# Patient Record
Sex: Male | Born: 1955 | Race: Black or African American | Hispanic: No | Marital: Married | Smoking: Never smoker
Health system: Southern US, Community
[De-identification: ages and names within clinical notes are randomized; demographics above are authoritative.]

## PROBLEM LIST (undated history)

## (undated) DIAGNOSIS — I739 Peripheral vascular disease, unspecified: Secondary | ICD-10-CM

## (undated) DIAGNOSIS — Z9119 Patient's noncompliance with other medical treatment and regimen: Secondary | ICD-10-CM

## (undated) DIAGNOSIS — J189 Pneumonia, unspecified organism: Secondary | ICD-10-CM

## (undated) DIAGNOSIS — M1990 Unspecified osteoarthritis, unspecified site: Secondary | ICD-10-CM

## (undated) DIAGNOSIS — N189 Chronic kidney disease, unspecified: Secondary | ICD-10-CM

## (undated) DIAGNOSIS — E119 Type 2 diabetes mellitus without complications: Secondary | ICD-10-CM

## (undated) DIAGNOSIS — G510 Bell's palsy: Secondary | ICD-10-CM

## (undated) DIAGNOSIS — D649 Anemia, unspecified: Secondary | ICD-10-CM

## (undated) DIAGNOSIS — M069 Rheumatoid arthritis, unspecified: Secondary | ICD-10-CM

## (undated) DIAGNOSIS — Z8673 Personal history of transient ischemic attack (TIA), and cerebral infarction without residual deficits: Secondary | ICD-10-CM

## (undated) DIAGNOSIS — H7202 Central perforation of tympanic membrane, left ear: Secondary | ICD-10-CM

## (undated) DIAGNOSIS — I5032 Chronic diastolic (congestive) heart failure: Secondary | ICD-10-CM

## (undated) DIAGNOSIS — I2510 Atherosclerotic heart disease of native coronary artery without angina pectoris: Secondary | ICD-10-CM

## (undated) DIAGNOSIS — N183 Chronic kidney disease, stage 3 unspecified: Secondary | ICD-10-CM

## (undated) DIAGNOSIS — I10 Essential (primary) hypertension: Secondary | ICD-10-CM

## (undated) DIAGNOSIS — Z8619 Personal history of other infectious and parasitic diseases: Secondary | ICD-10-CM

## (undated) DIAGNOSIS — C801 Malignant (primary) neoplasm, unspecified: Secondary | ICD-10-CM

## (undated) DIAGNOSIS — I420 Dilated cardiomyopathy: Secondary | ICD-10-CM

## (undated) DIAGNOSIS — Z91199 Patient's noncompliance with other medical treatment and regimen due to unspecified reason: Secondary | ICD-10-CM

## (undated) DIAGNOSIS — N401 Enlarged prostate with lower urinary tract symptoms: Secondary | ICD-10-CM

## (undated) DIAGNOSIS — E785 Hyperlipidemia, unspecified: Secondary | ICD-10-CM

## (undated) DIAGNOSIS — Z973 Presence of spectacles and contact lenses: Secondary | ICD-10-CM

## (undated) DIAGNOSIS — I509 Heart failure, unspecified: Secondary | ICD-10-CM

## (undated) DIAGNOSIS — Z9989 Dependence on other enabling machines and devices: Secondary | ICD-10-CM

## (undated) DIAGNOSIS — I259 Chronic ischemic heart disease, unspecified: Secondary | ICD-10-CM

## (undated) DIAGNOSIS — N184 Chronic kidney disease, stage 4 (severe): Secondary | ICD-10-CM

## (undated) DIAGNOSIS — Z8616 Personal history of COVID-19: Secondary | ICD-10-CM

## (undated) HISTORY — DX: Type 2 diabetes mellitus without complications: E11.9

## (undated) HISTORY — DX: Chronic kidney disease, unspecified: N18.9

## (undated) HISTORY — DX: Peripheral vascular disease, unspecified: I73.9

## (undated) HISTORY — DX: Essential (primary) hypertension: I10

## (undated) HISTORY — PX: CARDIAC CATHETERIZATION: SHX172

## (undated) HISTORY — DX: Patient's noncompliance with other medical treatment and regimen: Z91.19

## (undated) HISTORY — DX: Atherosclerotic heart disease of native coronary artery without angina pectoris: I25.10

## (undated) HISTORY — DX: Hyperlipidemia, unspecified: E78.5

## (undated) HISTORY — DX: Patient's noncompliance with other medical treatment and regimen due to unspecified reason: Z91.199

## (undated) HISTORY — DX: Dilated cardiomyopathy: I42.0

---

## 2000-05-30 ENCOUNTER — Emergency Department (HOSPITAL_COMMUNITY): Admission: EM | Admit: 2000-05-30 | Discharge: 2000-05-30 | Payer: Self-pay | Admitting: Emergency Medicine

## 2000-06-07 ENCOUNTER — Encounter: Payer: Self-pay | Admitting: Emergency Medicine

## 2000-06-07 ENCOUNTER — Emergency Department (HOSPITAL_COMMUNITY): Admission: EM | Admit: 2000-06-07 | Discharge: 2000-06-07 | Payer: Self-pay | Admitting: Emergency Medicine

## 2000-06-14 ENCOUNTER — Encounter: Payer: Self-pay | Admitting: Orthopedic Surgery

## 2000-06-14 ENCOUNTER — Ambulatory Visit (HOSPITAL_COMMUNITY): Admission: RE | Admit: 2000-06-14 | Discharge: 2000-06-14 | Payer: Self-pay | Admitting: Orthopedic Surgery

## 2000-06-22 ENCOUNTER — Encounter: Payer: Self-pay | Admitting: Orthopedic Surgery

## 2000-06-22 ENCOUNTER — Ambulatory Visit (HOSPITAL_COMMUNITY): Admission: RE | Admit: 2000-06-22 | Discharge: 2000-06-22 | Payer: Self-pay | Admitting: Orthopedic Surgery

## 2000-07-09 ENCOUNTER — Ambulatory Visit (HOSPITAL_COMMUNITY): Admission: RE | Admit: 2000-07-09 | Discharge: 2000-07-09 | Payer: Self-pay | Admitting: Internal Medicine

## 2000-07-13 HISTORY — PX: CORONARY ARTERY BYPASS GRAFT: SHX141

## 2000-11-27 ENCOUNTER — Inpatient Hospital Stay (HOSPITAL_COMMUNITY): Admission: RE | Admit: 2000-11-27 | Discharge: 2000-12-05 | Payer: Self-pay | Admitting: Cardiology

## 2000-11-27 HISTORY — PX: CARDIAC CATHETERIZATION: SHX172

## 2000-11-29 ENCOUNTER — Encounter: Payer: Self-pay | Admitting: Cardiothoracic Surgery

## 2000-11-30 ENCOUNTER — Encounter: Payer: Self-pay | Admitting: Cardiothoracic Surgery

## 2000-11-30 HISTORY — PX: CORONARY ARTERY BYPASS GRAFT: SHX141

## 2000-12-01 ENCOUNTER — Encounter: Payer: Self-pay | Admitting: Cardiothoracic Surgery

## 2000-12-02 ENCOUNTER — Encounter: Payer: Self-pay | Admitting: Cardiothoracic Surgery

## 2000-12-03 ENCOUNTER — Encounter: Payer: Self-pay | Admitting: Cardiothoracic Surgery

## 2000-12-28 ENCOUNTER — Encounter (HOSPITAL_COMMUNITY): Admission: RE | Admit: 2000-12-28 | Discharge: 2001-03-28 | Payer: Self-pay | Admitting: Cardiology

## 2002-05-21 LAB — TESTOSTERONE: Testosterone: -10

## 2004-11-13 ENCOUNTER — Inpatient Hospital Stay (HOSPITAL_COMMUNITY): Admission: EM | Admit: 2004-11-13 | Discharge: 2004-11-18 | Payer: Self-pay | Admitting: Emergency Medicine

## 2004-11-14 ENCOUNTER — Encounter: Payer: Self-pay | Admitting: Cardiology

## 2004-11-21 ENCOUNTER — Encounter: Admission: RE | Admit: 2004-11-21 | Discharge: 2004-11-21 | Payer: Self-pay | Admitting: Cardiology

## 2004-11-24 ENCOUNTER — Encounter: Admission: RE | Admit: 2004-11-24 | Discharge: 2004-11-24 | Payer: Self-pay | Admitting: Cardiology

## 2005-03-19 ENCOUNTER — Ambulatory Visit (HOSPITAL_COMMUNITY): Admission: RE | Admit: 2005-03-19 | Discharge: 2005-03-19 | Payer: Self-pay | Admitting: Cardiology

## 2005-04-21 ENCOUNTER — Ambulatory Visit (HOSPITAL_COMMUNITY): Admission: RE | Admit: 2005-04-21 | Discharge: 2005-04-21 | Payer: Self-pay | Admitting: Cardiology

## 2005-04-21 HISTORY — PX: CARDIAC CATHETERIZATION: SHX172

## 2010-04-09 ENCOUNTER — Ambulatory Visit: Payer: Self-pay | Admitting: Cardiology

## 2010-09-24 ENCOUNTER — Encounter: Payer: Self-pay | Admitting: Cardiology

## 2010-09-24 DIAGNOSIS — N189 Chronic kidney disease, unspecified: Secondary | ICD-10-CM | POA: Insufficient documentation

## 2010-09-24 DIAGNOSIS — I10 Essential (primary) hypertension: Secondary | ICD-10-CM | POA: Insufficient documentation

## 2010-09-24 DIAGNOSIS — I739 Peripheral vascular disease, unspecified: Secondary | ICD-10-CM | POA: Insufficient documentation

## 2010-09-24 DIAGNOSIS — I2510 Atherosclerotic heart disease of native coronary artery without angina pectoris: Secondary | ICD-10-CM | POA: Insufficient documentation

## 2010-09-24 DIAGNOSIS — I420 Dilated cardiomyopathy: Secondary | ICD-10-CM | POA: Insufficient documentation

## 2010-10-14 ENCOUNTER — Ambulatory Visit: Payer: Self-pay | Admitting: Cardiology

## 2010-10-31 ENCOUNTER — Encounter: Payer: Self-pay | Admitting: Cardiology

## 2010-11-05 ENCOUNTER — Ambulatory Visit: Payer: Self-pay | Admitting: Cardiology

## 2010-11-28 NOTE — H&P (Signed)
[place]. [facility]  Patient:    [name], [name]                      MRN: [id number] Adm. Date:  11/26/00 Attending:  Peter M. Martinique, M.D. CC:         Linton Ham. Laney Pastor, M.D.                         History and Physical  DATE OF BIRTH:  1956-05-02  HISTORY OF PRESENT ILLNESS:  Mr. Popescu is a 55 year old black male with a history of severe hypertension. He has recently experienced symptoms of shortness of breath if he goes up a flight of stairs, but denies any orthopnea or edema. He had been started on Zestril and later Toprol XL was added. He has no history of diabetes, no prior history of tobacco use. He does eat a high salt diet. He underwent evaluation with an echocardiogram which demonstrated mild left ventricular dysfunction with ejection fraction approximately 45 to 50%. There was hypokinesia in the mid to distal anterior wall and basal to mid anterior wall, as well as the anterior septum. Subsequently, the patient underwent evaluation with a stress Cardiolite study. He was able to walk 9 minutes on the Bruce protocol. He had 1 to 2 mm of ST depression in leads V4 through V6 consistent with inferolateral ischemia. Subsequent SPECT images demonstrated a moderately large defect involving the anterolateral wall in the apex consistent with ischemia. Ejection fraction was calculated at 45%. Because of these findings cardiac catheterization has been recommended and he is now admitted for that procedure.  PAST MEDICAL HISTORY: 1. Hypertension. 2. Status post ruptured disk in his lumbar spine.  PAST SURGICAL HISTORY:  None.  ALLERGIES:  No known allergies.  CURRENT MEDICATIONS: 1. Hydrocodone p.r.n. 2. Toprol XL 50 mg per day. 3. Zestoretic 20/12.5 mg daily.  SOCIAL HISTORY:  The patient works as a Cabin crew. He is married and has one child. He denies tobacco or alcohol use.  FAMILY HISTORY:  Father is age 13 and has  hypertension. Mother died at age 26 of diabetes. He has two sisters in good health.  REVIEW OF SYSTEMS:  Otherwise, unremarkable.  PHYSICAL EXAMINATION:  GENERAL:  The patient is an overweight black male in no apparent distress.  VITAL SIGNS:  Blood pressure 170/100, pulse 104 and regular.  HEENT:  Pupils equal, round and reactive to light and accommodation. Extraocular movements clear. Funduscopic exam reveals sharp disks with mild arteriolar nicking. Oropharynx is clear.  NECK:  Without JVD, adenopathy, thyromegaly or bruits.  LUNGS:  Clear.  CARDIAC:  Regular rate and rhythm without S3. Positive S4. There is also an early systolic ejection murmur at the left sternal border.  ABDOMEN:  Obese, soft, nontender. No masses or bruits.  EXTREMITIES:  Without edema. Pulses are 2+ and symmetric.  NEUROLOGIC:  Nonfocal.  ECG at rest shows normal sinus rhythm with nonspecific T wave abnormality.  IMPRESSION: 1. Abnormal stress Cardiolite study demonstrating moderately large defect in    the anterolateral wall distribution. This coincides with resting wall    motion abnormality noted on echocardiogram. This is consistent with    significant ischemic heart disease. 2. Mild left ventricular dysfunction. 3. Severe hypertension.  PLAN:  The patient will be admitted for cardiac catheterization with further therapy pending these results. DD:  11/25/00 TD:  11/26/00 Job: [id number]

## 2010-11-28 NOTE — Discharge Summary (Signed)
[place]  Patient:    [name], [name]                    MRN: [id number] Adm. Date:  [id number] Disc. Date: [id number] Attending:  Len Childs Dictator:   Lestine Box, R.N.F.A. CC:         Robert P. Laney Pastor, M.D.  Peter M. Martinique, M.D.   Discharge Summary  DATE OF BIRTH:  June 29, 1956  DATE OF SURGERY:  Nov 30, 2000.  ADMISSION DIAGNOSIS:  Abnormal Cardiolyte stress test.  PAST MEDICAL HISTORY: 1. Hypertension. 2. Obesity. 3. Herniated lumbar disk in November 2001, treated medically. 4. Peripheral vascular disease, bilateral femoral artery occlusion.  ALLERGIES:  No known drug allergies.  DISCHARGE DIAGNOSES: 1. Severe three-vessel coronary artery disease, class 3 progressive angina,    status post coronary artery bypass grafting. 2. Postoperative pericarditis, resolved.  HOSPITAL COURSE/PROCEDURES:  [name] is a 55 year old black man who on Nov 26, 2000, was admitted to [facility] and Dr. Peter M. Martinique, after an elective cardiac catheterization.  [name] was initially evaluated for a history of severe hypertension with shortness of breath, and had an echocardiogram which showed some abnormal left ventricular wall motion. He subsequently underwent a stress Cardiolyte scan which showed ischemia of the anteroapical region.  This was performed on April 26th.  He was scheduled and admitted to the hospital on Nov 26, 2000, for his cardiac catheterization. The catheterization revealed severe three-vessel coronary artery disease, including a 99% ostial LAD lesion, with an ejection fraction of approximately 50%, with anteroapical hypokinesia.  A cardiac surgery consultation was obtained with Dr. Tharon Aquas Trigt III, who recommended a coronary artery bypass grafting for the treatment choice in this gentleman.  On Nov 29, 2000, Doppler studies were performed which revealed no significant  carotid artery disease, and he was noted to have normal ABIs.  On Nov 30, 2000, [name] underwent an uncomplicated coronary artery bypass grafting x 4 with the right radial artery harvest.  The grafts placed at the time of the procedure were right radial artery grafted to the first obtuse marginal, saphenous vein was grafted to the right coronary artery, saphenous vein was grafted to the fourth obtuse marginal, and a left internal mammary artery was grafted to the diagonal artery.  At the conclusion of the procedure he was transferred in stable condition to the SICU.  His postoperative course has been notable for some pericarditis diagnosed by a friction rub noted on physical examination.  This was improved by postoperative day two.  He was also noted to have two short runs of nonsustained ventricular tachycardia, one 10 beats, and one 7 beats on the afternoon on Dec 04, 2000.  His blood pressure remained stable and he was asymptomatic.  The potassium and magnesium levels were checked.  He had no further arrhythmias overnight.  His potassium level was slightly decreased at 3.6, and he was given potassium supplements today on May 26th.  DISPOSITION:  [name] has remained stable and has made very good progress since his surgery, and he was discharged today on Dec 05, 2000.  CONDITION ON DISCHARGE:  Improved.  DISCHARGE INSTRUCTIONS:  Include medications, diet, activity, wound care, and follow-up appointments.  Please see the discharge instruction sheet for details.  DISCHARGE MEDICATIONS: 1. Tylox one to two p.o. q.4-6h. p.r.n. 2. Enteric-coated aspirin 325 mg p.o. q.d. 3. Imdur 30 mg p.o. q.d. 4. Potassium chloride  20 mEq one today. 5. He has been instructed to resume his home medications including    Toprol XL 50 mg p.o. p.o. q.d. 6. Zestril 10 mg p.o. q.d.  FOLLOWUP:  He has been instructed to call Dr. Morrison Old office and arrange for an appointment in approximately two  weeks, and to have a chest x-ray taken at that time.  He will also have an appointment at Dr. Corliss Parish office in approximately three weeks.  The office will call with a date and time for that appointment. DD:  12/05/00 TD:  12/06/00 Job: [id number]

## 2010-11-28 NOTE — Cardiovascular Report (Signed)
NAMELAVERNE, SERIGHT              ACCOUNT NO.:  [id number]   MEDICAL RECORD NO.:  [id number]          PATIENT TYPE:  OIB   LOCATION:  2899                         FACILITY:  [facility]   PHYSICIAN:  Peter M. Martinique, M.D.  DATE OF BIRTH:  12/14/1955   DATE OF PROCEDURE:  04/21/2005  DATE OF DISCHARGE:  04/21/2005                              CARDIAC CATHETERIZATION   PROCEDURES:  Left heart catheterization, coronary and left ventricular  angiography, saphenous vein graft angiography x3, and LIMA graft  angiography.   INDICATION FOR PROCEDURE:  Mr. Stetzer is a 55 year old black male who is  status post coronary bypass surgery in 2002. He subsequently presented four  years later with acute congestive heart failure and severe left ventricular  dysfunction. He had a cardiac CT angiography to evaluate his graft status.  This indicated occlusion of the radial graft to the first marginal branch  and occlusion of the LIMA graft to the LAD. There also appeared to be severe  diffuse disease in the vein graft to the PDA. Cardiac catheterization was  recommended to evaluate his potential for revascularization.   Access was via the left brachial approach. The patient has known bilateral  iliac occlusions. There were no complications. All catheter exchanges were  done over an exchange wire.   CONTRAST:  210 cc of Visipaque.   MEDICATIONS:  Versed 2 milligrams IV and heparin 3000 units via the left  brachial sheath.   HEMODYNAMIC DATA:  Aortic pressure was 131/72 with a mean of 97 mmHg. Left  ventricular pressure 124 with EDP of 8 mmHg.   CATHETERS USED:  A 6-French brachial sheath was used. The left coronary was  engaged using a Castelli catheter. The right coronary was engaged using a JR-  4 catheter. The vein grafts were all engaged with a left Amplatz one  catheter and the LIMA graft was visualized using LIMA catheter. We also used  a pigtail catheter for ventriculography.   ANGIOGRAPHIC  DATA:  The left coronary arises normally. The left main  coronary is without significant disease. The left anterior descending artery  is occluded after the takeoff of the first diagonal branch. The ostial LAD  has a 90-95% stenosis. The first diagonal branch is moderate in size and  without significant disease.   The left circumflex coronary supplies the lateral wall with four marginal  vessels small to moderate size. There is diffuse disease in the mid  circumflex between the second and third marginal vessels up to 70%. There is  no significant disease proximally with excellent filling of the first and  second marginal vessels. There is competitive filling of the third marginal  vessel from the vein graft. The fourth marginal vessel fills completely via  the vein graft.   The right coronary arises and distributes normally. There is no significant  obstructive disease. The PDA is seen to fill competitively by vein graft.   The saphenous vein graft to the PDA is patent. This graft is very small in  caliber throughout its course. There appears to be some plaque in the  proximal graft. Compared to  the remainder of the graft, this is only 30-40%  narrowing but again the entire graft was significantly small in caliber.  There is excellent runoff into the PDA.   The saphenous vein graft to the fourth marginal vessel was widely patent  without significant disease and has excellent runoff including supplying the  third obtuse marginal vessel.   The radial artery graft to the first marginal vessel was occluded  proximally. However, the first marginal vessel does fill well by the native  vessel.   The LIMA graft is not directly engaged; however, excellent flush shots were  obtained. There is no significant subclavian stenosis. The LIMA graft is  very small in caliber and atretic essentially with no flow past the mid  graft.   The left ventricular angiography was performed in the RAO view.  This  demonstrates upper normal left ventricular size. There is global hypokinesia  with more moderate to severe hypokinesia of the anterior wall. Ejection  fraction is estimated at 30-35%. There is no significant mitral  insufficiency.   FINAL INTERPRETATION:  1.  Severe three-vessel obstructive atherosclerotic coronary disease.  2.  Patent saphenous vein graft to the first large obtuse marginal vessel.  3.  Patent saphenous vein graft to the posterior descending artery with very      small caliber graft throughout.  4.  Occluded radial graft to the first marginal vessel.  5.  Atretic left internal mammary artery graft to the diagonal.  6.  Moderate to severe left ventricular dysfunction.   PLAN:  The PDA of the right coronary has dual blood supply both from the  small caliber vein graft and the native vessel. This area does not appear to  be in jeopardy. The proximal and mid circumflex distribution are well  supplied by the native vessel and the distal circumflex including the fourth  and third marginal vessels are supplied by the functioning vein graft. The  LAD is chronic and totally occluded after the diagonal. Therefore the only  territory at risk at this point is the diagonal. There is a high-grade  ostial LAD stenosis obtunding this vessel. This lesion, I think, high risk  for catheter-based intervention given its ostial location and the potential  for compromise of the circumflex. I would recommend continued aggressive  medical therapy at this point, especially since the patient does not have  any active angina.           ______________________________  Peter M. Martinique, M.D.     PMJ/MEDQ  D:  04/21/2005  T:  04/21/2005  Job:  [id number]

## 2010-11-28 NOTE — Cardiovascular Report (Signed)
[place]. [facility]  Patient:    [name], [name]                    MRN: [id number] Proc. Date: 11/27/00 Adm. Date:  [id number] Attending:  Martinique, Peter Manning CC:         Len Childs, M.D.  Robert P. Laney Pastor, M.D.   Cardiac Catheterization  INDICATIONS FOR PROCEDURE:  The patient is a 55 year old, black male, who presents with severe hypertension.  He has a markedly abnormal stress Cardiolite study demonstrating an anterolateral and apical ischemia.  EQUIPMENT USED:  A 6 French brachial artery sheath, 6 [place] JR4 catheter, 6 [place] Castillo II catheter, 6 French pigtail catheter.  MEDICATIONS:  Local anesthesia with 1% Xylocaine, Versed a total of 3 mg IV.  DESCRIPTION OF PROCEDURE:  Access was initially attempted via the right groin. However, after experiencing difficulty advancing the wire we switched to the left groin for access.  We did obtain arterial access but had difficulty advancing the J wire.  Using a Glidewire we were able to access up into the thoracic aorta.  However, we were unable to advance the catheter over the wire.  Angiography at the site of arterial puncture demonstrated occlusion of the femoral artery with collateral flow.  We then initially attempted the right groin again but experienced a similar problem there.  Again, local angiography in the right groin demonstrated occlusion of the right femoral artery with collaterals.  We then changed to a brachial approach using the right brachial artery.  Access was obtained without difficulty.  It was difficult because the right subclavian and innominate artery inserted into the aorta in the left side of the arch.  It was difficult to obtain imaging of the coronary arteries.  We were able to access the left coronary artery using [facility] II catheter.  The left main coronary artery was short without significant  The left anterior descending artery had a 99% ostial  stenosis.  The LAD appeared to be a dual system.  One branch gave rise to a diagonal and septal perforator branch.  This vessel was of moderate size and appeared graftable. The second branch of the LAD was occluded proximally.  There was only very faint collaterals to the septal perforators from the right coronary artery. It is unclear whether this vessel is large enough to graft.  Left circumflex coronary artery was a large vessel.  It gave rise to multiple marginal vessels.  The first marginal vessel had an 80% stenosis proximally. The distal circumflex had 30% narrowing.  The right coronary artery arises and distributes normally.  It is a codominant vessel with a 60-70% stenosis in the proximal vessel.  LEFT VENTRICULAR ANGIOGRAPHY:  Left ventricular angiography demonstrates normal left ventricular size with mild anterior hypokinesia.  Ejection fraction is calculated at 53%.  There is no significant mitral insufficiency.  ABDOMINAL AORTOGRAPHY:  Abdominal aortography demonstrates normal aortic size. There is mild calcification.  Arterial flow to the mesenteric vessels and renal artery appears normal.  The iliac vessels appear normal.  Faint late filling of the femoral arteries is visible.  FINAL INTERPRETATION: 1. Severe three-vessel obstructive atherosclerotic coronary artery disease. 2. Overall good left ventricular function with anterolateral hypokinesia. 3. Peripheral vascular disease with bilateral femoral artery occlusions.  PLAN:  Would recommend coronary artery bypass surgery. DD:  11/27/00 TD:  11/27/00 Job: [id number]

## 2010-11-28 NOTE — Consult Note (Signed)
[place]. [facility]  Patient:    [name], [name]                    MRN: [id number] Proc. Date: 11/26/00 Adm. Date:  [id number] Attending:  Martinique, Peter Manning CC:         [facility]; Attention:  Peter M. Martinique, M.D.   Consultation Report  REASON FOR CONSULTATION:  Severe three-vessel coronary disease, abnormal Cardiolite scan.  PHYSICIAN REQUESTING CONSULTATION:  Peter M. Martinique, M.D.  PRIMARY CARE PHYSICIAN:  Robert P. Laney Pastor, M.D.  CHIEF COMPLAINT:  Shortness of breath.  HISTORY OF PRESENT ILLNESS:  I was asked to see this 55 year old black male in consultation by Dr. Peter Martinique for evaluation of severe three-vessel coronary disease.  Patient was initially evaluated for history of severe hypertension with shortness of breath and had an echo which showed some abnormal left ventricular wall motion.  He subsequently underwent a stress Cardiolite scan which showed ischemia of the anteroapical region; this was performed on April 26th.  Patient was scheduled for outpatient cardiac catheterization by Dr. Peter Martinique, which was performed yesterday.  This showed a 99% ostial LAD stenosis with a tight high-grade lesion of the diagonal as well and 80% proximal stenosis of a large obtuse marginal and 80% stenosis of the proximal right coronary artery.  Overall ejection fraction was 50% with some anteroapical hypokinesia.  Left ventricular end diastolic pressure was 13 mmHg.  The catheterization was performed via the right brachial artery due to bilateral thermal artery occlusion.  Due to the patients severe three-vessel coronary artery disease and ostial LAD disease, he was referred for surgical coronary revascularization.  The patient is currently stable in the hospital.  PAST MEDICAL HISTORY: 1. Hypertension. 2. Obesity. 3. Herniated lumbar disk, treated conservatively, November 2001. 4. Peripheral vascular disease with bilateral  femoral artery occlusion.  CURRENT MEDICATIONS: 1. Toprol-XL 50 mg p.o. q.d. 2. Zestril 20 mg p.o. q.d.  ALLERGIES:  None.  SURGICAL HISTORY:  None.  SOCIAL HISTORY:  The patient is married and lives with his wife and has a 62 year old daughter.  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  He denies smoking or alcohol use.  FAMILY HISTORY:  The patients aunt died of a myocardial infarction.  His mother had diabetes and both parents had hypertension.  There is no history of premature coronary artery disease in the family.  Patient does not know of any cholesterol or lipid abnormalities in his family.  REVIEW OF SYSTEMS:  The patients weight has been stable.  He denies any upper respiratory infections or symptoms in the past several weeks.  He denies any significant trauma or fractures.  He is not a free bleeder or does he have problems with easy bruisability.  He has had no symptoms of TIA, CVA, DVT or true claudication.  He had left thigh pain related to the herniated disk in the lumbar region which was treated with an apparent steroid injection after an MRI.  He denies any blood per rectum, hemoptysis, asthma or pneumonia.  He denies any skin lesions or chronic skin conditions.  Review of systems is otherwise negative.  PHYSICAL EXAMINATION:  VITAL SIGNS:  He is 5 feet 9 inches and weighs 210 pounds.  Blood pressure is 140/70, pulse 80 and regular, respirations 18 and unlabored.  GENERAL:  General appearance is that of a well-developed, obese black male in his hospital room in no  distress.  HEENT:  Normocephalic.  Full EOMs.  Dentition under good repair.  Pharynx clear.  NECK:  Supple without JVD, thyromegaly, carotid bruit or mass.  LYMPHATICS:  Lymph nodes reveal no palpable adenopathy of the cervical, supraclavicular or axillary regions.  CHEST:  Thoracic exam reveals clear breath sounds bilaterally without  thoracic deformity.  CARDIAC:  Regular rate and rhythm without S3, gallop or murmur.  ABDOMEN:  Soft, obese, nontender, without bruit.  EXTREMITIES:  Bilateral groin puncture sites and right brachial artery puncture site and there is no evidence of hematoma at any of these three access sites for the catheterization.  He has no evidence of chronic venous insufficiency or edema or tenderness of his extremities.  He has some clubbing of the distal phalanges of his thumbs bilaterally.  VASCULAR:  Exam reveals 2+ pulses in each radial and pedal regions.  I do not feel femoral pulses.  NEUROLOGIC:  He is alert and oriented x 3 with full motor function.  He is a left-hand-dominant individual.  SKIN:  Clear, warm and dry without rash or lesion.  RECTAL:  Exam is deferred.  LABORATORY DATA:  Vascular lab studies are pending.  His creatinine following catheterization is 1.0.  His pre-catheterization hematocrit was 38%.  A chest x-ray is pending.  IMPRESSION AND PLAN:  [age]-year-old male with severe coronary disease and an ischemic response to a stress Cardiolite.  He would benefit from coronary revascularization with respect to preservation of ventricular function and improved survival.  Surgery will be scheduled for Tuesday, May 21st, at which time mammary artery grafting will be planned to the left anterior descending, radial artery grafting to the large obtuse marginal #1 and vein grafts to the diagonal and right coronary.  I discussed the indications and benefits of the operation with the patient and his wife.  I discussed the major aspects of the operation including the choice of conduit, the location of the surgical incisions, use of general anesthesia and cardiopulmonary bypass and the expected recovery period.  I reviewed the risks of myocardial infarction, cerebrovascular accident, bleeding, infection and death of the operation with the patient.  I discussed the  alternatives to surgical therapy for his coronary disease.  He agreed to proceed with the  operation as planned under informed consent.  Thank you very much for this consultation. DD:  11/27/00 TD:  11/27/00 Job: [id number]

## 2010-11-28 NOTE — H&P (Signed)
[name], [name] NO.:  [id number]   MEDICAL RECORD NO.:  [id number]          PATIENT TYPE:  OIB   LOCATION:  NA                           FACILITY:  [facility]   PHYSICIAN:  Peter M. Martinique, M.D.  DATE OF BIRTH:  [birth year]   DATE OF ADMISSION:  DATE OF DISCHARGE:                                HISTORY & PHYSICAL   DATE OF ADMISSION:  April 21, 2005   HISTORY OF PRESENT ILLNESS:  [name] is a 55 year old black male who is  admitted for cardiac catheterization. He has a known history of coronary  artery disease and underwent coronary artery bypass surgery x4 in 2002 by  Dr. Tharon Aquas Trigt. He had subsequently been lost to follow-up. He  presented in May 2006 with acute congestive heart failure and hypertensive  emergency. At that time he was found to have severe left ventricular  dysfunction with ejection fraction estimated at 15-25%. He was treated with  aggressive blood pressure control and medical therapy. His congestive heart  failure resolved. His clinical symptoms improved. His blood pressure control  improved. The question remains whether or not his dilated cardiomyopathy was  ischemic versus nonischemic, given his history of coronary disease. He  subsequently underwent cardiac CT angiography on March 19, 2005. This  demonstrated severe graft disease. The left IMA graft was occluded. The  radial graft to the obtuse marginal vessel also appeared to be occluded.  There was a vein graft to what appeared to be a fourth marginal branch that  was patent that did have some modest plaque in the mid vein graft. The vein  graft to the distal right coronary artery was patent but appeared to be a  very small caliber graft that was diffusely diseased throughout. Given these  findings, we recommended he undergo cardiac catheterization to see if he is  a candidate for any further revascularization. It was noted during his  initial operative report that his  targets were extremely poor. There is also  intramyocardial course of some of the vessels and it was noted in his  operative note that he would be a poor candidate for redo bypass surgery. It  also was noted on his cardiac CTA that there appeared to be critical  stenosis of the ostium of the LAD and this is a potential target for  revascularization. The patient is now admitted for cardiac catheterization.  This evaluation was further complicated by the fact that the patient has  bilateral external iliac occlusions. His distal vessels are reconstituted by  excellent collaterals from the hypogastric vessels. However, because he has  a LIMA graft and occlusion of his iliacs, he will need to be catheterized  via a left brachial approach.   PAST MEDICAL HISTORY:  1.  ASCAD status post CABG x4 in 2002. This included an LIMA graft to the      diagonal branch of the LAD, a radial artery graft to the first obtuse      marginal vessel, a saphenous vein graft to the fourth obtuse marginal,      and a saphenous vein  graft to the right coronary artery.  2.  Dilated cardiomyopathy. Initial EF on diagnosis was 15-25%. By more      recent CTA findings, his ejection fraction had improved to 50% with      hypokinesia of the anterior wall.  3.  Severe hypertension.  4.  Hyperlipidemia.   He has no known allergies.   CURRENT MEDICATIONS:  1.  Coreg 25 mg b.i.d.  2.  Furosemide 40 mg per day.  3.  Zocor 20 mg per day.  4.  Aspirin daily.  5.  Altace 10 mg per day.  6.  Norvasc 10 mg per day.  7.  Spironolactone 25 mg per day.  8.  Potassium 20 mEq b.i.d.   SOCIAL HISTORY:  The patient is married and lives in [place]. He is a  Furniture conservator/restorer. He denies tobacco or alcohol use. He has no drug use. He has one  child.   FAMILY HISTORY:  Father is age 66 and has hypertension. Mother died of a  diabetic coma at age 84. Two sisters are alive and well.   REVIEW OF SYSTEMS:  The patient currently denies any  dyspnea, chest pain,  orthopnea, PND, or increased edema. He has had no headache or dizziness.  Denies any history of TIA or stroke. He has had no claudication symptoms.  Appetite has been good. He has no bowel or bladder complaints. All other  review of systems are negative.   PHYSICAL EXAMINATION:  GENERAL:  The patient is a black male of short  stature.  VITAL SIGNS:  His weight is 200, blood pressure is 160/80, pulse 64 and  regular, respirations are normal.  HEENT:  His pupils are equal, round, and reactive to light and  accommodation. Extraocular movements are full. He does wear glasses. His  oropharynx is clear.  NECK:  Thick and short. He has no jugular venous distention or bruits, no  adenopathy or thyromegaly.  LUNGS:  Clear to auscultation and percussion.  CARDIAC:  Reveals regular rate and rhythm without gallop, murmur, rub, or  click.  ABDOMEN:  Soft, nontender, without masses or hepatosplenomegaly.  EXTREMITIES:  He has no significant lower extremity edema. Pulses are 1+ and  symmetric in the lower extremities, 2+ and symmetric in the upper  extremities.  NEUROLOGIC:  Nonfocal.   LABORATORY DATA:  Chest x-ray dated March 24, 2005, shows no acute  process. Prior ECG shows sinus tachycardia with PVCs and left axis  deviation, cannot rule out septal infarct age undetermined.   IMPRESSION:  1.  Atherosclerotic cardiovascular disease status post coronary artery      bypassing grafting in 2002. Recent cardiac CTA is consistent with      multiple graft failures.  2.  Dilated cardiomyopathy secondary to severe hypertension and ischemic      heart disease, improved.  3.  Hypertension.  4.  Hyperlipidemia.   PLAN:  Will proceed with cardiac catheterization via left brachial approach.  This will help determine whether the patient is a candidate for any  catheterization revascularization. It has already been determined that revascularization with another bypass operation  would be a poor choice.           ______________________________  Peter M. Martinique, M.D.     PMJ/MEDQ  D:  04/20/2005  T:  04/20/2005  Job:  [id number]

## 2010-11-28 NOTE — H&P (Signed)
NAMESEELEY, LETTER             ACCOUNT NO.:  [id number]   MEDICAL RECORD NO.:  [id number]          PATIENT TYPE:  INP   LOCATION:  1826                         FACILITY:  [facility]   PHYSICIAN:  Ron Parker, M.D.DATE OF BIRTH:  08-11-1955   DATE OF ADMISSION:  11/13/2004  DATE OF DISCHARGE:                                HISTORY & PHYSICAL   PRIMARY CARE PHYSICIAN:  Unassigned.   CARDIOLOGIST:  Dr. Peter Martinique (last seen over 3 years ago).   HISTORY OF PRESENT ILLNESS:  This is a 55 year old African-American male who  presents with a past medical history of coronary artery disease status post  CABG, and hyperlipidemia (noncompliant with medications and follow-up) who  presents with a 1- to 2-week history of increased peripheral edema and  worsening shortness of breath with exertion. The patient denies any chest  pain, nausea, or vomiting. No complaints of fevers, chills, headache, or  blurred vision, Wife states that he does have apneic spells at night with  difficulty breathing while he is on his back. The patient denies any  palpitations, cough, or congestion. Otherwise, he states that he has not  taken his medications in over 3 or 4 years because he has felt fine. He has  also not had continued follow-up because of the same reason.   PAST MEDICAL HISTORY:  1.  Hypertension.  2.  Coronary artery disease status post CABG in 2002 (cannot identify      cardiac surgeon).  3.  Hyperlipidemia.   MEDICATIONS:  Wife remembers he was on Lipitor and blood pressure  medications.   ALLERGIES:  None.   FAMILY HISTORY:  Positive for father who is living at age 80 with  hypertension. Mother passed away from a diabetic coma at the age of 50. He  has two sisters with no known medical issues. He has one daughter with no  known medical issues. She is 19.   SOCIAL HISTORY:  The patient is married, lives in [place]. Two sisters,  one child as above. He is a Furniture conservator/restorer. Denies  tobacco, alcohol, or drug use.   REVIEW OF SYSTEMS:  Increased weight change and abdominal girth. No increase  in his appetite or increased p.o. intake. No nausea or vomiting. No  constipation or diarrhea. No melanotic stools, no hematochezia, no problems  with urination. Not noticed any blood in his urine. No back pain, no  shortness of breath other than those in the HPI. Denies any headaches,  blurred vision. Has been taking ibuprofen and aspirin in the last 2 weeks  with worsening peripheral edema and some joint pain, mostly in both knees.  Otherwise, review of systems is negative.   PHYSICAL EXAMINATION:  VITAL SIGNS:  Temperature is 97.9. Blood pressure  initially 220/132 (placed on nitroglycerin drip and given IV Lasix), down to  176/105. Pulse of 98, respirations 24, 98% on 2 L O2.  GENERAL:  This is an obese African-American male sitting upright in his bed  in no acute distress.  HEENT:  Pupils equal, round, and react to light. Extraocular movements are  intact. Moist mucous membranes. Posterior pharynx  is clear.  NECK:  Thick, unable to appreciate JVP. No bruits, no masses, no  lymphadenopathy.  CARDIOVASCULAR:  Regular rate, no rhythm abnormalities. No murmurs, rubs, or  gallops. He has a well-healed midline scar.  CHEST:  Shows decreased breath sounds approximately one-third up both lung  fields with rales. No wheezing. No accessory muscle use. He also has  dullness to percussion approximately one-third up both lung fields as well.  No egophony.  ABDOMEN:  Appreciable fluid wave, bowel sounds present and nontender. No  hepatosplenomegaly, no HJR, no flank tenderness.  EXTREMITIES:  He has 2+ pitting edema approximately to his knees  bilaterally. Pulses are diminished in his lower extremities but symmetric.  Upper extremity pulses are 2+ bilaterally.  NEUROLOGIC:  Cranial nerve examination without any focal deficits. No motor  or sensation deficits. He is alert and oriented  x3.   LABORATORY DATA:  Chest x-ray shows evidence of CHF as read by the  radiologist. He has borderline cardiomegaly interpreted by myself. EKG  initially on admission shows sinus tachycardia with occasional PVCs, poor R  wave progression, possible septal infarct, normal axis, significant left  atrial enlargement, no ST-T changes consistent with acute ischemia or  infarct, intervals are normal. Of note, compared to EKG from 2002, no  significant change other than the sinus tachycardia.   Hemoglobin 13.9, white count of 7.7, platelets of 255.   Sodium 140, potassium 3.3, chloride of 108, bicarb 28, glucose of 155, BUN  16, creatinine 1.4. LFTs were essentially normal except for total bili  slightly elevated at 2.1. BNP is also elevated at 576. Point-of-care markers  x1 negative. UA negative.   ASSESSMENT AND PLAN:  1.  Hypertensive crisis.  2.  Congestive heart failure.  3.  Coronary artery disease status post coronary artery bypass grafting.  4.  Hyperlipidemia.  5.  Hypokalemia.  6.  Deep venous thrombosis/gastrointestinal prophylaxis.   Plan is to admit to telemetry bed, cycle two sets of enzymes. Will also  obtain a 2-D echocardiogram, fasting lipid profile, and a TSH level. Will  replete his potassium, continue his diuresis, and initiate p.o. blood  pressure control medications. Daily aspirin and titrate his O2 to keep his  saturations above 95%. Repeat a chest x-ray in the morning after diuresis  overnight and meanwhile place him on strict I's and O's and daily weights to  monitor his CHF status. Place him on a low sodium, low calorie/cholesterol  diet. Check an A1c for his elevated hyperglycemia and place him on sliding  scale insulin and determine further if he needs to be medicated for  diabetes. Currently he would not  have the diagnosis of diabetes since his random is less than 200. In the meantime will obtain records from Dr. Doug Sou office to compare with  results  and tests obtained during this hospitalization. Further plan to be  determined after exams and laboratory tests return.      JD/MEDQ  D:  11/13/2004  T:  11/13/2004  Job:  [id number]   cc:   Peter M. Martinique, M.D.  [address], [place], [place] [address]  Fax: [phone]

## 2010-11-28 NOTE — Consult Note (Signed)
[name], CLUSTER             ACCOUNT NO.:  [id number]   MEDICAL RECORD NO.:  [id number]          PATIENT TYPE:  INP   LOCATION:  2021                         FACILITY:  [facility]   PHYSICIAN:  [name], M.D. DATE OF BIRTH:  08/21/1955   DATE OF CONSULTATION:  11/16/2004  DATE OF DISCHARGE:                                   CONSULTATION   HISTORY OF PRESENT ILLNESS:  This is a 55 year old African-American married  gentleman who was admitted on Nov 13, 2004, in the [facility]  because of uncontrolled hypertension and severe fluid retention with  shortness of breath.  The patient does have a history of known coronary  artery disease.  He had coronary artery bypass graft surgery in 2002.  He  has had essentially no medical follow-up and has not been on any medication  for the past 2 to 3 years.  He stopped taking his medicine because he felt  well and did not think he needed any medicine.  His wife recalls that in the  past, he had been on Lipitor and a blood pressure pill.  The patient denies  any chest pain, but has had some increasing dyspnea as well as increasing  weight gain secondary to fluid retention.  He has noticed some increased  abdominal girth as well as significant pretibial edema.  He denies any  excessive dietary salt intake.  He and his wife estimate that he has  probably gained about 20 pounds in the past several months.   FAMILY HISTORY:  The patient's father had hypertension and is living.  The  patient's mother died of diabetes at age 13.  The patient has two siblings  in good health.  The patient is married.  He has one daughter, age 50.  The  patient works as a Furniture conservator/restorer.  It is not a physically stressful job.   The patient does not use alcohol, tobacco, or illicit drugs.   REVIEW OF SYSTEMS:  Reveals the fact that he has had some labored breathing  at night according to his wife.  He has had no chest pain.  He has not been  having  headaches or dizzy spells.  He denies any change in bowel habits,  hematochezia, or melena.  He has had some recent knee pain and had been  taking some aspirin and ibuprofen recently.  Remainder of the review of  systems is negative in detail.   PHYSICAL EXAMINATION:  VITAL SIGNS:  Blood pressure 152/100, but on  admission his pressure was initially 220/132, pulse 85 and regular,  respirations are normal.  NECK:  Jugular venous pressure is normal.  Carotids are normal.  The thyroid  is not enlarged or tender.  CHEST:  Bibasilar rales.  HEART:  S3 gallop.  ABDOMEN:  Soft and nontender.  There are no bruits.  Liver is not enlarged.  EXTREMITIES:  2 to 3+ pretibial edema.  NEUROLOGY:  Physiologic.   His electrocardiogram shows normal sinus rhythm, old anteroseptal myocardial  infarction with a QS pattern V1 to V2.  There are nonspecific T wave  abnormalities.  Chest  x-ray shows mild cardiomegaly with mild CHF.  His  initial cardiac enzymes are only borderline elevated consistent with  congestive heart failure with a CK-MB of 5.0 dropping to 4.3 and a troponin  at 0.05 and the second one the same at 0.05.  His lipids show a total  cholesterol of 148, HDL 36, LDL 90.  Renal function is normal with a BUN of  16, creatinine 1.4.  Potassium is low on admission at 3.3.   IMPRESSION:  1.  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  2.  Ischemic heart disease, status post coronary artery bypass graft surgery      in February of 2002 without clinical or laboratory evidence of recent      angina or infarction.  3.  Dilated cardiomyopathy with worsening global hypokinesis since prior      evaluation and his ejection fraction is now estimated at only 15 to 20%.  4.  Hypokalemia.   RECOMMENDATIONS:  Agree with present therapy of aspirin, ACE inhibitor, beta  blocker, and statin therapy.  I will also add Spironolactone 25 mg  daily.  I  will switch him from Lopressor to Coreg for his severe depression of LV  function.  He will certainly need close medical follow-up after discharge to  assure that he continues to take his medicine even when he is not having any  symptoms.  I will notify Dr. Martinique of the patient's admission in the  morning.  Will consider possible diagnostic catheterization at some point.      TB/MEDQ  D:  11/16/2004  T:  11/17/2004  Job:  [id number]   cc:   [name], M.D.   [name], M.D.  [address] [place], [place] [address]  Fax: [phone]

## 2010-11-28 NOTE — Discharge Summary (Signed)
NAMECONNY, BERI             ACCOUNT NO.:  [id number]   MEDICAL RECORD NO.:  [id number]          PATIENT TYPE:  INP   LOCATION:  2021                         FACILITY:  [facility]   PHYSICIAN:  Sherryl Manges, M.D.  DATE OF BIRTH:  [birth year]   DATE OF ADMISSION:  11/13/2004  DATE OF DISCHARGE:  11/18/2004                                 DISCHARGE SUMMARY   PRIMARY CARE PHYSICIAN:  Unassigned.   CARDIOLOGIST:  Dr. Peter Martinique.   DISCHARGE DIAGNOSES:  1.  Coronary artery disease status post coronary artery bypass graft in      2002.  2.  Dilated cardiomyopathy, congestive heart failure.  3.  Hypertension.  4.  Dyslipidemia.   DISCHARGE MEDICATIONS:  1.  Enteric coated aspirin 325 mg p.o. daily.  2.  Zocor 20 mg p.o. q.h.s.  3.  Lasix 40 mg p.o. b.i.d.  4.  Altace 10 mg p.o. daily.  5.  K-Dur 20 mEq p.o. b.i.d.  6.  Aldactone 25 mg p.o. daily.  7.  Coreg 6.25 mg p.o. b.i.d.  8.  Norvasc 10 mg p.o. daily.   PROCEDURE:  1.  Portable chest x-ray dated Nov 13, 2004.  This showed cardiomegaly,      moderate congestive heart failure, prior coronary artery bypass graft.  2.  Chest x-ray dated Nov 14, 2004.  This showed improving pulmonary edema      and bibasilar atelectasis with small bilateral pleural effusion.  3.  2-D echocardiogram dated Nov 14, 2004.  This showed mildly dilated left      ventricle, severely reduced left ventricular systolic function with LVEF      15-25%, diffuse left ventricular hypokinesis, akinesis of the mid distal      inferior wall, akinesis of the apical wall, mild mitral regurgitation,      mildly dilated left atrium, mild-to-moderately dilated right atrium,      right ventricular systolic function reduced, moderately increased      estimated peak right ventricular systolic pressure, mild-to-moderate      tricuspid regurgitation, moderately dilated right atrium, pulmonary      hypertension now present.   CONSULTATIONS:  1.  Dr. Mare Ferrari,  cardiologist.  2.  Dr. Peter Martinique, cardiologist.   ADMISSION HISTORY:  Essentially as in H&P of Nov 13, 2004.  However, in  brief, this is a 55 year old male with a known history of coronary artery  disease status post coronary artery bypass graft in 2002, dyslipidemia who  presents with a 1-2 week history of increasing shortness of breath with  exertion and peripheral edema not associated with chest pain.  The patient  has been noncompliant with his medication over the past 3-4 years, and is a  known hypertensive.  He was admitted for further evaluation, investigation,  and management.   CLINICAL COURSE:  1.  Congestive heart failure secondary to dilated cardiomyopathy/ischemic      cardiomyopathy.  At the time of presentation, physical evaluation      revealed the patient to be in congestive heart failure with elevated      JVP, pulmonary crackles, and bilateral lower extremity  edema.  Chest x-      ray confirmed cardiomegaly and pulmonary edema.  The patient was      therefore managed with diuretics, beta blockers, and ACE inhibitors with      satisfactory clinical response.  He underwent 2-D echocardiogram on Nov 15, 2003 which showed severely decreased left ventricular function with      ejection fraction of 15-25%.  Certainly, there was worsening of left      ventricular function compared to his 2-D echocardiogram of December      2001.  He had also developed pulmonary hypertension.  Given these      findings and the fact that there were areas of regional wall motion,      hypokinesis/akinesis, against background of global hypokinesis, it was      felt appropriate to request cardiology consultation. As the patient in      the past been under the care of Dr. Peter Martinique, he was called on      consultation.  Initial consultation was kindly provided by Dr.      Mare Ferrari, and then subsequently by Dr. Peter Martinique.  Conclusion is      that ideally patient would be a candidate  for cardiac catheterization.      However, he is an arteriopath, and vascular access is technically      difficult.  Recommendation was therefore to manage patient      conservatively and schedule possible outpatient CT coronary angiogram      which will be arranged by Dr. Peter Martinique at the time of outpatient      followup.   1.  Uncontrolled hypertension.  At the time of admission, the patient's      blood pressure was severely elevated at 220/132 mmHg as the patient was      that time in apparent congestive heart failure, this constituted a      hypertensive urgency.  The patient was therefore commenced on      intravenous nitroglycerin drip and intravenous Lasix in the emergency      department resulting in blood pressure of 176/105.  Thereafter, he was      taken off intravenous nitroglycerin and commenced on oral medication      including ACE inhibitors and beta blockers, and diuretics were      continued.  This resulted in gradual, albeit, significant improvement in      blood pressure control.  Subsequently, on the advice of cardiology beta      blocker was discontinued, the patient was switched to Coreg and calcium      channel blocker was added to antihypertensive regimen.   1.  Dyslipidemia.  The patient is known to have dyslipidemia and also has a      background history of coronary artery disease.  He was therefore placed      on statin treatment during the course of his hospital stay.   1.  History of coronary artery disease, status post coronary artery bypass      graft.  The patient had no complaint of chest pain even at the time of      presentation.  12-lead EKG showed no evidence of acute ischemic changes.      Cardiac enzymes remained unelevated.  He was placed on aspirin therapy.   DISPOSITION:  The patient was deemed satisfactory/stable for discharge on  Nov 18, 2004, and was therefore discharged accordingly.   PAIN  MANAGEMENT:  Not applicable.  ACTIVITY:  As  tolerated.   DIET:  Healthy heart diet.   WOUND CARE:  Not applicable.   SPECIAL INSTRUCTIONS:  Recommended off work per advice of cardiology until  December 15, 2004.   FOLLOW UP:  The patient is to follow up with Dr. Peter Martinique in cardiology.  Telephone number [phone].  The patient is instructed to call for an  appointment and has verbalized understanding.      CO/MEDQ  D:  11/21/2004  T:  11/22/2004  Job:  [id number]   cc:   Peter M. Martinique, M.D.  [address] [place], [place] [address]  Fax: [phone]   Darlin Coco, M.D.  [id number], [address] [place]  [place] [address]  Fax: [phone]

## 2010-11-28 NOTE — Op Note (Signed)
[place]. [facility]  Patient:    [name], [name]                    MRN: [id number] Proc. Date: 11/30/00 Adm. Date:  [id number] Attending:  Len Childs CC:         CVTS Office  Peter M. Martinique, M.D.   Operative Report  PREOPERATIVE DIAGNOSIS:  Class III progressive angina, severe three vessel coronary artery disease with a positive stress Cardiolite.  POSTOPERATIVE DIAGNOSIS:  Class III progressive angina, severe three vessel coronary artery disease with a positive stress Cardiolite.  OPERATION:  Coronary artery bypass grafting times four (left internal mammary artery to diagonal, right radial artery graft to obtuse marginal 1, saphenous vein graft to obtuse marginal 4, saphenous vein graft to right coronary artery).  SURGEON:  Len Childs, M.D.  ASSISTANT:  Revonda Standard. Roxan Hockey, M.D./Jody Marrianne Mood.  ANESTHESIA:  General  INDICATIONS:  The patient is a 55 year old black male with a history of hypertension and an abnormal echocardiogram and an abnormal stress Cardiolite. Cardiac catheterization by Dr. Peter Martinique demonstrated severe three vessel coronary artery disease including total occlusion of the LAD with high grade stenosis of the ostium of the LAD, 80% stenosis of the circumflex and 80% stenosis of the right coronary artery with mild anterior wall hypokinesia.  He was referred for surgical coronary revascularization.  Prior to the operation, I examined the patient in his hospital room and reviewed the results of the heart catheterization with the patient and his wife.  I discussed the indications and expected benefits of coronary bypass surgery.  I discussed the major aspects of the procedure including the choice of conduit, the use of radial artery from his right nondominant arm, the location of the surgical incisions, the use of general anesthesia and cardiopulmonary bypass, and the expected hospital recovery  following the operation.  I discussed the alternatives to surgical therapy for his coronary artery disease and their expected outcome.  I reviewed the major risks associated with this operation to the patient including the risks of MI, CVA, bleeding, infection and death. He understood the implications for surgery and agreed to proceed with the operation as planned under what I felt was an informed consent.  OPERATIVE FINDINGS:  The patients coronaries were poor targets.  The LAD was atretic and nongraftable and the mammary was placed to the diagonal or anterolateral which was the dominant vessel on the anterior wall.  The coronaries were sclerotic and calcified.  The radial artery and mammary artery were small but had adequate flow measuring approximately 1.5 mm.  The vein was average to above average in quality.  The heart was hypertrophied and the patients body habitus made exposure of the posterior aspects of the heart difficult.  The ascending aorta was short and the cannula was placed under the innominate vein at the innominate artery take off.  Redo procedure on this patient would be ill advised due to the body habitus, the poor nature of the coronary vessels and the short aorta.  DESCRIPTION OF PROCEDURE:  The patient was brought to the operating room and placed supine on the operating table where general anesthesia was induced under invasive hemodynamic monitoring.  First the right radial artery was harvested from the right forearm after the arm was prepped and draped.  Before removing the vessel a patent palmar arch from the ulnar artery was documented. The patient was given a small dose of heparin  and the radial artery was excised and flushed with a papaverine heparin solution.  The arm was irrigated and closed in two layers using running Vicryl.  The patient was then prepped and draped for the sternotomy with the right arm tucked.  A sternal incision was made as the saphenous  vein was harvested from the right leg.  The internal mammary artery was harvested as a pedicle graft from its origin at the subclavian vessels.  It was a small vessel approximately 1.5 mm in diameter but with adequate flow.  Heparin was administered and the ECT was documented as being therapeutic.  Two pursestrings were placed in the ascending aorta and right atrium.  The patient was cannulated and placed on bypass and cooled to 32 degrees.  The coronaries were identified and the mammary artery, radial artery, and vein grafts were prepared for the distal anastomoses.  The LAD was atretic and nongraftable.  The diagonal was a sclerotic calcified vessel but graftable.  The OM1 was intramyocardial and the OM4 was an artery on the posterior lateral aspect of the left ventricle which was diseased.  The right coronary was grafted just before its bifurcation into the posterior descending.  The cardioplegia cannula was placed.  The patient was cooled to 28 degrees.  As the aortic cross clamp was applied, 800 cc of cold blood cardioplegia was delivered to the aortic root with immediate cardioplegic arrest and septal temperature dropping less than 15 degrees.  Topical iced saline slush was used to augment myocardial preservation and a pericardial instillator pad was used to protect the left phrenic nerve.  The distal coronary anastomoses were then performed.  The first distal anastomosis was to the distal right.  This was a 1.8 mm vessel, proximal 80% stenosis and a reversed saphenous vein was sewn end to side with a running 7-0 Prolene with good flow through the graft.  The second distal anastomosis was the OM4.  This was in the posterior lateral aspect of the left ventricle.  It was a 1.5 mm vessel and a reversed saphenous vein was sewn end to side with a running 7-0 Prolene with good flow through the graft.  The third distal anastomosis was the OM1.  This was underneath the atrial appendage and  was a  1.5 mm vessel.  The radial artery was sewn in an end to side fashion using running 8-0 Prolene.  There was good flow through the graft.  Cardioplegia was redosed.  The fourth distal anastomosis was to the proximal diagonal or anterior lateral branch of the LAD.  This was 1.6 mm vessel with proximal 99% stenosis and the mammary pedicle was brought through an opening created in the left lateral pericardium.  It was brought down on to the coronary and sewn end to side with a running 8-0 Prolene.  There was good flow through the anastomosis after release of the pedicle clamp on the mammary artery.  The mammary pedicle was secured to the epicardium.  The aortic cross clamp was removed.  The heart was cardioverted back to a regular rhythm.  Three proximal anastomoses were placed on the ascending aorta with the saphenous vein to the distal circumflex superiorly, the radial artery graft to the first marginal in the middle and the vein graft to the right coronary artery inferiorly on the aorta.  The patient was rewarmed and reperfused and temporary pacing wires were applied.  When the patient reached 37 degrees, the lungs were re-expanded and the patient was weaned from  cardiopulmonary bypass without difficulty without inotropes.  He was given one unit of blood due to a hematocrit of 17% on bypass.  The patient remained hemodynamically stable and protamine was administered which he tolerated.  After protamine and reversal of the heparin, the cannulae were removed.  The mediastinum was irrigated with warm antibiotic irrigation and the leg incision was irrigated and closed in a standard fashion. The pericardial fat was closed superiorly over the aorta and vein grafts and two mediastinal and a left pleural chest tube were placed and brought out through separate incisions.  The sternum was reapproximated with interrupted steel wires.  The pectoralis fascia was closed with interrupted  #1 Vicryl.  The subcutaneous and subcuticular layers were closed with running Vicryl and sterile dressings were applied.  Total cardiopulmonary bypass time was 120 minutes with aortic cross clamp time of 51 minutes. DD:  11/30/00 TD:  12/01/00 Job: [id number]

## 2010-12-16 ENCOUNTER — Other Ambulatory Visit: Payer: Self-pay | Admitting: Cardiology

## 2010-12-16 NOTE — Telephone Encounter (Signed)
escribe medication per fax request  

## 2010-12-17 ENCOUNTER — Other Ambulatory Visit: Payer: Self-pay | Admitting: Cardiology

## 2010-12-17 MED ORDER — RAMIPRIL 10 MG PO CAPS: 10.0000 mg | ORAL_CAPSULE | Freq: Every day | ORAL | Status: DC

## 2010-12-17 NOTE — Telephone Encounter (Signed)
Called wanting you to call him back for his medication refills Ramipril, Carvedilol at [facility]. Please call back.

## 2010-12-17 NOTE — Telephone Encounter (Signed)
Requesting refill on Ramipril and Carvedilol. Had refilled Carvedilol. Will send Ramipril to [facility]. Advised of his app end of June.

## 2011-01-07 ENCOUNTER — Ambulatory Visit (INDEPENDENT_AMBULATORY_CARE_PROVIDER_SITE_OTHER): Payer: BC Managed Care – PPO | Admitting: Cardiology

## 2011-01-07 ENCOUNTER — Encounter: Payer: Self-pay | Admitting: Cardiology

## 2011-01-07 VITALS — BP 150/82 | HR 78 | Ht 69.5 in | Wt 201.4 lb

## 2011-01-07 DIAGNOSIS — I420 Dilated cardiomyopathy: Secondary | ICD-10-CM

## 2011-01-07 DIAGNOSIS — I2510 Atherosclerotic heart disease of native coronary artery without angina pectoris: Secondary | ICD-10-CM

## 2011-01-07 DIAGNOSIS — I10 Essential (primary) hypertension: Secondary | ICD-10-CM

## 2011-01-07 DIAGNOSIS — E785 Hyperlipidemia, unspecified: Secondary | ICD-10-CM

## 2011-01-07 DIAGNOSIS — E878 Other disorders of electrolyte and fluid balance, not elsewhere classified: Secondary | ICD-10-CM

## 2011-01-07 DIAGNOSIS — I428 Other cardiomyopathies: Secondary | ICD-10-CM

## 2011-01-07 NOTE — Progress Notes (Signed)
   [name] Date of Birth: 1955-09-04   History of Present Illness: [name] is seen today for followup. He has not been seen since a year ago. He reports compliance with his medications. He denies any symptoms of chest pain, shortness of breath, palpitations, or dizziness. He has had no edema or orthopnea. He has recently been reemployed but is having to work third shift.  Current Outpatient Prescriptions on File Prior to Visit  Medication Sig Dispense Refill  . amLODipine (NORVASC) 10 MG tablet Take 10 mg by mouth daily.        [name] aspirin 81 MG tablet Take 81 mg by mouth daily.        [name] atorvastatin (LIPITOR) 40 MG tablet Take 40 mg by mouth daily.        . carvedilol (COREG) 25 MG tablet TAKE 1 TABLET TWICE A DAY  180 tablet  3  . furosemide (LASIX) 40 MG tablet Take 40 mg by mouth daily.        . hydrALAZINE (APRESOLINE) 25 MG tablet Take 25 mg by mouth 3 (three) times daily.        . isosorbide dinitrate (ISORDIL) 20 MG tablet Take 20 mg by mouth 2 (two) times daily.        . potassium chloride SA (K-DUR,KLOR-CON) 20 MEQ tablet Take 20 mEq by mouth 2 (two) times daily.        . ramipril (ALTACE) 10 MG capsule Take 1 capsule (10 mg total) by mouth daily.  90 capsule  3  . spironolactone (ALDACTONE) 25 MG tablet Take 25 mg by mouth daily.          No Known Allergies  Past Medical History  Diagnosis Date  . CAD (coronary artery disease)     CABG 2002  . Dilated cardiomyopathy   . HTN (hypertension)   . Hyperlipemia   . PAD (peripheral artery disease)   . Chronic renal insufficiency     Past Surgical History  Procedure Date  . Coronary artery bypass graft 2002    x4 DR. VAN TRIGT  . Cardiac catheterization 2002, 2006    History  Smoking status  . Never Smoker   Smokeless tobacco  . Not on file    History  Alcohol Use     Family History  Problem Relation Age of Onset  . Hypertension Father   . Diabetes Mother     DIABETIC COMA    Review of Systems: As  per history of present illness.  All other systems were reviewed and are negative.  Physical Exam: BP 150/82  Pulse 78  Ht 5' 9.5" (1.765 m)  Wt 201 lb 6.4 oz (91.354 kg)  BMI 29.32 kg/m2 He is a pleasant black male in no acute distress. He is normocephalic, atraumatic. Pupils equal round and reactive. Sclera clear. Oropharynx is clear. Neck is supple without JVD, adenopathy, thyromegaly, or bruits. Lungs are clear. Cardiac exam reveals a regular rate and rhythm without gallop, murmur, or click. Abdomen is soft and nontender. He has no masses. Extremities are without edema. His pedal pulses are decreased. He is alert and oriented x3. Cranial nerves II through XII are intact. LABORATORY DATA: ECG demonstrates normal sinus rhythm with PVCs and PACs. 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  Assessment / Plan:

## 2011-01-07 NOTE — Assessment & Plan Note (Signed)
Ejection fraction of 30-35%. He is asymptomatic and appears to be euvolemic. He is on aggressive heart failure medications.

## 2011-01-07 NOTE — Assessment & Plan Note (Signed)
He is not a candidate for further revascularization. He is asymptomatic on aggressive medical therapy. I have stressed the importance of compliance with his medical treatment. We will followup fasting blood work today including chemistries and a lipid panel. I will followup again in 6 months.

## 2011-01-07 NOTE — Assessment & Plan Note (Signed)
Blood pressure is mildly elevated today. I have stressed compliance with his medications and sodium restriction in his diet. We will continue with his current therapy.

## 2011-01-07 NOTE — Patient Instructions (Signed)
We will check blood work on you today.  Stay on all your medications.  Keep an eye on your blood pressure.  Keep your weight down.

## 2011-01-08 ENCOUNTER — Encounter: Payer: Self-pay | Admitting: Cardiology

## 2011-01-08 LAB — HEPATIC FUNCTION PANEL
ALT: 27 U/L (ref 0–53)
AST: 30 U/L (ref 0–37)
Alkaline Phosphatase: 59 U/L (ref 39–117)
Bilirubin, Direct: 0.1 mg/dL (ref 0.0–0.3)
Total Bilirubin: 1 mg/dL (ref 0.3–1.2)

## 2011-01-08 LAB — BASIC METABOLIC PANEL
CO2: 26 mEq/L (ref 19–32)
Potassium: 4.2 mEq/L (ref 3.5–5.1)

## 2011-01-08 LAB — LIPID PANEL: HDL: 45.9 mg/dL (ref >39.00)

## 2011-01-09 LAB — LDL CHOLESTEROL, DIRECT: Direct LDL: 149.4 mg/dL

## 2011-01-13 ENCOUNTER — Telehealth: Payer: Self-pay | Admitting: *Deleted

## 2011-01-13 NOTE — Telephone Encounter (Signed)
Lm to call back w/ lab results

## 2011-01-13 NOTE — Telephone Encounter (Signed)
Message copied by Hetty Blend on Tue Jan 13, 2011 10:08 AM ------      Message from: Martinique, PETER M      Created: Fri Jan 09, 2011 12:35 PM       Glucose is mildly elevated. Lipids are not at goal. Other labs ok. Rec increasing lipitor to 80 mg daily. Avoid sweets and simple carbs.

## 2011-01-13 NOTE — Telephone Encounter (Signed)
Called back and gave lab results. He will increase Lipitor to 80 mg. Will call when needs new Rx.

## 2011-02-21 ENCOUNTER — Other Ambulatory Visit: Payer: Self-pay | Admitting: Cardiology

## 2011-02-23 NOTE — Telephone Encounter (Signed)
escribe medication per fax request  

## 2011-07-08 ENCOUNTER — Telehealth: Payer: Self-pay | Admitting: Cardiology

## 2011-07-08 MED ORDER — CARVEDILOL 25 MG PO TABS: 25.0000 mg | ORAL_TABLET | Freq: Two times a day (BID) | ORAL | Status: DC

## 2011-07-08 MED ORDER — RAMIPRIL 10 MG PO CAPS: 10.0000 mg | ORAL_CAPSULE | Freq: Every day | ORAL | Status: DC

## 2011-07-08 NOTE — Telephone Encounter (Signed)
New msg Pt wants to talk to you about his meds ramipril, carvedilol please call him back

## 2011-07-08 NOTE — Telephone Encounter (Signed)
Called stating he has run out of 2 of his medications. Has started new job and doesn't have drug coverage until January. Wants 10 day supply [facility] [address] Called Carvedilol 25 mg BID # 20 and Ramipril 10 mg # 10 into pharmacy. Pharmacy said would be cheaper to get 10 days than 30 days. Notified pt that they had been called in.

## 2011-07-09 ENCOUNTER — Telehealth: Payer: Self-pay | Admitting: Cardiology

## 2011-07-09 NOTE — Telephone Encounter (Signed)
Called stating when he went by pharmacy yesterday they didn't have his Rx's ready. [facility] and gave verbal order again for Carvedilol 25 mg BID # 20; and Ramipril 10 mg # 10 to Lattie Haw the Pharmacist. Notified pt.

## 2011-07-09 NOTE — Telephone Encounter (Signed)
Follow up from previous call  Returning [name] called back from yesterday

## 2011-07-15 ENCOUNTER — Telehealth: Payer: Self-pay

## 2011-07-15 NOTE — Telephone Encounter (Signed)
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

## 2011-08-04 ENCOUNTER — Telehealth: Payer: Self-pay | Admitting: Cardiology

## 2011-08-04 MED ORDER — RAMIPRIL 10 MG PO CAPS: 10.0000 mg | ORAL_CAPSULE | Freq: Every day | ORAL | Status: DC

## 2011-08-04 MED ORDER — CARVEDILOL 25 MG PO TABS: 25.0000 mg | ORAL_TABLET | Freq: Two times a day (BID) | ORAL | Status: DC

## 2011-08-04 NOTE — Telephone Encounter (Signed)
New Problem:     Patient called in needing a 10 day refill of his carvedilol (COREG) 25 MG tablet and ramipril (ALTACE) 10 MG capsule at the pharmacy he has on file.

## 2011-09-09 ENCOUNTER — Other Ambulatory Visit: Payer: Self-pay | Admitting: Cardiology

## 2011-09-09 NOTE — Telephone Encounter (Signed)
Pt needs refill sent in

## 2011-09-10 MED ORDER — CARVEDILOL 25 MG PO TABS: 25.0000 mg | ORAL_TABLET | Freq: Two times a day (BID) | ORAL | Status: DC

## 2011-09-10 MED ORDER — RAMIPRIL 10 MG PO CAPS: 10.0000 mg | ORAL_CAPSULE | Freq: Every day | ORAL | Status: DC

## 2011-09-10 MED ORDER — ISOSORBIDE DINITRATE 20 MG PO TABS: 20.0000 mg | ORAL_TABLET | Freq: Two times a day (BID) | ORAL | Status: DC

## 2011-09-10 NOTE — Telephone Encounter (Signed)
Refilled medications

## 2012-09-20 ENCOUNTER — Other Ambulatory Visit: Payer: Self-pay | Admitting: Cardiology

## 2012-09-23 ENCOUNTER — Other Ambulatory Visit: Payer: Self-pay

## 2012-09-23 MED ORDER — CARVEDILOL 25 MG PO TABS: 25.0000 mg | ORAL_TABLET | Freq: Two times a day (BID) | ORAL | Status: DC

## 2012-09-23 MED ORDER — RAMIPRIL 10 MG PO CAPS: 10.0000 mg | ORAL_CAPSULE | Freq: Every day | ORAL | Status: DC

## 2012-10-10 ENCOUNTER — Ambulatory Visit: Payer: BC Managed Care – PPO | Admitting: Internal Medicine

## 2012-12-01 ENCOUNTER — Ambulatory Visit: Payer: BC Managed Care – PPO | Admitting: Cardiology

## 2013-02-04 ENCOUNTER — Other Ambulatory Visit: Payer: Self-pay | Admitting: Cardiology

## 2013-02-06 NOTE — Telephone Encounter (Signed)
Patient called no answer.Left message on personal voice mail refill sent to pharmacy for carvedilol and altace.Keep appointment with Dr.Jordan 02/09/13.

## 2013-02-09 ENCOUNTER — Ambulatory Visit: Payer: BC Managed Care – PPO | Admitting: Cardiology

## 2013-02-10 ENCOUNTER — Ambulatory Visit: Payer: BC Managed Care – PPO | Admitting: Cardiology

## 2013-02-28 ENCOUNTER — Encounter: Payer: Self-pay | Admitting: Cardiology

## 2013-03-21 ENCOUNTER — Encounter: Payer: Self-pay | Admitting: Cardiology

## 2013-04-26 ENCOUNTER — Other Ambulatory Visit: Payer: Self-pay | Admitting: Cardiology

## 2013-04-27 NOTE — Telephone Encounter (Signed)
Follow up    Returned Cheryl's call

## 2013-04-27 NOTE — Telephone Encounter (Signed)
Follow up    Retuning call

## 2013-04-27 NOTE — Telephone Encounter (Signed)
Patient returning your call regarding his medication

## 2013-04-27 NOTE — Telephone Encounter (Signed)
Patient called no answer.Left message on personal voice mail need to call office to schedule appointment with Dr.Jordan.

## 2013-04-28 ENCOUNTER — Other Ambulatory Visit: Payer: Self-pay

## 2013-04-28 MED ORDER — ISOSORBIDE DINITRATE 20 MG PO TABS: 20.0000 mg | ORAL_TABLET | Freq: Two times a day (BID) | ORAL | Status: DC

## 2013-04-28 MED ORDER — RAMIPRIL 10 MG PO CAPS: ORAL_CAPSULE | ORAL | Status: DC

## 2013-04-28 MED ORDER — CARVEDILOL 25 MG PO TABS: ORAL_TABLET | ORAL | Status: DC

## 2013-04-28 NOTE — Telephone Encounter (Signed)
Spoke to patient advised he will need to make appointment with Dr.Jordan.Stated he already spoke to someone and he has appointment with Truitt Merle NP 05/10/13.Advised to keep that appointment and refill sent to pharmacy for medication.

## 2013-05-10 ENCOUNTER — Ambulatory Visit (INDEPENDENT_AMBULATORY_CARE_PROVIDER_SITE_OTHER): Payer: BC Managed Care – PPO | Admitting: Nurse Practitioner

## 2013-05-10 ENCOUNTER — Encounter: Payer: Self-pay | Admitting: Nurse Practitioner

## 2013-05-10 ENCOUNTER — Other Ambulatory Visit: Payer: Self-pay | Admitting: *Deleted

## 2013-05-10 VITALS — BP 200/100 | HR 80 | Ht 69.5 in | Wt 199.8 lb

## 2013-05-10 DIAGNOSIS — I259 Chronic ischemic heart disease, unspecified: Secondary | ICD-10-CM

## 2013-05-10 DIAGNOSIS — I10 Essential (primary) hypertension: Secondary | ICD-10-CM

## 2013-05-10 LAB — BASIC METABOLIC PANEL
BUN: 12 mg/dL (ref 6–23)
CO2: 24 mEq/L (ref 19–32)
Calcium: 9.2 mg/dL (ref 8.4–10.5)
Chloride: 105 mEq/L (ref 96–112)
Creatinine, Ser: 1.2 mg/dL (ref 0.4–1.5)
GFR: 82.38 mL/min (ref >60.00)
Glucose, Bld: 179 mg/dL — ABNORMAL HIGH (ref 70–99)
Potassium: 4.1 mEq/L (ref 3.5–5.1)
Sodium: 137 mEq/L (ref 135–145)

## 2013-05-10 LAB — HEPATIC FUNCTION PANEL
ALT: 16 U/L (ref 0–53)
AST: 21 U/L (ref 0–37)
Albumin: 3.9 g/dL (ref 3.5–5.2)
Alkaline Phosphatase: 88 U/L (ref 39–117)
Bilirubin, Direct: 0 mg/dL (ref 0.0–0.3)
Total Bilirubin: 0.7 mg/dL (ref 0.3–1.2)
Total Protein: 8.2 g/dL (ref 6.0–8.3)

## 2013-05-10 LAB — LIPID PANEL
Cholesterol: 191 mg/dL (ref 0–200)
HDL: 44.4 mg/dL (ref >39.00)
LDL Cholesterol: 122 mg/dL — ABNORMAL HIGH (ref 0–99)
Total CHOL/HDL Ratio: 4
Triglycerides: 121 mg/dL (ref 0.0–149.0)
VLDL: 24.2 mg/dL (ref 0.0–40.0)

## 2013-05-10 MED ORDER — FUROSEMIDE 40 MG PO TABS: 40.0000 mg | ORAL_TABLET | Freq: Every day | ORAL | Status: DC

## 2013-05-10 MED ORDER — ATORVASTATIN CALCIUM 40 MG PO TABS: 40.0000 mg | ORAL_TABLET | Freq: Every day | ORAL | Status: DC

## 2013-05-10 MED ORDER — POTASSIUM CHLORIDE CRYS ER 20 MEQ PO TBCR: 20.0000 meq | EXTENDED_RELEASE_TABLET | Freq: Two times a day (BID) | ORAL | Status: DC

## 2013-05-10 MED ORDER — CLONIDINE HCL 0.1 MG PO TABS
0.1000 mg | ORAL_TABLET | Freq: Once | ORAL | Status: AC
  Administered 2013-05-10: 0.1 mg via ORAL

## 2013-05-10 MED ORDER — CLONIDINE HCL 0.1 MG PO TABS: 0.1000 mg | ORAL_TABLET | Freq: Every day | ORAL | Status: DC

## 2013-05-10 NOTE — Progress Notes (Signed)
[name] Date of Birth: October 29, 1955 Medical Record [id number]  History of Present Illness: Mr. Grigas is seen back today for a follow up visit. Seen for Dr. Martinique. He has known CAD with remote CABG in 2002 per PVT, HTN, dilated CM, HLD. Has been noncompliant with follow up. Remote echo in 2009 showed dramatic improvement in LV function and was normal. Last cath in 2006 - as described below. He has been deemed to NOT be a candidate for further revascularization.   Not seen since June of 2012.   Comes back today. Here alone. Took his medicines maybe an hour before this visit. Says he has been taking his medicines but BP markedly high. No chest pain. Not short of breath. Not dizzy. No swelling. Tells me he does not need refills but according to our records looks like he has no refills. No labs done since his last visit here in 2012.   Current Outpatient Prescriptions  Medication Sig Dispense Refill  . amLODipine (NORVASC) 10 MG tablet Take 10 mg by mouth daily.        [name] aspirin 81 MG tablet Take 81 mg by mouth daily.        [name] atorvastatin (LIPITOR) 40 MG tablet Take 40 mg by mouth daily.        . carvedilol (COREG) 25 MG tablet TAKE 1 TABLET (25 MG TOTAL) BY MOUTH 2 (TWO) TIMES DAILY WITH A MEAL.  60 tablet  0  . furosemide (LASIX) 40 MG tablet TAKE 1 TABLET DAILY  90 tablet  3  . hydrALAZINE (APRESOLINE) 25 MG tablet Take 25 mg by mouth 3 (three) times daily.        . isosorbide dinitrate (ISORDIL) 20 MG tablet Take 1 tablet (20 mg total) by mouth 2 (two) times daily.  60 tablet  0  . potassium chloride SA (K-DUR,KLOR-CON) 20 MEQ tablet Take 20 mEq by mouth 2 (two) times daily.        . ramipril (ALTACE) 10 MG capsule TAKE 1 CAPSULE (10 MG TOTAL) BY MOUTH DAILY.  30 capsule  0  . spironolactone (ALDACTONE) 25 MG tablet Take 25 mg by mouth daily.         No current facility-administered medications for this visit.    No Known Allergies  Past Medical History  Diagnosis Date  .  CAD (coronary artery disease)     CABG 2002  . Dilated cardiomyopathy     echo in 2009 showed improvement with a normal EF  . HTN (hypertension)   . Hyperlipemia   . PAD (peripheral artery disease)   . Chronic renal insufficiency   . Noncompliance     Past Surgical History  Procedure Laterality Date  . Coronary artery bypass graft  2002    x4 DR. VAN TRIGT  . Cardiac catheterization  2002, 2006    History  Smoking status  . Never Smoker   Smokeless tobacco  . Not on file    History  Alcohol Use No    Family History  Problem Relation Age of Onset  . Hypertension Father   . Diabetes Mother     DIABETIC COMA    Review of Systems: The review of systems is per the HPI.  All other systems were reviewed and are negative.  Physical Exam: BP 200/100  Pulse 80  Ht 5' 9.5" (1.765 m)  Wt 199 lb 12.8 oz (90.629 kg)  BMI 29.09 kg/m2  SpO2 97% Patient is alert and in no  acute distress. BP by me is 220/120 in the left arm. Skin is warm and dry. Color is normal.  HEENT is unremarkable. Normocephalic/atraumatic. PERRL. Sclera are nonicteric. Neck is supple. No masses. No JVD. Lungs are clear. Cardiac exam shows a regular rate and rhythm. Abdomen is soft. Extremities are without edema. Gait and ROM are intact. No gross neurologic deficits noted.  LABORATORY DATA: EKG shows sinus rhythm with old septal infarct   Lab Results  Component Value Date   GLUCOSE 124* 01/07/2011   CHOL 207* 01/07/2011   TRIG 150.0* 01/07/2011   HDL 45.90 01/07/2011   LDLDIRECT 149.4 01/07/2011   ALT 27 01/07/2011   AST 30 01/07/2011   NA 137 01/07/2011   K 4.2 01/07/2011   CL 105 01/07/2011   CREATININE 1.2 01/07/2011   BUN 24* 01/07/2011   CO2 26 01/07/2011   CARDIAC CATH FINAL INTERPRETATION FROM 2006:  1. Severe three-vessel obstructive atherosclerotic coronary disease.  2. Patent saphenous vein graft to the first large obtuse marginal vessel.  3. Patent saphenous vein graft to the posterior descending  artery with very  small caliber graft throughout.  4. Occluded radial graft to the first marginal vessel.  5. Atretic left internal mammary artery graft to the diagonal.  6. Moderate to severe left ventricular dysfunction.  PLAN: The PDA of the right coronary has dual blood supply both from the  small caliber vein graft and the native vessel. This area does not appear to  be in jeopardy. The proximal and mid circumflex distribution are well  supplied by the native vessel and the distal circumflex including the fourth  and third marginal vessels are supplied by the functioning vein graft. The  LAD is chronic and totally occluded after the diagonal. Therefore the only  territory at risk at this point is the diagonal. There is a high-grade  ostial LAD stenosis obtunding this vessel. This lesion, I think, high risk  for catheter-based intervention given its ostial location and the potential  for compromise of the circumflex. I would recommend continued aggressive  medical therapy at this point, especially since the patient does not have  any active angina.  ______________________________  Peter M. Martinique, M.D.  PMJ/MEDQ D: 04/21/2005 T: 04/21/2005 Job: [id number]   Assessment / Plan: 1. HTN - markedly high - says he has had his medicines today - I am not convinced - has no refills on most of them - I have given him a dose of Clonidine 0.1 mg here in the office - he was monitored for almost an hour - BP down to 160/90. Will continue the clonidine 0.1 at night - see him back in a week.   2. CAD - remote CABG - no symptoms reported.   3. HLD - needs labs checked.   4. Noncompliance  Patient is agreeable to this plan and will call if any problems develop in the interim.   Burtis Junes, RN, [facility] [address] [place], [place]  [address]

## 2013-05-10 NOTE — Patient Instructions (Addendum)
Stay on your current medicines but I am adding Clonidine 0.1 mg to take at bedtime - this is at the drug store.  I have refilled your Lasix and your potassium today  We will check labs today  I need to see you in a week  Restrict your salt  Call the [place] office at [phone] if you have any questions, problems or concerns.

## 2013-05-24 ENCOUNTER — Ambulatory Visit (INDEPENDENT_AMBULATORY_CARE_PROVIDER_SITE_OTHER): Payer: Self-pay | Admitting: Nurse Practitioner

## 2013-05-24 ENCOUNTER — Encounter: Payer: Self-pay | Admitting: Nurse Practitioner

## 2013-05-24 VITALS — BP 152/100 | HR 80 | Ht 69.0 in | Wt 194.5 lb

## 2013-05-24 DIAGNOSIS — I10 Essential (primary) hypertension: Secondary | ICD-10-CM

## 2013-05-24 MED ORDER — HYDRALAZINE HCL 25 MG PO TABS: 25.0000 mg | ORAL_TABLET | Freq: Three times a day (TID) | ORAL | Status: DC

## 2013-05-24 NOTE — Progress Notes (Signed)
[name] Date of Birth: [birth year] Medical Record [id number]  History of Present Illness: [name] is seen back today for a 2 week check. Seen for [name]. He has known CAD with remote CABG in 2002 per PVT, HTN, dilated CM, HLD and noncompliant with follow up in the past. Echo from 2009 showed dramatic improvement in his LV function and was normal. Last cath in 2006 - not a candidate for further revascularization. Remote CTA of the abdomen showed 50% left renal artery stenosis.   Seen 2 weeks ago after over a 2 year absence. BP markedly high - said he was taking - not clear how he was getting refilled. Clonidine added.   Comes back today. Here alone. Feels good. Once again, difficult to gauge his taking of his medicines - not using hydralazine TID. No chest pain. Not short of breath. Feels ok. Some dry mouth but the Clonidine actually helps him sleep. Has lost 5 pounds.   Current Outpatient Prescriptions  Medication Sig Dispense Refill  . amLODipine (NORVASC) 10 MG tablet Take 10 mg by mouth daily.        [name] aspirin 81 MG tablet Take 81 mg by mouth daily.        [name] atorvastatin (LIPITOR) 40 MG tablet Take 1 tablet (40 mg total) by mouth daily.  30 tablet  6  . carvedilol (COREG) 25 MG tablet TAKE 1 TABLET (25 MG TOTAL) BY MOUTH 2 (TWO) TIMES DAILY WITH A MEAL.  60 tablet  0  . cloNIDine (CATAPRES) 0.1 MG tablet Take 1 tablet (0.1 mg total) by mouth at bedtime.  30 tablet  11  . furosemide (LASIX) 40 MG tablet Take 1 tablet (40 mg total) by mouth daily.  30 tablet  6  . hydrALAZINE (APRESOLINE) 25 MG tablet Take 25 mg by mouth 3 (three) times daily.        . isosorbide dinitrate (ISORDIL) 20 MG tablet Take 1 tablet (20 mg total) by mouth 2 (two) times daily.  60 tablet  0  . potassium chloride SA (K-DUR,KLOR-CON) 20 MEQ tablet Take 1 tablet (20 mEq total) by mouth 2 (two) times daily.  60 tablet  6  . ramipril (ALTACE) 10 MG capsule TAKE 1 CAPSULE (10 MG TOTAL) BY MOUTH DAILY.   30 capsule  0  . spironolactone (ALDACTONE) 25 MG tablet Take 25 mg by mouth daily.         No current facility-administered medications for this visit.    No Known Allergies  Past Medical History  Diagnosis Date  . CAD (coronary artery disease)     CABG 2002  . Dilated cardiomyopathy     echo in 2009 showed improvement with a normal EF  . HTN (hypertension)   . Hyperlipemia   . PAD (peripheral artery disease)   . Chronic renal insufficiency   . Noncompliance     Past Surgical History  Procedure Laterality Date  . Coronary artery bypass graft  2002    x4 DR. VAN TRIGT  . Cardiac catheterization  2002, 2006    History  Smoking status  . Never Smoker   Smokeless tobacco  . Not on file    History  Alcohol Use No    Family History  Problem Relation Age of Onset  . Hypertension Father   . Diabetes Mother     DIABETIC COMA    Review of Systems: The review of systems is per the HPI.  All other systems were reviewed  and are negative.  Physical Exam: BP 152/100  Pulse 80  Ht 5\' 9"  (1.753 m)  Wt 194 lb 8 oz (88.225 kg)  BMI 28.71 kg/m2  SpO2 97% Repeat BP by me is down to 130/90. Patient is very pleasant and in no acute distress. Skin is warm and dry. Color is normal.  HEENT is unremarkable. Normocephalic/atraumatic. PERRL. Sclera are nonicteric. Neck is supple. No masses. No JVD. Lungs are clear. Cardiac exam shows a regular rate and rhythm. Abdomen is soft. Extremities are without edema. Gait and ROM are intact. No gross neurologic deficits noted.  LABORATORY DATA:  Lab Results  Component Value Date   GLUCOSE 179* 05/10/2013   CHOL 191 05/10/2013   TRIG 121.0 05/10/2013   HDL 44.40 05/10/2013   LDLDIRECT 149.4 01/07/2011   LDLCALC 122* 05/10/2013   ALT 16 05/10/2013   AST 21 05/10/2013   NA 137 05/10/2013   K 4.1 05/10/2013   CL 105 05/10/2013   CREATININE 1.2 05/10/2013   BUN 12 05/10/2013   CO2 24 05/10/2013     CARDIAC CATH FINAL  INTERPRETATION FROM 2006:  1. Severe three-vessel obstructive atherosclerotic coronary disease.  2. Patent saphenous vein graft to the first large obtuse marginal vessel.  3. Patent saphenous vein graft to the posterior descending artery with very  small caliber graft throughout.  4. Occluded radial graft to the first marginal vessel.  5. Atretic left internal mammary artery graft to the diagonal.  6. Moderate to severe left ventricular dysfunction.  PLAN: The PDA of the right coronary has dual blood supply both from the  small caliber vein graft and the native vessel. This area does not appear to  be in jeopardy. The proximal and mid circumflex distribution are well  supplied by the native vessel and the distal circumflex including the fourth  and third marginal vessels are supplied by the functioning vein graft. The  LAD is chronic and totally occluded after the diagonal. Therefore the only  territory at risk at this point is the diagonal. There is a high-grade  ostial LAD stenosis obtunding this vessel. This lesion, I think, high risk  for catheter-based intervention given its ostial location and the potential  for compromise of the circumflex. I would recommend continued aggressive  medical therapy at this point, especially since the patient does not have  any active angina.  ______________________________  Peter M. Welch, M.D.  PMJ/MEDQ D: 04/21/2005 T: 04/21/2005 Job: [id number]    Assessment / Plan: 1. HTN - will have him take his Hydralazine TID. See him back in 3 months. Continue with his other medicines. Compliance strongly encouraged. Do not think he needs a duplex of his renal arteries at this time since BP is coming down nicely.  2. CAD - prior CABG - last cath in 2006 - managed medically - no symptoms.   Patient is agreeable to this plan and will call if any problems develop in the interim.   Burtis Junes, RN, [facility] [address] [place], [place]  [address]

## 2013-05-24 NOTE — Patient Instructions (Signed)
Stay on your current medicines - take the hydralazine 3 times a day  Try to check your blood pressure at the drug store - sit there for about 5 to 10 minutes, then take - write down the readings  I will see you in about 3 months  Call the [place] office at [phone] if you have any questions, problems or concerns.

## 2013-06-17 ENCOUNTER — Other Ambulatory Visit: Payer: Self-pay | Admitting: Cardiology

## 2013-06-20 ENCOUNTER — Other Ambulatory Visit: Payer: Self-pay | Admitting: Cardiology

## 2013-08-25 ENCOUNTER — Ambulatory Visit: Payer: Self-pay | Admitting: Nurse Practitioner

## 2013-09-04 ENCOUNTER — Ambulatory Visit: Payer: Self-pay | Admitting: Nurse Practitioner

## 2013-09-27 ENCOUNTER — Ambulatory Visit (INDEPENDENT_AMBULATORY_CARE_PROVIDER_SITE_OTHER): Payer: Self-pay | Admitting: Nurse Practitioner

## 2013-09-27 ENCOUNTER — Encounter: Payer: Self-pay | Admitting: Nurse Practitioner

## 2013-09-27 ENCOUNTER — Other Ambulatory Visit: Payer: Self-pay | Admitting: *Deleted

## 2013-09-27 VITALS — BP 200/100 | HR 76 | Ht 69.5 in | Wt 201.8 lb

## 2013-09-27 DIAGNOSIS — Z91199 Patient's noncompliance with other medical treatment and regimen due to unspecified reason: Secondary | ICD-10-CM

## 2013-09-27 DIAGNOSIS — Z9119 Patient's noncompliance with other medical treatment and regimen: Secondary | ICD-10-CM

## 2013-09-27 DIAGNOSIS — I10 Essential (primary) hypertension: Secondary | ICD-10-CM

## 2013-09-27 LAB — BASIC METABOLIC PANEL
BUN: 15 mg/dL (ref 6–23)
CO2: 24 mEq/L (ref 19–32)
Calcium: 9.1 mg/dL (ref 8.4–10.5)
Chloride: 105 mEq/L (ref 96–112)
Creatinine, Ser: 1.3 mg/dL (ref 0.4–1.5)
GFR: 75.53 mL/min (ref >60.00)
Glucose, Bld: 161 mg/dL — ABNORMAL HIGH (ref 70–99)
Potassium: 3.9 mEq/L (ref 3.5–5.1)
Sodium: 134 mEq/L — ABNORMAL LOW (ref 135–145)

## 2013-09-27 MED ORDER — HYDRALAZINE HCL 25 MG PO TABS: 25.0000 mg | ORAL_TABLET | Freq: Three times a day (TID) | ORAL | Status: DC

## 2013-09-27 MED ORDER — CLONIDINE HCL 0.1 MG PO TABS: 0.1000 mg | ORAL_TABLET | Freq: Every day | ORAL | Status: DC

## 2013-09-27 MED ORDER — CLONIDINE HCL 0.1 MG PO TABS
0.1000 mg | ORAL_TABLET | Freq: Once | ORAL | Status: AC
  Administered 2013-09-27: 0.1 mg via ORAL

## 2013-09-27 NOTE — Progress Notes (Signed)
[name] Date of Birth: 1955-10-21 Medical Record [id number]  History of Present Illness: Mr. Stengel is seen back today for a 3 month check. Seen for Dr. Martinique. He has known CAD with remote CABG in 2002 per [facility]T, HTN, dilated CM, HLD and noncompliant with follow up in the past. Echo from 2009 showed dramatic improvement in his LV function and was normal. Last cath in 2006 - not a candidate for further revascularization. Remote CTA of the abdomen showed 50% left renal artery stenosis.   Seen back in November of 2014 after over a 2 year absence. BP markedly high - said he was taking - not clear how he was getting medicines refilled. Clonidine added. Did keep a follow up visit and BP was down. He was not taking his medicines as we had asked.   Comes back today. Here alone. Says he feels ok. No chest pain. Not short of breath. NOT taking the hydralazine or the clonidine - says his wife threw the bottles away when they were empty. Has been out of for several weeks. Did not know that he could go to the drug store and ask for refills. BP quite high here today.   Current Outpatient Prescriptions  Medication Sig Dispense Refill  . amLODipine (NORVASC) 10 MG tablet Take 10 mg by mouth daily.        [name] aspirin 81 MG tablet Take 81 mg by mouth daily.        [name] atorvastatin (LIPITOR) 40 MG tablet Take 1 tablet (40 mg total) by mouth daily.  30 tablet  6  . carvedilol (COREG) 25 MG tablet TAKE 1 TABLET TWICE DAILY WITH A MEAL.  60 tablet  1  . furosemide (LASIX) 40 MG tablet Take 1 tablet (40 mg total) by mouth daily.  30 tablet  6  . isosorbide dinitrate (ISORDIL) 20 MG tablet TAKE 1 TABLET TWICE DAILY  60 tablet  1  . potassium chloride SA (K-DUR,KLOR-CON) 20 MEQ tablet Take 1 tablet (20 mEq total) by mouth 2 (two) times daily.  60 tablet  6  . ramipril (ALTACE) 10 MG capsule TAKE 1 CAPSULE (10 MG TOTAL) BY MOUTH DAILY.  30 capsule  1  . spironolactone (ALDACTONE) 25 MG tablet Take 25 mg by mouth  daily.        . cloNIDine (CATAPRES) 0.1 MG tablet Take 1 tablet (0.1 mg total) by mouth at bedtime.  30 tablet  11  . hydrALAZINE (APRESOLINE) 25 MG tablet Take 1 tablet (25 mg total) by mouth 3 (three) times daily.  90 tablet  6   No current facility-administered medications for this visit.    No Known Allergies  Past Medical History  Diagnosis Date  . CAD (coronary artery disease)     CABG 2002  . Dilated cardiomyopathy     echo in 2009 showed improvement with a normal EF  . HTN (hypertension)   . Hyperlipemia   . PAD (peripheral artery disease)   . Chronic renal insufficiency   . Noncompliance     Past Surgical History  Procedure Laterality Date  . Coronary artery bypass graft  2002    x4 DR. VAN TRIGT  . Cardiac catheterization  2002, 2006    History  Smoking status  . Never Smoker   Smokeless tobacco  . Not on file    History  Alcohol Use No    Family History  Problem Relation Age of Onset  . Hypertension Father   .  Diabetes Mother     DIABETIC COMA    Review of Systems: The review of systems is per the HPI.  All other systems were reviewed and are negative.  Physical Exam: BP 200/100  Pulse 76  Ht 5' 9.5" (1.765 m)  Wt 201 lb 12.8 oz (91.536 kg)  BMI 29.38 kg/m2 BP is 202/120 by me.  Patient is alert and in no acute distress. Skin is warm and dry. Color is normal.  HEENT is unremarkable. Normocephalic/atraumatic. PERRL. Sclera are nonicteric. Neck is supple. No masses. No JVD. Lungs are clear. Cardiac exam shows a regular rate and rhythm. Abdomen is soft. Extremities are without edema. Gait and ROM are intact. No gross neurologic deficits noted.  LABORATORY DATA: BMET is pending   Lab Results  Component Value Date   GLUCOSE 179* 05/10/2013   CHOL 191 05/10/2013   TRIG 121.0 05/10/2013   HDL 44.40 05/10/2013   LDLDIRECT 149.4 01/07/2011   LDLCALC 122* 05/10/2013   ALT 16 05/10/2013   AST 21 05/10/2013   NA 137 05/10/2013   K 4.1 05/10/2013     CL 105 05/10/2013   CREATININE 1.2 05/10/2013   BUN 12 05/10/2013   CO2 24 05/10/2013   CARDIAC CATH FINAL INTERPRETATION FROM 2006:  1. Severe three-vessel obstructive atherosclerotic coronary disease.  2. Patent saphenous vein graft to the first large obtuse marginal vessel.  3. Patent saphenous vein graft to the posterior descending artery with very  small caliber graft throughout.  4. Occluded radial graft to the first marginal vessel.  5. Atretic left internal mammary artery graft to the diagonal.  6. Moderate to severe left ventricular dysfunction.  PLAN: The PDA of the right coronary has dual blood supply both from the  small caliber vein graft and the native vessel. This area does not appear to  be in jeopardy. The proximal and mid circumflex distribution are well  supplied by the native vessel and the distal circumflex including the fourth  and third marginal vessels are supplied by the functioning vein graft. The  LAD is chronic and totally occluded after the diagonal. Therefore the only  territory at risk at this point is the diagonal. There is a high-grade  ostial LAD stenosis obtunding this vessel. This lesion, I think, high risk  for catheter-based intervention given its ostial location and the potential  for compromise of the circumflex. I would recommend continued aggressive  medical therapy at this point, especially since the patient does not have  any active angina.  ______________________________  Peter M. Martinique, M.D.  PMJ/MEDQ D: 04/21/2005 T: 04/21/2005 Job: [id number]    Assessment / Plan:  1. HTN - need to once again, get him back on his medicines. Will give 0.1 mg of Clonidine here. Restart the hydralazine and the clonidine. See him in a week. Check BMET today - hope to stop potassium. He was monitored here in the office for one hour - BP down to 160/90. Advised him to stop at the drug store after leaving here today.   2. CAD - prior CABG - last cath in 2006 -  managed medically - no symptoms.   3. Noncompliance - does not seem to understand the significance of not taking his medicines.   Patient is agreeable to this plan and will call if any problems develop in the interim.   Burtis Junes, RN, [facility] [address] [place], [place]  [address] [phone]

## 2013-09-27 NOTE — Patient Instructions (Signed)
I am sending refills in back to the pharmacy for the clonidine and the hydralazine - go pick these up today  Follow this list of medicines  BP check in a week  See me in a month  We will check lab today  Call the [place] office at [phone] if you have any questions, problems or concerns.

## 2013-10-02 ENCOUNTER — Telehealth: Payer: Self-pay | Admitting: *Deleted

## 2013-10-02 NOTE — Telephone Encounter (Signed)
lmtcb for lab results, number provided

## 2013-10-11 ENCOUNTER — Ambulatory Visit (INDEPENDENT_AMBULATORY_CARE_PROVIDER_SITE_OTHER): Payer: Self-pay | Admitting: *Deleted

## 2013-10-11 VITALS — BP 170/80 | HR 102 | Resp 20

## 2013-10-11 DIAGNOSIS — I10 Essential (primary) hypertension: Secondary | ICD-10-CM

## 2013-10-11 MED ORDER — HYDRALAZINE HCL 50 MG PO TABS: 50.0000 mg | ORAL_TABLET | Freq: Three times a day (TID) | ORAL | Status: DC

## 2013-10-11 NOTE — Progress Notes (Signed)
Pt here for bp check. meds confirmed. Discussed with lori gerhardt np, pt instructed to increase hydralazine to 50 mg tid Pt has a follow up appt 10-24-13

## 2013-10-24 ENCOUNTER — Encounter: Payer: Self-pay | Admitting: Nurse Practitioner

## 2013-10-24 ENCOUNTER — Ambulatory Visit (INDEPENDENT_AMBULATORY_CARE_PROVIDER_SITE_OTHER): Payer: Self-pay | Admitting: Nurse Practitioner

## 2013-10-24 VITALS — BP 180/86 | HR 80 | Ht 69.5 in | Wt 198.8 lb

## 2013-10-24 DIAGNOSIS — I10 Essential (primary) hypertension: Secondary | ICD-10-CM

## 2013-10-24 DIAGNOSIS — Z91199 Patient's noncompliance with other medical treatment and regimen due to unspecified reason: Secondary | ICD-10-CM

## 2013-10-24 DIAGNOSIS — I259 Chronic ischemic heart disease, unspecified: Secondary | ICD-10-CM

## 2013-10-24 DIAGNOSIS — Z9119 Patient's noncompliance with other medical treatment and regimen: Secondary | ICD-10-CM

## 2013-10-24 LAB — BASIC METABOLIC PANEL
BUN: 13 mg/dL (ref 6–23)
CO2: 25 mEq/L (ref 19–32)
Calcium: 8.9 mg/dL (ref 8.4–10.5)
Chloride: 107 mEq/L (ref 96–112)
Creatinine, Ser: 1.1 mg/dL (ref 0.4–1.5)
GFR: 86.5 mL/min (ref >60.00)
Glucose, Bld: 160 mg/dL — ABNORMAL HIGH (ref 70–99)
Potassium: 3.7 mEq/L (ref 3.5–5.1)
Sodium: 137 mEq/L (ref 135–145)

## 2013-10-24 MED ORDER — POTASSIUM CHLORIDE CRYS ER 20 MEQ PO TBCR: 20.0000 meq | EXTENDED_RELEASE_TABLET | Freq: Every day | ORAL | Status: DC

## 2013-10-24 MED ORDER — RAMIPRIL 10 MG PO CAPS: 10.0000 mg | ORAL_CAPSULE | Freq: Two times a day (BID) | ORAL | Status: DC

## 2013-10-24 NOTE — Patient Instructions (Addendum)
Stay on your current medicines but we are increasing the Ramipril to two times a day  Cut your potassium back to just one pill a day  Stay on all your other medicines as well  We are rechecking labs today  See me in 3 to 4 weeks  Call the [place] office at [phone] if you have any questions, problems or concerns.

## 2013-10-24 NOTE — Progress Notes (Signed)
[name] Date of Birth: [birth year] Medical Record [id number]  History of Present Illness: [name] is seen back today for a 1 month check. Seen for Dr. Martinique. He has known CAD with remote CABG in 2002 per PVT, HTN, dilated CM, HLD and noncompliant with follow up in the past. Echo from 2009 showed dramatic improvement in his LV function and was normal. Last cath in 2006 - not a candidate for further revascularization. Remote CTA of the abdomen showed 50% left renal artery stenosis.   Seen back in November of 2014 after over a 2 year absence. BP markedly high - said he was taking - not clear how he was getting medicines refilled. Clonidine added. Did keep a follow up visit and BP was down. He was not taking his medicines as we had asked.   Seen back for follow up last month - he was  NOT taking the hydralazine or the clonidine - said his wife threw the bottles away when they were empty. Had been out of for several weeks. We got his medicines restarted.  Comes back today. Here alone.   Current Outpatient Prescriptions  Medication Sig Dispense Refill  . amLODipine (NORVASC) 10 MG tablet Take 10 mg by mouth daily.        [name] aspirin 81 MG tablet Take 81 mg by mouth daily.        [name] atorvastatin (LIPITOR) 40 MG tablet Take 1 tablet (40 mg total) by mouth daily.  30 tablet  6  . carvedilol (COREG) 25 MG tablet TAKE 1 TABLET TWICE DAILY WITH A MEAL.  60 tablet  1  . cloNIDine (CATAPRES) 0.1 MG tablet Take 1 tablet (0.1 mg total) by mouth at bedtime.  30 tablet  11  . furosemide (LASIX) 40 MG tablet Take 1 tablet (40 mg total) by mouth daily.  30 tablet  6  . hydrALAZINE (APRESOLINE) 50 MG tablet Take 1 tablet (50 mg total) by mouth 3 (three) times daily.  90 tablet  6  . isosorbide dinitrate (ISORDIL) 20 MG tablet TAKE 1 TABLET TWICE DAILY  60 tablet  1  . potassium chloride SA (K-DUR,KLOR-CON) 20 MEQ tablet Take 1 tablet (20 mEq total) by mouth 2 (two) times daily.  60 tablet  6  . ramipril  (ALTACE) 10 MG capsule TAKE 1 CAPSULE (10 MG TOTAL) BY MOUTH DAILY.  30 capsule  1  . spironolactone (ALDACTONE) 25 MG tablet Take 25 mg by mouth daily.         No current facility-administered medications for this visit.    No Known Allergies  Past Medical History  Diagnosis Date  . CAD (coronary artery disease)     CABG 2002  . Dilated cardiomyopathy     echo in 2009 showed improvement with a normal EF  . HTN (hypertension)   . Hyperlipemia   . PAD (peripheral artery disease)   . Chronic renal insufficiency   . Noncompliance     Past Surgical History  Procedure Laterality Date  . Coronary artery bypass graft  2002    x4 DR. VAN TRIGT  . Cardiac catheterization  2002, 2006    History  Smoking status  . Never Smoker   Smokeless tobacco  . Not on file    History  Alcohol Use No    Family History  Problem Relation Age of Onset  . Hypertension Father   . Diabetes Mother     DIABETIC COMA    Review of Systems: The  review of systems is per the HPI.  All other systems were reviewed and are negative.  Physical Exam: BP 180/86  Pulse 80  Ht 5' 9.5" (1.765 m)  Wt 198 lb 12.8 oz (90.175 kg)  BMI 28.95 kg/m2  SpO2 96% BP down to 164/80 by me.  Patient is very pleasant and in no acute distress. Skin is warm and dry. Color is normal.  HEENT is unremarkable. Normocephalic/atraumatic. PERRL. Sclera are nonicteric. Neck is supple. No masses. No JVD. Lungs are clear. Cardiac exam shows a regular rate and rhythm. Split S2 noted. Abdomen is soft. Extremities are without edema. Gait and ROM are intact. No gross neurologic deficits noted.  LABORATORY DATA: BMET is pending  Lab Results  Component Value Date   GLUCOSE 161* 09/27/2013   CHOL 191 05/10/2013   TRIG 121.0 05/10/2013   HDL 44.40 05/10/2013   LDLDIRECT 149.4 01/07/2011   LDLCALC 122* 05/10/2013   ALT 16 05/10/2013   AST 21 05/10/2013   NA 134* 09/27/2013   K 3.9 09/27/2013   CL 105 09/27/2013   CREATININE 1.3  09/27/2013   BUN 15 09/27/2013   CO2 24 09/27/2013   CARDIAC CATH FINAL INTERPRETATION FROM 2006:  1. Severe three-vessel obstructive atherosclerotic coronary disease.  2. Patent saphenous vein graft to the first large obtuse marginal vessel.  3. Patent saphenous vein graft to the posterior descending artery with very  small caliber graft throughout.  4. Occluded radial graft to the first marginal vessel.  5. Atretic left internal mammary artery graft to the diagonal.  6. Moderate to severe left ventricular dysfunction.  PLAN: The PDA of the right coronary has dual blood supply both from the  small caliber vein graft and the native vessel. This area does not appear to  be in jeopardy. The proximal and mid circumflex distribution are well  supplied by the native vessel and the distal circumflex including the fourth  and third marginal vessels are supplied by the functioning vein graft. The  LAD is chronic and totally occluded after the diagonal. Therefore the only  territory at risk at this point is the diagonal. There is a high-grade  ostial LAD stenosis obtunding this vessel. This lesion, I think, high risk  for catheter-based intervention given its ostial location and the potential  for compromise of the circumflex. I would recommend continued aggressive  medical therapy at this point, especially since the patient does not have  any active angina.  ______________________________  Peter M. Martinique, M.D.  PMJ/MEDQ D: 04/21/2005 T: 04/21/2005 Job: [id number]    Assessment / Plan:  1. HTN -  Not controlled - increasing his Altace to 10 mg BID. Cut the potassium back to just one a day. See again in 3 to 4 week. Check BMET today and again on return.  2. CAD - prior CABG - last cath in 2006 - managed medically - no symptoms.   3. Noncompliance   Patient is agreeable to this plan and will call if any problems develop in the interim.   Burtis Junes, RN, [facility]  [address] [place]  [place], [place] [address]  [phone]

## 2013-10-28 ENCOUNTER — Other Ambulatory Visit: Payer: Self-pay | Admitting: Cardiology

## 2013-11-21 ENCOUNTER — Ambulatory Visit: Payer: Self-pay | Admitting: Nurse Practitioner

## 2013-12-11 ENCOUNTER — Other Ambulatory Visit: Payer: Self-pay | Admitting: Cardiology

## 2014-01-02 ENCOUNTER — Ambulatory Visit (INDEPENDENT_AMBULATORY_CARE_PROVIDER_SITE_OTHER): Payer: Self-pay | Admitting: Nurse Practitioner

## 2014-01-02 ENCOUNTER — Encounter: Payer: Self-pay | Admitting: Nurse Practitioner

## 2014-01-02 VITALS — BP 170/80 | HR 75 | Ht 69.5 in | Wt 194.6 lb

## 2014-01-02 DIAGNOSIS — Z91199 Patient's noncompliance with other medical treatment and regimen due to unspecified reason: Secondary | ICD-10-CM

## 2014-01-02 DIAGNOSIS — I10 Essential (primary) hypertension: Secondary | ICD-10-CM

## 2014-01-02 DIAGNOSIS — I259 Chronic ischemic heart disease, unspecified: Secondary | ICD-10-CM

## 2014-01-02 DIAGNOSIS — Z9119 Patient's noncompliance with other medical treatment and regimen: Secondary | ICD-10-CM

## 2014-01-02 LAB — BASIC METABOLIC PANEL
BUN: 16 mg/dL (ref 6–23)
CO2: 27 mEq/L (ref 19–32)
Calcium: 9.1 mg/dL (ref 8.4–10.5)
Chloride: 106 mEq/L (ref 96–112)
Creatinine, Ser: 1.2 mg/dL (ref 0.4–1.5)
GFR: 82.2 mL/min (ref >60.00)
Glucose, Bld: 191 mg/dL — ABNORMAL HIGH (ref 70–99)
Potassium: 3.9 mEq/L (ref 3.5–5.1)
Sodium: 138 mEq/L (ref 135–145)

## 2014-01-02 NOTE — Patient Instructions (Addendum)
We will recheck lab today  Stay on your current medicines  I will see you in 3 months with fasting labs  We will check lab today (BMET)  Here are my tips to lose weight:  1. Drink only water. You do not need milk, juice, tea, soda or diet soda.  2. Do not eat anything "white". This includes white bread, potatoes, rice or mayo  3. Stay away from fried foods and sweets  4. Your portion should be the size of the palm of your hand.  5. Know what your weaknesses are and avoid.  6. Find an exercise you like and do it every day for 45 to 60 minutes.        Call the [place] office at [phone] if you have any questions, problems or concerns.

## 2014-01-02 NOTE — Progress Notes (Signed)
[name] Date of Birth: 1956-07-09 Medical Record [id number]  History of Present Illness: [name] is seen back today for a 2 month check. Seen for Dr. Martinique. He has known CAD with remote CABG in 2002 per PVT, HTN, dilated CM, HLD and noncompliant with follow up in the past. Echo from 2009 showed dramatic improvement in his LV function and was normal. Last cath in 2006 - not a candidate for further revascularization. Remote CTA of the abdomen showed 50% left renal artery stenosis from 2006.   Seen back in November of 2014 after over a 2 year absence. BP markedly high - said he was taking - not clear how he was getting medicines refilled. Clonidine added. Did keep a follow up visit and BP was down. He was not taking his medicines as we had asked.   Seen back several times in 2015 - not taking his medicines - compliance big issue.  Comes back today. Here alone. Feels good. No chest pain. Not short of breath. Not dizzy. Losing weight. Trying to eat better. Says he is taking his medicines.    Current Outpatient Prescriptions  Medication Sig Dispense Refill  . amLODipine (NORVASC) 10 MG tablet Take 10 mg by mouth daily.        [name] aspirin 81 MG tablet Take 81 mg by mouth daily.        [name] atorvastatin (LIPITOR) 40 MG tablet Take 1 tablet (40 mg total) by mouth daily.  30 tablet  6  . carvedilol (COREG) 25 MG tablet TAKE 1 TABLET TWICE DAILY WITH A MEAL.  60 tablet  1  . cloNIDine (CATAPRES) 0.1 MG tablet Take 1 tablet (0.1 mg total) by mouth at bedtime.  30 tablet  11  . furosemide (LASIX) 40 MG tablet Take 1 tablet (40 mg total) by mouth daily.  30 tablet  6  . hydrALAZINE (APRESOLINE) 50 MG tablet Take 1 tablet (50 mg total) by mouth 3 (three) times daily.  90 tablet  6  . isosorbide dinitrate (ISORDIL) 20 MG tablet TAKE 1 TABLET TWICE DAILY  60 tablet  3  . potassium chloride SA (K-DUR,KLOR-CON) 20 MEQ tablet Take 1 tablet (20 mEq total) by mouth daily.  60 tablet  6  . ramipril (ALTACE)  10 MG capsule Take 1 capsule (10 mg total) by mouth 2 (two) times daily.  60 capsule  6  . spironolactone (ALDACTONE) 25 MG tablet Take 25 mg by mouth daily.         No current facility-administered medications for this visit.    No Known Allergies  Past Medical History  Diagnosis Date  . CAD (coronary artery disease)     CABG 2002  . Dilated cardiomyopathy     echo in 2009 showed improvement with a normal EF  . HTN (hypertension)   . Hyperlipemia   . PAD (peripheral artery disease)   . Chronic renal insufficiency   . Noncompliance     Past Surgical History  Procedure Laterality Date  . Coronary artery bypass graft  2002    x4 DR. VAN TRIGT  . Cardiac catheterization  2002, 2006    History  Smoking status  . Never Smoker   Smokeless tobacco  . Not on file    History  Alcohol Use No    Family History  Problem Relation Age of Onset  . Hypertension Father   . Diabetes Mother     DIABETIC COMA    Review of Systems: The review  of systems is per the HPI.  All other systems were reviewed and are negative.  Physical Exam: BP 170/80  Pulse 75  Ht 5' 9.5" (1.765 m)  Wt 194 lb 9.6 oz (88.27 kg)  BMI 28.34 kg/m2  SpO2 99% Repeat BP by me is 140/70.  Patient is very pleasant and in no acute distress. Weight down 7 pounds since March. Skin is warm and dry. Color is normal.  HEENT is unremarkable. Normocephalic/atraumatic. PERRL. Sclera are nonicteric. Neck is supple. No masses. No JVD. Lungs are clear. Cardiac exam shows a regular rate and rhythm. Abdomen is soft. Extremities are without edema. Gait and ROM are intact. No gross neurologic deficits noted.  Wt Readings from Last 3 Encounters:  01/02/14 194 lb 9.6 oz (88.27 kg)  10/24/13 198 lb 12.8 oz (90.175 kg)  09/27/13 201 lb 12.8 oz (91.536 kg)     LABORATORY DATA: BMET pending  Lab Results  Component Value Date   GLUCOSE 160* 10/24/2013   CHOL 191 05/10/2013   TRIG 121.0 05/10/2013   HDL 44.40 05/10/2013     LDLDIRECT 149.4 01/07/2011   LDLCALC 122* 05/10/2013   ALT 16 05/10/2013   AST 21 05/10/2013   NA 137 10/24/2013   K 3.7 10/24/2013   CL 107 10/24/2013   CREATININE 1.1 10/24/2013   BUN 13 10/24/2013   CO2 25 10/24/2013     BNP (last 3 results) No results found for this basename: PROBNP,  in the last 8760 hours  CARDIAC CATH FINAL INTERPRETATION FROM 2006:  1. Severe three-vessel obstructive atherosclerotic coronary disease.  2. Patent saphenous vein graft to the first large obtuse marginal vessel.  3. Patent saphenous vein graft to the posterior descending artery with very  small caliber graft throughout.  4. Occluded radial graft to the first marginal vessel.  5. Atretic left internal mammary artery graft to the diagonal.  6. Moderate to severe left ventricular dysfunction.  PLAN: The PDA of the right coronary has dual blood supply both from the  small caliber vein graft and the native vessel. This area does not appear to  be in jeopardy. The proximal and mid circumflex distribution are well  supplied by the native vessel and the distal circumflex including the fourth  and third marginal vessels are supplied by the functioning vein graft. The  LAD is chronic and totally occluded after the diagonal. Therefore the only  territory at risk at this point is the diagonal. There is a high-grade  ostial LAD stenosis obtunding this vessel. This lesion, I think, high risk  for catheter-based intervention given its ostial location and the potential  for compromise of the circumflex. I would recommend continued aggressive  medical therapy at this point, especially since the patient does not have  any active angina.  ______________________________  Peter M. Martinique, M.D.  PMJ/MEDQ D: 04/21/2005 T: 04/21/2005 Job: [id number]   CTA Abd/pelvis Impression from May 2006: 1. 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 Multiple collaterals from the hypogastric  circulation  reconstitute the common femoral arteries. 2. Bilateral diffuse SFA disease with enlarged profunda femoris  collaterals. 3. Good three-vessel runoff bilaterally. 4. 15 mm low attenuation lesion from the lower pole left kidney, which is  not clearly a simple cyst by Hounsfield measurements. Recommend ultrasound to  help exclude small neoplasm.  5. Bilateral nephrolithiasis. 6. Mild plaque in the proximal SMA and proximal left renal artery.    Assessment / Plan:  1. HTN -  recheck by me is good. I have left him on his current regimen. He seems to have a better understanding of what it will take to get his BP controlled.   2. CAD - prior CABG - last cath in 2006 - managed medically - no symptoms.   3. Noncompliance   4. PVD  Patient is agreeable to this plan and will call if any problems develop in the interim.   Burtis Junes, RN, [facility]  [address] [place], [place] [address]  [phone]

## 2014-01-24 ENCOUNTER — Other Ambulatory Visit: Payer: Self-pay | Admitting: Cardiology

## 2014-04-05 ENCOUNTER — Ambulatory Visit: Payer: Self-pay | Admitting: Nurse Practitioner

## 2014-04-06 ENCOUNTER — Ambulatory Visit: Payer: Self-pay | Admitting: Nurse Practitioner

## 2014-04-13 ENCOUNTER — Ambulatory Visit (INDEPENDENT_AMBULATORY_CARE_PROVIDER_SITE_OTHER): Payer: Self-pay | Admitting: Nurse Practitioner

## 2014-04-13 ENCOUNTER — Encounter: Payer: Self-pay | Admitting: Nurse Practitioner

## 2014-04-13 VITALS — BP 180/80 | HR 68 | Ht 69.5 in | Wt 190.8 lb

## 2014-04-13 DIAGNOSIS — I701 Atherosclerosis of renal artery: Secondary | ICD-10-CM

## 2014-04-13 DIAGNOSIS — Z9119 Patient's noncompliance with other medical treatment and regimen: Secondary | ICD-10-CM

## 2014-04-13 DIAGNOSIS — Z91199 Patient's noncompliance with other medical treatment and regimen due to unspecified reason: Secondary | ICD-10-CM

## 2014-04-13 DIAGNOSIS — I259 Chronic ischemic heart disease, unspecified: Secondary | ICD-10-CM

## 2014-04-13 DIAGNOSIS — I10 Essential (primary) hypertension: Secondary | ICD-10-CM

## 2014-04-13 LAB — BASIC METABOLIC PANEL
BUN: 21 mg/dL (ref 6–23)
CO2: 23 mEq/L (ref 19–32)
Calcium: 8.8 mg/dL (ref 8.4–10.5)
Chloride: 108 mEq/L (ref 96–112)
Creatinine, Ser: 1.2 mg/dL (ref 0.4–1.5)
GFR: 77.51 mL/min (ref >60.00)
Glucose, Bld: 149 mg/dL — ABNORMAL HIGH (ref 70–99)
Potassium: 3.8 mEq/L (ref 3.5–5.1)
Sodium: 138 mEq/L (ref 135–145)

## 2014-04-13 NOTE — Patient Instructions (Addendum)
We will be checking the following labs today BMET  Stay on your current medicines  I would like to get a renal duplex study  See me in 3 months  Call the [place] office at [phone] if you have any questions, problems or concerns.

## 2014-04-13 NOTE — Progress Notes (Signed)
[name] Date of Birth: 1955/09/24 Medical Record [id number]  History of Present Illness: [name] is seen back today for a 6month check. Seen for Dr. Martinique. He has known CAD with remote CABG in 2002 per PVT, HTN, dilated CM, HLD and noncompliant with follow up in the past. Echo from 2009 showed dramatic improvement in his LV function and was normal. Last cath in 2006 - not a candidate for further revascularization. Remote CTA of the abdomen showed 50% left renal artery stenosis from 2006.   Seen back in November of 2014 after over a 2 year absence. BP markedly high - said he was taking - not clear how he was getting medicines refilled. Clonidine added. Did keep a follow up visit and BP was down. He was not taking his medicines as we had asked.   Seen back several times in 2015 - not taking his medicines - compliance big issue. Last seen by me back in June.   Comes back today. Here alone. He says he is doing well. No chest pain. Not short of breath. Says he is taking his medicines. No recent BP checks as an outpatient. Not dizzy or lightheaded. Feels pretty good. Denies claudication. More limited by knee pain and is using NSAID occasionally.    Current Outpatient Prescriptions  Medication Sig Dispense Refill  . amLODipine (NORVASC) 10 MG tablet Take 10 mg by mouth daily.        [name] aspirin 81 MG tablet Take 81 mg by mouth daily.        [name] atorvastatin (LIPITOR) 40 MG tablet Take 1 tablet (40 mg total) by mouth daily.  30 tablet  6  . carvedilol (COREG) 25 MG tablet TAKE 1 TABLET TWICE DAILY WITH A MEAL.  60 tablet  1  . cloNIDine (CATAPRES) 0.1 MG tablet Take 1 tablet (0.1 mg total) by mouth at bedtime.  30 tablet  11  . furosemide (LASIX) 40 MG tablet Take 1 tablet (40 mg total) by mouth daily.  30 tablet  6  . hydrALAZINE (APRESOLINE) 50 MG tablet Take 1 tablet (50 mg total) by mouth 3 (three) times daily.  90 tablet  6  . isosorbide dinitrate (ISORDIL) 20 MG tablet TAKE 1 TABLET  TWICE DAILY  60 tablet  3  . potassium chloride SA (K-DUR,KLOR-CON) 20 MEQ tablet Take 1 tablet (20 mEq total) by mouth daily.  60 tablet  6  . ramipril (ALTACE) 10 MG capsule Take 1 capsule (10 mg total) by mouth 2 (two) times daily.  60 capsule  6  . spironolactone (ALDACTONE) 25 MG tablet Take 25 mg by mouth daily.         No current facility-administered medications for this visit.    No Known Allergies  Past Medical History  Diagnosis Date  . CAD (coronary artery disease)     CABG 2002  . Dilated cardiomyopathy     echo in 2009 showed improvement with a normal EF  . HTN (hypertension)   . Hyperlipemia   . PAD (peripheral artery disease)   . Chronic renal insufficiency   . Noncompliance     Past Surgical History  Procedure Laterality Date  . Coronary artery bypass graft  2002    x4 DR. VAN TRIGT  . Cardiac catheterization  2002, 2006    History  Smoking status  . Never Smoker   Smokeless tobacco  . Not on file    History  Alcohol Use No    Family History  Problem Relation Age of Onset  . Hypertension Father   . Diabetes Mother     DIABETIC COMA    Review of Systems: The review of systems is per the HPI.  All other systems were reviewed and are negative.  Physical Exam: BP 180/80  Pulse 68  Ht 5' 9.5" (1.765 m)  Wt 190 lb 12.8 oz (86.546 kg)  BMI 27.78 kg/m2  SpO2 98% Patient is very pleasant and in no acute distress. Skin is warm and dry. Color is normal.  HEENT is unremarkable. Normocephalic/atraumatic. PERRL. Sclera are nonicteric. Neck is supple. No masses. No JVD. Lungs are clear. Cardiac exam shows a regular rate and rhythm. Abdomen is soft. No abdominal bruits noted. Extremities are without edema. Gait and ROM are intact. No gross neurologic deficits noted.  Wt Readings from Last 3 Encounters:  04/13/14 190 lb 12.8 oz (86.546 kg)  01/02/14 194 lb 9.6 oz (88.27 kg)  10/24/13 198 lb 12.8 oz (90.175 kg)    LABORATORY DATA/PROCEDURES:  Lab  Results  Component Value Date   GLUCOSE 191* 01/02/2014   CHOL 191 05/10/2013   TRIG 121.0 05/10/2013   HDL 44.40 05/10/2013   LDLDIRECT 149.4 01/07/2011   LDLCALC 122* 05/10/2013   ALT 16 05/10/2013   AST 21 05/10/2013   NA 138 01/02/2014   K 3.9 01/02/2014   CL 106 01/02/2014   CREATININE 1.2 01/02/2014   BUN 16 01/02/2014   CO2 27 01/02/2014    BNP (last 3 results) No results found for this basename: PROBNP,  in the last 8760 hours  CARDIAC CATH FINAL INTERPRETATION FROM 2006:  1. Severe three-vessel obstructive atherosclerotic coronary disease.  2. Patent saphenous vein graft to the first large obtuse marginal vessel.  3. Patent saphenous vein graft to the posterior descending artery with very  small caliber graft throughout.  4. Occluded radial graft to the first marginal vessel.  5. Atretic left internal mammary artery graft to the diagonal.  6. Moderate to severe left ventricular dysfunction.  PLAN: The PDA of the right coronary has dual blood supply both from the  small caliber vein graft and the native vessel. This area does not appear to  be in jeopardy. The proximal and mid circumflex distribution are well  supplied by the native vessel and the distal circumflex including the fourth  and third marginal vessels are supplied by the functioning vein graft. The  LAD is chronic and totally occluded after the diagonal. Therefore the only  territory at risk at this point is the diagonal. There is a high-grade  ostial LAD stenosis obtunding this vessel. This lesion, I think, high risk  for catheter-based intervention given its ostial location and the potential  for compromise of the circumflex. I would recommend continued aggressive  medical therapy at this point, especially since the patient does not have  any active angina.  ______________________________  Peter M. Martinique, M.D.  PMJ/MEDQ D: 04/21/2005 T: 04/21/2005 Job: [id number]   CTA Abd/pelvis Impression from May 2006: 1.  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 Multiple collaterals from the hypogastric circulation  reconstitute the common femoral arteries. 2. Bilateral diffuse SFA disease with enlarged profunda femoris  collaterals. 3. Good three-vessel runoff bilaterally. 4. 15 mm low attenuation lesion from the lower pole left kidney, which is  not clearly a simple cyst by Hounsfield measurements. Recommend ultrasound to  help exclude small neoplasm.  5. Bilateral nephrolithiasis. 6. Mild plaque in the proximal SMA and  proximal left renal artery.   Assessment / Plan:  1. HTN - recheck by me is 170/90. He says he is taking his medicines - would like to get a renal duplex on him. He is on a significant amount of antihypertensive medicines. See back in 3 months.    2. CAD - prior CABG - last cath in 2006 - managed medically - no symptoms.   3. Noncompliance   4. PVD   Patient is agreeable to this plan and will call if any problems develop in the interim.   Burtis Junes, RN, [facility] [address] [place], [place]  [address] [phone]

## 2014-05-11 ENCOUNTER — Encounter (HOSPITAL_COMMUNITY): Payer: Self-pay

## 2014-05-31 ENCOUNTER — Other Ambulatory Visit: Payer: Self-pay | Admitting: Nurse Practitioner

## 2014-06-13 ENCOUNTER — Other Ambulatory Visit: Payer: Self-pay

## 2014-06-13 MED ORDER — CARVEDILOL 25 MG PO TABS: ORAL_TABLET | ORAL | Status: DC

## 2014-06-13 MED ORDER — ISOSORBIDE DINITRATE 20 MG PO TABS: 20.0000 mg | ORAL_TABLET | Freq: Two times a day (BID) | ORAL | Status: DC

## 2014-06-30 ENCOUNTER — Other Ambulatory Visit: Payer: Self-pay | Admitting: Nurse Practitioner

## 2014-07-23 ENCOUNTER — Ambulatory Visit: Payer: Self-pay | Admitting: Nurse Practitioner

## 2014-07-31 ENCOUNTER — Ambulatory Visit: Payer: Self-pay | Admitting: Nurse Practitioner

## 2014-08-24 ENCOUNTER — Ambulatory Visit (INDEPENDENT_AMBULATORY_CARE_PROVIDER_SITE_OTHER): Payer: PRIVATE HEALTH INSURANCE | Admitting: Nurse Practitioner

## 2014-08-24 ENCOUNTER — Encounter: Payer: Self-pay | Admitting: Nurse Practitioner

## 2014-08-24 VITALS — BP 152/82 | HR 64 | Ht 69.5 in | Wt 186.2 lb

## 2014-08-24 DIAGNOSIS — I259 Chronic ischemic heart disease, unspecified: Secondary | ICD-10-CM

## 2014-08-24 DIAGNOSIS — I10 Essential (primary) hypertension: Secondary | ICD-10-CM

## 2014-08-24 LAB — BASIC METABOLIC PANEL
BUN: 21 mg/dL (ref 6–23)
CO2: 30 mEq/L (ref 19–32)
Calcium: 9.2 mg/dL (ref 8.4–10.5)
Chloride: 107 mEq/L (ref 96–112)
Creatinine, Ser: 1.18 mg/dL (ref 0.40–1.50)
GFR: 81.21 mL/min (ref >60.00)
Glucose, Bld: 115 mg/dL — ABNORMAL HIGH (ref 70–99)
Potassium: 4 mEq/L (ref 3.5–5.1)
Sodium: 138 mEq/L (ref 135–145)

## 2014-08-24 LAB — HEPATIC FUNCTION PANEL
ALT: 18 U/L (ref 0–53)
AST: 19 U/L (ref 0–37)
Albumin: 3.8 g/dL (ref 3.5–5.2)
Alkaline Phosphatase: 77 U/L (ref 39–117)
Bilirubin, Direct: 0.1 mg/dL (ref 0.0–0.3)
Total Bilirubin: 0.6 mg/dL (ref 0.2–1.2)
Total Protein: 8 g/dL (ref 6.0–8.3)

## 2014-08-24 LAB — LIPID PANEL
Cholesterol: 136 mg/dL (ref 0–200)
HDL: 36.8 mg/dL — ABNORMAL LOW (ref >39.00)
LDL Cholesterol: 80 mg/dL (ref 0–99)
NonHDL: 99.2
Total CHOL/HDL Ratio: 4
Triglycerides: 95 mg/dL (ref 0.0–149.0)
VLDL: 19 mg/dL (ref 0.0–40.0)

## 2014-08-24 NOTE — Progress Notes (Signed)
CARDIOLOGY OFFICE NOTE  Date:  08/24/2014    [name] Date of Birth: 1956-04-22 Medical Record [id number]  PCP:  No primary care provider on file.  Cardiologist:  Martinique    Chief Complaint  Patient presents with  . Hypertension    3 month check - seen for Dr. Martinique.     History of Present Illness: [name] is a 59 y.o. male who presents today for a 3 month check. Seen for Dr. Martinique. He has known CAD with remote CABG in 2002 per PVT, HTN, dilated CM, HLD and noncompliant with follow up in the past. Echo from 2009 showed dramatic improvement in his LV function and was normal. Last cath in 2006 - not a candidate for further revascularization.  Remote CTA of the abdomen showed 50% left renal artery stenosis from 2006.   Seen back in November of 2014 after over a 2 year absence. BP markedly high - said he was taking - not clear how he was getting medicines refilled. Clonidine added. Did keep a follow up visit and BP was down. He was not taking his medicines as we had asked.   Seen back several times in 2015 - not taking his medicines - compliance big issue. Last seen by me back in October. BP was still high. I ordered a renal duplex given his history of known RAS - he cancelled the study.  Comes back today. Here alone. He is doing pretty well. Says he is taking his medicines. BP in the 140 to 150's at home. He feels ok. No chest pain. Not short of breath. Losing weight actively by cutting back on soda and portions.   Past Medical History  Diagnosis Date  . CAD (coronary artery disease)     CABG 2002  . Dilated cardiomyopathy     echo in 2009 showed improvement with a normal EF  . HTN (hypertension)   . Hyperlipemia   . PAD (peripheral artery disease)   . Chronic renal insufficiency   . Noncompliance     Past Surgical History  Procedure Laterality Date  . Coronary artery bypass graft  2002    x4 DR. VAN TRIGT  . Cardiac catheterization  2002, 2006      Medications: Current Outpatient Prescriptions  Medication Sig Dispense Refill  . amLODipine (NORVASC) 10 MG tablet Take 10 mg by mouth daily.      [name] aspirin 81 MG tablet Take 81 mg by mouth daily.      [name] atorvastatin (LIPITOR) 40 MG tablet Take 1 tablet (40 mg total) by mouth daily. 30 tablet 6  . carvedilol (COREG) 25 MG tablet TAKE 1 TABLET TWICE DAILY WITH A MEAL. 60 tablet 3  . cloNIDine (CATAPRES) 0.1 MG tablet Take 1 tablet (0.1 mg total) by mouth at bedtime. 30 tablet 11  . furosemide (LASIX) 40 MG tablet Take 1 tablet (40 mg total) by mouth daily. 30 tablet 6  . hydrALAZINE (APRESOLINE) 50 MG tablet Take 1 tablet (50 mg total) by mouth 3 (three) times daily. 90 tablet 6  . isosorbide dinitrate (ISORDIL) 20 MG tablet Take 1 tablet (20 mg total) by mouth 2 (two) times daily. 60 tablet 3  . potassium chloride SA (K-DUR,KLOR-CON) 20 MEQ tablet Take 1 tablet (20 mEq total) by mouth daily. 60 tablet 6  . ramipril (ALTACE) 10 MG capsule TAKE 1 CAPSULE (10 MG TOTAL) BY MOUTH 2 (TWO) TIMES DAILY. 60 capsule 6  . spironolactone (ALDACTONE) 25 MG  tablet Take 25 mg by mouth daily.       No current facility-administered medications for this visit.    Allergies: No Known Allergies  Social History: The patient  reports that he has never smoked. He does not have any smokeless tobacco history on file. He reports that he does not drink alcohol or use illicit drugs.   Family History: The patient's family history includes Diabetes in his mother; Hypertension in his father.   Review of Systems: Please see the history of present illness.  He is actively losing weight.  All other systems are reviewed and negative.   Physical Exam: VS:  BP 152/82 mmHg  Pulse 64  Ht 5' 9.5" (1.765 m)  Wt 186 lb 4 oz (84.482 kg)  BMI 27.12 kg/m2 .  BMI Body mass index is 27.12 kg/(m^2).  Recheck BP by me is down to 140/90.   Wt Readings from Last 3 Encounters:  08/24/14 186 lb 4 oz (84.482 kg)  04/13/14  190 lb 12.8 oz (86.546 kg)  01/02/14 194 lb 9.6 oz (88.27 kg)    General: Pleasant. Well developed, well nourished and in no acute distress.  HEENT: Normal. Neck: Supple, no JVD, carotid bruits, or masses noted.  Cardiac: Regular rate and rhythm. No murmurs, rubs, or gallops. No edema.  Respiratory:  Lungs are clear to auscultation bilaterally with normal work of breathing.  GI: Soft and nontender.  MS: No deformity or atrophy. Gait and ROM intact. Skin: Warm and dry. Color is normal.  Neuro:  Strength and sensation are intact and no gross focal deficits noted.  Psych: Alert, appropriate and with normal affect.   LABORATORY DATA:  EKG:  EKG is ordered today. This demonstrates NSR - prior septal infarct, lateral T wave changes.  Lab Results  Component Value Date   GLUCOSE 149* 04/13/2014   CHOL 191 05/10/2013   TRIG 121.0 05/10/2013   HDL 44.40 05/10/2013   LDLDIRECT 149.4 01/07/2011   LDLCALC 122* 05/10/2013   ALT 16 05/10/2013   AST 21 05/10/2013   NA 138 04/13/2014   K 3.8 04/13/2014   CL 108 04/13/2014   CREATININE 1.2 04/13/2014   BUN 21 04/13/2014   CO2 23 04/13/2014    BNP (last 3 results) No results for input(s): BNP in the last 8760 hours.  ProBNP (last 3 results) No results for input(s): PROBNP in the last 8760 hours.   Other Studies Reviewed Today:  CARDIAC CATH FINAL INTERPRETATION FROM 2006:  1. Severe three-vessel obstructive atherosclerotic coronary disease.  2. Patent saphenous vein graft to the first large obtuse marginal vessel.  3. Patent saphenous vein graft to the posterior descending artery with very  small caliber graft throughout.  4. Occluded radial graft to the first marginal vessel.  5. Atretic left internal mammary artery graft to the diagonal.  6. Moderate to severe left ventricular dysfunction.  PLAN: The PDA of the right coronary has dual blood supply both from the  small caliber vein graft and the native vessel. This area  does not appear to  be in jeopardy. The proximal and mid circumflex distribution are well  supplied by the native vessel and the distal circumflex including the fourth  and third marginal vessels are supplied by the functioning vein graft. The  LAD is chronic and totally occluded after the diagonal. Therefore the only  territory at risk at this point is the diagonal. There is a high-grade  ostial LAD stenosis obtunding this vessel. This lesion, I  think, high risk  for catheter-based intervention given its ostial location and the potential  for compromise of the circumflex. I would recommend continued aggressive  medical therapy at this point, especially since the patient does not have  any active angina.  ______________________________  Peter M. Martinique, M.D.  PMJ/MEDQ D: 04/21/2005 T: 04/21/2005 Job: [id number]   CTA Abd/pelvis Impression from May 2006: 1. 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 Multiple collaterals from the hypogastric circulation  reconstitute the common femoral arteries. 2. Bilateral diffuse SFA disease with enlarged profunda femoris  collaterals. 3. Good three-vessel runoff bilaterally. 4. 15 mm low attenuation lesion from the lower pole left kidney, which is  not clearly a simple cyst by Hounsfield measurements. Recommend ultrasound to  help exclude small neoplasm.  5. Bilateral nephrolithiasis. 6. Mild plaque in the proximal SMA and proximal left renal artery.   Assessment / Plan:  1. HTN - he is doing well on his current regimen and BP has improved. Better assessment when he comes in the afternoon. I will see back in 6 months. Will hold on rescheduling renal duplex for now.    2. CAD - prior CABG - last cath in 2006 - managed medically - no symptoms.   3. Noncompliance   4. PVD   5. HLD - recheck labs today - on statin therapy.   Current medicines are reviewed with the patient today.  The patient does  not have concerns regarding medicines other than what has been noted above.  The following changes have been made:  See above.  Labs/ tests ordered today include:    Orders Placed This Encounter  Procedures  . Basic metabolic panel  . Hepatic function panel  . Lipid panel     Disposition:   FU with me in 6 months. Fasting labs today.   Patient is agreeable to this plan and will call if any problems develop in the interim.   Signed: Burtis Junes, RN, ANP-C 08/24/2014 3:28 PM  [facility] [address] [place], [place]  [address] Phone: [phone] Fax: [phone]

## 2014-08-24 NOTE — Patient Instructions (Signed)
We will be checking the following labs today BMET, HPF, Lipids  See me in 6 months  Stay on your medicines  Keep up the good work with losing weight  Call the [place] office at [phone] if you have any questions, problems or concerns.

## 2014-08-27 ENCOUNTER — Telehealth: Payer: Self-pay | Admitting: Nurse Practitioner

## 2014-08-27 NOTE — Telephone Encounter (Signed)
Notified of lab results. 

## 2014-08-27 NOTE — Telephone Encounter (Signed)
New problem    Pt returning call concerning lab results. Please call pt.

## 2014-09-14 ENCOUNTER — Encounter: Payer: Self-pay | Admitting: *Deleted

## 2014-09-17 ENCOUNTER — Other Ambulatory Visit: Payer: Self-pay | Admitting: Nurse Practitioner

## 2014-09-21 ENCOUNTER — Other Ambulatory Visit: Payer: Self-pay

## 2014-09-21 ENCOUNTER — Other Ambulatory Visit: Payer: Self-pay | Admitting: *Deleted

## 2014-09-21 MED ORDER — ISOSORBIDE DINITRATE 20 MG PO TABS: 20.0000 mg | ORAL_TABLET | Freq: Two times a day (BID) | ORAL | Status: DC

## 2014-09-21 MED ORDER — RAMIPRIL 10 MG PO CAPS: ORAL_CAPSULE | ORAL | Status: DC

## 2014-09-21 MED ORDER — CARVEDILOL 25 MG PO TABS: ORAL_TABLET | ORAL | Status: DC

## 2014-09-25 ENCOUNTER — Other Ambulatory Visit: Payer: Self-pay | Admitting: Nurse Practitioner

## 2014-11-15 ENCOUNTER — Other Ambulatory Visit: Payer: Self-pay | Admitting: *Deleted

## 2014-11-15 MED ORDER — HYDRALAZINE HCL 25 MG PO TABS: ORAL_TABLET | ORAL | Status: DC

## 2014-11-15 NOTE — Telephone Encounter (Signed)
Rx(s) sent to pharmacy electronically.  

## 2015-05-06 ENCOUNTER — Other Ambulatory Visit: Payer: Self-pay

## 2015-05-06 MED ORDER — CARVEDILOL 25 MG PO TABS: ORAL_TABLET | ORAL | Status: DC

## 2015-05-16 ENCOUNTER — Other Ambulatory Visit: Payer: Self-pay | Admitting: Cardiology

## 2015-05-16 MED ORDER — HYDRALAZINE HCL 25 MG PO TABS: ORAL_TABLET | ORAL | Status: DC

## 2015-07-01 ENCOUNTER — Other Ambulatory Visit: Payer: Self-pay

## 2015-07-01 MED ORDER — RAMIPRIL 10 MG PO CAPS: ORAL_CAPSULE | ORAL | Status: DC

## 2015-07-02 ENCOUNTER — Other Ambulatory Visit: Payer: Self-pay

## 2015-07-02 MED ORDER — RAMIPRIL 10 MG PO CAPS: 10.0000 mg | ORAL_CAPSULE | Freq: Two times a day (BID) | ORAL | Status: DC

## 2015-07-27 ENCOUNTER — Other Ambulatory Visit: Payer: Self-pay | Admitting: Nurse Practitioner

## 2015-08-24 ENCOUNTER — Other Ambulatory Visit: Payer: Self-pay | Admitting: Nurse Practitioner

## 2015-10-12 ENCOUNTER — Other Ambulatory Visit: Payer: Self-pay | Admitting: Nurse Practitioner

## 2015-11-18 ENCOUNTER — Other Ambulatory Visit: Payer: Self-pay

## 2015-11-18 MED ORDER — HYDRALAZINE HCL 25 MG PO TABS: ORAL_TABLET | ORAL | Status: DC

## 2015-11-18 NOTE — Telephone Encounter (Signed)
Rx Refill

## 2015-12-13 ENCOUNTER — Other Ambulatory Visit: Payer: Self-pay | Admitting: Nurse Practitioner

## 2015-12-18 ENCOUNTER — Other Ambulatory Visit: Payer: Self-pay | Admitting: Nurse Practitioner

## 2015-12-18 ENCOUNTER — Other Ambulatory Visit: Payer: Self-pay

## 2015-12-18 MED ORDER — ISOSORBIDE DINITRATE 20 MG PO TABS: 20.0000 mg | ORAL_TABLET | Freq: Two times a day (BID) | ORAL | Status: DC

## 2015-12-18 NOTE — Telephone Encounter (Signed)
Routed to [facility].

## 2015-12-25 ENCOUNTER — Other Ambulatory Visit: Payer: Self-pay | Admitting: *Deleted

## 2015-12-25 MED ORDER — HYDRALAZINE HCL 25 MG PO TABS: ORAL_TABLET | ORAL | Status: DC

## 2016-01-03 ENCOUNTER — Ambulatory Visit: Payer: PRIVATE HEALTH INSURANCE | Admitting: Nurse Practitioner

## 2016-01-07 ENCOUNTER — Other Ambulatory Visit: Payer: Self-pay | Admitting: Nurse Practitioner

## 2016-01-07 NOTE — Telephone Encounter (Signed)
This patient is past due for f/u with Cecille Rubin. Is this ok to refill?

## 2016-01-21 ENCOUNTER — Other Ambulatory Visit: Payer: Self-pay | Admitting: Cardiology

## 2016-02-08 ENCOUNTER — Other Ambulatory Visit: Payer: Self-pay | Admitting: Cardiology

## 2016-02-10 NOTE — Telephone Encounter (Signed)
REFILL 

## 2016-02-11 ENCOUNTER — Ambulatory Visit (INDEPENDENT_AMBULATORY_CARE_PROVIDER_SITE_OTHER): Payer: PRIVATE HEALTH INSURANCE | Admitting: Nurse Practitioner

## 2016-02-11 ENCOUNTER — Encounter: Payer: Self-pay | Admitting: Nurse Practitioner

## 2016-02-11 ENCOUNTER — Other Ambulatory Visit: Payer: Self-pay | Admitting: Nurse Practitioner

## 2016-02-11 ENCOUNTER — Ambulatory Visit (HOSPITAL_COMMUNITY)
Admission: RE | Admit: 2016-02-11 | Discharge: 2016-02-11 | Disposition: A | Discharge status: Home / Self Care | Payer: No Typology Code available for payment source | Source: Ambulatory Visit | Attending: Cardiology | Admitting: Cardiology

## 2016-02-11 VITALS — BP 200/120 | HR 80 | Ht 69.5 in | Wt 206.8 lb

## 2016-02-11 DIAGNOSIS — I259 Chronic ischemic heart disease, unspecified: Secondary | ICD-10-CM

## 2016-02-11 DIAGNOSIS — I10 Essential (primary) hypertension: Secondary | ICD-10-CM

## 2016-02-11 DIAGNOSIS — I119 Hypertensive heart disease without heart failure: Secondary | ICD-10-CM | POA: Insufficient documentation

## 2016-02-11 DIAGNOSIS — I420 Dilated cardiomyopathy: Secondary | ICD-10-CM | POA: Insufficient documentation

## 2016-02-11 DIAGNOSIS — I2510 Atherosclerotic heart disease of native coronary artery without angina pectoris: Secondary | ICD-10-CM | POA: Diagnosis not present

## 2016-02-11 DIAGNOSIS — E785 Hyperlipidemia, unspecified: Secondary | ICD-10-CM | POA: Insufficient documentation

## 2016-02-11 LAB — BASIC METABOLIC PANEL
BUN: 13 mg/dL (ref 7–25)
CO2: 22 mmol/L (ref 20–31)
Calcium: 8.9 mg/dL (ref 8.6–10.3)
Chloride: 102 mmol/L (ref 98–110)
Creat: 1.31 mg/dL — ABNORMAL HIGH (ref 0.70–1.25)
Glucose, Bld: 202 mg/dL — ABNORMAL HIGH (ref 65–99)
Potassium: 4 mmol/L (ref 3.5–5.3)
Sodium: 133 mmol/L — ABNORMAL LOW (ref 135–146)

## 2016-02-11 LAB — HEPATIC FUNCTION PANEL
ALT: 9 U/L (ref 9–46)
AST: 13 U/L (ref 10–35)
Albumin: 3.6 g/dL (ref 3.6–5.1)
Alkaline Phosphatase: 88 U/L (ref 40–115)
Bilirubin, Direct: 0.1 mg/dL (ref <=0.2)
Indirect Bilirubin: 0.6 mg/dL (ref 0.2–1.2)
Total Bilirubin: 0.7 mg/dL (ref 0.2–1.2)
Total Protein: 7.9 g/dL (ref 6.1–8.1)

## 2016-02-11 LAB — LIPID PANEL
Cholesterol: 160 mg/dL (ref 125–200)
HDL: 38 mg/dL — ABNORMAL LOW (ref >=40)
LDL Cholesterol: 98 mg/dL (ref <130)
Total CHOL/HDL Ratio: 4.2 Ratio (ref <=5.0)
Triglycerides: 118 mg/dL (ref <150)
VLDL: 24 mg/dL (ref <30)

## 2016-02-11 LAB — TSH: TSH: 0.55 mIU/L (ref 0.40–4.50)

## 2016-02-11 LAB — CBC
HCT: 35.1 % — ABNORMAL LOW (ref 38.5–50.0)
Hemoglobin: 11.7 g/dL — ABNORMAL LOW (ref 13.2–17.1)
MCH: 26.7 pg — ABNORMAL LOW (ref 27.0–33.0)
MCHC: 33.3 g/dL (ref 32.0–36.0)
MCV: 80 fL (ref 80.0–100.0)
MPV: 9.9 fL (ref 7.5–12.5)
Platelets: 246 10*3/uL (ref 140–400)
RBC: 4.39 MIL/uL (ref 4.20–5.80)
RDW: 15.6 % — ABNORMAL HIGH (ref 11.0–15.0)
WBC: 7.5 10*3/uL (ref 3.8–10.8)

## 2016-02-11 MED ORDER — CLONIDINE HCL 0.1 MG PO TABS
0.1000 mg | ORAL_TABLET | Freq: Once | ORAL | Status: AC
  Administered 2016-02-11: 0.1 mg via ORAL

## 2016-02-11 MED ORDER — CLONIDINE HCL 0.1 MG PO TABS: 0.1000 mg | ORAL_TABLET | Freq: Two times a day (BID) | ORAL | 11 refills | Status: DC

## 2016-02-11 NOTE — Patient Instructions (Addendum)
We will be checking the following labs today - BMET, CBC, HPF, Lipids and TSH   Medication Instructions:    Continue with your current medicines. BUT  I am increasing the Clonidine to twice a day - morning and night - this has been sent to your drug store.     Testing/Procedures To Be Arranged:  Renal duplex - try to do ASAP - this is for malignant HTN  Follow-Up:   See me in one week.     Other Special Instructions:   N/A    If you need a refill on your cardiac medications before your next appointment, please call your pharmacy.   Call the [place] office at [phone] if you have any questions, problems or concerns.

## 2016-02-11 NOTE — Progress Notes (Addendum)
CARDIOLOGY OFFICE NOTE  Date:  02/11/2016    [name] Date of Birth: Aug 22, 1955 Medical Record [id number]  PCP:  No primary care provider on file.  Cardiologist:  Latonia Conrow & [place]    Chief Complaint  Patient presents with  . Coronary Artery Disease  . Hypertension  . Hyperlipidemia    Follow up visit - seen for Dr. Martinique    History of Present Illness: [name] is a 60 y.o. male who presents today for a follow up visit. Seen for Dr. Martinique.   He has known CAD with remote CABG in 2002 per PVT, HTN, dilated CM, HLD and noncompliance with his follow up in the past. Echo from 2009 showed dramatic improvement in his LV function and was normal. Last cath in 2006 - not a candidate for further revascularization.  Remote CTA of the abdomen showed 50% left renal artery stenosis from 2006.   Seen back in November of 2014 after over a 2 year absence. BP markedly high - said he was taking - not clear how he was getting medicines refilled. Clonidine added. Did keep a follow up visit and BP was down - better when he is given an afternoon visit. He was not taking his medicines as we had asked.   Seen back several times in 2015 - not taking his medicines - compliance big issue. Last seen by me back in October. BP was still high. I ordered a renal duplex given his history of known RAS - he cancelled the study. Last seen by me back in February of 2016 - was doing ok. Actively losing weight.   Comes in today. Here alone. Tells me he is taking his medicines. BP sky high again. Says he just forgot to make an appointment. He says he took his medicines this morning - about an hour prior to the visit. Feels ok. No chest pain. Not short of breath. Walking "a little bit".     Past Medical History:  Diagnosis Date  . CAD (coronary artery disease)    CABG 2002  . Chronic renal insufficiency   . Dilated cardiomyopathy ([place])    echo in 2009 showed improvement with a normal EF  . HTN  (hypertension)   . Hyperlipemia   . Noncompliance   . PAD (peripheral artery disease) ([place])     Past Surgical History:  Procedure Laterality Date  . CARDIAC CATHETERIZATION  2002, 2006  . CORONARY ARTERY BYPASS GRAFT  2002   x4 DR. VAN TRIGT     Medications: Current Outpatient Prescriptions  Medication Sig Dispense Refill  . amLODipine (NORVASC) 10 MG tablet Take 10 mg by mouth daily.      [name] aspirin 81 MG tablet Take 81 mg by mouth daily.      [name] atorvastatin (LIPITOR) 40 MG tablet Take 1 tablet (40 mg total) by mouth daily. 30 tablet 6  . carvedilol (COREG) 25 MG tablet TAKE 1 TABLET (25 MG TOTAL) BY MOUTH 2 (TWO) TIMES DAILY WITH A MEAL. 30 tablet 1  . cloNIDine (CATAPRES) 0.1 MG tablet Take 1 tablet (0.1 mg total) by mouth at bedtime. 30 tablet 11  . furosemide (LASIX) 40 MG tablet Take 1 tablet (40 mg total) by mouth daily. 30 tablet 6  . hydrALAZINE (APRESOLINE) 25 MG tablet TAKE 1 TABLET (25 MG TOTAL) BY MOUTH 3 (THREE) TIMES DAILY. 90 tablet 0  . isosorbide dinitrate (ISORDIL) 20 MG tablet Take 1 tablet (20 mg total) by mouth 2 (  two) times daily. 60 tablet 6  . potassium chloride SA (K-DUR,KLOR-CON) 20 MEQ tablet Take 1 tablet (20 mEq total) by mouth daily. 60 tablet 6  . ramipril (ALTACE) 10 MG capsule TAKE 1 CAPSULE (10 MG TOTAL) BY MOUTH 2 (TWO) TIMES DAILY. 60 capsule 0  . spironolactone (ALDACTONE) 25 MG tablet Take 25 mg by mouth daily.       No current facility-administered medications for this visit.     Allergies: No Known Allergies  Social History: The patient  reports that he has never smoked. He does not have any smokeless tobacco history on file. He reports that he does not drink alcohol or use drugs.   Family History: The patient's family history includes Diabetes in his mother; Hypertension in his father.   Review of Systems: Please see the history of present illness.   Otherwise, the review of systems is positive for none.   All other systems are  reviewed and negative.   Physical Exam: VS:  BP (!) 200/120   Pulse 80   Ht 5' 9.5" (1.765 m)   Wt 206 lb 12.8 oz (93.8 kg)   BMI 30.10 kg/m  .  BMI Body mass index is 30.1 kg/m.  Wt Readings from Last 3 Encounters:  02/11/16 206 lb 12.8 oz (93.8 kg)  08/24/14 186 lb 4 oz (84.5 kg)  04/13/14 190 lb 12.8 oz (86.5 kg)    BP is 220/120 by me.   General: Pleasant. Well developed, well nourished and in no acute distress. He has gained weight - up 20 pounds.    HEENT: Normal.  Neck: Supple, no JVD, carotid bruits, or masses noted.  Cardiac: Regular rate and rhythm. No murmurs, rubs, or gallops. No edema.  Respiratory:  Lungs are clear to auscultation bilaterally with normal work of breathing.  GI: Soft and nontender.  MS: No deformity or atrophy. Gait and ROM intact.  Skin: Warm and dry. Color is normal.  Neuro:  Strength and sensation are intact and no gross focal deficits noted.  Psych: Alert, appropriate and with normal affect.   LABORATORY DATA:  EKG:  EKG is ordered today. This demonstrates NSR with septal Q's, lateral T wave changes - unchanged.  Lab Results  Component Value Date   GLUCOSE 115 (H) 08/24/2014   CHOL 136 08/24/2014   TRIG 95.0 08/24/2014   HDL 36.80 (L) 08/24/2014   LDLDIRECT 149.4 01/07/2011   LDLCALC 80 08/24/2014   ALT 18 08/24/2014   AST 19 08/24/2014   NA 138 08/24/2014   K 4.0 08/24/2014   CL 107 08/24/2014   CREATININE 1.18 08/24/2014   BUN 21 08/24/2014   CO2 30 08/24/2014    BNP (last 3 results) No results for input(s): BNP in the last 8760 hours.  ProBNP (last 3 results) No results for input(s): PROBNP in the last 8760 hours.   Other Studies Reviewed Today:  CARDIAC CATH FINAL INTERPRETATION FROM 2006:  1. Severe three-vessel obstructive atherosclerotic coronary disease.  2. Patent saphenous vein graft to the first large obtuse marginal vessel.  3. Patent saphenous vein graft to the posterior descending artery with very   small caliber graft throughout.  4. Occluded radial graft to the first marginal vessel.  5. Atretic left internal mammary artery graft to the diagonal.  6. Moderate to severe left ventricular dysfunction.  PLAN: The PDA of the right coronary has dual blood supply both from the  small caliber vein graft and the native vessel. This area does  not appear to  be in jeopardy. The proximal and mid circumflex distribution are well  supplied by the native vessel and the distal circumflex including the fourth  and third marginal vessels are supplied by the functioning vein graft. The  LAD is chronic and totally occluded after the diagonal. Therefore the only  territory at risk at this point is the diagonal. There is a high-grade  ostial LAD stenosis obtunding this vessel. This lesion, I think, high risk  for catheter-based intervention given its ostial location and the potential  for compromise of the circumflex. I would recommend continued aggressive  medical therapy at this point, especially since the patient does not have  any active angina.  ______________________________  Peter M. Martinique, M.D.  PMJ/MEDQ D: 04/21/2005 T: 04/21/2005 Job: [id number]   CTA Abd/pelvis Impression from May 2006: 1. 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 Multiple collaterals from the hypogastric circulation  reconstitute the common femoral arteries. 2. Bilateral diffuse SFA disease with enlarged profunda femoris  collaterals. 3. Good three-vessel runoff bilaterally. 4. 15 mm low attenuation lesion from the lower pole left kidney, which is  not clearly a simple cyst by Hounsfield measurements. Recommend ultrasound to  help exclude small neoplasm.  5. Bilateral nephrolithiasis. 6. Mild plaque in the proximal SMA and proximal left renal artery.   Assessment / Plan:  1. HTN - uncontrolled once again - he was given a dose of Clonidine 0.1 mg here in the  office given po. Will monitor here. Needs renal duplex - try to get ASAP. His noncompliance makes it very difficult to take care of him.   2. CAD - prior CABG - last cath in 2006 - managed medically - no symptoms.   3. Noncompliance - long standing problem  4. PVD with known RAS -  Needs renal duplex ordered.   5. HLD - recheck labs today - on statin therapy.   Current medicines are reviewed with the patient today.  The patient does not have concerns regarding medicines other than what has been noted above.  The following changes have been made:  See above.  Labs/ tests ordered today include:    Orders Placed This Encounter  Procedures  . Basic metabolic panel  . CBC  . Hepatic function panel  . Lipid panel  . TSH  . EKG 12-Lead     Disposition:   FU with me in one week.   Patient is agreeable to this plan and will call if any problems develop in the interim.   Signed: Burtis Junes, RN, ANP-C 02/11/2016 9:18 AM  [facility] [address] [place], [place]  [address] Phone: [phone] Fax: [phone]      Addendum: 45 minutes post clonidine - BP down to 160/90.  Will increase his Clonidine to BID. See back in one week. Have gotten renal doppler for later today.   Burtis Junes, RN, [facility] [address] [place],   [address] [phone].

## 2016-02-11 NOTE — Addendum Note (Signed)
Addended by: Harland German A on: 02/11/2016 09:28 AM   Modules accepted: Orders

## 2016-02-11 NOTE — Addendum Note (Signed)
Addended by: Burtis Junes on: 02/11/2016 09:56 AM   Modules accepted: Orders

## 2016-02-16 ENCOUNTER — Other Ambulatory Visit: Payer: Self-pay | Admitting: Cardiology

## 2016-02-17 ENCOUNTER — Encounter: Payer: Self-pay | Admitting: Nurse Practitioner

## 2016-02-17 ENCOUNTER — Ambulatory Visit (INDEPENDENT_AMBULATORY_CARE_PROVIDER_SITE_OTHER): Payer: PRIVATE HEALTH INSURANCE | Admitting: Nurse Practitioner

## 2016-02-17 VITALS — BP 168/88 | HR 70 | Ht 69.5 in | Wt 203.8 lb

## 2016-02-17 DIAGNOSIS — I119 Hypertensive heart disease without heart failure: Secondary | ICD-10-CM

## 2016-02-17 DIAGNOSIS — I10 Essential (primary) hypertension: Secondary | ICD-10-CM | POA: Diagnosis not present

## 2016-02-17 DIAGNOSIS — I259 Chronic ischemic heart disease, unspecified: Secondary | ICD-10-CM

## 2016-02-17 DIAGNOSIS — Z9119 Patient's noncompliance with other medical treatment and regimen: Secondary | ICD-10-CM

## 2016-02-17 DIAGNOSIS — Z91199 Patient's noncompliance with other medical treatment and regimen due to unspecified reason: Secondary | ICD-10-CM

## 2016-02-17 NOTE — Telephone Encounter (Signed)
Rx(s) sent to pharmacy electronically.  

## 2016-02-17 NOTE — Progress Notes (Signed)
CARDIOLOGY OFFICE NOTE  Date:  02/17/2016    [name] Date of Birth: 22-Nov-1955 Medical Record [id number]  PCP:  No PCP Per Patient  Cardiologist:  Servando Snare & [place]    Chief Complaint  Patient presents with  . Hypertension    One week check - seen for Dr. Martinique    History of Present Illness: [name] is a 60 y.o. male who presents today for a follow up visit. Seen for Dr. Martinique.   He has known CAD with remote CABG in 2002 per PVT, HTN, dilated CM, HLD and noncompliance with his follow up in the past. Echo from 2009 showed dramatic improvement in his LV function and was normal. Last cath in 2006 - not a candidate for further revascularization. Remote CTA of the abdomen showed 50% left renal artery stenosis from 2006.   Seen back in November of 2014 after over a 2 year absence. BP markedly high - said he was taking - not clear how he was getting medicines refilled. Clonidine added. Did keep a follow up visit and BP was down - better when he is given an afternoon visit. He was not taking his medicines as we had asked.   Seen back several times in 2015 - not taking his medicines - compliance big issue. Last seen by me back in October. BP was still high. I ordered a renal duplex given his history of known RAS - he cancelled that study.   I saw him last week - BP sky high again, had gained weight. Increased his Clonidine to BID and sent him for renal duplex.   Comes back today. Here alone. He has actually lost 3 pounds since last week. He is trying to change his diet. No problems with his medicines. He has taken them today. Feels fine. No chest pain. Not short of breath. Very mildly anemic on his labs - this was reported to him today. He does not have a PCP.  Past Medical History:  Diagnosis Date  . CAD (coronary artery disease)    CABG 2002  . Chronic renal insufficiency   . Dilated cardiomyopathy ([place])    echo in 2009 showed improvement with a normal EF    . HTN (hypertension)   . Hyperlipemia   . Noncompliance   . PAD (peripheral artery disease) ([place])     Past Surgical History:  Procedure Laterality Date  . CARDIAC CATHETERIZATION  2002, 2006  . CORONARY ARTERY BYPASS GRAFT  2002   x4 DR. VAN TRIGT     Medications: Current Outpatient Prescriptions  Medication Sig Dispense Refill  . amLODipine (NORVASC) 10 MG tablet Take 10 mg by mouth daily.      [name] aspirin 81 MG tablet Take 81 mg by mouth daily.      [name] atorvastatin (LIPITOR) 40 MG tablet Take 1 tablet (40 mg total) by mouth daily. 30 tablet 6  . carvedilol (COREG) 25 MG tablet TAKE 1 TABLET (25 MG TOTAL) BY MOUTH 2 (TWO) TIMES DAILY WITH A MEAL. 30 tablet 3  . cloNIDine (CATAPRES) 0.1 MG tablet Take 1 tablet (0.1 mg total) by mouth 2 (two) times daily. 60 tablet 11  . furosemide (LASIX) 40 MG tablet Take 1 tablet (40 mg total) by mouth daily. 30 tablet 6  . hydrALAZINE (APRESOLINE) 25 MG tablet TAKE 1 TABLET (25 MG TOTAL) BY MOUTH 3 (THREE) TIMES DAILY. 90 tablet 0  . isosorbide dinitrate (ISORDIL) 20 MG tablet Take 1 tablet (20  mg total) by mouth 2 (two) times daily. 60 tablet 6  . potassium chloride SA (K-DUR,KLOR-CON) 20 MEQ tablet Take 1 tablet (20 mEq total) by mouth daily. 60 tablet 6  . ramipril (ALTACE) 10 MG capsule TAKE 1 CAPSULE (10 MG TOTAL) BY MOUTH 2 (TWO) TIMES DAILY. 60 capsule 0  . spironolactone (ALDACTONE) 25 MG tablet Take 25 mg by mouth daily.       No current facility-administered medications for this visit.     Allergies: No Known Allergies  Social History: The patient  reports that he has never smoked. He does not have any smokeless tobacco history on file. He reports that he does not drink alcohol or use drugs.   Family History: The patient's family history includes Diabetes in his mother; Hypertension in his father.   Review of Systems: Please see the history of present illness.   Otherwise, the review of systems is positive for none.   All other  systems are reviewed and negative.   Physical Exam: VS:  BP (!) 168/88   Pulse 70   Ht 5' 9.5" (1.765 m)   Wt 203 lb 12.8 oz (92.4 kg)   SpO2 98% Comment: at rest  BMI 29.66 kg/m  .  BMI Body mass index is 29.66 kg/m.  Wt Readings from Last 3 Encounters:  02/17/16 203 lb 12.8 oz (92.4 kg)  02/11/16 206 lb 12.8 oz (93.8 kg)  08/24/14 186 lb 4 oz (84.5 kg)    His BP is down to 124/84 by me in the right arm   General: Pleasant. Well developed, well nourished and in no acute distress.   HEENT: Normal.  Neck: Supple, no JVD, carotid bruits, or masses noted.  Cardiac: Regular rate and rhythm. No murmurs, rubs, or gallops. No edema.  Respiratory:  Lungs are clear to auscultation bilaterally with normal work of breathing.  GI: Soft and nontender.  MS: No deformity or atrophy. Gait and ROM intact.  Skin: Warm and dry. Color is normal.  Neuro:  Strength and sensation are intact and no gross focal deficits noted.  Psych: Alert, appropriate and with normal affect.   LABORATORY DATA:  EKG:  EKG is not ordered today.  Lab Results  Component Value Date   WBC 7.5 02/11/2016   HGB 11.7 (L) 02/11/2016   HCT 35.1 (L) 02/11/2016   PLT 246 02/11/2016   GLUCOSE 202 (H) 02/11/2016   CHOL 160 02/11/2016   TRIG 118 02/11/2016   HDL 38 (L) 02/11/2016   LDLDIRECT 149.4 01/07/2011   LDLCALC 98 02/11/2016   ALT 9 02/11/2016   AST 13 02/11/2016   NA 133 (L) 02/11/2016   K 4.0 02/11/2016   CL 102 02/11/2016   CREATININE 1.31 (H) 02/11/2016   BUN 13 02/11/2016   CO2 22 02/11/2016   TSH 0.55 02/11/2016    BNP (last 3 results) No results for input(s): BNP in the last 8760 hours.  ProBNP (last 3 results) No results for input(s): PROBNP in the last 8760 hours.   Other Studies Reviewed Today:  CARDIAC CATH FINAL INTERPRETATION FROM 2006:  1. Severe three-vessel obstructive atherosclerotic coronary disease.  2. Patent saphenous vein graft to the first large obtuse marginal vessel.   3. Patent saphenous vein graft to the posterior descending artery with very  small caliber graft throughout.  4. Occluded radial graft to the first marginal vessel.  5. Atretic left internal mammary artery graft to the diagonal.  6. Moderate to severe left ventricular dysfunction.  PLAN: The PDA of the right coronary has dual blood supply both from the  small caliber vein graft and the native vessel. This area does not appear to  be in jeopardy. The proximal and mid circumflex distribution are well  supplied by the native vessel and the distal circumflex including the fourth  and third marginal vessels are supplied by the functioning vein graft. The  LAD is chronic and totally occluded after the diagonal. Therefore the only  territory at risk at this point is the diagonal. There is a high-grade  ostial LAD stenosis obtunding this vessel. This lesion, I think, high risk  for catheter-based intervention given its ostial location and the potential  for compromise of the circumflex.I would recommend continued aggressive  medical therapy at this point, especially since the patient does not have  any active angina.  ______________________________  Peter M. Martinique, M.D.  PMJ/MEDQ D: 04/21/2005 T: 04/21/2005 Job: [id number]   CTA Abd/pelvis Impression from May 2006: 1. 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 Multiple collaterals from the hypogastric circulation  reconstitute the common femoral arteries. 2. Bilateral diffuse SFA disease with enlarged profunda femoris  collaterals. 3. Good three-vessel runoff bilaterally. 4. 15 mm low attenuation lesion from the lower pole left kidney, which is  not clearly a simple cyst by Hounsfield measurements. Recommend ultrasound to  help exclude small neoplasm.  5. Bilateral nephrolithiasis. 6. Mild plaque in the proximal SMA and proximal left renal artery.  Assessment / Plan: 1. HTN -   Clonidine increased at his visit here last week - renal duplex was normal - BP has responded nicely with his current therapy.   2. CAD - prior CABG - last cath in 2006 - managed medically - no symptoms.   3. Noncompliance - long standing problem  4. PVD with known RAS -  negative renal duplex  5. HLD - remains on statin therapy.   6. Mildly anemic - he does not have PCP - will recheck on return visit.   Current medicines are reviewed with the patient today.  The patient does not have concerns regarding medicines other than what has been noted above.  The following changes have been made:  See above.  Labs/ tests ordered today include:   No orders of the defined types were placed in this encounter.    Disposition:   FU with me in 3 months.   Patient is agreeable to this plan and will call if any problems develop in the interim.   Signed: Burtis Junes, RN, ANP-C 02/17/2016 10:27 AM  [facility] [address] [place], [place]  [address] Phone: [phone] Fax: [phone]

## 2016-02-17 NOTE — Patient Instructions (Addendum)
We will be checking the following labs today - NONE   Medication Instructions:    Continue with your current medicines.     Testing/Procedures To Be Arranged:  N/A  Follow-Up:   See me in 3 months with labs    Other Special Instructions:   STAY ON TRACK!!    If you need a refill on your cardiac medications before your next appointment, please call your pharmacy.   Call the [place] office at [phone] if you have any questions, problems or concerns.

## 2016-03-27 ENCOUNTER — Other Ambulatory Visit: Payer: Self-pay | Admitting: Cardiology

## 2016-03-30 NOTE — Telephone Encounter (Signed)
Rx(s) sent to pharmacy electronically.  

## 2016-04-07 ENCOUNTER — Encounter: Payer: Self-pay | Admitting: *Deleted

## 2016-05-25 ENCOUNTER — Ambulatory Visit: Payer: PRIVATE HEALTH INSURANCE | Admitting: Nurse Practitioner

## 2016-05-29 ENCOUNTER — Ambulatory Visit: Payer: PRIVATE HEALTH INSURANCE | Admitting: Nurse Practitioner

## 2016-06-02 ENCOUNTER — Ambulatory Visit: Payer: PRIVATE HEALTH INSURANCE | Admitting: Nurse Practitioner

## 2016-06-02 ENCOUNTER — Other Ambulatory Visit: Payer: Self-pay | Admitting: Cardiology

## 2016-06-10 ENCOUNTER — Ambulatory Visit (INDEPENDENT_AMBULATORY_CARE_PROVIDER_SITE_OTHER): Payer: PRIVATE HEALTH INSURANCE | Admitting: Nurse Practitioner

## 2016-06-10 ENCOUNTER — Encounter (INDEPENDENT_AMBULATORY_CARE_PROVIDER_SITE_OTHER): Payer: Self-pay

## 2016-06-10 ENCOUNTER — Encounter: Payer: Self-pay | Admitting: Nurse Practitioner

## 2016-06-10 VITALS — BP 182/108 | HR 68 | Ht 69.5 in | Wt 199.0 lb

## 2016-06-10 DIAGNOSIS — Z9119 Patient's noncompliance with other medical treatment and regimen: Secondary | ICD-10-CM | POA: Diagnosis not present

## 2016-06-10 DIAGNOSIS — I119 Hypertensive heart disease without heart failure: Secondary | ICD-10-CM | POA: Diagnosis not present

## 2016-06-10 DIAGNOSIS — I10 Essential (primary) hypertension: Secondary | ICD-10-CM | POA: Diagnosis not present

## 2016-06-10 DIAGNOSIS — I259 Chronic ischemic heart disease, unspecified: Secondary | ICD-10-CM | POA: Diagnosis not present

## 2016-06-10 DIAGNOSIS — Z91199 Patient's noncompliance with other medical treatment and regimen due to unspecified reason: Secondary | ICD-10-CM

## 2016-06-10 LAB — CBC
HCT: 38 % — ABNORMAL LOW (ref 38.5–50.0)
Hemoglobin: 12.1 g/dL — ABNORMAL LOW (ref 13.2–17.1)
MCH: 26.5 pg — ABNORMAL LOW (ref 27.0–33.0)
MCHC: 31.8 g/dL — ABNORMAL LOW (ref 32.0–36.0)
MCV: 83.3 fL (ref 80.0–100.0)
MPV: 9.8 fL (ref 7.5–12.5)
Platelets: 218 10*3/uL (ref 140–400)
RBC: 4.56 MIL/uL (ref 4.20–5.80)
RDW: 16.7 % — ABNORMAL HIGH (ref 11.0–15.0)
WBC: 6.2 10*3/uL (ref 3.8–10.8)

## 2016-06-10 LAB — BASIC METABOLIC PANEL
BUN: 14 mg/dL (ref 7–25)
CO2: 24 mmol/L (ref 20–31)
Calcium: 9.3 mg/dL (ref 8.6–10.3)
Chloride: 105 mmol/L (ref 98–110)
Creat: 1.27 mg/dL — ABNORMAL HIGH (ref 0.70–1.25)
Glucose, Bld: 217 mg/dL — ABNORMAL HIGH (ref 65–99)
Potassium: 4.1 mmol/L (ref 3.5–5.3)
Sodium: 136 mmol/L (ref 135–146)

## 2016-06-10 NOTE — Progress Notes (Signed)
CARDIOLOGY OFFICE NOTE  Date:  06/10/2016    [name] Date of Birth: [birth year] Medical Record #[id number]  PCP:  No PCP Per Patient  Cardiologist:  [name] & [name]    Chief Complaint  Patient presents with  . Hypertension    3 month check - seen for Dr. Martinique    History of Present Illness: [name] is a 60 y.o. male who presents today for a 4 month check. Seen for Dr. Martinique.   He has known CAD with remote CABG in 2002 per PVT, HTN, dilated CM, HLD and noncompliancewith his follow up in the past. Echo from 2009 showed dramatic improvement in his LV function and was normal. Last cath in 2006 - not a candidate for further revascularization. Remote CTA of the abdomen showed 50% left renal artery stenosis from 2006.   Seen back in November of 2014 after over a 2 year absence. BP markedly high - said he was taking - not clear how he was getting medicines refilled. Clonidine added. Did keep a follow up visit and BP was down - better when he is given an afternoon visit. He was not taking his medicines as we had asked.   Seen back several times in 2015 - not taking his medicines - compliance big issue. Last seen by me back in October. BP was still high. I ordered a renal duplex given his history of known RAS - he cancelled that study.   I saw him twice back in August - BP sky high again, had gained weight. Increased his Clonidine to BID and sent him for renal duplex. This turned out to be ok. At last visit he was doing ok. BP had improved. No PCP.   Comes back today. Here alone. Only took his medicines about an hour before coming in for this visit. Has lost some weight - down 7 pounds over the past 3 months. Not checking his BP at home. Staying busy. Works at [employer]. No chest pain. Not short of breath. Says he feels good. Tells me that he did run out of his Coreg last week - just got back on yesterday. He says he is taking everything else. He is happy  with how he is doing.   Past Medical History:  Diagnosis Date  . CAD (coronary artery disease)    CABG 2002  . Chronic renal insufficiency   . Dilated cardiomyopathy ([place])    echo in 2009 showed improvement with a normal EF  . HTN (hypertension)   . Hyperlipemia   . Noncompliance   . PAD (peripheral artery disease) ([place])     Past Surgical History:  Procedure Laterality Date  . CARDIAC CATHETERIZATION  2002, 2006  . CORONARY ARTERY BYPASS GRAFT  2002   x4 [name]     Medications: Current Outpatient Prescriptions  Medication Sig Dispense Refill  . amLODipine (NORVASC) 10 MG tablet Take 10 mg by mouth daily.      [name] aspirin 81 MG tablet Take 81 mg by mouth daily.      [name] atorvastatin (LIPITOR) 40 MG tablet Take 1 tablet (40 mg total) by mouth daily. 30 tablet 6  . carvedilol (COREG) 25 MG tablet TAKE 1 TABLE BY MOUTH 2 TIMES DAILY WITH A MEAL. 30 tablet 3  . cloNIDine (CATAPRES) 0.1 MG tablet Take 1 tablet (0.1 mg total) by mouth 2 (two) times daily. 60 tablet 11  . furosemide (LASIX) 40 MG tablet Take 1  tablet (40 mg total) by mouth daily. 30 tablet 6  . hydrALAZINE (APRESOLINE) 25 MG tablet TAKE 1 TABLET (25 MG TOTAL) BY MOUTH 3 (THREE) TIMES DAILY. 90 tablet 6  . isosorbide dinitrate (ISORDIL) 20 MG tablet Take 1 tablet (20 mg total) by mouth 2 (two) times daily. 60 tablet 6  . potassium chloride SA (K-DUR,KLOR-CON) 20 MEQ tablet Take 1 tablet (20 mEq total) by mouth daily. 60 tablet 6  . ramipril (ALTACE) 10 MG capsule TAKE 1 CAPSULE TWICE A DAY 60 capsule 10  . spironolactone (ALDACTONE) 25 MG tablet Take 25 mg by mouth daily.       No current facility-administered medications for this visit.     Allergies: No Known Allergies  Social History: The patient  reports that he has never smoked. He does not have any smokeless tobacco history on file. He reports that he does not drink alcohol or use drugs.   Family History: The patient's family history includes Diabetes  in his mother; Hypertension in his father.   Review of Systems: Please see the history of present illness.   Otherwise, the review of systems is positive for none.   All other systems are reviewed and negative.   Physical Exam: VS:  BP (!) 182/108   Pulse 68   Ht 5' 9.5" (1.765 m)   Wt 199 lb (90.3 kg)   BMI 28.97 kg/m  .  BMI Body mass index is 28.97 kg/m.  Wt Readings from Last 3 Encounters:  06/10/16 199 lb (90.3 kg)  02/17/16 203 lb 12.8 oz (92.4 kg)  02/11/16 206 lb 12.8 oz (93.8 kg)    General: Pleasant. Well developed, well nourished and in no acute distress.   HEENT: Normal.  Neck: Supple, no JVD, carotid bruits, or masses noted.  Cardiac: Regular rate and rhythm. No murmurs, rubs, or gallops. No edema.  Respiratory:  Lungs are clear to auscultation bilaterally with normal work of breathing.  GI: Soft and nontender.  MS: No deformity or atrophy. Gait and ROM intact.  Skin: Warm and dry. Color is normal.  Neuro:  Strength and sensation are intact and no gross focal deficits noted.  Psych: Alert, appropriate and with normal affect.   LABORATORY DATA:  EKG:  EKG is not ordered today.  Lab Results  Component Value Date   WBC 7.5 02/11/2016   HGB 11.7 (L) 02/11/2016   HCT 35.1 (L) 02/11/2016   PLT 246 02/11/2016   GLUCOSE 202 (H) 02/11/2016   CHOL 160 02/11/2016   TRIG 118 02/11/2016   HDL 38 (L) 02/11/2016   LDLDIRECT 149.4 01/07/2011   LDLCALC 98 02/11/2016   ALT 9 02/11/2016   AST 13 02/11/2016   NA 133 (L) 02/11/2016   K 4.0 02/11/2016   CL 102 02/11/2016   CREATININE 1.31 (H) 02/11/2016   BUN 13 02/11/2016   CO2 22 02/11/2016   TSH 0.55 02/11/2016    BNP (last 3 results) No results for input(s): BNP in the last 8760 hours.  ProBNP (last 3 results) No results for input(s): PROBNP in the last 8760 hours.   Other Studies Reviewed Today:  CARDIAC CATH FINAL INTERPRETATION FROM 2006:  1. Severe three-vessel obstructive atherosclerotic coronary  disease.  2. Patent saphenous vein graft to the first large obtuse marginal vessel.  3. Patent saphenous vein graft to the posterior descending artery with very  small caliber graft throughout.  4. Occluded radial graft to the first marginal vessel.  5. Atretic left internal  mammary artery graft to the diagonal.  6. Moderate to severe left ventricular dysfunction.  PLAN: The PDA of the right coronary has dual blood supply both from the  small caliber vein graft and the native vessel. This area does not appear to  be in jeopardy. The proximal and mid circumflex distribution are well  supplied by the native vessel and the distal circumflex including the fourth  and third marginal vessels are supplied by the functioning vein graft. The  LAD is chronic and totally occluded after the diagonal. Therefore the only  territory at risk at this point is the diagonal. There is a high-grade  ostial LAD stenosis obtunding this vessel. This lesion, I think, high risk  for catheter-based intervention given its ostial location and the potential  for compromise of the circumflex.I would recommend continued aggressive  medical therapy at this point, especially since the patient does not have  any active angina.  ______________________________  Peter M. [name], M.D.  PMJ/MEDQ D: 04/21/2005 T: 04/21/2005 Job: [id number]   CTA Abd/pelvis Impression from May 2006: 1. 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 Multiple collaterals from the hypogastric circulation  reconstitute the common femoral arteries. 2. Bilateral diffuse SFA disease with enlarged profunda femoris  collaterals. 3. Good three-vessel runoff bilaterally. 4. 15 mm low attenuation lesion from the lower pole left kidney, which is  not clearly a simple cyst by Hounsfield measurements. Recommend ultrasound to  help exclude small neoplasm.  5. Bilateral nephrolithiasis. 6. Mild plaque in  the proximal SMA and proximal left renal artery.  Assessment / Plan: 1. HTN -   renal duplex was normal earlier this year - repeat BP by me is down to 160/94  2. CAD - prior CABG - last cath in 2006 - managed medically - no symptoms.   3. Noncompliance - long standing problem. He really needs to come later in the day for his appointments.   4. PVD with known RAS - negative renal duplex  5. HLD - remains on statin therapy. Labs from August noted.   6. Mildly anemic - he does not have PCP - will recheck today  7. Obesity - actively losing weight  Current medicines are reviewed with the patient today.  The patient does not have concerns regarding medicines other than what has been noted above.  The following changes have been made:  See above.  Labs/ tests ordered today include:    Orders Placed This Encounter  Procedures  . Basic metabolic panel  . CBC     Disposition:   FU with me in 3 months.   Patient is agreeable to this plan and will call if any problems develop in the interim.   Signed: Burtis Junes, RN, ANP-C 06/10/2016 8:19 AM  [facility] [address] [place], [place]  [address] Phone: [phone] Fax: [phone]

## 2016-06-10 NOTE — Patient Instructions (Addendum)
We will be checking the following labs today - BMET and CBC   Medication Instructions:    Continue with your current medicines.     Testing/Procedures To Be Arranged:  N/A  Follow-Up:   See me in 3 months - let's do a later appointment in the day.     Other Special Instructions:   Try to see the company nurse to check some BP's    If you need a refill on your cardiac medications before your next appointment, please call your pharmacy.   Call the [place] office at [phone] if you have any questions, problems or concerns.

## 2016-08-31 ENCOUNTER — Other Ambulatory Visit: Payer: Self-pay | Admitting: Cardiology

## 2016-09-01 ENCOUNTER — Ambulatory Visit: Payer: PRIVATE HEALTH INSURANCE | Admitting: Nurse Practitioner

## 2016-10-05 ENCOUNTER — Ambulatory Visit: Payer: PRIVATE HEALTH INSURANCE | Admitting: Nurse Practitioner

## 2016-10-07 ENCOUNTER — Other Ambulatory Visit: Payer: Self-pay | Admitting: Cardiology

## 2016-10-07 NOTE — Telephone Encounter (Signed)
Rx(s) sent to pharmacy electronically.  

## 2016-10-19 ENCOUNTER — Encounter: Payer: Self-pay | Admitting: Nurse Practitioner

## 2016-10-19 ENCOUNTER — Ambulatory Visit (INDEPENDENT_AMBULATORY_CARE_PROVIDER_SITE_OTHER): Payer: PRIVATE HEALTH INSURANCE | Admitting: Nurse Practitioner

## 2016-10-19 ENCOUNTER — Encounter (INDEPENDENT_AMBULATORY_CARE_PROVIDER_SITE_OTHER): Payer: Self-pay

## 2016-10-19 VITALS — BP 180/100 | HR 74 | Ht 69.5 in | Wt 199.0 lb

## 2016-10-19 DIAGNOSIS — Z91199 Patient's noncompliance with other medical treatment and regimen due to unspecified reason: Secondary | ICD-10-CM

## 2016-10-19 DIAGNOSIS — I10 Essential (primary) hypertension: Secondary | ICD-10-CM | POA: Diagnosis not present

## 2016-10-19 DIAGNOSIS — E7800 Pure hypercholesterolemia, unspecified: Secondary | ICD-10-CM | POA: Diagnosis not present

## 2016-10-19 DIAGNOSIS — I259 Chronic ischemic heart disease, unspecified: Secondary | ICD-10-CM

## 2016-10-19 DIAGNOSIS — I119 Hypertensive heart disease without heart failure: Secondary | ICD-10-CM | POA: Diagnosis not present

## 2016-10-19 DIAGNOSIS — Z9119 Patient's noncompliance with other medical treatment and regimen: Secondary | ICD-10-CM

## 2016-10-19 MED ORDER — HYDRALAZINE HCL 50 MG PO TABS: 50.0000 mg | ORAL_TABLET | Freq: Three times a day (TID) | ORAL | 3 refills | Status: DC

## 2016-10-19 NOTE — Progress Notes (Signed)
CARDIOLOGY OFFICE NOTE  Date:  10/19/2016    [name] Date of Birth: July 11, 1956 Medical Record #[id number]  PCP:  No PCP Per Patient  Cardiologist:  Servando Snare & Martinique    Chief Complaint  Patient presents with  . Coronary Artery Disease  . Hypertension    Follow up visit - seen for Dr. Martinique    History of Present Illness: [name] is a 61 y.o. male who presents today for a follow up visit. Seen for Dr. Martinique.   He has known CAD with remote CABG in 2002 per PVT, HTN, dilated CM, HLD and noncompliancewith his follow up in the past. Echo from 2009 showed dramatic improvement in his LV function and was normal. Last cath in 2006 - not a candidate for further revascularization. Remote CTA of the abdomen showed 50% left renal artery stenosis from 2006.   Seen back several times over the past few years - compliance with his medicines has always been an issue. Finally got him to go for renal duplex. This turned out to be ok. It has been challenging to get him to take his medicines consistently - he does better with afternoon appointments.   Comes back today. Here alone. Says he feels "great". Has his first grandson. He is pretty excited about that. No chest pain. Not short of breath. Says he is taking his medicines. Has not ran out of his medicines. Not checking his BP at home. 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 BP is pretty high here today. He does eat out some - went to [employer] this past weekend.   Past Medical History:  Diagnosis Date  . CAD (coronary artery disease)    CABG 2002  . Chronic renal insufficiency   . Dilated cardiomyopathy ([place])    echo in 2009 showed improvement with a normal EF  . HTN (hypertension)   . Hyperlipemia   . Noncompliance   . PAD (peripheral artery disease) ([place])     Past Surgical History:  Procedure Laterality Date  . CARDIAC CATHETERIZATION  2002, 2006  . CORONARY ARTERY BYPASS GRAFT   2002   x4 DR. VAN TRIGT     Medications: Current Outpatient Prescriptions  Medication Sig Dispense Refill  . amLODipine (NORVASC) 10 MG tablet Take 10 mg by mouth daily.      [name] aspirin 81 MG tablet Take 81 mg by mouth daily.      [name] atorvastatin (LIPITOR) 40 MG tablet Take 1 tablet (40 mg total) by mouth daily. 30 tablet 6  . carvedilol (COREG) 25 MG tablet TAKE 1 TABLE BY MOUTH 2 TIMES DAILY WITH A MEAL. 30 tablet 3  . cloNIDine (CATAPRES) 0.1 MG tablet Take 1 tablet (0.1 mg total) by mouth 2 (two) times daily. 60 tablet 11  . furosemide (LASIX) 40 MG tablet Take 1 tablet (40 mg total) by mouth daily. 30 tablet 6  . hydrALAZINE (APRESOLINE) 25 MG tablet TAKE 1 TABLET (25 MG TOTAL) BY MOUTH 3 (THREE) TIMES DAILY. 90 tablet 6  . isosorbide dinitrate (ISORDIL) 20 MG tablet TAKE 1 TABLET (20 MG TOTAL) BY MOUTH 2 (TWO) TIMES DAILY. 60 tablet 5  . potassium chloride SA (K-DUR,KLOR-CON) 20 MEQ tablet Take 1 tablet (20 mEq total) by mouth daily. 60 tablet 6  . ramipril (ALTACE) 10 MG capsule TAKE 1 CAPSULE TWICE A DAY 60 capsule 10  . spironolactone (ALDACTONE) 25 MG tablet Take 25 mg  by mouth daily.       No current facility-administered medications for this visit.     Allergies: No Known Allergies  Social History: The patient  reports that he has never smoked. He has never used smokeless tobacco. He reports that he does not drink alcohol or use drugs.   Family History: The patient's family history includes Diabetes in his mother; Hypertension in his father.   Review of Systems: Please see the history of present illness.   Otherwise, the review of systems is positive for none.   All other systems are reviewed and negative.   Physical Exam: VS:  BP (!) 180/100   Pulse 74   Ht 5' 9.5" (1.765 m)   Wt 199 lb (90.3 kg)   SpO2 98% Comment: at rest  BMI 28.97 kg/m  .  BMI Body mass index is 28.97 kg/m.  Wt Readings from Last 3 Encounters:  10/19/16 199 lb (90.3 kg)  06/10/16 199 lb  (90.3 kg)  02/17/16 203 lb 12.8 oz (92.4 kg)   BP is 170/110 by me.   General: Pleasant. Well developed, well nourished and in no acute distress.   HEENT: Normal.  Neck: Supple, no JVD, carotid bruits, or masses noted.  Cardiac: Regular rate and rhythm. No murmurs, rubs, or gallops. No edema.  Respiratory:  Lungs are clear to auscultation bilaterally with normal work of breathing.  GI: Soft and nontender.  MS: No deformity or atrophy. Gait and ROM intact.  Skin: Warm and dry. Color is normal.  Neuro:  Strength and sensation are intact and no gross focal deficits noted.  Psych: Alert, appropriate and with normal affect.   LABORATORY DATA:  EKG:  EKG is not ordered today.  Lab Results  Component Value Date   WBC 6.2 06/10/2016   HGB 12.1 (L) 06/10/2016   HCT 38.0 (L) 06/10/2016   PLT 218 06/10/2016   GLUCOSE 217 (H) 06/10/2016   CHOL 160 02/11/2016   TRIG 118 02/11/2016   HDL 38 (L) 02/11/2016   LDLDIRECT 149.4 01/07/2011   LDLCALC 98 02/11/2016   ALT 9 02/11/2016   AST 13 02/11/2016   NA 136 06/10/2016   K 4.1 06/10/2016   CL 105 06/10/2016   CREATININE 1.27 (H) 06/10/2016   BUN 14 06/10/2016   CO2 24 06/10/2016   TSH 0.55 02/11/2016    BNP (last 3 results) No results for input(s): BNP in the last 8760 hours.  ProBNP (last 3 results) No results for input(s): PROBNP in the last 8760 hours.   Other Studies Reviewed Today:  CARDIAC CATH FINAL INTERPRETATION FROM 2006:  1. Severe three-vessel obstructive atherosclerotic coronary disease.  2. Patent saphenous vein graft to the first large obtuse marginal vessel.  3. Patent saphenous vein graft to the posterior descending artery with very  small caliber graft throughout.  4. Occluded radial graft to the first marginal vessel.  5. Atretic left internal mammary artery graft to the diagonal.  6. Moderate to severe left ventricular dysfunction.  PLAN: The PDA of the right coronary has dual blood supply both  from the  small caliber vein graft and the native vessel. This area does not appear to  be in jeopardy. The proximal and mid circumflex distribution are well  supplied by the native vessel and the distal circumflex including the fourth  and third marginal vessels are supplied by the functioning vein graft. The  LAD is chronic and totally occluded after the diagonal. Therefore the only  territory  at risk at this point is the diagonal. There is a high-grade  ostial LAD stenosis obtunding this vessel. This lesion, I think, high risk  for catheter-based intervention given its ostial location and the potential  for compromise of the circumflex.I would recommend continued aggressive  medical therapy at this point, especially since the patient does not have  any active angina.  ______________________________  Peter M. Martinique, M.D.  PMJ/MEDQ D: 04/21/2005 T: 04/21/2005 Job: [id number]   CTA Abd/pelvis Impression from May 2006: 1. 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 Multiple collaterals from the hypogastric circulation  reconstitute the common femoral arteries. 2. Bilateral diffuse SFA disease with enlarged profunda femoris  collaterals. 3. Good three-vessel runoff bilaterally. 4. 15 mm low attenuation lesion from the lower pole left kidney, which is  not clearly a simple cyst by Hounsfield measurements. Recommend ultrasound to  help exclude small neoplasm.  5. Bilateral nephrolithiasis. 6. Mild plaque in the proximal SMA and proximal left renal artery.  Assessment / Plan: 1. HTN -  prior renal duplex was normal - BP is not at goal - he is pretty adamant that he is taking his medicines as recommended. Hard to gauge how much salt he is getting. Increasing the Hydralazine to 50 mg TID. Will get him to the HTN clinic for further adjustment. Lab today.   2. CAD - prior CABG - last cath in 2006 - managed medically - no symptoms.   3.  Noncompliance - long standing problem.   4. PVD with known RAS - negative renal duplex  5. HLD - remainson statin therapy. Lab today.  6. Mildly anemic   7. Obesity   Current medicines are reviewed with the patient today.  The patient does not have concerns regarding medicines other than what has been noted above.  The following changes have been made:  See above.  Labs/ tests ordered today include:   No orders of the defined types were placed in this encounter.    Disposition:   FU with me in 6 months.   Patient is agreeable to this plan and will call if any problems develop in the interim.   SignedTruitt Merle, NP  10/19/2016 2:57 PM  [facility] [address] [place], [place]  [address] Phone: [phone] Fax: [phone]

## 2016-10-19 NOTE — Patient Instructions (Addendum)
We will be checking the following labs today - BMET, lipids, HPF   Medication Instructions:    Continue with your current medicines. BUT  I am increasing the Hydralazine today to 50 mg to take three times a day - you can take 2 of your 25 mg tablets three times a day and use those up - I have sent the RX for the 50 mg to your drug store.     Testing/Procedures To Be Arranged:  N/A  Follow-Up:   See me in 3 months  HTN clinic visit next week please    Other Special Instructions:   N/A    If you need a refill on your cardiac medications before your next appointment, please call your pharmacy.   Call the [place] office at [phone] if you have any questions, problems or concerns.

## 2016-10-20 ENCOUNTER — Other Ambulatory Visit: Payer: Self-pay | Admitting: *Deleted

## 2016-10-20 DIAGNOSIS — R7309 Other abnormal glucose: Secondary | ICD-10-CM

## 2016-10-20 LAB — BASIC METABOLIC PANEL
BUN/Creatinine Ratio: 11 (ref 10–24)
BUN: 14 mg/dL (ref 8–27)
CO2: 22 mmol/L (ref 18–29)
Calcium: 9 mg/dL (ref 8.6–10.2)
Chloride: 103 mmol/L (ref 96–106)
Creatinine, Ser: 1.23 mg/dL (ref 0.76–1.27)
GFR calc Af Amer: 73 mL/min/{1.73_m2} (ref >59)
GFR calc non Af Amer: 63 mL/min/{1.73_m2} (ref >59)
Glucose: 234 mg/dL — ABNORMAL HIGH (ref 65–99)
Potassium: 3.6 mmol/L (ref 3.5–5.2)
Sodium: 138 mmol/L (ref 134–144)

## 2016-10-20 LAB — HEPATIC FUNCTION PANEL
ALT: 11 IU/L (ref 0–44)
AST: 12 IU/L (ref 0–40)
Albumin: 3.6 g/dL (ref 3.6–4.8)
Alkaline Phosphatase: 98 IU/L (ref 39–117)
Bilirubin Total: 0.8 mg/dL (ref 0.0–1.2)
Bilirubin, Direct: 0.2 mg/dL (ref 0.00–0.40)
Total Protein: 8.3 g/dL (ref 6.0–8.5)

## 2016-10-20 LAB — LIPID PANEL
Chol/HDL Ratio: 4.6 ratio (ref 0.0–5.0)
Cholesterol, Total: 169 mg/dL (ref 100–199)
HDL: 37 mg/dL — ABNORMAL LOW (ref >39)
LDL Calculated: 108 mg/dL — ABNORMAL HIGH (ref 0–99)
Triglycerides: 119 mg/dL (ref 0–149)
VLDL Cholesterol Cal: 24 mg/dL (ref 5–40)

## 2016-10-29 ENCOUNTER — Ambulatory Visit: Payer: PRIVATE HEALTH INSURANCE

## 2016-10-29 ENCOUNTER — Other Ambulatory Visit: Payer: PRIVATE HEALTH INSURANCE

## 2016-11-09 ENCOUNTER — Other Ambulatory Visit (INDEPENDENT_AMBULATORY_CARE_PROVIDER_SITE_OTHER): Payer: PRIVATE HEALTH INSURANCE

## 2016-11-09 ENCOUNTER — Ambulatory Visit (INDEPENDENT_AMBULATORY_CARE_PROVIDER_SITE_OTHER): Payer: PRIVATE HEALTH INSURANCE | Admitting: Pharmacist

## 2016-11-09 VITALS — BP 162/88 | HR 75

## 2016-11-09 DIAGNOSIS — R7309 Other abnormal glucose: Secondary | ICD-10-CM | POA: Diagnosis not present

## 2016-11-09 DIAGNOSIS — I10 Essential (primary) hypertension: Secondary | ICD-10-CM

## 2016-11-09 DIAGNOSIS — E782 Mixed hyperlipidemia: Secondary | ICD-10-CM | POA: Diagnosis not present

## 2016-11-09 MED ORDER — ATORVASTATIN CALCIUM 80 MG PO TABS: 80.0000 mg | ORAL_TABLET | Freq: Every day | ORAL | 3 refills | Status: DC

## 2016-11-09 MED ORDER — HYDRALAZINE HCL 100 MG PO TABS: 100.0000 mg | ORAL_TABLET | Freq: Three times a day (TID) | ORAL | 3 refills | Status: DC

## 2016-11-09 MED ORDER — LISINOPRIL 40 MG PO TABS: 40.0000 mg | ORAL_TABLET | Freq: Every day | ORAL | 3 refills | Status: DC

## 2016-11-09 NOTE — Progress Notes (Signed)
Patient ID: [name]                 DOB: December 19, 1955                      MRN: [id number]     HPI: [name] is a 61 y.o. male patient of Dr Martinique referred by Truitt Merle, NP to HTN clinic. PMH is significant for HTN, CAD s/p CABG in 2002, PAD, dilated CM, HLD, and medication noncompliance. Remote CTA of the abdomen showed 50% left renal artery stenosis from 2006. At last visit, BP was elevated to 180/100 which is normal for pt and hydralazine was increased to 50mg  TID. Pt presents today for follow up.  Pt denies dizziness, blurred vision, headaches, or falls. He does not check his blood pressure at home. He does report compliance with his medications. He has tried to cut back on caffeinated soda and tries to shop for low sodium foods. He walks frequently. He states that his ramipril is expensive and is $143 for a 3 month supply.  Current HTN meds: amlodipine 10mg  daily, carvedilol 25mg  BID, ramipril 10mg  BID, clonidine 0.1mg  BID, furosemide 40mg  daily, hydralazine 50mg  TID, isosorbide 20mg  BID, spironolactone 25mg  daily BP goal: <130/2mmHg  Family History: The patient's family history includes Diabetes in his mother; Hypertension in his father.  Social History: The patient  reports that he has never smoked. He has never used smokeless tobacco. He reports that he does not drink alcohol or use drugs.  Diet: Looks for low sodium hamburger. Likes fish. Cut back on caffeinated soda.  Exercise: Walks every day and stays on his feet all day.  Home BP readings: Does not check at home.  Wt Readings from Last 3 Encounters:  10/19/16 199 lb (90.3 kg)  06/10/16 199 lb (90.3 kg)  02/17/16 203 lb 12.8 oz (92.4 kg)   BP Readings from Last 3 Encounters:  10/19/16 (!) 180/100  06/10/16 (!) 182/108  02/17/16 (!) 168/88   Pulse Readings from Last 3 Encounters:  10/19/16 74  06/10/16 68  02/17/16 70    Renal function: CrCl cannot be calculated (Unknown ideal weight.).  Past  Medical History:  Diagnosis Date  . CAD (coronary artery disease)    CABG 2002  . Chronic renal insufficiency   . Dilated cardiomyopathy ([place])    echo in 2009 showed improvement with a normal EF  . HTN (hypertension)   . Hyperlipemia   . Noncompliance   . PAD (peripheral artery disease) ([place])     Current Outpatient Prescriptions on File Prior to Visit  Medication Sig Dispense Refill  . amLODipine (NORVASC) 10 MG tablet Take 10 mg by mouth daily.      [name] aspirin 81 MG tablet Take 81 mg by mouth daily.      [name] atorvastatin (LIPITOR) 40 MG tablet Take 1 tablet (40 mg total) by mouth daily. 30 tablet 6  . carvedilol (COREG) 25 MG tablet TAKE 1 TABLE BY MOUTH 2 TIMES DAILY WITH A MEAL. 30 tablet 3  . cloNIDine (CATAPRES) 0.1 MG tablet Take 1 tablet (0.1 mg total) by mouth 2 (two) times daily. 60 tablet 11  . furosemide (LASIX) 40 MG tablet Take 1 tablet (40 mg total) by mouth daily. 30 tablet 6  . hydrALAZINE (APRESOLINE) 50 MG tablet Take 1 tablet (50 mg total) by mouth 3 (three) times daily. 270 tablet 3  . isosorbide dinitrate (ISORDIL) 20 MG tablet TAKE 1 TABLET (20 MG  TOTAL) BY MOUTH 2 (TWO) TIMES DAILY. 60 tablet 5  . potassium chloride SA (K-DUR,KLOR-CON) 20 MEQ tablet Take 1 tablet (20 mEq total) by mouth daily. 60 tablet 6  . ramipril (ALTACE) 10 MG capsule TAKE 1 CAPSULE TWICE A DAY 60 capsule 10  . spironolactone (ALDACTONE) 25 MG tablet Take 25 mg by mouth daily.       No current facility-administered medications on file prior to visit.     No Known Allergies   Assessment/Plan:  1. Resistant hypertension - BP improved to 162/88 with recent hydralazine dose increase but still above goal < 130/78mmHg. Will increase hydralazine to 100mg  TID. Ramipril is cost prohibitive for pt at $143 for a 3 month supply. Will stop ramipril and start lisinopril 40mg  daily. Pt to continue on amlodipine 10mg  daily, carvedilol 25mg  BID, clonidine 0.1mg  BID, furosemide 40mg  daily, isosorbide 20mg   BID, and spironolactone 25mg  daily. He does report compliance with medications. Follow up BP check in 1 month, will plan to increase spironolactone if needed.  2. Hyperlipidemia - LDL 108mg /dL on Lipitor 40mg  daily above goal < 70mg /dL given hx of CAD s/p CABG and PAD. Will increase Lipitor to 80mg  daily. Pt has f/u visit with Truitt Merle in 2-3 months, will plan to check lipids at that time.  F/u in 4 weeks for BP check.   Quiara Killian E. Halvor Behrend, PharmD, CPP, [facility] [address], [place], [place] [address] Phone: [phone]; Fax: [phone] 11/09/2016 3:30 PM

## 2016-11-09 NOTE — Patient Instructions (Addendum)
Increase your hydralazine to 100mg  3 times a day. You can take 2 of your 50mg  tablets 3 times a day until you run out, then pick up the new prescription for 100mg  tablets and take 1 tablet 3 times a day.  Stop taking ramipril. Start taking lisinopril 40mg  once a day - this will be cheaper for you.  Increase your Lipitor (atorvastatin) to 80mg  daily to help lower your cholesterol. You can take 2 of your 40mg  tablets daily until you run out.  Continue all your other medicines - goal blood pressure is less than 130/80  Recheck blood pressure in 4 weeks.

## 2016-11-09 NOTE — Progress Notes (Signed)
Thanks for seeing him! 

## 2016-11-10 ENCOUNTER — Other Ambulatory Visit: Payer: Self-pay | Admitting: *Deleted

## 2016-11-10 DIAGNOSIS — R7309 Other abnormal glucose: Secondary | ICD-10-CM

## 2016-11-10 LAB — HEMOGLOBIN A1C
Est. average glucose Bld gHb Est-mCnc: 206 mg/dL
Hgb A1c MFr Bld: 8.8 % — ABNORMAL HIGH (ref 4.8–5.6)

## 2016-11-25 ENCOUNTER — Encounter: Payer: Self-pay | Admitting: Endocrinology

## 2016-12-08 ENCOUNTER — Ambulatory Visit: Payer: PRIVATE HEALTH INSURANCE

## 2016-12-15 ENCOUNTER — Other Ambulatory Visit: Payer: Self-pay | Admitting: Cardiology

## 2017-01-01 ENCOUNTER — Encounter: Payer: Self-pay | Admitting: Nurse Practitioner

## 2017-01-05 ENCOUNTER — Ambulatory Visit (INDEPENDENT_AMBULATORY_CARE_PROVIDER_SITE_OTHER): Payer: PRIVATE HEALTH INSURANCE | Admitting: Pharmacist

## 2017-01-05 ENCOUNTER — Encounter: Payer: Self-pay | Admitting: Pharmacist

## 2017-01-05 VITALS — BP 180/98 | HR 67

## 2017-01-05 DIAGNOSIS — I10 Essential (primary) hypertension: Secondary | ICD-10-CM

## 2017-01-05 MED ORDER — SPIRONOLACTONE 50 MG PO TABS: 50.0000 mg | ORAL_TABLET | Freq: Every day | ORAL | 1 refills | Status: DC

## 2017-01-05 NOTE — Patient Instructions (Signed)
Return for a  follow up appointment in 1 week for blood work and 2 weeks with Truitt Merle, NP   Check your blood pressure at home daily (if able) and keep record of the readings.  Take your BP meds as follows: INCREASE spironolactone to 50mg  daily (you may take 2 tablets of your current supply then pick up higher strength from pharmacy and take 1 tablet daily )  Bring all of your meds, your BP cuff and your record of home blood pressures to your next appointment.  Exercise as you're able, try to walk approximately 30 minutes per day.  Keep salt intake to a minimum, especially watch canned and prepared boxed foods.  Eat more fresh fruits and vegetables and fewer canned items.  Avoid eating in fast food restaurants.    HOW TO TAKE YOUR BLOOD PRESSURE: . Rest 5 minutes before taking your blood pressure. .  Don't smoke or drink caffeinated beverages for at least 30 minutes before. . Take your blood pressure before (not after) you eat. . Sit comfortably with your back supported and both feet on the floor (don't cross your legs). . Elevate your arm to heart level on a table or a desk. . Use the proper sized cuff. It should fit smoothly and snugly around your bare upper arm. There should be enough room to slip a fingertip under the cuff. The bottom edge of the cuff should be 1 inch above the crease of the elbow. . Ideally, take 3 measurements at one sitting and record the average.

## 2017-01-05 NOTE — Progress Notes (Signed)
Patient ID: [name]                 DOB: 06-Sep-1955                      MRN: [id number]     HPI: [name] is a 61 y.o. male patient of Dr. Martinique who presents today for hypertension follow up.  PMH is significant for HTN, CAD s/p CABG in 2002, PAD, dilated CM, HLD, and medication noncompliance. Remote CTA of the abdomen showed 50% left renal artery stenosis from 2006. At his last visit with HTN clinic he was started on lisinopril 40mg  daily due to cost of ramipril and his hydralazine was increased to 100mg  TID.   He presents today for follow up. Pt denies SOB, dizziness, chest pain, blurred vision. He reports that he has taken all his medications today and has tolerated the increased/changed medications well.   He states he rarely misses his 3rd dose of hydralazine, but denies missed doses of any of his other medications.   Current HTN meds:  amlodipine 10mg  daily in the morning carvedilol 25mg  BID in the morning and at dinner lisinopril 40mg  daily in the morning  clonidine 0.1mg  BID in the morning and at dinner furosemide 40mg  daily in the morning hydralazine 100mg  TID in the morning, at dinner and at bedtime isosorbide 20mg  BID in the morning and at dinner spironolactone 25mg  daily in the morning  BP goal: <130/54mmHg  Family History: The patient's family history includes Diabetes in his mother; Hypertension in his father.  Social History: The patient reports that he has never smoked. He has never used smokeless tobacco. He reports that he does not drink alcohol or use drugs.  Diet: Looks for low sodium hamburger. Likes fish. Cut back on caffeinated soda.  Exercise: Walks every day and stays on his feet all day.  Home BP readings: Does not check at home.  Wt Readings from Last 3 Encounters:  10/19/16 199 lb (90.3 kg)  06/10/16 199 lb (90.3 kg)  02/17/16 203 lb 12.8 oz (92.4 kg)   BP Readings from Last 3 Encounters:  01/05/17 (!) 180/98  11/09/16 (!)  162/88  10/19/16 (!) 180/100   Pulse Readings from Last 3 Encounters:  01/05/17 67  11/09/16 75  10/19/16 74    Renal function: CrCl cannot be calculated (Patient's most recent lab result is older than the maximum 21 days allowed.).  Past Medical History:  Diagnosis Date  . CAD (coronary artery disease)    CABG 2002  . Chronic renal insufficiency   . Dilated cardiomyopathy ([place])    echo in 2009 showed improvement with a normal EF  . HTN (hypertension)   . Hyperlipemia   . Noncompliance   . PAD (peripheral artery disease) ([place])     Current Outpatient Prescriptions on File Prior to Visit  Medication Sig Dispense Refill  . amLODipine (NORVASC) 10 MG tablet Take 10 mg by mouth daily.      [name] aspirin 81 MG tablet Take 81 mg by mouth daily.      [name] atorvastatin (LIPITOR) 80 MG tablet Take 1 tablet (80 mg total) by mouth daily. 90 tablet 3  . carvedilol (COREG) 25 MG tablet TAKE 1 TABLE BY MOUTH 2 TIMES DAILY WITH A MEAL. 30 tablet 5  . cloNIDine (CATAPRES) 0.1 MG tablet Take 1 tablet (0.1 mg total) by mouth 2 (two) times daily. 60 tablet 11  . furosemide (LASIX) 40 MG tablet  Take 1 tablet (40 mg total) by mouth daily. 30 tablet 6  . hydrALAZINE (APRESOLINE) 100 MG tablet Take 1 tablet (100 mg total) by mouth 3 (three) times daily. 270 tablet 3  . isosorbide dinitrate (ISORDIL) 20 MG tablet TAKE 1 TABLET (20 MG TOTAL) BY MOUTH 2 (TWO) TIMES DAILY. 60 tablet 5  . lisinopril (PRINIVIL,ZESTRIL) 40 MG tablet Take 1 tablet (40 mg total) by mouth daily. 90 tablet 3  . potassium chloride SA (K-DUR,KLOR-CON) 20 MEQ tablet Take 1 tablet (20 mEq total) by mouth daily. 60 tablet 6   No current facility-administered medications on file prior to visit.     No Known Allergies  Blood pressure (!) 180/98, pulse 67, SpO2 99 %.   Assessment/Plan: Hypertension: BP is elevated and actually increased from previous visit. Pt believes this is secondary to rushing around at work (but this is usual for  him). He denies missed doses in the last few days. Will increase spironolactone to 50mg  daily and repeat BMET in 1 week. He will follow up with Truitt Merle, NP in 2 weeks for additional medication changes if needed. He can follow up in HTN clinic after that if needed.    Thank you, Lelan Pons. Patterson Hammersmith, [facility]  01/05/2017 4:44 PM

## 2017-01-14 ENCOUNTER — Other Ambulatory Visit: Payer: PRIVATE HEALTH INSURANCE

## 2017-01-14 DIAGNOSIS — I10 Essential (primary) hypertension: Secondary | ICD-10-CM

## 2017-01-15 LAB — BASIC METABOLIC PANEL
BUN / CREAT RATIO: 13 (ref 10–24)
BUN: 19 mg/dL (ref 8–27)
CO2: 22 mmol/L (ref 20–29)
Calcium: 9.3 mg/dL (ref 8.6–10.2)
Chloride: 105 mmol/L (ref 96–106)
Creatinine, Ser: 1.43 mg/dL — ABNORMAL HIGH (ref 0.76–1.27)
GFR, EST AFRICAN AMERICAN: 61 mL/min/{1.73_m2} (ref >59)
GFR, EST NON AFRICAN AMERICAN: 52 mL/min/{1.73_m2} — AB (ref >59)
Glucose: 134 mg/dL — ABNORMAL HIGH (ref 65–99)
POTASSIUM: 4.3 mmol/L (ref 3.5–5.2)
SODIUM: 139 mmol/L (ref 134–144)

## 2017-01-19 ENCOUNTER — Ambulatory Visit: Payer: PRIVATE HEALTH INSURANCE | Admitting: Nurse Practitioner

## 2017-01-25 ENCOUNTER — Other Ambulatory Visit: Payer: Self-pay | Admitting: *Deleted

## 2017-01-26 MED ORDER — ISOSORBIDE DINITRATE 20 MG PO TABS: 20.0000 mg | ORAL_TABLET | Freq: Two times a day (BID) | ORAL | 2 refills | Status: DC

## 2017-02-03 ENCOUNTER — Other Ambulatory Visit: Payer: Self-pay

## 2017-02-03 DIAGNOSIS — I10 Essential (primary) hypertension: Secondary | ICD-10-CM

## 2017-02-05 ENCOUNTER — Other Ambulatory Visit: Payer: PRIVATE HEALTH INSURANCE

## 2017-02-15 ENCOUNTER — Ambulatory Visit: Payer: PRIVATE HEALTH INSURANCE | Admitting: Nurse Practitioner

## 2017-02-15 NOTE — Progress Notes (Deleted)
CARDIOLOGY OFFICE NOTE  Date:  02/15/2017    [name] Date of Birth: 1955/12/15 Medical Record #[id number]  PCP:  Patient, No Pcp Per  Cardiologist:  Servando Snare & Martinique    No chief complaint on file.   History of Present Illness: [name] is a 61 y.o. male who presents today for a follow up visit. Seen for Dr. Martinique.   He has known CAD with remote CABG in 2002 per [facility], HTN, dilated CM, HLD and noncompliancewith his follow up in the past. Echo from 2009 showed dramatic improvement in his LV function and was normal. Last cath in 2006 - not a candidate for further revascularization. Remote CTA of the abdomen showed 50% left renal artery stenosis from 2006.   Seen back several times over the past few years - compliance with his medicines has always been an issue. Finally got him to go for renal duplex. This turned out to be ok. It has been challenging to get him to take his medicines consistently - he does better with afternoon appointments.   Last seen by me back in April - Bp very high - he said he was taking his medicines - ended up sending him to HTN clinic here - seen twice.   Comes back today. Here alone.   Past Medical History:  Diagnosis Date  . CAD (coronary artery disease)    CABG 2002  . Chronic renal insufficiency   . Dilated cardiomyopathy ([place])    echo in 2009 showed improvement with a normal EF  . HTN (hypertension)   . Hyperlipemia   . Noncompliance   . PAD (peripheral artery disease) ([place])     Past Surgical History:  Procedure Laterality Date  . CARDIAC CATHETERIZATION  2002, 2006  . CORONARY ARTERY BYPASS GRAFT  2002   x4 DR. VAN TRIGT     Medications: No outpatient prescriptions have been marked as taking for the 02/15/17 encounter (Appointment) with Burtis Junes, NP.     Allergies: No Known Allergies  Social History: The patient  reports that he has never smoked. He has never used smokeless tobacco. He reports that he  does not drink alcohol or use drugs.   Family History: The patient's family history includes Diabetes in his mother; Hypertension in his father.   Review of Systems: Please see the history of present illness.   Otherwise, the review of systems is positive for none.   All other systems are reviewed and negative.   Physical Exam: VS:  There were no vitals taken for this visit. [name]  BMI There is no height or weight on file to calculate BMI.  Wt Readings from Last 3 Encounters:  10/19/16 199 lb (90.3 kg)  06/10/16 199 lb (90.3 kg)  02/17/16 203 lb 12.8 oz (92.4 kg)    General: Pleasant. Well developed, well nourished and in no acute distress.   HEENT: Normal.  Neck: Supple, no JVD, carotid bruits, or masses noted.  Cardiac: Regular rate and rhythm. No murmurs, rubs, or gallops. No edema.  Respiratory:  Lungs are clear to auscultation bilaterally with normal work of breathing.  GI: Soft and nontender.  MS: No deformity or atrophy. Gait and ROM intact.  Skin: Warm and dry. Color is normal.  Neuro:  Strength and sensation are intact and no gross focal deficits noted.  Psych: Alert, appropriate and with normal affect.   LABORATORY DATA:  EKG:  EKG is not ordered today.  Lab Results  Component  Value Date   WBC 6.2 06/10/2016   HGB 12.1 (L) 06/10/2016   HCT 38.0 (L) 06/10/2016   PLT 218 06/10/2016   GLUCOSE 134 (H) 01/14/2017   CHOL 169 10/19/2016   TRIG 119 10/19/2016   HDL 37 (L) 10/19/2016   LDLDIRECT 149.4 01/07/2011   LDLCALC 108 (H) 10/19/2016   ALT 11 10/19/2016   AST 12 10/19/2016   NA 139 01/14/2017   K 4.3 01/14/2017   CL 105 01/14/2017   CREATININE 1.43 (H) 01/14/2017   BUN 19 01/14/2017   CO2 22 01/14/2017   TSH 0.55 02/11/2016   HGBA1C 8.8 (H) 11/09/2016     BNP (last 3 results) No results for input(s): BNP in the last 8760 hours.  ProBNP (last 3 results) No results for input(s): PROBNP in the last 8760 hours.   Other Studies Reviewed  Today:  CARDIAC CATH FINAL INTERPRETATION FROM 2006:  1. Severe three-vessel obstructive atherosclerotic coronary disease.  2. Patent saphenous vein graft to the first large obtuse marginal vessel.  3. Patent saphenous vein graft to the posterior descending artery with very  small caliber graft throughout.  4. Occluded radial graft to the first marginal vessel.  5. Atretic left internal mammary artery graft to the diagonal.  6. Moderate to severe left ventricular dysfunction.  PLAN: The PDA of the right coronary has dual blood supply both from the  small caliber vein graft and the native vessel. This area does not appear to  be in jeopardy. The proximal and mid circumflex distribution are well  supplied by the native vessel and the distal circumflex including the fourth  and third marginal vessels are supplied by the functioning vein graft. The  LAD is chronic and totally occluded after the diagonal. Therefore the only  territory at risk at this point is the diagonal. There is a high-grade  ostial LAD stenosis obtunding this vessel. This lesion, I think, high risk  for catheter-based intervention given its ostial location and the potential  for compromise of the circumflex.I would recommend continued aggressive  medical therapy at this point, especially since the patient does not have  any active angina.  ______________________________  Peter M. Martinique, M.D.  PMJ/MEDQ D: 04/21/2005 T: 04/21/2005 Job: [id number]   CTA Abd/pelvis Impression from May 2006: 1. 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 Multiple collaterals from the hypogastric circulation  reconstitute the common femoral arteries. 2. Bilateral diffuse SFA disease with enlarged profunda femoris  collaterals. 3. Good three-vessel runoff bilaterally. 4. 15 mm low attenuation lesion from the lower pole left kidney, which is  not clearly a simple cyst by Hounsfield  measurements. Recommend ultrasound to  help exclude small neoplasm.  5. Bilateral nephrolithiasis. 6. Mild plaque in the proximal SMA and proximal left renal artery.  Assessment / Plan: 1. HTN - prior renal duplex was normal - BP   2. CAD - prior CABG - last cath in 2006 - managed medically - no symptoms.   3. Noncompliance - long standing problem.   4. PVD with known RAS - negative renal duplex  5. HLD - remainson statin therapy. Lab today.  6. Mildly anemic   7. Obesity   Current medicines are reviewed with the patient today.  The patient does not have concerns regarding medicines other than what has been noted above.  The following changes have been made:  See above.  Labs/ tests ordered today include:   No orders of the defined types were  placed in this encounter.    Disposition:   FU with *** in {gen number [id number]} {Days to years:10300}.   Patient is agreeable to this plan and will call if any problems develop in the interim.   SignedTruitt Merle, NP  02/15/2017 7:16 AM  [facility] [address] [place], [place]  [address] Phone: [phone] Fax: [phone]

## 2017-02-17 ENCOUNTER — Encounter: Payer: Self-pay | Admitting: Nurse Practitioner

## 2017-03-30 ENCOUNTER — Other Ambulatory Visit: Payer: Self-pay | Admitting: Nurse Practitioner

## 2017-04-20 ENCOUNTER — Other Ambulatory Visit: Payer: Self-pay | Admitting: Cardiology

## 2017-07-26 ENCOUNTER — Other Ambulatory Visit: Payer: Self-pay | Admitting: Nurse Practitioner

## 2017-08-30 ENCOUNTER — Other Ambulatory Visit: Payer: Self-pay | Admitting: Nurse Practitioner

## 2017-11-24 ENCOUNTER — Inpatient Hospital Stay (HOSPITAL_COMMUNITY)
Admission: AD | Admit: 2017-11-24 | Discharge: 2017-11-27 | DRG: 291 | Disposition: A | Discharge status: Home / Self Care | Payer: PRIVATE HEALTH INSURANCE | Source: Ambulatory Visit | Attending: Cardiology | Admitting: Cardiology

## 2017-11-24 ENCOUNTER — Ambulatory Visit (INDEPENDENT_AMBULATORY_CARE_PROVIDER_SITE_OTHER): Payer: PRIVATE HEALTH INSURANCE | Admitting: Nurse Practitioner

## 2017-11-24 ENCOUNTER — Other Ambulatory Visit: Payer: Self-pay | Admitting: Nurse Practitioner

## 2017-11-24 ENCOUNTER — Encounter: Payer: Self-pay | Admitting: Nurse Practitioner

## 2017-11-24 ENCOUNTER — Encounter (HOSPITAL_COMMUNITY): Payer: Self-pay | Admitting: General Practice

## 2017-11-24 ENCOUNTER — Other Ambulatory Visit: Payer: Self-pay

## 2017-11-24 ENCOUNTER — Telehealth: Payer: Self-pay | Admitting: Nurse Practitioner

## 2017-11-24 VITALS — BP 220/120 | HR 83 | Ht 69.5 in | Wt 212.4 lb

## 2017-11-24 DIAGNOSIS — Z79899 Other long term (current) drug therapy: Secondary | ICD-10-CM

## 2017-11-24 DIAGNOSIS — I10 Essential (primary) hypertension: Secondary | ICD-10-CM | POA: Diagnosis not present

## 2017-11-24 DIAGNOSIS — Z951 Presence of aortocoronary bypass graft: Secondary | ICD-10-CM

## 2017-11-24 DIAGNOSIS — Z9119 Patient's noncompliance with other medical treatment and regimen: Secondary | ICD-10-CM

## 2017-11-24 DIAGNOSIS — I5023 Acute on chronic systolic (congestive) heart failure: Secondary | ICD-10-CM | POA: Diagnosis not present

## 2017-11-24 DIAGNOSIS — I2729 Other secondary pulmonary hypertension: Secondary | ICD-10-CM | POA: Diagnosis present

## 2017-11-24 DIAGNOSIS — I701 Atherosclerosis of renal artery: Secondary | ICD-10-CM | POA: Diagnosis present

## 2017-11-24 DIAGNOSIS — E785 Hyperlipidemia, unspecified: Secondary | ICD-10-CM | POA: Diagnosis present

## 2017-11-24 DIAGNOSIS — Z7982 Long term (current) use of aspirin: Secondary | ICD-10-CM

## 2017-11-24 DIAGNOSIS — N183 Chronic kidney disease, stage 3 (moderate): Secondary | ICD-10-CM | POA: Diagnosis present

## 2017-11-24 DIAGNOSIS — I130 Hypertensive heart and chronic kidney disease with heart failure and stage 1 through stage 4 chronic kidney disease, or unspecified chronic kidney disease: Principal | ICD-10-CM | POA: Diagnosis present

## 2017-11-24 DIAGNOSIS — I5021 Acute systolic (congestive) heart failure: Secondary | ICD-10-CM

## 2017-11-24 DIAGNOSIS — Z8249 Family history of ischemic heart disease and other diseases of the circulatory system: Secondary | ICD-10-CM

## 2017-11-24 DIAGNOSIS — I472 Ventricular tachycardia: Secondary | ICD-10-CM | POA: Diagnosis present

## 2017-11-24 DIAGNOSIS — Z91199 Patient's noncompliance with other medical treatment and regimen due to unspecified reason: Secondary | ICD-10-CM

## 2017-11-24 DIAGNOSIS — I2510 Atherosclerotic heart disease of native coronary artery without angina pectoris: Secondary | ICD-10-CM | POA: Diagnosis present

## 2017-11-24 DIAGNOSIS — I259 Chronic ischemic heart disease, unspecified: Secondary | ICD-10-CM | POA: Diagnosis not present

## 2017-11-24 DIAGNOSIS — Z833 Family history of diabetes mellitus: Secondary | ICD-10-CM

## 2017-11-24 DIAGNOSIS — Z9114 Patient's other noncompliance with medication regimen: Secondary | ICD-10-CM | POA: Diagnosis not present

## 2017-11-24 DIAGNOSIS — I420 Dilated cardiomyopathy: Secondary | ICD-10-CM | POA: Diagnosis present

## 2017-11-24 DIAGNOSIS — I161 Hypertensive emergency: Secondary | ICD-10-CM | POA: Diagnosis present

## 2017-11-24 DIAGNOSIS — I739 Peripheral vascular disease, unspecified: Secondary | ICD-10-CM | POA: Diagnosis present

## 2017-11-24 DIAGNOSIS — I5043 Acute on chronic combined systolic (congestive) and diastolic (congestive) heart failure: Secondary | ICD-10-CM | POA: Diagnosis present

## 2017-11-24 HISTORY — DX: Unspecified osteoarthritis, unspecified site: M19.90

## 2017-11-24 HISTORY — DX: Heart failure, unspecified: I50.9

## 2017-11-24 LAB — COMPREHENSIVE METABOLIC PANEL
ALT: 13 U/L — ABNORMAL LOW (ref 17–63)
AST: 20 U/L (ref 15–41)
Albumin: 3.1 g/dL — ABNORMAL LOW (ref 3.5–5.0)
Alkaline Phosphatase: 140 U/L — ABNORMAL HIGH (ref 38–126)
Anion gap: 10 (ref 5–15)
BUN: 18 mg/dL (ref 6–20)
CO2: 21 mmol/L — ABNORMAL LOW (ref 22–32)
Calcium: 9.1 mg/dL (ref 8.9–10.3)
Chloride: 109 mmol/L (ref 101–111)
Creatinine, Ser: 1.59 mg/dL — ABNORMAL HIGH (ref 0.61–1.24)
GFR calc Af Amer: 52 mL/min — ABNORMAL LOW (ref >60)
GFR calc non Af Amer: 45 mL/min — ABNORMAL LOW (ref >60)
Glucose, Bld: 96 mg/dL (ref 65–99)
Potassium: 3.5 mmol/L (ref 3.5–5.1)
Sodium: 140 mmol/L (ref 135–145)
Total Bilirubin: 3.4 mg/dL — ABNORMAL HIGH (ref 0.3–1.2)
Total Protein: 9.6 g/dL — ABNORMAL HIGH (ref 6.5–8.1)

## 2017-11-24 LAB — TROPONIN I: Troponin I: <0.03 ng/mL (ref <0.03)

## 2017-11-24 LAB — CBC WITH DIFFERENTIAL/PLATELET
Abs Immature Granulocytes: 0 10*3/uL (ref 0.0–0.1)
Basophils Absolute: 0 10*3/uL (ref 0.0–0.1)
Basophils Relative: 1 %
Eosinophils Absolute: 0.2 10*3/uL (ref 0.0–0.7)
Eosinophils Relative: 3 %
HCT: 39.4 % (ref 39.0–52.0)
Hemoglobin: 12.9 g/dL — ABNORMAL LOW (ref 13.0–17.0)
Immature Granulocytes: 0 %
Lymphocytes Relative: 12 %
Lymphs Abs: 0.7 10*3/uL (ref 0.7–4.0)
MCH: 27 pg (ref 26.0–34.0)
MCHC: 32.7 g/dL (ref 30.0–36.0)
MCV: 82.6 fL (ref 78.0–100.0)
Monocytes Absolute: 0.7 10*3/uL (ref 0.1–1.0)
Monocytes Relative: 12 %
Neutro Abs: 4.1 10*3/uL (ref 1.7–7.7)
Neutrophils Relative %: 72 %
Platelets: 168 10*3/uL (ref 150–400)
RBC: 4.77 MIL/uL (ref 4.22–5.81)
RDW: 20.4 % — ABNORMAL HIGH (ref 11.5–15.5)
WBC: 5.6 10*3/uL (ref 4.0–10.5)

## 2017-11-24 LAB — PROTIME-INR
INR: 1.37
Prothrombin Time: 16.7 seconds — ABNORMAL HIGH (ref 11.4–15.2)

## 2017-11-24 LAB — TSH: TSH: 0.51 u[IU]/mL (ref 0.350–4.500)

## 2017-11-24 LAB — MAGNESIUM: Magnesium: 1.4 mg/dL — ABNORMAL LOW (ref 1.7–2.4)

## 2017-11-24 LAB — APTT: aPTT: 38 seconds — ABNORMAL HIGH (ref 24–36)

## 2017-11-24 LAB — BRAIN NATRIURETIC PEPTIDE: B Natriuretic Peptide: 914.8 pg/mL — ABNORMAL HIGH (ref 0.0–100.0)

## 2017-11-24 MED ORDER — ISOSORBIDE MONONITRATE ER 30 MG PO TB24
30.0000 mg | ORAL_TABLET | Freq: Every day | ORAL | Status: DC
  Administered 2017-11-24 – 2017-11-27 (×4): 30 mg via ORAL
  Filled 2017-11-24 (×5): qty 1

## 2017-11-24 MED ORDER — HEPARIN SODIUM (PORCINE) 5000 UNIT/ML IJ SOLN
5000.0000 [IU] | Freq: Three times a day (TID) | INTRAMUSCULAR | Status: DC
  Administered 2017-11-24 – 2017-11-27 (×8): 5000 [IU] via SUBCUTANEOUS
  Filled 2017-11-24 (×8): qty 1

## 2017-11-24 MED ORDER — FUROSEMIDE 10 MG/ML IJ SOLN
80.0000 mg | Freq: Two times a day (BID) | INTRAMUSCULAR | Status: DC
  Administered 2017-11-24 – 2017-11-27 (×6): 80 mg via INTRAVENOUS
  Filled 2017-11-24 (×6): qty 8

## 2017-11-24 MED ORDER — CLONIDINE HCL 0.1 MG PO TABS: 0.1000 mg | ORAL_TABLET | Freq: Once | ORAL | Status: DC

## 2017-11-24 MED ORDER — CARVEDILOL 3.125 MG PO TABS
3.1250 mg | ORAL_TABLET | Freq: Two times a day (BID) | ORAL | Status: DC
  Administered 2017-11-25: 3.125 mg via ORAL
  Filled 2017-11-24 (×2): qty 1

## 2017-11-24 MED ORDER — POTASSIUM CHLORIDE CRYS ER 20 MEQ PO TBCR
20.0000 meq | EXTENDED_RELEASE_TABLET | Freq: Two times a day (BID) | ORAL | Status: DC
  Administered 2017-11-24 – 2017-11-27 (×6): 20 meq via ORAL
  Filled 2017-11-24 (×6): qty 1

## 2017-11-24 MED ORDER — ASPIRIN EC 81 MG PO TBEC
81.0000 mg | DELAYED_RELEASE_TABLET | Freq: Every day | ORAL | Status: DC
  Administered 2017-11-24 – 2017-11-27 (×4): 81 mg via ORAL
  Filled 2017-11-24 (×4): qty 1

## 2017-11-24 MED ORDER — ONDANSETRON HCL 4 MG/2ML IJ SOLN: 4.0000 mg | Freq: Four times a day (QID) | INTRAMUSCULAR | Status: DC | PRN

## 2017-11-24 MED ORDER — ACETAMINOPHEN 325 MG PO TABS: 650.0000 mg | ORAL_TABLET | ORAL | Status: DC | PRN

## 2017-11-24 MED ORDER — HYDRALAZINE HCL 25 MG PO TABS
25.0000 mg | ORAL_TABLET | Freq: Three times a day (TID) | ORAL | Status: DC
  Administered 2017-11-24 – 2017-11-27 (×8): 25 mg via ORAL
  Filled 2017-11-24 (×8): qty 1

## 2017-11-24 MED ORDER — HYDRALAZINE HCL 20 MG/ML IJ SOLN
20.0000 mg | Freq: Once | INTRAMUSCULAR | Status: AC
  Administered 2017-11-24: 20 mg via INTRAVENOUS
  Filled 2017-11-24: qty 1

## 2017-11-24 NOTE — H&P (Addendum)
The patient has been seen in conjunction with Truitt Merle, [facility]. All aspects of care have been considered and discussed. The patient has been personally interviewed, examined, and all clinical data has been reviewed.   Severe blood pressure elevation, volume overload, shortness of breath, in association with medication discontinuation.  Progressive symptoms of swelling and dyspnea over the past 2 weeks.  Plan admission to the hospital for reinstitution of therapy and relatively expedient blood pressure control along with diuresis to relieve heart failure symptoms.  Long discussion with patient and family about medication compliance and the need for chronic therapy.  Concerned that there is redevelopment of systolic dysfunction..  Echocardiogram will also evaluate right heart as echo seems to suggest more right than left heart failure.  If significant pulmonary hypertension, consideration of PE would be a consideration.  Serial cardiac markers, anticoagulation, IV diuresis, hydralazine, and long-acting nitrates.  Would not reinstitute beta-blocker therapy until volume status improves.  Office Visit   11/24/2017 [facility]    Harmony, Marlane Hatcher, NP    Cardiology   Ischemic heart disease +2 more    Dx   Edema      Reason for Visit     Additional Documentation   Vitals:   BP 220/120 (BP Location: Left Arm, Patient Position: Sitting, Cuff Size: Normal)     Pulse 83    Ht 5' 9.5" (1.765 m)    Wt 212 lb 6.4 oz (96.3 kg)    BMI 30.92 kg/m    BSA 2.17 m        More Vitals    Flowsheets:   Anthropometrics,    MEWS Score      Encounter Info:   Billing Info,    History,    Allergies,    Detailed Report       All Notes    Progress Notes by Burtis Junes, NP at 11/24/2017 3:00 PM   Author: Burtis Junes, NP Author Type: Nurse Practitioner Filed: 11/24/2017 4:38 PM  Note Status: Signed Cosign: Cosign Not Required Encounter Date:  11/24/2017  Editor: Burtis Junes, NP (Nurse Practitioner)  Expand All Collapse All       CARDIOLOGY OFFICE NOTE  Date:  11/24/2017    [name] Date of Birth: [birth year] Medical Record #[id number] PCP:  Patient, No Pcp Per      Cardiologist:  Gerhardt & Martinique      Chief Complaint  Patient presents with  . Edema    Work in visit - seen for Dr. Martinique    History of Present Illness: [name] is a 62 y.o. male who presents today for a work in visit. Seen for Dr. Martinique. He has primarily seen me over the past several years.   He has known CAD with remote CABG in 2002 per [facility], HTN, dilated CM, HLD and noncompliancewith his follow up in the past. His last Echo was from 2009 showed dramatic improvement in his LV function and was normal. Last cath in 2006 - not a candidate for further revascularization. Remote CTA of the abdomen showed 50% left renal artery stenosis from 2006.   Seen backseveral times over the past few years - compliance with his medicines has always been an issue. Finally got him to gofor renal duplex. This turned out to be ok. It has been challenging to get him to take his medicines consistently - he does better with afternoon appointments.  I last saw him back in April of  2018 - BP pretty high. Michela Pitcher he was taking his medicines. Referred to the HTN clinic. He has not been back here since.    Phone call earlier today - "Called pt's wife back stated went out of town on Friday to see daughter to graduate came back Monday, pt has had SOB, nausea, not eating or drinking, throwing up, last time was Saturday, no fever, has chills, stated pt's body is swollen everywhere. Pt has not weighed. Pt has a sore on leg with black dots around sore, pt stated has been bite by something. Pt's wife stated pt is in denial and went to work. Pt's wife wanted appt next week, Cecille Rubin is booked. Offered appt today at 3, pt's wife will call back after talking  with pt".   Comes in today. Here alone. He has cancelled multiple appointments - has not been seen here in over a year. He says he is fine. Says he feels fine. Says he is not short of breath. He has no chest pain. BP is again sky high. Still with no PCP. He says he is here because of his left knee pain that has gotten worse over the past weeks. Weight is up 13 pounds. He says he is taking his medicines - but cannot tell me what he is taking.   His wife came to the office - I have spoken to her separately - she is quite worried that he is in heart failure. She notes significant decline in his overall health over the past 2 weeks. They traveled to a graduation over the weekend - he could not make it due to being short of breath. Had to be put in a wheelchair to get thru the airport.  He has had more bloating and swelling over the past 2 weeks. He has been nauseated - can't eat but gaining weight. Tells her that he is only taking 4 medicines because "his other medicines were stopped" - which is not true. She did not realize that he had not been here in over a year.  She says he has been short of breath with basically any activity. They actually had to go and buy new shoes because his would not fit due to the swelling. She is not aware of what medicines he is taking - did not know that he has missed appointments, etc.        Past Medical History:  Diagnosis Date  . CAD (coronary artery disease)    CABG 2002  . Chronic renal insufficiency   . Dilated cardiomyopathy ([place])    echo in 2009 showed improvement with a normal EF  . HTN (hypertension)   . Hyperlipemia   . Noncompliance   . PAD (peripheral artery disease) ([place])          Past Surgical History:  Procedure Laterality Date  . CARDIAC CATHETERIZATION  2002, 2006  . CORONARY ARTERY BYPASS GRAFT  2002   x4 DR. VAN TRIGT     Medications: ActiveMedications      Current Meds  Medication Sig  . amLODipine (NORVASC) 10  MG tablet Take 10 mg by mouth daily.    [name] aspirin 81 MG tablet Take 81 mg by mouth daily.    [name] atorvastatin (LIPITOR) 80 MG tablet Take 1 tablet (80 mg total) by mouth daily.  . carvedilol (COREG) 25 MG tablet TAKE 1 TABLE BY MOUTH 2 TIMES DAILY WITH A MEAL.  . cloNIDine (CATAPRES) 0.1 MG tablet TAKE 1 TABLET BY MOUTH TWICE  A DAY  . furosemide (LASIX) 40 MG tablet Take 1 tablet (40 mg total) by mouth daily.  . isosorbide dinitrate (ISORDIL) 20 MG tablet Take 1 tablet (20 mg total) by mouth 2 (two) times daily.  . potassium chloride SA (K-DUR,KLOR-CON) 20 MEQ tablet Take 1 tablet (20 mEq total) by mouth daily.  [name] spironolactone (ALDACTONE) 50 MG tablet Take 1 tablet (50 mg total) by mouth daily.       Allergies: No Known Allergies  Social History: The patient  reports that he has never smoked. He has never used smokeless tobacco. He reports that he does not drink alcohol or use drugs.   Family History: The patient's family history includes Diabetes in his mother; Hypertension in his father.   Review of Systems: Please see the history of present illness.   Otherwise, the review of systems is positive for none.   All other systems are reviewed and negative.   Physical Exam: VS:  BP (!) 220/120 (BP Location: Left Arm, Patient Position: Sitting, Cuff Size: Normal)   Pulse 83   Ht 5' 9.5" (1.765 m)   Wt 212 lb 6.4 oz (96.3 kg)   BMI 30.92 kg/m  .  BMI Body mass index is 30.92 kg/m.     Wt Readings from Last 3 Encounters:  11/24/17 212 lb 6.4 oz (96.3 kg)  10/19/16 199 lb (90.3 kg)  06/10/16 199 lb (90.3 kg)    General: Alert. Weight is up 13 pounds since last seen here.  HEENT: Normal.  Neck: Supple, + JVD noted Cardiac: Regular rate and rhythm. ?S4.  Over 2+ edema - up to the knees.  Respiratory:  Lungs are clear to auscultation bilaterally with normal work of breathing.  GI: Soft and nontender.  MS: No deformity or atrophy. Gait and ROM intact.  Skin: Warm and  dry. Color is normal.  Neuro:  Strength and sensation are intact and no gross focal deficits noted.  Psych: Alert, appropriate and with normal affect.   LABORATORY DATA:  EKG:  EKG is ordered today. This shows NSR with PVC - nonspecific ST and T wave changes - reviewed with Dr. Tamala Julian.   RecentLabs       Lab Results  Component Value Date   WBC 6.2 06/10/2016   HGB 12.1 (L) 06/10/2016   HCT 38.0 (L) 06/10/2016   PLT 218 06/10/2016   GLUCOSE 134 (H) 01/14/2017   CHOL 169 10/19/2016   TRIG 119 10/19/2016   HDL 37 (L) 10/19/2016   LDLDIRECT 149.4 01/07/2011   LDLCALC 108 (H) 10/19/2016   ALT 11 10/19/2016   AST 12 10/19/2016   NA 139 01/14/2017   K 4.3 01/14/2017   CL 105 01/14/2017   CREATININE 1.43 (H) 01/14/2017   BUN 19 01/14/2017   CO2 22 01/14/2017   TSH 0.55 02/11/2016   HGBA1C 8.8 (H) 11/09/2016       BNP (last 3 results) RecentLabs(withinlast365days)  No results for input(s): BNP in the last 8760 hours.    ProBNP (last 3 results) RecentLabs(withinlast365days)  No results for input(s): PROBNP in the last 8760 hours.     Other Studies Reviewed Today:  CARDIAC CATH FINAL INTERPRETATION FROM 2006:  1. Severe three-vessel obstructive atherosclerotic coronary disease.  2. Patent saphenous vein graft to the first large obtuse marginal vessel.  3. Patent saphenous vein graft to the posterior descending artery with very  small caliber graft throughout.  4. Occluded radial graft to the first marginal vessel.  5. Atretic  left internal mammary artery graft to the diagonal.  6. Moderate to severe left ventricular dysfunction.  PLAN: The PDA of the right coronary has dual blood supply both from the  small caliber vein graft and the native vessel. This area does not appear to  be in jeopardy. The proximal and mid circumflex distribution are well  supplied by the native vessel and the distal circumflex including the  fourth  and third marginal vessels are supplied by the functioning vein graft. The  LAD is chronic and totally occluded after the diagonal. Therefore the only  territory at risk at this point is the diagonal. There is a high-grade  ostial LAD stenosis obtunding this vessel. This lesion, I think, high risk  for catheter-based intervention given its ostial location and the potential  for compromise of the circumflex.I would recommend continued aggressive  medical therapy at this point, especially since the patient does not have  any active angina.  ______________________________  Peter M. Martinique, M.D.  PMJ/MEDQ D: 04/21/2005 T: 04/21/2005 Job: [id number]   CTA Abd/pelvis Impression from May 2006: 1. 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 Multiple collaterals from the hypogastric circulation  reconstitute the common femoral arteries. 2. Bilateral diffuse SFA disease with enlarged profunda femoris  collaterals. 3. Good three-vessel runoff bilaterally. 4. 15 mm low attenuation lesion from the lower pole left kidney, which is  not clearly a simple cyst by Hounsfield measurements. Recommend ultrasound to  help exclude small neoplasm.  5. Bilateral nephrolithiasis. 6. Mild plaque in the proximal SMA and proximal left renal artery.  Assessment / Plan:  1. HTN - priorrenal duplex was normal- his BP is dangerously high here today - he was given 0.1 mg of Clonidine here in the office - BP has not improved. We are admitting for further management. See #2. BP down to 190/90 at end of visit.   2. Probable recurrent acute systolic HF - weight is up - while he does not endorse any symptoms - his wife tells a totally different story. He has evidence of volume overload on exam with edema and +JVD. Will plan to admit. Diurese. BP control. Needs echo. Needs medicines most likely restarted. We do not have any idea of what he is actually taking -  basically I think we need to start over. Wife will bring his bottles tomorrow for hospital staff to review. He has been seen along with Dr. Tamala Julian this afternoon in the office.   3. CAD - prior CABG - last cath in 2006 - he may need repeat cath. His report of symptoms is not reliable.   4. Noncompliance - long standing problem. He has real poor insight into his issues - he does not even provide an accurate history - I am not sure why this is - not sure what I can do to change this. His wife seems very willing to help out as needed if Reymundo will let her.   5. PVD with known RAS - negative renal duplex  Current medicines are reviewed with the patient today.  The patient does not have concerns regarding medicines other than what has been noted above.  The following changes have been made:  See above.  Labs/ tests ordered today include:       Orders Placed This Encounter  Procedures  . EKG 12-Lead     Disposition:   Admitting to [facility] for further evaluation.    Patient is agreeable to this plan and will  call if any problems develop in the interim.   SignedTruitt Merle, NP  11/24/2017 3:21 PM  [facility] [address] [place], [place]  [address] Phone: [phone] Fax: [phone]              Patient Instructions by Burtis Junes, NP at 11/24/2017 3:00 PM   Author: Burtis Junes, NP Author Type: Nurse Practitioner Filed: 11/24/2017 4:36 PM  Note Status: Addendum Cosign: Cosign Not Required Encounter Date: 11/24/2017  Editor: Burtis Junes, NP (Nurse Practitioner)  Prior Versions: 1. Burtis Junes, NP (Nurse Practitioner) at 11/24/2017 3:11 PM - Signed    We are admitting you to the hospital today   Call the [place] office at [phone] if you have any questions, problems or concerns.            Instructions   We are admitting you to the hospital  today   Call the [place] office at [phone] if you have any questions, problems or concerns.           Communications      Chart Routed to Belva Crome, MD and Martinique, Peter M, MD     Media   Electronic signature on 11/24/2017 3:04 PM - Signed    Communication Routing History   None    Orders Placed    EKG 12-Lead    Medication Changes      cloNIDine HCl         0.1 MG Tabs, TAKE 1 TABLET BY MOUTH TWICE A DAY      0.1 mg Oral Once       Medication List    Visit Diagnoses      Ischemic heart disease      Essential hypertension      Noncompliance     Problem List    Level of Service   Level of Service  PR OFFICE OUTPATIENT VISIT [place] [[id number]]  LOS History   All Charges for This Encounter   Code  24401  Description: PR OFFICE OUTPATIENT VISIT 40 MINUTES  Service Date: 11/24/2017  Service Provider: Burtis Junes, NP  Qty: 1

## 2017-11-24 NOTE — Progress Notes (Addendum)
CARDIOLOGY OFFICE NOTE  Date:  11/24/2017    [name] Date of Birth: 08-Dec-1955 Medical Record #[id number]  PCP:  Patient, No Pcp Per  Cardiologist:  Servando Snare & Martinique  Chief Complaint  Patient presents with  . Edema    Work in visit - seen for Dr. Martinique    History of Present Illness: [name] is a 62 y.o. male who presents today for a work in visit. Seen for Dr. Martinique. He has primarily seen me over the past several years.   He has known CAD with remote CABG in 2002 per [facility], HTN, dilated CM, HLD and noncompliancewith his follow up in the past. His last Echo was from 2009 showed dramatic improvement in his LV function and was normal. Last cath in 2006 - not a candidate for further revascularization. Remote CTA of the abdomen showed 50% left renal artery stenosis from 2006.   Seen back several times over the past few years - compliance with his medicines has always been an issue. Finally got him to go for renal duplex. This turned out to be ok. It has been challenging to get him to take his medicines consistently - he does better with afternoon appointments.   I last saw him back in April of 2018 - BP pretty high. Michela Pitcher he was taking his medicines. Referred to the HTN clinic. He has not been back here since.    Phone call earlier today - "Called pt's wife back stated went out of town on Friday to see daughter to graduate came back Monday, pt has had SOB, nausea, not eating or drinking, throwing up, last time was Saturday, no fever, has chills, stated pt's body is swollen everywhere. Pt has not weighed.  Pt has a sore on leg with black dots around sore, pt stated has been bite by something.  Pt's wife stated pt is in denial and went to work. Pt's wife wanted appt next week, Cecille Rubin is booked.  Offered appt today at 3, pt's wife will call back after talking with pt".    Comes in today. Here alone. He has cancelled multiple appointments - has not been seen here in over  a year. He says he is fine. Says he feels fine. Says he is not short of breath. He has no chest pain. BP is again sky high. Still with no PCP. He says he is here because of his left knee pain that has gotten worse over the past weeks. Weight is up 13 pounds. He says he is taking his medicines - but cannot tell me what he is taking.   His wife came to the office - I have spoken to her separately - she is quite worried that he is in heart failure. She notes significant decline in his overall health over the past 2 weeks. They traveled to a graduation to [place] over the weekend - he could not make it due to being short of breath. Had to be put in a wheelchair to get thru the airport.  He has had more bloating and swelling over the past 2 weeks. He has been nauseated - can't eat but gaining weight. Tells her that he is only taking 4 medicines because "his other medicines were stopped" - which is not true. She did not realize that he had not been here in over a year.  She says he has been short of breath with basically any activity. They actually had to go and buy new  shoes because his would not fit due to the swelling. She is not aware of what medicines he is taking - did not know that he has missed appointments, etc.    Past Medical History:  Diagnosis Date  . CAD (coronary artery disease)    CABG 2002  . Chronic renal insufficiency   . Dilated cardiomyopathy ([place])    echo in 2009 showed improvement with a normal EF  . HTN (hypertension)   . Hyperlipemia   . Noncompliance   . PAD (peripheral artery disease) ([place])     Past Surgical History:  Procedure Laterality Date  . CARDIAC CATHETERIZATION  2002, 2006  . CORONARY ARTERY BYPASS GRAFT  2002   x4 DR. VAN TRIGT     Medications: Current Meds  Medication Sig  . amLODipine (NORVASC) 10 MG tablet Take 10 mg by mouth daily.    [name] aspirin 81 MG tablet Take 81 mg by mouth daily.    [name] atorvastatin (LIPITOR) 80 MG tablet Take 1 tablet (80 mg total)  by mouth daily.  . carvedilol (COREG) 25 MG tablet TAKE 1 TABLE BY MOUTH 2 TIMES DAILY WITH A MEAL.  . cloNIDine (CATAPRES) 0.1 MG tablet TAKE 1 TABLET BY MOUTH TWICE A DAY  . furosemide (LASIX) 40 MG tablet Take 1 tablet (40 mg total) by mouth daily.  . isosorbide dinitrate (ISORDIL) 20 MG tablet Take 1 tablet (20 mg total) by mouth 2 (two) times daily.  . potassium chloride SA (K-DUR,KLOR-CON) 20 MEQ tablet Take 1 tablet (20 mEq total) by mouth daily.  [name] spironolactone (ALDACTONE) 50 MG tablet Take 1 tablet (50 mg total) by mouth daily.     Allergies: No Known Allergies  Social History: The patient  reports that he has never smoked. He has never used smokeless tobacco. He reports that he does not drink alcohol or use drugs.   Family History: The patient's family history includes Diabetes in his mother; Hypertension in his father.   Review of Systems: Please see the history of present illness.   Otherwise, the review of systems is positive for none.   All other systems are reviewed and negative.   Physical Exam: VS:  BP (!) 220/120 (BP Location: Left Arm, Patient Position: Sitting, Cuff Size: Normal)   Pulse 83   Ht 5' 9.5" (1.765 m)   Wt 212 lb 6.4 oz (96.3 kg)   BMI 30.92 kg/m  .  BMI Body mass index is 30.92 kg/m.  Wt Readings from Last 3 Encounters:  11/24/17 212 lb 6.4 oz (96.3 kg)  10/19/16 199 lb (90.3 kg)  06/10/16 199 lb (90.3 kg)    General: Alert. Weight is up 13 pounds since last seen here.  HEENT: Normal.  Neck: Supple, + JVD noted Cardiac: Regular rate and rhythm. ?S4.  Over 2+ edema - up to the knees.  Respiratory:  Lungs are clear to auscultation bilaterally with normal work of breathing.  GI: Soft and nontender.  MS: No deformity or atrophy. Gait and ROM intact.  Skin: Warm and dry. Color is normal.  Neuro:  Strength and sensation are intact and no gross focal deficits noted.  Psych: Alert, appropriate and with normal affect.   LABORATORY  DATA:  EKG:  EKG is ordered today. This shows NSR with PVC - nonspecific ST and T wave changes - reviewed with Dr. Tamala Julian.   Lab Results  Component Value Date   WBC 6.2 06/10/2016   HGB 12.1 (L) 06/10/2016   HCT 38.0 (  L) 06/10/2016   PLT 218 06/10/2016   GLUCOSE 134 (H) 01/14/2017   CHOL 169 10/19/2016   TRIG 119 10/19/2016   HDL 37 (L) 10/19/2016   LDLDIRECT 149.4 01/07/2011   LDLCALC 108 (H) 10/19/2016   ALT 11 10/19/2016   AST 12 10/19/2016   NA 139 01/14/2017   K 4.3 01/14/2017   CL 105 01/14/2017   CREATININE 1.43 (H) 01/14/2017   BUN 19 01/14/2017   CO2 22 01/14/2017   TSH 0.55 02/11/2016   HGBA1C 8.8 (H) 11/09/2016     BNP (last 3 results) No results for input(s): BNP in the last 8760 hours.  ProBNP (last 3 results) No results for input(s): PROBNP in the last 8760 hours.   Other Studies Reviewed Today:  CARDIAC CATH FINAL INTERPRETATION FROM 2006:  1. Severe three-vessel obstructive atherosclerotic coronary disease.  2. Patent saphenous vein graft to the first large obtuse marginal vessel.  3. Patent saphenous vein graft to the posterior descending artery with very  small caliber graft throughout.  4. Occluded radial graft to the first marginal vessel.  5. Atretic left internal mammary artery graft to the diagonal.  6. Moderate to severe left ventricular dysfunction.  PLAN: The PDA of the right coronary has dual blood supply both from the  small caliber vein graft and the native vessel. This area does not appear to  be in jeopardy. The proximal and mid circumflex distribution are well  supplied by the native vessel and the distal circumflex including the fourth  and third marginal vessels are supplied by the functioning vein graft. The  LAD is chronic and totally occluded after the diagonal. Therefore the only  territory at risk at this point is the diagonal. There is a high-grade  ostial LAD stenosis obtunding this vessel. This lesion, I  think, high risk  for catheter-based intervention given its ostial location and the potential  for compromise of the circumflex.I would recommend continued aggressive  medical therapy at this point, especially since the patient does not have  any active angina.  ______________________________  Peter M. Martinique, M.D.  PMJ/MEDQ D: 04/21/2005 T: 04/21/2005 Job: [id number]   CTA Abd/pelvis Impression from May 2006: 1. 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 Multiple collaterals from the hypogastric circulation  reconstitute the common femoral arteries. 2. Bilateral diffuse SFA disease with enlarged profunda femoris  collaterals. 3. Good three-vessel runoff bilaterally. 4. 15 mm low attenuation lesion from the lower pole left kidney, which is  not clearly a simple cyst by Hounsfield measurements. Recommend ultrasound to  help exclude small neoplasm.  5. Bilateral nephrolithiasis. 6. Mild plaque in the proximal SMA and proximal left renal artery.  Assessment / Plan:  1. HTN - prior renal duplex was normal - his BP is dangerously high here today - he was given 0.1 mg of Clonidine here in the office - BP has not improved. We are admitting for further management. See #2.   We will plan to use IV lasix, hydralazine 25 mg TID and Imdur 30 mg. Start Coreg back at 3.125 mg BID starting tomorrow after IV diuresis has been initiated. We will hold all of his other medicines that are on his list (Norvasc 10 mg, Prinivil 40 mg and Aldactone 50 mg until we see what his labs show.   2. Probable recurrent acute systolic HF - weight is up - while he does not endorse any symptoms - his wife tells a totally different story. He  has evidence of volume overload on exam with edema and +JVD. Will plan to admit. Diurese. BP control. Needs echo. Needs medicines most likely restarted. We do not have any idea of what he is actually taking - basically I think we need to  start over. Wife will bring his bottles tomorrow for hospital staff to review. He has been seen along with Dr. Tamala Julian this afternoon in the office.   3. CAD - prior CABG - last cath in 2006 - he may need repeat cath. His report of symptoms is not reliable.   4. Noncompliance - long standing problem. He has real poor insight into his issues - he does not even provide an accurate history - I am not sure why this is - not sure what I can do to change this. His wife seems very willing to help out as needed if [name] will let her.   5. PVD with known RAS - negative renal duplex  Current medicines are reviewed with the patient today.  The patient does not have concerns regarding medicines other than what has been noted above.  The following changes have been made:  See above.  Labs/ tests ordered today include:    Orders Placed This Encounter  Procedures  . EKG 12-Lead     Disposition:   Admitting to [facility] for further evaluation. BP 190/90. He was [facility] by his wife in private vehicle.    Patient is agreeable to this plan and will call if any problems develop in the interim.   SignedTruitt Merle, NP  11/24/2017 3:21 PM  [place] [address] [place], [place]  [address] Phone: [phone] Fax: [phone]

## 2017-11-24 NOTE — Telephone Encounter (Signed)
Called pt's wife back stated went out of town on Friday to see daughter to graduate came back Monday, pt has had SOB, nausea, not eating or drinking, throwing up, last time was Saturday, no fever, has chills, stated pt's body is swollen everywhere. Pt has not weighed.  Pt has a sore on leg with black dots around sore, pt stated has been bite by something.  Pt's wife stated pt is in denial and went to work. Pt's wife wanted appt next week, Cecille Rubin is booked.  Offered appt today at 3, pt's wife will call back after talking with pt.

## 2017-11-24 NOTE — Telephone Encounter (Signed)
New Message:      Pt c/o swelling: STAT is pt has developed SOB within 24 hours  1) How much weight have you gained and in what time span? Not sure  2) If swelling, where is the swelling located? Legs and arms  3) Are you currently taking a fluid pill? no  4) Are you currently SOB?  eyes  5) Do you have a log of your daily weights (if so, list)? no  6) Have you gained 3 pounds in a day or 5 pounds in a week? Not sure  7) Have you traveled recently? Yes  Pt's wife feels like pt is gone back into congestive heart failure. She states he is swollen all over. Pt has not weighed to see if he has gained any weight.

## 2017-11-24 NOTE — Patient Instructions (Addendum)
We are admitting you to the hospital today.   Call the [facility] office at [phone] if you have any questions, problems or concerns.      

## 2017-11-24 NOTE — Telephone Encounter (Signed)
S/w pt's wife pt will come in today at 3:00 pm.

## 2017-11-25 ENCOUNTER — Observation Stay (HOSPITAL_BASED_OUTPATIENT_CLINIC_OR_DEPARTMENT_OTHER): Payer: PRIVATE HEALTH INSURANCE

## 2017-11-25 ENCOUNTER — Observation Stay (HOSPITAL_COMMUNITY): Payer: PRIVATE HEALTH INSURANCE

## 2017-11-25 DIAGNOSIS — I5031 Acute diastolic (congestive) heart failure: Secondary | ICD-10-CM | POA: Diagnosis not present

## 2017-11-25 DIAGNOSIS — I5021 Acute systolic (congestive) heart failure: Secondary | ICD-10-CM | POA: Diagnosis not present

## 2017-11-25 DIAGNOSIS — I739 Peripheral vascular disease, unspecified: Secondary | ICD-10-CM

## 2017-11-25 DIAGNOSIS — I160 Hypertensive urgency: Secondary | ICD-10-CM

## 2017-11-25 DIAGNOSIS — I2510 Atherosclerotic heart disease of native coronary artery without angina pectoris: Secondary | ICD-10-CM

## 2017-11-25 DIAGNOSIS — I420 Dilated cardiomyopathy: Secondary | ICD-10-CM | POA: Diagnosis not present

## 2017-11-25 LAB — BASIC METABOLIC PANEL
Anion gap: 12 (ref 5–15)
BUN: 18 mg/dL (ref 6–20)
CO2: 23 mmol/L (ref 22–32)
Calcium: 9.2 mg/dL (ref 8.9–10.3)
Chloride: 102 mmol/L (ref 101–111)
Creatinine, Ser: 1.67 mg/dL — ABNORMAL HIGH (ref 0.61–1.24)
GFR calc Af Amer: 49 mL/min — ABNORMAL LOW (ref >60)
GFR calc non Af Amer: 42 mL/min — ABNORMAL LOW (ref >60)
Glucose, Bld: 105 mg/dL — ABNORMAL HIGH (ref 65–99)
Potassium: 3.6 mmol/L (ref 3.5–5.1)
Sodium: 137 mmol/L (ref 135–145)

## 2017-11-25 LAB — TROPONIN I
Troponin I: <0.03 ng/mL (ref <0.03)
Troponin I: <0.03 ng/mL (ref <0.03)

## 2017-11-25 LAB — ECHOCARDIOGRAM COMPLETE
Height: 69.5 in
Weight: 3160 oz

## 2017-11-25 MED ORDER — CARVEDILOL 12.5 MG PO TABS
12.5000 mg | ORAL_TABLET | Freq: Once | ORAL | Status: AC
  Administered 2017-11-25: 12.5 mg via ORAL
  Filled 2017-11-25: qty 1

## 2017-11-25 MED ORDER — SACUBITRIL-VALSARTAN 24-26 MG PO TABS
1.0000 | ORAL_TABLET | Freq: Two times a day (BID) | ORAL | Status: DC
  Administered 2017-11-25 – 2017-11-27 (×5): 1 via ORAL
  Filled 2017-11-25 (×7): qty 1

## 2017-11-25 MED ORDER — CARVEDILOL 25 MG PO TABS
25.0000 mg | ORAL_TABLET | Freq: Two times a day (BID) | ORAL | Status: DC
  Administered 2017-11-25 – 2017-11-27 (×4): 25 mg via ORAL
  Filled 2017-11-25 (×2): qty 1
  Filled 2017-11-25: qty 2
  Filled 2017-11-25: qty 1

## 2017-11-25 MED ORDER — CARVEDILOL 12.5 MG PO TABS: 12.5000 mg | ORAL_TABLET | Freq: Two times a day (BID) | ORAL | Status: DC

## 2017-11-25 MED ORDER — ATORVASTATIN CALCIUM 80 MG PO TABS
80.0000 mg | ORAL_TABLET | Freq: Every day | ORAL | Status: DC
  Administered 2017-11-25 – 2017-11-26 (×2): 80 mg via ORAL
  Filled 2017-11-25 (×2): qty 1

## 2017-11-25 NOTE — Progress Notes (Addendum)
Benefit check in progress for Shawn Stall [facility] [phone]  RE: Benefit check  Received: Today  Message Contents  Memory Argue CMA        # 3. Jamelle Haring [facility] @ [facility] # [phone]    ENTRESTO 24-26 MG BID  COVER- YES  CO-PAY- ZERO DOLLARS  PRIOR APPROVAL- NO   PREFERRED PHARMACY : YES  [facility]

## 2017-11-25 NOTE — Progress Notes (Signed)
Patient is alert and oriented with no complaints. CCMD called to report V-tach how ever patient denies heart flutters or dizziness. Just completed bath and gown change. Will notify MD about low mag and high blood pressures

## 2017-11-25 NOTE — Plan of Care (Signed)
Nutrition Education Note  RD consulted for nutrition education regarding CHF.  Spoke with pt at bedside, who reports good appetite. He consumed all of his meal of a grilled chicken sandwich, fruit cup, and baked chips. He reports he usually consumes 3 meals per day (Breakfast: chicken biscuit from [employer], Lunch: leftovers from dinner, Dinner: spaghetti or grilled chicken with collard greens). Pt reports he will often eat out on the weekends at places such as chili's and [employer]. Pt reports wife does most of his cooking at home and does not add salt to cooking or at the table. Discussed ways to choose healthier items when eating out.   RD provided "Low Sodium Nutrition Therapy" handout from the Academy of Nutrition and Dietetics. Reviewed patient's dietary recall. Provided examples on ways to decrease sodium intake in diet. Discouraged intake of processed foods and use of salt shaker. Encouraged fresh fruits and vegetables as well as whole grain sources of carbohydrates to maximize fiber intake.   RD discussed why it is important for patient to adhere to diet recommendations, and emphasized the role of fluids, foods to avoid, and importance of weighing self daily. Teach back method used.  Expect fair compliance.  Body mass index is 28.75 kg/m. Pt meets criteria for overweight based on current BMI.  Current diet order is 2 grams sodium, patient is consuming approximately 100% of meals at this time. Labs and medications reviewed. No further nutrition interventions warranted at this time. RD contact information provided. If additional nutrition issues arise, please re-consult RD.   Ethelean Colla A. Jimmye Norman, RD, LDN, CDE Pager: [phone] After hours Pager: [phone]

## 2017-11-25 NOTE — Progress Notes (Signed)
  Echocardiogram 2D Echocardiogram has been performed.  Merrie Roof F 11/25/2017, 3:29 PM

## 2017-11-25 NOTE — Progress Notes (Signed)
Patient blood pressure remain elevated. MD ordered an extra dose of coreg and to recheck in one hour post administration

## 2017-11-25 NOTE — Progress Notes (Signed)
Pt's magnesium is 1.4, Dr. Radford Pax notified, awaiting call back.

## 2017-11-25 NOTE — Progress Notes (Signed)
Patient bathing with artifact

## 2017-11-25 NOTE — Progress Notes (Signed)
Pt educated about safety and importance of bed alarm during the night however pt refuses to be on bed alarm. Will continue to round on patient.   Labrina Lines, RN    

## 2017-11-25 NOTE — Progress Notes (Signed)
Progress Note  Patient Name: [name] Date of Encounter: 11/25/2017  Primary Cardiologist: Peter Martinique, MD   Subjective   Laying in bed, fairly comfortable, feels better.  Still mild shortness of breath, no chest pain.  Inpatient Medications    Scheduled Meds: . aspirin EC  81 mg Oral Daily  . atorvastatin  80 mg Oral q1800  . carvedilol  12.5 mg Oral BID WC  . furosemide  80 mg Intravenous BID  . heparin  5,000 Units Subcutaneous Q8H  . hydrALAZINE  25 mg Oral Q8H  . isosorbide mononitrate  30 mg Oral Daily  . potassium chloride  20 mEq Oral BID  . sacubitril-valsartan  1 tablet Oral BID   Continuous Infusions:  PRN Meds: acetaminophen, ondansetron (ZOFRAN) IV   Vital Signs    Vitals:   11/24/17 1956 11/24/17 2018 11/25/17 0021 11/25/17 0658  BP: (!) 216/103 (!) 182/89 (!) 188/87 (!) 185/92  Pulse: 79 79 72 72  Resp:  18 18 17   Temp:  98.7 F (37.1 C) 98.5 F (36.9 C) 98.4 F (36.9 C)  TempSrc:  Oral Oral Oral  SpO2:  98% 96% 95%  Weight:    197 lb 8 oz (89.6 kg)  Height:        Intake/Output Summary (Last 24 hours) at 11/25/2017 0928 Last data filed at 11/25/2017 0539 Gross per 24 hour  Intake 720 ml  Output 2150 ml  Net -1430 ml   Filed Weights   11/24/17 1736 11/25/17 0658  Weight: 207 lb 1.6 oz (93.9 kg) 197 lb 8 oz (89.6 kg)    Telemetry    No adverse arrhythmias- Personally Reviewed  ECG    Sinus rhythm- Personally Reviewed  Physical Exam   GEN: No acute distress.  Neck: No JVD Cardiac:  Regular rate and rhythm, positive S4, no murmurs, rubs, or gallops.  Respiratory: Clear to auscultation bilaterally. GI: Soft, nontender, non-distended  MS:  2+ lower extremity edema; No deformity. Neuro:  Nonfocal  Psych: Normal affect   Labs    Chemistry Recent Labs  Lab 11/24/17 1900 11/25/17 0633  NA 140 137  K 3.5 3.6  CL 109 102  CO2 21* 23  GLUCOSE 96 105*  BUN 18 18  CREATININE 1.59* 1.67*  CALCIUM 9.1 9.2  PROT 9.6*   --   ALBUMIN 3.1*  --   AST 20  --   ALT 13*  --   ALKPHOS 140*  --   BILITOT 3.4*  --   GFRNONAA 45* 42*  GFRAA 52* 49*  ANIONGAP 10 12     Hematology Recent Labs  Lab 11/24/17 1900  WBC 5.6  RBC 4.77  HGB 12.9*  HCT 39.4  MCV 82.6  MCH 27.0  MCHC 32.7  RDW 20.4*  PLT 168    Cardiac Enzymes Recent Labs  Lab 11/24/17 1900 11/25/17 0030 11/25/17 0633  TROPONINI <0.03 <0.03 <0.03   No results for input(s): TROPIPOC in the last 168 hours.   BNP Recent Labs  Lab 11/24/17 1900  BNP 914.8*     DDimer No results for input(s): DDIMER in the last 168 hours.   Radiology    No results found.  Cardiac Studies   Cardiac catheterization 2006-medical therapy.  SVG to obtuse marginal and SVG to PDA patent.  Occluded radial graft to first marginal, atretic LIMA to diagonal, moderate to severe LV dysfunction  In 2006, 50% left renal artery stenosis, however renal duplex was normal  Patient Profile  62 y.o. male coronary artery disease CABG 2002 noncompliance LV dysfunction with severe hypertension, hypertensive urgency, acute on chronic systolic heart failure  Assessment & Plan    Acute on chronic systolic heart failure in the setting of hypertension noncompliance - IV diuresis, Lasix 80 mg twice a day.  Continue.  Echocardiogram, restarting medications - -1.4 L out.  Creatinine is slightly increased.  We will start Entresto low-dose.  Carefully monitor creatinine.  Expect subtle bump.  If able to tolerate, may be able to discontinue hydralazine.  Case manager for cost of medications.  I think with his markedly elevated blood pressure, he will be able to tolerate an increased dose of beta-blocker at this point.  CAD post CABG - No evidence of ischemia, troponin negative continue with aggressive medical therapy, aspirin.  I will start atorvastatin 80 mg.  Peripheral vascular disease with 50% renal artery stenosis -Negative renal duplex in the past.  Continue with  aggressive medical management for hypertension.  Hypertensive urgency -Likely stemming from noncompliance.  Still highly elevated.  I will increase his carvedilol to 12.5 mg twice a day.  Starting [facility] as well.  Chronic kidney disease stage III -Creatinine 1.59-1.67  For questions or updates, please contact [place] Please consult [email or web address] for contact info under Cardiology/STEMI.      Signed, Candee Furbish, MD  11/25/2017, 9:28 AM

## 2017-11-26 DIAGNOSIS — I5031 Acute diastolic (congestive) heart failure: Secondary | ICD-10-CM

## 2017-11-26 DIAGNOSIS — R0602 Shortness of breath: Secondary | ICD-10-CM | POA: Diagnosis present

## 2017-11-26 DIAGNOSIS — I2510 Atherosclerotic heart disease of native coronary artery without angina pectoris: Secondary | ICD-10-CM | POA: Diagnosis not present

## 2017-11-26 DIAGNOSIS — I2729 Other secondary pulmonary hypertension: Secondary | ICD-10-CM | POA: Diagnosis present

## 2017-11-26 DIAGNOSIS — I739 Peripheral vascular disease, unspecified: Secondary | ICD-10-CM | POA: Diagnosis present

## 2017-11-26 DIAGNOSIS — I161 Hypertensive emergency: Secondary | ICD-10-CM | POA: Diagnosis present

## 2017-11-26 DIAGNOSIS — I472 Ventricular tachycardia: Secondary | ICD-10-CM | POA: Diagnosis present

## 2017-11-26 DIAGNOSIS — Z8249 Family history of ischemic heart disease and other diseases of the circulatory system: Secondary | ICD-10-CM | POA: Diagnosis not present

## 2017-11-26 DIAGNOSIS — I160 Hypertensive urgency: Secondary | ICD-10-CM | POA: Diagnosis not present

## 2017-11-26 DIAGNOSIS — I5021 Acute systolic (congestive) heart failure: Secondary | ICD-10-CM | POA: Diagnosis not present

## 2017-11-26 DIAGNOSIS — I420 Dilated cardiomyopathy: Secondary | ICD-10-CM | POA: Diagnosis present

## 2017-11-26 DIAGNOSIS — E785 Hyperlipidemia, unspecified: Secondary | ICD-10-CM | POA: Diagnosis present

## 2017-11-26 DIAGNOSIS — I5043 Acute on chronic combined systolic (congestive) and diastolic (congestive) heart failure: Secondary | ICD-10-CM | POA: Diagnosis present

## 2017-11-26 DIAGNOSIS — I130 Hypertensive heart and chronic kidney disease with heart failure and stage 1 through stage 4 chronic kidney disease, or unspecified chronic kidney disease: Secondary | ICD-10-CM | POA: Diagnosis present

## 2017-11-26 DIAGNOSIS — Z833 Family history of diabetes mellitus: Secondary | ICD-10-CM | POA: Diagnosis not present

## 2017-11-26 DIAGNOSIS — Z9119 Patient's noncompliance with other medical treatment and regimen: Secondary | ICD-10-CM | POA: Diagnosis not present

## 2017-11-26 DIAGNOSIS — Z951 Presence of aortocoronary bypass graft: Secondary | ICD-10-CM | POA: Diagnosis not present

## 2017-11-26 DIAGNOSIS — I701 Atherosclerosis of renal artery: Secondary | ICD-10-CM | POA: Diagnosis present

## 2017-11-26 DIAGNOSIS — Z79899 Other long term (current) drug therapy: Secondary | ICD-10-CM | POA: Diagnosis not present

## 2017-11-26 DIAGNOSIS — N183 Chronic kidney disease, stage 3 (moderate): Secondary | ICD-10-CM | POA: Diagnosis present

## 2017-11-26 DIAGNOSIS — Z7982 Long term (current) use of aspirin: Secondary | ICD-10-CM | POA: Diagnosis not present

## 2017-11-26 LAB — BASIC METABOLIC PANEL
Anion gap: 11 (ref 5–15)
BUN: 23 mg/dL — ABNORMAL HIGH (ref 6–20)
CO2: 27 mmol/L (ref 22–32)
Calcium: 9.4 mg/dL (ref 8.9–10.3)
Chloride: 99 mmol/L — ABNORMAL LOW (ref 101–111)
Creatinine, Ser: 1.77 mg/dL — ABNORMAL HIGH (ref 0.61–1.24)
GFR calc Af Amer: 46 mL/min — ABNORMAL LOW (ref >60)
GFR calc non Af Amer: 39 mL/min — ABNORMAL LOW (ref >60)
Glucose, Bld: 147 mg/dL — ABNORMAL HIGH (ref 65–99)
Potassium: 3.6 mmol/L (ref 3.5–5.1)
Sodium: 137 mmol/L (ref 135–145)

## 2017-11-26 LAB — MAGNESIUM: Magnesium: 1.4 mg/dL — ABNORMAL LOW (ref 1.7–2.4)

## 2017-11-26 MED ORDER — POTASSIUM CHLORIDE CRYS ER 20 MEQ PO TBCR
20.0000 meq | EXTENDED_RELEASE_TABLET | Freq: Once | ORAL | Status: AC
  Administered 2017-11-26: 20 meq via ORAL
  Filled 2017-11-26: qty 1

## 2017-11-26 MED ORDER — MAGNESIUM SULFATE 4 GM/100ML IV SOLN
4.0000 g | Freq: Once | INTRAVENOUS | Status: AC
  Administered 2017-11-26: 4 g via INTRAVENOUS
  Filled 2017-11-26: qty 100

## 2017-11-26 NOTE — Progress Notes (Signed)
Per CCMD patient had 9 beats of wide QRS. Patient asymptomatic, asleep.  Will continue to monitor.  Deyonna Fitzsimmons, RN

## 2017-11-26 NOTE — Progress Notes (Addendum)
Progress Note  Patient Name: [name] Date of Encounter: 11/26/2017  Primary Cardiologist: Peter Martinique, MD   Subjective   No new complaints this morning. He notes improvement in his LE swelling. Denies chest pain, palpitations, or SOB.  Inpatient Medications    Scheduled Meds: . aspirin EC  81 mg Oral Daily  . atorvastatin  80 mg Oral q1800  . carvedilol  25 mg Oral BID WC  . furosemide  80 mg Intravenous BID  . heparin  5,000 Units Subcutaneous Q8H  . hydrALAZINE  25 mg Oral Q8H  . isosorbide mononitrate  30 mg Oral Daily  . potassium chloride  20 mEq Oral BID  . sacubitril-valsartan  1 tablet Oral BID   Continuous Infusions:  PRN Meds: acetaminophen, ondansetron (ZOFRAN) IV   Vital Signs    Vitals:   11/25/17 2156 11/25/17 2340 11/26/17 0601 11/26/17 0836  BP: (!) 185/88 (!) 176/95 (!) 170/96 (!) 165/98  Pulse:  81 75 78  Resp:  18 18 18   Temp:  98.2 F (36.8 C) 98.7 F (37.1 C) 98.4 F (36.9 C)  TempSrc:  Oral Oral Oral  SpO2:  96% 96% 98%  Weight:   187 lb 8 oz (85 kg)   Height:        Intake/Output Summary (Last 24 hours) at 11/26/2017 0919 Last data filed at 11/26/2017 0837 Gross per 24 hour  Intake 960 ml  Output 1900 ml  Net -940 ml   Filed Weights   11/24/17 1736 11/25/17 0658 11/26/17 0601  Weight: 207 lb 1.6 oz (93.9 kg) 197 lb 8 oz (89.6 kg) 187 lb 8 oz (85 kg)    Telemetry    ~20 beat run of NSVT yesterday around 12:00pm, otherwise NSR with occasional PVCs - Personally Reviewed  Physical Exam   GEN: Laying in bed in no acute distress.   Neck: No JVD, no carotid bruits Cardiac: RRR, no murmurs, rubs, or gallops.  Respiratory: Clear to auscultation bilaterally, no wheezes/ rales/ rhonchi GI: NABS, Soft, nontender, non-distended  MS: 2+ LE edema; No deformity. Neuro:  Nonfocal, moving all extremities spontaneously Psych: Normal affect   Labs    Chemistry Recent Labs  Lab 11/24/17 1900 11/25/17 0633 11/26/17 0623  NA  140 137 137  K 3.5 3.6 3.6  CL 109 102 99*  CO2 21* 23 27  GLUCOSE 96 105* 147*  BUN 18 18 23*  CREATININE 1.59* 1.67* 1.77*  CALCIUM 9.1 9.2 9.4  PROT 9.6*  --   --   ALBUMIN 3.1*  --   --   AST 20  --   --   ALT 13*  --   --   ALKPHOS 140*  --   --   BILITOT 3.4*  --   --   GFRNONAA 45* 42* 39*  GFRAA 52* 49* 46*  ANIONGAP 10 12 11      Hematology Recent Labs  Lab 11/24/17 1900  WBC 5.6  RBC 4.77  HGB 12.9*  HCT 39.4  MCV 82.6  MCH 27.0  MCHC 32.7  RDW 20.4*  PLT 168    Cardiac Enzymes Recent Labs  Lab 11/24/17 1900 11/25/17 0030 11/25/17 0633  TROPONINI <0.03 <0.03 <0.03   No results for input(s): TROPIPOC in the last 168 hours.   BNP Recent Labs  Lab 11/24/17 1900  BNP 914.8*     DDimer No results for input(s): DDIMER in the last 168 hours.   Radiology    Dg Chest [facility]  1 View  Result Date: 11/25/2017 CLINICAL DATA:  Shortness of breath. EXAM: PORTABLE CHEST 1 VIEW COMPARISON:  Radiographs Nov 14, 2004. FINDINGS: Stable cardiomegaly. Status post coronary bypass graft. No pneumothorax or pleural effusion is noted. Both lungs are clear. The visualized skeletal structures are unremarkable. IMPRESSION: No acute cardiopulmonary abnormality seen. Electronically Signed   By: Marijo Conception, M.D.   On: 11/25/2017 10:07    Cardiac Studies   Echocardiogram 11/25/17: Study Conclusions  - Left ventricle: The cavity size was normal. There was moderate   concentric hypertrophy. Systolic function was normal. The   estimated ejection fraction was in the range of 50% to 55%. Wall   motion was normal; there were no regional wall motion   abnormalities. Doppler parameters are consistent with abnormal   left ventricular relaxation (grade 1 diastolic dysfunction).   Doppler parameters are consistent with high ventricular filling   pressure. - Aortic valve: Transvalvular velocity was within the normal range.   There was no stenosis. There was no regurgitation. -  Mitral valve: Transvalvular velocity was within the normal range.   There was no evidence for stenosis. There was no regurgitation. - Left atrium: The atrium was moderately dilated. - Right ventricle: The cavity size was normal. Wall thickness was   normal. Systolic function was normal. - Right atrium: The atrium was severely dilated. - Atrial septum: No defect or patent foramen ovale was identified   by color flow Doppler. - Tricuspid valve: There was trivial regurgitation. - Pulmonary arteries: Systolic pressure was within the normal   range. PA peak pressure: 36 mm Hg (S).   Patient Profile     62 y.o. male with PMH of CAD s/p CABG 2002, severe HTN, chronic systolic CHF (EF normalized on echo's since 2009), HLD, and medication non-compliance, who presented outpatient 11/24/17 and was found to have hypertensive urgency. Cardiology admitted for further management of hypertensive urgency and suspected acute CHF.   Assessment & Plan    1. Acute on chronic combined CHF: noted to have weight gain, LE edema, and +JVD. BNP 914. Suspected acute on chronic systolic CHF in the setting of medication non-compliance and hypertensive urgency, however echo 11/25/17 with EF 50-55% and G1DD. He was started on IV lasix with UOP net -2.1L this admission and -961mL in the last 24 hours. Weight significantly decreased from admission: 207>197>187lbs. Cr up to 1.77 today (baseline 1.5).  - Continue IV lasix 80mg  BID - Continue entresto and coreg - Continue to monitor strict I&Os and daily weights. - Will order compression stockings  2. Hypertensive urgency: BP overall improved this morning, although still hypertensive at 165/98. He was started on coreg which has been uptitrated, entresto, and hydralazine  - Continue current regimen today  3. CAD s/p CABG: no anginal complaints. Trop negative x3 this admission.  - Continue ASA, statin, imdur, and coreg  4. CKD stage III: Cr up to 1.77 today (baseline 1.5) -  Continue to monitor closely with diuresis  5. NSVT: ~20 beat run of NSVT yesterday around noon. K 3.6 today with plans to replete 40 mEq today.  - Will check magnesium  - Will replete as needed to maintain K>4, Mg>2    For questions or updates, please contact [place] Please consult [email or web address] for contact info under Cardiology/STEMI.      Signed, Abigail Butts, PA-C  11/26/2017, 9:19 AM   [phone]  Personally seen and examined. Agree with above.  Overall no significant shortness of breath or  chest pain his swelling is improving.  Creatinine is slightly up again today at 1.7, weight is down again significantly.  Excellent.  GEN: Well nourished, well developed, in no acute distress  HEENT: normal  Neck: no JVD, carotid bruits, or masses Cardiac: RRR; no murmurs, rubs, or gallops, 2+ bilateral lower extremity edema  Respiratory:  clear to auscultation bilaterally, normal work of breathing GI: soft, nontender, nondistended, + BS MS: no deformity or atrophy  Skin: warm and dry, no rash Neuro:  Alert and Oriented x 3, Strength and sensation are intact Psych: euthymic mood, full affect  Labs personally reviewed  Acute on chronic diastolic heart failure in the setting of hypertensive emergency  -Improved blood pressure with increasing dosage of medication.  Continue with current plan.  No changes today medications.  Tomorrow if creatinine continues to rise, we will go ahead and switch him over to p.o. Lasix.  Once again I am willing to tolerate some increase in creatinine given his diuresis as well as start of Entresto.  Candee Furbish, MD

## 2017-11-26 NOTE — Evaluation (Signed)
Physical Therapy Evaluation and Discharge Patient Details Name: [name] MRN: [id number] DOB: 1955/09/30 Today's Date: 11/26/2017   History of Present Illness  62 y.o. male admitted with worsening CHF symptoms over last 2 weeks, including blood pressure elevation, volume overload and SOB. PMH includes: CAD, CABG 2006.      Clinical Impression  Patient evaluated by Physical Therapy with no further acute PT needs identified. All education has been completed and the patient has no further questions. Patient lives with wife in 2 level home, independent and working. Upon eval pt mobilizing at baseline, walking unit and stairs with supevision. SpO2 98% on RA with acitivty. BP 186/93 after activity. Lengthy discussion with family over importance of compliance with medications, exercise, and symptom monitoring. Pt and family verbalize understanding. No concerns for safety from mobility standpoint.  See below for any follow-up Physical Therapy or equipment needs. PT is signing off. Thank you for this referral.     Follow Up Recommendations No PT follow up    Equipment Recommendations  None recommended by PT    Recommendations for Other Services       Precautions / Restrictions Precautions Precautions: None      Mobility  Bed Mobility Overal bed mobility: Modified Independent                Transfers Overall transfer level: Modified independent                  Ambulation/Gait Ambulation/Gait assistance: Modified independent (Device/Increase time) Ambulation Distance (Feet): 250 Feet Assistive device: None Gait Pattern/deviations: WFL(Within Functional Limits)        Stairs Stairs: Yes Stairs assistance: Modified independent (Device/Increase time) Stair Management: One rail Right;Alternating pattern Number of Stairs: 12    Wheelchair Mobility    Modified Rankin (Stroke Patients Only)       Balance Overall balance assessment: No apparent balance  deficits (not formally assessed)                                           Pertinent Vitals/Pain Pain Assessment: No/denies pain    Home Living Family/patient expects to be discharged to:: Private residence Living Arrangements: Spouse/significant other Available Help at Discharge: Family;Available 24 hours/day Type of Home: House Home Access: Stairs to enter Entrance Stairs-Rails: Psychiatric nurse of Steps: 6 Home Layout: Two level Home Equipment: None      Prior Function Level of Independence: Independent         Comments: independent, working     Journalist, newspaper        Extremity/Trunk Assessment   Upper Extremity Assessment Upper Extremity Assessment: Overall WFL for tasks assessed    Lower Extremity Assessment Lower Extremity Assessment: Overall WFL for tasks assessed       Communication   Communication: No difficulties  Cognition Arousal/Alertness: Awake/alert Behavior During Therapy: WFL for tasks assessed/performed Overall Cognitive Status: Within Functional Limits for tasks assessed                                        General Comments General comments (skin integrity, edema, etc.): Extensive discussion with pt and family over importance of compliance with medications and exercise.     Exercises     Assessment/Plan    PT Assessment Patient needs continued  PT services  PT Problem List Cardiopulmonary status limiting activity       PT Treatment Interventions      PT Goals (Current goals can be found in the Care Plan section)  Acute Rehab PT Goals Patient Stated Goal: go home PT Goal Formulation: With patient Potential to Achieve Goals: Good    Frequency     Barriers to discharge        Co-evaluation               AM-PAC PT "6 Clicks" Daily Activity  Outcome Measure Difficulty turning over in bed (including adjusting bedclothes, sheets and blankets)?: None Difficulty moving  from lying on back to sitting on the side of the bed? : None Difficulty sitting down on and standing up from a chair with arms (e.g., wheelchair, bedside commode, etc,.)?: None Help needed moving to and from a bed to chair (including a wheelchair)?: None Help needed walking in hospital room?: None Help needed climbing 3-5 steps with a railing? : None 6 Click Score: 24    End of Session Equipment Utilized During Treatment: Gait belt Activity Tolerance: Patient tolerated treatment well Patient left: in bed;with call bell/phone within reach;with family/visitor present Nurse Communication: Mobility status;Other (comment)(Vitals)      Time: 9753-0051 PT Time Calculation (min) (ACUTE ONLY): 28 min   Charges:   PT Evaluation $PT Eval Low Complexity: 1 Low PT Treatments $Gait Training: 8-22 mins   PT G Codes:        Reinaldo Berber, PT, DPT Acute Rehab Services Pager: [phone]    Reinaldo Berber 11/26/2017, 6:57 PM

## 2017-11-27 DIAGNOSIS — I5021 Acute systolic (congestive) heart failure: Secondary | ICD-10-CM

## 2017-11-27 LAB — BASIC METABOLIC PANEL
Anion gap: 12 (ref 5–15)
BUN: 22 mg/dL — ABNORMAL HIGH (ref 6–20)
CO2: 29 mmol/L (ref 22–32)
Calcium: 9.3 mg/dL (ref 8.9–10.3)
Chloride: 98 mmol/L — ABNORMAL LOW (ref 101–111)
Creatinine, Ser: 1.73 mg/dL — ABNORMAL HIGH (ref 0.61–1.24)
GFR calc Af Amer: 47 mL/min — ABNORMAL LOW (ref >60)
GFR calc non Af Amer: 41 mL/min — ABNORMAL LOW (ref >60)
Glucose, Bld: 132 mg/dL — ABNORMAL HIGH (ref 65–99)
Potassium: 3.5 mmol/L (ref 3.5–5.1)
Sodium: 139 mmol/L (ref 135–145)

## 2017-11-27 MED ORDER — HYDRALAZINE HCL 25 MG PO TABS: 25.0000 mg | ORAL_TABLET | Freq: Three times a day (TID) | ORAL | 6 refills | Status: DC

## 2017-11-27 MED ORDER — ISOSORBIDE MONONITRATE ER 30 MG PO TB24: 30.0000 mg | ORAL_TABLET | Freq: Every day | ORAL | 3 refills | Status: DC

## 2017-11-27 MED ORDER — SACUBITRIL-VALSARTAN 24-26 MG PO TABS: 1.0000 | ORAL_TABLET | Freq: Two times a day (BID) | ORAL | 6 refills | Status: DC

## 2017-11-27 MED ORDER — POTASSIUM CHLORIDE CRYS ER 20 MEQ PO TBCR: 20.0000 meq | EXTENDED_RELEASE_TABLET | Freq: Every day | ORAL | Status: DC

## 2017-11-27 MED ORDER — FUROSEMIDE 40 MG PO TABS: 40.0000 mg | ORAL_TABLET | Freq: Every day | ORAL | Status: DC

## 2017-11-27 NOTE — Discharge Summary (Addendum)
Discharge Summary    Patient ID: [name],  MRN: [id number], DOB/AGE: Dec 03, 1955 62 y.o.  Admit date: 11/24/2017 Discharge date: 11/27/2017  Primary Care Provider: Patient, No Pcp Per Primary Cardiologist: Joseph Martinique, MD  Discharge Diagnoses    Principal Problem:   Acute on chronic combined systolic and diastolic CHF (congestive heart failure) ([place]) Active Problems:   CAD (coronary artery disease)   Dilated cardiomyopathy ([place])   HTN (hypertension)   PAD (peripheral artery disease) ([place])   Hyperlipemia   Allergies No Known Allergies  Diagnostic Studies/Procedures    Echo 11/25/17:  Study Conclusions - Left ventricle: The cavity size was normal. There was moderate   concentric hypertrophy. Systolic function was normal. The   estimated ejection fraction was in the range of 50% to 55%. Wall   motion was normal; there were no regional wall motion   abnormalities. Doppler parameters are consistent with abnormal   left ventricular relaxation (grade 1 diastolic dysfunction).   Doppler parameters are consistent with high ventricular filling   pressure. - Aortic valve: Transvalvular velocity was within the normal range.   There was no stenosis. There was no regurgitation. - Mitral valve: Transvalvular velocity was within the normal range.   There was no evidence for stenosis. There was no regurgitation. - Left atrium: The atrium was moderately dilated. - Right ventricle: The cavity size was normal. Wall thickness was   normal. Systolic function was normal. - Right atrium: The atrium was severely dilated. - Atrial septum: No defect or patent foramen ovale was identified   by color flow Doppler. - Tricuspid valve: There was trivial regurgitation. - Pulmonary arteries: Systolic pressure was within the normal   range. PA peak pressure: 36 mm Hg (S).   History of Present Illness     [name] was seen in outpatient clinic on 11/24/17 with significant volume  overload after missing previous follow up appt and apparently stopping some of his medications.  His wife reported an overall decline in his health over the preceding 2 weeks with increasing shortness of breath and swelling requiring them to buy new shoes. His weight was up 13 lbs. At that visit, it was unclear what medications he has been taking. In consultation with Dr. Tamala Welch (DOD), the decision was made to admit him to the hospital for volume management.   Hospital Course     Consultants: none  Acute on chronic systolic and diastolic heart failure He was admitted to [facility] and underwent aggressive IV diuresis. Echo this admission with improved LVEF to 50-55%, from 20%. His weight today is 182 lbs, down from 207 lbs on admission. He is overall net negative 4.9 L with 3L urine output yesterday on 80 mg IV lasix BID.  Entresto started this admission, plan to up-titrate outpatient. He was discharged on 40 mg PO lasix daily with Kdur 20 mEq daily. He will call with an increase in weight of 2 lbs in one day or 5 lbs in one week. K at discharge was 3.5.  Discharge medications: Coreg 25 mg BID Hydralazine 25 mg TID imdur 30 mg daily entresto 24-26 BID Lasix 40 mg daily Kdur 20 mEq daily   HTN Pressures remain elevated. Plan to keep a pressure log at home. Titrate medications at outpatient visit if still elevated. Consider addition of spironolactone.    CAD s/p CABG, last cath in 2006 He denies chest pain. No further workup at this time, but may need a repeat evaluation if symptomatic.  CKD stage III sCr 1.73, making good urine. Baseline creatinine 1.5. I suspect his creatinine will improve with continued diuresis at home and with transition to PO dosing. Collect BMP at outpatient follow up.   _____________  Discharge Vitals Blood pressure (!) 165/89, pulse 79, temperature 98.1 F (36.7 C), temperature source Oral, resp. rate 18, height 5' 9.5" (1.765 m), weight 182 lb 1.6 oz (82.6 kg),  SpO2 98 %.  Filed Weights   11/25/17 0658 11/26/17 0601 11/27/17 0525  Weight: 197 lb 8 oz (89.6 kg) 187 lb 8 oz (85 kg) 182 lb 1.6 oz (82.6 kg)    Labs & Radiologic Studies    CBC Recent Labs    11/24/17 1900  WBC 5.6  NEUTROABS 4.1  HGB 12.9*  HCT 39.4  MCV 82.6  PLT 829   Basic Metabolic Panel Recent Labs    11/24/17 1900  11/26/17 0623 11/27/17 0548  NA 140   < > 137 139  K 3.5   < > 3.6 3.5  CL 109   < > 99* 98*  CO2 21*   < > 27 29  GLUCOSE 96   < > 147* 132*  BUN 18   < > 23* 22*  CREATININE 1.59*   < > 1.77* 1.73*  CALCIUM 9.1   < > 9.4 9.3  MG 1.4*  --  1.4*  --    < > = values in this interval not displayed.   Liver Function Tests Recent Labs    11/24/17 1900  AST 20  ALT 13*  ALKPHOS 140*  BILITOT 3.4*  PROT 9.6*  ALBUMIN 3.1*   No results for input(s): LIPASE, AMYLASE in the last 72 hours. Cardiac Enzymes Recent Labs    11/24/17 1900 11/25/17 0030 11/25/17 0633  TROPONINI <0.03 <0.03 <0.03   BNP Invalid input(s): POCBNP D-Dimer No results for input(s): DDIMER in the last 72 hours. Hemoglobin A1C No results for input(s): HGBA1C in the last 72 hours. Fasting Lipid Panel No results for input(s): CHOL, HDL, LDLCALC, TRIG, CHOLHDL, LDLDIRECT in the last 72 hours. Thyroid Function Tests Recent Labs    11/24/17 1900  TSH 0.510   _____________  Dg Chest Port 1 View  Result Date: 11/25/2017 CLINICAL DATA:  Shortness of breath. EXAM: PORTABLE CHEST 1 VIEW COMPARISON:  Radiographs Nov 14, 2004. FINDINGS: Stable cardiomegaly. Status post coronary bypass graft. No pneumothorax or pleural effusion is noted. Both lungs are clear. The visualized skeletal structures are unremarkable. IMPRESSION: No acute cardiopulmonary abnormality seen. Electronically Signed   By: Marijo Conception, M.D.   On: 11/25/2017 10:07   Disposition   Pt is being discharged home today in good condition.  Follow-up Plans & Appointments   Office will call with appt.    Follow-up Information    Welch, Joseph M, MD Follow up in 1 week(s).   Specialty:  Cardiology Contact information: [address] [place] [address] [phone]          Discharge Instructions    Diet - low sodium heart healthy   Complete by:  As directed    Increase activity slowly   Complete by:  As directed       Discharge Medications   Allergies as of 11/27/2017   No Known Allergies     Medication List    STOP taking these medications   cloNIDine 0.1 MG tablet Commonly known as:  CATAPRES   isosorbide dinitrate 20 MG tablet Commonly known as:  ISORDIL   lisinopril 40 MG tablet Commonly known as:  PRINIVIL,ZESTRIL   spironolactone 50 MG tablet Commonly known as:  ALDACTONE     TAKE these medications   aspirin 325 MG EC tablet Take 81 mg by mouth daily.   atorvastatin 80 MG tablet Commonly known as:  LIPITOR Take 1 tablet (80 mg total) by mouth daily.   carvedilol 25 MG tablet Commonly known as:  COREG TAKE 1 TABLE BY MOUTH 2 TIMES DAILY WITH A MEAL.   furosemide 40 MG tablet Commonly known as:  LASIX Take 1 tablet (40 mg total) by mouth daily.   hydrALAZINE 25 MG tablet Commonly known as:  APRESOLINE Take 1 tablet (25 mg total) by mouth every 8 (eight) hours. What changed:    medication strength  how much to take  when to take this   ibuprofen 200 MG tablet Commonly known as:  ADVIL,MOTRIN Take 200 mg by mouth every 6 (six) hours as needed for moderate pain (for knee).   isosorbide mononitrate 30 MG 24 hr tablet Commonly known as:  IMDUR Take 1 tablet (30 mg total) by mouth daily. Start taking on:  11/28/2017   potassium chloride SA 20 MEQ tablet Commonly known as:  K-DUR,KLOR-CON Take 1 tablet (20 mEq total) by mouth daily.   sacubitril-valsartan 24-26 MG Commonly known as:  ENTRESTO Take 1 tablet by mouth 2 (two) times daily.         Outstanding Labs/Studies   BMP and pressure  Office will call with  follow up appt  Duration of Discharge Encounter   Greater than 30 minutes including physician time.  Signed, [place], [place] 11/27/2017, 10:24 AM  Personally seen and examined. Agree with above.   Primary Cardiologist: Joseph Martinique, MD   Subjective   Overall feeling much better.  No shortness of breath.  No chest pain.  Leg edema has reduced.  Wife in room.  He works for [employer], walks around quite a bit at [employer].  Inpatient Medications    Scheduled Meds: . aspirin EC  81 mg Oral Daily  . atorvastatin  80 mg Oral q1800  . carvedilol  25 mg Oral BID WC  . furosemide  80 mg Intravenous BID  . heparin  5,000 Units Subcutaneous Q8H  . hydrALAZINE  25 mg Oral Q8H  . isosorbide mononitrate  30 mg Oral Daily  . potassium chloride  20 mEq Oral BID  . sacubitril-valsartan  1 tablet Oral BID   Continuous Infusions: PRN Meds: acetaminophen, ondansetron (ZOFRAN) IV   Vital Signs          Vitals:   11/26/17 2022 11/26/17 2143 11/27/17 0525 11/27/17 0800  BP: 140/79 (!) 153/65 (!) 168/90 (!) 165/89  Pulse: 77 77 72 79  Resp: 20  16 18   Temp: 98.2 F (36.8 C)  98.2 F (36.8 C) 98.1 F (36.7 C)  TempSrc: Oral  Oral Oral  SpO2: 95%  92% 98%  Weight:   182 lb 1.6 oz (82.6 kg)   Height:        Intake/Output Summary (Last 24 hours) at 11/27/2017 0926 Last data filed at 11/27/2017 2993    Gross per 24 hour  Intake 480 ml  Output 3050 ml  Net -2570 ml        Filed Weights   11/25/17 0658 11/26/17 0601 11/27/17 0525  Weight: 197 lb 8 oz (89.6 kg) 187 lb 8 oz (85 kg) 182 lb 1.6  oz (82.6 kg)    Telemetry    Sinus rhythm, no adverse arrhythmias- Personally Reviewed  ECG    No new EKG- Personally Reviewed  Physical Exam   GEN:No acute distress.   Neck:No JVD Cardiac:RRR, no murmurs, rubs, or gallops.  Respiratory:Clear to auscultation bilaterally. FI:EPPI, nontender, non-distended  MS:  Minimal lower extremity edema, left greater than right; No deformity. Neuro:Nonfocal  Psych: Normal affect   Labs    Chemistry LastLabs        Recent Labs  Lab 11/24/17 1900 11/25/17 0633 11/26/17 0623 11/27/17 0548  NA 140 137 137 139  K 3.5 3.6 3.6 3.5  CL 109 102 99* 98*  CO2 21* 23 27 29   GLUCOSE 96 105* 147* 132*  BUN 18 18 23* 22*  CREATININE 1.59* 1.67* 1.77* 1.73*  CALCIUM 9.1 9.2 9.4 9.3  PROT 9.6*  --   --   --   ALBUMIN 3.1*  --   --   --   AST 20  --   --   --   ALT 13*  --   --   --   ALKPHOS 140*  --   --   --   BILITOT 3.4*  --   --   --   GFRNONAA 45* 42* 39* 41*  GFRAA 52* 49* 46* 47*  ANIONGAP 10 12 11 12        Hematology LastLabs     Recent Labs  Lab 11/24/17 1900  WBC 5.6  RBC 4.77  HGB 12.9*  HCT 39.4  MCV 82.6  MCH 27.0  MCHC 32.7  RDW 20.4*  PLT 168      Cardiac Enzymes LastLabs       Recent Labs  Lab 11/24/17 1900 11/25/17 0030 11/25/17 0633  TROPONINI <0.03 <0.03 <0.03      LastLabs  No results for input(s): TROPIPOC in the last 168 hours.     BNP LastLabs     Recent Labs  Lab 11/24/17 1900  BNP 914.8*       DDimer  LastLabs  No results for input(s): DDIMER in the last 168 hours.     Radiology    ImagingResults(Last48hours)  No results found.    Cardiac Studies   Prior EF 15 to 20% in 2006 EF 50 to 55%, pulmonary artery pressure 36 mmHg  Patient Profile     62 y.o. male here with hypertensive urgency, acute diastolic heart failure, chronic kidney disease.  Assessment & Plan    Acute systolic and diastolic heart failure -EF on this admission 50 to 55%, has improved.  Previously quite weak at 20%.  Good overall diuresis.  Watching creatinine, baseline 1.5.  With initiation of Entresto, expect increase.  Also expect mild increase with aggressive diuresis.  It is slightly decreased today down to 1.73.  Thankfully Delene Loll will not cost him anything according to his  insurance. - Weight today 182 pounds.  Down from 207 pounds. - Upon clinic follow-up, I would increase his Entresto dose especially if his creatinine remains fairly stable.  -We will discharge him on Lasix 40 mg once a day with potassium 20 mEq once a day.  I discussed with him that if his weight increases 2 to 3 pounds in 1 day, he may take an extra Lasix.  Daily weights have been stressed.  Kidney disease stage III -Baseline creatinine 1.5  CAD status post CABG -No angina, troponin negative continue with aggressive secondary prevention with aspirin statin.  NSVT -20  beat run 2 days ago.  Potassium repleted.  He is on beta-blocker.  No further issues on telemetry.  Hypertensive urgency -Improved blood pressures after administration of medications as listed above.  If BP remains high after increasing Entresto, consider addition of spironolactone.  Secondary pulmonary hypertension -Very mild to 36 mmHg.  He would like his medication sent to [facility] [place], [place] [address].  Telephone number is [phone]  Please have follow-up with Truitt Merle in 1 to 2 weeks, TOC.  Check basic metabolic profile at that time.  For questions or updates, please contact [place] Please consult [email or web address] for contact info under Cardiology/STEMI.      Signed, Candee Furbish, MD

## 2017-11-27 NOTE — Progress Notes (Signed)
Entresto coupon card given to patient with explanation of usage; Aneta Mins [phone]

## 2017-11-27 NOTE — Progress Notes (Addendum)
Progress Note  Patient Name: [name] Date of Encounter: 11/27/2017  Primary Cardiologist: Peter Martinique, MD   Subjective   Overall feeling much better.  No shortness of breath.  No chest pain.  Leg edema has reduced.  Wife in room.  He works for [employer], walks around quite a bit at [employer].  Inpatient Medications    Scheduled Meds: . aspirin EC  81 mg Oral Daily  . atorvastatin  80 mg Oral q1800  . carvedilol  25 mg Oral BID WC  . furosemide  80 mg Intravenous BID  . heparin  5,000 Units Subcutaneous Q8H  . hydrALAZINE  25 mg Oral Q8H  . isosorbide mononitrate  30 mg Oral Daily  . potassium chloride  20 mEq Oral BID  . sacubitril-valsartan  1 tablet Oral BID   Continuous Infusions:  PRN Meds: acetaminophen, ondansetron (ZOFRAN) IV   Vital Signs    Vitals:   11/26/17 2022 11/26/17 2143 11/27/17 0525 11/27/17 0800  BP: 140/79 (!) 153/65 (!) 168/90 (!) 165/89  Pulse: 77 77 72 79  Resp: 20  16 18   Temp: 98.2 F (36.8 C)  98.2 F (36.8 C) 98.1 F (36.7 C)  TempSrc: Oral  Oral Oral  SpO2: 95%  92% 98%  Weight:   182 lb 1.6 oz (82.6 kg)   Height:        Intake/Output Summary (Last 24 hours) at 11/27/2017 0926 Last data filed at 11/27/2017 7048 Gross per 24 hour  Intake 480 ml  Output 3050 ml  Net -2570 ml   Filed Weights   11/25/17 0658 11/26/17 0601 11/27/17 0525  Weight: 197 lb 8 oz (89.6 kg) 187 lb 8 oz (85 kg) 182 lb 1.6 oz (82.6 kg)    Telemetry    Sinus rhythm, no adverse arrhythmias- Personally Reviewed  ECG    No new EKG- Personally Reviewed  Physical Exam   GEN: No acute distress.   Neck: No JVD Cardiac: RRR, no murmurs, rubs, or gallops.  Respiratory: Clear to auscultation bilaterally. GI: Soft, nontender, non-distended  MS:  Minimal lower extremity edema, left greater than right; No deformity. Neuro:  Nonfocal  Psych: Normal affect   Labs    Chemistry Recent Labs  Lab 11/24/17 1900 11/25/17 0633  11/26/17 0623 11/27/17 0548  NA 140 137 137 139  K 3.5 3.6 3.6 3.5  CL 109 102 99* 98*  CO2 21* 23 27 29   GLUCOSE 96 105* 147* 132*  BUN 18 18 23* 22*  CREATININE 1.59* 1.67* 1.77* 1.73*  CALCIUM 9.1 9.2 9.4 9.3  PROT 9.6*  --   --   --   ALBUMIN 3.1*  --   --   --   AST 20  --   --   --   ALT 13*  --   --   --   ALKPHOS 140*  --   --   --   BILITOT 3.4*  --   --   --   GFRNONAA 45* 42* 39* 41*  GFRAA 52* 49* 46* 47*  ANIONGAP 10 12 11 12      Hematology Recent Labs  Lab 11/24/17 1900  WBC 5.6  RBC 4.77  HGB 12.9*  HCT 39.4  MCV 82.6  MCH 27.0  MCHC 32.7  RDW 20.4*  PLT 168    Cardiac Enzymes Recent Labs  Lab 11/24/17 1900 11/25/17 0030 11/25/17 0633  TROPONINI <0.03 <0.03 <0.03   No results for input(s): TROPIPOC  in the last 168 hours.   BNP Recent Labs  Lab 11/24/17 1900  BNP 914.8*     DDimer No results for input(s): DDIMER in the last 168 hours.   Radiology    No results found.  Cardiac Studies   Prior EF 15 to 20% in 2006 EF 50 to 55%, pulmonary artery pressure 36 mmHg  Patient Profile     62 y.o. male here with hypertensive urgency, acute diastolic heart failure, chronic kidney disease.  Assessment & Plan    Acute systolic and diastolic heart failure -EF on this admission 50 to 55%, has improved.  Previously quite weak at 20%.  Good overall diuresis.  Watching creatinine, baseline 1.5.  With initiation of Entresto, expect increase.  Also expect mild increase with aggressive diuresis.  It is slightly decreased today down to 1.73.  Thankfully Delene Loll will not cost him anything according to his insurance. - Weight today 182 pounds.  Down from 207 pounds. - Upon clinic follow-up, I would increase his Entresto dose especially if his creatinine remains fairly stable.  -We will discharge him on Lasix 40 mg once a day with potassium 20 mEq once a day.  I discussed with him that if his weight increases 2 to 3 pounds in 1 day, he may take an extra  Lasix.  Daily weights have been stressed.  Kidney disease stage III -Baseline creatinine 1.5  CAD status post CABG -No angina, troponin negative continue with aggressive secondary prevention with aspirin statin.  NSVT -20 beat run 2 days ago.  Potassium repleted.  He is on beta-blocker.  No further issues on telemetry.  Hypertensive urgency -Improved blood pressures after administration of medications as listed above.  If BP remains high after increasing Entresto, consider addition of spironolactone.  Secondary pulmonary hypertension -Very mild to 36 mmHg.  He would like his medication sent to [facility] [place], [place] [address].  Telephone number is [phone]  Please have follow-up with Truitt Merle in 1 to 2 weeks, TOC.  Check basic metabolic profile at that time.  For questions or updates, please contact [place] Please consult [email or web address] for contact info under Cardiology/STEMI.      Signed, Candee Furbish, MD  11/27/2017, 9:26 AM

## 2017-11-27 NOTE — Progress Notes (Signed)
Pt discharged home, IV and tele removed. Discharge instructions given, Questions answered.

## 2017-11-28 ENCOUNTER — Telehealth: Payer: Self-pay | Admitting: Physician Assistant

## 2017-11-28 MED ORDER — POTASSIUM CHLORIDE CRYS ER 20 MEQ PO TBCR: 20.0000 meq | EXTENDED_RELEASE_TABLET | Freq: Every day | ORAL | 6 refills | Status: DC

## 2017-11-28 MED ORDER — FUROSEMIDE 40 MG PO TABS: 40.0000 mg | ORAL_TABLET | Freq: Every day | ORAL | 6 refills | Status: DC

## 2017-11-28 MED ORDER — ATORVASTATIN CALCIUM 80 MG PO TABS: 80.0000 mg | ORAL_TABLET | Freq: Every day | ORAL | 3 refills | Status: DC

## 2017-11-28 NOTE — Telephone Encounter (Signed)
Paged called for Rx. Send lasix, Kdur and lipitor to local pharmacy.

## 2017-12-02 ENCOUNTER — Other Ambulatory Visit: Payer: Self-pay | Admitting: Nurse Practitioner

## 2017-12-03 ENCOUNTER — Other Ambulatory Visit: Payer: Self-pay | Admitting: Nurse Practitioner

## 2017-12-03 MED ORDER — HYDRALAZINE HCL 25 MG PO TABS: 25.0000 mg | ORAL_TABLET | Freq: Three times a day (TID) | ORAL | 3 refills | Status: DC

## 2017-12-07 ENCOUNTER — Ambulatory Visit: Payer: PRIVATE HEALTH INSURANCE | Admitting: Adult Health

## 2017-12-14 ENCOUNTER — Encounter: Payer: Self-pay | Admitting: Physician Assistant

## 2017-12-14 ENCOUNTER — Ambulatory Visit (INDEPENDENT_AMBULATORY_CARE_PROVIDER_SITE_OTHER): Payer: PRIVATE HEALTH INSURANCE | Admitting: Physician Assistant

## 2017-12-14 VITALS — BP 140/72 | HR 83 | Ht 69.5 in | Wt 180.0 lb

## 2017-12-14 DIAGNOSIS — I10 Essential (primary) hypertension: Secondary | ICD-10-CM

## 2017-12-14 DIAGNOSIS — I5032 Chronic diastolic (congestive) heart failure: Secondary | ICD-10-CM | POA: Diagnosis not present

## 2017-12-14 DIAGNOSIS — E785 Hyperlipidemia, unspecified: Secondary | ICD-10-CM | POA: Diagnosis not present

## 2017-12-14 DIAGNOSIS — I25810 Atherosclerosis of coronary artery bypass graft(s) without angina pectoris: Secondary | ICD-10-CM | POA: Diagnosis not present

## 2017-12-14 MED ORDER — POTASSIUM CHLORIDE CRYS ER 20 MEQ PO TBCR: 20.0000 meq | EXTENDED_RELEASE_TABLET | Freq: Every day | ORAL | 6 refills | Status: DC

## 2017-12-14 MED ORDER — SACUBITRIL-VALSARTAN 49-51 MG PO TABS: 1.0000 | ORAL_TABLET | Freq: Two times a day (BID) | ORAL | 6 refills | Status: DC

## 2017-12-14 NOTE — Patient Instructions (Addendum)
Medication Instructions:  INCREASE Entresto to 49/51mg  take 1 tablet bid   Labwork: Your physician recommends that you return for lab work in: TODAY-BMET   Testing/Procedures: None   Follow-Up: Your physician recommends that you schedule a follow-up appointment in: 2 months with [name] ONLY  Any Other Special Instructions Will Be Listed Below (If Applicable).  If you need a refill on your cardiac medications before your next appointment, please call your pharmacy.

## 2017-12-14 NOTE — Progress Notes (Signed)
Cardiology Office Note    Date:  12/14/2017   ID:  Shannan Garfinkel, DOB 1955/11/15, MRN [id number]  PCP:  Martinique, Peter M, MD  Cardiologist:  Truitt Merle, Dr. Martinique   Chief Complaint  Patient presents with  . Hospitalization Follow-up    denies chest pains, SOB, denies swelling in hands/feet    History of Present Illness:  [name] is a 62 y.o. male with PMH of CAD s/p CABG 2002, HTN, dilated cardiomyopathy, hyperlipidemia and history of noncompliance with follow-up.  Echocardiogram in 2009 showed significant improvement in his LV function and EF was normal.  Cardiac catheterization in 2006 demonstrated disease that is not a candidate for further revascularization.  Remote CTA abdomen showed 50% left renal artery stenosis in 2006 as well.  Patient was recently seen by Truitt Merle, NP on 11/24/2017 for worsening edema.  His blood pressure was very high as well.  During the hospitalization, he was started on Entresto, his carvedilol was increased to 12.5 mg twice daily.  He was aggressively diuresed with 80 mg twice daily of Lasix.  Echocardiogram obtained on 11/25/2017 showed EF 50 to 55%, grade 1 DD, moderate LVH, moderately dilated left atrium, severely dilated right atrium, peak PA pressure 36 mmHg.  His discharge weight was 182 pounds which was down from 207 pounds.  He was eventually discharged on 40 mg daily of Lasix with 20 mEq of potassium daily.  He was instructed to take additional dose of Lasix on a as needed basis if weight increase by more than 2 to 3 pounds per day.   Patient presents today for cardiology office visit.  His blood pressure remain borderline elevated, his breathing has normalized.  He does not have any lower extremity edema, orthopnea or PND.  He says he has been compliant with his medications since discharge.  He is weight today is slightly lower than the discharge weight.  I will obtain a basic metabolic panel.  He says upon discharge, he never received a  prescription for his potassium, therefore has not been taking it.  I will increase his Entresto to 49-51 mg twice daily.  Otherwise he denies any recent chest discomfort.  He can follow-up with Truitt Merle in a month.   Past Medical History:  Diagnosis Date  . Arthritis    "left knee" (11/24/2017)  . CAD (coronary artery disease)    CABG 2002  . CHF (congestive heart failure) ([place])   . Chronic renal insufficiency   . Dilated cardiomyopathy ([place])    echo in 2009 showed improvement with a normal EF  . HTN (hypertension)   . Hyperlipemia   . Noncompliance   . PAD (peripheral artery disease) ([place])     Past Surgical History:  Procedure Laterality Date  . CARDIAC CATHETERIZATION  2002, 2006  . CORONARY ARTERY BYPASS GRAFT  2002   x4 DR. VAN TRIGT    Current Medications: Outpatient Medications Prior to Visit  Medication Sig Dispense Refill  . aspirin 325 MG EC tablet Take 81 mg by mouth daily.     [name] atorvastatin (LIPITOR) 80 MG tablet Take 1 tablet (80 mg total) by mouth daily. 90 tablet 3  . carvedilol (COREG) 25 MG tablet TAKE 1 TABLE BY MOUTH 2 TIMES DAILY WITH A MEAL. 60 tablet 9  . furosemide (LASIX) 40 MG tablet Take 1 tablet (40 mg total) by mouth daily. 30 tablet 6  . hydrALAZINE (APRESOLINE) 25 MG tablet Take 1 tablet (25 mg total) by mouth  every 8 (eight) hours. 270 tablet 3  . ibuprofen (ADVIL,MOTRIN) 200 MG tablet Take 200 mg by mouth every 6 (six) hours as needed for moderate pain (for knee).    . isosorbide mononitrate (IMDUR) 30 MG 24 hr tablet Take 1 tablet (30 mg total) by mouth daily. 90 tablet 3  . sacubitril-valsartan (ENTRESTO) 24-26 MG Take 1 tablet by mouth 2 (two) times daily. 60 tablet 6  . potassium chloride SA (K-DUR,KLOR-CON) 20 MEQ tablet Take 1 tablet (20 mEq total) by mouth daily. (Patient not taking: Reported on 12/14/2017) 60 tablet 6   No facility-administered medications prior to visit.      Allergies:   Patient has no known allergies.   Social  History   Socioeconomic History  . Marital status: Married    Spouse name: Not on file  . Number of children: 1  . Years of education: Not on file  . Highest education level: Not on file  Occupational History  . Occupation: Furniture conservator/restorer  Social Needs  . Financial resource strain: Not on file  . Food insecurity:    Worry: Not on file    Inability: Not on file  . Transportation needs:    Medical: Not on file    Non-medical: Not on file  Tobacco Use  . Smoking status: Never Smoker  . Smokeless tobacco: Never Used  Substance and Sexual Activity  . Alcohol use: No  . Drug use: No  . Sexual activity: Yes  Lifestyle  . Physical activity:    Days per week: Not on file    Minutes per session: Not on file  . Stress: Not on file  Relationships  . Social connections:    Talks on phone: Not on file    Gets together: Not on file    Attends religious service: Not on file    Active member of club or organization: Not on file    Attends meetings of clubs or organizations: Not on file    Relationship status: Not on file  Other Topics Concern  . Not on file  Social History Narrative  . Not on file     Family History:  The patient's family history includes Diabetes in his mother; Hypertension in his father.   ROS:   Please see the history of present illness.    ROS All other systems reviewed and are negative.   PHYSICAL EXAM:   VS:  BP 140/72 (BP Location: Left Arm)   Pulse 83   Ht 5' 9.5" (1.765 m)   Wt 180 lb (81.6 kg)   BMI 26.20 kg/m    GEN: Well nourished, well developed, in no acute distress  HEENT: normal  Neck: no JVD, carotid bruits, or masses Cardiac: RRR; no murmurs, rubs, or gallops,no edema  Respiratory:  clear to auscultation bilaterally, normal work of breathing GI: soft, nontender, nondistended, + BS MS: no deformity or atrophy  Skin: warm and dry, no rash Neuro:  Alert and Oriented x 3, Strength and sensation are intact Psych: euthymic mood, full  affect  Wt Readings from Last 3 Encounters:  12/14/17 180 lb (81.6 kg)  11/27/17 182 lb 1.6 oz (82.6 kg)  11/24/17 212 lb 6.4 oz (96.3 kg)      Studies/Labs Reviewed:   EKG:  EKG is not ordered today.   Recent Labs: 11/24/2017: ALT 13; B Natriuretic Peptide 914.8; Hemoglobin 12.9; Platelets 168; TSH 0.510 11/26/2017: Magnesium 1.4 11/27/2017: BUN 22; Creatinine, Ser 1.73; Potassium 3.5; Sodium 139  Lipid Panel    Component Value Date/Time   CHOL 169 10/19/2016 1527   TRIG 119 10/19/2016 1527   HDL 37 (L) 10/19/2016 1527   CHOLHDL 4.6 10/19/2016 1527   CHOLHDL 4.2 02/11/2016 0934   VLDL 24 02/11/2016 0934   LDLCALC 108 (H) 10/19/2016 1527   LDLDIRECT 149.4 01/07/2011 1616    Additional studies/ records that were reviewed today include:   Echo 11/25/2017 LV EF: 50% -   55% Study Conclusions  - Left ventricle: The cavity size was normal. There was moderate   concentric hypertrophy. Systolic function was normal. The   estimated ejection fraction was in the range of 50% to 55%. Wall   motion was normal; there were no regional wall motion   abnormalities. Doppler parameters are consistent with abnormal   left ventricular relaxation (grade 1 diastolic dysfunction).   Doppler parameters are consistent with high ventricular filling   pressure. - Aortic valve: Transvalvular velocity was within the normal range.   There was no stenosis. There was no regurgitation. - Mitral valve: Transvalvular velocity was within the normal range.   There was no evidence for stenosis. There was no regurgitation. - Left atrium: The atrium was moderately dilated. - Right ventricle: The cavity size was normal. Wall thickness was   normal. Systolic function was normal. - Right atrium: The atrium was severely dilated. - Atrial septum: No defect or patent foramen ovale was identified   by color flow Doppler. - Tricuspid valve: There was trivial regurgitation. - Pulmonary arteries: Systolic  pressure was within the normal   range. PA peak pressure: 36 mm Hg (S).    ASSESSMENT:    1. Chronic diastolic heart failure ([place])   2. Coronary artery disease involving coronary bypass graft of native heart without angina pectoris   3. Essential hypertension   4. Hyperlipidemia, unspecified hyperlipidemia type      PLAN:  In order of problems listed above:  1. Chronic diastolic heart failure: Appears to be be euvolemic on 40 mg daily of Lasix, however he says he never got the prescription for the potassium supplement.  I have refilled the potassium supplement.  He will need a basic metabolic panel today.  If potassium level is low, will need repeat blood work in 1 to 2 weeks.  2. CAD s/p CABG: Denies any recent anginal symptom.  EF 50 to 55% on recent echocardiogram  3. Hypertension: Blood pressure borderline elevated, will increase Entresto to 49-51 mg twice daily  4. Hyperlipidemia: On Lipitor 80 mg daily.  Last lipid panel is in April 2019, due to have repeat lipid panel this year.    Medication Adjustments/Labs and Tests Ordered: Current medicines are reviewed at length with the patient today.  Concerns regarding medicines are outlined above.  Medication changes, Labs and Tests ordered today are listed in the Patient Instructions below. Patient Instructions  Medication Instructions:  INCREASE Entresto to 49/51mg  take 1 tablet bid   Labwork: Your physician recommends that you return for lab work in: TODAY-BMET   Testing/Procedures: None   Follow-Up: Your physician recommends that you schedule a follow-up appointment in: 2 months with Truitt Merle ONLY  Any Other Special Instructions Will Be Listed Below (If Applicable).  If you need a refill on your cardiac medications before your next appointment, please call your pharmacy.     Hilbert Corrigan, [place]  12/14/2017 5:04 PM    [facility] [place], [place], [place]  [address] Phone: [phone]  [phone]; Fax: [phone]

## 2018-01-11 ENCOUNTER — Encounter: Payer: Self-pay | Admitting: Nurse Practitioner

## 2018-01-11 ENCOUNTER — Ambulatory Visit (INDEPENDENT_AMBULATORY_CARE_PROVIDER_SITE_OTHER): Payer: PRIVATE HEALTH INSURANCE | Admitting: Nurse Practitioner

## 2018-01-11 VITALS — BP 158/80 | HR 72 | Ht 69.5 in | Wt 179.8 lb

## 2018-01-11 DIAGNOSIS — I5032 Chronic diastolic (congestive) heart failure: Secondary | ICD-10-CM | POA: Diagnosis not present

## 2018-01-11 LAB — BASIC METABOLIC PANEL
BUN/Creatinine Ratio: 22 (ref 10–24)
BUN: 33 mg/dL — ABNORMAL HIGH (ref 8–27)
CO2: 21 mmol/L (ref 20–29)
Calcium: 9.6 mg/dL (ref 8.6–10.2)
Chloride: 100 mmol/L (ref 96–106)
Creatinine, Ser: 1.5 mg/dL — ABNORMAL HIGH (ref 0.76–1.27)
GFR calc Af Amer: 57 mL/min/{1.73_m2} — ABNORMAL LOW (ref >59)
GFR calc non Af Amer: 49 mL/min/{1.73_m2} — ABNORMAL LOW (ref >59)
Glucose: 225 mg/dL — ABNORMAL HIGH (ref 65–99)
Potassium: 4.1 mmol/L (ref 3.5–5.2)
Sodium: 135 mmol/L (ref 134–144)

## 2018-01-11 LAB — PRO B NATRIURETIC PEPTIDE: NT-Pro BNP: 147 pg/mL (ref 0–210)

## 2018-01-11 MED ORDER — HYDRALAZINE HCL 50 MG PO TABS: 50.0000 mg | ORAL_TABLET | Freq: Three times a day (TID) | ORAL | 3 refills | Status: DC

## 2018-01-11 NOTE — Progress Notes (Signed)
CARDIOLOGY OFFICE NOTE  Date:  01/11/2018    [name] Date of Birth: [birth year] Medical Record #[id number]  PCP:  Patient, No Pcp Per  Cardiologist:  [name] & [name]   Chief Complaint  Patient presents with  . Hypertension  . Congestive Heart Failure    1 month check - seen for Dr. Martinique    History of Present Illness: [name] is a 62 y.o. male who presents today for a one month check. Seen for Dr. Martinique. He typically follows with me.   He has a history of CAD s/p CABG 2002, HTN, dilated cardiomyopathy, hyperlipidemia and history of noncompliance with follow-up.  Echocardiogram in 2009 showed significant improvement in his LV function and EF was normal.  Cardiac catheterization in 2006 demonstrated disease but he was not a candidate for further revascularization. Remote CTA abdomen showed 50% left renal artery stenosis in 2006 as well.  Follow up renal duplex was ok.   I last saw him about 6 weeks ago - BP sky high. He was in heart failure. He was not taking his medicines correctly - I met his wife who was very much unaware of what [name] was doing - he was telling her a totally different story that was not correct.   He was admitted at that time. He was started on Entresto, his carvedilol was increased to 12.5 mg twice daily.  He was aggressively diuresed with 80 mg twice daily of Lasix.  Echocardiogram obtained on 11/25/2017 showed EF 50 to 55%, grade 1 DD, moderate LVH, moderately dilated left atrium, severely dilated right atrium, peak PA pressure 36 mmHg.  His discharge weight was 182 pounds which was down from 207 pounds.  He was eventually discharged on 40 mg daily of Lasix with 20 mEq of potassium daily.  He was instructed to take additional dose of Lasix on a as needed basis if weight increase by more than 2 to 3 pounds per day.   Was seen by [name] a month ago - BP borderline. Had not been given his potassium. Entresto was increased.   Comes in today. Here  alone. He says he is doing ok. He tells me he went to the pharmacy to get his potassium - unfortunately, this RX was "No print" and was not actually given to him or e-prescribed - thus still NOT taking. He is doing well. Weight is down a little more. He "feels great". Not short of breath. No chest pain. No swelling. He is quite happy that he is feeling good. He typically takes his Lasix at 4pm - too much urinary frequency and interferes with his work. BP in the 150 to 160's at home.   Past Medical History:  Diagnosis Date  . Arthritis    "left knee" (11/24/2017)  . CAD (coronary artery disease)    CABG 2002  . CHF (congestive heart failure) ([place])   . Chronic renal insufficiency   . Dilated cardiomyopathy ([place])    echo in 2009 showed improvement with a normal EF  . HTN (hypertension)   . Hyperlipemia   . Noncompliance   . PAD (peripheral artery disease) ([place])     Past Surgical History:  Procedure Laterality Date  . CARDIAC CATHETERIZATION  2002, 2006  . CORONARY ARTERY BYPASS GRAFT  2002   x4 DR. VAN TRIGT     Medications: Current Meds  Medication Sig  . aspirin 325 MG EC tablet Take 81 mg by mouth daily.   [name] atorvastatin (  LIPITOR) 80 MG tablet Take 1 tablet (80 mg total) by mouth daily.  . carvedilol (COREG) 25 MG tablet TAKE 1 TABLE BY MOUTH 2 TIMES DAILY WITH A MEAL.  . furosemide (LASIX) 40 MG tablet Take 1 tablet (40 mg total) by mouth daily.  [name] ibuprofen (ADVIL,MOTRIN) 200 MG tablet Take 200 mg by mouth every 6 (six) hours as needed for moderate pain (for knee).  . isosorbide mononitrate (IMDUR) 30 MG 24 hr tablet Take 1 tablet (30 mg total) by mouth daily.  . sacubitril-valsartan (ENTRESTO) 49-51 MG Take 1 tablet by mouth 2 (two) times daily.  . [DISCONTINUED] hydrALAZINE (APRESOLINE) 25 MG tablet Take 1 tablet (25 mg total) by mouth every 8 (eight) hours.  . [DISCONTINUED] potassium chloride SA (K-DUR,KLOR-CON) 20 MEQ tablet Take 1 tablet (20 mEq total) by mouth daily.      Allergies: No Known Allergies  Social History: The patient  reports that he has never smoked. He has never used smokeless tobacco. He reports that he does not drink alcohol or use drugs.   Family History: The patient's family history includes Diabetes in his mother; Hypertension in his father.   Review of Systems: Please see the history of present illness.   Otherwise, the review of systems is positive for none.   All other systems are reviewed and negative.   Physical Exam: VS:  BP (!) 158/80 (BP Location: Left Arm, Patient Position: Sitting, Cuff Size: Normal)   Pulse 72   Ht 5' 9.5" (1.765 m)   Wt 179 lb 12.8 oz (81.6 kg)   SpO2 99% Comment: at rest  BMI 26.17 kg/m  .  BMI Body mass index is 26.17 kg/m.  Wt Readings from Last 3 Encounters:  01/11/18 179 lb 12.8 oz (81.6 kg)  12/14/17 180 lb (81.6 kg)  11/27/17 182 lb 1.6 oz (82.6 kg)    General: Pleasant. Alert and in no acute distress.   HEENT: Normal.  Neck: Supple, no JVD, carotid bruits, or masses noted.  Cardiac: Regular rate and rhythm. No murmurs, rubs, or gallops. No edema.  Respiratory:  Lungs are clear to auscultation bilaterally with normal work of breathing.  GI: Soft and nontender.  MS: No deformity or atrophy. Gait and ROM intact.  Skin: Warm and dry. Color is normal.  Neuro:  Strength and sensation are intact and no gross focal deficits noted.  Psych: Alert, appropriate and with normal affect.   LABORATORY DATA:  EKG:  EKG is not ordered today.   Lab Results  Component Value Date   WBC 5.6 11/24/2017   HGB 12.9 (L) 11/24/2017   HCT 39.4 11/24/2017   PLT 168 11/24/2017   GLUCOSE 132 (H) 11/27/2017   CHOL 169 10/19/2016   TRIG 119 10/19/2016   HDL 37 (L) 10/19/2016   LDLDIRECT 149.4 01/07/2011   LDLCALC 108 (H) 10/19/2016   ALT 13 (L) 11/24/2017   AST 20 11/24/2017   NA 139 11/27/2017   K 3.5 11/27/2017   CL 98 (L) 11/27/2017   CREATININE 1.73 (H) 11/27/2017   BUN 22 (H) 11/27/2017    CO2 29 11/27/2017   TSH 0.510 11/24/2017   INR 1.37 11/24/2017   HGBA1C 8.8 (H) 11/09/2016       BNP (last 3 results) Recent Labs    11/24/17 1900  BNP 914.8*    ProBNP (last 3 results) No results for input(s): PROBNP in the last 8760 hours.   Other Studies Reviewed Today:  Echo 11/25/2017 LV EF: 50% -  55% Study Conclusions  - Left ventricle: The cavity size was normal. There was moderate concentric hypertrophy. Systolic function was normal. The estimated ejection fraction was in the range of 50% to 55%. Wall motion was normal; there were no regional wall motion abnormalities. Doppler parameters are consistent with abnormal left ventricular relaxation (grade 1 diastolic dysfunction). Doppler parameters are consistent with high ventricular filling pressure. - Aortic valve: Transvalvular velocity was within the normal range. There was no stenosis. There was no regurgitation. - Mitral valve: Transvalvular velocity was within the normal range. There was no evidence for stenosis. There was no regurgitation. - Left atrium: The atrium was moderately dilated. - Right ventricle: The cavity size was normal. Wall thickness was normal. Systolic function was normal. - Right atrium: The atrium was severely dilated. - Atrial septum: No defect or patent foramen ovale was identified by color flow Doppler. - Tricuspid valve: There was trivial regurgitation. - Pulmonary arteries: Systolic pressure was within the normal range. PA peak pressure: 36 mm Hg (S).   Assessment & Plan:  1. Chronic diastolic HF - he looks good. NYHA I. Needs labs today. Has not had any potassium since discharge back in May - recheck lab today - will then decide if he needs supplementation. He looks incredibly better. EF 50 to 55% on recent echocardiogram  2. CAD - no active chest pain - would favor continued medical management  3. HTN - BP improved - will increase the  Hydralazine to 50 mg TID.   4. Non compliance - seems to be on track - hopefully he will be able to sustain.   5. HLD - on statin therapy.    Current medicines are reviewed with the patient today.  The patient does not have concerns regarding medicines other than what has been noted above.  The following changes have been made:  See above.  Labs/ tests ordered today include:    Orders Placed This Encounter  Procedures  . Basic metabolic panel  . Pro b natriuretic peptide (BNP)     Disposition:   FU with me in 2 months.   Patient is agreeable to this plan and will call if any problems develop in the interim.   SignedTruitt Merle, NP  01/11/2018 8:58 AM  [facility] [address] [place], [place]  [address] Phone: [phone] Fax: [phone]

## 2018-01-11 NOTE — Patient Instructions (Addendum)
We will be checking the following labs today - BMET and BNP   Medication Instructions:    Continue with your current medicines. BUT  We will check your labs and see if you actually need to be on the potassium  Increase the Hydralazine to 50 mg three times a day - put the 25 mg bottle away - the RX for the 50 mg is at the drug store.     Testing/Procedures To Be Arranged:  N/A  Follow-Up:   See me in 2 months    Other Special Instructions:   N/A    If you need a refill on your cardiac medications before your next appointment, please call your pharmacy.   Call the [place] office at [phone] if you have any questions, problems or concerns.

## 2018-03-23 ENCOUNTER — Ambulatory Visit: Payer: PRIVATE HEALTH INSURANCE | Admitting: Nurse Practitioner

## 2018-04-12 ENCOUNTER — Other Ambulatory Visit: Payer: Self-pay | Admitting: *Deleted

## 2018-04-12 ENCOUNTER — Ambulatory Visit (INDEPENDENT_AMBULATORY_CARE_PROVIDER_SITE_OTHER): Payer: PRIVATE HEALTH INSURANCE | Admitting: Nurse Practitioner

## 2018-04-12 ENCOUNTER — Encounter: Payer: Self-pay | Admitting: Nurse Practitioner

## 2018-04-12 VITALS — BP 140/70 | HR 75 | Ht 69.5 in | Wt 178.8 lb

## 2018-04-12 DIAGNOSIS — Z9119 Patient's noncompliance with other medical treatment and regimen: Secondary | ICD-10-CM

## 2018-04-12 DIAGNOSIS — I25810 Atherosclerosis of coronary artery bypass graft(s) without angina pectoris: Secondary | ICD-10-CM | POA: Diagnosis not present

## 2018-04-12 DIAGNOSIS — I5032 Chronic diastolic (congestive) heart failure: Secondary | ICD-10-CM

## 2018-04-12 DIAGNOSIS — R7309 Other abnormal glucose: Secondary | ICD-10-CM

## 2018-04-12 DIAGNOSIS — Z91199 Patient's noncompliance with other medical treatment and regimen due to unspecified reason: Secondary | ICD-10-CM

## 2018-04-12 DIAGNOSIS — I10 Essential (primary) hypertension: Secondary | ICD-10-CM | POA: Diagnosis not present

## 2018-04-12 NOTE — Patient Instructions (Addendum)
We will be checking the following labs today - BMET   Medication Instructions:    Continue with your current medicines.     Testing/Procedures To Be Arranged:  N/A  Follow-Up:   See me in 4 months  I have placed a referral to see a doctor about your diabetes - Dr. [name]   Please find out who your wife sees for a primary care doctor and see if you can go there    Other Special Instructions:   N/A    If you need a refill on your cardiac medications before your next appointment, please call your pharmacy.   Call the [place] office at [phone] if you have any questions, problems or concerns.

## 2018-04-12 NOTE — Progress Notes (Signed)
CARDIOLOGY OFFICE NOTE  Date:  04/12/2018    [name] Date of Birth: [birth year] Medical Record #[id number]  PCP:  Patient, No Pcp Per  Cardiologist:  Servando Snare & [place]    Chief Complaint  Patient presents with  . Hypertension    Follow up visit - seen for Dr. [place]    History of Present Illness: [name] is a 62 y.o. male who presents today for a follow up visit. Seen for Dr. Martin[place]. He typically follows with me.   He has a history of CAD s/p CABG 2002, HTN,dilated cardiomyopathy, hyperlipidemia and history of noncompliance with follow-up. Echocardiogram in 2009 showed significant improvement in his LV function and EF was normal. Cardiac catheterization in 2006 demonstrated disease but he was not a candidate for further revascularization. Remote CTA abdomen showed 50% left renal artery stenosis in 2006 as well. Follow up renal duplex was ok.   I saw him back in May - after a prolonged absence - BP sky high. He was in heart failure. He was not taking his medicines correctly - I met his wife who was very much unaware of what Oluwasemilore was doing - he was telling her a totally different story that was not correct. He ended up being admitted. He was started on Entresto, his carvedilol was increased to 12.5 mg twice daily. He was aggressively diuresed with 80 mg twice daily of Lasix. Echocardiogram obtained on 11/25/2017 showed EF 50 to 55%, grade 1 DD, moderate LVH, moderately dilated left atrium, severely dilated right atrium, peak PA pressure 36 mmHg. His discharge weight was 182 pounds which was down from 207 pounds. He was eventually discharged on 40 mg daily of Lasix with 20 mEq of potassium daily. He was instructed to take additional dose of Lasix on a as needed basis if weight increase by more than 2 to 3 pounds per day.   He saw [name] in June - BP borderline. Had not been given his potassium. Entresto was increased. I then saw him in July - was still off his  potassium He was feeling good. His lab show profound elevation in his glucose - most likely diabetic. He has not returned phone calls to discuss/refer.   Comes in today. Here alone. He says he feels well. No chest pain. Breathing is good. He says he is taking his medicines. He really can't give me an answer as to why he has not returned our calls to discuss his labs. He is clearly diabetic. He does not have a PCP - has not had for many years.   Past Medical History:  Diagnosis Date  . Arthritis    "left knee" (11/24/2017)  . CAD (coronary artery disease)    CABG 2002  . CHF (congestive heart failure) ([place])   . Chronic renal insufficiency   . Dilated cardiomyopathy ([place])    echo in 2009 showed improvement with a normal EF  . HTN (hypertension)   . Hyperlipemia   . Noncompliance   . PAD (peripheral artery disease) ([place])     Past Surgical History:  Procedure Laterality Date  . CARDIAC CATHETERIZATION  2002, 2006  . CORONARY ARTERY BYPASS GRAFT  2002   x4 DR. VAN TRIGT     Medications: Current Meds  Medication Sig  . aspirin 325 MG EC tablet Take 81 mg by mouth daily.   [name] atorvastatin (LIPITOR) 80 MG tablet Take 1 tablet (80 mg total) by mouth daily.  . carvedilol (COREG) 25 MG  tablet TAKE 1 TABLE BY MOUTH 2 TIMES DAILY WITH A MEAL.  . furosemide (LASIX) 40 MG tablet Take 1 tablet (40 mg total) by mouth daily.  . hydrALAZINE (APRESOLINE) 50 MG tablet Take 1 tablet (50 mg total) by mouth 3 (three) times daily.  [name] ibuprofen (ADVIL,MOTRIN) 200 MG tablet Take 200 mg by mouth every 6 (six) hours as needed for moderate pain (for knee).  . isosorbide mononitrate (IMDUR) 30 MG 24 hr tablet Take 1 tablet (30 mg total) by mouth daily.  . sacubitril-valsartan (ENTRESTO) 49-51 MG Take 1 tablet by mouth 2 (two) times daily.     Allergies: No Known Allergies  Social History: The patient  reports that he has never smoked. He has never used smokeless tobacco. He reports that he does not  drink alcohol or use drugs.   Family History: The patient's family history includes Diabetes in his mother; Hypertension in his father.   Review of Systems: Please see the history of present illness.   Otherwise, the review of systems is positive for none.   All other systems are reviewed and negative.   Physical Exam: VS:  BP 140/70 (BP Location: Left Arm, Patient Position: Sitting, Cuff Size: Normal)   Pulse 75   Ht 5' 9.5" (1.765 m)   Wt 178 lb 12.8 oz (81.1 kg)   SpO2 99% Comment: at rest  BMI 26.03 kg/m  .  BMI Body mass index is 26.03 kg/m.  Wt Readings from Last 3 Encounters:  04/12/18 178 lb 12.8 oz (81.1 kg)  01/11/18 179 lb 12.8 oz (81.6 kg)  12/14/17 180 lb (81.6 kg)   BP by me is 140/70 as well.   General: Pleasant. Well developed, well nourished and in no acute distress.   HEENT: Normal.  Neck: Supple, no JVD, carotid bruits, or masses noted.  Cardiac: Regular rate and rhythm. No murmurs, rubs, or gallops. No edema.  Respiratory:  Lungs are clear to auscultation bilaterally with normal work of breathing.  GI: Soft and nontender.  MS: No deformity or atrophy. Gait and ROM intact.  Skin: Warm and dry. Color is normal.  Neuro:  Strength and sensation are intact and no gross focal deficits noted.  Psych: Alert, appropriate and with normal affect.   LABORATORY DATA:  EKG:  EKG is not ordered today.  Lab Results  Component Value Date   WBC 5.6 11/24/2017   HGB 12.9 (L) 11/24/2017   HCT 39.4 11/24/2017   PLT 168 11/24/2017   GLUCOSE 225 (H) 01/11/2018   CHOL 169 10/19/2016   TRIG 119 10/19/2016   HDL 37 (L) 10/19/2016   LDLDIRECT 149.4 01/07/2011   LDLCALC 108 (H) 10/19/2016   ALT 13 (L) 11/24/2017   AST 20 11/24/2017   NA 135 01/11/2018   K 4.1 01/11/2018   CL 100 01/11/2018   CREATININE 1.50 (H) 01/11/2018   BUN 33 (H) 01/11/2018   CO2 21 01/11/2018   TSH 0.510 11/24/2017   INR 1.37 11/24/2017   HGBA1C 8.8 (H) 11/09/2016     BNP (last 3  results) Recent Labs    11/24/17 1900  BNP 914.8*    ProBNP (last 3 results) Recent Labs    01/11/18 0856  PROBNP 147     Other Studies Reviewed Today:  Echo 11/25/2017 LV EF: 50% - 55% Study Conclusions  - Left ventricle: The cavity size was normal. There was moderate concentric hypertrophy. Systolic function was normal. The estimated ejection fraction was in the range of  50% to 55%. Wall motion was normal; there were no regional wall motion abnormalities. Doppler parameters are consistent with abnormal left ventricular relaxation (grade 1 diastolic dysfunction). Doppler parameters are consistent with high ventricular filling pressure. - Aortic valve: Transvalvular velocity was within the normal range. There was no stenosis. There was no regurgitation. - Mitral valve: Transvalvular velocity was within the normal range. There was no evidence for stenosis. There was no regurgitation. - Left atrium: The atrium was moderately dilated. - Right ventricle: The cavity size was normal. Wall thickness was normal. Systolic function was normal. - Right atrium: The atrium was severely dilated. - Atrial septum: No defect or patent foramen ovale was identified by color flow Doppler. - Tricuspid valve: There was trivial regurgitation. - Pulmonary arteries: Systolic pressure was within the normal range. PA peak pressure: 36 mm Hg (S).   Assessment & Plan:  1. HTN - this is the best reading he has had in quite some time. Afternoon appointments do work to evaluate status. No changes made today.   2. Chronic diastolic HF - he is doing well. Weight is staying down.   3. Diabetes - referring to Endocrine - he is going to see if his wife's PCP will see him as well. I have reiterated numerous times during the visit to keep this appointment so that treatment can be started.   4. CAD - no active chest pain.   5. Long standing non compliance - seems to be  staying on track - hopefully this will continue.   6. HLD - on statin therapy.    Current medicines are reviewed with the patient today.  The patient does not have concerns regarding medicines other than what has been noted above.  The following changes have been made:  See above.  Labs/ tests ordered today include:    Orders Placed This Encounter  Procedures  . Ambulatory referral to Endocrinology     Disposition:   FU with me in 3 to 4 months. Referring to Endocrine.    Patient is agreeable to this plan and will call if any problems develop in the interim.   SignedTruitt Merle, NP  04/12/2018 4:17 PM  [facility] [address] [place], [place]  [address] Phone: [phone] Fax: [phone]

## 2018-04-13 LAB — BASIC METABOLIC PANEL
BUN/Creatinine Ratio: 20 (ref 10–24)
BUN: 26 mg/dL (ref 8–27)
CO2: 23 mmol/L (ref 20–29)
Calcium: 9.2 mg/dL (ref 8.6–10.2)
Chloride: 102 mmol/L (ref 96–106)
Creatinine, Ser: 1.32 mg/dL — ABNORMAL HIGH (ref 0.76–1.27)
GFR calc Af Amer: 66 mL/min/{1.73_m2} (ref >59)
GFR calc non Af Amer: 57 mL/min/{1.73_m2} — ABNORMAL LOW (ref >59)
Glucose: 108 mg/dL — ABNORMAL HIGH (ref 65–99)
Potassium: 4.1 mmol/L (ref 3.5–5.2)
Sodium: 139 mmol/L (ref 134–144)

## 2018-05-04 ENCOUNTER — Encounter: Payer: Self-pay | Admitting: Endocrinology

## 2018-06-20 ENCOUNTER — Telehealth: Payer: Self-pay | Admitting: Nurse Practitioner

## 2018-06-20 NOTE — Telephone Encounter (Signed)
New Message   Pt c/o medication issue:  1. Name of Medication: Enteresto  2. How are you currently taking this medication (dosage and times per day)?   3. Are you having a reaction (difficulty breathing--STAT)?  4. What is your medication issue? Patient is calling because the cost of the Delene Loll is to expensive. He would like to try something else. Please call to discuss.

## 2018-06-20 NOTE — Telephone Encounter (Signed)
Lm on pt's voice mail Will forward message to Truitt Merle NP for review and recommendations on differ med Entresto to expensive .cy

## 2018-06-21 MED ORDER — LOSARTAN POTASSIUM 100 MG PO TABS: 100.0000 mg | ORAL_TABLET | Freq: Every day | ORAL | 3 refills | Status: DC

## 2018-06-21 NOTE — Telephone Encounter (Signed)
Spoke with the patient, advised him to stop taking Entresto and start taking Losartan 100 mg daily. He expressed understanding and confirmed his follow up appointment in January.

## 2018-06-21 NOTE — Telephone Encounter (Signed)
Please call - Andee Poles is out of the office today.   Stopping Entresto due to cost.   Start Losartan 100 mg a day.  See me as planned in January.  Burtis Junes, RN, [facility] [address] [place], [place]  [address] [phone]

## 2018-07-14 ENCOUNTER — Ambulatory Visit: Payer: PRIVATE HEALTH INSURANCE | Admitting: Endocrinology

## 2018-07-20 ENCOUNTER — Other Ambulatory Visit: Payer: Self-pay | Admitting: Nurse Practitioner

## 2018-07-20 ENCOUNTER — Other Ambulatory Visit: Payer: Self-pay | Admitting: Physician Assistant

## 2018-07-20 NOTE — Progress Notes (Signed)
Patient ID: [name], male   DOB: Mar 30, 1956, 64 y.o.   MRN: [id number]           Reason for Appointment: Consultation for Type 2 Diabetes  Referring PCP: Truitt Merle   History of Present Illness:          Date of diagnosis of type 2 diabetes mellitus:   2018      Background history:  He has never been told to have diabetes but review of his previous labs indicate that he had high blood sugar 179 in 2014 His highest sugar has been 234 in 10/2016 when his A1c was 8.8, not clear if he was symptomatic at that time He was recommended referral for diabetes at that time but he did not make an appointment  Recent history:   Most recent A1c is done on   07/21/2018 is 7.8  Non-insulin hypoglycemic drugs the patient is taking are: None  Current management, blood sugar patterns and problems identified:  The patient is being referred for evaluation by his cardiology nurse practitioner  He has currently no knowledge of his having diabetes and has no information on diabetes  He has not followed any specific diet   Although his A1c is high his blood sugars recently either in the office or the lab have been 108, 167        Typical meal intake: Breakfast is egg or cereal                Exercise: none, he says that he is on his feet most of the time during the workday    Glucose monitoring:  None  Dietician visit, most recent: Never  Weight history:  Wt Readings from Last 3 Encounters:  07/21/18 179 lb 12.8 oz (81.6 kg)  04/12/18 178 lb 12.8 oz (81.1 kg)  01/11/18 179 lb 12.8 oz (81.6 kg)    Glycemic control:   Lab Results  Component Value Date   HGBA1C 7.8 (A) 07/21/2018   HGBA1C 8.8 (H) 11/09/2016   Lab Results  Component Value Date   LDLCALC 108 (H) 10/19/2016   CREATININE 1.32 (H) 04/12/2018   No results found for: MICRALBCREAT  No results found for: FRUCTOSAMINE  Office Visit on 07/21/2018  Component Date Value Ref Range Status  . Hemoglobin A1C  07/21/2018 7.8* 4.0 - 5.6 % Final  . POC Glucose 07/21/2018 167* 70 - 99 mg/dl Final    Allergies as of 07/21/2018   No Known Allergies     Medication List       Accurate as of July 21, 2018  3:02 PM. Always use your most recent med list.        aspirin 325 MG EC tablet Take 81 mg by mouth daily.   atorvastatin 80 MG tablet Commonly known as:  LIPITOR Take 1 tablet (80 mg total) by mouth daily.   carvedilol 25 MG tablet Commonly known as:  COREG TAKE 1 TABLET BY MOUTH TWICE DAILY WITH MEALS   furosemide 40 MG tablet Commonly known as:  LASIX TAKE 1 TABLET BY MOUTH ONCE DAILY   hydrALAZINE 50 MG tablet Commonly known as:  APRESOLINE Take 1 tablet (50 mg total) by mouth 3 (three) times daily.   ibuprofen 200 MG tablet Commonly known as:  ADVIL,MOTRIN Take 200 mg by mouth every 6 (six) hours as needed for moderate pain (for knee).   isosorbide mononitrate 30 MG 24 hr tablet Commonly known as:  IMDUR Take 1 tablet (30 mg  total) by mouth daily.   losartan 100 MG tablet Commonly known as:  COZAAR Take 1 tablet (100 mg total) by mouth daily.       Allergies: No Known Allergies  Past Medical History:  Diagnosis Date  . Arthritis    "left knee" (11/24/2017)  . CAD (coronary artery disease)    CABG 2002  . CHF (congestive heart failure) ([place])   . Chronic renal insufficiency   . Dilated cardiomyopathy ([place])    echo in 2009 showed improvement with a normal EF  . HTN (hypertension)   . Hyperlipemia   . Noncompliance   . PAD (peripheral artery disease) ([place])     Past Surgical History:  Procedure Laterality Date  . CARDIAC CATHETERIZATION  2002, 2006  . CORONARY ARTERY BYPASS GRAFT  2002   x4 DR. VAN TRIGT    Family History  Problem Relation Age of Onset  . Hypertension Father   . Diabetes Mother        DIABETIC COMA    Social History:  reports that he has never smoked. He has never used smokeless tobacco. He reports that he does not drink alcohol or  use drugs.   Review of Systems  Constitutional: Negative for reduced appetite.  HENT: Negative for trouble swallowing.   Eyes: Negative for blurred vision.  Cardiovascular: Negative for chest pain.  Gastrointestinal: Negative for constipation and diarrhea.  Endocrine: Positive for erectile dysfunction. Negative for fatigue and decreased libido.  Genitourinary: Positive for nocturia.       Has nocturia about twice  Musculoskeletal: Positive for joint pain.       Has mild to moderate pain in his knees.  Has only mild pain in 1 or 2 of his fingers  Skin: Negative for itching.  Neurological: Negative for weakness, numbness, tingling and balance difficulty.     Lipid history: On atorvastatin since diagnosis of CAD, no recent follow-up available    Lab Results  Component Value Date   CHOL 169 10/19/2016   HDL 37 (L) 10/19/2016   LDLCALC 108 (H) 10/19/2016   LDLDIRECT 149.4 01/07/2011   TRIG 119 10/19/2016   CHOLHDL 4.6 10/19/2016           Hypertension: Has been present  BP Readings from Last 3 Encounters:  07/21/18 (!) 150/92  04/12/18 140/70  01/11/18 (!) 158/80    Most recent eye exam was in 2018  Most recent foot exam:  Currently known complications of diabetes:none  LABS:  Office Visit on 07/21/2018  Component Date Value Ref Range Status  . Hemoglobin A1C 07/21/2018 7.8* 4.0 - 5.6 % Final  . POC Glucose 07/21/2018 167* 70 - 99 mg/dl Final    Physical Examination:  BP (!) 150/92 (BP Location: Left Arm, Patient Position: Sitting, Cuff Size: Normal)   Pulse 66   Wt 179 lb 12.8 oz (81.6 kg)   SpO2 99%   BMI 26.17 kg/m   GENERAL:         Patient has   HEENT:         Eye exam shows normal external appearance.  Fundus exam shows no retinopathy but not clearly seen.  Right sided cataract present preventing exam of fundus Oral exam shows normal mucosa .   NECK:   There is no lymphadenopathy  Thyroid is not enlarged and no nodules felt.   Carotids are  normal to palpation and no bruit heard  LUNGS:         Chest is symmetrical. Lungs  are clear to auscultation.[name]   HEART:         Heart sounds:  S1 and S2 are prominent. No murmur or click heard., no S3 or S4.   ABDOMEN:   There is no distention present. Liver and spleen are not palpable.  No other mass or tenderness present.    NEUROLOGICAL:   Ankle jerks are absent bilaterally.   Biceps reflexes are normal to brisk  Diabetic Foot Exam - Simple   Simple Foot Form Diabetic Foot exam was performed with the following findings:  Yes   Visual Inspection No deformities, no ulcerations, no other skin breakdown bilaterally:  Yes See comments:  Yes Sensation Testing Intact to touch and monofilament testing bilaterally:  Yes Pulse Check Posterior Tibialis and Dorsalis pulse intact bilaterally:  Yes See comments:  Yes Comments Distal feet are cold but does especially left Dorsalis pedis pulses: 2/4 on the left and 3/4 on the right Posterior tibialis not felt              MUSCULOSKELETAL:  He has mild to moderate enlargement of most of his finger joints and wrist without tenderness or warmth.  Also has swelling of the posterior part of the elbow joints bilaterally There is no subcutaneous nodule felt on his forearms    EXTREMITIES:     There is no ankle edema.  SKIN:       No rash or lesions of concern.        ASSESSMENT:  Diabetes type 2  See history of present illness for detailed discussion of current diabetes management, blood sugar patterns and problems identified  A1c history indicates he has had diabetes since 2018 but has not been evaluated or managed previously He does not have a PCP Current A1c is 7.8 Discussed with the patient that he clearly has diabetes with his A1c being high and blood sugars in the past occasionally being over 200 He is not symptomatic and does not have significant hyperglycemia more recently Currently has no knowledge about diabetes and not  monitoring blood sugar   Since his blood sugars do not appear to be significantly high recently may do well with metformin alone  Complications of diabetes: Probable erectile dysfunction, unknown status of retinopathy  Atherosclerotic cardiovascular disease and hypertension as well as hyperlipidemia managed by cardiology practice  PLAN:    1. Glucose monitoring: . Patient advised to be starting home glucose monitoring and will need to be instructed by the nurse educator . He will check check readings either fasting or 2 hours after meals at least every other day  2.  Diabetes education: . Patient will need consultation with nutritionist for meal planning and advised him to bring his wife for the session also  3.  Lifestyle changes: . Dietary changes: Have a higher protein breakfast such as eggs and toast instead of cereal . Exercise regimen: Encouraged him to start walking 20 to 30 minutes at least on his days off and overall 3-4 times a week  4.  Medication needed: . This will be determined based on his creatinine level and most likely will give him metformin.  Discussed how metformin works, possible side effects and given him handout on this.  If blood sugars are not adequately controlled or if he has any difficulty tolerating this may consider Invokana or Jardiance depending on his renal function  5.  Preventive care needed:  . Influenza vaccine recommended but patient refuses  6.  Follow-up: About 2 months  There are no Patient Instructions on file for this visit.   Consultation note has been sent to the referring physician  Elayne Snare 07/21/2018, 3:02 PM   Note: This office note was prepared with Dragon voice recognition system technology. Any transcriptional errors that result from this process are unintentional.  ADDENDUM: His creatinine clearance is over 60 and he will start metformin ER 500 mg daily and titrate progressively up to 1500 mg daily Prescription  sent  Elayne Snare

## 2018-07-21 ENCOUNTER — Encounter: Payer: Self-pay | Admitting: Endocrinology

## 2018-07-21 ENCOUNTER — Ambulatory Visit (INDEPENDENT_AMBULATORY_CARE_PROVIDER_SITE_OTHER): Payer: PRIVATE HEALTH INSURANCE | Admitting: Endocrinology

## 2018-07-21 VITALS — BP 150/92 | HR 66 | Wt 179.8 lb

## 2018-07-21 DIAGNOSIS — Z794 Long term (current) use of insulin: Secondary | ICD-10-CM

## 2018-07-21 DIAGNOSIS — E1165 Type 2 diabetes mellitus with hyperglycemia: Secondary | ICD-10-CM

## 2018-07-21 DIAGNOSIS — E7800 Pure hypercholesterolemia, unspecified: Secondary | ICD-10-CM | POA: Diagnosis not present

## 2018-07-21 LAB — COMPREHENSIVE METABOLIC PANEL
ALT: 15 U/L (ref 0–53)
AST: 18 U/L (ref 0–37)
Albumin: 3.8 g/dL (ref 3.5–5.2)
Alkaline Phosphatase: 140 U/L — ABNORMAL HIGH (ref 39–117)
BILIRUBIN TOTAL: 0.6 mg/dL (ref 0.2–1.2)
BUN: 31 mg/dL — ABNORMAL HIGH (ref 6–23)
CALCIUM: 9.7 mg/dL (ref 8.4–10.5)
CO2: 29 meq/L (ref 19–32)
Chloride: 104 mEq/L (ref 96–112)
Creatinine, Ser: 1.45 mg/dL (ref 0.40–1.50)
GFR: 63.2 mL/min (ref >60.00)
Glucose, Bld: 165 mg/dL — ABNORMAL HIGH (ref 70–99)
Potassium: 3.6 mEq/L (ref 3.5–5.1)
Sodium: 137 mEq/L (ref 135–145)
Total Protein: 9.4 g/dL — ABNORMAL HIGH (ref 6.0–8.3)

## 2018-07-21 LAB — MICROALBUMIN / CREATININE URINE RATIO
CREATININE, U: 126.3 mg/dL
MICROALB UR: 12.6 mg/dL — AB (ref 0.0–1.9)
Microalb Creat Ratio: 10 mg/g (ref 0.0–30.0)

## 2018-07-21 LAB — LIPID PANEL
CHOL/HDL RATIO: 2
Cholesterol: 91 mg/dL (ref 0–200)
HDL: 36.8 mg/dL — ABNORMAL LOW (ref >39.00)
LDL CALC: 41 mg/dL (ref 0–99)
NonHDL: 54.35
Triglycerides: 65 mg/dL (ref 0.0–149.0)
VLDL: 13 mg/dL (ref 0.0–40.0)

## 2018-07-21 LAB — POCT GLYCOSYLATED HEMOGLOBIN (HGB A1C): HEMOGLOBIN A1C: 7.8 % — AB (ref 4.0–5.6)

## 2018-07-21 LAB — GLUCOSE, POCT (MANUAL RESULT ENTRY): POC Glucose: 167 mg/dl — AB (ref 70–99)

## 2018-07-21 MED ORDER — METFORMIN HCL ER 500 MG PO TB24: ORAL_TABLET | ORAL | 1 refills | Status: DC

## 2018-07-21 NOTE — Progress Notes (Signed)
Kidney test okay, to start metformin as prescribed, prescription has been sent to [facility].  Also needs to be scheduled for 6 weeks follow-up with labs

## 2018-08-02 ENCOUNTER — Encounter: Payer: Self-pay | Admitting: Nurse Practitioner

## 2018-08-02 ENCOUNTER — Ambulatory Visit (INDEPENDENT_AMBULATORY_CARE_PROVIDER_SITE_OTHER): Payer: PRIVATE HEALTH INSURANCE | Admitting: Nurse Practitioner

## 2018-08-02 VITALS — BP 190/80 | HR 70 | Ht 69.5 in | Wt 182.1 lb

## 2018-08-02 DIAGNOSIS — I259 Chronic ischemic heart disease, unspecified: Secondary | ICD-10-CM

## 2018-08-02 DIAGNOSIS — I10 Essential (primary) hypertension: Secondary | ICD-10-CM | POA: Diagnosis not present

## 2018-08-02 DIAGNOSIS — I5032 Chronic diastolic (congestive) heart failure: Secondary | ICD-10-CM | POA: Diagnosis not present

## 2018-08-02 MED ORDER — HYDRALAZINE HCL 50 MG PO TABS: 75.0000 mg | ORAL_TABLET | Freq: Three times a day (TID) | ORAL | 3 refills | Status: DC

## 2018-08-02 NOTE — Progress Notes (Signed)
CARDIOLOGY OFFICE NOTE  Date:  08/02/2018    [name] Date of Birth: 01-20-1956 Medical Record #[id number]  PCP:  Patient, No Pcp Per  Cardiologist:  Servando Snare & [place]    Chief Complaint  Patient presents with  . Hypertension  . Coronary Artery Disease    Seen for Dr. Martinique    History of Present Illness: [name] is a 63 y.o. male who presents today for a 3 month check. Seen for Dr. Martinique. He typically follows with me.   He has a history ofCAD s/p CABG 2002, HTN,dilated cardiomyopathy, hyperlipidemia and history of noncompliance with follow-up. Echocardiogram in 2009 showed significant improvement in his LV function and EF was normal. Cardiac catheterization in 2006 demonstrated diseasebut he wasnot a candidate for further revascularization. Remote CTA abdomen showed 50% left renal artery stenosis in 2006 as well. Follow up renal duplex was ok.   I saw him back in May - after a prolonged absence - BP sky high. He was in heart failure. He was not taking his medicines correctly - I met his wife who was very much unaware of what [name] was doing - he was telling her a totally different story that was not correct. He ended up being admitted. He was started on Entresto, his carvedilol was increased to 12.5 mg twice daily. He was aggressively diuresed with 80 mg twice daily of Lasix. Echocardiogram obtained on 11/25/2017 showed EF 50 to 55%, grade 1 DD, moderate LVH, moderately dilated left atrium, severely dilated right atrium, peak PA pressure 36 mmHg. His discharge weight was 182 pounds which was down from 207 pounds. He was eventually discharged on 40 mg daily of Lasix with 20 mEq of potassium daily. He was instructed to take additional dose of Lasix on a as needed basis if weight increase by more than 2 to 3 pounds per day.  He saw [name] in June - BP borderline. Had not been given his potassium. Entresto was increased.I then saw him in July - was still off  his potassium.  He was feeling good. His lab show profound elevation in his glucose - most likely diabetic. He would not return our phone calls to discuss/refer. I last saw him in October - he was doing well from our standpoint - we talked about his labs and I referred to Endocrine and he finally went.   Comes in today. Herealone. He says he feels good. BP is high again at his visit. Does not sound like he is checking his sugars. He says he is taking his medicines. But then he is asking about his Hydralazine - says he is taking - told [name] that he had ran out of.  Says his BP is running 140 - 150's at home. He is thinking about taking his wife to his nutritionist appointment - I encouraged him too.   Past Medical History:  Diagnosis Date  . Arthritis    "left knee" (11/24/2017)  . CAD (coronary artery disease)    CABG 2002  . CHF (congestive heart failure) ([place])   . Chronic renal insufficiency   . Dilated cardiomyopathy ([place])    echo in 2009 showed improvement with a normal EF  . HTN (hypertension)   . Hyperlipemia   . Noncompliance   . PAD (peripheral artery disease) ([place])     Past Surgical History:  Procedure Laterality Date  . CARDIAC CATHETERIZATION  2002, 2006  . CORONARY ARTERY BYPASS GRAFT  2002   x4 DR.  VAN TRIGT     Medications: Current Meds  Medication Sig  . aspirin 325 MG EC tablet Take 81 mg by mouth daily.   [name] atorvastatin (LIPITOR) 80 MG tablet Take 1 tablet (80 mg total) by mouth daily.  . carvedilol (COREG) 25 MG tablet TAKE 1 TABLET BY MOUTH TWICE DAILY WITH MEALS  . furosemide (LASIX) 40 MG tablet TAKE 1 TABLET BY MOUTH ONCE DAILY  . hydrALAZINE (APRESOLINE) 50 MG tablet Take 1.5 tablets (75 mg total) by mouth 3 (three) times daily.  [name] ibuprofen (ADVIL,MOTRIN) 200 MG tablet Take 200 mg by mouth every 6 (six) hours as needed for moderate pain (for knee).  . isosorbide mononitrate (IMDUR) 30 MG 24 hr tablet Take 1 tablet (30 mg total) by mouth daily.  [name]  losartan (COZAAR) 100 MG tablet Take 1 tablet (100 mg total) by mouth daily.  . metFORMIN (GLUCOPHAGE-XR) 500 MG 24 hr tablet 1 tablet with supper daily for 7 days, then 2 tablets daily for 7 days and then 3 tablets daily  . [DISCONTINUED] hydrALAZINE (APRESOLINE) 50 MG tablet Take 1 tablet (50 mg total) by mouth 3 (three) times daily.     Allergies: No Known Allergies  Social History: The patient  reports that he has never smoked. He has never used smokeless tobacco. He reports that he does not drink alcohol or use drugs.   Family History: The patient's family history includes Diabetes in his mother; Hypertension in his father.   Review of Systems: Please see the history of present illness.   Otherwise, the review of systems is positive for none.   All other systems are reviewed and negative.   Physical Exam: VS:  BP (!) 190/80 (BP Location: Left Arm, Patient Position: Sitting, Cuff Size: Normal)   Pulse 70   Ht 5' 9.5" (1.765 m)   Wt 182 lb 1.9 oz (82.6 kg)   SpO2 99% Comment: at rest  BMI 26.51 kg/m  .  BMI Body mass index is 26.51 kg/m.  Wt Readings from Last 3 Encounters:  08/02/18 182 lb 1.9 oz (82.6 kg)  07/21/18 179 lb 12.8 oz (81.6 kg)  04/12/18 178 lb 12.8 oz (81.1 kg)   BP is 154/90 by me.   General: Pleasant. Well developed, well nourished and in no acute distress.   HEENT: Normal.  Neck: Supple, no JVD, carotid bruits, or masses noted.  Cardiac: Regular rate and rhythm. No murmurs, rubs, or gallops. No edema.  Respiratory:  Lungs are clear to auscultation bilaterally with normal work of breathing.  GI: Soft and nontender.  MS: No deformity or atrophy. Gait and ROM intact.  Skin: Warm and dry. Color is normal.  Neuro:  Strength and sensation are intact and no gross focal deficits noted.  Psych: Alert, appropriate and with normal affect.   LABORATORY DATA:  EKG:  EKG is not ordered today.  Lab Results  Component Value Date   WBC 5.6 11/24/2017   HGB  12.9 (L) 11/24/2017   HCT 39.4 11/24/2017   PLT 168 11/24/2017   GLUCOSE 165 (H) 07/21/2018   CHOL 91 07/21/2018   TRIG 65.0 07/21/2018   HDL 36.80 (L) 07/21/2018   LDLDIRECT 149.4 01/07/2011   LDLCALC 41 07/21/2018   ALT 15 07/21/2018   AST 18 07/21/2018   NA 137 07/21/2018   K 3.6 07/21/2018   CL 104 07/21/2018   CREATININE 1.45 07/21/2018   BUN 31 (H) 07/21/2018   CO2 29 07/21/2018   TSH 0.510  11/24/2017   INR 1.37 11/24/2017   HGBA1C 7.8 (A) 07/21/2018   MICROALBUR 12.6 (H) 07/21/2018     BNP (last 3 results) Recent Labs    11/24/17 1900  BNP 914.8*    ProBNP (last 3 results) Recent Labs    01/11/18 0856  PROBNP 147     Other Studies Reviewed Today:  Echo 11/25/2017 LV EF: 50% - 55% Study Conclusions  - Left ventricle: The cavity size was normal. There was moderate concentric hypertrophy. Systolic function was normal. The estimated ejection fraction was in the range of 50% to 55%. Wall motion was normal; there were no regional wall motion abnormalities. Doppler parameters are consistent with abnormal left ventricular relaxation (grade 1 diastolic dysfunction). Doppler parameters are consistent with high ventricular filling pressure. - Aortic valve: Transvalvular velocity was within the normal range. There was no stenosis. There was no regurgitation. - Mitral valve: Transvalvular velocity was within the normal range. There was no evidence for stenosis. There was no regurgitation. - Left atrium: The atrium was moderately dilated. - Right ventricle: The cavity size was normal. Wall thickness was normal. Systolic function was normal. - Right atrium: The atrium was severely dilated. - Atrial septum: No defect or patent foramen ovale was identified by color flow Doppler. - Tricuspid valve: There was trivial regurgitation. - Pulmonary arteries: Systolic pressure was within the normal range. PA peak pressure: 36 mm Hg  (S).   Assessment & Plan:  1. HTN - while better - still not at goal - will increase his Hydralazine to 75 mg TID. Could consider Aldactone but with recent addition of Metformin I am hesitant to do this. Afternoon visits seem to work best.   2. Chronic diastolic HF - he is doing well. Weight is staying down.   3. Diabetes - he has seen Endocrine. He is going to see the nutritionist. Now on Metformin.   4. CAD - no active chest pain. CV risk factor modification encouraged.   5. Long standing non compliance - seems to now be staying on track but iffy at best - I still worry - hopefully his compliance will continue.   6. HLD - on statin therapy. Recent labs noted.   Current medicines are reviewed with the patient today.  The patient does not have concerns regarding medicines other than what has been noted above.  The following changes have been made:  See above.  Labs/ tests ordered today include:   No orders of the defined types were placed in this encounter.    Disposition:   FU with me in early May.    Patient is agreeable to this plan and will call if any problems develop in the interim.   SignedTruitt Merle, NP  08/02/2018 4:20 PM  [facility] [address] [place], [place]  [address] Phone: [phone] Fax: [phone]

## 2018-08-02 NOTE — Patient Instructions (Addendum)
We will be checking the following labs today - NONE   Medication Instructions:    Continue with your current medicines. BUT  I am increasing the Hydralazine to 75 mg to take 3 times a day - this will be a pill and a half three times a day - I am sending in a new RX for you.    If you need a refill on your cardiac medications before your next appointment, please call your pharmacy.     Testing/Procedures To Be Arranged:  N/A  Follow-Up:   See me in early May    At [facility], you and your health needs are our priority.  As part of our continuing mission to provide you with exceptional heart care, we have created designated Provider Care Teams.  These Care Teams include your primary Cardiologist (physician) and Advanced Practice Providers (APPs -  Physician Assistants and Nurse Practitioners) who all work together to provide you with the care you need, when you need it.  Special Instructions:  . None  Call the [place] office at [phone] if you have any questions, problems or concerns.

## 2018-08-02 NOTE — Progress Notes (Deleted)
CARDIOLOGY OFFICE NOTE  Date:  08/02/2018    [name] Date of Birth: 01-06-1956 Medical Record #[id number]  PCP:  Patient, No Pcp Per  Cardiologist:  Servando Snare & ***    No chief complaint on file.   History of Present Illness: [name] is a 63 y.o. male who presents today for a follow up visit. Seen for Dr. Martin[place]. He typically follows with me.   He has a history ofCAD s/p CABG 2002, HTN,dilated cardiomyopathy, hyperlipidemia and history of noncompliance with follow-up. Echocardiogram in 2009 showed significant improvement in his LV function and EF was normal. Cardiac catheterization in 2006 demonstrated diseasebut he wasnot a candidate for further revascularization. Remote CTA abdomen showed 50% left renal artery stenosis in 2006 as well. Follow up renal duplex was ok.   I saw him back in May - after a prolonged absence - BP sky high. He was in heart failure. He was not taking his medicines correctly - I met his wife who was very much unaware of what [name] was doing - he was telling her a totally different story that was not correct. He ended up being admitted. He was started on Entresto, his carvedilol was increased to 12.5 mg twice daily. He was aggressively diuresed with 80 mg twice daily of Lasix. Echocardiogram obtained on 11/25/2017 showed EF 50 to 55%, grade 1 DD, moderate LVH, moderately dilated left atrium, severely dilated right atrium, peak PA pressure 36 mmHg. His discharge weight was 182 pounds which was down from 207 pounds. He was eventually discharged on 40 mg daily of Lasix with 20 mEq of potassium daily. He was instructed to take additional dose of Lasix on a as needed basis if weight increase by more than 2 to 3 pounds per day.  He saw [name] in June - BP borderline. Had not been given his potassium. Entresto was increased.I then saw him in July - was still off his potassium He was feeling good. His lab show profound elevation in his glucose  - most likely diabetic. He did not return our phone calls to discuss/refer. I last saw him in October - he was doing well. Clearly diabetic - referred to Dr. [name].   Comes in today. Herealone.   Past Medical History:  Diagnosis Date  . Arthritis    "left knee" (11/24/2017)  . CAD (coronary artery disease)    CABG 2002  . CHF (congestive heart failure) ([place])   . Chronic renal insufficiency   . Dilated cardiomyopathy ([place])    echo in 2009 showed improvement with a normal EF  . HTN (hypertension)   . Hyperlipemia   . Noncompliance   . PAD (peripheral artery disease) ([place])     Past Surgical History:  Procedure Laterality Date  . CARDIAC CATHETERIZATION  2002, 2006  . CORONARY ARTERY BYPASS GRAFT  2002   x4 DR. VAN TRIGT     Medications: No outpatient medications have been marked as taking for the 08/02/18 encounter (Appointment) with Burtis Junes, NP.     Allergies: No Known Allergies  Social History: The patient  reports that he has never smoked. He has never used smokeless tobacco. He reports that he does not drink alcohol or use drugs.   Family History: The patient's family history includes Diabetes in his mother; Hypertension in his father.   Review of Systems: Please see the history of present illness.   Otherwise, the review of systems is positive for none.   All other  systems are reviewed and negative.   Physical Exam: VS:  There were no vitals taken for this visit. [name]  BMI There is no height or weight on file to calculate BMI.  Wt Readings from Last 3 Encounters:  07/21/18 179 lb 12.8 oz (81.6 kg)  04/12/18 178 lb 12.8 oz (81.1 kg)  01/11/18 179 lb 12.8 oz (81.6 kg)    General: Pleasant. Well developed, well nourished and in no acute distress.   HEENT: Normal.  Neck: Supple, no JVD, carotid bruits, or masses noted.  Cardiac: Regular rate and rhythm. No murmurs, rubs, or gallops. No edema.  Respiratory:  Lungs are clear to auscultation bilaterally with  normal work of breathing.  GI: Soft and nontender.  MS: No deformity or atrophy. Gait and ROM intact.  Skin: Warm and dry. Color is normal.  Neuro:  Strength and sensation are intact and no gross focal deficits noted.  Psych: Alert, appropriate and with normal affect.   LABORATORY DATA:  EKG:  EKG is not ordered today.  Lab Results  Component Value Date   WBC 5.6 11/24/2017   HGB 12.9 (L) 11/24/2017   HCT 39.4 11/24/2017   PLT 168 11/24/2017   GLUCOSE 165 (H) 07/21/2018   CHOL 91 07/21/2018   TRIG 65.0 07/21/2018   HDL 36.80 (L) 07/21/2018   LDLDIRECT 149.4 01/07/2011   LDLCALC 41 07/21/2018   ALT 15 07/21/2018   AST 18 07/21/2018   NA 137 07/21/2018   K 3.6 07/21/2018   CL 104 07/21/2018   CREATININE 1.45 07/21/2018   BUN 31 (H) 07/21/2018   CO2 29 07/21/2018   TSH 0.510 11/24/2017   INR 1.37 11/24/2017   HGBA1C 7.8 (A) 07/21/2018   MICROALBUR 12.6 (H) 07/21/2018     BNP (last 3 results) Recent Labs    11/24/17 1900  BNP 914.8*    ProBNP (last 3 results) Recent Labs    01/11/18 0856  PROBNP 147     Other Studies Reviewed Today:   Assessment/Plan:  Echo 11/25/2017 LV EF: 50% - 55% Study Conclusions  - Left ventricle: The cavity size was normal. There was moderate concentric hypertrophy. Systolic function was normal. The estimated ejection fraction was in the range of 50% to 55%. Wall motion was normal; there were no regional wall motion abnormalities. Doppler parameters are consistent with abnormal left ventricular relaxation (grade 1 diastolic dysfunction). Doppler parameters are consistent with high ventricular filling pressure. - Aortic valve: Transvalvular velocity was within the normal range. There was no stenosis. There was no regurgitation. - Mitral valve: Transvalvular velocity was within the normal range. There was no evidence for stenosis. There was no regurgitation. - Left atrium: The atrium was moderately  dilated. - Right ventricle: The cavity size was normal. Wall thickness was normal. Systolic function was normal. - Right atrium: The atrium was severely dilated. - Atrial septum: No defect or patent foramen ovale was identified by color flow Doppler. - Tricuspid valve: There was trivial regurgitation. - Pulmonary arteries: Systolic pressure was within the normal range. PA peak pressure: 36 mm Hg (S).   Assessment & Plan:  1. HTN - this is the best reading he has had in quite some time. Afternoon appointments do work to evaluate status. No changes made today.   2. Chronic diastolic HF - he is doing well. Weight is staying down.   3. Diabetes - referring to Endocrine - he is going to see if his wife's PCP will see him as well. I  have reiterated numerous times during the visit to keep this appointment so that treatment can be started.   4. CAD - no active chest pain.   5. Long standing non compliance - seems to be staying on track - hopefully this will continue.   6. HLD - on statin therapy. Current medicines are reviewed with the patient today.  The patient does not have concerns regarding medicines other than what has been noted above.  The following changes have been made:  See above.  Labs/ tests ordered today include:   No orders of the defined types were placed in this encounter.    Disposition:   FU with *** in {gen number [id number]} {Days to years:10300}.   Patient is agreeable to this plan and will call if any problems develop in the interim.   SignedTruitt Merle, NP  08/02/2018 2:38 PM  [facility] [address] [place], [place]  [address] Phone: [phone] Fax: [phone]           CARDIOLOGY OFFICE NOTE  Date:  08/02/2018    [name] Date of Birth: Nov 24, 1955 Medical Record #[id number]  PCP:  Patient, No Pcp Per  Cardiologist:  Servando Snare & ***    No chief complaint on  file.   History of Present Illness: Charistopher Rumble is a 63 y.o. male who presents today for a ***   Comes in today. Here with   Past Medical History:  Diagnosis Date  . Arthritis    "left knee" (11/24/2017)  . CAD (coronary artery disease)    CABG 2002  . CHF (congestive heart failure) ([place])   . Chronic renal insufficiency   . Dilated cardiomyopathy ([place])    echo in 2009 showed improvement with a normal EF  . HTN (hypertension)   . Hyperlipemia   . Noncompliance   . PAD (peripheral artery disease) ([place])     Past Surgical History:  Procedure Laterality Date  . CARDIAC CATHETERIZATION  2002, 2006  . CORONARY ARTERY BYPASS GRAFT  2002   x4 DR. VAN TRIGT     Medications: No outpatient medications have been marked as taking for the 08/02/18 encounter (Appointment) with Burtis Junes, NP.     Allergies: No Known Allergies  Social History: The patient  reports that he has never smoked. He has never used smokeless tobacco. He reports that he does not drink alcohol or use drugs.   Family History: The patient's ***family history includes Diabetes in his mother; Hypertension in his father.   Review of Systems: Please see the history of present illness.   Otherwise, the review of systems is positive for {NONE DEFAULTED:18576::"none"}.   All other systems are reviewed and negative.   Physical Exam: VS:  There were no vitals taken for this visit. [name]  BMI There is no height or weight on file to calculate BMI.  Wt Readings from Last 3 Encounters:  07/21/18 179 lb 12.8 oz (81.6 kg)  04/12/18 178 lb 12.8 oz (81.1 kg)  01/11/18 179 lb 12.8 oz (81.6 kg)    General: Pleasant. Well developed, well nourished and in no acute distress.   HEENT: Normal.  Neck: Supple, no JVD, carotid bruits, or masses noted.  Cardiac: ***Regular rate and rhythm. No murmurs, rubs, or gallops. No edema.  Respiratory:  Lungs are clear to auscultation bilaterally with normal work of breathing.  GI:  Soft and nontender.  MS: No deformity or atrophy. Gait and ROM intact.  Skin: Warm and dry. Color is normal.  Neuro:  Strength and sensation are intact and no gross focal deficits noted.  Psych: Alert, appropriate and with normal affect.   LABORATORY DATA:  EKG:  EKG {ACTION; IS/IS GMW:[id number]} ordered today. This demonstrates ***.  Lab Results  Component Value Date   WBC 5.6 11/24/2017   HGB 12.9 (L) 11/24/2017   HCT 39.4 11/24/2017   PLT 168 11/24/2017   GLUCOSE 165 (H) 07/21/2018   CHOL 91 07/21/2018   TRIG 65.0 07/21/2018   HDL 36.80 (L) 07/21/2018   LDLDIRECT 149.4 01/07/2011   LDLCALC 41 07/21/2018   ALT 15 07/21/2018   AST 18 07/21/2018   NA 137 07/21/2018   K 3.6 07/21/2018   CL 104 07/21/2018   CREATININE 1.45 07/21/2018   BUN 31 (H) 07/21/2018   CO2 29 07/21/2018   TSH 0.510 11/24/2017   INR 1.37 11/24/2017   HGBA1C 7.8 (A) 07/21/2018   MICROALBUR 12.6 (H) 07/21/2018     BNP (last 3 results) Recent Labs    11/24/17 1900  BNP 914.8*    ProBNP (last 3 results) Recent Labs    01/11/18 0856  PROBNP 147     Other Studies Reviewed Today:   Assessment/Plan:   Current medicines are reviewed with the patient today.  The patient does not have concerns regarding medicines other than what has been noted above.  The following changes have been made:  See above.  Labs/ tests ordered today include:   No orders of the defined types were placed in this encounter.    Disposition:   FU with *** in {gen number [id number]} {Days to years:10300}.   Patient is agreeable to this plan and will call if any problems develop in the interim.   SignedTruitt Merle, NP  08/02/2018 2:38 PM  [facility] [address] [place], [place]  [address] Phone: [phone] Fax: [phone]

## 2018-08-23 ENCOUNTER — Ambulatory Visit: Payer: PRIVATE HEALTH INSURANCE | Admitting: Dietician

## 2018-09-13 ENCOUNTER — Encounter: Payer: No Typology Code available for payment source | Attending: Endocrinology | Admitting: Registered"

## 2018-09-13 ENCOUNTER — Encounter: Payer: Self-pay | Admitting: Registered"

## 2018-09-13 DIAGNOSIS — E1165 Type 2 diabetes mellitus with hyperglycemia: Secondary | ICD-10-CM

## 2018-09-13 NOTE — Progress Notes (Signed)
Diabetes Self-Management Education  Visit Type: First/Initial  Appt. Start Time: 1600 Appt. End Time: 9628  09/19/2018  [name], identified by name and date of birth, is a 63 y.o. male with a diagnosis of Diabetes: Type 2.   ASSESSMENT  There were no vitals taken for this visit. There is no height or weight on file to calculate BMI.   Pt states he would like to get his blood pressure and blood sugar down. Pt states he has been eating more apples, grapes, orange really enjoys oranges, and limits sodium.   Pt states he is almost out of metformin. RD reminded patient importance of continuing to take medication.  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 Weekend activities are more sedentary plays drums at church.  Sleep: 11-5; stays up for the 10 pm news to get weather; may stay up to watch sports -[employer] basketball fan; [employer].  Pt takes time to ponder questions before answering.  Diabetes Self-Management Education - 09/13/18 1618      Visit Information   Visit Type  First/Initial      Initial Visit   Diabetes Type  Type 2    Are you currently following a meal plan?  No    Are you taking your medications as prescribed?  Yes   metformin   Date Diagnosed  unclear      Psychosocial Assessment   Patient Belief/Attitude about Diabetes  Other (comment)   concerned   How often do you need to have someone help you when you read instructions, pamphlets, or other written materials from your doctor or pharmacy?  1 - Never    What is the last grade level you completed in school?  12      Complications   Last HgB A1C per patient/outside source  7.8 %    How often do you check your blood sugar?  0 times/day (not testing)    Have you had a dilated eye exam in the past 12 months?  Yes    Have you had a dental exam in the past 12 months?  No    Are you checking your feet?  No      Dietary  Intake   Breakfast  sugar pops OR sandwich    Snack (morning)  grapes OR graham crackers    Lunch  leftovers, had canned soda yesterday but usually has water with lunch    Dinner  air fryer chicken or pork chops, greens, yams sometimes potato salad, sometimes cookies    Snack (evening)  none    Beverage(s)  not much water, organge soda with dinner occassionally      Exercise   Exercise Type  ADL's   on feet all day at work   How many days per week to you exercise?  0    How many minutes per day do you exercise?  0    Total minutes per week of exercise  0      Patient Education   Previous Diabetes Education  No    Disease state   Definition of diabetes, type 1 and 2, and the diagnosis of diabetes    Nutrition management   Role of diet in the treatment of diabetes and the relationship between the three main macronutrients and blood glucose level    Physical activity and exercise   Role of exercise on diabetes management, blood pressure  control and cardiac health.    Medications  Reviewed patients medication for diabetes, action, purpose, timing of dose and side effects.      Individualized Goals (developed by patient)   Nutrition  General guidelines for healthy choices and portions discussed    Medications  take my medication as prescribed    Reducing Risk  Other (comment)   get more sleep     Outcomes   Expected Outcomes  Demonstrated interest in learning. Expect positive outcomes    Future DMSE  4-6 wks    Program Status  Not Completed       Individualized Plan for Diabetes Self-Management Training:   Learning Objective:  Patient will have a greater understanding of diabetes self-management. Patient education plan is to attend individual and/or group sessions per assessed needs and concerns.   Patient Instructions  Remember to refill your metformin  Breakfast ideas: egg on toast, occasional slice of bacon should be okay. If you want to continue having cereal sometime, try  another type with less carbs such as multigrain cheerios and be sure to still think about a protein with it. Peanut butter comes in single serving containers you can take to work so you can have protein with your break time snack. Nuts are also a convenient way to get more protein with your carbs  When having chinese be careful not to have too much rice. And For sodium consideration not much soy sauce and you can ask for lower sodium soy sauce.  To help with blood pressure: Consider having 1/2 your plate filled with non-starchy vegetables Reducing sodium by eating out only on occasion, if you eat more cheese to balance carbs (fruit), have your sandwiches without cheese.  Getting in some more physical activity on your days off. Getting another hour or two of sleep may also help.   Expected Outcomes:  Demonstrated interest in learning. Expect positive outcomes  Education material provided: My Plate and Snack sheet  If problems or questions, patient to contact team via:  Phone  Future DSME appointment: 4-6 wks

## 2018-09-13 NOTE — Patient Instructions (Addendum)
Remember to refill your metformin  Breakfast ideas: egg on toast, occasional slice of bacon should be okay. If you want to continue having cereal sometime, try another type with less carbs such as multigrain cheerios and be sure to still think about a protein with it. Peanut butter comes in single serving containers you can take to work so you can have protein with your break time snack. Nuts are also a convenient way to get more protein with your carbs  When having chinese be careful not to have too much rice. And For sodium consideration not much soy sauce and you can ask for lower sodium soy sauce.  To help with blood pressure: Consider having 1/2 your plate filled with non-starchy vegetables Reducing sodium by eating out only on occasion, if you eat more cheese to balance carbs (fruit), have your sandwiches without cheese.  Getting in some more physical activity on your days off. Getting another hour or two of sleep may also help.

## 2018-09-19 DIAGNOSIS — E1165 Type 2 diabetes mellitus with hyperglycemia: Secondary | ICD-10-CM | POA: Insufficient documentation

## 2018-11-07 ENCOUNTER — Other Ambulatory Visit: Payer: Self-pay | Admitting: Endocrinology

## 2018-11-14 ENCOUNTER — Ambulatory Visit: Payer: PRIVATE HEALTH INSURANCE | Admitting: Nurse Practitioner

## 2018-11-16 ENCOUNTER — Telehealth: Payer: Self-pay | Admitting: *Deleted

## 2018-11-16 NOTE — Telephone Encounter (Signed)
lvm with instructions for a telehealth visit on Monday, May 11 @ 2:00 with Truitt Merle, NP. Pt was advised if cannot keep appt to call office to R/S.

## 2018-11-20 ENCOUNTER — Other Ambulatory Visit: Payer: Self-pay | Admitting: Physician Assistant

## 2018-11-20 NOTE — Progress Notes (Signed)
Telehealth Visit Evaluation Performed:  Follow-up visit  This visit type was conducted due to national recommendations for restrictions regarding the COVID-19 Pandemic (e.g. social distancing).  This format is felt to be most appropriate for this patient at this time.  All issues noted in this document were discussed and addressed.  No physical exam was performed (except for noted visual exam findings with Video Visits).  Please refer to the patient's chart (MyChart message for video visits and phone note for telephone visits) for the patient's consent to telehealth for [facility].  Date:  11/21/2018   ID:  [name], DOB [birth year], MRN [id number]  Patient Location:    Provider location:     PCP:  Patient, No Pcp Per  Cardiologist:  Servando Snare & Peter Martinique, MD  Electrophysiologist:  None   Chief Complaint:    History of Present Illness:    [name] is a 63 y.o. male who presents via audio/video conferencing for a telehealth visit today.  Seen for Dr. Martinique. He typically follows with me.   He has a history ofCAD s/p CABG 2002, HTN,dilated cardiomyopathy, hyperlipidemia and history of noncompliance with follow-up. Echocardiogram in 2009 showed significant improvement in his LV function and EF was normal. Cardiac catheterization in 2006 demonstrated diseasebut he wasnot a candidate for further revascularization. Remote CTA abdomen showed 50% left renal artery stenosis in 2006 as well. Follow up renal duplex was ok.   I saw him back in May of 2019 - after a prolonged absence - BPsky high. He was in heart failure. He was not taking his medicines correctly - I met his wife who was very much unaware of what [name] was doing - he was telling her a totally different story that was not correct.He ended up being admitted.He was started on Entresto, his carvedilol was increased to 12.5 mg twice daily. He was aggressively diuresed with 80 mg twice daily of Lasix.  Echocardiogram obtained on 11/25/2017 showed EF 50 to 55%, grade 1 DD, moderate LVH, moderately dilated left atrium, severely dilated right atrium, peak PA pressure 36 mmHg. His discharge weight was 182 pounds which was down from 207 pounds. He was eventually discharged on 40 mg daily of Lasix with 20 mEq of potassium daily. He was instructed to take additional dose of Lasix on a as needed basis if weight increase by more than 2 to 3 pounds per day.  He saw [name] in June of 2019 -BP borderline. Had not been given his potassium. Entresto was increased.I then saw him in July - was still off his potassium.  He was feeling good. His lab show profound elevation in his glucose - most likely diabetic. He would not return our phone calls to discuss/refer but finally got him to Endocrine. Last check with me was back in January - BP was up - his medicines were questionable again. I increased his Hydralazine.   T  Seen today via has consented for this visit.    Past Medical History:  Diagnosis Date  . Arthritis    "left knee" (11/24/2017)  . CAD (coronary artery disease)    CABG 2002  . CHF (congestive heart failure) ([place])   . Chronic renal insufficiency   . Dilated cardiomyopathy ([place])    echo in 2009 showed improvement with a normal EF  . HTN (hypertension)   . Hyperlipemia   . Noncompliance   . PAD (peripheral artery disease) ([place])    Past Surgical History:  Procedure Laterality Date  .  CARDIAC CATHETERIZATION  2002, 2006  . CORONARY ARTERY BYPASS GRAFT  2002   x4 DR. VAN TRIGT     No outpatient medications have been marked as taking for the 11/21/18 encounter (Telemedicine) with Burtis Junes, NP.     Allergies:   Patient has no known allergies.   Social History   Tobacco Use  . Smoking status: Never Smoker  . Smokeless tobacco: Never Used  Substance Use Topics  . Alcohol use: No  . Drug use: No     Family Hx: The patient's family history includes Diabetes in his  mother; Hypertension in his father.  ROS:   Please see the history of present illness.   All other systems reviewed are negative except for .    Objective:    Vital Signs:  There were no vitals taken for this visit.   Wt Readings from Last 3 Encounters:  08/02/18 182 lb 1.9 oz (82.6 kg)  07/21/18 179 lb 12.8 oz (81.6 kg)  04/12/18 178 lb 12.8 oz (81.1 kg)    Alert male in no acute distress.   Labs/Other Tests and Data Reviewed:    Lab Results  Component Value Date   WBC 5.6 11/24/2017   HGB 12.9 (L) 11/24/2017   HCT 39.4 11/24/2017   PLT 168 11/24/2017   GLUCOSE 165 (H) 07/21/2018   CHOL 91 07/21/2018   TRIG 65.0 07/21/2018   HDL 36.80 (L) 07/21/2018   LDLDIRECT 149.4 01/07/2011   LDLCALC 41 07/21/2018   ALT 15 07/21/2018   AST 18 07/21/2018   NA 137 07/21/2018   K 3.6 07/21/2018   CL 104 07/21/2018   CREATININE 1.45 07/21/2018   BUN 31 (H) 07/21/2018   CO2 29 07/21/2018   TSH 0.510 11/24/2017   INR 1.37 11/24/2017   HGBA1C 7.8 (A) 07/21/2018   MICROALBUR 12.6 (H) 07/21/2018     BNP (last 3 results) Recent Labs    11/24/17 1900  BNP 914.8*    ProBNP (last 3 results) Recent Labs    01/11/18 0856  PROBNP 147      Prior CV studies:    The following studies were reviewed today:  Echo 11/25/2017 LV EF: 50% - 55% Study Conclusions  - Left ventricle: The cavity size was normal. There was moderate concentric hypertrophy. Systolic function was normal. The estimated ejection fraction was in the range of 50% to 55%. Wall motion was normal; there were no regional wall motion abnormalities. Doppler parameters are consistent with abnormal left ventricular relaxation (grade 1 diastolic dysfunction). Doppler parameters are consistent with high ventricular filling pressure. - Aortic valve: Transvalvular velocity was within the normal range. There was no stenosis. There was no regurgitation. - Mitral valve: Transvalvular velocity was  within the normal range. There was no evidence for stenosis. There was no regurgitation. - Left atrium: The atrium was moderately dilated. - Right ventricle: The cavity size was normal. Wall thickness was normal. Systolic function was normal. - Right atrium: The atrium was severely dilated. - Atrial septum: No defect or patent foramen ovale was identified by color flow Doppler. - Tricuspid valve: There was trivial regurgitation. - Pulmonary arteries: Systolic pressure was within the normal range. PA peak pressure: 36 mm Hg (S).     ASSESSMENT & PLAN:     1.HTN - while better - still not at goal - will increase his Hydralazine to 75 mg TID. Could consider Aldactone but with recent addition of Metformin I am hesitant to do this. Afternoon  visits seem to work best.   2.Chronic diastolic HF -he is doing well. Weight is staying down.   3. Diabetes - he has seen Endocrine. He is going to see the nutritionist. Now on Metformin.   4. CAD - no active chest pain. CV risk factor modification encouraged.   5.Long standing non compliance - seems to now bestaying on track but iffy at best - I still worry - hopefully his compliance will continue.  6. HLD - on statin therapy. Recent labs noted.   7. COVID-19 Education: The signs and symptoms of COVID-19 were discussed with the patient and how to seek care for testing (follow up with PCP or arrange E-visit).  The importance of social distancing, staying at home, hand hygiene and wearing a mask when out in public were discussed today.  Patient Risk:   After full review of this patient's clinical status, I feel that they are at least moderate risk at this time.  Time:   Today, I have spent  minutes with the patient with telehealth technology discussing the above issues.     Medication Adjustments/Labs and Tests Ordered: Current medicines are reviewed at length with the patient today.  Concerns regarding medicines are  outlined above.   Tests Ordered: No orders of the defined types were placed in this encounter.   Medication Changes: No orders of the defined types were placed in this encounter.   Disposition:  FU with   Patient is agreeable to this plan and will call if any problems develop in the interim.   Amie Critchley, NP  11/21/2018 1:54 PM    [facility]

## 2018-11-21 ENCOUNTER — Telehealth (INDEPENDENT_AMBULATORY_CARE_PROVIDER_SITE_OTHER): Payer: PRIVATE HEALTH INSURANCE | Admitting: Nurse Practitioner

## 2018-11-21 ENCOUNTER — Other Ambulatory Visit: Payer: Self-pay

## 2018-11-21 ENCOUNTER — Encounter: Payer: Self-pay | Admitting: Nurse Practitioner

## 2018-11-21 DIAGNOSIS — I259 Chronic ischemic heart disease, unspecified: Secondary | ICD-10-CM | POA: Diagnosis not present

## 2018-11-21 DIAGNOSIS — Z7189 Other specified counseling: Secondary | ICD-10-CM

## 2018-11-21 DIAGNOSIS — I10 Essential (primary) hypertension: Secondary | ICD-10-CM | POA: Diagnosis not present

## 2018-11-21 DIAGNOSIS — I5032 Chronic diastolic (congestive) heart failure: Secondary | ICD-10-CM

## 2018-11-21 NOTE — Patient Instructions (Signed)
After Visit Summary:  We will be checking the following labs today -   If you have labs (blood work) drawn today and your tests are completely normal, you will receive your results only by: [name] MyChart Message (if you have MyChart) OR . A paper copy in the mail If you have any lab test that is abnormal or we need to change your treatment, we will call you to review the results.   Medication Instructions:    Continue with your current medicines.    If you need a refill on your cardiac medications before your next appointment, please call your pharmacy.     Testing/Procedures To Be Arranged:  N/A  Follow-Up:   See     At [facility], you and your health needs are our priority.  As part of our continuing mission to provide you with exceptional heart care, we have created designated Provider Care Teams.  These Care Teams include your primary Cardiologist (physician) and Advanced Practice Providers (APPs -  Physician Assistants and Nurse Practitioners) who all work together to provide you with the care you need, when you need it.  Special Instructions:  . Stay safe, stay home, wash your hands for at least 20 seconds and wear a mask when out in public.  . It was good to talk with you today.    Call the [place] office at [phone] if you have any questions, problems or concerns.

## 2018-11-21 NOTE — Telephone Encounter (Signed)
lvm that pt had a telehealth visit today with Truitt Merle, NP.  Please call office to R/S.

## 2018-11-21 NOTE — Telephone Encounter (Signed)
lvm pt had a telehealth visit today with Truitt Merle, NP.  Pt is advised to call office too R/S.

## 2018-12-15 ENCOUNTER — Telehealth: Payer: Self-pay | Admitting: *Deleted

## 2018-12-15 NOTE — Telephone Encounter (Signed)
lvm to go over instructions for upcoming telehealth visit on June 9 @ 11:00.  Pt has R/S this visit.  Pt is advised to call office to R/S if pt cannot keep appt.  Consent will be given the day of telehealth visit.

## 2018-12-20 ENCOUNTER — Telehealth: Payer: PRIVATE HEALTH INSURANCE | Admitting: Nurse Practitioner

## 2018-12-29 ENCOUNTER — Other Ambulatory Visit: Payer: Self-pay | Admitting: Endocrinology

## 2019-01-23 ENCOUNTER — Telehealth: Payer: Self-pay | Admitting: Nurse Practitioner

## 2019-01-23 NOTE — Telephone Encounter (Signed)
New Message ° ° ° °Left message to confirm appt and answer COVID questions  °

## 2019-01-23 NOTE — Progress Notes (Signed)
CARDIOLOGY OFFICE NOTE  Date:  01/24/2019    [name] Date of Birth: July 01, 1956 Medical Record #[id number]  PCP:  Patient, No Pcp Per  Cardiologist:  Servando Snare & [place]    Chief Complaint  Patient presents with   Follow-up    Seen for Dr. Martin[place]    History of Present Illness: [name] is a 63 y.o. male who presents today for a follow up visit. Seen for Dr. [place].  He typically follows with me.   He has a history ofCAD s/p CABG 2002, HTN,dilated cardiomyopathy, hyperlipidemia and history of noncompliance with follow-up. Echocardiogram in 2009 showed significant improvement in his LV function and EF was normal. Cardiac catheterization in 2006 demonstrated diseasebut he wasnot a candidate for further revascularization. Remote CTA abdomen showed 50% left renal artery stenosis in 2006 as well. Follow up renal duplex was ok.   I saw him back in May of 2019 - after a prolonged absence - BPsky high. He was in heart failure. He was not taking his medicines correctly - I met his wife who was very much unaware of what [place] was doing - he was telling her a totally different story that was not correct.He ended up being admitted.He was started on Entresto, his carvedilol was increased to 12.5 mg twice daily. He was aggressively diuresed with 80 mg twice daily of Lasix. Echocardiogram obtained on 11/25/2017 showed EF 50 to 55%, grade 1 DD, moderate LVH, moderately dilated left atrium, severely dilated right atrium, peak PA pressure 36 mmHg. His discharge weight was 182 pounds which was down from 207 pounds. He was eventually discharged on 40 mg daily of Lasix with 20 mEq of potassium daily. He was instructed to take additional dose of Lasix on a as needed basis if weight increase by more than 2 to 3 pounds per day.  He saw [name] in June -BP borderline. Had not been given his potassium. Entresto was increased.I then saw him in July - was still off his potassium.  He  was feeling good. His lab show profound elevation in his glucose - most likely diabetic. He would not return our phone calls to discuss/refer.We were finally able to convince him to see Endocrine for his diabetes. Last visit with me was in January - BP up some - medicines again a little unclear. Hydralazine was increased. Hesitant to use Aldactone given that he had been placed on Metformin - I was worried about possible kidney impairment and that he would be lax in getting follow up lab.   The patient does not have symptoms concerning for COVID-19 infection (fever, chills, cough, or new shortness of breath).   Comes in today. Here alone. Doing pretty good. He is out of some medicines. Out of Imdur. Out of Metformin. Needs to make an appointment with Dr. [name]. He is feeling good. His work has not been interrupted with the COVID crisis. No chest pain. Breathing is good.   Past Medical History:  Diagnosis Date   Arthritis    "left knee" (11/24/2017)   CAD (coronary artery disease)    CABG 2002   CHF (congestive heart failure) (HCC)    Chronic renal insufficiency    Dilated cardiomyopathy ([place])    echo in 2009 showed improvement with a normal EF   HTN (hypertension)    Hyperlipemia    Noncompliance    PAD (peripheral artery disease) ([place])     Past Surgical History:  Procedure Laterality Date   CARDIAC CATHETERIZATION  2002, 2006   CORONARY ARTERY BYPASS GRAFT  2002   x4 DR. VAN TRIGT     Medications: Current Meds  Medication Sig   aspirin 325 MG EC tablet Take 325 mg by mouth daily.    atorvastatin (LIPITOR) 80 MG tablet Take 1 tablet (80 mg total) by mouth daily.   carvedilol (COREG) 25 MG tablet Take 1 tablet (25 mg total) by mouth 2 (two) times daily with a meal.   furosemide (LASIX) 40 MG tablet TAKE 1 TABLET BY MOUTH ONCE DAILY   hydrALAZINE (APRESOLINE) 50 MG tablet Take 1.5 tablets (75 mg total) by mouth 3 (three) times daily.   ibuprofen (ADVIL,MOTRIN) 200  MG tablet Take 200 mg by mouth every 6 (six) hours as needed for moderate pain (for knee).   isosorbide mononitrate (IMDUR) 30 MG 24 hr tablet Take 1 tablet (30 mg total) by mouth daily.   losartan (COZAAR) 100 MG tablet Take 1 tablet (100 mg total) by mouth daily.   [DISCONTINUED] atorvastatin (LIPITOR) 80 MG tablet Take 1 tablet (80 mg total) by mouth daily.   [DISCONTINUED] carvedilol (COREG) 25 MG tablet TAKE 1 TABLET BY MOUTH TWICE DAILY WITH MEALS   [DISCONTINUED] isosorbide mononitrate (IMDUR) 30 MG 24 hr tablet Take 1 tablet (30 mg total) by mouth daily.     Allergies: No Known Allergies  Social History: The patient  reports that he has never smoked. He has never used smokeless tobacco. He reports that he does not drink alcohol or use drugs.   Family History: The patient's family history includes Diabetes in his mother; Hypertension in his father.   Review of Systems: Please see the history of present illness.   All other systems are reviewed and negative.   Physical Exam: VS:  BP (!) 150/92    Pulse 61    Ht 5' 9.5" (1.765 m)    Wt 180 lb (81.6 kg)    BMI 26.20 kg/m  .  BMI Body mass index is 26.2 kg/m.  Wt Readings from Last 3 Encounters:  01/24/19 180 lb (81.6 kg)  08/02/18 182 lb 1.9 oz (82.6 kg)  07/21/18 179 lb 12.8 oz (81.6 kg)   BP is 145/80 by me.   General: Pleasant. Alert and in no acute distress.   HEENT: Normal.  Neck: Supple, no JVD, carotid bruits, or masses noted.  Cardiac: Regular rate and rhythm. No murmurs, rubs, or gallops. No edema.  Respiratory:  Lungs are clear to auscultation bilaterally with normal work of breathing.  GI: Soft and nontender.  MS: No deformity or atrophy. Gait and ROM intact.  Skin: Warm and dry. Color is normal.  Neuro:  Strength and sensation are intact and no gross focal deficits noted.  Psych: Alert, appropriate and with normal affect.   LABORATORY DATA:  EKG:  EKG is ordered today. This demonstrates NSR with  lateral T wave changes - he has had these changes on prior EKGs.  Lab Results  Component Value Date   WBC 5.6 11/24/2017   HGB 12.9 (L) 11/24/2017   HCT 39.4 11/24/2017   PLT 168 11/24/2017   GLUCOSE 165 (H) 07/21/2018   CHOL 91 07/21/2018   TRIG 65.0 07/21/2018   HDL 36.80 (L) 07/21/2018   LDLDIRECT 149.4 01/07/2011   LDLCALC 41 07/21/2018   ALT 15 07/21/2018   AST 18 07/21/2018   NA 137 07/21/2018   K 3.6 07/21/2018   CL 104 07/21/2018   CREATININE 1.45 07/21/2018   BUN 31 (  H) 07/21/2018   CO2 29 07/21/2018   TSH 0.510 11/24/2017   INR 1.37 11/24/2017   HGBA1C 7.8 (A) 07/21/2018   MICROALBUR 12.6 (H) 07/21/2018     BNP (last 3 results) No results for input(s): BNP in the last 8760 hours.  ProBNP (last 3 results) No results for input(s): PROBNP in the last 8760 hours.   Other Studies Reviewed Today:  Echo 11/25/2017 LV EF: 50% - 55% Study Conclusions  - Left ventricle: The cavity size was normal. There was moderate concentric hypertrophy. Systolic function was normal. The estimated ejection fraction was in the range of 50% to 55%. Wall motion was normal; there were no regional wall motion abnormalities. Doppler parameters are consistent with abnormal left ventricular relaxation (grade 1 diastolic dysfunction). Doppler parameters are consistent with high ventricular filling pressure. - Aortic valve: Transvalvular velocity was within the normal range. There was no stenosis. There was no regurgitation. - Mitral valve: Transvalvular velocity was within the normal range. There was no evidence for stenosis. There was no regurgitation. - Left atrium: The atrium was moderately dilated. - Right ventricle: The cavity size was normal. Wall thickness was normal. Systolic function was normal. - Right atrium: The atrium was severely dilated. - Atrial septum: No defect or patent foramen ovale was identified by color flow Doppler. - Tricuspid  valve: There was trivial regurgitation. - Pulmonary arteries: Systolic pressure was within the normal range. PA peak pressure: 36 mm Hg (S).   Assessment & Plan:  1.HTN - etter by me today. I have left him on his current regimen for now.   2.Chronic diastolic HF -has better BP control. Reminded about salt restriction.   3. Diabetes - he has seen Endocrine but has not had his follow up. I have sent in his refill for his Metformin - he understands that he needs to see Dr. [name] going forward for further refills.  4. CAD - no active chest pain. No changes made today. Refilled his Imdur - he has been out of this.   5.Long standing non compliance - still somewhat iffy but better than he has been in the past.   6. HLD - on statin - lab today.   7. COVID-19 Education: The signs and symptoms of COVID-19 were discussed with the patient and how to seek care for testing (follow up with PCP or arrange E-visit).  The importance of social distancing, staying at home, hand hygiene and wearing a mask when out in public were discussed today.  Current medicines are reviewed with the patient today.  The patient does not have concerns regarding medicines other than what has been noted above.  The following changes have been made:  See above.  Labs/ tests ordered today include:    Orders Placed This Encounter  Procedures   Basic metabolic panel   CBC   Hepatic function panel   Lipid panel   Hemoglobin A1c   EKG 12-Lead     Disposition:   FU with me in 6 months.   Patient is agreeable to this plan and will call if any problems develop in the interim.   SignedTruitt Merle, NP  01/24/2019 11:35 AM  [facility] [address] [place], [place]  [address] Phone: [phone] Fax: [phone]

## 2019-01-24 ENCOUNTER — Other Ambulatory Visit: Payer: Self-pay

## 2019-01-24 ENCOUNTER — Encounter: Payer: Self-pay | Admitting: Nurse Practitioner

## 2019-01-24 ENCOUNTER — Ambulatory Visit (INDEPENDENT_AMBULATORY_CARE_PROVIDER_SITE_OTHER): Payer: PRIVATE HEALTH INSURANCE | Admitting: Nurse Practitioner

## 2019-01-24 VITALS — BP 150/92 | HR 61 | Ht 69.5 in | Wt 180.0 lb

## 2019-01-24 DIAGNOSIS — I5032 Chronic diastolic (congestive) heart failure: Secondary | ICD-10-CM

## 2019-01-24 DIAGNOSIS — Z9119 Patient's noncompliance with other medical treatment and regimen: Secondary | ICD-10-CM

## 2019-01-24 DIAGNOSIS — E785 Hyperlipidemia, unspecified: Secondary | ICD-10-CM

## 2019-01-24 DIAGNOSIS — I10 Essential (primary) hypertension: Secondary | ICD-10-CM

## 2019-01-24 DIAGNOSIS — I25810 Atherosclerosis of coronary artery bypass graft(s) without angina pectoris: Secondary | ICD-10-CM | POA: Diagnosis not present

## 2019-01-24 DIAGNOSIS — I259 Chronic ischemic heart disease, unspecified: Secondary | ICD-10-CM

## 2019-01-24 DIAGNOSIS — Z7189 Other specified counseling: Secondary | ICD-10-CM

## 2019-01-24 DIAGNOSIS — E1169 Type 2 diabetes mellitus with other specified complication: Secondary | ICD-10-CM

## 2019-01-24 DIAGNOSIS — Z91199 Patient's noncompliance with other medical treatment and regimen due to unspecified reason: Secondary | ICD-10-CM

## 2019-01-24 MED ORDER — METFORMIN HCL ER 500 MG PO TB24: ORAL_TABLET | ORAL | 1 refills | Status: DC

## 2019-01-24 MED ORDER — CARVEDILOL 25 MG PO TABS: 25.0000 mg | ORAL_TABLET | Freq: Two times a day (BID) | ORAL | 3 refills | Status: DC

## 2019-01-24 MED ORDER — ATORVASTATIN CALCIUM 80 MG PO TABS: 80.0000 mg | ORAL_TABLET | Freq: Every day | ORAL | 3 refills | Status: DC

## 2019-01-24 MED ORDER — ISOSORBIDE MONONITRATE ER 30 MG PO TB24: 30.0000 mg | ORAL_TABLET | Freq: Every day | ORAL | 3 refills | Status: DC

## 2019-01-24 NOTE — Patient Instructions (Addendum)
After Visit Summary:  We will be checking the following labs today - BMET, CBC, HPF, Lipids and A1C   Medication Instructions:    Continue with your current medicines.   I sent in your refills today  You need to see Dr. [name] to get the Metformin refilled - I sent this in today for you. His number is [phone]   If you need a refill on your cardiac medications before your next appointment, please call your pharmacy.     Testing/Procedures To Be Arranged:  N/A  Follow-Up:   See me in 6 months.      At [facility], you and your health needs are our priority.  As part of our continuing mission to provide you with exceptional heart care, we have created designated Provider Care Teams.  These Care Teams include your primary Cardiologist (physician) and Advanced Practice Providers (APPs -  Physician Assistants and Nurse Practitioners) who all work together to provide you with the care you need, when you need it.  Special Instructions:  . Stay safe, stay home, wash your hands for at least 20 seconds and wear a mask when out in public.  . It was good to talk with you today.    Call the [place] office at [phone] if you have any questions, problems or concerns.

## 2019-01-25 LAB — BASIC METABOLIC PANEL
BUN/Creatinine Ratio: 16 (ref 10–24)
BUN: 27 mg/dL (ref 8–27)
CO2: 23 mmol/L (ref 20–29)
Calcium: 9.5 mg/dL (ref 8.6–10.2)
Chloride: 103 mmol/L (ref 96–106)
Creatinine, Ser: 1.72 mg/dL — ABNORMAL HIGH (ref 0.76–1.27)
GFR calc Af Amer: 48 mL/min/{1.73_m2} — ABNORMAL LOW (ref >59)
GFR calc non Af Amer: 41 mL/min/{1.73_m2} — ABNORMAL LOW (ref >59)
Glucose: 118 mg/dL — ABNORMAL HIGH (ref 65–99)
Potassium: 3.9 mmol/L (ref 3.5–5.2)
Sodium: 140 mmol/L (ref 134–144)

## 2019-01-25 LAB — LIPID PANEL
Chol/HDL Ratio: 2.3 ratio (ref 0.0–5.0)
Cholesterol, Total: 93 mg/dL — ABNORMAL LOW (ref 100–199)
HDL: 41 mg/dL (ref >39)
LDL Calculated: 39 mg/dL (ref 0–99)
Triglycerides: 67 mg/dL (ref 0–149)
VLDL Cholesterol Cal: 13 mg/dL (ref 5–40)

## 2019-01-25 LAB — HEMOGLOBIN A1C
Est. average glucose Bld gHb Est-mCnc: 157 mg/dL
Hgb A1c MFr Bld: 7.1 % — ABNORMAL HIGH (ref 4.8–5.6)

## 2019-01-25 LAB — CBC
Hematocrit: 30.4 % — ABNORMAL LOW (ref 37.5–51.0)
Hemoglobin: 10.2 g/dL — ABNORMAL LOW (ref 13.0–17.7)
MCH: 28.8 pg (ref 26.6–33.0)
MCHC: 33.6 g/dL (ref 31.5–35.7)
MCV: 86 fL (ref 79–97)
Platelets: 208 10*3/uL (ref 150–450)
RBC: 3.54 x10E6/uL — ABNORMAL LOW (ref 4.14–5.80)
RDW: 13.3 % (ref 11.6–15.4)
WBC: 7 10*3/uL (ref 3.4–10.8)

## 2019-01-25 LAB — HEPATIC FUNCTION PANEL
ALT: 15 IU/L (ref 0–44)
AST: 18 IU/L (ref 0–40)
Albumin: 4.1 g/dL (ref 3.8–4.8)
Alkaline Phosphatase: 135 IU/L — ABNORMAL HIGH (ref 39–117)
Bilirubin Total: 0.7 mg/dL (ref 0.0–1.2)
Bilirubin, Direct: 0.19 mg/dL (ref 0.00–0.40)
Total Protein: 9.1 g/dL — ABNORMAL HIGH (ref 6.0–8.5)

## 2019-02-13 ENCOUNTER — Encounter: Payer: Self-pay | Admitting: *Deleted

## 2019-02-15 ENCOUNTER — Other Ambulatory Visit: Payer: Self-pay | Admitting: Nurse Practitioner

## 2019-02-22 ENCOUNTER — Other Ambulatory Visit: Payer: PRIVATE HEALTH INSURANCE

## 2019-02-24 ENCOUNTER — Ambulatory Visit: Payer: PRIVATE HEALTH INSURANCE | Admitting: Endocrinology

## 2019-03-10 ENCOUNTER — Other Ambulatory Visit: Payer: Self-pay

## 2019-03-10 ENCOUNTER — Other Ambulatory Visit (INDEPENDENT_AMBULATORY_CARE_PROVIDER_SITE_OTHER): Payer: PRIVATE HEALTH INSURANCE

## 2019-03-10 DIAGNOSIS — Z794 Long term (current) use of insulin: Secondary | ICD-10-CM

## 2019-03-10 DIAGNOSIS — E1165 Type 2 diabetes mellitus with hyperglycemia: Secondary | ICD-10-CM

## 2019-03-10 LAB — BASIC METABOLIC PANEL
BUN: 36 mg/dL — ABNORMAL HIGH (ref 6–23)
CO2: 27 mEq/L (ref 19–32)
Calcium: 9.4 mg/dL (ref 8.4–10.5)
Chloride: 107 mEq/L (ref 96–112)
Creatinine, Ser: 1.85 mg/dL — ABNORMAL HIGH (ref 0.40–1.50)
GFR: 44.8 mL/min — ABNORMAL LOW (ref >60.00)
Glucose, Bld: 107 mg/dL — ABNORMAL HIGH (ref 70–99)
Potassium: 3.8 mEq/L (ref 3.5–5.1)
Sodium: 139 mEq/L (ref 135–145)

## 2019-03-11 LAB — FRUCTOSAMINE: Fructosamine: 322 umol/L — ABNORMAL HIGH (ref 0–285)

## 2019-03-13 NOTE — Progress Notes (Signed)
Patient ID: [name], male   DOB: January 28, 1956, 63 y.o.   MRN: [id number]           Reason for Appointment: Follow-up for Type 2 Diabetes  Referring PCP: Truitt Merle  Today's office visit was provided via telemedicine using video technique The patient was explained the limitations of evaluation and management by telemedicine and the availability of in person appointments.  The patient understood the limitations and agreed to proceed. Patient also understood that the telehealth visit is billable. . Location of the patient: Patient's home . Location of the provider: Physician office Only the patient and myself were participating in the encounter     History of Present Illness:          Date of diagnosis of type 2 diabetes mellitus:   2018      Background history:  He has never been told to have diabetes but review of his previous labs indicate that he had high blood sugar 179 in 2014 His highest sugar has been 234 in 10/2016 when his A1c was 8.8, not clear if he was symptomatic at that time He was recommended referral for diabetes at that time but he did not make an appointment  Recent history:   Most recent A1c is 7.1 done on  01/24/2019  Non-insulin hypoglycemic drugs the patient is taking are: None  Current management, blood sugar patterns and problems identified:  The patient was referred for evaluation by his cardiology nurse practitioner since he had not been treated for diabetes previously  Also he has not been checking his blood sugars nor had any meal plan for diabetes  Since his renal function was normal and his blood sugars are modestly high he was started on metformin ER which he started taking 1500 mg a day without side effect  However he did not follow-up as scheduled  Also did not respond to calls for scheduling consultation with dietitian  He was given a prescription for metformin in July by cardiologist but he did not have a refill and is not taking  any medication for at least a month  Lab glucose recently is 107 and fructosamine 322  However his creatinine is 1.85        Typical meal intake: Breakfast is egg or cereal                Exercise: No formal exercise  Glucose monitoring:  None  Dietician visit, most recent: Never  Weight history:  Wt Readings from Last 3 Encounters:  01/24/19 180 lb (81.6 kg)  08/02/18 182 lb 1.9 oz (82.6 kg)  07/21/18 179 lb 12.8 oz (81.6 kg)    Glycemic control:   Lab Results  Component Value Date   HGBA1C 7.1 (H) 01/24/2019   HGBA1C 7.8 (A) 07/21/2018   HGBA1C 8.8 (H) 11/09/2016   Lab Results  Component Value Date   MICROALBUR 12.6 (H) 07/21/2018   LDLCALC 39 01/24/2019   CREATININE 1.85 (H) 03/10/2019   Lab Results  Component Value Date   MICRALBCREAT 10.0 07/21/2018    Lab Results  Component Value Date   FRUCTOSAMINE 322 (H) 03/10/2019    Lab on 03/10/2019  Component Date Value Ref Range Status  . Sodium 03/10/2019 139  135 - 145 mEq/L Final  . Potassium 03/10/2019 3.8  3.5 - 5.1 mEq/L Final  . Chloride 03/10/2019 107  96 - 112 mEq/L Final  . CO2 03/10/2019 27  19 - 32 mEq/L Final  . Glucose, Bld 03/10/2019  107* 70 - 99 mg/dL Final  . BUN 03/10/2019 36* 6 - 23 mg/dL Final  . Creatinine, Ser 03/10/2019 1.85* 0.40 - 1.50 mg/dL Final  . Calcium 03/10/2019 9.4  8.4 - 10.5 mg/dL Final  . GFR 03/10/2019 44.80* >60.00 mL/min Final  . Fructosamine 03/10/2019 322* 0 - 285 umol/L Final   Comment: Published reference interval for apparently healthy subjects between age 75 and 9 is 23 - 285 umol/L and in a poorly controlled diabetic population is 228 - 563 umol/L with a mean of 396 umol/L.     Allergies as of 03/14/2019   No Known Allergies     Medication List       Accurate as of March 13, 2019  9:08 PM. If you have any questions, ask your nurse or doctor.        aspirin 325 MG EC tablet Take 325 mg by mouth daily.   atorvastatin 80 MG tablet Commonly known  as: LIPITOR Take 1 tablet (80 mg total) by mouth daily.   carvedilol 25 MG tablet Commonly known as: COREG Take 1 tablet (25 mg total) by mouth 2 (two) times daily with a meal.   furosemide 40 MG tablet Commonly known as: LASIX TAKE 1 TABLET BY MOUTH ONCE DAILY   hydrALAZINE 50 MG tablet Commonly known as: APRESOLINE TAKE 1 TABLET BY MOUTH THREE TIMES DAILY   ibuprofen 200 MG tablet Commonly known as: ADVIL Take 200 mg by mouth every 6 (six) hours as needed for moderate pain (for knee).   isosorbide mononitrate 30 MG 24 hr tablet Commonly known as: IMDUR Take 1 tablet (30 mg total) by mouth daily.   losartan 100 MG tablet Commonly known as: COZAAR Take 1 tablet (100 mg total) by mouth daily.   metFORMIN 500 MG 24 hr tablet Commonly known as: GLUCOPHAGE-XR 3 TABS DAILY       Allergies: No Known Allergies  Past Medical History:  Diagnosis Date  . Arthritis    "left knee" (11/24/2017)  . CAD (coronary artery disease)    CABG 2002  . CHF (congestive heart failure) ([place])   . Chronic renal insufficiency   . Dilated cardiomyopathy ([place])    echo in 2009 showed improvement with a normal EF  . HTN (hypertension)   . Hyperlipemia   . Noncompliance   . PAD (peripheral artery disease) ([place])     Past Surgical History:  Procedure Laterality Date  . CARDIAC CATHETERIZATION  2002, 2006  . CORONARY ARTERY BYPASS GRAFT  2002   x4 DR. VAN TRIGT    Family History  Problem Relation Age of Onset  . Hypertension Father   . Diabetes Mother        DIABETIC COMA    Social History:  reports that he has never smoked. He has never used smokeless tobacco. He reports that he does not drink alcohol or use drugs.   Review of Systems  Endocrine: Positive for erectile dysfunction.     Lipid history: On atorvastatin since diagnosis of CAD, no recent follow-up available    Lab Results  Component Value Date   CHOL 93 (L) 01/24/2019   HDL 41 01/24/2019   LDLCALC 39 01/24/2019    LDLDIRECT 149.4 01/07/2011   TRIG 67 01/24/2019   CHOLHDL 2.3 01/24/2019           Hypertension: Has been present  BP Readings from Last 3 Encounters:  01/24/19 (!) 150/92  08/02/18 (!) 190/80  07/21/18 (!) 150/92  Most recent eye exam was in 2018  Most recent foot exam:  Currently known complications of diabetes:none  LABS:  Lab on 03/10/2019  Component Date Value Ref Range Status  . Sodium 03/10/2019 139  135 - 145 mEq/L Final  . Potassium 03/10/2019 3.8  3.5 - 5.1 mEq/L Final  . Chloride 03/10/2019 107  96 - 112 mEq/L Final  . CO2 03/10/2019 27  19 - 32 mEq/L Final  . Glucose, Bld 03/10/2019 107* 70 - 99 mg/dL Final  . BUN 03/10/2019 36* 6 - 23 mg/dL Final  . Creatinine, Ser 03/10/2019 1.85* 0.40 - 1.50 mg/dL Final  . Calcium 03/10/2019 9.4  8.4 - 10.5 mg/dL Final  . GFR 03/10/2019 44.80* >60.00 mL/min Final  . Fructosamine 03/10/2019 322* 0 - 285 umol/L Final   Comment: Published reference interval for apparently healthy subjects between age 40 and 32 is 25 - 285 umol/L and in a poorly controlled diabetic population is 228 - 563 umol/L with a mean of 396 umol/L.     Physical Examination:  There were no vitals taken for this visit.         ASSESSMENT:  Diabetes type 2, mild  See history of present illness for detailed discussion of current diabetes management, blood sugar patterns and problems identified  He has had diabetes for at least 2 years Although his A1c is better with metformin treatment he has not been taking any treatment for the last month as he ran out Surprisingly his nonfasting glucose in the lab was only 107  On the last A1c was 7.1 his fructosamine is above 300 and likely does not have consistent control Does need to start glucose monitoring which he has not Also has not had any diabetes education as recommended Also irregular with his follow-up  Since his creatinine is 1.85 would not be able to continue metformin May be a good  candidate for an oral GLP-1 drug if needed  PLAN:    1. Glucose monitoring: . Patient advised to be starting home glucose monitoring  He can come in to get a sample meter and will need to be instructed by the nurse educator . He will check check readings either fasting or 2 hours after meals at least every other day  2.  Diabetes education: Patient will need meal planning with nutritionist or diabetes educator  3.  Lifestyle changes: . He needs to start doing formal walking for exercise at least on his days off  4.  Medication changes: . He will need to stop metformin because of his significantly high creatinine . Need for alternative treatment will be determined based on his blood sugar levels at home and may have postprandial hyperglycemia which can be improved with consistent diet also  5.  Preventive care needed:  . Influenza vaccine should be done  6.  Follow-up: 2 months    There are no Patient Instructions on file for this visit.     Elayne Snare 03/13/2019, 9:08 PM   Note: This office note was prepared with Dragon voice recognition system technology. Any transcriptional errors that result from this process are unintentional.   Elayne Snare

## 2019-03-14 ENCOUNTER — Ambulatory Visit (INDEPENDENT_AMBULATORY_CARE_PROVIDER_SITE_OTHER): Payer: PRIVATE HEALTH INSURANCE | Admitting: Endocrinology

## 2019-03-14 ENCOUNTER — Other Ambulatory Visit: Payer: Self-pay

## 2019-03-14 ENCOUNTER — Encounter: Payer: Self-pay | Admitting: Endocrinology

## 2019-03-14 DIAGNOSIS — E1165 Type 2 diabetes mellitus with hyperglycemia: Secondary | ICD-10-CM

## 2019-03-14 DIAGNOSIS — Z794 Long term (current) use of insulin: Secondary | ICD-10-CM

## 2019-05-05 ENCOUNTER — Other Ambulatory Visit: Payer: Self-pay | Admitting: Nurse Practitioner

## 2019-05-10 ENCOUNTER — Other Ambulatory Visit: Payer: Self-pay | Admitting: Nurse Practitioner

## 2019-05-10 MED ORDER — FUROSEMIDE 40 MG PO TABS: 40.0000 mg | ORAL_TABLET | Freq: Every day | ORAL | 8 refills | Status: DC

## 2019-05-23 ENCOUNTER — Other Ambulatory Visit (INDEPENDENT_AMBULATORY_CARE_PROVIDER_SITE_OTHER): Payer: PRIVATE HEALTH INSURANCE

## 2019-05-23 ENCOUNTER — Other Ambulatory Visit: Payer: Self-pay

## 2019-05-23 DIAGNOSIS — E1165 Type 2 diabetes mellitus with hyperglycemia: Secondary | ICD-10-CM

## 2019-05-23 DIAGNOSIS — Z794 Long term (current) use of insulin: Secondary | ICD-10-CM | POA: Diagnosis not present

## 2019-05-23 LAB — COMPREHENSIVE METABOLIC PANEL
ALT: 13 U/L (ref 0–53)
AST: 17 U/L (ref 0–37)
Albumin: 3.9 g/dL (ref 3.5–5.2)
Alkaline Phosphatase: 147 U/L — ABNORMAL HIGH (ref 39–117)
BUN: 30 mg/dL — ABNORMAL HIGH (ref 6–23)
CO2: 26 mEq/L (ref 19–32)
Calcium: 9.4 mg/dL (ref 8.4–10.5)
Chloride: 107 mEq/L (ref 96–112)
Creatinine, Ser: 1.72 mg/dL — ABNORMAL HIGH (ref 0.40–1.50)
GFR: 48.7 mL/min — ABNORMAL LOW (ref >60.00)
Glucose, Bld: 149 mg/dL — ABNORMAL HIGH (ref 70–99)
Potassium: 3.5 mEq/L (ref 3.5–5.1)
Sodium: 138 mEq/L (ref 135–145)
Total Bilirubin: 0.7 mg/dL (ref 0.2–1.2)
Total Protein: 9.4 g/dL — ABNORMAL HIGH (ref 6.0–8.3)

## 2019-05-23 LAB — HEMOGLOBIN A1C: Hgb A1c MFr Bld: 7.9 % — ABNORMAL HIGH (ref 4.6–6.5)

## 2019-05-25 ENCOUNTER — Ambulatory Visit: Payer: PRIVATE HEALTH INSURANCE | Admitting: Endocrinology

## 2019-07-06 ENCOUNTER — Other Ambulatory Visit: Payer: Self-pay | Admitting: Nurse Practitioner

## 2019-07-10 ENCOUNTER — Telehealth: Payer: Self-pay

## 2019-07-10 NOTE — Telephone Encounter (Signed)
LMTCB to schedule.

## 2019-07-10 NOTE — Telephone Encounter (Signed)
-----   Message from Elayne Snare, MD sent at 07/09/2019  8:31 PM EST ----- Regarding: Needs follow-up Please schedule follow-up visit with labs for diabetes

## 2019-07-11 ENCOUNTER — Ambulatory Visit: Payer: PRIVATE HEALTH INSURANCE | Admitting: Dietician

## 2019-07-14 DIAGNOSIS — C9001 Multiple myeloma in remission: Secondary | ICD-10-CM

## 2019-07-14 DIAGNOSIS — I509 Heart failure, unspecified: Secondary | ICD-10-CM

## 2019-07-14 HISTORY — DX: Heart failure, unspecified: I50.9

## 2019-07-14 HISTORY — DX: Multiple myeloma in remission: C90.01

## 2019-07-21 NOTE — Progress Notes (Deleted)
CARDIOLOGY OFFICE NOTE  Date:  07/21/2019    [name] Date of Birth: [birth year] Medical Record #[id number]  PCP:  Patient, No Pcp Per  Cardiologist:  Servando Snare & [place]  No chief complaint on file.   History of Present Illness: [name] is a 64 y.o. male who presents today for a 6 month check.  Seen for Dr. [place]. He typically follows with me.   He has a history ofCAD s/p CABG 2002, HTN,dilated cardiomyopathy, hyperlipidemia and history of noncompliance with follow-up.  Echocardiogram in 2009 showed significant improvement in his LV function and EF was normal. Cardiac catheterization in 2006 demonstrated diseasebut he wasnot a candidate for further revascularization. Remote CTA abdomen showed 50% left renal artery stenosis in 2006 as well. Follow up renal duplex was ok.   I see him periodically. He tends to not keep his follow up. His medicines are usually not clear. He was admitted in 2019 - BP sky high and he was in heart failure - I got to meet his wife who had no idea of his non compliance. He has been found to be diabetic - took many visits to get him to see someone about this. BP has been up and down. Medicines still unclear. Last seen by me back in July - he was out of some of his medicines again. No cardiac complaints.    The patient {does/does not:200015} have symptoms concerning for COVID-19 infection (fever, chills, cough, or new shortness of breath).   Comes in today. Here with   Past Medical History:  Diagnosis Date  . Arthritis    "left knee" (11/24/2017)  . CAD (coronary artery disease)    CABG 2002  . CHF (congestive heart failure) ([place])   . Chronic renal insufficiency   . Dilated cardiomyopathy ([place])    echo in 2009 showed improvement with a normal EF  . HTN (hypertension)   . Hyperlipemia   . Noncompliance   . PAD (peripheral artery disease) ([place])     Past Surgical History:  Procedure Laterality Date  . CARDIAC CATHETERIZATION   2002, 2006  . CORONARY ARTERY BYPASS GRAFT  2002   x4 DR. VAN TRIGT     Medications: No outpatient medications have been marked as taking for the 07/26/19 encounter (Appointment) with Burtis Junes, NP.     Allergies: No Known Allergies  Social History: The patient  reports that he has never smoked. He has never used smokeless tobacco. He reports that he does not drink alcohol or use drugs.   Family History: The patient's ***family history includes Diabetes in his mother; Hypertension in his father.   Review of Systems: Please see the history of present illness.   All other systems are reviewed and negative.   Physical Exam: VS:  There were no vitals taken for this visit. [name]  BMI There is no height or weight on file to calculate BMI.  Wt Readings from Last 3 Encounters:  01/24/19 180 lb (81.6 kg)  08/02/18 182 lb 1.9 oz (82.6 kg)  07/21/18 179 lb 12.8 oz (81.6 kg)    General: Pleasant. Well developed, well nourished and in no acute distress.   HEENT: Normal.  Neck: Supple, no JVD, carotid bruits, or masses noted.  Cardiac: ***Regular rate and rhythm. No murmurs, rubs, or gallops. No edema.  Respiratory:  Lungs are clear to auscultation bilaterally with normal work of breathing.  GI: Soft and nontender.  MS: No deformity or atrophy. Gait and ROM  intact.  Skin: Warm and dry. Color is normal.  Neuro:  Strength and sensation are intact and no gross focal deficits noted.  Psych: Alert, appropriate and with normal affect.   LABORATORY DATA:  EKG:  EKG {ACTION; IS/IS [id number]:[id number]} ordered today. This demonstrates ***.  Lab Results  Component Value Date   WBC 7.0 01/24/2019   HGB 10.2 (L) 01/24/2019   HCT 30.4 (L) 01/24/2019   PLT 208 01/24/2019   GLUCOSE 149 (H) 05/23/2019   CHOL 93 (L) 01/24/2019   TRIG 67 01/24/2019   HDL 41 01/24/2019   LDLDIRECT 149.4 01/07/2011   LDLCALC 39 01/24/2019   ALT 13 05/23/2019   AST 17 05/23/2019   NA 138 05/23/2019   K 3.5  05/23/2019   CL 107 05/23/2019   CREATININE 1.72 (H) 05/23/2019   BUN 30 (H) 05/23/2019   CO2 26 05/23/2019   TSH 0.510 11/24/2017   INR 1.37 11/24/2017   HGBA1C 7.9 (H) 05/23/2019   MICROALBUR 12.6 (H) 07/21/2018     BNP (last 3 results) No results for input(s): BNP in the last 8760 hours.  ProBNP (last 3 results) No results for input(s): PROBNP in the last 8760 hours.   Other Studies Reviewed Today:  Echo 11/25/2017 LV EF: 50% - 55% Study Conclusions  - Left ventricle: The cavity size was normal. There was moderate concentric hypertrophy. Systolic function was normal. The estimated ejection fraction was in the range of 50% to 55%. Wall motion was normal; there were no regional wall motion abnormalities. Doppler parameters are consistent with abnormal left ventricular relaxation (grade 1 diastolic dysfunction). Doppler parameters are consistent with high ventricular filling pressure. - Aortic valve: Transvalvular velocity was within the normal range. There was no stenosis. There was no regurgitation. - Mitral valve: Transvalvular velocity was within the normal range. There was no evidence for stenosis. There was no regurgitation. - Left atrium: The atrium was moderately dilated. - Right ventricle: The cavity size was normal. Wall thickness was normal. Systolic function was normal. - Right atrium: The atrium was severely dilated. - Atrial septum: No defect or patent foramen ovale was identified by color flow Doppler. - Tricuspid valve: There was trivial regurgitation. - Pulmonary arteries: Systolic pressure was within the normal range. PA peak pressure: 36 mm Hg (S).   Assessment & Plan:  1. HTN  2. Chronic diastolic HF  3. Prior CM with improvement in EF  4. DM -   5. CAD  6. Long standing non compliance  7. HLD  8. COVID-19 Education: The signs and symptoms of COVID-19 were discussed with the patient and how to seek care  for testing (follow up with PCP or arrange E-visit).  The importance of social distancing, staying at home, hand hygiene and wearing a mask when out in public were discussed today.  Current medicines are reviewed with the patient today.  The patient does not have concerns regarding medicines other than what has been noted above.  The following changes have been made:  See above.  Labs/ tests ordered today include:   No orders of the defined types were placed in this encounter.    Disposition:   FU with *** in {gen number [id number]} {Days to years:10300}.   Patient is agreeable to this plan and will call if any problems develop in the interim.   SignedTruitt Merle, NP  07/21/2019 8:12 AM  [facility] [address] [place], [place]  [address] Phone: [phone])  [phone] Fax: [phone]

## 2019-07-26 ENCOUNTER — Ambulatory Visit: Payer: PRIVATE HEALTH INSURANCE | Admitting: Nurse Practitioner

## 2019-07-27 NOTE — Telephone Encounter (Signed)
LMTCB to schedule.

## 2019-07-28 NOTE — Progress Notes (Deleted)
CARDIOLOGY OFFICE NOTE  Date:  07/31/2019    [name] Date of Birth: 03/08/1956 Medical Record #[id number]  PCP:  Patient, No Pcp Per  Cardiologist:  Servando Snare & [place]   No chief complaint on file.   History of Present Illness: [name] is a 64 y.o. male who presents today for a follow up visit. Seen for Dr. [place]. He typically follows with me.   He has a history ofCAD s/p CABG 2002, HTN,dilated cardiomyopathy, hyperlipidemia and history of noncompliance with follow-up. Echocardiogram in 2009 showed significant improvement in his LV function and EF was normal. Cardiac catheterization in 2006 demonstrated diseasebut he wasnot a candidate for further revascularization. Remote CTA abdomen showed 50% left renal artery stenosis in 2006 as well. Follow up renal duplex was ok.   I saw him back in May of 2019 - after a prolonged absence - BPsky high. He was in heart failure. He was not taking his medicines correctly - I met his wife who was very much unaware of what Jamile was doing - he was telling her a totally different story that was not correct.He ended up being admitted.He was started on Entresto, his carvedilol was increased to 12.5 mg twice daily. He was aggressively diuresed with 80 mg twice daily of Lasix. Echocardiogram obtained on 11/25/2017 showed EF 50 to 55%, grade 1 DD, moderate LVH, moderately dilated left atrium, severely dilated right atrium, peak PA pressure 36 mmHg. His discharge weight was 182 pounds which was down from 207 pounds. He was eventually discharged on 40 mg daily of Lasix with 20 mEq of potassium daily. He was instructed to take additional dose of Lasix on a as needed basis if weight increase by more than 2 to 3 pounds per day.  He saw [name] in June -BP borderline. Had not been given his potassium. Entresto was increased.I then saw him in July - was still off his potassium.He was feeling good. His lab show profound elevation in  his glucose - most likely diabetic. He wouldnot returnourphone calls to discuss/refer.We were finally able to convince him to see Endocrine for his diabetes. Since, we have had to increase the Hydralazine. Last seen in July - again, medicines unclear and he was again out of several.   The patient {does/does not:200015} have symptoms concerning for COVID-19 infection (fever, chills, cough, or new shortness of breath).   Comes in today. Here with   Past Medical History:  Diagnosis Date  . Arthritis    "left knee" (11/24/2017)  . CAD (coronary artery disease)    CABG 2002  . CHF (congestive heart failure) ([place])   . Chronic renal insufficiency   . Dilated cardiomyopathy ([place])    echo in 2009 showed improvement with a normal EF  . HTN (hypertension)   . Hyperlipemia   . Noncompliance   . PAD (peripheral artery disease) ([place])     Past Surgical History:  Procedure Laterality Date  . CARDIAC CATHETERIZATION  2002, 2006  . CORONARY ARTERY BYPASS GRAFT  2002   x4 DR. VAN TRIGT     Medications: No outpatient medications have been marked as taking for the 08/01/19 encounter (Appointment) with Burtis Junes, NP.     Allergies: No Known Allergies  Social History: The patient  reports that he has never smoked. He has never used smokeless tobacco. He reports that he does not drink alcohol or use drugs.   Family History: The patient's ***family history includes Diabetes in his mother;  Hypertension in his father.   Review of Systems: Please see the history of present illness.   All other systems are reviewed and negative.   Physical Exam: VS:  There were no vitals taken for this visit. [name]  BMI There is no height or weight on file to calculate BMI.  Wt Readings from Last 3 Encounters:  01/24/19 180 lb (81.6 kg)  08/02/18 182 lb 1.9 oz (82.6 kg)  07/21/18 179 lb 12.8 oz (81.6 kg)    General: Pleasant. Well developed, well nourished and in no acute distress.   HEENT: Normal.    Neck: Supple, no JVD, carotid bruits, or masses noted.  Cardiac: ***Regular rate and rhythm. No murmurs, rubs, or gallops. No edema.  Respiratory:  Lungs are clear to auscultation bilaterally with normal work of breathing.  GI: Soft and nontender.  MS: No deformity or atrophy. Gait and ROM intact.  Skin: Warm and dry. Color is normal.  Neuro:  Strength and sensation are intact and no gross focal deficits noted.  Psych: Alert, appropriate and with normal affect.   LABORATORY DATA:  EKG:  EKG {ACTION; IS/IS TWS:[id number]} ordered today. This demonstrates ***.  Lab Results  Component Value Date   WBC 7.0 01/24/2019   HGB 10.2 (L) 01/24/2019   HCT 30.4 (L) 01/24/2019   PLT 208 01/24/2019   GLUCOSE 149 (H) 05/23/2019   CHOL 93 (L) 01/24/2019   TRIG 67 01/24/2019   HDL 41 01/24/2019   LDLDIRECT 149.4 01/07/2011   LDLCALC 39 01/24/2019   ALT 13 05/23/2019   AST 17 05/23/2019   NA 138 05/23/2019   K 3.5 05/23/2019   CL 107 05/23/2019   CREATININE 1.72 (H) 05/23/2019   BUN 30 (H) 05/23/2019   CO2 26 05/23/2019   TSH 0.510 11/24/2017   INR 1.37 11/24/2017   HGBA1C 7.9 (H) 05/23/2019   MICROALBUR 12.6 (H) 07/21/2018     BNP (last 3 results) No results for input(s): BNP in the last 8760 hours.  ProBNP (last 3 results) No results for input(s): PROBNP in the last 8760 hours.   Other Studies Reviewed Today:  Echo 11/25/2017 LV EF: 50% - 55% Study Conclusions  - Left ventricle: The cavity size was normal. There was moderate concentric hypertrophy. Systolic function was normal. The estimated ejection fraction was in the range of 50% to 55%. Wall motion was normal; there were no regional wall motion abnormalities. Doppler parameters are consistent with abnormal left ventricular relaxation (grade 1 diastolic dysfunction). Doppler parameters are consistent with high ventricular filling pressure. - Aortic valve: Transvalvular velocity was within the normal  range. There was no stenosis. There was no regurgitation. - Mitral valve: Transvalvular velocity was within the normal range. There was no evidence for stenosis. There was no regurgitation. - Left atrium: The atrium was moderately dilated. - Right ventricle: The cavity size was normal. Wall thickness was normal. Systolic function was normal. - Right atrium: The atrium was severely dilated. - Atrial septum: No defect or patent foramen ovale was identified by color flow Doppler. - Tricuspid valve: There was trivial regurgitation. - Pulmonary arteries: Systolic pressure was within the normal range. PA peak pressure: 36 mm Hg (S).   Assessment & Plan:  1.HTN -etter by me today. I have left him on his current regimen for now.   2.Chronic diastolic HF -has better BP control. Reminded about salt restriction.   3. Diabetes -he has seen Endocrine but has not had his follow up. I have sent in  his refill for his Metformin - he understands that he needs to see Dr. [name] going forward for further refills.  4. CAD - no active chest pain. No changes made today. Refilled his Imdur - he has been out of this.   5.Long standing non compliance - still somewhat iffy but better than he has been in the past.   6. HLD - on statin - lab today.   [name] COVID-19 Education: The signs and symptoms of COVID-19 were discussed with the patient and how to seek care for testing (follow up with PCP or arrange E-visit).  The importance of social distancing, staying at home, hand hygiene and wearing a mask when out in public were discussed today.  Current medicines are reviewed with the patient today.  The patient does not have concerns regarding medicines other than what has been noted above.  The following changes have been made:  See above.  Labs/ tests ordered today include:   No orders of the defined types were placed in this encounter.    Disposition:   FU with *** in {gen number  [id number]:[id number]} {Days to years:10300}.   Patient is agreeable to this plan and will call if any problems develop in the interim.   SignedTruitt Merle, NP  07/31/2019 12:43 PM  [facility] [address] [place], [place]  [address] Phone: [phone] Fax: [phone]

## 2019-08-01 ENCOUNTER — Ambulatory Visit: Payer: PRIVATE HEALTH INSURANCE | Admitting: Nurse Practitioner

## 2019-08-02 NOTE — Telephone Encounter (Signed)
3rd attempt to contact patient-sending letter out

## 2019-08-17 ENCOUNTER — Encounter: Payer: Self-pay | Admitting: Dietician

## 2019-08-17 ENCOUNTER — Encounter: Payer: PRIVATE HEALTH INSURANCE | Attending: Endocrinology | Admitting: Dietician

## 2019-08-17 ENCOUNTER — Other Ambulatory Visit: Payer: Self-pay

## 2019-08-17 DIAGNOSIS — E1165 Type 2 diabetes mellitus with hyperglycemia: Secondary | ICD-10-CM | POA: Diagnosis present

## 2019-08-17 NOTE — Progress Notes (Signed)
Diabetes Self-Management Education  Visit Type: First/Initial  Appt. Start Time: 1030 Appt. End Time: 1200  08/18/2019  [name], identified by name and date of birth, is a 64 y.o. male with a diagnosis of Diabetes: Type 2.   ASSESSMENT History includes Type 2 Diabetes dx in 2018, CAD, CABG 2002, CHF, HTN, HLD, PAD, GFR 48 05/2019 A1C 7.9% 05/2019 increased from 7.1% 01/2019  He is not checking his BG. Provided patient with an Accu-Chek Guide Me BG meter, Exp 09/13/2020, Lot [id number].  He was instructed and could demonstrate it's use.  BG 168 4-5 hours after breakfast. His hands are thick and has increased arthritis.  Dexterity concerns.  He states that his wife can help.  Will evaluate at next visit how he is doing with this and need for an alternative BG meter.  Patient lives with his wife and she does the shopping and cooking.   He works first shift at [employer].  He states that he walks a lot at work.  He does not get any other exercise.   Height 5' 9.5" (1.765 m), weight 176 lb (79.8 kg). Body mass index is 25.62 kg/m.  Diabetes Self-Management Education - 08/17/19 1055      Visit Information   Visit Type  First/Initial      Initial Visit   Diabetes Type  Type 2    Are you currently following a meal plan?  No    Are you taking your medications as prescribed?  Not on Medications    Date Diagnosed  2018      Health Coping   How would you rate your overall health?  Fair      Psychosocial Assessment   Patient Belief/Attitude about Diabetes  Motivated to manage diabetes    Self-care barriers  None    Self-management support  Doctor's office;Family    Other persons present  Patient    Patient Concerns  Nutrition/Meal planning    Special Needs  None    Preferred Learning Style  No preference indicated    Learning Readiness  Ready    How often do you need to have someone help you when you read instructions, pamphlets, or other written materials from your  doctor or pharmacy?  1 - Never    What is the last grade level you completed in school?  12th grade      Pre-Education Assessment   Patient understands the diabetes disease and treatment process.  Needs Instruction    Patient understands incorporating nutritional management into lifestyle.  Needs Instruction    Patient undertands incorporating physical activity into lifestyle.  Needs Instruction    Patient understands using medications safely.  Needs Instruction    Patient understands monitoring blood glucose, interpreting and using results  Needs Instruction    Patient understands prevention, detection, and treatment of acute complications.  Needs Instruction    Patient understands prevention, detection, and treatment of chronic complications.  Needs Instruction    Patient understands how to develop strategies to address psychosocial issues.  Needs Instruction    Patient understands how to develop strategies to promote health/change behavior.  Needs Instruction      Complications   Last HgB A1C per patient/outside source  7.9 %   05/23/2019 increased from 7.1% 01/2019   How often do you check your blood sugar?  0 times/day (not testing)    Have you had a dilated eye exam in the past 12 months?  No  Have you had a dental exam in the past 12 months?  No    Are you checking your feet?  Yes    How many days per week are you checking your feet?  7      Dietary Intake   Breakfast  toast with lite margarine, 8 oz OJ   "not much of a breakfast eater"   Snack (morning)  cookies    Lunch  leftovers    Dinner  hamburger steak with gravy, mashed potatoes, gatorade    Snack (evening)  --   "working to decrease this"   Beverage(s)  water, OJ, G2 gatorade, regular hot chocolate      Exercise   Exercise Type  Light (walking / raking leaves)   at work     Patient Education   Previous Diabetes Education  No    Nutrition management   Role of diet in the treatment of diabetes and the  relationship between the three main macronutrients and blood glucose level;Food label reading, portion sizes and measuring food.;Meal options for control of blood glucose level and chronic complications.    Physical activity and exercise   Role of exercise on diabetes management, blood pressure control and cardiac health.    Monitoring  Taught/evaluated SMBG meter.;Purpose and frequency of SMBG.;Identified appropriate SMBG and/or A1C goals.;Daily foot exams;Yearly dilated eye exam      Individualized Goals (developed by patient)   Nutrition  General guidelines for healthy choices and portions discussed    Physical Activity  Exercise 5-7 days per week;30 minutes per day    Medications  Not Applicable    Monitoring   test my blood glucose as discussed    Reducing Risk  increase portions of healthy fats;do foot checks daily    Health Coping  discuss diabetes with (comment)   MD, RD, CDE     Post-Education Assessment   Patient understands the diabetes disease and treatment process.  Demonstrates understanding / competency    Patient understands incorporating nutritional management into lifestyle.  Needs Review    Patient undertands incorporating physical activity into lifestyle.  Needs Review    Patient understands using medications safely.  Needs Review    Patient understands monitoring blood glucose, interpreting and using results  Needs Review    Patient understands prevention, detection, and treatment of acute complications.  Needs Review    Patient understands prevention, detection, and treatment of chronic complications.  Needs Review    Patient understands how to develop strategies to address psychosocial issues.  Demonstrates understanding / competency    Patient understands how to develop strategies to promote health/change behavior.  Needs Review      Outcomes   Expected Outcomes  Other (comment)   demonstrated interest in learning, barriers and question changes   Future DMSE  2  months    Program Status  Not Completed       Individualized Plan for Diabetes Self-Management Training:   Learning Objective:  Patient will have a greater understanding of diabetes self-management. Patient education plan is to attend individual and/or group sessions per assessed needs and concerns.   Plan:   Patient Instructions  Make an eye appointment. Stay hydrated.  Be sure to drink enough water throughout the day. Choose beverages that do not have carbohydrate. Consider eating a fresh fruit rather than drinking juice. Consider walking or other exercise on your days off.   Add non starchy vegetables to lunch and dinner.  Aim for 3-4 Carb Choices per  meal (45-60 grams) +/- 1 either way  Aim for 0-1 Carbs per snack if hungry  Include protein in moderation with your meals and snacks Consider reading food labels for Total Carbohydrate of foods Consider  increasing your activity level by walking for 30 minutes daily as tolerated Consider checking BG at alternate times per day       Expected Outcomes:  Other (comment)(demonstrated interest in learning, barriers and question changes)  Education material provided: ADA - How to Thrive: A Guide for Your Journey with Diabetes, Meal plan card and Snack sheet  If problems or questions, patient to contact team via:  Phone and Email  Future DSME appointment: 2 months

## 2019-08-17 NOTE — Patient Instructions (Addendum)
Make an eye appointment. Stay hydrated.  Be sure to drink enough water throughout the day. Choose beverages that do not have carbohydrate. Consider eating a fresh fruit rather than drinking juice. Consider walking or other exercise on your days off.   Add non starchy vegetables to lunch and dinner.  Aim for 3-4 Carb Choices per meal (45-60 grams) +/- 1 either way  Aim for 0-1 Carbs per snack if hungry  Include protein in moderation with your meals and snacks Consider reading food labels for Total Carbohydrate of foods Consider  increasing your activity level by walking for 30 minutes daily as tolerated Consider checking BG at alternate times per day

## 2019-08-18 ENCOUNTER — Other Ambulatory Visit: Payer: Self-pay

## 2019-08-18 MED ORDER — ACCU-CHEK GUIDE VI STRP: ORAL_STRIP | 2 refills | Status: DC

## 2019-08-18 MED ORDER — ACCU-CHEK FASTCLIX LANCETS MISC: 2 refills | Status: DC

## 2019-08-31 NOTE — Progress Notes (Signed)
CARDIOLOGY OFFICE NOTE  Date:  09/04/2019    [name] Date of Birth: 1955-08-18 Medical Record #[id number]  PCP:  Patient, No Pcp Per  Cardiologist:  Servando Snare & [place]  Chief Complaint  Patient presents with  . Follow-up    Seen for Dr. [place]    History of Present Illness: [name] is a 64 y.o. male who presents today for a follow up vsiit. Seen for Dr. [place]. He typically follows with me.   He has a history ofCAD s/p CABG 2002, HTN,dilated cardiomyopathy, hyperlipidemia and long standing noncompliance with follow-up/meds. Echocardiogram in 2009 showed significant improvement in his LV function and EF was normal. Cardiac catheterization in 2006 demonstrated diseasebut he wasnot a candidate for further revascularization. Remote CTA abdomen showed 50% left renal artery stenosis in 2006 as well. Follow up renal duplex was ok.   I have followed him for many years - he tends to go long periods between visits despite our recommendations. He ended up being admitted in May of 2019 with marked HTN and CHF requiring aggressive diuresis. Echo showed EF of 50 to 55% with DD. He has been found to have diabetes - took several visits to convince him to see Endocrine. His medicines always tend to be unclear as to what he is exactly taking. Last seen in July - was again, out of some medicines, had missed follow up with Dr. [name].   The patient does not have symptoms concerning for COVID-19 infection (fever, chills, cough, or new shortness of breath).   Comes in today. Here alone. He says he is doing ok. Has not kept his diabetic follow up - no longer on Metformin due to his kidney's - has not been back since September for further management - has a visit next month. Has not been sick. No chest pain. Breathing is ok. He has lost weight - he is not sure how or why. He says he has seen the nutritionist - he thinks he may be eating better. He tells me he is taking his medicines  and has not ran out of anything.   Past Medical History:  Diagnosis Date  . Arthritis    "left knee" (11/24/2017)  . CAD (coronary artery disease)    CABG 2002  . CHF (congestive heart failure) ([place])   . Chronic renal insufficiency   . Diabetes mellitus without complication ([place])   . Dilated cardiomyopathy ([place])    echo in 2009 showed improvement with a normal EF  . HTN (hypertension)   . Hyperlipemia   . Noncompliance   . PAD (peripheral artery disease) ([place])     Past Surgical History:  Procedure Laterality Date  . CARDIAC CATHETERIZATION  2002, 2006  . CORONARY ARTERY BYPASS GRAFT  2002   x4 DR. VAN TRIGT     Medications: Current Meds  Medication Sig  . Accu-Chek FastClix Lancets MISC Use Accu Chek fastclix lancets to check blood sugar fasting or 2 hours after a meal every other day.  [name] aspirin 325 MG EC tablet Take 325 mg by mouth daily.   [name] atorvastatin (LIPITOR) 80 MG tablet Take 1 tablet (80 mg total) by mouth daily.  . Blood Glucose Monitoring Suppl (ACCU-CHEK GUIDE ME) w/Device KIT 1 each by Does not apply route.  . carvedilol (COREG) 25 MG tablet Take 1 tablet (25 mg total) by mouth 2 (two) times daily with a meal.  . furosemide (LASIX) 40 MG tablet Take 1 tablet (40 mg total) by  mouth daily.  [name] glucose blood (ACCU-CHEK GUIDE) test strip Use Accu Chek test strips as instructed to check blood sugar fasting or two hours after meals, every other day.  . hydrALAZINE (APRESOLINE) 50 MG tablet TAKE 1 TABLET BY MOUTH THREE TIMES DAILY  . ibuprofen (ADVIL,MOTRIN) 200 MG tablet Take 200 mg by mouth every 6 (six) hours as needed for moderate pain (for knee).  . isosorbide mononitrate (IMDUR) 30 MG 24 hr tablet Take 1 tablet (30 mg total) by mouth daily.  [name] losartan (COZAAR) 100 MG tablet Take 1 tablet by mouth once daily     Allergies: No Known Allergies  Social History: The patient  reports that he has never smoked. He has never used smokeless tobacco. He reports that  he does not drink alcohol or use drugs.   Family History: The patient's family history includes Diabetes in his mother; Hypertension in his father.   Review of Systems: Please see the history of present illness.   All other systems are reviewed and negative.   Physical Exam: VS:  BP (!) 158/80   Pulse 71   Ht 5' 9.5" (1.765 m)   Wt 167 lb (75.8 kg)   SpO2 98%   BMI 24.31 kg/m  .  BMI Body mass index is 24.31 kg/m.  Wt Readings from Last 3 Encounters:  09/04/19 167 lb (75.8 kg)  08/17/19 176 lb (79.8 kg)  01/24/19 180 lb (81.6 kg)    General: Alert. He is in no acute distress.  He looks much older to me today. Weight is down significantly - down 13 pounds since July.  HEENT: Normal.  Neck: Supple, no JVD, carotid bruits, or masses noted.  Cardiac: Regular rate and rhythm with rare ectopic. No edema.  Respiratory:  Lungs are clear to auscultation bilaterally with normal work of breathing.  GI: Soft and nontender.  MS: No deformity or atrophy. Gait and ROM intact.  Skin: Warm and dry. Color is normal.  Neuro:  Strength and sensation are intact and no gross focal deficits noted.  Psych: Alert, appropriate and with normal affect.   LABORATORY DATA:  EKG:  EKG is not ordered today.   Lab Results  Component Value Date   WBC 7.0 01/24/2019   HGB 10.2 (L) 01/24/2019   HCT 30.4 (L) 01/24/2019   PLT 208 01/24/2019   GLUCOSE 149 (H) 05/23/2019   CHOL 93 (L) 01/24/2019   TRIG 67 01/24/2019   HDL 41 01/24/2019   LDLDIRECT 149.4 01/07/2011   LDLCALC 39 01/24/2019   ALT 13 05/23/2019   AST 17 05/23/2019   NA 138 05/23/2019   K 3.5 05/23/2019   CL 107 05/23/2019   CREATININE 1.72 (H) 05/23/2019   BUN 30 (H) 05/23/2019   CO2 26 05/23/2019   TSH 0.510 11/24/2017   INR 1.37 11/24/2017   HGBA1C 7.9 (H) 05/23/2019   MICROALBUR 12.6 (H) 07/21/2018     BNP (last 3 results) No results for input(s): BNP in the last 8760 hours.  ProBNP (last 3 results) No results for  input(s): PROBNP in the last 8760 hours.   Other Studies Reviewed Today:  Echo 11/25/2017 LV EF: 50% - 55% Study Conclusions  - Left ventricle: The cavity size was normal. There was moderate concentric hypertrophy. Systolic function was normal. The estimated ejection fraction was in the range of 50% to 55%. Wall motion was normal; there were no regional wall motion abnormalities. Doppler parameters are consistent with abnormal left ventricular relaxation (grade 1  diastolic dysfunction). Doppler parameters are consistent with high ventricular filling pressure. - Aortic valve: Transvalvular velocity was within the normal range. There was no stenosis. There was no regurgitation. - Mitral valve: Transvalvular velocity was within the normal range. There was no evidence for stenosis. There was no regurgitation. - Left atrium: The atrium was moderately dilated. - Right ventricle: The cavity size was normal. Wall thickness was normal. Systolic function was normal. - Right atrium: The atrium was severely dilated. - Atrial septum: No defect or patent foramen ovale was identified by color flow Doppler. - Tricuspid valve: There was trivial regurgitation. - Pulmonary arteries: Systolic pressure was within the normal range. PA peak pressure: 36 mm Hg (S).   Assessment & Plan:  1. HTN - repeat by me is 160/80 - adding Norvasc 5 mg to his regimen.   2. Chronic diastolic HF - needs good BP control - he has lost weight.   3. Diabetes - I suspect this is uncontrolled - seeing Endocrine next month.   4. CAD - he has no active symptoms - needs aggressive risk factor modification.   5. HLD - on statin - lab today.   6. Long standing medication non compliance - I am still not convinced he is taking everything - trying to add Norvasc today.   7. Weight loss - unclear etiology. Checking lab today.   8. COVID-19 Education: The signs and symptoms of COVID-19 were  discussed with the patient and how to seek care for testing (follow up with PCP or arrange E-visit).  The importance of social distancing, staying at home, hand hygiene and wearing a mask when out in public were discussed today.  Current medicines are reviewed with the patient today.  The patient does not have concerns regarding medicines other than what has been noted above.  The following changes have been made:  See above.  Labs/ tests ordered today include:    Orders Placed This Encounter  Procedures  . Basic metabolic panel  . CBC  . Hepatic function panel  . Hemoglobin A1c  . Lipid panel  . TSH     Disposition:   FU with me in 6 weeks.   Patient is agreeable to this plan and will call if any problems develop in the interim.   SignedTruitt Merle, NP  09/04/2019 4:31 PM  [facility] [address] [place], [place]  [address] Phone: [phone] Fax: [phone]

## 2019-09-04 ENCOUNTER — Ambulatory Visit (INDEPENDENT_AMBULATORY_CARE_PROVIDER_SITE_OTHER): Payer: PRIVATE HEALTH INSURANCE | Admitting: Nurse Practitioner

## 2019-09-04 ENCOUNTER — Other Ambulatory Visit: Payer: Self-pay

## 2019-09-04 ENCOUNTER — Encounter: Payer: Self-pay | Admitting: Nurse Practitioner

## 2019-09-04 VITALS — BP 158/80 | HR 71 | Ht 69.5 in | Wt 167.0 lb

## 2019-09-04 DIAGNOSIS — I10 Essential (primary) hypertension: Secondary | ICD-10-CM

## 2019-09-04 DIAGNOSIS — I5032 Chronic diastolic (congestive) heart failure: Secondary | ICD-10-CM | POA: Diagnosis not present

## 2019-09-04 DIAGNOSIS — I259 Chronic ischemic heart disease, unspecified: Secondary | ICD-10-CM | POA: Diagnosis not present

## 2019-09-04 DIAGNOSIS — R634 Abnormal weight loss: Secondary | ICD-10-CM | POA: Diagnosis not present

## 2019-09-04 DIAGNOSIS — E1169 Type 2 diabetes mellitus with other specified complication: Secondary | ICD-10-CM

## 2019-09-04 NOTE — Patient Instructions (Addendum)
After Visit Summary:  We will be checking the following labs today - BMET, CBC, HPF, Lipids, TSH and A1C   Medication Instructions:    Continue with your current medicines. BUT  I am adding Norvasc 5 mg to take one a day - this is in addition to all your other blood pressure medicines.    If you need a refill on your cardiac medications before your next appointment, please call your pharmacy.     Testing/Procedures To Be Arranged:  N/A  Follow-Up:   See me in about 6 weeks.     At [facility], you and your health needs are our priority.  As part of our continuing mission to provide you with exceptional heart care, we have created designated Provider Care Teams.  These Care Teams include your primary Cardiologist (physician) and Advanced Practice Providers (APPs -  Physician Assistants and Nurse Practitioners) who all work together to provide you with the care you need, when you need it.  Special Instructions:  . Stay safe, stay home, wash your hands for at least 20 seconds and wear a mask when out in public.  . It was good to talk with you today.  [name] Keep your follow up next month with Dr. [name]   Call the [place] office at [phone] if you have any questions, problems or concerns.

## 2019-09-05 LAB — BASIC METABOLIC PANEL
BUN/Creatinine Ratio: 24 (ref 10–24)
BUN: 64 mg/dL — ABNORMAL HIGH (ref 8–27)
CO2: 15 mmol/L — ABNORMAL LOW (ref 20–29)
Calcium: 9.3 mg/dL (ref 8.6–10.2)
Chloride: 106 mmol/L (ref 96–106)
Creatinine, Ser: 2.62 mg/dL — ABNORMAL HIGH (ref 0.76–1.27)
GFR calc Af Amer: 29 mL/min/{1.73_m2} — ABNORMAL LOW (ref >59)
GFR calc non Af Amer: 25 mL/min/{1.73_m2} — ABNORMAL LOW (ref >59)
Glucose: 129 mg/dL — ABNORMAL HIGH (ref 65–99)
Potassium: 4.1 mmol/L (ref 3.5–5.2)
Sodium: 137 mmol/L (ref 134–144)

## 2019-09-05 LAB — CBC
Hematocrit: 29.8 % — ABNORMAL LOW (ref 37.5–51.0)
Hemoglobin: 9.8 g/dL — ABNORMAL LOW (ref 13.0–17.7)
MCH: 26.9 pg (ref 26.6–33.0)
MCHC: 32.9 g/dL (ref 31.5–35.7)
MCV: 82 fL (ref 79–97)
Platelets: 309 10*3/uL (ref 150–450)
RBC: 3.64 x10E6/uL — ABNORMAL LOW (ref 4.14–5.80)
RDW: 14.5 % (ref 11.6–15.4)
WBC: 7 10*3/uL (ref 3.4–10.8)

## 2019-09-05 LAB — HEPATIC FUNCTION PANEL
ALT: 45 IU/L — ABNORMAL HIGH (ref 0–44)
AST: 30 IU/L (ref 0–40)
Albumin: 3.6 g/dL — ABNORMAL LOW (ref 3.8–4.8)
Alkaline Phosphatase: 163 IU/L — ABNORMAL HIGH (ref 39–117)
Bilirubin Total: 0.4 mg/dL (ref 0.0–1.2)
Bilirubin, Direct: 0.11 mg/dL (ref 0.00–0.40)
Total Protein: 10 g/dL — ABNORMAL HIGH (ref 6.0–8.5)

## 2019-09-05 LAB — LIPID PANEL
Chol/HDL Ratio: 2.6 ratio (ref 0.0–5.0)
Cholesterol, Total: 92 mg/dL — ABNORMAL LOW (ref 100–199)
HDL: 35 mg/dL — ABNORMAL LOW (ref >39)
LDL Chol Calc (NIH): 39 mg/dL (ref 0–99)
Triglycerides: 89 mg/dL (ref 0–149)
VLDL Cholesterol Cal: 18 mg/dL (ref 5–40)

## 2019-09-05 LAB — TSH: TSH: 0.592 u[IU]/mL (ref 0.450–4.500)

## 2019-09-05 LAB — HEMOGLOBIN A1C
Est. average glucose Bld gHb Est-mCnc: 177 mg/dL
Hgb A1c MFr Bld: 7.8 % — ABNORMAL HIGH (ref 4.8–5.6)

## 2019-09-06 ENCOUNTER — Telehealth: Payer: Self-pay | Admitting: Hematology

## 2019-09-06 ENCOUNTER — Other Ambulatory Visit: Payer: Self-pay | Admitting: *Deleted

## 2019-09-06 DIAGNOSIS — N184 Chronic kidney disease, stage 4 (severe): Secondary | ICD-10-CM

## 2019-09-06 DIAGNOSIS — R778 Other specified abnormalities of plasma proteins: Secondary | ICD-10-CM

## 2019-09-06 NOTE — Telephone Encounter (Signed)
Received a new hem referral from Dr. Servando Snare for elevated protein. [name] has been cld and scheduled to see Dr. Irene Limbo on 3/17 at 10am. Pt has been made aware to arrive 15 minutes early.

## 2019-09-08 ENCOUNTER — Telehealth: Payer: Self-pay | Admitting: Endocrinology

## 2019-09-08 NOTE — Telephone Encounter (Signed)
-----   Message from Oval Linsey sent at 09/07/2019  2:27 PM EST ----- Regarding: FW: Labs appointment LMTCB to schedule labs for upcoming appt. ----- Message ----- From: Elayne Snare, MD Sent: 09/07/2019   1:00 PM EST To: [facility] Admin Pool Subject: Labs appointment                               He needs to be scheduled for nonfasting labs before his upcoming visit, please call

## 2019-09-14 ENCOUNTER — Ambulatory Visit: Payer: PRIVATE HEALTH INSURANCE | Admitting: Endocrinology

## 2019-09-19 ENCOUNTER — Other Ambulatory Visit: Payer: PRIVATE HEALTH INSURANCE

## 2019-09-19 ENCOUNTER — Other Ambulatory Visit: Payer: Self-pay | Admitting: Endocrinology

## 2019-09-19 ENCOUNTER — Other Ambulatory Visit: Payer: Self-pay

## 2019-09-19 DIAGNOSIS — Z794 Long term (current) use of insulin: Secondary | ICD-10-CM

## 2019-09-19 DIAGNOSIS — E1165 Type 2 diabetes mellitus with hyperglycemia: Secondary | ICD-10-CM

## 2019-09-21 ENCOUNTER — Other Ambulatory Visit: Payer: Self-pay

## 2019-09-21 ENCOUNTER — Ambulatory Visit: Payer: PRIVATE HEALTH INSURANCE | Admitting: Dietician

## 2019-09-21 ENCOUNTER — Encounter: Payer: Self-pay | Admitting: Endocrinology

## 2019-09-21 ENCOUNTER — Ambulatory Visit (INDEPENDENT_AMBULATORY_CARE_PROVIDER_SITE_OTHER): Payer: PRIVATE HEALTH INSURANCE | Admitting: Endocrinology

## 2019-09-21 VITALS — BP 134/70 | HR 81 | Ht 69.5 in | Wt 166.2 lb

## 2019-09-21 DIAGNOSIS — E1165 Type 2 diabetes mellitus with hyperglycemia: Secondary | ICD-10-CM

## 2019-09-21 DIAGNOSIS — Z794 Long term (current) use of insulin: Secondary | ICD-10-CM

## 2019-09-21 LAB — GLUCOSE, POCT (MANUAL RESULT ENTRY): POC Glucose: 147 mg/dl — AB (ref 70–99)

## 2019-09-21 MED ORDER — SITAGLIPTIN PHOSPHATE 25 MG PO TABS: 25.0000 mg | ORAL_TABLET | Freq: Every day | ORAL | 1 refills | Status: DC

## 2019-09-21 NOTE — Progress Notes (Signed)
Patient ID: [name], male   DOB: [birth year], 64 y.o.   MRN: [id number]           Reason for Appointment: Follow-up for Type 2 Diabetes  Referring : Truitt Merle   History of Present Illness:          Date of diagnosis of type 2 diabetes mellitus:   2018      Background history:  He has never been told to have diabetes but review of his previous labs indicate that he had high blood sugar 179 in 2014 His highest sugar has been 234 in 10/2016 when his A1c was 8.8, not clear if he was symptomatic at that time He was recommended referral for diabetes at that time but he did not make an appointment  Recent history:   A1c is 7.8 done in 2/20, lowest level 7.1, highest 8.8  Non-insulin hypoglycemic drugs the patient is taking are: None  Current management, blood sugar patterns and problems identified:  He had previously been prescribed Metformin but he did not take this as directed  Also since his blood sugars appear to be better on his last visit he was told to start glucose monitoring at home to establish his blood sugar patterns especially after meals  However despite instruction by the diabetes educator on how to use the meter he still has not used it and now says that he cannot make it turn on  Blood sugars in the lab are 147 and 129 recently  He appears to be losing weight and has been trying to cut back on portions as suggested by dietitian  He has not done any walking for exercise            Glucose monitoring:  None  Dietician visit, most recent: 2/21  Weight history:  Wt Readings from Last 3 Encounters:  09/21/19 166 lb 3.2 oz (75.4 kg)  09/04/19 167 lb (75.8 kg)  08/17/19 176 lb (79.8 kg)    Glycemic control:   Lab Results  Component Value Date   HGBA1C 7.8 (H) 09/04/2019   HGBA1C 7.9 (H) 05/23/2019   HGBA1C 7.1 (H) 01/24/2019   Lab Results  Component Value Date   MICROALBUR 12.6 (H) 07/21/2018   LDLCALC 39 09/04/2019   CREATININE 2.62 (H)  09/04/2019   Lab Results  Component Value Date   MICRALBCREAT 10.0 07/21/2018    Lab Results  Component Value Date   FRUCTOSAMINE 322 (H) 03/10/2019    Office Visit on 09/21/2019  Component Date Value Ref Range Status  . POC Glucose 09/21/2019 147* 70 - 99 mg/dl Final    Allergies as of 09/21/2019   No Known Allergies     Medication List       Accurate as of September 21, 2019  9:23 PM. If you have any questions, ask your nurse or doctor.        Accu-Chek FastClix Lancets Misc Use Accu Chek fastclix lancets to check blood sugar fasting or 2 hours after a meal every other day.   Accu-Chek Guide Me w/Device Kit 1 each by Does not apply route.   Accu-Chek Guide test strip Generic drug: glucose blood Use Accu Chek test strips as instructed to check blood sugar fasting or two hours after meals, every other day.   aspirin 325 MG EC tablet Take 325 mg by mouth daily.   atorvastatin 80 MG tablet Commonly known as: LIPITOR Take 1 tablet (80 mg total) by mouth daily.   carvedilol 25  MG tablet Commonly known as: COREG Take 1 tablet (25 mg total) by mouth 2 (two) times daily with a meal.   furosemide 40 MG tablet Commonly known as: LASIX Take 1 tablet (40 mg total) by mouth daily.   hydrALAZINE 50 MG tablet Commonly known as: APRESOLINE TAKE 1 TABLET BY MOUTH THREE TIMES DAILY   ibuprofen 200 MG tablet Commonly known as: ADVIL Take 200 mg by mouth every 6 (six) hours as needed for moderate pain (for knee).   isosorbide mononitrate 30 MG 24 hr tablet Commonly known as: IMDUR Take 1 tablet (30 mg total) by mouth daily.   losartan 100 MG tablet Commonly known as: COZAAR Take 1 tablet by mouth once daily   sitaGLIPtin 25 MG tablet Commonly known as: Januvia Take 1 tablet (25 mg total) by mouth daily. Started by: Elayne Snare, MD       Allergies: No Known Allergies  Past Medical History:  Diagnosis Date  . Arthritis    "left knee" (11/24/2017)  . CAD  (coronary artery disease)    CABG 2002  . CHF (congestive heart failure) ([place])   . Chronic renal insufficiency   . Diabetes mellitus without complication ([place])   . Dilated cardiomyopathy ([place])    echo in 2009 showed improvement with a normal EF  . HTN (hypertension)   . Hyperlipemia   . Noncompliance   . PAD (peripheral artery disease) ([place])     Past Surgical History:  Procedure Laterality Date  . CARDIAC CATHETERIZATION  2002, 2006  . CORONARY ARTERY BYPASS GRAFT  2002   x4 DR. VAN TRIGT    Family History  Problem Relation Age of Onset  . Hypertension Father   . Diabetes Mother        DIABETIC COMA    Social History:  reports that he has never smoked. He has never used smokeless tobacco. He reports that he does not drink alcohol or use drugs.   Review of Systems  Endocrine: Positive for erectile dysfunction.     Lipid history: On atorvastatin since diagnosis of CAD, labs as follows    Lab Results  Component Value Date   CHOL 92 (L) 09/04/2019   HDL 35 (L) 09/04/2019   LDLCALC 39 09/04/2019   LDLDIRECT 149.4 01/07/2011   TRIG 89 09/04/2019   CHOLHDL 2.6 09/04/2019           Hypertension: Has been followed by cardiologist for this  BP Readings from Last 3 Encounters:  09/21/19 134/70  09/04/19 (!) 158/80  01/24/19 (!) 150/92   RENAL dysfunction: His Lasix has been recently stopped Waiting for nephrology consultation ordered by cardiology NP  Lab Results  Component Value Date   CREATININE 2.62 (H) 09/04/2019   CREATININE 1.72 (H) 05/23/2019   CREATININE 1.85 (H) 03/10/2019    Most recent eye exam was in 2018  Most recent foot exam: 07/2018  Currently known complications of diabetes:none  LABS:  Office Visit on 09/21/2019  Component Date Value Ref Range Status  . POC Glucose 09/21/2019 147* 70 - 99 mg/dl Final    Physical Examination:  BP 134/70 (BP Location: Left Arm, Patient Position: Sitting, Cuff Size: Normal)   Pulse 81   Ht 5' 9.5"  (1.765 m)   Wt 166 lb 3.2 oz (75.4 kg)   SpO2 97%   BMI 24.19 kg/m          ASSESSMENT:  Diabetes type 2, mild  See history of present illness for detailed discussion of  current diabetes management, blood sugar patterns and problems identified  He has had some worsening of his diabetes control with A1c now 7.8 compared to 7.1 last year  This is despite improving his diet and losing weight He still not able to check his blood sugars at home Is unable to follow instructions for using his meter and needs repeat education  Since his fructosamine is also being high he likely has higher blood sugars at times, today blood sugar is 147  PLAN:    1. Glucose monitoring: He will need to review his monitoring technique with diabetes educator on his upcoming visit and reminded him to bring his meter for instructions . He will check check readings either fasting or 2 hours after meals at least every other day  2.    Medication recommendations He will start Januvia 25 mg daily  3.  He does need to establish a PCP for general care Currently waiting for hematology consultation for likely diagnosis of multiple myeloma  4.  Follow-up: 2 months    Patient Instructions  Check blood sugars on waking up 2-3 days a week  Also check blood sugars about 2 hours after meals and do this after different meals by rotation  Recommended blood sugar levels on waking up are 90-130 and about 2 hours after meal is 130-160  Please bring your blood sugar monitor to each visit, thank you        Elayne Snare 09/21/2019, 9:23 PM   Note: This office note was prepared with Dragon voice recognition system technology. Any transcriptional errors that result from this process are unintentional.   Elayne Snare

## 2019-09-21 NOTE — Patient Instructions (Signed)
Check blood sugars on waking up 2-3 days a week  Also check blood sugars about 2 hours after meals and do this after different meals by rotation  Recommended blood sugar levels on waking up are 90-130 and about 2 hours after meal is 130-160  Please bring your blood sugar monitor to each visit, thank you   

## 2019-09-26 ENCOUNTER — Telehealth: Payer: Self-pay | Admitting: Dietician

## 2019-09-26 ENCOUNTER — Other Ambulatory Visit: Payer: Self-pay

## 2019-09-26 ENCOUNTER — Encounter: Payer: PRIVATE HEALTH INSURANCE | Attending: Endocrinology | Admitting: Dietician

## 2019-09-26 ENCOUNTER — Encounter: Payer: Self-pay | Admitting: Dietician

## 2019-09-26 DIAGNOSIS — E1165 Type 2 diabetes mellitus with hyperglycemia: Secondary | ICD-10-CM | POA: Diagnosis not present

## 2019-09-26 DIAGNOSIS — N184 Chronic kidney disease, stage 4 (severe): Secondary | ICD-10-CM

## 2019-09-26 NOTE — Patient Instructions (Signed)
Please call me with any questions related to your diet or nutrition.  Choose breakfast, lunch, and dinner daily. Avoid skipping meals as you need the nutrition. Have a small snack if you are hungry. Continue to follow a low sodium diet. Avoid seasonings with Potassium Chloride in the ingredient list. Avoid foods with phos... in the ingredient list. Limit beverages with sugar as these can make your blood sugar hard to control.

## 2019-09-26 NOTE — Telephone Encounter (Signed)
Brief Nutrition Note: Called patient to remind him of his nutrition appointment today at 4:00 as well as location and asked that he bring his blood glucose meter.  Antonieta Iba, RD, LDN, CDE

## 2019-09-26 NOTE — Progress Notes (Signed)
Medical Nutrition Therapy:  Appt start time: 1400 end time:  1440.   Assessment:  Primary concerns today: Patient is here today with his wife.  This is important as patient did not remember things from last visit and his wife does the cooking.  Noted oncology concerns. He has been losing weight to 167 lbs today which is decreased from 176 lbs 08/17/2019.   His appetite has been poor. He did not start on Januvia as this was too expensive. Labs noted 09/04/19:  A1C 7.8%, eGFR 29, BUN 64, Creatinine 2.62, Potassium 4.1 He did bring his blood glucose meter.  He states that he could not get it to work.  He was not pushing the strip in far enough and may not have been putting this in correctly. He was able to properly use the meter during this visit with blood glucose of 108 today 2 1/2 hours after lunch.  History includes:  Type 2 Diabetes dx in 2018, CAD, CABG 2002, CHF, HTN, HLD, PAD, GFR 48 05/2019  Patien tlives with his wife and she does the shopping and cooking.  He retired from [employer] 08/17/19.    Preferred Learning Style:   No preference indicated   Learning Readiness:   Ready  Change in progress   MEDICATIONS: see list   DIETARY INTAKE:  24-hr recall:  B ( AM): skips " not a breakfast person" or egg, toast bacon or sandwich  Snk ( AM): none  L ( PM): chicken and rice casserole and cabbage or other leftovers OR [employer] Chicken nuggets and BBQ sauce Snk ( PM): occasional ice cream sandwich D ( PM): hamburger steak, gravy, mashed potatoes OR chili beans, corn bread, salad OR chicken fried steak OR chicken, yams, collard greens Snk ( PM): occasional ice cream sandwich or cookies or PB & jelly sandwich on [employer] bread Beverages: water (3-4 large glasses),  regular Hi-C, OJ, occasional G2 gatorade  Usual physical activity: walked a lot at work, changed since retirement and current illness.   Progress Towards Goal(s):  In progress.   Nutritional Diagnosis:  NB-1.1  Food and nutrition-related knowledge deficit As related to blood glucose monitoring, balance of nutrition.  As evidenced by diet hx, patient report.    Intervention:  Nutrition education regarding blood sugar monitoring.  Patient was able to demonstrate and wife observed.  Discussed frequency and times of monitoring.  Discussed that he should bring his meter to all of his appointments with Dr. [name].   Discussed importance of adequate nutrition and recommended avoidance of skipping meals.  Discussed basic meal components.  Suggested lower sodium and his wife states that she cooks without salt.  Discussed nutrition related to his current kidney function and no need to restrict potassium unless labs indicate a need to do so.  Discussed basic label reading and to avoid salt substitutes and other products made with potassium chloride and phos... as an added ingredient.    Please call me with any questions related to your diet or nutrition.  Choose breakfast, lunch, and dinner daily. Avoid skipping meals as you need the nutrition. Have a small snack if you are hungry. Continue to follow a low sodium diet. Avoid seasonings with Potassium Chloride in the ingredient list. Avoid foods with phos... in the ingredient list. Limit beverages with sugar as these can make your blood sugar hard to control.     Teaching Method Utilized:  Auditory Visual  Handouts given during visit include:  NKD- National Kidney Diet  for patients not on dialysisd How to Thrive:  A Guide for Your Journey with Diabetes by the ADA  Barriers to learning/adherence to lifestyle change: health  Demonstrated degree of understanding via:  Teach Back   Monitoring/Evaluation:  Dietary intake, exercise, and body weight in 2 month(s).

## 2019-09-27 ENCOUNTER — Telehealth: Payer: Self-pay

## 2019-09-27 ENCOUNTER — Telehealth: Payer: Self-pay | Admitting: Hematology

## 2019-09-27 ENCOUNTER — Inpatient Hospital Stay: Payer: No Typology Code available for payment source | Attending: Hematology | Admitting: Hematology

## 2019-09-27 ENCOUNTER — Inpatient Hospital Stay: Payer: No Typology Code available for payment source

## 2019-09-27 ENCOUNTER — Other Ambulatory Visit: Payer: Self-pay

## 2019-09-27 VITALS — BP 155/83 | HR 85 | Temp 98.0°F | Resp 18 | Ht 69.5 in | Wt 167.2 lb

## 2019-09-27 DIAGNOSIS — D892 Hypergammaglobulinemia, unspecified: Secondary | ICD-10-CM

## 2019-09-27 DIAGNOSIS — D472 Monoclonal gammopathy: Secondary | ICD-10-CM | POA: Insufficient documentation

## 2019-09-27 DIAGNOSIS — Z79899 Other long term (current) drug therapy: Secondary | ICD-10-CM | POA: Insufficient documentation

## 2019-09-27 DIAGNOSIS — I509 Heart failure, unspecified: Secondary | ICD-10-CM | POA: Diagnosis not present

## 2019-09-27 DIAGNOSIS — E119 Type 2 diabetes mellitus without complications: Secondary | ICD-10-CM | POA: Diagnosis not present

## 2019-09-27 DIAGNOSIS — D649 Anemia, unspecified: Secondary | ICD-10-CM | POA: Diagnosis not present

## 2019-09-27 DIAGNOSIS — Z7982 Long term (current) use of aspirin: Secondary | ICD-10-CM | POA: Diagnosis not present

## 2019-09-27 DIAGNOSIS — Z7984 Long term (current) use of oral hypoglycemic drugs: Secondary | ICD-10-CM | POA: Insufficient documentation

## 2019-09-27 DIAGNOSIS — N189 Chronic kidney disease, unspecified: Secondary | ICD-10-CM | POA: Insufficient documentation

## 2019-09-27 DIAGNOSIS — I110 Hypertensive heart disease with heart failure: Secondary | ICD-10-CM | POA: Diagnosis not present

## 2019-09-27 DIAGNOSIS — Z791 Long term (current) use of non-steroidal anti-inflammatories (NSAID): Secondary | ICD-10-CM | POA: Insufficient documentation

## 2019-09-27 DIAGNOSIS — E785 Hyperlipidemia, unspecified: Secondary | ICD-10-CM | POA: Insufficient documentation

## 2019-09-27 LAB — CMP (CANCER CENTER ONLY)
ALT: 15 U/L (ref 0–44)
AST: 15 U/L (ref 15–41)
Albumin: 3.2 g/dL — ABNORMAL LOW (ref 3.5–5.0)
Alkaline Phosphatase: 132 U/L — ABNORMAL HIGH (ref 38–126)
Anion gap: 6 (ref 5–15)
BUN: 21 mg/dL (ref 8–23)
CO2: 25 mmol/L (ref 22–32)
Calcium: 9 mg/dL (ref 8.9–10.3)
Chloride: 109 mmol/L (ref 98–111)
Creatinine: 1.67 mg/dL — ABNORMAL HIGH (ref 0.61–1.24)
GFR, Est AFR Am: 49 mL/min — ABNORMAL LOW (ref >60)
GFR, Estimated: 43 mL/min — ABNORMAL LOW (ref >60)
Glucose, Bld: 170 mg/dL — ABNORMAL HIGH (ref 70–99)
Potassium: 4.4 mmol/L (ref 3.5–5.1)
Sodium: 140 mmol/L (ref 135–145)
Total Bilirubin: 0.5 mg/dL (ref 0.3–1.2)
Total Protein: 10 g/dL — ABNORMAL HIGH (ref 6.5–8.1)

## 2019-09-27 LAB — CBC WITH DIFFERENTIAL/PLATELET
Abs Immature Granulocytes: 0.02 10*3/uL (ref 0.00–0.07)
Basophils Absolute: 0 10*3/uL (ref 0.0–0.1)
Basophils Relative: 0 %
Eosinophils Absolute: 0.2 10*3/uL (ref 0.0–0.5)
Eosinophils Relative: 3 %
HCT: 29.4 % — ABNORMAL LOW (ref 39.0–52.0)
Hemoglobin: 9.1 g/dL — ABNORMAL LOW (ref 13.0–17.0)
Immature Granulocytes: 0 %
Lymphocytes Relative: 21 %
Lymphs Abs: 1.5 10*3/uL (ref 0.7–4.0)
MCH: 26.9 pg (ref 26.0–34.0)
MCHC: 31 g/dL (ref 30.0–36.0)
MCV: 87 fL (ref 80.0–100.0)
Monocytes Absolute: 0.7 10*3/uL (ref 0.1–1.0)
Monocytes Relative: 10 %
Neutro Abs: 4.7 10*3/uL (ref 1.7–7.7)
Neutrophils Relative %: 66 %
Platelets: 306 10*3/uL (ref 150–400)
RBC: 3.38 MIL/uL — ABNORMAL LOW (ref 4.22–5.81)
RDW: 15.7 % — ABNORMAL HIGH (ref 11.5–15.5)
WBC: 7.2 10*3/uL (ref 4.0–10.5)
nRBC: 0 % (ref 0.0–0.2)

## 2019-09-27 LAB — IRON AND TIBC
Iron: 43 ug/dL (ref 42–163)
Saturation Ratios: 18 % — ABNORMAL LOW (ref 20–55)
TIBC: 233 ug/dL (ref 202–409)
UIBC: 190 ug/dL (ref 117–376)

## 2019-09-27 LAB — FERRITIN: Ferritin: 442 ng/mL — ABNORMAL HIGH (ref 24–336)

## 2019-09-27 LAB — LACTATE DEHYDROGENASE: LDH: 151 U/L (ref 98–192)

## 2019-09-27 LAB — SEDIMENTATION RATE: Sed Rate: 102 mm/hr — ABNORMAL HIGH (ref 0–16)

## 2019-09-27 NOTE — Telephone Encounter (Signed)
lLMTCB to schedule 8 week follow up with Dr [name].  Patient did not check out at his last visit.

## 2019-09-27 NOTE — Telephone Encounter (Signed)
Appointments made: lab 11/20/19 and Dr [name] 11/23/2019

## 2019-09-27 NOTE — Progress Notes (Signed)
HEMATOLOGY/ONCOLOGY CONSULTATION NOTE  Date of Service: 09/27/2019  Patient Care Team: Patient, No Pcp Per as PCP - General (General Practice) Welch, Joseph M, MD as PCP - Cardiology (Cardiology)  CHIEF COMPLAINTS/PURPOSE OF CONSULTATION:  Elevated protein  HISTORY OF PRESENTING ILLNESS:   [name] is a wonderful 64 y.o. male who has been referred to [place] by Dr [name] for evaluation and management of elevated protein. Pt is accompanied today by his wife, [name]. The pt reports that he is doing well overall.   The pt reports he was first told that he had Diabetes two years ago. He has not previously needed to be on medications for his diabetes but has recently been started on Januvia by Dr. Elayne Welch, his Endocrinologist. His blood glucose was 149 this morning. He sees [name] - NP and Dr. Martinique for Cardiology. Pt has CAD and had to have open heart surgery 20 years ago. He also has HTN that he feels has been stable. His Cardiology team is currently managing his heart medications and recently told pt to discontinue taking Furosemide due kidney function on last labs. Dr. Martinique and [name] have been essentially functioning as his primary care, as he does not have a PCP at this time. He has an upcoming appointment with [name] in the beginning of May and Dr. Dwyane Welch next Tuesday. Pt is currently using Ibuprofen a couple of times per week for his knee pain.  Pt has felt the same in the last 3-6 months and denies any new bone pain of any kinds. He is eating, functioning, and moving about as normal. He has lost about 13 lbs over the last few months. Pt does not smoke and does not drink much alcohol.   Most recent lab results (09/04/2019) of CBC, BMP and Hepatic function panel is as follows: all values are WNL except for RBC at 3.64, Hgb at 9.8, HCT at 29.8, Glucose at 129, BUN at 64, Creatinine at 2.62, GFR Est Af Am at 29, CO2 at 15, Total Protein at 10.0, Albumin  at 3.6, ALP at 163, ALT at 45.   On review of systems, pt reports unexpected weight loss, left knee pain and denies new back pain, new shoulder pain, new hip pain, chest pain, SOB, leg swelling, testicular pain/swelling and any other symptoms.   On PMHx the pt reports Left Knee Arthritis, CAD, CHF, Type II Diabetes, HTN, HLD, Cardiac Catheterization, Coronary Artery Bypass Graft x4 (2002). On Social Hx the pt reports that he is a non-smoker and does not drink much alcohol.   MEDICAL HISTORY:  Past Medical History:  Diagnosis Date  . Arthritis    "left knee" (11/24/2017)  . CAD (coronary artery disease)    CABG 2002  . CHF (congestive heart failure) ([place])   . Chronic renal insufficiency   . Diabetes mellitus without complication ([place])   . Dilated cardiomyopathy ([place])    echo in 2009 showed improvement with a normal EF  . HTN (hypertension)   . Hyperlipemia   . Noncompliance   . PAD (peripheral artery disease) ([place])     SURGICAL HISTORY: Past Surgical History:  Procedure Laterality Date  . CARDIAC CATHETERIZATION  2002, 2006  . CORONARY ARTERY BYPASS GRAFT  2002   x4 DR. VAN TRIGT    SOCIAL HISTORY: Social History   Socioeconomic History  . Marital status: Married    Spouse name: Not on file  . Number of children: 1  .  Years of education: Not on file  . Highest education level: Not on file  Occupational History  . Occupation: Furniture conservator/restorer  Tobacco Use  . Smoking status: Never Smoker  . Smokeless tobacco: Never Used  Substance and Sexual Activity  . Alcohol use: No  . Drug use: No  . Sexual activity: Yes  Other Topics Concern  . Not on file  Social History Narrative  . Not on file   Social Determinants of Health   Financial Resource Strain:   . Difficulty of Paying Living Expenses:   Food Insecurity:   . Worried About Charity fundraiser in the Last Year:   . Arboriculturist in the Last Year:   Transportation Needs:   . Film/video editor (Medical):   [name]  Lack of Transportation (Non-Medical):   Physical Activity:   . Days of Exercise per Week:   . Minutes of Exercise per Session:   Stress:   . Feeling of Stress :   Social Connections:   . Frequency of Communication with Friends and Family:   . Frequency of Social Gatherings with Friends and Family:   . Attends Religious Services:   . Active Member of Clubs or Organizations:   . Attends Archivist Meetings:   [name] Marital Status:   Intimate Partner Violence:   . Fear of Current or Ex-Partner:   . Emotionally Abused:   [name] Physically Abused:   . Sexually Abused:     FAMILY HISTORY: Family History  Problem Relation Age of Onset  . Hypertension Father   . Diabetes Mother        DIABETIC COMA    ALLERGIES:  has No Known Allergies.  MEDICATIONS:  Current Outpatient Medications  Medication Sig Dispense Refill  . Accu-Chek FastClix Lancets MISC Use Accu Chek fastclix lancets to check blood sugar fasting or 2 hours after a meal every other day. 100 each 2  . aspirin 325 MG EC tablet Take 325 mg by mouth daily.     [name] atorvastatin (LIPITOR) 80 MG tablet Take 1 tablet (80 mg total) by mouth daily. 90 tablet 3  . Blood Glucose Monitoring Suppl (ACCU-CHEK GUIDE ME) w/Device KIT 1 each by Does not apply route.    . carvedilol (COREG) 25 MG tablet Take 1 tablet (25 mg total) by mouth 2 (two) times daily with a meal. 180 tablet 3  . glucose blood (ACCU-CHEK GUIDE) test strip Use Accu Chek test strips as instructed to check blood sugar fasting or two hours after meals, every other day. 100 each 2  . hydrALAZINE (APRESOLINE) 50 MG tablet TAKE 1 TABLET BY MOUTH THREE TIMES DAILY 270 tablet 2  . ibuprofen (ADVIL,MOTRIN) 200 MG tablet Take 200 mg by mouth every 6 (six) hours as needed for moderate pain (for knee).    . isosorbide mononitrate (IMDUR) 30 MG 24 hr tablet Take 1 tablet (30 mg total) by mouth daily. 90 tablet 3  . losartan (COZAAR) 100 MG tablet Take 1 tablet by mouth once daily  90 tablet 1  . sitaGLIPtin (JANUVIA) 25 MG tablet Take 1 tablet (25 mg total) by mouth daily. 30 tablet 1  . furosemide (LASIX) 40 MG tablet Take 1 tablet (40 mg total) by mouth daily. (Patient not taking: Reported on 09/26/2019) 30 tablet 8   No current facility-administered medications for this visit.    REVIEW OF SYSTEMS:    10 Point review of Systems was done is negative except as noted above.  PHYSICAL EXAMINATION: ECOG PERFORMANCE STATUS: 0 - Asymptomatic  . Vitals:   09/27/19 1028  BP: (!) 155/83  Pulse: 85  Resp: 18  Temp: 98 F (36.7 C)  SpO2: 100%   Filed Weights   09/27/19 1028  Weight: 167 lb 3.2 oz (75.8 kg)   .Body mass index is 24.34 kg/m.  GENERAL:alert, in no acute distress and comfortable SKIN: no acute rashes, no significant lesions EYES: conjunctiva are pink and non-injected, sclera anicteric OROPHARYNX: MMM, no exudates, no oropharyngeal erythema or ulceration NECK: supple, no JVD LYMPH:  no palpable lymphadenopathy in the cervical, axillary or inguinal regions LUNGS: clear to auscultation b/l with normal respiratory effort HEART: regular rate & rhythm ABDOMEN:  normoactive bowel sounds , non tender, not distended. Extremity: no pedal edema PSYCH: alert & oriented x 3 with fluent speech NEURO: no focal motor/sensory deficits  LABORATORY DATA:  I have reviewed the data as listed  . CBC Latest Ref Rng & Units 09/27/2019 09/04/2019 01/24/2019  WBC 4.0 - 10.5 K/uL 7.2 7.0 7.0  Hemoglobin 13.0 - 17.0 g/dL 9.1(L) 9.8(L) 10.2(L)  Hematocrit 39.0 - 52.0 % 29.4(L) 29.8(L) 30.4(L)  Platelets 150 - 400 K/uL 306 309 208    . CMP Latest Ref Rng & Units 09/27/2019 09/04/2019 05/23/2019  Glucose 70 - 99 mg/dL 170(H) 129(H) 149(H)  BUN 8 - 23 mg/dL 21 64(H) 30(H)  Creatinine 0.61 - 1.24 mg/dL 1.67(H) 2.62(H) 1.72(H)  Sodium 135 - 145 mmol/L 140 137 138  Potassium 3.5 - 5.1 mmol/L 4.4 4.1 3.5  Chloride 98 - 111 mmol/L 109 106 107  CO2 22 - 32 mmol/L 25  15(L) 26  Calcium 8.9 - 10.3 mg/dL 9.0 9.3 9.4  Total Protein 6.5 - 8.1 g/dL 10.0(H) 10.0(H) 9.4(H)  Total Bilirubin 0.3 - 1.2 mg/dL 0.5 0.4 0.7  Alkaline Phos 38 - 126 U/L 132(H) 163(H) 147(H)  AST 15 - 41 U/L 15 30 17  ALT 0 - 44 U/L 15 45(H) 13     RADIOGRAPHIC STUDIES: I have personally reviewed the radiological images as listed and agreed with the findings in the report. No results found.  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 Related to CKD vs ? Plasma cell dyscrasia 3) CKD ?etiology - HTN ? Cardiac issues ? Monoclonal paraproteinemia related. PLAN: -Discussed patient's most recent labs from 09/04/2019, all values are WNL except for RBC at 3.64, Hgb at 9.8, HCT at 29.8, Glucose at 129, BUN at 64, Creatinine at 2.62, GFR Est Af Am at 29, CO2 at 15, Total Protein at 10.0, Albumin at 3.6, ALP at 163, ALT at 45, -Advised pt that increasing Total Protein, worsening anemia, and worsening kidney function is concerning for Multiple Myeloma. -Diabetes not likely cause for worsening kidney function given shorter duration of diagnosis and per patient mild presentation. HTN and CAD may be contributing.  -Pt denies any new bone pain but has experienced unexpected weight loss recently -Would recommend a full work up to evaluate the lab findings - pt okay with this  -Recommend pt begin following with a primary care physician to help coordinate care  -Recommend pt f/u wit Lori C Gerhardt - NP as scheduled -Recommend pt f/u with Dr. Kumar as scheduled -Will get labs today, including urine sample -Will get whole body skeletal survey in 3-5 days  -Will see back in 2 weeks  . Orders Placed This Encounter  Procedures  . DG Bone   Survey Met    Standing Status:   Future    Number of Occurrences:   1    Standing Expiration Date:   11/26/2020    Order Specific Question:   Reason for Exam (SYMPTOM  OR DIAGNOSIS REQUIRED)    Answer:    staging myeloma    Order Specific Question:   Preferred imaging location?    Answer:   [facility]  . CBC with Differential/Platelet    Standing Status:   Future    Number of Occurrences:   1    Standing Expiration Date:   10/31/2020  . CMP ([place] only)    Standing Status:   Future    Number of Occurrences:   1    Standing Expiration Date:   09/26/2020  . Multiple Myeloma Panel (SPEP&IFE w/QIG)    Standing Status:   Future    Number of Occurrences:   1    Standing Expiration Date:   09/26/2020  . Kappa/lambda light chains    Standing Status:   Future    Number of Occurrences:   1    Standing Expiration Date:   10/31/2020  . Beta 2 microglobulin    Standing Status:   Future    Number of Occurrences:   1    Standing Expiration Date:   09/26/2020  . Lactate dehydrogenase    Standing Status:   Future    Number of Occurrences:   1    Standing Expiration Date:   09/26/2020  . Sedimentation rate    Standing Status:   Future    Number of Occurrences:   1    Standing Expiration Date:   09/26/2020  . 24-Hr Ur UPEP/UIFE/Light Chains/TP    Standing Status:   Future    Number of Occurrences:   1    Standing Expiration Date:   09/26/2020  . Ferritin    Standing Status:   Future    Number of Occurrences:   1    Standing Expiration Date:   09/26/2020  . Iron and TIBC    Standing Status:   Future    Number of Occurrences:   1    Standing Expiration Date:   09/26/2020  . Erythropoietin    Standing Status:   Future    Number of Occurrences:   1    Standing Expiration Date:   09/26/2020     FOLLOW UP: Labs today Whole body skeletal survey in 3-5 days RTC with Dr Irene Limbo in 2 weeks   All of the patients questions were answered with apparent satisfaction. The patient knows to call the clinic with any problems, questions or concerns.  I spent 40 mins counseling the patient face to face. The total time spent in the appointment was 60 mins and more than 50% was on counseling and  direct patient cares.    Sullivan Lone MD [place] AAHIVMS [facility] Hematology/Oncology Physician [facility]  (Office):       [phone] (Work cell):  [phone] (Fax):           [phone]  09/27/2019 2:23 PM  I, Yevette Edwards, am acting as a scribe for Dr. Sullivan Lone.   .I have reviewed the above documentation for accuracy and completeness, and I agree with the above. Brunetta Genera MD

## 2019-09-27 NOTE — Patient Instructions (Signed)
Thank you for choosing [facility] to provide your oncology and hematology care.   Should you have questions after your visit to the [facility] ([facility]), please contact this office at [phone] between 8:30 AM and 4:30 PM.  Voice mails left after 4:00 PM may not be returned until the following business day.  Calls received after 4:30 PM will be answered by an off-site Nurse Triage Line.    Prescription Refills:  Please have your pharmacy contact us directly for most prescription requests.  Contact the office directly for refills of narcotics (pain medications). Allow 48-72 hours for refills.  Appointments: Please contact the [facility] scheduling department [phone] for questions regarding [facility] appointment scheduling.  Contact the schedulers with any scheduling changes so that your appointment can be rescheduled in a timely manner.   Central Scheduling for [place] [phone] - Call to schedule procedures such as PET scans, CT scans, MRI, Ultrasound, etc.  To afford each patient quality time with our providers, please arrive 30 minutes before your scheduled appointment time.  If you arrive late for your appointment, you may be asked to reschedule.  We strive to give you quality time with our providers, and arriving late affects you and other patients whose appointments are after yours. If you are a no show for multiple scheduled visits, you may be dismissed from the clinic at the providers discretion.     Resources: [facility] Social Workers [phone] for additional information on assistance programs or assistance connecting with community support programs   [place] DSS  336-641-3447: Information regarding food stamps, Medicaid, and utility assistance SCAT 336-333-6589   [place] [employer]'s shared-ride transportation service for eligible riders who have a disability that prevents them from riding the fixed route bus.   Medicare Rights Center  800-333-4114 Helps people with Medicare understand their rights and benefits, navigate the Medicare system, and secure the quality healthcare they deserve [employer] 800-227-2345 Assists patients locate various types of support and financial assistance Cancer Care: 1-800-813-HOPE (4673) Provides financial assistance, online support groups, medication/co-pay assistance.   Transportation Assistance for appointments at [facility]: Transportation Coordinator [phone]  Again, thank you for choosing [facility] for your care.       

## 2019-09-27 NOTE — Telephone Encounter (Signed)
Scheduled per los. Gave avs and calendar  

## 2019-09-28 LAB — BETA 2 MICROGLOBULIN, SERUM: Beta-2 Microglobulin: 3.4 mg/L — ABNORMAL HIGH (ref 0.6–2.4)

## 2019-09-28 LAB — KAPPA/LAMBDA LIGHT CHAINS
Kappa free light chain: 24.6 mg/L — ABNORMAL HIGH (ref 3.3–19.4)
Kappa, lambda light chain ratio: 1.72 — ABNORMAL HIGH (ref 0.26–1.65)
Lambda free light chains: 14.3 mg/L (ref 5.7–26.3)

## 2019-09-28 LAB — ERYTHROPOIETIN: Erythropoietin: 9 m[IU]/mL (ref 2.6–18.5)

## 2019-09-29 ENCOUNTER — Ambulatory Visit (HOSPITAL_COMMUNITY)
Admission: RE | Admit: 2019-09-29 | Discharge: 2019-09-29 | Disposition: A | Discharge status: Home / Self Care | Payer: No Typology Code available for payment source | Source: Ambulatory Visit | Attending: Hematology | Admitting: Hematology

## 2019-09-29 ENCOUNTER — Other Ambulatory Visit: Payer: Self-pay

## 2019-09-29 DIAGNOSIS — D892 Hypergammaglobulinemia, unspecified: Secondary | ICD-10-CM | POA: Diagnosis present

## 2019-09-29 DIAGNOSIS — D472 Monoclonal gammopathy: Secondary | ICD-10-CM | POA: Diagnosis not present

## 2019-09-29 DIAGNOSIS — D649 Anemia, unspecified: Secondary | ICD-10-CM | POA: Diagnosis present

## 2019-10-02 LAB — MULTIPLE MYELOMA PANEL, SERUM
Albumin SerPl Elph-Mcnc: 3.5 g/dL (ref 2.9–4.4)
Albumin/Glob SerPl: 0.7 (ref 0.7–1.7)
Alpha 1: 0.2 g/dL (ref 0.0–0.4)
Alpha2 Glob SerPl Elph-Mcnc: 0.9 g/dL (ref 0.4–1.0)
B-Globulin SerPl Elph-Mcnc: 1 g/dL (ref 0.7–1.3)
Gamma Glob SerPl Elph-Mcnc: 3.7 g/dL — ABNORMAL HIGH (ref 0.4–1.8)
Globulin, Total: 5.8 g/dL — ABNORMAL HIGH (ref 2.2–3.9)
IgA: 61 mg/dL (ref 61–437)
IgG (Immunoglobin G), Serum: 4565 mg/dL — ABNORMAL HIGH (ref 603–1613)
IgM (Immunoglobulin M), Srm: 17 mg/dL — ABNORMAL LOW (ref 20–172)
M Protein SerPl Elph-Mcnc: 3.4 g/dL — ABNORMAL HIGH
Total Protein ELP: 9.3 g/dL — ABNORMAL HIGH (ref 6.0–8.5)

## 2019-10-03 LAB — UPEP/UIFE/LIGHT CHAINS/TP, 24-HR UR
% BETA, Urine: 21.7 %
ALPHA 1 URINE: 1.1 %
Albumin, U: 31.1 %
Alpha 2, Urine: 14 %
Free Kappa Lt Chains,Ur: 64.88 mg/L (ref 0.63–113.79)
Free Kappa/Lambda Ratio: 4.68 (ref 1.03–31.76)
Free Lambda Lt Chains,Ur: 13.85 mg/L — ABNORMAL HIGH (ref 0.47–11.77)
GAMMA GLOBULIN URINE: 32.1 %
M-SPIKE %, Urine: 17.1 % — ABNORMAL HIGH
M-Spike, Mg/24 Hr: 103 mg/24 hr — ABNORMAL HIGH
Total Protein, Urine-Ur/day: 602 mg/24 hr — ABNORMAL HIGH (ref 30–150)
Total Protein, Urine: 80.3 mg/dL
Total Volume: 750

## 2019-10-04 ENCOUNTER — Other Ambulatory Visit: Payer: Self-pay

## 2019-10-04 ENCOUNTER — Ambulatory Visit (INDEPENDENT_AMBULATORY_CARE_PROVIDER_SITE_OTHER): Payer: PRIVATE HEALTH INSURANCE | Admitting: Internal Medicine

## 2019-10-04 ENCOUNTER — Encounter: Payer: Self-pay | Admitting: Internal Medicine

## 2019-10-04 VITALS — BP 140/80 | HR 72 | Temp 97.7°F | Ht 69.5 in | Wt 171.3 lb

## 2019-10-04 DIAGNOSIS — I5032 Chronic diastolic (congestive) heart failure: Secondary | ICD-10-CM

## 2019-10-04 DIAGNOSIS — N1831 Chronic kidney disease, stage 3a: Secondary | ICD-10-CM | POA: Diagnosis not present

## 2019-10-04 DIAGNOSIS — Z23 Encounter for immunization: Secondary | ICD-10-CM | POA: Diagnosis not present

## 2019-10-04 DIAGNOSIS — E7800 Pure hypercholesterolemia, unspecified: Secondary | ICD-10-CM | POA: Diagnosis not present

## 2019-10-04 DIAGNOSIS — I10 Essential (primary) hypertension: Secondary | ICD-10-CM | POA: Diagnosis not present

## 2019-10-04 DIAGNOSIS — E1165 Type 2 diabetes mellitus with hyperglycemia: Secondary | ICD-10-CM

## 2019-10-04 DIAGNOSIS — I2510 Atherosclerotic heart disease of native coronary artery without angina pectoris: Secondary | ICD-10-CM

## 2019-10-04 DIAGNOSIS — Z1211 Encounter for screening for malignant neoplasm of colon: Secondary | ICD-10-CM

## 2019-10-04 NOTE — Patient Instructions (Signed)
-  Nice seeing you today!!  -Tetanus vaccine today.  -Referrals to kidney and eye doctor today.  -Schedule follow up in 3 months for your physical. Please come in fasting that day.

## 2019-10-04 NOTE — Progress Notes (Signed)
New Patient Office Visit     This visit occurred during the SARS-CoV-2 public health emergency.  Safety protocols were in place, including screening questions prior to the visit, additional usage of staff PPE, and extensive cleaning of exam room while observing appropriate contact time as indicated for disinfecting solutions.    CC/Reason for Visit: Establish care Previous PCP: None Last Visit: Unknown  HPI: [name] is a 64 y.o. male who is coming in today for the above mentioned reasons.  He has complex past medical history.  He has been mainly followed by his cardiologist, Dr. Carole Civil.  He has a history of coronary artery disease status post CABG in 2002 with dilated cardiomyopathy and longstanding noncompliance with follow-ups and medications.  Echocardiogram in 2009 showed improvement in his LV function.  Cardiac catheterization in 2006 showed disease but he was not a candidate for further revascularization.  He also has a history of hypertension, hyperlipidemia and diabetes.  There is a well-documented history of noncompliance.  He was recently diagnosed with diabetes and referred to endocrine, Dr. [name].  Earlier this month his A1c was 7.8.  He was started on Januvia 25 mg daily which he has not been taking due to cost issues.  He has not discussed this with Dr. [name] yet.  There is also concern that he might have multiple myeloma and is currently undergoing work-up with oncology, Dr. Irene Limbo.  He has stage III-IV chronic kidney disease.  Has never seen nephrology.  He has never had a screening colonoscopy, he has never been screened for prostate cancer.  He is due for Tdap which he agrees to receive today.  He himself has no complaints at today's visit.   Past Medical/Surgical History: Past Medical History:  Diagnosis Date  . Arthritis    "left knee" (11/24/2017)  . CAD (coronary artery disease)    CABG 2002  . CHF (congestive heart failure) ([place])   . Chronic  renal insufficiency   . Diabetes mellitus without complication ([place])   . Dilated cardiomyopathy ([place])    echo in 2009 showed improvement with a normal EF  . HTN (hypertension)   . Hyperlipemia   . Noncompliance   . PAD (peripheral artery disease) ([place])     Past Surgical History:  Procedure Laterality Date  . CARDIAC CATHETERIZATION  2002, 2006  . CORONARY ARTERY BYPASS GRAFT  2002   x4 DR. VAN TRIGT    Social History:  reports that he has never smoked. He has never used smokeless tobacco. He reports that he does not drink alcohol or use drugs.  Allergies: No Known Allergies  Family History:  Family History  Problem Relation Age of Onset  . Hypertension Father   . Diabetes Mother        DIABETIC COMA     Current Outpatient Medications:  .  Accu-Chek FastClix Lancets MISC, Use Accu Chek fastclix lancets to check blood sugar fasting or 2 hours after a meal every other day., Disp: 100 each, Rfl: 2 .  aspirin 325 MG EC tablet, Take 325 mg by mouth daily. , Disp: , Rfl:  .  atorvastatin (LIPITOR) 80 MG tablet, Take 1 tablet (80 mg total) by mouth daily., Disp: 90 tablet, Rfl: 3 .  Blood Glucose Monitoring Suppl (ACCU-CHEK GUIDE ME) w/Device KIT, 1 each by Does not apply route., Disp: , Rfl:  .  carvedilol (COREG) 25 MG tablet, Take 1 tablet (25 mg total) by mouth 2 (two) times daily with  a meal., Disp: 180 tablet, Rfl: 3 .  furosemide (LASIX) 40 MG tablet, Take 1 tablet (40 mg total) by mouth daily., Disp: 30 tablet, Rfl: 8 .  glucose blood (ACCU-CHEK GUIDE) test strip, Use Accu Chek test strips as instructed to check blood sugar fasting or two hours after meals, every other day., Disp: 100 each, Rfl: 2 .  hydrALAZINE (APRESOLINE) 50 MG tablet, TAKE 1 TABLET BY MOUTH THREE TIMES DAILY, Disp: 270 tablet, Rfl: 2 .  ibuprofen (ADVIL,MOTRIN) 200 MG tablet, Take 200 mg by mouth every 6 (six) hours as needed for moderate pain (for knee)., Disp: , Rfl:  .  isosorbide mononitrate (IMDUR)  30 MG 24 hr tablet, Take 1 tablet (30 mg total) by mouth daily., Disp: 90 tablet, Rfl: 3 .  losartan (COZAAR) 100 MG tablet, Take 1 tablet by mouth once daily, Disp: 90 tablet, Rfl: 1 .  sitaGLIPtin (JANUVIA) 25 MG tablet, Take 1 tablet (25 mg total) by mouth daily. (Patient not taking: Reported on 10/04/2019), Disp: 30 tablet, Rfl: 1  Review of Systems:  Constitutional: Denies fever, chills, diaphoresis, appetite change and fatigue.  HEENT: Denies photophobia, eye pain, redness, hearing loss, ear pain, congestion, sore throat, rhinorrhea, sneezing, mouth sores, trouble swallowing, neck pain, neck stiffness and tinnitus.   Respiratory: Denies SOB, DOE, cough, chest tightness,  and wheezing.   Cardiovascular: Denies chest pain, palpitations and leg swelling.  Gastrointestinal: Denies nausea, vomiting, abdominal pain, diarrhea, constipation, blood in stool and abdominal distention.  Genitourinary: Denies dysuria, urgency, frequency, hematuria, flank pain and difficulty urinating.  Endocrine: Denies: hot or cold intolerance, sweats, changes in hair or nails, polyuria, polydipsia. Musculoskeletal: Denies myalgias, back pain, joint swelling, arthralgias and gait problem.  Skin: Denies pallor, rash and wound.  Neurological: Denies dizziness, seizures, syncope, weakness, light-headedness, numbness and headaches.  Hematological: Denies adenopathy. Easy bruising, personal or family bleeding history  Psychiatric/Behavioral: Denies suicidal ideation, mood changes, confusion, nervousness, sleep disturbance and agitation    Physical Exam: Vitals:   10/04/19 1116  BP: 140/80  Pulse: 72  Temp: 97.7 F (36.5 C)  TempSrc: Temporal  SpO2: 98%  Weight: 171 lb 4.8 oz (77.7 kg)  Height: 5' 9.5" (1.765 m)   Body mass index is 24.93 kg/m.  Constitutional: NAD, calm, comfortable Eyes: PERRL, lids and conjunctivae normal ENMT: Mucous membranes are moist.  Respiratory: clear to auscultation bilaterally,  no wheezing, no crackles. Normal respiratory effort. No accessory muscle use.  Cardiovascular: Regular rate and rhythm, no murmurs / rubs / gallops. No extremity edema.  Neurologic: Nonfocal Psychiatric: t. Alert and oriented x 3. Normal mood.    Impression and Plan:  Uncontrolled type 2 diabetes mellitus with hyperglycemia ([place])   -He is already under the care of endocrinology.  He has not been taking any medications as Januvia was cost prohibitive for him, he states this was going to be upwards of $500 a month. -I have urged him to reach out to his endocrinologist to see what other options are available for him. -I do not believe he would be a candidate for Metformin given his advanced kidney disease. -His most recent A1c was 7.8 in February 2021. -Ophthalmology referral today for diabetic eye exam.  Essential hypertension -Blood pressure today is 140/80 which is improved from his last visit with cardiology. -Continue to monitor.  Pure hypercholesterolemia -Last LDL was 39 in July 2020, continue atorvastatin 80 mg.  Chronic renal impairment, stage 3a -His baseline creatinine appears to be around 1.6-2.3. -He has  not yet seen nephrology, believe it is time to initiate this referral.  Coronary artery disease involving native coronary artery of native heart without angina pectoris Chronic diastolic CHF (congestive heart failure) (HCC) -Stable, compensated. -On aspirin, statin, carvedilol, losartan, isosorbide mononitrate. -Follows with cardiology.  Possible multiple myeloma -Currently undergoing work-up by oncology.    Patient Instructions  -Nice seeing you today!!  -Tetanus vaccine today.  -Referrals to kidney and eye doctor today.  -Schedule follow up in 3 months for your physical. Please come in fasting that day.     Lelon Frohlich, MD [facility]

## 2019-10-10 NOTE — Progress Notes (Signed)
HEMATOLOGY/ONCOLOGY CONSULTATION NOTE  Date of Service: 10/10/2019  Patient Care Team: Isaac Bliss, Rayford Halsted, MD as PCP - General (Internal Medicine) Martinique, Peter M, MD as PCP - Cardiology (Cardiology)  CHIEF COMPLAINTS/PURPOSE OF CONSULTATION:  Elevated protein  HISTORY OF PRESENTING ILLNESS:   [name] is a wonderful 64 y.o. male who has been referred to [place] by Dr Servando Snare for evaluation and management of elevated protein. Pt is accompanied today by his wife, Mrs. Kellis.The patient's last visit with [place] was on 09/27/19. The pt reports that he is doing well overall.  The pt reports he is good. Pt has not gotten the COVID19 vaccine.   Of note since the patient's last visit, pt has had Bone Survey Met ([id number]) completed on 09/29/19 with results revealing No radiographic evidence of focal lesion in the axial or appendicular skeleton.  Lab results today (09/27/19) of CBC w/diff and CMP is as follows: all values are WNL except for RBC at 3.38, Hemoglobin at 9.1, HCT at 29.4, RDW at 15.7, Glucose at 170, Creatinine at 1.67, Total Protein at 10.0, Albumin at 3.2, Alkaline Phosphatase at 132, GFR, Est Non Af Am at 43, GFR, Est AFR Am at 49 09/27/19 of Erythropoietin at 9.0 09/29/19 of 24-Hr Ur UPEP/UIFE/Light Chains/TP: all values are WNL except for: Total Protein, Urine-UR/day at 602, Free Lambda Lt Chains, Ur at 13.85, M-Spike %, Urine at 17.1, M-Spike, Mg/24 Hr at 103 09/27/19 of Iron and TIBC: all values are WNL except for: Saturation Ratios at 18 09/27/19 of Ferritin at 442 09/27/19 of Sedimentation Rate at 102 09/27/19 of LDH at 151 09/27/19 Beta 2 Microglobulin at 3.4 09/27/19 of Kappa/Lambda light chains: all values are WNL except for: Kappa free light chain at 24.6, Kappa, Lamda Light Chain Ratio at 1.72 09/27/19 of MMP: all values are WNL except for: IgG at 4565, IgM at 17, Total Protein ELP at 9.3, Gamma Glob SerPl Elph-Mcnc at 3.7, M Protein SerPl Elph-Mcnc  at 3.4, Globulin,Total at 5.8  On review of systems, pt reports healthy appetite, and denies fatigue, neuropathy, blood clots, Myocardial infarction, abdominal pain, new bone pain, strokes and any other symptoms.     MEDICAL HISTORY:  Past Medical History:  Diagnosis Date  . Arthritis    "left knee" (11/24/2017)  . CAD (coronary artery disease)    CABG 2002  . CHF (congestive heart failure) ([place])   . Chronic renal insufficiency   . Diabetes mellitus without complication ([place])   . Dilated cardiomyopathy ([place])    echo in 2009 showed improvement with a normal EF  . HTN (hypertension)   . Hyperlipemia   . Noncompliance   . PAD (peripheral artery disease) ([place])     SURGICAL HISTORY: Past Surgical History:  Procedure Laterality Date  . CARDIAC CATHETERIZATION  2002, 2006  . CORONARY ARTERY BYPASS GRAFT  2002   x4 DR. VAN TRIGT    SOCIAL HISTORY: Social History   Socioeconomic History  . Marital status: Married    Spouse name: Not on file  . Number of children: 1  . Years of education: Not on file  . Highest education level: Not on file  Occupational History  . Occupation: Furniture conservator/restorer  Tobacco Use  . Smoking status: Never Smoker  . Smokeless tobacco: Never Used  Substance and Sexual Activity  . Alcohol use: No  . Drug use: No  . Sexual activity: Yes  Other Topics Concern  . Not on file  Social History Narrative  .  Not on file   Social Determinants of Health   Financial Resource Strain:   . Difficulty of Paying Living Expenses:   Food Insecurity:   . Worried About Charity fundraiser in the Last Year:   . Arboriculturist in the Last Year:   Transportation Needs:   . Film/video editor (Medical):   [name] Lack of Transportation (Non-Medical):   Physical Activity:   . Days of Exercise per Week:   . Minutes of Exercise per Session:   Stress:   . Feeling of Stress :   Social Connections:   . Frequency of Communication with Friends and Family:   . Frequency of  Social Gatherings with Friends and Family:   . Attends Religious Services:   . Active Member of Clubs or Organizations:   . Attends Archivist Meetings:   [name] Marital Status:   Intimate Partner Violence:   . Fear of Current or Ex-Partner:   . Emotionally Abused:   [name] Physically Abused:   . Sexually Abused:     FAMILY HISTORY: Family History  Problem Relation Age of Onset  . Hypertension Father   . Diabetes Mother        DIABETIC COMA    ALLERGIES:  has No Known Allergies.  MEDICATIONS:  Current Outpatient Medications  Medication Sig Dispense Refill  . Accu-Chek FastClix Lancets MISC Use Accu Chek fastclix lancets to check blood sugar fasting or 2 hours after a meal every other day. 100 each 2  . aspirin 325 MG EC tablet Take 325 mg by mouth daily.     [name] atorvastatin (LIPITOR) 80 MG tablet Take 1 tablet (80 mg total) by mouth daily. 90 tablet 3  . Blood Glucose Monitoring Suppl (ACCU-CHEK GUIDE ME) w/Device KIT 1 each by Does not apply route.    . carvedilol (COREG) 25 MG tablet Take 1 tablet (25 mg total) by mouth 2 (two) times daily with a meal. 180 tablet 3  . furosemide (LASIX) 40 MG tablet Take 1 tablet (40 mg total) by mouth daily. 30 tablet 8  . glucose blood (ACCU-CHEK GUIDE) test strip Use Accu Chek test strips as instructed to check blood sugar fasting or two hours after meals, every other day. 100 each 2  . hydrALAZINE (APRESOLINE) 50 MG tablet TAKE 1 TABLET BY MOUTH THREE TIMES DAILY 270 tablet 2  . ibuprofen (ADVIL,MOTRIN) 200 MG tablet Take 200 mg by mouth every 6 (six) hours as needed for moderate pain (for knee).    . isosorbide mononitrate (IMDUR) 30 MG 24 hr tablet Take 1 tablet (30 mg total) by mouth daily. 90 tablet 3  . losartan (COZAAR) 100 MG tablet Take 1 tablet by mouth once daily 90 tablet 1  . sitaGLIPtin (JANUVIA) 25 MG tablet Take 1 tablet (25 mg total) by mouth daily. (Patient not taking: Reported on 10/04/2019) 30 tablet 1   No current  facility-administered medications for this visit.    REVIEW OF SYSTEMS:    A 10+ POINT REVIEW OF SYSTEMS WAS OBTAINED including neurology, dermatology, psychiatry, cardiac, respiratory, lymph, extremities, GI, GU, Musculoskeletal, constitutional, breasts, reproductive, HEENT.  All pertinent positives are noted in the HPI.  All others are negative.   PHYSICAL EXAMINATION: ECOG PERFORMANCE STATUS: 0 - Asymptomatic  . Vitals:   10/11/19 0910  BP: (!) 150/74  Pulse: 80  Resp: 17  Temp: 98.3 F (36.8 C)  SpO2: 100%   Filed Weights   10/11/19 0910  Weight: 171  lb 8 oz (77.8 kg)   .Body mass index is 24.96 kg/m.  Exam given in chair   GENERAL:alert, in no acute distress and comfortable SKIN: no acute rashes, no significant lesions EYES: conjunctiva are pink and non-injected, sclera anicteric OROPHARYNX: MMM, no exudates, no oropharyngeal erythema or ulceration NECK: supple, no JVD LYMPH:  no palpable lymphadenopathy in the cervical, axillary or inguinal regions LUNGS: clear to auscultation b/l with normal respiratory effort HEART: regular rate & rhythm ABDOMEN:  normoactive bowel sounds , non tender, not distended. Extremity: no pedal edema PSYCH: alert & oriented x 3 with fluent speech NEURO: no focal motor/sensory deficits  LABORATORY DATA:  I have reviewed the data as listed  . CBC Latest Ref Rng & Units 09/27/2019 09/04/2019 01/24/2019  WBC 4.0 - 10.5 K/uL 7.2 7.0 7.0  Hemoglobin 13.0 - 17.0 g/dL 9.1(L) 9.8(L) 10.2(L)  Hematocrit 39.0 - 52.0 % 29.4(L) 29.8(L) 30.4(L)  Platelets 150 - 400 K/uL 306 309 208    . CMP Latest Ref Rng & Units 09/27/2019 09/04/2019 05/23/2019  Glucose 70 - 99 mg/dL 170(H) 129(H) 149(H)  BUN 8 - 23 mg/dL 21 64(H) 30(H)  Creatinine 0.61 - 1.24 mg/dL 1.67(H) 2.62(H) 1.72(H)  Sodium 135 - 145 mmol/L 140 137 138  Potassium 3.5 - 5.1 mmol/L 4.4 4.1 3.5  Chloride 98 - 111 mmol/L 109 106 107  CO2 22 - 32 mmol/L 25 15(L) 26  Calcium 8.9 - 10.3  mg/dL 9.0 9.3 9.4  Total Protein 6.5 - 8.1 g/dL 10.0(H) 10.0(H) 9.4(H)  Total Bilirubin 0.3 - 1.2 mg/dL 0.5 0.4 0.7  Alkaline Phos 38 - 126 U/L 132(H) 163(H) 147(H)  AST 15 - 41 U/L _0 ALT 0 - 44 U/L 15 45(H) 13     RADIOGRAPHIC STUDIES: I have personally reviewed the radiological images as listed and agreed with the findings in the report. DG Bone Survey Met  Result Date: 09/29/2019 CLINICAL DATA:  Myeloma staging. Anemia. EXAM: METASTATIC BONE SURVEY COMPARISON:  Chest radiograph 11/25/2017. FINDINGS: No discrete destructive lytic or focal blastic lesions in the axial or appendicular skeleton. Large flowing anterior osteophytes in the cervical and thoracic spine consistent with diffuse idiopathic skeletal hyperostosis. Large anterior spurs in the lumbar spine. Peripheral vascular calcifications with surgical clips in the right lower extremity and right forearm soft tissues. Multifocal osteoarthritis involving the joints of the upper and lower extremities. Patient is post median sternotomy and CABG. The heart is normal in size. 6 mm nodule in the left mid lung may represent calcified granuloma, and is unchanged from 2019 radiograph. IMPRESSION: No radiographic evidence of focal lesion in the axial or appendicular skeleton. Electronically Signed   By: Keith Rake M.D.   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 Related to CKD vs ? Plasma cell dyscrasia 3) CKD ?etiology - HTN ? Cardiac issues ? Monoclonal paraproteinemia related.  PLAN: -Discussed pt labwork today, 09/27/19; of CBC w/diff and CMP is as follows: all values are WNL except for RBC at 3.38, Hemoglobin at 9.1, HCT at 29.4, RDW at 15.7, Glucose at 170, Creatinine at 1.67, Total Protein at 10.0, Albumin at 3.2, Alkaline Phosphatase at 132, GFR, Est Non Af Am at 43, GFR, Est AFR Am at 49 -Discussed 09/27/19 of Erythropoietin at  9.0 -Discussed 09/29/19 of 24-Hr Ur UPEP/UIFE/Light Chains/TP: all values are WNL except for: Total Protein, Urine-UR/day  at 602, Free Lambda Lt Chains, Ur at 13.85, M-Spike %, Urine at 17.1, M-Spike, Mg/24 Hr at 103 -Discussed 09/27/19 of Iron and TIBC: all values are WNL except for: Saturation Ratios at 18 -Discussed 09/27/19 of Ferritin at 442 -Discussed 09/27/19 of Sedimentation Rate at 102 -Discussed 09/27/19 of LDH at 151 -Discussed 09/27/19 Beta 2 Microglobulin at 3.4 -Discussed 09/27/19 of Kappa/Lambda light chains: all values are WNL except for: Kappa free light chain at 24.6, Kappa, Lamda Light Chain Ratio at 1.72 -Discussed 09/27/19 of MMP: all values are WNL except for: IgG at 4565, IgM at 17, Total Protein ELP at 9.3, Gamma Glob SerPl Elph-Mcnc at 3.7, M Protein SerPl Elph-Mcnc at 3.4, Globulin,Total at 5.8 -Diabetes not likely cause for worsening kidney function given shorter duration of diagnosis and per patient mild presentation. HTN and CAD may be contributing -Advised on CRAB Criteria- how the patient fits into the criteria  -Educated on multiple myeloma - Educated myeloma is reated to control, treatment options  -Educated on Bone Marrow Biopsy process  -Advised pt that increasing M Protein, worsening anemia, and worsening kidney function is concerning for Multiple Myeloma. -Recommends staying hydrated  -Recommends getting COVID19 vaccine  -Will get PET CT Scan  -Will get Bone Marrow Biopsy   No orders of the defined types were placed in this encounter.    FOLLOW UP: CT bone marrow biopsy ASAP in 2-3 days PET/CT in 5 days RTC with Dr Irene Limbo in 10 days    The total time spent in the appt was 30 minutes and more than 50% was on counseling and direct patient cares.  All of the patient's questions were answered with apparent satisfaction. The patient knows to call the clinic with any problems, questions or concerns.     Sullivan Lone MD [place] AAHIVMS [facility]  [facility] Hematology/Oncology Physician [facility]  (Office):       [phone] (Work cell):  [phone] (Fax):           [phone]  10/10/2019 3:25 PM  I, Dawayne Cirri am acting as a Education administrator for Dr. Sullivan Lone.   .I have reviewed the above documentation for accuracy and completeness, and I agree with the above. Brunetta Genera MD

## 2019-10-11 ENCOUNTER — Inpatient Hospital Stay (HOSPITAL_BASED_OUTPATIENT_CLINIC_OR_DEPARTMENT_OTHER): Payer: No Typology Code available for payment source | Admitting: Hematology

## 2019-10-11 ENCOUNTER — Other Ambulatory Visit: Payer: Self-pay

## 2019-10-11 ENCOUNTER — Telehealth: Payer: Self-pay | Admitting: Hematology

## 2019-10-11 VITALS — BP 150/74 | HR 80 | Temp 98.3°F | Resp 17 | Ht 69.5 in | Wt 171.5 lb

## 2019-10-11 DIAGNOSIS — C9000 Multiple myeloma not having achieved remission: Secondary | ICD-10-CM | POA: Diagnosis not present

## 2019-10-11 DIAGNOSIS — D472 Monoclonal gammopathy: Secondary | ICD-10-CM | POA: Diagnosis not present

## 2019-10-11 DIAGNOSIS — D649 Anemia, unspecified: Secondary | ICD-10-CM | POA: Diagnosis not present

## 2019-10-11 NOTE — Progress Notes (Addendum)
CARDIOLOGY OFFICE NOTE  Date:  10/16/2019    [name] Date of Birth: [birth year] Medical Record #[id number]  PCP:  Isaac Bliss, Rayford Halsted, MD  Cardiologist:  Anthonia Monger & Martinique   Chief Complaint  Patient presents with  . Follow-up    Seen for Dr. Martinique    History of Present Illness: [name] is a 64 y.o. male who presents today for a follow up visit.  Seen for Dr. Martinique.He typically follows with me.   He has a history ofCAD s/p CABG 2002, HTN,dilated cardiomyopathy, hyperlipidemia and long standing noncompliance with follow-up/meds. Echocardiogram in 2009 showed significant improvement in his LV function and EF was normal. Cardiac catheterization in 2006 demonstrated diseasebut he wasnot a candidate for further revascularization. Remote CTA abdomen showed 50% left renal artery stenosis in 2006 as well. Follow up renal duplex was ok.   I have followed him for many years - he tends to go long periods between visits despite our recommendations. He ended up being admitted in May of 2019 with marked HTN and CHF requiring aggressive diuresis. Echo showed EF of 50 to 55% with DD. He has been found to have diabetes - took several visits to convince him to see Endocrine. His medicines always tend to be unclear as to what he is exactly taking. He has missed his follow up with Dr. [name] for his diabetes.   Last seen in February - total protein elevated - sent to hematology - concern for MM. Noted significant weight loss.   The patient does not have symptoms concerning for COVID-19 infection (fever, chills, cough, or new shortness of breath).   Comes in today. Here alone. He has gotten primary care - got a tetanus shot there. He has seen Dr. Irene Limbo for this concern for multiple myeloma. He is to have bone marrow biopsy - to be scheduled. Sounds like he saw Renal - do not have those records - tells me he was given Doxy for an abnormal urine. He has now retired  - says this will allow him to come to his appointments and take care of himself. He is getting his first COVID vaccine later this week.  Januvia is too cost prohibitive. He is not interested - nor sounds like he understands, how to talk with the insurance company about this.  I added Norvasc 5 mg at last visit - he never got a call from [facility] to let him know it was ready. Thus not taking. BP little better at the other office visits - not at goal but not as high as it is here.   Past Medical History:  Diagnosis Date  . Arthritis    "left knee" (11/24/2017)  . CAD (coronary artery disease)    CABG 2002  . CHF (congestive heart failure) ([place])   . Chronic renal insufficiency   . Diabetes mellitus without complication ([place])   . Dilated cardiomyopathy ([place])    echo in 2009 showed improvement with a normal EF  . HTN (hypertension)   . Hyperlipemia   . Noncompliance   . PAD (peripheral artery disease) ([place])     Past Surgical History:  Procedure Laterality Date  . CARDIAC CATHETERIZATION  2002, 2006  . CORONARY ARTERY BYPASS GRAFT  2002   x4 DR. VAN TRIGT     Medications: Current Meds  Medication Sig  . Accu-Chek FastClix Lancets MISC Use Accu Chek fastclix lancets to check blood sugar fasting or 2 hours after a meal  every other day.  [name] aspirin 325 MG EC tablet Take 325 mg by mouth daily.   [name] atorvastatin (LIPITOR) 80 MG tablet Take 1 tablet (80 mg total) by mouth daily.  . Blood Glucose Monitoring Suppl (ACCU-CHEK GUIDE ME) w/Device KIT 1 each by Does not apply route.  . carvedilol (COREG) 25 MG tablet Take 1 tablet (25 mg total) by mouth 2 (two) times daily with a meal.  . glucose blood (ACCU-CHEK GUIDE) test strip Use Accu Chek test strips as instructed to check blood sugar fasting or two hours after meals, every other day.  . hydrALAZINE (APRESOLINE) 50 MG tablet TAKE 1 TABLET BY MOUTH THREE TIMES DAILY  . ibuprofen (ADVIL,MOTRIN) 200 MG tablet Take 200 mg by mouth every 6 (six)  hours as needed for moderate pain (for knee).  . isosorbide mononitrate (IMDUR) 30 MG 24 hr tablet Take 1 tablet (30 mg total) by mouth daily.  [name] losartan (COZAAR) 100 MG tablet Take 1 tablet by mouth once daily  . [DISCONTINUED] furosemide (LASIX) 40 MG tablet Take 1 tablet (40 mg total) by mouth daily.     Allergies: No Known Allergies  Social History: The patient  reports that he has never smoked. He has never used smokeless tobacco. He reports that he does not drink alcohol or use drugs.   Family History: The patient's family history includes Diabetes in his mother; Hypertension in his father.   Review of Systems: Please see the history of present illness.   All other systems are reviewed and negative.   Physical Exam: VS:  BP (!) 170/100 Comment: right arm 150/98  Pulse 74   Ht 5' 9.5" (1.765 m)   Wt 175 lb 6.4 oz (79.6 kg)   SpO2 98%   BMI 25.53 kg/m  .  BMI Body mass index is 25.53 kg/m.  Wt Readings from Last 3 Encounters:  10/16/19 175 lb 6.4 oz (79.6 kg)  10/11/19 171 lb 8 oz (77.8 kg)  10/04/19 171 lb 4.8 oz (77.7 kg)    General: Pleasant. Alert and in no acute distress. Weight is stable.   HEENT: Normal.  Neck: Supple, no JVD, carotid bruits, or masses noted.  Cardiac: Regular rate and rhythm. No murmurs, rubs, or gallops. No edema.  Respiratory:  Lungs are clear to auscultation bilaterally with normal work of breathing.  GI: Soft and nontender.  MS: No deformity or atrophy. Gait and ROM intact.  Skin: Warm and dry. Color is normal.  Neuro:  Strength and sensation are intact and no gross focal deficits noted.  Psych: Alert, appropriate and with normal affect.   LABORATORY DATA:  EKG:  EKG is not ordered today.   Lab Results  Component Value Date   WBC 7.2 09/27/2019   HGB 9.1 (L) 09/27/2019   HCT 29.4 (L) 09/27/2019   PLT 306 09/27/2019   GLUCOSE 170 (H) 09/27/2019   CHOL 92 (L) 09/04/2019   TRIG 89 09/04/2019   HDL 35 (L) 09/04/2019    LDLDIRECT 149.4 01/07/2011   LDLCALC 39 09/04/2019   ALT 15 09/27/2019   AST 15 09/27/2019   NA 140 09/27/2019   K 4.4 09/27/2019   CL 109 09/27/2019   CREATININE 1.67 (H) 09/27/2019   BUN 21 09/27/2019   CO2 25 09/27/2019   TSH 0.592 09/04/2019   INR 1.37 11/24/2017   HGBA1C 7.8 (H) 09/04/2019   MICROALBUR 12.6 (H) 07/21/2018     BNP (last 3 results) No results for input(s): BNP in  the last 8760 hours.  ProBNP (last 3 results) No results for input(s): PROBNP in the last 8760 hours.   Other Studies Reviewed Today:  Echo 11/25/2017 LV EF: 50% - 55% Study Conclusions  - Left ventricle: The cavity size was normal. There was moderate concentric hypertrophy. Systolic function was normal. The estimated ejection fraction was in the range of 50% to 55%. Wall motion was normal; there were no regional wall motion abnormalities. Doppler parameters are consistent with abnormal left ventricular relaxation (grade 1 diastolic dysfunction). Doppler parameters are consistent with high ventricular filling pressure. - Aortic valve: Transvalvular velocity was within the normal range. There was no stenosis. There was no regurgitation. - Mitral valve: Transvalvular velocity was within the normal range. There was no evidence for stenosis. There was no regurgitation. - Left atrium: The atrium was moderately dilated. - Right ventricle: The cavity size was normal. Wall thickness was normal. Systolic function was normal. - Right atrium: The atrium was severely dilated. - Atrial septum: No defect or patent foramen ovale was identified by color flow Doppler. - Tricuspid valve: There was trivial regurgitation. - Pulmonary arteries: Systolic pressure was within the normal range. PA peak pressure: 36 mm Hg (S).   Assessment & Plan:  1. Possible multiple myeloma - he is in the process of being evaluated by Dr. Irene Limbo.   2. HTN - repeat today by me is 180/90 in both  arms - adding back the Norvasc 5 mg.   3. Chronic diastolic HF - needs good BP control - he has lost weight. Had worsening CKD - now off Lasix.    4. DM - per Dr. [name] - he is not able to afford the Januvia - I asked him to let Dr. [name] know.   5. CAD - he has no active symptoms - needs aggressive risk factor modification.   6. HLD - on statin - we did lab at last visit with me - would continue.   7. Long standing non compliance - hopefully this will improve now that he has stopped working, can come to his appointments, etc.   8. CKD - no longer on Lasix. Has seen Renal - trying to get records to review.   Addendum: - he has seen Dr. Pearson Grippe - office note received/reviewed.   9. COVID-19 Education: The signs and symptoms of COVID-19 were discussed with the patient and how to seek care for testing (follow up with PCP or arrange E-visit).  The importance of social distancing, staying at home, hand hygiene and wearing a mask when out in public were discussed today. Getting first COVID vaccine later this week.   Current medicines are reviewed with the patient today.  The patient does not have concerns regarding medicines other than what has been noted above.  The following changes have been made:  See above.  Labs/ tests ordered today include:   No orders of the defined types were placed in this encounter.    Disposition:   FU with me in 2 months.   Patient is agreeable to this plan and will call if any problems develop in the interim.   SignedTruitt Merle, NP  10/16/2019 4:36 PM  [facility] [address] [place], [place]  [address] Phone: [phone] Fax: [phone]

## 2019-10-11 NOTE — Telephone Encounter (Signed)
Scheduled per los. Gave avs and calendar  

## 2019-10-16 ENCOUNTER — Other Ambulatory Visit: Payer: Self-pay

## 2019-10-16 ENCOUNTER — Ambulatory Visit (INDEPENDENT_AMBULATORY_CARE_PROVIDER_SITE_OTHER): Payer: PRIVATE HEALTH INSURANCE | Admitting: Nurse Practitioner

## 2019-10-16 ENCOUNTER — Encounter: Payer: Self-pay | Admitting: Nurse Practitioner

## 2019-10-16 ENCOUNTER — Telehealth: Payer: Self-pay | Admitting: *Deleted

## 2019-10-16 VITALS — BP 170/100 | HR 74 | Ht 69.5 in | Wt 175.4 lb

## 2019-10-16 DIAGNOSIS — I259 Chronic ischemic heart disease, unspecified: Secondary | ICD-10-CM

## 2019-10-16 DIAGNOSIS — R778 Other specified abnormalities of plasma proteins: Secondary | ICD-10-CM

## 2019-10-16 DIAGNOSIS — E1169 Type 2 diabetes mellitus with other specified complication: Secondary | ICD-10-CM

## 2019-10-16 DIAGNOSIS — N184 Chronic kidney disease, stage 4 (severe): Secondary | ICD-10-CM | POA: Diagnosis not present

## 2019-10-16 DIAGNOSIS — Z7189 Other specified counseling: Secondary | ICD-10-CM

## 2019-10-16 DIAGNOSIS — R634 Abnormal weight loss: Secondary | ICD-10-CM | POA: Diagnosis not present

## 2019-10-16 DIAGNOSIS — I5032 Chronic diastolic (congestive) heart failure: Secondary | ICD-10-CM

## 2019-10-16 DIAGNOSIS — I10 Essential (primary) hypertension: Secondary | ICD-10-CM

## 2019-10-16 MED ORDER — AMLODIPINE BESYLATE 5 MG PO TABS: 5.0000 mg | ORAL_TABLET | Freq: Every day | ORAL | 3 refills | Status: DC

## 2019-10-16 NOTE — Telephone Encounter (Signed)
[name] at [facility] @ [phone] to receive fax from pt's last ov note.

## 2019-10-16 NOTE — Patient Instructions (Addendum)
After Visit Summary:  We will be checking the following labs today - NONE   Medication Instructions:    Continue with your current medicines. BUT  I am adding Norvasc 5 mg - take one a day - this is at the drug store  STAY OFF FUROSEMIDE    If you need a refill on your cardiac medications before your next appointment, please call your pharmacy.     Testing/Procedures To Be Arranged:  N/A  Follow-Up:   See me in about 2 months  Let Dr. [name] know that you cannot afford Januvia.     At [facility], you and your health needs are our priority.  As part of our continuing mission to provide you with exceptional heart care, we have created designated Provider Care Teams.  These Care Teams include your primary Cardiologist (physician) and Advanced Practice Providers (APPs -  Physician Assistants and Nurse Practitioners) who all work together to provide you with the care you need, when you need it.  Special Instructions:  . Stay safe, stay home, wash your hands for at least 20 seconds and wear a mask when out in public.  . It was good to talk with you today.    Call the [place] office at [phone] if you have any questions, problems or concerns.

## 2019-10-17 NOTE — Telephone Encounter (Signed)
Dayna faxed the ov note from [facility] to [place].

## 2019-10-19 ENCOUNTER — Ambulatory Visit: Payer: PRIVATE HEALTH INSURANCE | Attending: Internal Medicine

## 2019-10-19 DIAGNOSIS — Z23 Encounter for immunization: Secondary | ICD-10-CM

## 2019-10-19 NOTE — Progress Notes (Signed)
   Covid-19 Vaccination Clinic  Name:  [name]    MRN: [id number] DOB: Jan 20, 1956  10/19/2019  [name] was observed post Covid-19 immunization for 15 minutes without incident. He was provided with Vaccine Information Sheet and instruction to access the V-Safe system.   [name] was instructed to call 911 with any severe reactions post vaccine: [name] Difficulty breathing  . Swelling of face and throat  . A fast heartbeat  . A bad rash all over body  . Dizziness and weakness   Immunizations Administered    Name Date Dose VIS Date Route   Pfizer COVID-19 Vaccine 10/19/2019  1:10 PM 0.3 mL 06/23/2019 Intramuscular   Manufacturer: [place]   Lot: [id number]   [place]: [id number]

## 2019-10-22 ENCOUNTER — Other Ambulatory Visit: Payer: Self-pay | Admitting: Radiology

## 2019-10-23 ENCOUNTER — Telehealth: Payer: Self-pay | Admitting: *Deleted

## 2019-10-23 ENCOUNTER — Inpatient Hospital Stay: Payer: No Typology Code available for payment source | Admitting: Hematology

## 2019-10-23 NOTE — Telephone Encounter (Signed)
Per Dr.Kale - reschedule patient appt currently for 4/12 after patient has completed Bone marrow biopsy and PET scan as MD would like to have results prior to appt. Contacted patient with this information. He is in agreement. Appt for 4/12 cancelled. Schedule message sent

## 2019-10-24 ENCOUNTER — Other Ambulatory Visit: Payer: Self-pay

## 2019-10-24 ENCOUNTER — Other Ambulatory Visit: Payer: Self-pay | Admitting: Hematology

## 2019-10-24 ENCOUNTER — Ambulatory Visit (HOSPITAL_COMMUNITY)
Admission: RE | Admit: 2019-10-24 | Discharge: 2019-10-24 | Disposition: A | Discharge status: Home / Self Care | Payer: PRIVATE HEALTH INSURANCE | Source: Ambulatory Visit | Attending: Hematology | Admitting: Hematology

## 2019-10-24 DIAGNOSIS — N189 Chronic kidney disease, unspecified: Secondary | ICD-10-CM | POA: Insufficient documentation

## 2019-10-24 DIAGNOSIS — I420 Dilated cardiomyopathy: Secondary | ICD-10-CM | POA: Insufficient documentation

## 2019-10-24 DIAGNOSIS — I2510 Atherosclerotic heart disease of native coronary artery without angina pectoris: Secondary | ICD-10-CM | POA: Insufficient documentation

## 2019-10-24 DIAGNOSIS — D649 Anemia, unspecified: Secondary | ICD-10-CM | POA: Insufficient documentation

## 2019-10-24 DIAGNOSIS — E1122 Type 2 diabetes mellitus with diabetic chronic kidney disease: Secondary | ICD-10-CM | POA: Insufficient documentation

## 2019-10-24 DIAGNOSIS — E1151 Type 2 diabetes mellitus with diabetic peripheral angiopathy without gangrene: Secondary | ICD-10-CM | POA: Insufficient documentation

## 2019-10-24 DIAGNOSIS — C9000 Multiple myeloma not having achieved remission: Secondary | ICD-10-CM | POA: Insufficient documentation

## 2019-10-24 DIAGNOSIS — Z79899 Other long term (current) drug therapy: Secondary | ICD-10-CM | POA: Insufficient documentation

## 2019-10-24 DIAGNOSIS — Z951 Presence of aortocoronary bypass graft: Secondary | ICD-10-CM | POA: Insufficient documentation

## 2019-10-24 DIAGNOSIS — I130 Hypertensive heart and chronic kidney disease with heart failure and stage 1 through stage 4 chronic kidney disease, or unspecified chronic kidney disease: Secondary | ICD-10-CM | POA: Insufficient documentation

## 2019-10-24 DIAGNOSIS — E785 Hyperlipidemia, unspecified: Secondary | ICD-10-CM | POA: Insufficient documentation

## 2019-10-24 DIAGNOSIS — I509 Heart failure, unspecified: Secondary | ICD-10-CM | POA: Insufficient documentation

## 2019-10-24 DIAGNOSIS — Z7982 Long term (current) use of aspirin: Secondary | ICD-10-CM | POA: Insufficient documentation

## 2019-10-24 HISTORY — PX: IR FLUORO GUIDED NEEDLE PLC ASPIRATION/INJECTION LOC: IMG2395

## 2019-10-24 LAB — CBC WITH DIFFERENTIAL/PLATELET
Abs Immature Granulocytes: 0.01 10*3/uL (ref 0.00–0.07)
Basophils Absolute: 0.1 10*3/uL (ref 0.0–0.1)
Basophils Relative: 1 %
Eosinophils Absolute: 0.1 10*3/uL (ref 0.0–0.5)
Eosinophils Relative: 3 %
HCT: 30.7 % — ABNORMAL LOW (ref 39.0–52.0)
Hemoglobin: 9.3 g/dL — ABNORMAL LOW (ref 13.0–17.0)
Immature Granulocytes: 0 %
Lymphocytes Relative: 29 %
Lymphs Abs: 1.5 10*3/uL (ref 0.7–4.0)
MCH: 27.7 pg (ref 26.0–34.0)
MCHC: 30.3 g/dL (ref 30.0–36.0)
MCV: 91.4 fL (ref 80.0–100.0)
Monocytes Absolute: 0.6 10*3/uL (ref 0.1–1.0)
Monocytes Relative: 11 %
Neutro Abs: 2.9 10*3/uL (ref 1.7–7.7)
Neutrophils Relative %: 56 %
Platelets: 220 10*3/uL (ref 150–400)
RBC: 3.36 MIL/uL — ABNORMAL LOW (ref 4.22–5.81)
RDW: 18.6 % — ABNORMAL HIGH (ref 11.5–15.5)
WBC: 5.1 10*3/uL (ref 4.0–10.5)
nRBC: 0 % (ref 0.0–0.2)

## 2019-10-24 LAB — PROTIME-INR
INR: 1.2 (ref 0.8–1.2)
Prothrombin Time: 15.1 seconds (ref 11.4–15.2)

## 2019-10-24 LAB — BASIC METABOLIC PANEL
Anion gap: 8 (ref 5–15)
BUN: 18 mg/dL (ref 8–23)
CO2: 24 mmol/L (ref 22–32)
Calcium: 9.1 mg/dL (ref 8.9–10.3)
Chloride: 108 mmol/L (ref 98–111)
Creatinine, Ser: 1.39 mg/dL — ABNORMAL HIGH (ref 0.61–1.24)
GFR calc Af Amer: >60 mL/min (ref >60)
GFR calc non Af Amer: 53 mL/min — ABNORMAL LOW (ref >60)
Glucose, Bld: 129 mg/dL — ABNORMAL HIGH (ref 70–99)
Potassium: 3.6 mmol/L (ref 3.5–5.1)
Sodium: 140 mmol/L (ref 135–145)

## 2019-10-24 LAB — GLUCOSE, CAPILLARY: Glucose-Capillary: 114 mg/dL — ABNORMAL HIGH (ref 70–99)

## 2019-10-24 MED ORDER — FENTANYL CITRATE (PF) 100 MCG/2ML IJ SOLN
INTRAMUSCULAR | Status: AC
  Filled 2019-10-24: qty 2

## 2019-10-24 MED ORDER — LIDOCAINE HCL 1 % IJ SOLN
INTRAMUSCULAR | Status: AC
  Filled 2019-10-24: qty 20

## 2019-10-24 MED ORDER — FENTANYL CITRATE (PF) 100 MCG/2ML IJ SOLN
INTRAMUSCULAR | Status: AC | PRN
  Administered 2019-10-24 (×2): 50 ug via INTRAVENOUS

## 2019-10-24 MED ORDER — MIDAZOLAM HCL 2 MG/2ML IJ SOLN
INTRAMUSCULAR | Status: AC
  Filled 2019-10-24: qty 4

## 2019-10-24 MED ORDER — LIDOCAINE HCL (PF) 1 % IJ SOLN
INTRAMUSCULAR | Status: AC | PRN
  Administered 2019-10-24: 10 mL

## 2019-10-24 MED ORDER — MIDAZOLAM HCL 2 MG/2ML IJ SOLN
INTRAMUSCULAR | Status: AC | PRN
  Administered 2019-10-24 (×2): 1 mg via INTRAVENOUS

## 2019-10-24 MED ORDER — SODIUM CHLORIDE 0.9 % IV SOLN: INTRAVENOUS | Status: DC

## 2019-10-24 NOTE — H&P (Signed)
Chief Complaint: Patient was seen in consultation today for bone marrow biopsy  Referring Physician(s): Brunetta Genera  Supervising Physician: Corrie Mckusick  Patient Status: [facility] - Out-pt  History of Present Illness: [name] is a 64 y.o. male with a past medical history significant for arthritis, CAD s/p CABG, CHF, dilated cardiomyopathy, PAD, HTN, DM, CKD, anemia and monoclonal paraproteinemia followed by Dr. Irene Limbo who presents today for a bone marrow biopsy. [name] was first seen by Dr. Irene Limbo in March of this year after he was noted by is cardiologist to have increasing total protein, worsening anemia and worsening kidney function concerning for possible multiple myeloma. IR has been asked to perform a bone marrow biopsy for further evaluation.  [name] denies any complaints today except that he is a little cold here in the hospital. He states he was told about the bone marrow biopsy before he came and feels he has a good understanding of the procedure and is agreeable to proceed.  Past Medical History:  Diagnosis Date  . Arthritis    "left knee" (11/24/2017)  . CAD (coronary artery disease)    CABG 2002  . CHF (congestive heart failure) ([place])   . Chronic renal insufficiency   . Diabetes mellitus without complication ([place])   . Dilated cardiomyopathy ([place])    echo in 2009 showed improvement with a normal EF  . HTN (hypertension)   . Hyperlipemia   . Noncompliance   . PAD (peripheral artery disease) ([place])     Past Surgical History:  Procedure Laterality Date  . CARDIAC CATHETERIZATION  2002, 2006  . CORONARY ARTERY BYPASS GRAFT  2002   x4 DR. VAN TRIGT    Allergies: Patient has no known allergies.  Medications: Prior to Admission medications   Medication Sig Start Date End Date Taking? Authorizing Provider  amLODipine (NORVASC) 5 MG tablet Take 1 tablet (5 mg total) by mouth daily. 10/16/19 01/14/20 Yes Burtis Junes, NP  aspirin 325 MG EC  tablet Take 325 mg by mouth daily.    Yes [provider]  atorvastatin (LIPITOR) 80 MG tablet Take 1 tablet (80 mg total) by mouth daily. 01/24/19  Yes Burtis Junes, NP  carvedilol (COREG) 25 MG tablet Take 1 tablet (25 mg total) by mouth 2 (two) times daily with a meal. 01/24/19  Yes Burtis Junes, NP  hydrALAZINE (APRESOLINE) 50 MG tablet TAKE 1 TABLET BY MOUTH THREE TIMES DAILY 05/08/19  Yes Burtis Junes, NP  ibuprofen (ADVIL,MOTRIN) 200 MG tablet Take 200 mg by mouth every 6 (six) hours as needed for moderate pain (for knee).   Yes [provider]  isosorbide mononitrate (IMDUR) 30 MG 24 hr tablet Take 1 tablet (30 mg total) by mouth daily. 01/24/19  Yes Burtis Junes, NP  losartan (COZAAR) 100 MG tablet Take 1 tablet by mouth once daily 07/10/19  Yes Burtis Junes, NP  Accu-Chek FastClix Lancets MISC Use Accu Chek fastclix lancets to check blood sugar fasting or 2 hours after a meal every other day. 08/18/19   Elayne Snare, MD  Blood Glucose Monitoring Suppl (ACCU-CHEK GUIDE ME) w/Device KIT 1 each by Does not apply route.    [provider]  glucose blood (ACCU-CHEK GUIDE) test strip Use Accu Chek test strips as instructed to check blood sugar fasting or two hours after meals, every other day. 08/18/19   Elayne Snare, MD     Family History  Problem Relation Age of Onset  .  Hypertension Father   . Diabetes Mother        DIABETIC COMA    Social History   Socioeconomic History  . Marital status: Married    Spouse name: Not on file  . Number of children: 1  . Years of education: Not on file  . Highest education level: Not on file  Occupational History  . Occupation: Furniture conservator/restorer  Tobacco Use  . Smoking status: Never Smoker  . Smokeless tobacco: Never Used  Substance and Sexual Activity  . Alcohol use: No  . Drug use: No  . Sexual activity: Yes  Other Topics Concern  . Not on file  Social History Narrative  . Not on file   Social Determinants  of Health   Financial Resource Strain:   . Difficulty of Paying Living Expenses:   Food Insecurity:   . Worried About Charity fundraiser in the Last Year:   . Arboriculturist in the Last Year:   Transportation Needs:   . Film/video editor (Medical):   [name] Lack of Transportation (Non-Medical):   Physical Activity:   . Days of Exercise per Week:   . Minutes of Exercise per Session:   Stress:   . Feeling of Stress :   Social Connections:   . Frequency of Communication with Friends and Family:   . Frequency of Social Gatherings with Friends and Family:   . Attends Religious Services:   . Active Member of Clubs or Organizations:   . Attends Archivist Meetings:   [name] Marital Status:      Review of Systems: A 12 point ROS discussed and pertinent positives are indicated in the HPI above.  All other systems are negative.  Review of Systems  Constitutional: Negative for appetite change, chills and fever.  Respiratory: Negative for cough and shortness of breath.   Cardiovascular: Negative for chest pain.  Gastrointestinal: Negative for abdominal pain, diarrhea, nausea and vomiting.  Musculoskeletal: Positive for arthralgias. Negative for back pain.  Neurological: Negative for dizziness and headaches.    Vital Signs: There were no vitals taken for this visit.  Physical Exam Vitals reviewed.  Constitutional:      General: He is not in acute distress. HENT:     Head: Normocephalic.     Mouth/Throat:     Mouth: Mucous membranes are moist.     Pharynx: Oropharynx is clear. No oropharyngeal exudate or posterior oropharyngeal erythema.  Cardiovascular:     Rate and Rhythm: Normal rate and regular rhythm.  Pulmonary:     Effort: Pulmonary effort is normal.     Breath sounds: Normal breath sounds.  Abdominal:     General: There is no distension.     Palpations: Abdomen is soft.     Tenderness: There is no abdominal tenderness.  Skin:    General: Skin is warm and  dry.  Neurological:     Mental Status: He is alert and oriented to person, place, and time.  Psychiatric:        Mood and Affect: Mood normal.        Behavior: Behavior normal.        Thought Content: Thought content normal.        Judgment: Judgment normal.      MD Evaluation Airway: WNL Heart: WNL Abdomen: WNL Chest/ Lungs: WNL ASA  Classification: 2 Mallampati/Airway Score: Two   Imaging: DG Bone Survey Met  Result Date: 09/29/2019 CLINICAL DATA:  Myeloma staging. Anemia. EXAM:  METASTATIC BONE SURVEY COMPARISON:  Chest radiograph 11/25/2017. FINDINGS: No discrete destructive lytic or focal blastic lesions in the axial or appendicular skeleton. Large flowing anterior osteophytes in the cervical and thoracic spine consistent with diffuse idiopathic skeletal hyperostosis. Large anterior spurs in the lumbar spine. Peripheral vascular calcifications with surgical clips in the right lower extremity and right forearm soft tissues. Multifocal osteoarthritis involving the joints of the upper and lower extremities. Patient is post median sternotomy and CABG. The heart is normal in size. 6 mm nodule in the left mid lung may represent calcified granuloma, and is unchanged from 2019 radiograph. IMPRESSION: No radiographic evidence of focal lesion in the axial or appendicular skeleton. Electronically Signed   By: Keith Rake M.D.   On: 09/29/2019 14:11    Labs:  CBC: Recent Labs    01/24/19 1141 09/04/19 1631 09/27/19 1116  WBC 7.0 7.0 7.2  HGB 10.2* 9.8* 9.1*  HCT 30.4* 29.8* 29.4*  PLT 208 309 306    COAGS: No results for input(s): INR, APTT in the last 8760 hours.  BMP: Recent Labs    01/24/19 1141 01/24/19 1141 03/10/19 1505 05/23/19 1508 09/04/19 1631 09/27/19 1116  NA 140  --  139 138 137 140  K 3.9   < > 3.8 3.5 4.1 4.4  CL 103   < > 107 107 106 109  CO2 23   < > 27 26 15* 25  GLUCOSE 118*  --  107* 149* 129* 170*  BUN 27  --  36* 30* 64* 21  CALCIUM 9.5    < > 9.4 9.4 9.3 9.0  CREATININE 1.72*   < > 1.85* 1.72* 2.62* 1.67*  GFRNONAA 41*  --   --   --  25* 43*  GFRAA 48*  --   --   --  29* 49*   < > = values in this interval not displayed.    LIVER FUNCTION TESTS: Recent Labs    01/24/19 1141 05/23/19 1508 09/04/19 1631 09/27/19 1116  BILITOT 0.7 0.7 0.4 0.5  AST '18 17 30 15  '$ ALT 15 13 45* 15  ALKPHOS 135* 147* 163* 132*  PROT 9.1* 9.4* 10.0* 10.0*  ALBUMIN 4.1 3.9 3.6* 3.2*    TUMOR MARKERS: No results for input(s): AFPTM, CEA, CA199, CHROMGRNA in the last 8760 hours.  Assessment and Plan:  64 y/o M with monoclonal paraproteinemia concerning for multiple myeloma followed by Dr. Irene Limbo who presents today for a bone marrow biopsy.  Patient has been NPO since 8 pm last night, WBC 5.1, hgb 9.3, plt 220, INR 1.2.  Risks and benefits of bone marrow biopsy was discussed with the patient and/or patient's family including, but not limited to bleeding, infection, damage to adjacent structures or low yield requiring additional tests.  All of the questions were answered and there is agreement to proceed.  Consent signed and in chart.   Thank you for this interesting consult.  I greatly enjoyed meeting [name] and look forward to participating in their care.  A copy of this report was sent to the requesting provider on this date.  Electronically Signed: Joaquim Nam, PA-C 10/24/2019, 8:05 AM   I spent a total of30 Minutes in face to face in clinical consultation, greater than 50% of which was counseling/coordinating care for bone marrow biopsy.

## 2019-10-24 NOTE — Discharge Instructions (Signed)
For questions/ concerns of Biopsy site- can contact [facility] Interventional Radiology staff  [phone]  Bone Marrow Aspiration and Bone Marrow Biopsy, Adult, Care After This sheet gives you information about how to care for yourself after your procedure. Your health care provider may also give you more specific instructions. If you have problems or questions, contact your health care provider. What can I expect after the procedure? After the procedure, it is common to have:  Mild pain and tenderness.  Swelling.  Bruising. Follow these instructions at home: Puncture site care   Follow instructions from your health care provider about how to take care of the puncture site. Make sure you: ? Wash your hands with soap and water before and after you change your bandage (dressing). If soap and water are not available, use hand sanitizer. ? Change your dressing as told by your health care provider.  Check your puncture site every day for signs of infection. Check for: ? More redness, swelling, or pain. ? Fluid or blood. ? Warmth. ? Pus or a bad smell. Activity  Return to your normal activities as told by your health care provider. Ask your health care provider what activities are safe for you.  Do not lift anything that is heavier than 10 lb (4.5 kg), or the limit that you are told, until your health care provider says that it is safe.  Do not drive for 24 hours if you were given a sedative during your procedure. General instructions   Take over-the-counter and prescription medicines only as told by your health care provider.  Do not take baths, swim, or use a hot tub until your health care provider approves. Ask your health care provider if you may take showers. You may only be allowed to take sponge baths.  If directed, put ice on the affected area. To do this: ? Put ice in a plastic bag. ? Place a towel between your skin and the bag. ? Leave the ice on for 20 minutes, 2-3 times  a day.  Keep all follow-up visits as told by your health care provider. This is important. Contact a health care provider if:  Your pain is not controlled with medicine.  You have a fever.  You have more redness, swelling, or pain around the puncture site.  You have fluid or blood coming from the puncture site.  Your puncture site feels warm to the touch.  You have pus or a bad smell coming from the puncture site. Summary  After the procedure, it is common to have mild pain, tenderness, swelling, and bruising.  Follow instructions from your health care provider about how to take care of the puncture site and what activities are safe for you.  Take over-the-counter and prescription medicines only as told by your health care provider.  Contact a health care provider if you have any signs of infection, such as fluid or blood coming from the puncture site. This information is not intended to replace advice given to you by your health care provider. Make sure you discuss any questions you have with your health care provider. Document Revised: 11/15/2018 Document Reviewed: 11/15/2018 Elsevier Patient Education  [place]. Moderate Conscious Sedation, Adult, Care After These instructions provide you with information about caring for yourself after your procedure. Your health care provider may also give you more specific instructions. Your treatment has been planned according to current medical practices, but problems sometimes occur. Call your health care provider if you have any problems  or questions after your procedure. What can I expect after the procedure? After your procedure, it is common:  To feel sleepy for several hours.  To feel clumsy and have poor balance for several hours.  To have poor judgment for several hours.  To vomit if you eat too soon. Follow these instructions at home: For at least 24 hours after the procedure:   Do not: ? Participate in  activities where you could fall or become injured. ? Drive. ? Use heavy machinery. ? Drink alcohol. ? Take sleeping pills or medicines that cause drowsiness. ? Make important decisions or sign legal documents. ? Take care of children on your own.  Rest. Eating and drinking  Follow the diet recommended by your health care provider.  If you vomit: ? Drink water, juice, or soup when you can drink without vomiting. ? Make sure you have little or no nausea before eating solid foods. General instructions  Have a responsible adult stay with you until you are awake and alert.  Take over-the-counter and prescription medicines only as told by your health care provider.  If you smoke, do not smoke without supervision.  Keep all follow-up visits as told by your health care provider. This is important. Contact a health care provider if:  You keep feeling nauseous or you keep vomiting.  You feel light-headed.  You develop a rash.  You have a fever. Get help right away if:  You have trouble breathing. This information is not intended to replace advice given to you by your health care provider. Make sure you discuss any questions you have with your health care provider. Document Revised: 06/11/2017 Document Reviewed: 10/19/2015 Elsevier Patient Education  2020 [employer].

## 2019-10-24 NOTE — Procedures (Signed)
Interventional Radiology Procedure Note  Procedure:  Image guided aspirate and core biopsy of right posterior iliac bone Complications: None Recommendations: - Bedrest supine x 1 hrs - OTC's PRN  Pain - Follow biopsy results  Signed,  Uriel Dowding S. Sahith Nurse, DO   

## 2019-10-25 ENCOUNTER — Ambulatory Visit (HOSPITAL_COMMUNITY)
Admission: RE | Admit: 2019-10-25 | Discharge: 2019-10-25 | Disposition: A | Discharge status: Home / Self Care | Payer: PRIVATE HEALTH INSURANCE | Source: Ambulatory Visit | Attending: Hematology | Admitting: Hematology

## 2019-10-25 DIAGNOSIS — D649 Anemia, unspecified: Secondary | ICD-10-CM | POA: Insufficient documentation

## 2019-10-25 DIAGNOSIS — C9000 Multiple myeloma not having achieved remission: Secondary | ICD-10-CM | POA: Diagnosis present

## 2019-10-25 LAB — GLUCOSE, CAPILLARY: Glucose-Capillary: 91 mg/dL (ref 70–99)

## 2019-10-25 MED ORDER — FLUDEOXYGLUCOSE F - 18 (FDG) INJECTION
9.2200 | Freq: Once | INTRAVENOUS | Status: AC | PRN
  Administered 2019-10-25: 9.22 via INTRAVENOUS

## 2019-10-30 ENCOUNTER — Encounter (HOSPITAL_COMMUNITY): Payer: Self-pay | Admitting: Hematology

## 2019-10-31 ENCOUNTER — Other Ambulatory Visit: Payer: Self-pay

## 2019-10-31 ENCOUNTER — Inpatient Hospital Stay: Payer: No Typology Code available for payment source | Attending: Hematology | Admitting: Hematology

## 2019-10-31 VITALS — BP 155/90 | HR 73 | Temp 98.0°F | Resp 18 | Ht 69.5 in | Wt 177.1 lb

## 2019-10-31 DIAGNOSIS — I129 Hypertensive chronic kidney disease with stage 1 through stage 4 chronic kidney disease, or unspecified chronic kidney disease: Secondary | ICD-10-CM | POA: Diagnosis not present

## 2019-10-31 DIAGNOSIS — N189 Chronic kidney disease, unspecified: Secondary | ICD-10-CM | POA: Diagnosis not present

## 2019-10-31 DIAGNOSIS — Z79899 Other long term (current) drug therapy: Secondary | ICD-10-CM | POA: Insufficient documentation

## 2019-10-31 DIAGNOSIS — I2510 Atherosclerotic heart disease of native coronary artery without angina pectoris: Secondary | ICD-10-CM | POA: Insufficient documentation

## 2019-10-31 DIAGNOSIS — E1122 Type 2 diabetes mellitus with diabetic chronic kidney disease: Secondary | ICD-10-CM | POA: Diagnosis not present

## 2019-10-31 DIAGNOSIS — E785 Hyperlipidemia, unspecified: Secondary | ICD-10-CM | POA: Diagnosis not present

## 2019-10-31 DIAGNOSIS — Z7189 Other specified counseling: Secondary | ICD-10-CM | POA: Diagnosis not present

## 2019-10-31 DIAGNOSIS — D472 Monoclonal gammopathy: Secondary | ICD-10-CM | POA: Insufficient documentation

## 2019-10-31 DIAGNOSIS — D649 Anemia, unspecified: Secondary | ICD-10-CM | POA: Diagnosis not present

## 2019-10-31 DIAGNOSIS — C9000 Multiple myeloma not having achieved remission: Secondary | ICD-10-CM

## 2019-10-31 DIAGNOSIS — M1990 Unspecified osteoarthritis, unspecified site: Secondary | ICD-10-CM | POA: Insufficient documentation

## 2019-10-31 NOTE — Progress Notes (Signed)
HEMATOLOGY/ONCOLOGY CONSULTATION NOTE  Date of Service: 10/31/2019  Patient Care Team: Isaac Bliss, Rayford Halsted, MD as PCP - General (Internal Medicine) Martinique, Peter M, MD as PCP - Cardiology (Cardiology)  CHIEF COMPLAINTS/PURPOSE OF CONSULTATION:  Newly diagnosed myeloma  HISTORY OF PRESENTING ILLNESS:  [name] is a wonderful 64 y.o. male who has been referred to [place] by Dr Servando Snare for evaluation and management of elevated protein. Pt is accompanied today by his wife, Mrs. Holleran. The pt reports that he is doing well overall.   The pt reports he was first told that he had Diabetes two years ago. He has not previously needed to be on medications for his diabetes but has recently been started on Januvia by Dr. Elayne Snare, his Endocrinologist. His blood glucose was 149 this morning. He sees Burtis Junes - NP and Dr. Martinique for Cardiology. Pt has CAD and had to have open heart surgery 20 years ago. He also has HTN that he feels has been stable. His Cardiology team is currently managing his heart medications and recently told pt to discontinue taking Furosemide due kidney function on last labs. Dr. Martinique and Truitt Merle have been essentially functioning as his primary care, as he does not have a PCP at this time. He has an upcoming appointment with Truitt Merle in the beginning of May and Dr. [name] next Tuesday. Pt is currently using Ibuprofen a couple of times per week for his knee pain.  Pt has felt the same in the last 3-6 months and denies any new bone pain of any kinds. He is eating, functioning, and moving about as normal. He has lost about 13 lbs over the last few months. Pt does not smoke and does not drink much alcohol.   Most recent lab results (09/04/2019) of CBC, BMP and Hepatic function panel is as follows: all values are WNL except for RBC at 3.64, Hgb at 9.8, HCT at 29.8, Glucose at 129, BUN at 64, Creatinine at 2.62, GFR Est Af Am at 29, CO2 at 15, Total  Protein at 10.0, Albumin at 3.6, ALP at 163, ALT at 45.   On review of systems, pt reports unexpected weight loss, left knee pain and denies new back pain, new shoulder pain, new hip pain, chest pain, SOB, leg swelling, testicular pain/swelling and any other symptoms.   On PMHx the pt reports Left Knee Arthritis, CAD, CHF, Type II Diabetes, HTN, HLD, Cardiac Catheterization, Coronary Artery Bypass Graft x4 (2002). On Social Hx the pt reports that he is a non-smoker and does not drink much alcohol.  INTERVAL HISTORY:   [name] is a wonderful 64 y.o. male is here for evaluation and management of newly diagnosed myeloma. We are joined by pt's wife, Mrs. Vandyne, today. The patient's last visit with [place] was on 10/11/2019. The pt reports that he is doing well overall.  The pt reports that he is not up-to-date with his prostate cancer screenings with his PCP. Pt denies any history of neuropathy. He has had two hospital admissions for heart failure, the last was in 2019. Pt has had one dose of the COVID19 vaccine and has the second dose scheduled for the beginning of May.   Of note since the patient's last visit, pt has had PET/CT ([id number]) completed on 10/25/2019 with results revealing "1. No specific myelomatous lesions are identified. 2. Accentuated metabolic activity along capsular/synovial margins including the shoulders, elbows, wrists, knees, ankles, and portions of the hands  and feet compatible with arthropathy. There is some associated chondrocalcinosis and spurring. 3. Other imaging findings of potential clinical significance: Chronic right maxillary sinusitis. Aortic Atherosclerosis (ICD10-I70.0). Coronary atherosclerosis. Mild cardiomegaly. Prior CABG. Diffusely thick-walled urinary bladder, query cystitis. 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 The concurrent IGH probes confirmed the IGH gene rearrangement."   Lab results (10/24/19) of CBC w/diff and BMP is as follows: all values are WNL except for RBC at 3.36, Hgb at 9.3, HCT at 30.7, RDW at 18.6, Glucose at 129, Creatinine at 1.39.  On review of systems, pt denies abdominal pain, leg swelling, dysuria, hematuria any other symptoms.   MEDICAL HISTORY:  Past Medical History:  Diagnosis Date  . Arthritis    "left knee" (11/24/2017)  . CAD (coronary artery disease)    CABG 2002  . CHF (congestive heart failure) ([place])   . Chronic renal insufficiency   . Diabetes mellitus without complication ([place])   . Dilated cardiomyopathy ([place])    echo in 2009 showed improvement with a normal EF  . HTN (hypertension)   . Hyperlipemia   . Noncompliance   . PAD (peripheral artery disease) ([place])     SURGICAL HISTORY: Past Surgical History:  Procedure Laterality Date  . CARDIAC CATHETERIZATION  2002, 2006  . CORONARY ARTERY BYPASS GRAFT  2002   x4 DR. VAN TRIGT  . IR FLUORO GUIDED NEEDLE PLC ASPIRATION/INJECTION LOC  10/24/2019    SOCIAL HISTORY: Social History   Socioeconomic History  . Marital status: Married    Spouse name: Not on file  . Number of children: 1  . Years of education: Not on file  . Highest education level: Not on file  Occupational History  . Occupation: Furniture conservator/restorer  Tobacco Use  . Smoking status: Never Smoker  . Smokeless tobacco: Never Used  Substance and Sexual Activity  . Alcohol use: No  . Drug use: No  . Sexual activity: Yes  Other Topics Concern  . Not on file  Social History Narrative  . Not on file   Social  Determinants of Health   Financial Resource Strain:   . Difficulty of Paying Living Expenses:   Food Insecurity:   . Worried About Charity fundraiser in the Last Year:   . Arboriculturist in the Last Year:   Transportation Needs:   . Film/video editor (Medical):   [name] Lack of Transportation (Non-Medical):   Physical Activity:   . Days of Exercise per Week:   . Minutes of Exercise per Session:   Stress:   . Feeling of Stress :   Social Connections:   . Frequency of Communication with Friends and Family:   . Frequency of Social Gatherings with Friends and Family:   . Attends Religious Services:   . Active Member of Clubs or Organizations:   . Attends Archivist Meetings:   [name] Marital Status:   Intimate Partner Violence:   . Fear of Current or Ex-Partner:   . Emotionally Abused:   [name] Physically Abused:   . Sexually Abused:  FAMILY HISTORY: Family History  Problem Relation Age of Onset  . Hypertension Father   . Diabetes Mother        DIABETIC COMA    ALLERGIES:  has No Known Allergies.  MEDICATIONS:  Current Outpatient Medications  Medication Sig Dispense Refill  . Accu-Chek FastClix Lancets MISC Use Accu Chek fastclix lancets to check blood sugar fasting or 2 hours after a meal every other day. 100 each 2  . amLODipine (NORVASC) 5 MG tablet Take 1 tablet (5 mg total) by mouth daily. 90 tablet 3  . aspirin 325 MG EC tablet Take 325 mg by mouth daily.     [name] atorvastatin (LIPITOR) 80 MG tablet Take 1 tablet (80 mg total) by mouth daily. 90 tablet 3  . Blood Glucose Monitoring Suppl (ACCU-CHEK GUIDE ME) w/Device KIT 1 each by Does not apply route.    . carvedilol (COREG) 25 MG tablet Take 1 tablet (25 mg total) by mouth 2 (two) times daily with a meal. 180 tablet 3  . glucose blood (ACCU-CHEK GUIDE) test strip Use Accu Chek test strips as instructed to check blood sugar fasting or two hours after meals, every other day. 100 each 2  . hydrALAZINE (APRESOLINE)  50 MG tablet TAKE 1 TABLET BY MOUTH THREE TIMES DAILY 270 tablet 2  . ibuprofen (ADVIL,MOTRIN) 200 MG tablet Take 200 mg by mouth every 6 (six) hours as needed for moderate pain (for knee).    . isosorbide mononitrate (IMDUR) 30 MG 24 hr tablet Take 1 tablet (30 mg total) by mouth daily. 90 tablet 3  . losartan (COZAAR) 100 MG tablet Take 1 tablet by mouth once daily 90 tablet 1   No current facility-administered medications for this visit.    REVIEW OF SYSTEMS:   A 10+ POINT REVIEW OF SYSTEMS WAS OBTAINED including neurology, dermatology, psychiatry, cardiac, respiratory, lymph, extremities, GI, GU, Musculoskeletal, constitutional, breasts, reproductive, HEENT.  All pertinent positives are noted in the HPI.  All others are negative.   PHYSICAL EXAMINATION: ECOG PERFORMANCE STATUS: 0 - Asymptomatic  . Vitals:   10/31/19 1011  BP: (!) 155/90  Pulse: 73  Resp: 18  Temp: 98 F (36.7 C)  SpO2: 100%   Filed Weights   10/31/19 1011  Weight: 177 lb 1.6 oz (80.3 kg)   .Body mass index is 25.78 kg/m.  Exam was given in a chair   GENERAL:alert, in no acute distress and comfortable SKIN: no acute rashes, no significant lesions EYES: conjunctiva are pink and non-injected, sclera anicteric OROPHARYNX: MMM, no exudates, no oropharyngeal erythema or ulceration NECK: supple, no JVD LYMPH:  no palpable lymphadenopathy in the cervical, axillary or inguinal regions LUNGS: clear to auscultation b/l with normal respiratory effort HEART: regular rate & rhythm ABDOMEN:  normoactive bowel sounds , non tender, not distended. No palpable hepatosplenomegaly.  Extremity: no pedal edema PSYCH: alert & oriented x 3 with fluent speech NEURO: no focal motor/sensory deficits  LABORATORY DATA:  I have reviewed the data as listed  . CBC Latest Ref Rng & Units 10/24/2019 09/27/2019 09/04/2019  WBC 4.0 - 10.5 K/uL 5.1 7.2 7.0  Hemoglobin 13.0 - 17.0 g/dL 9.3(L) 9.1(L) 9.8(L)  Hematocrit 39.0 - 52.0 %  30.7(L) 29.4(L) 29.8(L)  Platelets 150 - 400 K/uL 220 306 309    . CMP Latest Ref Rng & Units 10/24/2019 09/27/2019 09/04/2019  Glucose 70 - 99 mg/dL 129(H) 170(H) 129(H)  BUN 8 - 23 mg/dL 18 21 64(H)  Creatinine 0.61 - 1.24  mg/dL 1.39(H) 1.67(H) 2.62(H)  Sodium 135 - 145 mmol/L 140 140 137  Potassium 3.5 - 5.1 mmol/L 3.6 4.4 4.1  Chloride 98 - 111 mmol/L 108 109 106  CO2 22 - 32 mmol/L 24 25 15(L)  Calcium 8.9 - 10.3 mg/dL 9.1 9.0 9.3  Total Protein 6.5 - 8.1 g/dL - 10.0(H) 10.0(H)  Total Bilirubin 0.3 - 1.2 mg/dL - 0.5 0.4  Alkaline Phos 38 - 126 U/L - 132(H) 163(H)  AST 15 - 41 U/L - 15 30  ALT 0 - 44 U/L - 15 45(H)   10/24/2019 FISH Panel:    10/24/2019 BM Bx Surgical Pathology:    RADIOGRAPHIC STUDIES: I have personally reviewed the radiological images as listed and agreed with the findings in the report. NM PET Image Initial (PI) Whole Body  Result Date: 10/26/2019 CLINICAL DATA:  Initial treatment strategy for multiple myeloma. EXAM: NUCLEAR MEDICINE PET WHOLE BODY TECHNIQUE: 0.6 mCi F-18 FDG was injected intravenously. Full-ring PET imaging was performed from the skull vertex through the feet after the radiotracer. CT data was obtained and used for attenuation correction and anatomic localization. Fasting blood glucose: 91 mg/dl COMPARISON:  Bone survey of 09/29/2019 FINDINGS: Mediastinal blood pool activity: SUV max 2.3 HEAD/NECK: INSERT skip region Incidental CT findings: Mild chronic right maxillary sinusitis. CHEST: No significant abnormal hypermetabolic activity in this region. Incidental CT findings: Mild cardiomegaly. Coronary, aortic arch, and branch vessel atherosclerotic vascular disease. Prior CABG. Dependent subsegmental atelectasis in the lower lobes. ABDOMEN/PELVIS: Hyperdense exophytic lesion from the left kidney lower pole measuring 1.8 cm transverse diameter is photopenic compatible with a complex cyst. Mildly heterogeneous prostate activity with maximum SUV of up  to 4.8 in the left mid gland anteriorly. Incidental CT findings: Aortoiliac atherosclerotic vascular disease. Mild bilateral hydroureter extending to the urinary bladder. Thick-walled urinary bladder circumferentially, query cystitis. SKELETON: Moderately hypermetabolic activity along the capsular/synovial margins in the shoulders, elbows, knees, ankles, and some portions of the hands and feet corresponding to the considerable arthropathy at these joints shown on conventional radiography separate from the observed arthropathy. Separate from this arthropathy, I do not observe any high or intermediate suspicion lesions for myeloma. Incidental CT findings: Chondrocalcinosis in the joints. Possible diffuse idiopathic skeletal hyperostosis along the spine. Bridging spurring of the sacroiliac joints. EXTREMITIES: No significant abnormal hypermetabolic activity in this region. Incidental CT findings: Bilateral knee joint effusions. IMPRESSION: 1. No specific myelomatous lesions are identified. 2. Accentuated metabolic activity along capsular/synovial margins including the shoulders, elbows, wrists, knees, ankles, and portions of the hands and feet compatible with arthropathy. There is some associated chondrocalcinosis and spurring. 3. Other imaging findings of potential clinical significance: Chronic right maxillary sinusitis. Aortic Atherosclerosis (ICD10-I70.0). Coronary atherosclerosis. Mild cardiomegaly. Prior CABG. Diffusely thick-walled urinary bladder, query cystitis. Mild bilateral hydroureter, cause uncertain. Electronically Signed   By: Van Clines M.D.   On: 10/26/2019 08:08   IR Fluoro Guide Ndl Plmt / BX  Result Date: 10/24/2019 INDICATION: 64 year old male with a history of possible multiple myeloma EXAM: IR FLUORO GUIDE NEEDLE PLACEMENT /BIOPSY MEDICATIONS: None. ANESTHESIA/SEDATION: Moderate (conscious) sedation was employed during this procedure. A total of Versed 2.0 mg and Fentanyl 100 mcg was  administered intravenously. Moderate Sedation Time: 16 minutes. The patient's level of consciousness and vital signs were monitored continuously by radiology nursing throughout the procedure under my direct supervision. FLUOROSCOPY TIME:  Fluoroscopy Time: 2 minutes 24 seconds (57 mGy). COMPLICATIONS: Min none PROCEDURE: Informed written consent was obtained from the patient after a  thorough discussion of the procedural risks, benefits and alternatives. All questions were addressed. Maximal Sterile Barrier Technique was utilized including caps, mask, sterile gowns, sterile gloves, sterile drape, hand hygiene and skin antiseptic. A timeout was performed prior to the initiation of the procedure. Scout x-ray of the pelvis was performed for surgical planning purposes. The right posterior pelvis was prepped with Chlorhexidine in a sterile fashion, and a sterile drape was applied covering the operative field. A sterile gown and sterile gloves were used for the procedure. Local anesthesia was provided with 1% Lidocaine. Right posterior iliac bone was targeted for biopsy. The skin and subcutaneous tissues were infiltrated with 1% lidocaine without epinephrine. A small stab incision was made with an 11 blade scalpel, and an 11 gauge Murphy needle was advanced with fluoro guidance to the posterior cortex. Manual forced was used to advance the needle through the posterior cortex and the stylet was removed. A bone marrow aspirate was retrieved and passed to a cytotechnologist in the room. The Murphy needle was then advanced without the stylet for a core biopsy. The core biopsy was retrieved and also passed to a cytotechnologist. Manual pressure was used for hemostasis and a sterile dressing was placed. No complications were encountered no significant blood loss was encountered. Patient tolerated the procedure well and remained hemodynamically stable throughout. IMPRESSION: Status post image guided bone marrow biopsy of the  right posterior iliac bone. Signed, Dulcy Fanny. Dellia Nims, RPVI [facility] Electronically Signed   By: Corrie Mckusick D.O.   On: 10/24/2019 09:34    ASSESSMENT & PLAN:    65 yo with   1) Monoclonal paraproteinemia due to Newly diagnosed Multiple myeloma 09/27/19 M Protein at 3.4 2) Anemia ? Related to CKD vs ? Plasma cell dyscrasia 3) CKD ?etiology - HTN ? Cardiac issues ? Myeloma related.  PLAN: -Discussed pt labwork, 10/24/19; worsening anemia, WBC and PLT are nml, blood counts are steady -Discussed 10/25/2019 PET/CT ([id number]) which revealed "1. No specific myelomatous lesions are identified. 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  -Would recommend pt get repeat abdominal scans to monitor kidney lesion.  -Discussed CRAB criteria: no hypercalcemia, some renal dysfunction, progressive anemia, no bone lesions identified  -Pt has Active Multiple Myeloma based on plasma cell population and symptomatic disease -Advised pt that if we do not begin treatment for his Multiple Myeloma he could become more anemic and his kidney function can worsen -Advised pt that treatment would not be curative and would typically be a targeted therapy -Advised pt that steroids carry the risk of increased anxiety, increased BP and increased fluid  retention  -Advised pt that Daratumumab carries the risk of an allergic reaction, increases the risk of infection, and worsening blood counts -Recommended that the pt continue to eat well, drink at least 48-64 oz of water each day, and walk 20-30 minutes each day.  -Will set up chemo counseling in 2-3 days   -Plan to begin  Daratumumab/Dexamethasone in 7-10 days  -Will see back with C2 of treatment   FOLLOW UP: Plz schedule to start Daratumumab/Dexamethasone (weekly) in 7-10 days with labs with each treatment. Plz schedule 4 weeks of treatment MD visit with 2nd weekly dose for toxicity check Chemo-counseling for Daratumumab/dexamethasone in 2-3 days    The total time spent in the appt was 40 minutes and more than 50% was on counseling and direct patient cares.  All of the patient's questions were answered with apparent satisfaction. The patient knows to call the clinic with any problems, questions or concerns.    Sullivan Lone MD [place] AAHIVMS [facility] Hematology/Oncology Physician [facility]  (Office):       [phone] (Work cell):  [phone] (Fax):           [phone]  10/31/2019 10:59 AM  I, Yevette Edwards, am acting as a scribe for Dr. Sullivan Lone.   .I have reviewed the above documentation for accuracy and completeness, and I agree with the above. Brunetta Genera MD

## 2019-11-02 ENCOUNTER — Encounter (HOSPITAL_COMMUNITY): Payer: Self-pay | Admitting: Hematology

## 2019-11-02 LAB — SURGICAL PATHOLOGY

## 2019-11-03 ENCOUNTER — Other Ambulatory Visit: Payer: Self-pay

## 2019-11-03 ENCOUNTER — Inpatient Hospital Stay: Payer: No Typology Code available for payment source

## 2019-11-06 DIAGNOSIS — C9000 Multiple myeloma not having achieved remission: Secondary | ICD-10-CM | POA: Insufficient documentation

## 2019-11-06 DIAGNOSIS — Z7189 Other specified counseling: Secondary | ICD-10-CM | POA: Insufficient documentation

## 2019-11-06 MED ORDER — ACYCLOVIR 400 MG PO TABS: 200.0000 mg | ORAL_TABLET | Freq: Two times a day (BID) | ORAL | 11 refills | Status: DC

## 2019-11-06 MED ORDER — PROCHLORPERAZINE MALEATE 10 MG PO TABS: 10.0000 mg | ORAL_TABLET | Freq: Four times a day (QID) | ORAL | 1 refills | Status: DC | PRN

## 2019-11-06 MED ORDER — ONDANSETRON HCL 8 MG PO TABS: 8.0000 mg | ORAL_TABLET | Freq: Two times a day (BID) | ORAL | 1 refills | Status: DC | PRN

## 2019-11-06 NOTE — Progress Notes (Signed)
START ON PATHWAY REGIMEN - Multiple Myeloma and Other Plasma Cell Dyscrasias   DaraVRd (Split-dose Daratumumab IV + Bortezomib SUBQ + Lenalidomide PO + Dexamethasone IV/PO) q21 Days (Induction Schema):   Cycle 1: A cycle is 21 days:     Lenalidomide      Dexamethasone      Bortezomib      Daratumumab      Daratumumab    Cycles 2 and beyond: A cycle is every 21 days:     Lenalidomide      Dexamethasone      Bortezomib      Daratumumab   **Always confirm dose/schedule in your pharmacy ordering system**  DaraVRd (Daratumumab IV + Bortezomib SUBQ + Lenalidomide PO + Dexamethasone IV/PO) q21 Days (Consolidation Schema):   A cycle is every 21 days:     Lenalidomide      Dexamethasone      Bortezomib      Daratumumab   **Always confirm dose/schedule in your pharmacy ordering system**  Patient Characteristics: Multiple Myeloma, Newly Diagnosed, Transplant Eligible, High Risk Disease Classification: Multiple Myeloma R-ISS Staging: III Therapeutic Status: Newly Diagnosed Is Patient Eligible for Transplant<= Transplant Eligible Risk Status: High Risk Intent of Therapy: Non-Curative / Palliative Intent, Discussed with Patient

## 2019-11-10 ENCOUNTER — Telehealth: Payer: Self-pay | Admitting: *Deleted

## 2019-11-10 NOTE — Telephone Encounter (Signed)
Received vm call from pt stating that he had a mole to pop on his back & bled last hs.  His wife applied antibacterial oint & bandaid & it has seemed to stop bleeding.  He wanted to know if he needed to do anything else.  Returned call & informed to watch for infection, redness, swelling, yellow/green drainage, fever & cont what he is doing.  He was also informed to tell infusion room RN when he is here next week or call his PCP if worse over w/e.  Message to Dr Launa Flight RN

## 2019-11-13 ENCOUNTER — Telehealth: Payer: Self-pay

## 2019-11-13 ENCOUNTER — Ambulatory Visit: Payer: PRIVATE HEALTH INSURANCE | Attending: Internal Medicine

## 2019-11-13 DIAGNOSIS — Z23 Encounter for immunization: Secondary | ICD-10-CM

## 2019-11-13 NOTE — Progress Notes (Signed)
Pharmacist Chemotherapy Monitoring - Initial Assessment    Anticipated start date: 11/14/2019   Regimen:  . Are orders appropriate based on the patient's diagnosis, regimen, and cycle? Yes . Does the plan date match the patient's scheduled date? Yes . Is the sequencing of drugs appropriate? Yes . Are the premedications appropriate for the patient's regimen? Yes - double checking dexamethasone vs methylprednisolone for steroid . Prior Authorization for treatment is: Pending o If applicable, is the correct biosimilar selected based on the patient's insurance? not applicable  Organ Function and Labs: [name] Are dose adjustments needed based on the patient's renal function, hepatic function, or hematologic function? No . Are appropriate labs ordered prior to the start of patient's treatment? Yes . Other organ system assessment, if indicated: N/A . The following baseline labs, if indicated, have been ordered: daratumumab: blood type and screen  Dose Assessment: . Are the drug doses appropriate? Yes . Are the following correct: o Drug concentrations Yes o IV fluid compatible with drug Yes o Administration routes Yes o Timing of therapy Yes . If applicable, does the patient have documented access for treatment and/or plans for port-a-cath placement? not applicable . If applicable, have lifetime cumulative doses been properly documented and assessed? not applicable Lifetime Dose Tracking  No doses have been documented on this patient for the following tracked chemicals: Doxorubicin, Epirubicin, Idarubicin, Daunorubicin, Mitoxantrone, Bleomycin, Oxaliplatin, Carboplatin, Liposomal Doxorubicin  o   Toxicity Monitoring/Prevention: . The patient has the following take home antiemetics prescribed: Ondansetron and Prochlorperazine . The patient has the following take home medications prescribed: VZV prophylaxis . Medication allergies and previous infusion related reactions, if applicable, have been  reviewed and addressed. Yes . The patient's current medication list has been assessed for drug-drug interactions with their chemotherapy regimen. no significant drug-drug interactions were identified on review.  Order Review: . Are the treatment plan orders signed? No -will send message . Is the patient scheduled to see a provider prior to their treatment? No  I verify that I have reviewed each item in the above checklist and answered each question accordingly.  Norwood Levo [facility] 11/13/2019 10:43 AM

## 2019-11-13 NOTE — Telephone Encounter (Signed)
Received VM from chemo education nurse regarding pt. and a popped mole on his back. TCT patient regarding this, he verbalized there was no bleeding and it was okay. Patient denied any concerns or complaints stating "it's all okay, thank you for calling". Encouraged patient to call office back if he has any questions or concerns and he verbalized understanding.

## 2019-11-13 NOTE — Progress Notes (Signed)
   Covid-19 Vaccination Clinic  Name:  [name]    MRN: [id number] DOB: [birth year]  11/13/2019  [name] was observed post Covid-19 immunization for 15 minutes without incident. He was provided with Vaccine Information Sheet and instruction to access the V-Safe system.   [name] was instructed to call 911 with any severe reactions post vaccine: [name] Difficulty breathing  . Swelling of face and throat  . A fast heartbeat  . A bad rash all over body  . Dizziness and weakness   Immunizations Administered    Name Date Dose VIS Date Route   Pfizer COVID-19 Vaccine 11/13/2019  9:02 AM 0.3 mL 09/06/2018 Intramuscular   Manufacturer: [place]   Lot: [id number]   [place]: [id number]

## 2019-11-14 ENCOUNTER — Other Ambulatory Visit: Payer: Self-pay | Admitting: Hematology

## 2019-11-14 ENCOUNTER — Other Ambulatory Visit: Payer: Self-pay

## 2019-11-14 ENCOUNTER — Inpatient Hospital Stay: Payer: PRIVATE HEALTH INSURANCE

## 2019-11-14 ENCOUNTER — Inpatient Hospital Stay (HOSPITAL_BASED_OUTPATIENT_CLINIC_OR_DEPARTMENT_OTHER): Payer: PRIVATE HEALTH INSURANCE | Admitting: Medical

## 2019-11-14 ENCOUNTER — Inpatient Hospital Stay: Payer: PRIVATE HEALTH INSURANCE | Attending: Hematology

## 2019-11-14 VITALS — BP 156/85 | HR 75 | Temp 98.0°F | Resp 12 | Wt 178.4 lb

## 2019-11-14 DIAGNOSIS — Z8249 Family history of ischemic heart disease and other diseases of the circulatory system: Secondary | ICD-10-CM | POA: Insufficient documentation

## 2019-11-14 DIAGNOSIS — Z7982 Long term (current) use of aspirin: Secondary | ICD-10-CM | POA: Diagnosis not present

## 2019-11-14 DIAGNOSIS — Z7189 Other specified counseling: Secondary | ICD-10-CM

## 2019-11-14 DIAGNOSIS — I509 Heart failure, unspecified: Secondary | ICD-10-CM | POA: Diagnosis not present

## 2019-11-14 DIAGNOSIS — T8090XA Unspecified complication following infusion and therapeutic injection, initial encounter: Secondary | ICD-10-CM | POA: Diagnosis not present

## 2019-11-14 DIAGNOSIS — D649 Anemia, unspecified: Secondary | ICD-10-CM | POA: Insufficient documentation

## 2019-11-14 DIAGNOSIS — E785 Hyperlipidemia, unspecified: Secondary | ICD-10-CM | POA: Diagnosis not present

## 2019-11-14 DIAGNOSIS — E119 Type 2 diabetes mellitus without complications: Secondary | ICD-10-CM | POA: Diagnosis not present

## 2019-11-14 DIAGNOSIS — I110 Hypertensive heart disease with heart failure: Secondary | ICD-10-CM | POA: Insufficient documentation

## 2019-11-14 DIAGNOSIS — Z951 Presence of aortocoronary bypass graft: Secondary | ICD-10-CM | POA: Diagnosis not present

## 2019-11-14 DIAGNOSIS — C9000 Multiple myeloma not having achieved remission: Secondary | ICD-10-CM

## 2019-11-14 DIAGNOSIS — Z79899 Other long term (current) drug therapy: Secondary | ICD-10-CM | POA: Insufficient documentation

## 2019-11-14 DIAGNOSIS — Z5112 Encounter for antineoplastic immunotherapy: Secondary | ICD-10-CM | POA: Insufficient documentation

## 2019-11-14 DIAGNOSIS — Z7952 Long term (current) use of systemic steroids: Secondary | ICD-10-CM | POA: Diagnosis not present

## 2019-11-14 DIAGNOSIS — N189 Chronic kidney disease, unspecified: Secondary | ICD-10-CM | POA: Diagnosis not present

## 2019-11-14 DIAGNOSIS — Z833 Family history of diabetes mellitus: Secondary | ICD-10-CM | POA: Diagnosis not present

## 2019-11-14 DIAGNOSIS — Z791 Long term (current) use of non-steroidal anti-inflammatories (NSAID): Secondary | ICD-10-CM | POA: Insufficient documentation

## 2019-11-14 LAB — CBC WITH DIFFERENTIAL/PLATELET
Abs Immature Granulocytes: 0.01 10*3/uL (ref 0.00–0.07)
Basophils Absolute: 0 10*3/uL (ref 0.0–0.1)
Basophils Relative: 1 %
Eosinophils Absolute: 0.1 10*3/uL (ref 0.0–0.5)
Eosinophils Relative: 3 %
HCT: 30.1 % — ABNORMAL LOW (ref 39.0–52.0)
Hemoglobin: 9.6 g/dL — ABNORMAL LOW (ref 13.0–17.0)
Immature Granulocytes: 0 %
Lymphocytes Relative: 21 %
Lymphs Abs: 1.1 10*3/uL (ref 0.7–4.0)
MCH: 28.5 pg (ref 26.0–34.0)
MCHC: 31.9 g/dL (ref 30.0–36.0)
MCV: 89.3 fL (ref 80.0–100.0)
Monocytes Absolute: 0.6 10*3/uL (ref 0.1–1.0)
Monocytes Relative: 12 %
Neutro Abs: 3.2 10*3/uL (ref 1.7–7.7)
Neutrophils Relative %: 63 %
Platelets: 219 10*3/uL (ref 150–400)
RBC: 3.37 MIL/uL — ABNORMAL LOW (ref 4.22–5.81)
RDW: 16.8 % — ABNORMAL HIGH (ref 11.5–15.5)
WBC: 5.1 10*3/uL (ref 4.0–10.5)
nRBC: 0 % (ref 0.0–0.2)

## 2019-11-14 LAB — COMPREHENSIVE METABOLIC PANEL
ALT: 13 U/L (ref 0–44)
AST: 12 U/L — ABNORMAL LOW (ref 15–41)
Albumin: 3.4 g/dL — ABNORMAL LOW (ref 3.5–5.0)
Alkaline Phosphatase: 152 U/L — ABNORMAL HIGH (ref 38–126)
Anion gap: 7 (ref 5–15)
BUN: 21 mg/dL (ref 8–23)
CO2: 20 mmol/L — ABNORMAL LOW (ref 22–32)
Calcium: 8.9 mg/dL (ref 8.9–10.3)
Chloride: 111 mmol/L (ref 98–111)
Creatinine, Ser: 1.54 mg/dL — ABNORMAL HIGH (ref 0.61–1.24)
GFR calc Af Amer: 54 mL/min — ABNORMAL LOW (ref >60)
GFR calc non Af Amer: 47 mL/min — ABNORMAL LOW (ref >60)
Glucose, Bld: 203 mg/dL — ABNORMAL HIGH (ref 70–99)
Potassium: 3.6 mmol/L (ref 3.5–5.1)
Sodium: 138 mmol/L (ref 135–145)
Total Bilirubin: 0.6 mg/dL (ref 0.3–1.2)
Total Protein: 9.6 g/dL — ABNORMAL HIGH (ref 6.5–8.1)

## 2019-11-14 LAB — TYPE AND SCREEN
ABO/RH(D): O POS
Antibody Screen: NEGATIVE

## 2019-11-14 LAB — ABO/RH: ABO/RH(D): O POS

## 2019-11-14 LAB — PRETREATMENT RBC PHENOTYPE

## 2019-11-14 LAB — GLUCOSE, CAPILLARY: Glucose-Capillary: 118 mg/dL — ABNORMAL HIGH (ref 70–99)

## 2019-11-14 MED ORDER — FAMOTIDINE IN NACL 20-0.9 MG/50ML-% IV SOLN
INTRAVENOUS | Status: AC
  Filled 2019-11-14: qty 50

## 2019-11-14 MED ORDER — MEPERIDINE HCL 25 MG/ML IJ SOLN
25.0000 mg | INTRAMUSCULAR | Status: AC
  Administered 2019-11-14: 25 mg via INTRAVENOUS

## 2019-11-14 MED ORDER — DEXAMETHASONE 4 MG PO TABS
ORAL_TABLET | ORAL | 2 refills | Status: DC
Start: 2019-11-14 — End: 2020-04-09

## 2019-11-14 MED ORDER — METHYLPREDNISOLONE SODIUM SUCC 125 MG IJ SOLR
100.0000 mg | Freq: Once | INTRAMUSCULAR | Status: AC
  Administered 2019-11-14: 100 mg via INTRAVENOUS

## 2019-11-14 MED ORDER — SODIUM CHLORIDE 0.9 % IV SOLN
Freq: Once | INTRAVENOUS | Status: AC
  Filled 2019-11-14: qty 250

## 2019-11-14 MED ORDER — ACETAMINOPHEN 325 MG PO TABS
ORAL_TABLET | ORAL | Status: AC
  Filled 2019-11-14: qty 2

## 2019-11-14 MED ORDER — MONTELUKAST SODIUM 10 MG PO TABS
10.0000 mg | ORAL_TABLET | Freq: Once | ORAL | Status: AC
  Administered 2019-11-14: 10 mg via ORAL

## 2019-11-14 MED ORDER — DIPHENHYDRAMINE HCL 25 MG PO CAPS
50.0000 mg | ORAL_CAPSULE | Freq: Once | ORAL | Status: AC
  Administered 2019-11-14: 50 mg via ORAL

## 2019-11-14 MED ORDER — MEPERIDINE HCL 25 MG/ML IJ SOLN
INTRAMUSCULAR | Status: AC
  Filled 2019-11-14: qty 1

## 2019-11-14 MED ORDER — FAMOTIDINE IN NACL 20-0.9 MG/50ML-% IV SOLN
20.0000 mg | Freq: Once | INTRAVENOUS | Status: AC | PRN
  Administered 2019-11-14: 20 mg via INTRAVENOUS

## 2019-11-14 MED ORDER — ACETAMINOPHEN 325 MG PO TABS
650.0000 mg | ORAL_TABLET | Freq: Once | ORAL | Status: AC
  Administered 2019-11-14: 650 mg via ORAL

## 2019-11-14 MED ORDER — DIPHENHYDRAMINE HCL 25 MG PO CAPS
ORAL_CAPSULE | ORAL | Status: AC
  Filled 2019-11-14: qty 2

## 2019-11-14 MED ORDER — FAMOTIDINE IN NACL 20-0.9 MG/50ML-% IV SOLN
20.0000 mg | Freq: Once | INTRAVENOUS | Status: AC
  Administered 2019-11-14: 20 mg via INTRAVENOUS

## 2019-11-14 MED ORDER — METHYLPREDNISOLONE SODIUM SUCC 125 MG IJ SOLR
INTRAMUSCULAR | Status: AC
  Filled 2019-11-14: qty 2

## 2019-11-14 MED ORDER — MONTELUKAST SODIUM 10 MG PO TABS
ORAL_TABLET | ORAL | Status: AC
  Filled 2019-11-14: qty 1

## 2019-11-14 MED ORDER — SODIUM CHLORIDE 0.9 % IV SOLN
16.0000 mg/kg | Freq: Once | INTRAVENOUS | Status: AC
  Administered 2019-11-14: 1300 mg via INTRAVENOUS
  Filled 2019-11-14: qty 60

## 2019-11-14 NOTE — Progress Notes (Signed)
Dr. Irene Limbo made aware patient's glucose from labs this morning was 203. Per Dr. Irene Limbo recheck CBG at 11 am. Per Dr. Irene Limbo ok to treat with BP 183/77

## 2019-11-14 NOTE — Progress Notes (Signed)
CBG check at 1105= 118 Dr. Irene Limbo made aware and no further instructions received

## 2019-11-14 NOTE — Patient Instructions (Addendum)
[facility] Discharge Instructions for Patients Receiving Chemotherapy  Today you received the following chemotherapy agents Daratumumab  To help prevent nausea and vomiting after your treatment, we encourage you to take your nausea medication as directed.  If you develop nausea and vomiting that is not controlled by your nausea medication, call the clinic.   BELOW ARE SYMPTOMS THAT SHOULD BE REPORTED IMMEDIATELY:  *FEVER GREATER THAN 100.5 F  *CHILLS WITH OR WITHOUT FEVER  NAUSEA AND VOMITING THAT IS NOT CONTROLLED WITH YOUR NAUSEA MEDICATION  *UNUSUAL SHORTNESS OF BREATH  *UNUSUAL BRUISING OR BLEEDING  TENDERNESS IN MOUTH AND THROAT WITH OR WITHOUT PRESENCE OF ULCERS  *URINARY PROBLEMS  *BOWEL PROBLEMS  UNUSUAL RASH Items with * indicate a potential emergency and should be followed up as soon as possible.  Feel free to call the clinic should you have any questions or concerns. The clinic phone number is [phone].  Please show the [place] at check-in to the Emergency Department and triage nurse.  Daratumumab injection What is this medicine? DARATUMUMAB (dar a toom ue mab) is a monoclonal antibody. It is used to treat multiple myeloma. This medicine may be used for other purposes; ask your health care provider or pharmacist if you have questions. COMMON BRAND NAME(S): DARZALEX What should I tell my health care provider before I take this medicine? They need to know if you have any of these conditions:  infection (especially a virus infection such as chickenpox, herpes, or hepatitis B virus)  lung or breathing disease  an unusual or allergic reaction to daratumumab, other medicines, foods, dyes, or preservatives  pregnant or trying to get pregnant  breast-feeding How should I use this medicine? This medicine is for infusion into a vein. It is given by a health care professional in a hospital or clinic setting. Talk to your pediatrician  regarding the use of this medicine in children. Special care may be needed. Overdosage: If you think you have taken too much of this medicine contact a poison control center or emergency room at once. NOTE: This medicine is only for you. Do not share this medicine with others. What if I miss a dose? Keep appointments for follow-up doses as directed. It is important not to miss your dose. Call your doctor or health care professional if you are unable to keep an appointment. What may interact with this medicine? Interactions have not been studied. This list may not describe all possible interactions. Give your health care provider a list of all the medicines, herbs, non-prescription drugs, or dietary supplements you use. Also tell them if you smoke, drink alcohol, or use illegal drugs. Some items may interact with your medicine. What should I watch for while using this medicine? This drug may make you feel generally unwell. Report any side effects. Continue your course of treatment even though you feel ill unless your doctor tells you to stop. This medicine can cause serious allergic reactions. To reduce your risk you may need to take medicine before treatment with this medicine. Take your medicine as directed. This medicine can affect the results of blood tests to match your blood type. These changes can last for up to 6 months after the final dose. Your healthcare provider will do blood tests to match your blood type before you start treatment. Tell all of your healthcare providers that you are being treated with this medicine before receiving a blood transfusion. This medicine can affect the results of some tests used  to determine treatment response; extra tests may be needed to evaluate response. Do not become pregnant while taking this medicine or for 3 months after stopping it. Women should inform their doctor if they wish to become pregnant or think they might be pregnant. There is a potential for  serious side effects to an unborn child. Talk to your health care professional or pharmacist for more information. What side effects may I notice from receiving this medicine? Side effects that you should report to your doctor or health care professional as soon as possible:  allergic reactions like skin rash, itching or hives, swelling of the face, lips, or tongue  breathing problems  chills  cough  dizziness  feeling faint or lightheaded  headache  low blood counts - this medicine may decrease the number of white blood cells, red blood cells and platelets. You may be at increased risk for infections and bleeding.  nausea, vomiting  shortness of breath  signs of decreased platelets or bleeding - bruising, pinpoint red spots on the skin, black, tarry stools, blood in the urine  signs of decreased red blood cells - unusually weak or tired, feeling faint or lightheaded, falls  signs of infection - fever or chills, cough, sore throat, pain or difficulty passing urine  signs and symptoms of liver injury like dark yellow or brown urine; general ill feeling or flu-like symptoms; light-colored stools; loss of appetite; right upper belly pain; unusually weak or tired; yellowing of the eyes or skin Side effects that usually do not require medical attention (report to your doctor or health care professional if they continue or are bothersome):  back pain  constipation  diarrhea  joint pain  muscle cramps  pain, tingling, numbness in the hands or feet  swelling of the ankles, feet, hands  tiredness  trouble sleeping This list may not describe all possible side effects. Call your doctor for medical advice about side effects. You may report side effects to FDA at 1-800-FDA-1088. Where should I keep my medicine? This drug is given in a hospital or clinic and will not be stored at home. NOTE: This sheet is a summary. It may not cover all possible information. If you have  questions about this medicine, talk to your doctor, pharmacist, or health care provider.  2020 Elsevier/Gold Standard (2019-03-07 18:10:54)   Per Dr. Grier Mitts nurse Carlyon Prows, Dr. Irene Limbo sent a new prescription today to [facility] on [address] for dexamethasone 4 mg. Take 3 tablets on the day after Daratumumab.

## 2019-11-14 NOTE — Progress Notes (Signed)
1226- Patient c/o being very cold and not feeling well. Patient experiencing rigors. Daratumumab paused and Sandi Mealy, PA over to infusion to assess patient. Normal saline started at 999 ml/hr and Pepcid given. Demerol 25 mg given at 1231. Vital signs taken (see flow sheet). Per Sandi Mealy, PA ok to restart Daratumumab at 1245 at prior rate of 50 ml/hr.

## 2019-11-14 NOTE — Progress Notes (Signed)
BP 183/77, Dr Irene Limbo ok to proceed with tx.  RN will recheck BP later.

## 2019-11-14 NOTE — Progress Notes (Signed)
Pt d/c home with last pt of the day with @ 200 ml left in bag of Daratumumab per OK from Dr Irene Limbo.

## 2019-11-15 NOTE — Progress Notes (Signed)
    DATE:  11/14/2019                                          X  CHEMO/IMMUNOTHERAPY REACTION            MD:  Dr. Sullivan Lone   AGENT/BLOOD [place]:              Daratumumab   AGENT/BLOOD PRODUCT RECEIVING IMMEDIATELY PRIOR TO REACTION:          Daratumumab   VS: BP:     152/80   P:       75       SPO2:       100% on room air T: 97.8                BP:     167/91   P:       76       SPO2:       100% on room air BP:     163/105   P:       76       SPO2:       100% on room air    REACTION(S):           rigors and elevated blood pressure   PREMEDS:      Tylenol, Benadryl, Pepcid, solumedrol, and Singulair   INTERVENTION: Daratumumab was paused and the patient was given Demerol 25 mg IV   Review of Systems  Review of Systems  Constitutional: Negative for chills, diaphoresis and fever.  HENT: Negative for trouble swallowing and voice change.   Respiratory: Negative for cough, chest tightness, shortness of breath and wheezing.   Cardiovascular: Negative for chest pain and palpitations.  Gastrointestinal: Negative for abdominal pain, constipation, diarrhea, nausea and vomiting.  Musculoskeletal: Negative for back pain and myalgias.  Neurological: Positive for tremors. Negative for dizziness, light-headedness and headaches.     Physical Exam  Physical Exam Constitutional:      General: He is not in acute distress.    Appearance: He is not diaphoretic.  HENT:     Head: Normocephalic and atraumatic.  Cardiovascular:     Rate and Rhythm: Normal rate and regular rhythm.     Heart sounds: Normal heart sounds. No murmur. No friction rub. No gallop.   Pulmonary:     Effort: Pulmonary effort is normal. No respiratory distress.     Breath sounds: Normal breath sounds. No wheezing or rales.  Musculoskeletal:     Comments: Rigors noted. Resolving after dosing with Demerol  Skin:    General: Skin is warm and dry.     Findings: No erythema or rash.  Neurological:   Mental Status: He is alert.     OUTCOME:                Rigors resolved after dosing withy Demerol. Daratumumab was restarted and completed. Dr. Irene Limbo plans to add Demerol 25 mg IV as a premed and plans to add oral steroids for several days prior to future infusions.  This case was discussed with Dr. Irene Limbo. He expresses agreement with my management of this patient.   Sandi Mealy, MHS, PA-C

## 2019-11-15 NOTE — Progress Notes (Signed)
Infusion related reaction with first daratumumab infusion. Per Dr. Irene Limbo, add meperidine 25mg  and increase famotidine dose to 40mg  for future premeds. C1D8 will also be run as a first time in 1L normal saline. Schedule updated to reflect this.   Demetrius Charity, PharmD, BCPS, [facility] Pharmacist Pharmacy Phone: [phone] 11/15/2019

## 2019-11-15 NOTE — Progress Notes (Signed)
Pharmacist Chemotherapy Monitoring - Follow Up Assessment    I verify that I have reviewed each item in the below checklist:  . Regimen for the patient is scheduled for the appropriate day and plan matches scheduled date. [name] Appropriate non-routine labs are ordered dependent on drug ordered. . If applicable, additional medications reviewed and ordered per protocol based on lifetime cumulative doses and/or treatment regimen.   Plan for follow-up and/or issues identified: Yes . I-vent associated with next due treatment: Yes . MD and/or nursing notified: No  Drew Herman K 11/15/2019 9:48 AM

## 2019-11-20 ENCOUNTER — Other Ambulatory Visit: Payer: Self-pay

## 2019-11-20 ENCOUNTER — Other Ambulatory Visit (INDEPENDENT_AMBULATORY_CARE_PROVIDER_SITE_OTHER): Payer: PRIVATE HEALTH INSURANCE

## 2019-11-20 ENCOUNTER — Other Ambulatory Visit: Payer: Self-pay | Admitting: *Deleted

## 2019-11-20 DIAGNOSIS — Z794 Long term (current) use of insulin: Secondary | ICD-10-CM

## 2019-11-20 DIAGNOSIS — E1165 Type 2 diabetes mellitus with hyperglycemia: Secondary | ICD-10-CM | POA: Diagnosis not present

## 2019-11-20 DIAGNOSIS — C9000 Multiple myeloma not having achieved remission: Secondary | ICD-10-CM

## 2019-11-20 LAB — BASIC METABOLIC PANEL
BUN: 41 mg/dL — ABNORMAL HIGH (ref 6–23)
CO2: 23 mEq/L (ref 19–32)
Calcium: 8.7 mg/dL (ref 8.4–10.5)
Chloride: 107 mEq/L (ref 96–112)
Creatinine, Ser: 1.4 mg/dL (ref 0.40–1.50)
GFR: 61.66 mL/min (ref >60.00)
Glucose, Bld: 182 mg/dL — ABNORMAL HIGH (ref 70–99)
Potassium: 3.8 mEq/L (ref 3.5–5.1)
Sodium: 135 mEq/L (ref 135–145)

## 2019-11-21 ENCOUNTER — Inpatient Hospital Stay: Payer: PRIVATE HEALTH INSURANCE

## 2019-11-21 ENCOUNTER — Inpatient Hospital Stay (HOSPITAL_BASED_OUTPATIENT_CLINIC_OR_DEPARTMENT_OTHER): Payer: PRIVATE HEALTH INSURANCE | Admitting: Hematology

## 2019-11-21 ENCOUNTER — Other Ambulatory Visit: Payer: Self-pay | Admitting: Hematology

## 2019-11-21 ENCOUNTER — Other Ambulatory Visit: Payer: Self-pay

## 2019-11-21 VITALS — BP 158/87 | HR 65 | Temp 98.2°F | Resp 18

## 2019-11-21 DIAGNOSIS — D649 Anemia, unspecified: Secondary | ICD-10-CM

## 2019-11-21 DIAGNOSIS — Z7189 Other specified counseling: Secondary | ICD-10-CM

## 2019-11-21 DIAGNOSIS — C9000 Multiple myeloma not having achieved remission: Secondary | ICD-10-CM

## 2019-11-21 DIAGNOSIS — Z5112 Encounter for antineoplastic immunotherapy: Secondary | ICD-10-CM | POA: Diagnosis not present

## 2019-11-21 LAB — CMP (CANCER CENTER ONLY)
ALT: 14 U/L (ref 0–44)
AST: 9 U/L — ABNORMAL LOW (ref 15–41)
Albumin: 2.9 g/dL — ABNORMAL LOW (ref 3.5–5.0)
Alkaline Phosphatase: 145 U/L — ABNORMAL HIGH (ref 38–126)
Anion gap: 8 (ref 5–15)
BUN: 35 mg/dL — ABNORMAL HIGH (ref 8–23)
CO2: 19 mmol/L — ABNORMAL LOW (ref 22–32)
Calcium: 8.1 mg/dL — ABNORMAL LOW (ref 8.9–10.3)
Chloride: 109 mmol/L (ref 98–111)
Creatinine: 1.52 mg/dL — ABNORMAL HIGH (ref 0.61–1.24)
GFR, Est AFR Am: 55 mL/min — ABNORMAL LOW (ref >60)
GFR, Estimated: 48 mL/min — ABNORMAL LOW (ref >60)
Glucose, Bld: 190 mg/dL — ABNORMAL HIGH (ref 70–99)
Potassium: 3.6 mmol/L (ref 3.5–5.1)
Sodium: 136 mmol/L (ref 135–145)
Total Bilirubin: 0.5 mg/dL (ref 0.3–1.2)
Total Protein: 8 g/dL (ref 6.5–8.1)

## 2019-11-21 LAB — CBC WITH DIFFERENTIAL (CANCER CENTER ONLY)
Abs Immature Granulocytes: 0.1 10*3/uL — ABNORMAL HIGH (ref 0.00–0.07)
Basophils Absolute: 0 10*3/uL (ref 0.0–0.1)
Basophils Relative: 0 %
Eosinophils Absolute: 0 10*3/uL (ref 0.0–0.5)
Eosinophils Relative: 0 %
HCT: 32.6 % — ABNORMAL LOW (ref 39.0–52.0)
Hemoglobin: 10.7 g/dL — ABNORMAL LOW (ref 13.0–17.0)
Immature Granulocytes: 1 %
Lymphocytes Relative: 4 %
Lymphs Abs: 0.6 10*3/uL — ABNORMAL LOW (ref 0.7–4.0)
MCH: 28.5 pg (ref 26.0–34.0)
MCHC: 32.8 g/dL (ref 30.0–36.0)
MCV: 86.7 fL (ref 80.0–100.0)
Monocytes Absolute: 1.4 10*3/uL — ABNORMAL HIGH (ref 0.1–1.0)
Monocytes Relative: 10 %
Neutro Abs: 12 10*3/uL — ABNORMAL HIGH (ref 1.7–7.7)
Neutrophils Relative %: 85 %
Platelet Count: 268 10*3/uL (ref 150–400)
RBC: 3.76 MIL/uL — ABNORMAL LOW (ref 4.22–5.81)
RDW: 16.4 % — ABNORMAL HIGH (ref 11.5–15.5)
WBC Count: 14 10*3/uL — ABNORMAL HIGH (ref 4.0–10.5)
nRBC: 0 % (ref 0.0–0.2)

## 2019-11-21 LAB — FRUCTOSAMINE: Fructosamine: 311 umol/L — ABNORMAL HIGH (ref 0–285)

## 2019-11-21 MED ORDER — MEPERIDINE HCL 25 MG/ML IJ SOLN
INTRAMUSCULAR | Status: AC
  Filled 2019-11-21: qty 1

## 2019-11-21 MED ORDER — MONTELUKAST SODIUM 10 MG PO TABS
ORAL_TABLET | ORAL | Status: AC
  Filled 2019-11-21: qty 1

## 2019-11-21 MED ORDER — METHYLPREDNISOLONE SODIUM SUCC 125 MG IJ SOLR
100.0000 mg | Freq: Once | INTRAMUSCULAR | Status: AC
  Administered 2019-11-21: 100 mg via INTRAVENOUS

## 2019-11-21 MED ORDER — MONTELUKAST SODIUM 10 MG PO TABS
10.0000 mg | ORAL_TABLET | Freq: Once | ORAL | Status: AC
  Administered 2019-11-21: 10 mg via ORAL

## 2019-11-21 MED ORDER — ACETAMINOPHEN 325 MG PO TABS
ORAL_TABLET | ORAL | Status: AC
  Filled 2019-11-21: qty 2

## 2019-11-21 MED ORDER — SODIUM CHLORIDE 0.9 % IV SOLN
Freq: Once | INTRAVENOUS | Status: AC
  Filled 2019-11-21: qty 250

## 2019-11-21 MED ORDER — MEPERIDINE HCL 25 MG/ML IJ SOLN
25.0000 mg | Freq: Once | INTRAMUSCULAR | Status: AC
  Administered 2019-11-21: 25 mg via INTRAVENOUS

## 2019-11-21 MED ORDER — DIPHENHYDRAMINE HCL 25 MG PO CAPS
ORAL_CAPSULE | ORAL | Status: AC
  Filled 2019-11-21: qty 2

## 2019-11-21 MED ORDER — SODIUM CHLORIDE 0.9 % IV SOLN
40.0000 mg | Freq: Once | INTRAVENOUS | Status: AC
  Administered 2019-11-21: 40 mg via INTRAVENOUS
  Filled 2019-11-21: qty 4

## 2019-11-21 MED ORDER — METHYLPREDNISOLONE SODIUM SUCC 125 MG IJ SOLR
INTRAMUSCULAR | Status: AC
  Filled 2019-11-21: qty 2

## 2019-11-21 MED ORDER — ACETAMINOPHEN 325 MG PO TABS
650.0000 mg | ORAL_TABLET | Freq: Once | ORAL | Status: AC
  Administered 2019-11-21: 650 mg via ORAL

## 2019-11-21 MED ORDER — DIPHENHYDRAMINE HCL 25 MG PO CAPS
50.0000 mg | ORAL_CAPSULE | Freq: Once | ORAL | Status: AC
  Administered 2019-11-21: 50 mg via ORAL

## 2019-11-21 MED ORDER — SODIUM CHLORIDE 0.9 % IV SOLN
16.0000 mg/kg | Freq: Once | INTRAVENOUS | Status: AC
  Administered 2019-11-21: 1300 mg via INTRAVENOUS
  Filled 2019-11-21: qty 60

## 2019-11-21 NOTE — Progress Notes (Signed)
HEMATOLOGY/ONCOLOGY CONSULTATION NOTE  Date of Service: 11/21/2019  Patient Care Team: [name], Joseph Halsted, MD as PCP - General (Internal Medicine) [name], Peter M, MD as PCP - Cardiology (Cardiology)  CHIEF COMPLAINTS/PURPOSE OF CONSULTATION:  Recently diagnosed myeloma  HISTORY OF PRESENTING ILLNESS:  [name] is a wonderful 64 y.o. male who has been referred to [place] by [name] for evaluation and management of elevated protein. Pt is accompanied today by his wife, [name]. The pt reports that he is doing well overall.   The pt reports he was first told that he had Diabetes two years ago. He has not previously needed to be on medications for his diabetes but has recently been started on Januvia by [name], his Endocrinologist. His blood glucose was 149 this morning. He sees [name] and Joseph. [place] for Cardiology. Pt has CAD and had to have open heart surgery 20 years ago. He also has HTN that he feels has been stable. His Cardiology team is currently managing his heart medications and recently told pt to discontinue taking Furosemide due kidney function on last labs. Joseph. [place] and [name] have been essentially functioning as his primary care, as he does not have a PCP at this time. He has an upcoming appointment with [name] in the beginning of May and [name] next Tuesday. Pt is currently using Ibuprofen a couple of times per week for his knee pain.  Pt has felt the same in the last 3-6 months and denies any new bone pain of any kinds. He is eating, functioning, and moving about as normal. He has lost about 13 lbs over the last few months. Pt does not smoke and does not drink much alcohol.   Most recent lab results (09/04/2019) of CBC, BMP and Hepatic function panel is as follows: all values are WNL except for RBC at 3.64, Hgb at 9.8, HCT at 29.8, Glucose at 129, BUN at 64, Creatinine at 2.62, GFR Est Af Am at 29, CO2 at 15,  Total Protein at 10.0, Albumin at 3.6, ALP at 163, ALT at 45.   On review of systems, pt reports unexpected weight loss, left knee pain and denies new back pain, new shoulder pain, new hip pain, chest pain, SOB, leg swelling, testicular pain/swelling and any other symptoms.   On PMHx the pt reports Left Knee Arthritis, CAD, CHF, Type II Diabetes, HTN, HLD, Cardiac Catheterization, Coronary Artery Bypass Graft x4 (2002). On Social Hx the pt reports that he is a non-smoker and does not drink much alcohol.  INTERVAL HISTORY:   [name] is a wonderful 64 y.o. male is here for evaluation and management of newly diagnosed myeloma. The patient's last visit with [place] was on 10/31/2019. The pt reports that he is doing well overall.  The pt reports that he has had no issues with treatment today. He has not had any dull pain in his legs since beginning treatment. This has improved his overall mobility. He denies any leg swelling, N/V/D, or SOB.   Lab results today (11/21/19) of CBC w/diff and CMP is as follows: all values are WNL except for WBC at 14.0K, RBC at 3.76, Hgb at 10.7, HCT at 32.6, RDW at 16.4, Neutro Abs at 12.0K, Lymphs Abs at 0.6K, Mono Abs at 1.4K, Abs Immature Granulocytes at 0.10K, CO2 at 19, Glucose at 190, BUN at 35, Creatinine at 1.52, Calcium at 8.1, Albumin at 2.9, AST at  9, ALP at 145, GFR Est Af Am at 55.  On review of systems, pt denies leg swelling, leg aches, N/V/D, SOB, headaches, abdominal pain and any other symptoms.   MEDICAL HISTORY:  Past Medical History:  Diagnosis Date  . Arthritis    "left knee" (11/24/2017)  . CAD (coronary artery disease)    CABG 2002  . CHF (congestive heart failure) ([place])   . Chronic renal insufficiency   . Diabetes mellitus without complication ([place])   . Dilated cardiomyopathy ([place])    echo in 2009 showed improvement with a normal EF  . HTN (hypertension)   . Hyperlipemia   . Noncompliance   . PAD (peripheral artery disease)  ([place])     SURGICAL HISTORY: Past Surgical History:  Procedure Laterality Date  . CARDIAC CATHETERIZATION  2002, 2006  . CORONARY ARTERY BYPASS GRAFT  2002   x4 Joseph. VAN TRIGT  . IR FLUORO GUIDED NEEDLE PLC ASPIRATION/INJECTION LOC  10/24/2019    SOCIAL HISTORY: Social History   Socioeconomic History  . Marital status: Married    Spouse name: Not on file  . Number of children: 1  . Years of education: Not on file  . Highest education level: Not on file  Occupational History  . Occupation: Furniture conservator/restorer  Tobacco Use  . Smoking status: Never Smoker  . Smokeless tobacco: Never Used  Substance and Sexual Activity  . Alcohol use: No  . Drug use: No  . Sexual activity: Yes  Other Topics Concern  . Not on file  Social History Narrative  . Not on file   Social Determinants of Health   Financial Resource Strain:   . Difficulty of Paying Living Expenses:   Food Insecurity:   . Worried About Charity fundraiser in the Last Year:   . Arboriculturist in the Last Year:   Transportation Needs:   . Film/video editor (Medical):   [name] Lack of Transportation (Non-Medical):   Physical Activity:   . Days of Exercise per Week:   . Minutes of Exercise per Session:   Stress:   . Feeling of Stress :   Social Connections:   . Frequency of Communication with Friends and Family:   . Frequency of Social Gatherings with Friends and Family:   . Attends Religious Services:   . Active Member of Clubs or Organizations:   . Attends Archivist Meetings:   [name] Marital Status:   Intimate Partner Violence:   . Fear of Current or Ex-Partner:   . Emotionally Abused:   [name] Physically Abused:   . Sexually Abused:     FAMILY HISTORY: Family History  Problem Relation Age of Onset  . Hypertension Father   . Diabetes Mother        DIABETIC COMA    ALLERGIES:  has No Known Allergies.  MEDICATIONS:  Current Outpatient Medications  Medication Sig Dispense Refill  . Accu-Chek FastClix  Lancets MISC Use Accu Chek fastclix lancets to check blood sugar fasting or 2 hours after a meal every other day. 100 each 2  . acyclovir (ZOVIRAX) 400 MG tablet Take 0.5 tablets (200 mg total) by mouth 2 (two) times daily. 60 tablet 11  . amLODipine (NORVASC) 5 MG tablet Take 1 tablet (5 mg total) by mouth daily. 90 tablet 3  . aspirin 325 MG EC tablet Take 325 mg by mouth daily.     [name] atorvastatin (LIPITOR) 80 MG tablet Take 1 tablet (80 mg total) by  mouth daily. 90 tablet 3  . Blood Glucose Monitoring Suppl (ACCU-CHEK GUIDE ME) w/Device KIT 1 each by Does not apply route.    . carvedilol (COREG) 25 MG tablet Take 1 tablet (25 mg total) by mouth 2 (two) times daily with a meal. 180 tablet 3  . dexamethasone (DECADRON) 4 MG tablet 3 tabs ('12mg'$ ) orally with breakfast the day after each Daratumumab treatment. 60 tablet 2  . glucose blood (ACCU-CHEK GUIDE) test strip Use Accu Chek test strips as instructed to check blood sugar fasting or two hours after meals, every other day. 100 each 2  . hydrALAZINE (APRESOLINE) 50 MG tablet TAKE 1 TABLET BY MOUTH THREE TIMES DAILY 270 tablet 2  . ibuprofen (ADVIL,MOTRIN) 200 MG tablet Take 200 mg by mouth every 6 (six) hours as needed for moderate pain (for knee).    . isosorbide mononitrate (IMDUR) 30 MG 24 hr tablet Take 1 tablet (30 mg total) by mouth daily. 90 tablet 3  . losartan (COZAAR) 100 MG tablet Take 1 tablet by mouth once daily 90 tablet 1  . ondansetron (ZOFRAN) 8 MG tablet Take 1 tablet (8 mg total) by mouth 2 (two) times daily as needed (Nausea or vomiting). 30 tablet 1  . prochlorperazine (COMPAZINE) 10 MG tablet Take 1 tablet (10 mg total) by mouth every 6 (six) hours as needed (Nausea or vomiting). 30 tablet 1   No current facility-administered medications for this visit.    REVIEW OF SYSTEMS:   A 10+ POINT REVIEW OF SYSTEMS WAS OBTAINED including neurology, dermatology, psychiatry, cardiac, respiratory, lymph, extremities, GI, GU,  Musculoskeletal, constitutional, breasts, reproductive, HEENT.  All pertinent positives are noted in the HPI.  All others are negative.   PHYSICAL EXAMINATION: ECOG PERFORMANCE STATUS: 0 - Asymptomatic  VS reviewed - stable  Exam was given in a chair   GENERAL:alert, in no acute distress and comfortable SKIN: no acute rashes, no significant lesions EYES: conjunctiva are pink and non-injected, sclera anicteric OROPHARYNX: MMM, no exudates, no oropharyngeal erythema or ulceration NECK: supple, no JVD LYMPH:  no palpable lymphadenopathy in the cervical, axillary or inguinal regions LUNGS: clear to auscultation b/l with normal respiratory effort HEART: regular rate & rhythm ABDOMEN:  normoactive bowel sounds , non tender, not distended. No palpable hepatosplenomegaly.  Extremity: no pedal edema PSYCH: alert & oriented x 3 with fluent speech NEURO: no focal motor/sensory deficits  LABORATORY DATA:  I have reviewed the data as listed  . CBC Latest Ref Rng & Units 11/21/2019 11/14/2019 10/24/2019  WBC 4.0 - 10.5 K/uL 14.0(H) 5.1 5.1  Hemoglobin 13.0 - 17.0 g/dL 10.7(L) 9.6(L) 9.3(L)  Hematocrit 39.0 - 52.0 % 32.6(L) 30.1(L) 30.7(L)  Platelets 150 - 400 K/uL 268 219 220    . CMP Latest Ref Rng & Units 11/21/2019 11/20/2019 11/14/2019  Glucose 70 - 99 mg/dL 190(H) 182(H) 203(H)  BUN 8 - 23 mg/dL 35(H) 41(H) 21  Creatinine 0.61 - 1.24 mg/dL 1.52(H) 1.40 1.54(H)  Sodium 135 - 145 mmol/L 136 135 138  Potassium 3.5 - 5.1 mmol/L 3.6 3.8 3.6  Chloride 98 - 111 mmol/L 109 107 111  CO2 22 - 32 mmol/L 19(L) 23 20(L)  Calcium 8.9 - 10.3 mg/dL 8.1(L) 8.7 8.9  Total Protein 6.5 - 8.1 g/dL 8.0 - 9.6(H)  Total Bilirubin 0.3 - 1.2 mg/dL 0.5 - 0.6  Alkaline Phos 38 - 126 U/L 145(H) - 152(H)  AST 15 - 41 U/L 9(L) - 12(L)  ALT 0 - 44 U/L 14 -  13   10/24/2019 FISH Panel:    10/24/2019 [facility] Surgical Pathology:    RADIOGRAPHIC STUDIES: I have personally reviewed the radiological images as listed  and agreed with the findings in the report. NM PET Image Initial (PI) Whole Body  Result Date: 10/26/2019 CLINICAL DATA:  Initial treatment strategy for multiple myeloma. EXAM: NUCLEAR MEDICINE PET WHOLE BODY TECHNIQUE: 0.6 mCi F-18 FDG was injected intravenously. Full-ring PET imaging was performed from the skull vertex through the feet after the radiotracer. CT data was obtained and used for attenuation correction and anatomic localization. Fasting blood glucose: 91 mg/dl COMPARISON:  Bone survey of 09/29/2019 FINDINGS: Mediastinal blood pool activity: SUV max 2.3 HEAD/NECK: INSERT skip region Incidental CT findings: Mild chronic right maxillary sinusitis. CHEST: No significant abnormal hypermetabolic activity in this region. Incidental CT findings: Mild cardiomegaly. Coronary, aortic arch, and branch vessel atherosclerotic vascular disease. Prior CABG. Dependent subsegmental atelectasis in the lower lobes. ABDOMEN/PELVIS: Hyperdense exophytic lesion from the left kidney lower pole measuring 1.8 cm transverse diameter is photopenic compatible with a complex cyst. Mildly heterogeneous prostate activity with maximum SUV of up to 4.8 in the left mid gland anteriorly. Incidental CT findings: Aortoiliac atherosclerotic vascular disease. Mild bilateral hydroureter extending to the urinary bladder. Thick-walled urinary bladder circumferentially, query cystitis. SKELETON: Moderately hypermetabolic activity along the capsular/synovial margins in the shoulders, elbows, knees, ankles, and some portions of the hands and feet corresponding to the considerable arthropathy at these joints shown on conventional radiography separate from the observed arthropathy. Separate from this arthropathy, I do not observe any high or intermediate suspicion lesions for myeloma. Incidental CT findings: Chondrocalcinosis in the joints. Possible diffuse idiopathic skeletal hyperostosis along the spine. Bridging spurring of the sacroiliac  joints. EXTREMITIES: No significant abnormal hypermetabolic activity in this region. Incidental CT findings: Bilateral knee joint effusions. IMPRESSION: 1. No specific myelomatous lesions are identified. 2. Accentuated metabolic activity along capsular/synovial margins including the shoulders, elbows, wrists, knees, ankles, and portions of the hands and feet compatible with arthropathy. There is some associated chondrocalcinosis and spurring. 3. Other imaging findings of potential clinical significance: Chronic right maxillary sinusitis. Aortic Atherosclerosis (ICD10-I70.0). Coronary atherosclerosis. Mild cardiomegaly. Prior CABG. Diffusely thick-walled urinary bladder, query cystitis. Mild bilateral hydroureter, cause uncertain. Electronically Signed   By: Van Clines Welch.D.   On: 10/26/2019 08:08   IR Fluoro Guide Ndl Plmt / BX  Result Date: 10/24/2019 INDICATION: 64 year old male with a history of possible multiple myeloma EXAM: IR FLUORO GUIDE NEEDLE PLACEMENT /BIOPSY MEDICATIONS: None. ANESTHESIA/SEDATION: Moderate (conscious) sedation was employed during this procedure. A total of Versed 2.0 mg and Fentanyl 100 mcg was administered intravenously. Moderate Sedation Time: 16 minutes. The patient's level of consciousness and vital signs were monitored continuously by radiology nursing throughout the procedure under my direct supervision. FLUOROSCOPY TIME:  Fluoroscopy Time: 2 minutes 24 seconds (57 mGy). COMPLICATIONS: Min none PROCEDURE: Informed written consent was obtained from the patient after a thorough discussion of the procedural risks, benefits and alternatives. All questions were addressed. Maximal Sterile Barrier Technique was utilized including caps, mask, sterile gowns, sterile gloves, sterile drape, hand hygiene and skin antiseptic. A timeout was performed prior to the initiation of the procedure. Scout x-ray of the pelvis was performed for surgical planning purposes. The right posterior  pelvis was prepped with Chlorhexidine in a sterile fashion, and a sterile drape was applied covering the operative field. A sterile gown and sterile gloves were used for the procedure. Local anesthesia was provided with 1% Lidocaine.  Right posterior iliac bone was targeted for biopsy. The skin and subcutaneous tissues were infiltrated with 1% lidocaine without epinephrine. A small stab incision was made with an 11 blade scalpel, and an 11 gauge Murphy needle was advanced with fluoro guidance to the posterior cortex. Manual forced was used to advance the needle through the posterior cortex and the stylet was removed. A bone marrow aspirate was retrieved and passed to a cytotechnologist in the room. The Murphy needle was then advanced without the stylet for a core biopsy. The core biopsy was retrieved and also passed to a cytotechnologist. Manual pressure was used for hemostasis and a sterile dressing was placed. No complications were encountered no significant blood loss was encountered. Patient tolerated the procedure well and remained hemodynamically stable throughout. IMPRESSION: Status post image guided bone marrow biopsy of the right posterior iliac bone. Signed, Dulcy Fanny. Dellia Nims, RPVI [facility] Radiology Electronically Signed   By: Corrie Mckusick D.O.   On: 10/24/2019 09:34    ASSESSMENT & PLAN:    64 yo with   1)  Newly diagnosed Multiple myeloma -09/27/19 Welch Protein at 3.4 -10/25/2019 PET/CT ([id number]) revealed "1. No specific myelomatous lesions are identified. 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  2) Anemia ? Related to CKD vs ? Plasma cell dyscrasia 3) CKD ?etiology - HTN ? Cardiac issues ? Myeloma related.  PLAN: -Discussed pt labwork today, 11/21/19; Hgb as improved, other blood counts are stable, blood chemistries are steady  -The pt has no prohibitive toxicities from continuing C1D8 Daratumumab/Dexamethasone at this time. -Advised pt to take Dexamethasone with breakfast the day after each treatment -Will see back in 3 weeks with labs   FOLLOW UP: F/u for next 2 weekly treatments with Daratumumab as scheduled.   The total time spent in the appt was 30 minutes and more than 50% was on counseling and direct patient cares, ordering and reviewing treatment plans and toxicity check  All of the patient's questions were answered with apparent satisfaction. The patient knows to call the clinic with any problems, questions or concerns.    Sullivan Lone MD [place] AAHIVMS [facility] Hematology/Oncology Physician [facility]  (Office):       [phone] (Work cell):  [phone] (Fax):           [phone]  11/21/2019 1:39 PM  I, Yevette Edwards, am acting as a scribe for Joseph. Sullivan Lone.   .I have reviewed the above documentation for accuracy and completeness, and I agree with the above. Brunetta Genera MD

## 2019-11-21 NOTE — Patient Instructions (Signed)
[facility] Discharge Instructions for Patients Receiving Chemotherapy  Today you received the following chemotherapy agents: Darzalex  To help prevent nausea and vomiting after your treatment, we encourage you to take your nausea medication as directed.    If you develop nausea and vomiting that is not controlled by your nausea medication, call the clinic.   BELOW ARE SYMPTOMS THAT SHOULD BE REPORTED IMMEDIATELY:  *FEVER GREATER THAN 100.5 F  *CHILLS WITH OR WITHOUT FEVER  NAUSEA AND VOMITING THAT IS NOT CONTROLLED WITH YOUR NAUSEA MEDICATION  *UNUSUAL SHORTNESS OF BREATH  *UNUSUAL BRUISING OR BLEEDING  TENDERNESS IN MOUTH AND THROAT WITH OR WITHOUT PRESENCE OF ULCERS  *URINARY PROBLEMS  *BOWEL PROBLEMS  UNUSUAL RASH Items with * indicate a potential emergency and should be followed up as soon as possible.  Feel free to call the clinic should you have any questions or concerns. The clinic phone number is [phone].  Please show the CHEMO ALERT CARD at check-in to the Emergency Department and triage nurse.   

## 2019-11-22 NOTE — Progress Notes (Signed)
Patient ID: [name], male   DOB: 03/27/1956, 64 y.o.   MRN: [id number]           Reason for Appointment: Follow-up for Type 2 Diabetes  Referring : Truitt Merle   History of Present Illness:          Date of diagnosis of type 2 diabetes mellitus:   2018      Background history:  He has never been told to have diabetes but review of his previous labs indicate that he had high blood sugar 179 in 2014 His highest sugar has been 234 in 10/2016 when his A1c was 8.8, not clear if he was symptomatic at that time He was recommended referral for diabetes at that time but he did not make an appointment  Recent history:   A1c is 7.8 done in 2/20, lowest level 7.1, highest 8.8 Fructosamine is 311  Non-insulin hypoglycemic drugs the patient is taking are: None  Current management, blood sugar patterns and problems identified:  He had on his last visit been prescribed Januvia but he did not take this as it was too expensive and not clear why  He did not call to let us know that he did not get it  As before does not check blood sugars at home  He is now on a chemotherapy regimen with weekly 12 mg Decadron and likely has higher sugars from this  Although he has seen the dietitian he still has relatively high fat intake with eating Salsberry steak and mashed potatoes at lunch today  Has gained back some weight and looks like  Recent lab blood sugars have been mostly high ranging between 182 and 264  He has not done any walking for exercise            Glucose monitoring:  None  Dietician visit, most recent: 2/21  Weight history:  Wt Readings from Last 3 Encounters:  11/23/19 187 lb (84.8 kg)  11/14/19 178 lb 6.4 oz (80.9 kg)  10/31/19 177 lb 1.6 oz (80.3 kg)    Glycemic control:   Lab Results  Component Value Date   HGBA1C 7.8 (H) 09/04/2019   HGBA1C 7.9 (H) 05/23/2019   HGBA1C 7.1 (H) 01/24/2019   Lab Results  Component Value Date   MICROALBUR 12.6 (H)  07/21/2018   LDLCALC 39 09/04/2019   CREATININE 1.52 (H) 11/21/2019   Lab Results  Component Value Date   MICRALBCREAT 10.0 07/21/2018    Lab Results  Component Value Date   FRUCTOSAMINE 311 (H) 11/20/2019   FRUCTOSAMINE 322 (H) 03/10/2019    Office Visit on 11/23/2019  Component Date Value Ref Range Status  . POC Glucose 11/23/2019 264* 70 - 99 mg/dl Final  Appointment on 11/21/2019  Component Date Value Ref Range Status  . Sodium 11/21/2019 136  135 - 145 mmol/L Final  . Potassium 11/21/2019 3.6  3.5 - 5.1 mmol/L Final  . Chloride 11/21/2019 109  98 - 111 mmol/L Final  . CO2 11/21/2019 19* 22 - 32 mmol/L Final  . Glucose, Bld 11/21/2019 190* 70 - 99 mg/dL Final   Glucose reference range applies only to samples taken after fasting for at least 8 hours.  . BUN 11/21/2019 35* 8 - 23 mg/dL Final  . Creatinine 11/21/2019 1.52* 0.61 - 1.24 mg/dL Final  . Calcium 11/21/2019 8.1* 8.9 - 10.3 mg/dL Final  . Total Protein 11/21/2019 8.0  6.5 - 8.1 g/dL Final  . Albumin 11/21/2019 2.9* 3.5 - 5.0  g/dL Final  . AST 11/21/2019 9* 15 - 41 U/L Final  . ALT 11/21/2019 14  0 - 44 U/L Final  . Alkaline Phosphatase 11/21/2019 145* 38 - 126 U/L Final  . Total Bilirubin 11/21/2019 0.5  0.3 - 1.2 mg/dL Final  . GFR, Est Non Af Am 11/21/2019 48* >60 mL/min Final  . GFR, Est AFR Am 11/21/2019 55* >60 mL/min Final  . Anion gap 11/21/2019 8  5 - 15 Final   Performed at [facility], [place] [address], [place], [place] [address]  . WBC Count 11/21/2019 14.0* 4.0 - 10.5 K/uL Final  . RBC 11/21/2019 3.76* 4.22 - 5.81 MIL/uL Final  . Hemoglobin 11/21/2019 10.7* 13.0 - 17.0 g/dL Final  . HCT 11/21/2019 32.6* 39.0 - 52.0 % Final  . MCV 11/21/2019 86.7  80.0 - 100.0 fL Final  . MCH 11/21/2019 28.5  26.0 - 34.0 pg Final  . MCHC 11/21/2019 32.8  30.0 - 36.0 g/dL Final  . RDW 11/21/2019 16.4* 11.5 - 15.5 % Final  . Platelet Count 11/21/2019 268  150 - 400 K/uL Final  . nRBC  11/21/2019 0.0  0.0 - 0.2 % Final  . Neutrophils Relative % 11/21/2019 85  % Final  . Neutro Abs 11/21/2019 12.0* 1.7 - 7.7 K/uL Final  . Lymphocytes Relative 11/21/2019 4  % Final  . Lymphs Abs 11/21/2019 0.6* 0.7 - 4.0 K/uL Final  . Monocytes Relative 11/21/2019 10  % Final  . Monocytes Absolute 11/21/2019 1.4* 0.1 - 1.0 K/uL Final  . Eosinophils Relative 11/21/2019 0  % Final  . Eosinophils Absolute 11/21/2019 0.0  0.0 - 0.5 K/uL Final  . Basophils Relative 11/21/2019 0  % Final  . Basophils Absolute 11/21/2019 0.0  0.0 - 0.1 K/uL Final  . Immature Granulocytes 11/21/2019 1  % Final  . Abs Immature Granulocytes 11/21/2019 0.10* 0.00 - 0.07 K/uL Final   Performed at [facility], [place] [address], [place], [place] [address]  Lab on 11/20/2019  Component Date Value Ref Range Status  . Fructosamine 11/20/2019 311* 0 - 285 umol/L Final   Comment: Published reference interval for apparently healthy subjects between age 44 and 91 is 75 - 285 umol/L and in a poorly controlled diabetic population is 228 - 563 umol/L with a mean of 396 umol/L.   [name] Sodium 11/20/2019 135  135 - 145 mEq/L Final  . Potassium 11/20/2019 3.8  3.5 - 5.1 mEq/L Final  . Chloride 11/20/2019 107  96 - 112 mEq/L Final  . CO2 11/20/2019 23  19 - 32 mEq/L Final  . Glucose, Bld 11/20/2019 182* 70 - 99 mg/dL Final  . BUN 11/20/2019 41* 6 - 23 mg/dL Final  . Creatinine, Ser 11/20/2019 1.40  0.40 - 1.50 mg/dL Final  . GFR 11/20/2019 61.66  >60.00 mL/min Final  . Calcium 11/20/2019 8.7  8.4 - 10.5 mg/dL Final    Allergies as of 11/23/2019   No Known Allergies     Medication List       Accurate as of Nov 23, 2019  3:44 PM. If you have any questions, ask your nurse or doctor.        Accu-Chek Guide Me w/Device Kit 1 each by Does not apply route.   Accu-Chek Guide test strip Generic drug: glucose blood Use Accu Chek test strips as instructed to check blood sugar fasting or two hours after  meals, every other day.   Accu-Chek Softclix Lancets lancets  Use Accu Chek softclix lancets to check blood sugar fasting or 2 hours after a meal every other day. What changed:   how much to take  how to take this  when to take this  Another medication with the same name was removed. Continue taking this medication, and follow the directions you see here. Changed by: Jayme Cloud, LPN   acyclovir 478 MG tablet Commonly known as: ZOVIRAX Take 0.5 tablets (200 mg total) by mouth 2 (two) times daily.   amLODipine 5 MG tablet Commonly known as: NORVASC Take 1 tablet (5 mg total) by mouth daily.   aspirin 325 MG EC tablet Take 325 mg by mouth daily.   atorvastatin 80 MG tablet Commonly known as: LIPITOR Take 1 tablet (80 mg total) by mouth daily.   carvedilol 25 MG tablet Commonly known as: COREG Take 1 tablet (25 mg total) by mouth 2 (two) times daily with a meal.   dexamethasone 4 MG tablet Commonly known as: DECADRON 3 tabs ('12mg'$ ) orally with breakfast the day after each Daratumumab treatment.   hydrALAZINE 50 MG tablet Commonly known as: APRESOLINE TAKE 1 TABLET BY MOUTH THREE TIMES DAILY   ibuprofen 200 MG tablet Commonly known as: ADVIL Take 200 mg by mouth every 6 (six) hours as needed for moderate pain (for knee).   isosorbide mononitrate 30 MG 24 hr tablet Commonly known as: IMDUR Take 1 tablet (30 mg total) by mouth daily.   losartan 100 MG tablet Commonly known as: COZAAR Take 1 tablet by mouth once daily   ondansetron 8 MG tablet Commonly known as: Zofran Take 1 tablet (8 mg total) by mouth 2 (two) times daily as needed (Nausea or vomiting).   prochlorperazine 10 MG tablet Commonly known as: COMPAZINE Take 1 tablet (10 mg total) by mouth every 6 (six) hours as needed (Nausea or vomiting).       Allergies: No Known Allergies  Past Medical History:  Diagnosis Date  . Arthritis    "left knee" (11/24/2017)  . CAD (coronary artery disease)     CABG 2002  . CHF (congestive heart failure) ([place])   . Chronic renal insufficiency   . Diabetes mellitus without complication ([place])   . Dilated cardiomyopathy ([place])    echo in 2009 showed improvement with a normal EF  . HTN (hypertension)   . Hyperlipemia   . Noncompliance   . PAD (peripheral artery disease) ([place])     Past Surgical History:  Procedure Laterality Date  . CARDIAC CATHETERIZATION  2002, 2006  . CORONARY ARTERY BYPASS GRAFT  2002   x4 DR. VAN TRIGT  . IR FLUORO GUIDED NEEDLE PLC ASPIRATION/INJECTION LOC  10/24/2019    Family History  Problem Relation Age of Onset  . Hypertension Father   . Diabetes Mother        DIABETIC COMA    Social History:  reports that he has never smoked. He has never used smokeless tobacco. He reports that he does not drink alcohol or use drugs.   Review of Systems     Lipid history: On atorvastatin 80 mg since diagnosis of CAD, labs as follows    Lab Results  Component Value Date   CHOL 92 (L) 09/04/2019   HDL 35 (L) 09/04/2019   LDLCALC 39 09/04/2019   LDLDIRECT 149.4 01/07/2011   TRIG 89 09/04/2019   CHOLHDL 2.6 09/04/2019           Hypertension: Has been followed by cardiologist for blood pressure  control and appears to have persistently high readings  BP Readings from Last 3 Encounters:  11/23/19 (!) 160/80  11/21/19 (!) 158/87  11/14/19 (!) 156/85   RENAL dysfunction:    Lab Results  Component Value Date   CREATININE 1.52 (H) 11/21/2019   CREATININE 1.40 11/20/2019   CREATININE 1.54 (H) 11/14/2019    Most recent eye exam was in 2018  Most recent foot exam: 07/2018  Currently known complications of diabetes:none  LABS:  Office Visit on 11/23/2019  Component Date Value Ref Range Status  . POC Glucose 11/23/2019 264* 70 - 99 mg/dl Final  Appointment on 11/21/2019  Component Date Value Ref Range Status  . Sodium 11/21/2019 136  135 - 145 mmol/L Final  . Potassium 11/21/2019 3.6  3.5 - 5.1 mmol/L Final    . Chloride 11/21/2019 109  98 - 111 mmol/L Final  . CO2 11/21/2019 19* 22 - 32 mmol/L Final  . Glucose, Bld 11/21/2019 190* 70 - 99 mg/dL Final   Glucose reference range applies only to samples taken after fasting for at least 8 hours.  . BUN 11/21/2019 35* 8 - 23 mg/dL Final  . Creatinine 11/21/2019 1.52* 0.61 - 1.24 mg/dL Final  . Calcium 11/21/2019 8.1* 8.9 - 10.3 mg/dL Final  . Total Protein 11/21/2019 8.0  6.5 - 8.1 g/dL Final  . Albumin 11/21/2019 2.9* 3.5 - 5.0 g/dL Final  . AST 11/21/2019 9* 15 - 41 U/L Final  . ALT 11/21/2019 14  0 - 44 U/L Final  . Alkaline Phosphatase 11/21/2019 145* 38 - 126 U/L Final  . Total Bilirubin 11/21/2019 0.5  0.3 - 1.2 mg/dL Final  . GFR, Est Non Af Am 11/21/2019 48* >60 mL/min Final  . GFR, Est AFR Am 11/21/2019 55* >60 mL/min Final  . Anion gap 11/21/2019 8  5 - 15 Final   Performed at [facility], [place] [address] [place], [place] [address]  . WBC Count 11/21/2019 14.0* 4.0 - 10.5 K/uL Final  . RBC 11/21/2019 3.76* 4.22 - 5.81 MIL/uL Final  . Hemoglobin 11/21/2019 10.7* 13.0 - 17.0 g/dL Final  . HCT 11/21/2019 32.6* 39.0 - 52.0 % Final  . MCV 11/21/2019 86.7  80.0 - 100.0 fL Final  . MCH 11/21/2019 28.5  26.0 - 34.0 pg Final  . MCHC 11/21/2019 32.8  30.0 - 36.0 g/dL Final  . RDW 11/21/2019 16.4* 11.5 - 15.5 % Final  . Platelet Count 11/21/2019 268  150 - 400 K/uL Final  . nRBC 11/21/2019 0.0  0.0 - 0.2 % Final  . Neutrophils Relative % 11/21/2019 85  % Final  . Neutro Abs 11/21/2019 12.0* 1.7 - 7.7 K/uL Final  . Lymphocytes Relative 11/21/2019 4  % Final  . Lymphs Abs 11/21/2019 0.6* 0.7 - 4.0 K/uL Final  . Monocytes Relative 11/21/2019 10  % Final  . Monocytes Absolute 11/21/2019 1.4* 0.1 - 1.0 K/uL Final  . Eosinophils Relative 11/21/2019 0  % Final  . Eosinophils Absolute 11/21/2019 0.0  0.0 - 0.5 K/uL Final  . Basophils Relative 11/21/2019 0  % Final  . Basophils Absolute 11/21/2019 0.0  0.0 - 0.1 K/uL Final   . Immature Granulocytes 11/21/2019 1  % Final  . Abs Immature Granulocytes 11/21/2019 0.10* 0.00 - 0.07 K/uL Final   Performed at [facility], [place] [address], [place], [place] [address]  Lab on 11/20/2019  Component Date Value Ref Range Status  . Fructosamine 11/20/2019 311* 0 -  285 umol/L Final   Comment: Published reference interval for apparently healthy subjects between age 16 and 10 is 48 - 285 umol/L and in a poorly controlled diabetic population is 228 - 563 umol/L with a mean of 396 umol/L.   [name] Sodium 11/20/2019 135  135 - 145 mEq/L Final  . Potassium 11/20/2019 3.8  3.5 - 5.1 mEq/L Final  . Chloride 11/20/2019 107  96 - 112 mEq/L Final  . CO2 11/20/2019 23  19 - 32 mEq/L Final  . Glucose, Bld 11/20/2019 182* 70 - 99 mg/dL Final  . BUN 11/20/2019 41* 6 - 23 mg/dL Final  . Creatinine, Ser 11/20/2019 1.40  0.40 - 1.50 mg/dL Final  . GFR 11/20/2019 61.66  >60.00 mL/min Final  . Calcium 11/20/2019 8.7  8.4 - 10.5 mg/dL Final    Physical Examination:  BP (!) 160/80 (BP Location: Left Arm, Patient Position: Sitting, Cuff Size: Normal)   Pulse 74   Ht 5' 9.5" (1.765 m)   Wt 187 lb (84.8 kg)   SpO2 98%   BMI 27.22 kg/m          ASSESSMENT:  Diabetes type 2, mild  See history of present illness for detailed discussion of current diabetes management, blood sugar patterns and problems identified  He has had some worsening of his diabetes control with recent blood sugars about 180-200+ including after meals A1c last 7.8 and fructosamine is 311  Currently still not on any treatment, he did not pick up the Januvia prescription because of cost At least short-term he will tend to have higher readings because of taking weekly high-dose Decadron for his chemotherapy  He has difficulty understanding the diet and sometimes still has high fat meals Recently appears to be gaining weight  PLAN:    1. Glucose monitoring:He does need to try and restart  home glucose monitoring as discussed.  2.    Medication recommendations He will start RYBELSUS 3 mg daily in the morning Discussed with the patient the nature of GLP-1 drugs, the actions on insulin secretion, slowing stomach emptying, reduction of appetite and reduced liver glucose production Explained that Rybelsus improves blood sugar control as well as produces weight loss and reduces cardiovascular events. Explained possible side effects especially nausea and vomiting that may occur in the first few days; usually side effects improve with time.   Patient to call if nausea or vomiting does not improve within 2 weeks Instructed to take the capsules on empty stomach 30 minutes before breakfast with 4 ounces of water daily.  Patient education material given  Also needs to follow his diet as discussed with dietitian previously  3.  .  Hopefully we can get his insurance coverage for this on the next prescription  4.  Follow-up: 18monthand will reevaluate at that time and consider 7 mg dose  Hypertension: He needs to follow-up with his cardiologist to reevaluate blood pressure medications   Patient Instructions  Rybelsus improves blood sugar control as well as produces weight loss    take the capsules on empty stomach 30 minutes before breakfast with 4 ounces of water daily.       AElayne Snare5/13/2021, 3:44 PM   Note: This office note was prepared with Dragon voice recognition system technology. Any transcriptional errors that result from this process are unintentional.   AElayne Snare

## 2019-11-22 NOTE — Progress Notes (Signed)
Pharmacist Chemotherapy Monitoring - Follow Up Assessment    I verify that I have reviewed each item in the below checklist:  . Regimen for the patient is scheduled for the appropriate day and plan matches scheduled date. [name] Appropriate non-routine labs are ordered dependent on drug ordered. . If applicable, additional medications reviewed and ordered per protocol based on lifetime cumulative doses and/or treatment regimen.   Plan for follow-up and/or issues identified: No . I-vent associated with next due treatment: No . MD and/or nursing notified: No  Sahory Nordling K 11/22/2019 9:36 AM

## 2019-11-23 ENCOUNTER — Encounter: Payer: Self-pay | Admitting: Endocrinology

## 2019-11-23 ENCOUNTER — Ambulatory Visit: Payer: PRIVATE HEALTH INSURANCE | Admitting: Endocrinology

## 2019-11-23 ENCOUNTER — Other Ambulatory Visit: Payer: Self-pay

## 2019-11-23 ENCOUNTER — Ambulatory Visit: Payer: PRIVATE HEALTH INSURANCE | Admitting: Dietician

## 2019-11-23 ENCOUNTER — Encounter: Payer: No Typology Code available for payment source | Attending: Endocrinology | Admitting: Dietician

## 2019-11-23 ENCOUNTER — Ambulatory Visit (INDEPENDENT_AMBULATORY_CARE_PROVIDER_SITE_OTHER): Payer: No Typology Code available for payment source | Admitting: Endocrinology

## 2019-11-23 VITALS — BP 160/80 | HR 74 | Ht 69.5 in | Wt 187.0 lb

## 2019-11-23 DIAGNOSIS — E1165 Type 2 diabetes mellitus with hyperglycemia: Secondary | ICD-10-CM | POA: Diagnosis present

## 2019-11-23 LAB — GLUCOSE, POCT (MANUAL RESULT ENTRY): POC Glucose: 264 mg/dl — AB (ref 70–99)

## 2019-11-23 MED ORDER — ACCU-CHEK SOFTCLIX LANCETS MISC: 2 refills | Status: DC

## 2019-11-23 NOTE — Patient Instructions (Signed)
Rybelsus improves blood sugar control as well as produces weight loss    take the capsules on empty stomach 30 minutes before breakfast with 4 ounces of water daily.

## 2019-11-23 NOTE — Patient Instructions (Signed)
Walk for 20-30 minutes daily or do yard work. Continue to drink water (48-64 oz daily). Be sure you are choosing vegetables at each meal Choose whole wheat bread or whole wheat english muffin rather than white most often. Continue to avoid salt. Choose fresh meat rather than processed.  Continue to eat breakfast, lunch, dinner daily.  Follow up with me when you see Dr. [name] next.

## 2019-11-23 NOTE — Progress Notes (Signed)
Per instructions of Dr. [name], pt was given sample of Rybelsus 3mg  tablets. Sample is one month's worth of medications.  LOT:[id number] EX:05/2020

## 2019-11-23 NOTE — Progress Notes (Signed)
Diabetes Self-Management Education  Visit Type: Follow-up  Appt. Start Time: 1450 Appt. End Time: 6301  12/02/2019  [name], identified by name and date of birth, is a 64 y.o. male with a diagnosis of Diabetes:  .   ASSESSMENT Patient is here today alone. He has been undergoing treatment for multiple myeloma (chemotherapy and Decadron) Weight 186 lbs which has increased from 167 lbs 09/26/2019 He has been drinking 48-64 oz water daily Walking 20-30 minutes daily Diet high in fat and lacks vegetables eGFR improved from last visit increased to 52 on 11/28/19 Fructosamine 311 on 11/20/19 A1C 7.8% 09/04/19 He has not been checking his blood sugar as he does not have the correct needles for his lancing devise.    History includes:  Type 2 Diabetes dx in 2018, CAD, CABG 2002, CHF, HTN, HLD, PAD, GFR 48 05/2019  Patien tlives with his wife and she does the shopping and cooking.  He retired from [employer] and [employer] 08/17/19 Weight 186 lb (84.4 kg). Body mass index is 27.07 kg/m.  Diabetes Self-Management Education - 12/02/19 2210      Visit Information   Visit Type  Follow-up      Initial Visit   Are you taking your medications as prescribed?  Not on Medications      Psychosocial Assessment   Patient Belief/Attitude about Diabetes  Motivated to manage diabetes    Self-care barriers  None   multiple myeloma   Self-management support  Doctor's office;Family    Other persons present  Patient    Patient Concerns  Nutrition/Meal planning    Special Needs  None    Preferred Learning Style  No preference indicated    Learning Readiness  Ready      Pre-Education Assessment   Patient understands the diabetes disease and treatment process.  Demonstrates understanding / competency    Patient understands incorporating nutritional management into lifestyle.  Needs Review    Patient undertands incorporating physical activity into lifestyle.  Needs Review    Patient understands  using medications safely.  Demonstrates understanding / competency    Patient understands monitoring blood glucose, interpreting and using results  Demonstrates understanding / competency    Patient understands prevention, detection, and treatment of acute complications.  Demonstrates understanding / competency    Patient understands prevention, detection, and treatment of chronic complications.  Demonstrates understanding / competency    Patient understands how to develop strategies to address psychosocial issues.  Demonstrates understanding / competency    Patient understands how to develop strategies to promote health/change behavior.  Needs Review      Complications   How often do you check your blood sugar?  0 times/day (not testing)    Number of hypoglycemic episodes per month  0    Number of hyperglycemic episodes per week  0      Dietary Intake   Breakfast  sausage biscuit or english muffin    Lunch  salsbury steak, mashed potatoes, cabbage or leftovers    Snack (evening)  occaional cookies    Beverage(s)  water, occasional juice      Exercise   Exercise Type  Light (walking / raking leaves)      Patient Education   Previous Diabetes Education  Yes (please comment)   09/2019   Nutrition management   Meal options for control of blood glucose level and chronic complications.    Physical activity and exercise   Role of exercise on diabetes management, blood pressure control  and cardiac health.;Helped patient identify appropriate exercises in relation to his/her diabetes, diabetes complications and other health issue.    Monitoring  Purpose and frequency of SMBG.;Identified appropriate SMBG and/or A1C goals.      Individualized Goals (developed by patient)   Nutrition  General guidelines for healthy choices and portions discussed    Physical Activity  Exercise 3-5 times per week;30 minutes per day    Medications  take my medication as prescribed    Monitoring   test my blood  glucose as discussed    Reducing Risk  increase portions of healthy fats      Patient Self-Evaluation of Goals - Patient rates self as meeting previously set goals (% of time)   Nutrition  50 - 75 %    Physical Activity  25 - 50%    Medications  Not Applicable    Monitoring  25 - 50%    Problem Solving  50 - 75 %    Reducing Risk  50 - 75 %    Health Coping  >75%      Post-Education Assessment   Patient understands the diabetes disease and treatment process.  Demonstrates understanding / competency    Patient understands incorporating nutritional management into lifestyle.  Needs Review    Patient undertands incorporating physical activity into lifestyle.  Demonstrates understanding / competency    Patient understands using medications safely.  Demonstrates understanding / competency    Patient understands monitoring blood glucose, interpreting and using results  Demonstrates understanding / competency    Patient understands prevention, detection, and treatment of acute complications.  Demonstrates understanding / competency    Patient understands prevention, detection, and treatment of chronic complications.  Demonstrates understanding / competency    Patient understands how to develop strategies to address psychosocial issues.  Demonstrates understanding / competency    Patient understands how to develop strategies to promote health/change behavior.  Needs Review      Outcomes   Expected Outcomes  Demonstrated interest in learning. Expect positive outcomes    Future DMSE  2 months    Program Status  Completed      Subsequent Visit   Since your last visit have you continued or begun to take your medications as prescribed?  Not on Medications    Since your last visit have you experienced any weight changes?  Gain    Weight Gain (lbs)  19    Since your last visit, are you checking your blood glucose at least once a day?  No       Individualized Plan for Diabetes Self-Management  Training:   Learning Objective:  Patient will have a greater understanding of diabetes self-management. Patient education plan is to attend individual and/or group sessions per assessed needs and concerns.   Plan:   Patient Instructions  Walk for 20-30 minutes daily or do yard work. Continue to drink water (48-64 oz daily). Be sure you are choosing vegetables at each meal Choose whole wheat bread or whole wheat english muffin rather than white most often. Continue to avoid salt. Choose fresh meat rather than processed.  Continue to eat breakfast, lunch, dinner daily.  Follow up with me when you see Dr. [name] next.   Expected Outcomes:  Demonstrated interest in learning. Expect positive outcomes  Education material provided:   If problems or questions, patient to contact team via:  Phone  Future DSME appointment: 2 months

## 2019-11-28 ENCOUNTER — Inpatient Hospital Stay: Payer: PRIVATE HEALTH INSURANCE

## 2019-11-28 ENCOUNTER — Other Ambulatory Visit: Payer: Self-pay

## 2019-11-28 ENCOUNTER — Other Ambulatory Visit: Payer: Self-pay | Admitting: *Deleted

## 2019-11-28 VITALS — BP 139/68 | HR 68 | Temp 98.0°F | Resp 18

## 2019-11-28 DIAGNOSIS — C9000 Multiple myeloma not having achieved remission: Secondary | ICD-10-CM

## 2019-11-28 DIAGNOSIS — Z5112 Encounter for antineoplastic immunotherapy: Secondary | ICD-10-CM | POA: Diagnosis not present

## 2019-11-28 DIAGNOSIS — Z7189 Other specified counseling: Secondary | ICD-10-CM

## 2019-11-28 LAB — CMP (CANCER CENTER ONLY)
ALT: 12 U/L (ref 0–44)
AST: 11 U/L — ABNORMAL LOW (ref 15–41)
Albumin: 2.5 g/dL — ABNORMAL LOW (ref 3.5–5.0)
Alkaline Phosphatase: 88 U/L (ref 38–126)
Anion gap: 9 (ref 5–15)
BUN: 22 mg/dL (ref 8–23)
CO2: 21 mmol/L — ABNORMAL LOW (ref 22–32)
Calcium: 8.1 mg/dL — ABNORMAL LOW (ref 8.9–10.3)
Chloride: 108 mmol/L (ref 98–111)
Creatinine: 1.61 mg/dL — ABNORMAL HIGH (ref 0.61–1.24)
GFR, Est AFR Am: 52 mL/min — ABNORMAL LOW (ref >60)
GFR, Estimated: 45 mL/min — ABNORMAL LOW (ref >60)
Glucose, Bld: 218 mg/dL — ABNORMAL HIGH (ref 70–99)
Potassium: 3.5 mmol/L (ref 3.5–5.1)
Sodium: 138 mmol/L (ref 135–145)
Total Bilirubin: 0.5 mg/dL (ref 0.3–1.2)
Total Protein: 7.3 g/dL (ref 6.5–8.1)

## 2019-11-28 LAB — CBC WITH DIFFERENTIAL (CANCER CENTER ONLY)
Abs Immature Granulocytes: 0.04 10*3/uL (ref 0.00–0.07)
Basophils Absolute: 0 10*3/uL (ref 0.0–0.1)
Basophils Relative: 0 %
Eosinophils Absolute: 0 10*3/uL (ref 0.0–0.5)
Eosinophils Relative: 0 %
HCT: 29.3 % — ABNORMAL LOW (ref 39.0–52.0)
Hemoglobin: 9.5 g/dL — ABNORMAL LOW (ref 13.0–17.0)
Immature Granulocytes: 1 %
Lymphocytes Relative: 9 %
Lymphs Abs: 0.6 10*3/uL — ABNORMAL LOW (ref 0.7–4.0)
MCH: 28.2 pg (ref 26.0–34.0)
MCHC: 32.4 g/dL (ref 30.0–36.0)
MCV: 86.9 fL (ref 80.0–100.0)
Monocytes Absolute: 0.8 10*3/uL (ref 0.1–1.0)
Monocytes Relative: 11 %
Neutro Abs: 5.4 10*3/uL (ref 1.7–7.7)
Neutrophils Relative %: 79 %
Platelet Count: 213 10*3/uL (ref 150–400)
RBC: 3.37 MIL/uL — ABNORMAL LOW (ref 4.22–5.81)
RDW: 16.2 % — ABNORMAL HIGH (ref 11.5–15.5)
WBC Count: 6.8 10*3/uL (ref 4.0–10.5)
nRBC: 0 % (ref 0.0–0.2)

## 2019-11-28 LAB — SAMPLE TO BLOOD BANK

## 2019-11-28 MED ORDER — SODIUM CHLORIDE 0.9 % IV SOLN
40.0000 mg | Freq: Once | INTRAVENOUS | Status: AC
  Administered 2019-11-28: 40 mg via INTRAVENOUS
  Filled 2019-11-28: qty 4

## 2019-11-28 MED ORDER — SODIUM CHLORIDE 0.9 % IV SOLN
16.0000 mg/kg | Freq: Once | INTRAVENOUS | Status: AC
  Administered 2019-11-28: 1300 mg via INTRAVENOUS
  Filled 2019-11-28: qty 60

## 2019-11-28 MED ORDER — METHYLPREDNISOLONE SODIUM SUCC 125 MG IJ SOLR
100.0000 mg | Freq: Once | INTRAMUSCULAR | Status: AC
  Administered 2019-11-28: 100 mg via INTRAVENOUS

## 2019-11-28 MED ORDER — MONTELUKAST SODIUM 10 MG PO TABS
ORAL_TABLET | ORAL | Status: AC
  Filled 2019-11-28: qty 1

## 2019-11-28 MED ORDER — DIPHENHYDRAMINE HCL 25 MG PO CAPS
ORAL_CAPSULE | ORAL | Status: AC
  Filled 2019-11-28: qty 2

## 2019-11-28 MED ORDER — MONTELUKAST SODIUM 10 MG PO TABS
10.0000 mg | ORAL_TABLET | Freq: Once | ORAL | Status: AC
  Administered 2019-11-28: 10 mg via ORAL

## 2019-11-28 MED ORDER — SODIUM CHLORIDE 0.9 % IV SOLN
40.0000 mg | Freq: Once | INTRAVENOUS | Status: DC
  Filled 2019-11-28: qty 4

## 2019-11-28 MED ORDER — ACETAMINOPHEN 325 MG PO TABS
ORAL_TABLET | ORAL | Status: AC
  Filled 2019-11-28: qty 2

## 2019-11-28 MED ORDER — SODIUM CHLORIDE 0.9 % IV SOLN
Freq: Once | INTRAVENOUS | Status: AC
  Filled 2019-11-28: qty 250

## 2019-11-28 MED ORDER — MEPERIDINE HCL 25 MG/ML IJ SOLN
25.0000 mg | Freq: Once | INTRAMUSCULAR | Status: AC
  Administered 2019-11-28: 25 mg via INTRAVENOUS

## 2019-11-28 MED ORDER — METHYLPREDNISOLONE SODIUM SUCC 125 MG IJ SOLR
INTRAMUSCULAR | Status: AC
  Filled 2019-11-28: qty 2

## 2019-11-28 MED ORDER — DIPHENHYDRAMINE HCL 25 MG PO CAPS
50.0000 mg | ORAL_CAPSULE | Freq: Once | ORAL | Status: AC
  Administered 2019-11-28: 50 mg via ORAL

## 2019-11-28 MED ORDER — MEPERIDINE HCL 25 MG/ML IJ SOLN
INTRAMUSCULAR | Status: AC
  Filled 2019-11-28: qty 1

## 2019-11-28 MED ORDER — ACETAMINOPHEN 325 MG PO TABS
650.0000 mg | ORAL_TABLET | Freq: Once | ORAL | Status: AC
  Administered 2019-11-28: 650 mg via ORAL

## 2019-11-28 NOTE — Patient Instructions (Signed)
[facility] Discharge Instructions for Patients Receiving Chemotherapy  Today you received the following chemotherapy agents: Darzalex  To help prevent nausea and vomiting after your treatment, we encourage you to take your nausea medication as directed.    If you develop nausea and vomiting that is not controlled by your nausea medication, call the clinic.   BELOW ARE SYMPTOMS THAT SHOULD BE REPORTED IMMEDIATELY:  *FEVER GREATER THAN 100.5 F  *CHILLS WITH OR WITHOUT FEVER  NAUSEA AND VOMITING THAT IS NOT CONTROLLED WITH YOUR NAUSEA MEDICATION  *UNUSUAL SHORTNESS OF BREATH  *UNUSUAL BRUISING OR BLEEDING  TENDERNESS IN MOUTH AND THROAT WITH OR WITHOUT PRESENCE OF ULCERS  *URINARY PROBLEMS  *BOWEL PROBLEMS  UNUSUAL RASH Items with * indicate a potential emergency and should be followed up as soon as possible.  Feel free to call the clinic should you have any questions or concerns. The clinic phone number is [phone].  Please show the CHEMO ALERT CARD at check-in to the Emergency Department and triage nurse.   

## 2019-12-01 ENCOUNTER — Other Ambulatory Visit: Payer: Self-pay

## 2019-12-01 DIAGNOSIS — C9000 Multiple myeloma not having achieved remission: Secondary | ICD-10-CM

## 2019-12-02 ENCOUNTER — Encounter: Payer: Self-pay | Admitting: Dietician

## 2019-12-04 ENCOUNTER — Other Ambulatory Visit: Payer: Self-pay

## 2019-12-04 ENCOUNTER — Inpatient Hospital Stay: Payer: PRIVATE HEALTH INSURANCE

## 2019-12-04 ENCOUNTER — Inpatient Hospital Stay (HOSPITAL_BASED_OUTPATIENT_CLINIC_OR_DEPARTMENT_OTHER): Payer: PRIVATE HEALTH INSURANCE | Admitting: Hematology

## 2019-12-04 VITALS — BP 151/71 | HR 78 | Temp 97.9°F | Resp 18 | Ht 69.5 in | Wt 178.6 lb

## 2019-12-04 VITALS — BP 155/77 | HR 61 | Temp 97.7°F | Resp 17

## 2019-12-04 DIAGNOSIS — C9000 Multiple myeloma not having achieved remission: Secondary | ICD-10-CM | POA: Diagnosis not present

## 2019-12-04 DIAGNOSIS — D649 Anemia, unspecified: Secondary | ICD-10-CM

## 2019-12-04 DIAGNOSIS — Z7189 Other specified counseling: Secondary | ICD-10-CM

## 2019-12-04 DIAGNOSIS — Z5112 Encounter for antineoplastic immunotherapy: Secondary | ICD-10-CM | POA: Diagnosis not present

## 2019-12-04 LAB — CMP (CANCER CENTER ONLY)
ALT: 12 U/L (ref 0–44)
AST: 8 U/L — ABNORMAL LOW (ref 15–41)
Albumin: 2.6 g/dL — ABNORMAL LOW (ref 3.5–5.0)
Alkaline Phosphatase: 80 U/L (ref 38–126)
Anion gap: 9 (ref 5–15)
BUN: 21 mg/dL (ref 8–23)
CO2: 25 mmol/L (ref 22–32)
Calcium: 8.4 mg/dL — ABNORMAL LOW (ref 8.9–10.3)
Chloride: 107 mmol/L (ref 98–111)
Creatinine: 1.7 mg/dL — ABNORMAL HIGH (ref 0.61–1.24)
GFR, Est AFR Am: 48 mL/min — ABNORMAL LOW (ref >60)
GFR, Estimated: 42 mL/min — ABNORMAL LOW (ref >60)
Glucose, Bld: 192 mg/dL — ABNORMAL HIGH (ref 70–99)
Potassium: 3.5 mmol/L (ref 3.5–5.1)
Sodium: 141 mmol/L (ref 135–145)
Total Bilirubin: 0.4 mg/dL (ref 0.3–1.2)
Total Protein: 7.4 g/dL (ref 6.5–8.1)

## 2019-12-04 LAB — CBC WITH DIFFERENTIAL (CANCER CENTER ONLY)
Abs Immature Granulocytes: 0.02 10*3/uL (ref 0.00–0.07)
Basophils Absolute: 0 10*3/uL (ref 0.0–0.1)
Basophils Relative: 0 %
Eosinophils Absolute: 0 10*3/uL (ref 0.0–0.5)
Eosinophils Relative: 0 %
HCT: 30.3 % — ABNORMAL LOW (ref 39.0–52.0)
Hemoglobin: 9.6 g/dL — ABNORMAL LOW (ref 13.0–17.0)
Immature Granulocytes: 0 %
Lymphocytes Relative: 12 %
Lymphs Abs: 0.5 10*3/uL — ABNORMAL LOW (ref 0.7–4.0)
MCH: 27.7 pg (ref 26.0–34.0)
MCHC: 31.7 g/dL (ref 30.0–36.0)
MCV: 87.6 fL (ref 80.0–100.0)
Monocytes Absolute: 0.3 10*3/uL (ref 0.1–1.0)
Monocytes Relative: 6 %
Neutro Abs: 3.7 10*3/uL (ref 1.7–7.7)
Neutrophils Relative %: 82 %
Platelet Count: 244 10*3/uL (ref 150–400)
RBC: 3.46 MIL/uL — ABNORMAL LOW (ref 4.22–5.81)
RDW: 15.1 % (ref 11.5–15.5)
WBC Count: 4.5 10*3/uL (ref 4.0–10.5)
nRBC: 0 % (ref 0.0–0.2)

## 2019-12-04 MED ORDER — METHYLPREDNISOLONE SODIUM SUCC 125 MG IJ SOLR
100.0000 mg | Freq: Once | INTRAMUSCULAR | Status: AC
  Administered 2019-12-04: 100 mg via INTRAVENOUS

## 2019-12-04 MED ORDER — MONTELUKAST SODIUM 10 MG PO TABS
ORAL_TABLET | ORAL | Status: AC
  Filled 2019-12-04: qty 1

## 2019-12-04 MED ORDER — ACETAMINOPHEN 325 MG PO TABS
650.0000 mg | ORAL_TABLET | Freq: Once | ORAL | Status: AC
  Administered 2019-12-04: 650 mg via ORAL

## 2019-12-04 MED ORDER — ACETAMINOPHEN 325 MG PO TABS
ORAL_TABLET | ORAL | Status: AC
  Filled 2019-12-04: qty 2

## 2019-12-04 MED ORDER — DIPHENHYDRAMINE HCL 50 MG/ML IJ SOLN
INTRAMUSCULAR | Status: AC
  Filled 2019-12-04: qty 1

## 2019-12-04 MED ORDER — DIPHENHYDRAMINE HCL 25 MG PO CAPS
50.0000 mg | ORAL_CAPSULE | Freq: Once | ORAL | Status: AC
  Administered 2019-12-04: 50 mg via ORAL

## 2019-12-04 MED ORDER — METHYLPREDNISOLONE SODIUM SUCC 125 MG IJ SOLR
INTRAMUSCULAR | Status: AC
  Filled 2019-12-04: qty 2

## 2019-12-04 MED ORDER — SODIUM CHLORIDE 0.9 % IV SOLN
Freq: Once | INTRAVENOUS | Status: AC
  Filled 2019-12-04: qty 250

## 2019-12-04 MED ORDER — SODIUM CHLORIDE 0.9 % IV SOLN
16.0000 mg/kg | Freq: Once | INTRAVENOUS | Status: AC
  Administered 2019-12-04: 1300 mg via INTRAVENOUS
  Filled 2019-12-04: qty 60

## 2019-12-04 MED ORDER — MONTELUKAST SODIUM 10 MG PO TABS
10.0000 mg | ORAL_TABLET | Freq: Once | ORAL | Status: AC
  Administered 2019-12-04: 10 mg via ORAL

## 2019-12-04 MED ORDER — MEPERIDINE HCL 25 MG/ML IJ SOLN
25.0000 mg | Freq: Once | INTRAMUSCULAR | Status: AC
  Administered 2019-12-04: 25 mg via INTRAVENOUS

## 2019-12-04 MED ORDER — MEPERIDINE HCL 25 MG/ML IJ SOLN
INTRAMUSCULAR | Status: AC
  Filled 2019-12-04: qty 1

## 2019-12-04 MED ORDER — DIPHENHYDRAMINE HCL 25 MG PO CAPS
ORAL_CAPSULE | ORAL | Status: AC
  Filled 2019-12-04: qty 2

## 2019-12-04 MED ORDER — SODIUM CHLORIDE 0.9 % IV SOLN
40.0000 mg | Freq: Once | INTRAVENOUS | Status: AC
  Administered 2019-12-04: 40 mg via INTRAVENOUS
  Filled 2019-12-04: qty 4

## 2019-12-04 NOTE — Progress Notes (Signed)
HEMATOLOGY/ONCOLOGY CONSULTATION NOTE  Date of Service: 12/04/2019  Patient Care Team: [name], Joseph Halsted, Welch as PCP - General (Internal Medicine) [name] as PCP - Cardiology (Cardiology)  CHIEF COMPLAINTS/PURPOSE OF CONSULTATION:  Recently diagnosed myeloma  HISTORY OF PRESENTING ILLNESS:  [name] is a wonderful 64 y.o. male who has been referred to [place] by [name] for evaluation and management of elevated protein. Pt is accompanied today by his wife, [name]. The pt reports that he is doing well overall.   The pt reports he was first told that he had Diabetes two years ago. He has not previously needed to be on medications for his diabetes but has recently been started on Januvia by [name], his Endocrinologist. His blood glucose was 149 this morning. He sees [name] and [name] for Cardiology. Pt has CAD and had to have open heart surgery 20 years ago. He also has HTN that he feels has been stable. His Cardiology team is currently managing his heart medications and recently told pt to discontinue taking Furosemide due kidney function on last labs. [name] and [name] have been essentially functioning as his primary care, as he does not have a PCP at this time. He has an upcoming appointment with [name] in the beginning of May and [name] next Tuesday. Pt is currently using Ibuprofen a couple of times per week for his knee pain.  Pt has felt the same in the last 3-6 months and denies any new bone pain of any kinds. He is eating, functioning, and moving about as normal. He has lost about 13 lbs over the last few months. Pt does not smoke and does not drink much alcohol.   Most recent lab results (09/04/2019) of CBC, BMP and Hepatic function panel is as follows: all values are WNL except for RBC at 3.64, Hgb at 9.8, HCT at 29.8, Glucose at 129, BUN at 64, Creatinine at 2.62, GFR Est Af Am at 29, CO2 at 15,  Total Protein at 10.0, Albumin at 3.6, ALP at 163, ALT at 45.   On review of systems, pt reports unexpected weight loss, left knee pain and denies new back pain, new shoulder pain, new hip pain, chest pain, SOB, leg swelling, testicular pain/swelling and any other symptoms.   On PMHx the pt reports Left Knee Arthritis, CAD, CHF, Type II Diabetes, HTN, HLD, Cardiac Catheterization, Coronary Artery Bypass Graft x4 (2002). On Social Hx the pt reports that he is a non-smoker and does not drink much alcohol.  INTERVAL HISTORY:   [name] is a wonderful 64 y.o. male is here for evaluation and management of newly diagnosed myeloma. The patient's last visit with [place] was on 11/21/2019. The pt reports that he is doing well overall.  The pt reports that he has been well and denies any new symptoms. Pt has not had to use any medications for nausea. His diabetes medications have been adjusted to control glucose levels since beginning steroids. Pt has continued walking regularly and is eating and sleeping well.   Lab results today (12/04/19) of CBC w/diff and CMP is as follows: all values are WNL except for RBC at 3.46, Hgb at 9.6, HCT at 30.3, Lymphs Abs at 0.5K, Glucose at 192, BUN at 1.70, Calcium at 8.4, Albumin at 2.6, AST at 8, GFR Est Afr Am at 48.  On review of systems, pt reports right knee pain  and denies nausea, vomiting, chills, fevers, leg swelling, abdominal pain, skin rashes, fatigue, back pain, hip pain, low appetite, sleeplessness and any other symptoms.   MEDICAL HISTORY:  Past Medical History:  Diagnosis Date  . Arthritis    "left knee" (11/24/2017)  . CAD (coronary artery disease)    CABG 2002  . CHF (congestive heart failure) ([place])   . Chronic renal insufficiency   . Diabetes mellitus without complication ([place])   . Dilated cardiomyopathy ([place])    echo in 2009 showed improvement with a normal EF  . HTN (hypertension)   . Hyperlipemia   . Noncompliance   . PAD  (peripheral artery disease) ([place])     SURGICAL HISTORY: Past Surgical History:  Procedure Laterality Date  . CARDIAC CATHETERIZATION  2002, 2006  . CORONARY ARTERY BYPASS GRAFT  2002   x4 [name]  . IR FLUORO GUIDED NEEDLE PLC ASPIRATION/INJECTION LOC  10/24/2019    SOCIAL HISTORY: Social History   Socioeconomic History  . Marital status: Married    Spouse name: Not on file  . Number of children: 1  . Years of education: Not on file  . Highest education level: Not on file  Occupational History  . Occupation: Furniture conservator/restorer  Tobacco Use  . Smoking status: Never Smoker  . Smokeless tobacco: Never Used  Substance and Sexual Activity  . Alcohol use: No  . Drug use: No  . Sexual activity: Yes  Other Topics Concern  . Not on file  Social History Narrative  . Not on file   Social Determinants of Health   Financial Resource Strain:   . Difficulty of Paying Living Expenses:   Food Insecurity:   . Worried About Charity fundraiser in the Last Year:   . Arboriculturist in the Last Year:   Transportation Needs:   . Film/video editor (Medical):   [name] Lack of Transportation (Non-Medical):   Physical Activity:   . Days of Exercise per Week:   . Minutes of Exercise per Session:   Stress:   . Feeling of Stress :   Social Connections:   . Frequency of Communication with Friends and Family:   . Frequency of Social Gatherings with Friends and Family:   . Attends Religious Services:   . Active Member of Clubs or Organizations:   . Attends Archivist Meetings:   [name] Marital Status:   Intimate Partner Violence:   . Fear of Current or Ex-Partner:   . Emotionally Abused:   [name] Physically Abused:   . Sexually Abused:     FAMILY HISTORY: Family History  Problem Relation Age of Onset  . Hypertension Father   . Diabetes Mother        DIABETIC COMA    ALLERGIES:  has No Known Allergies.  MEDICATIONS:  Current Outpatient Medications  Medication Sig Dispense  Refill  . Accu-Chek Softclix Lancets lancets Use Accu Chek softclix lancets to check blood sugar fasting or 2 hours after a meal every other day. 100 each 2  . acyclovir (ZOVIRAX) 400 MG tablet Take 0.5 tablets (200 mg total) by mouth 2 (two) times daily. 60 tablet 11  . amLODipine (NORVASC) 5 MG tablet Take 1 tablet (5 mg total) by mouth daily. 90 tablet 3  . aspirin 325 MG EC tablet Take 325 mg by mouth daily.     [name] atorvastatin (LIPITOR) 80 MG tablet Take 1 tablet (80 mg total) by mouth daily. 90 tablet 3  .  Blood Glucose Monitoring Suppl (ACCU-CHEK GUIDE ME) w/Device KIT 1 each by Does not apply route.    . carvedilol (COREG) 25 MG tablet Take 1 tablet (25 mg total) by mouth 2 (two) times daily with a meal. 180 tablet 3  . dexamethasone (DECADRON) 4 MG tablet 3 tabs (75m) orally with breakfast the day after each Daratumumab treatment. 60 tablet 2  . glucose blood (ACCU-CHEK GUIDE) test strip Use Accu Chek test strips as instructed to check blood sugar fasting or two hours after meals, every other day. 100 each 2  . hydrALAZINE (APRESOLINE) 50 MG tablet TAKE 1 TABLET BY MOUTH THREE TIMES DAILY 270 tablet 2  . ibuprofen (ADVIL,MOTRIN) 200 MG tablet Take 200 mg by mouth every 6 (six) hours as needed for moderate pain (for knee).    . isosorbide mononitrate (IMDUR) 30 MG 24 hr tablet Take 1 tablet (30 mg total) by mouth daily. 90 tablet 3  . losartan (COZAAR) 100 MG tablet Take 1 tablet by mouth once daily 90 tablet 1  . ondansetron (ZOFRAN) 8 MG tablet Take 1 tablet (8 mg total) by mouth 2 (two) times daily as needed (Nausea or vomiting). 30 tablet 1  . prochlorperazine (COMPAZINE) 10 MG tablet Take 1 tablet (10 mg total) by mouth every 6 (six) hours as needed (Nausea or vomiting). 30 tablet 1   No current facility-administered medications for this visit.    REVIEW OF SYSTEMS:  A 10+ POINT REVIEW OF SYSTEMS WAS OBTAINED including neurology, dermatology, psychiatry, cardiac, respiratory, lymph,  extremities, GI, GU, Musculoskeletal, constitutional, breasts, reproductive, HEENT.  All pertinent positives are noted in the HPI.  All others are negative.   PHYSICAL EXAMINATION: ECOG PERFORMANCE STATUS: 0 - Asymptomatic  VS reviewed - stable  Exam was given in a chair   GENERAL:alert, in no acute distress and comfortable SKIN: no acute rashes, no significant lesions EYES: conjunctiva are pink and non-injected, sclera anicteric OROPHARYNX: MMM, no exudates, no oropharyngeal erythema or ulceration NECK: supple, no JVD LYMPH:  no palpable lymphadenopathy in the cervical, axillary or inguinal regions LUNGS: clear to auscultation b/l with normal respiratory effort HEART: regular rate & rhythm ABDOMEN:  normoactive bowel sounds , non tender, not distended. No palpable hepatosplenomegaly.  Extremity: no pedal edema PSYCH: alert & oriented x 3 with fluent speech NEURO: no focal motor/sensory deficits  LABORATORY DATA:  I have reviewed the data as listed  . CBC Latest Ref Rng & Units 12/04/2019 11/28/2019 11/21/2019  WBC 4.0 - 10.5 K/uL 4.5 6.8 14.0(H)  Hemoglobin 13.0 - 17.0 g/dL 9.6(L) 9.5(L) 10.7(L)  Hematocrit 39.0 - 52.0 % 30.3(L) 29.3(L) 32.6(L)  Platelets 150 - 400 K/uL 244 213 268    . CMP Latest Ref Rng & Units 12/04/2019 11/28/2019 11/21/2019  Glucose 70 - 99 mg/dL 192(H) 218(H) 190(H)  BUN 8 - 23 mg/dL 21 22 35(H)  Creatinine 0.61 - 1.24 mg/dL 1.70(H) 1.61(H) 1.52(H)  Sodium 135 - 145 mmol/L 141 138 136  Potassium 3.5 - 5.1 mmol/L 3.5 3.5 3.6  Chloride 98 - 111 mmol/L 107 108 109  CO2 22 - 32 mmol/L 25 21(L) 19(L)  Calcium 8.9 - 10.3 mg/dL 8.4(L) 8.1(L) 8.1(L)  Total Protein 6.5 - 8.1 g/dL 7.4 7.3 8.0  Total Bilirubin 0.3 - 1.2 mg/dL 0.4 0.5 0.5  Alkaline Phos 38 - 126 U/L 80 88 145(H)  AST 15 - 41 U/L 8(L) 11(L) 9(L)  ALT 0 - 44 U/L _0 10/24/2019 FISH Panel:  10/24/2019 [facility] Surgical Pathology:    RADIOGRAPHIC STUDIES: I have personally reviewed the  radiological images as listed and agreed with the findings in the report. No results found.  ASSESSMENT & PLAN:    64 yo with   1)  Newly diagnosed Multiple myeloma -09/27/19 Welch Protein at 3.4 -10/25/2019 PET/CT ([id number]) revealed "1. No specific myelomatous lesions are identified. 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  2) Anemia ? Related to CKD vs ? Plasma cell dyscrasia 3) CKD ?etiology - HTN ? Cardiac issues ? Myeloma related.  PLAN: -Discussed pt labwork today, 12/04/19; blood counts are steady, blood counts are stable -The pt has no prohibitive toxicities from continuing C1D22 Daratumumab/Dexamethasone at this time. -Advised pt that we plan to begin biweekly treatments (every two weeks) with C3 -Continue taking Dexamethasone with breakfast the day after each treatment -Will see back in 4 weeks with labs   FOLLOW UP: PLz schedule C2 of Daratumumab treatment (weekly x 4 doses) Labs with each treatment Welch visit in 4 weeks   The total time spent in the appt was 15 minutes and more than 50% was on counseling and direct patient cares.  All of the patient's questions were answered with apparent satisfaction. The patient knows to call the clinic with any problems, questions or concerns.    Sullivan Lone Welch [place] AAHIVMS [facility] Hematology/Oncology Physician [facility]  (Office):        [phone] (Work cell):  [phone] (Fax):           [phone]  12/04/2019 11:08 AM  I, Yevette Edwards, am acting as a scribe for Joseph. Sullivan Lone.   ..gkcs .Brunetta Genera Welch

## 2019-12-04 NOTE — Patient Instructions (Signed)
[facility] Discharge Instructions for Patients Receiving Chemotherapy  Today you received the following chemotherapy agents: Darzalex  To help prevent nausea and vomiting after your treatment, we encourage you to take your nausea medication as directed.    If you develop nausea and vomiting that is not controlled by your nausea medication, call the clinic.   BELOW ARE SYMPTOMS THAT SHOULD BE REPORTED IMMEDIATELY:  *FEVER GREATER THAN 100.5 F  *CHILLS WITH OR WITHOUT FEVER  NAUSEA AND VOMITING THAT IS NOT CONTROLLED WITH YOUR NAUSEA MEDICATION  *UNUSUAL SHORTNESS OF BREATH  *UNUSUAL BRUISING OR BLEEDING  TENDERNESS IN MOUTH AND THROAT WITH OR WITHOUT PRESENCE OF ULCERS  *URINARY PROBLEMS  *BOWEL PROBLEMS  UNUSUAL RASH Items with * indicate a potential emergency and should be followed up as soon as possible.  Feel free to call the clinic should you have any questions or concerns. The clinic phone number is [phone].  Please show the CHEMO ALERT CARD at check-in to the Emergency Department and triage nurse.   

## 2019-12-05 NOTE — Progress Notes (Signed)
Pharmacist Chemotherapy Monitoring - Follow Up Assessment    I verify that I have reviewed each item in the below checklist:  . Regimen for the patient is scheduled for the appropriate day and plan matches scheduled date. [name] Appropriate non-routine labs are ordered dependent on drug ordered. . If applicable, additional medications reviewed and ordered per protocol based on lifetime cumulative doses and/or treatment regimen.   Plan for follow-up and/or issues identified: No . I-vent associated with next due treatment: Yes . MD and/or nursing notified: No  Kennith Center, Pharm.D., CPP 12/05/2019@11 :03 AM

## 2019-12-06 ENCOUNTER — Telehealth: Payer: Self-pay | Admitting: Hematology

## 2019-12-06 ENCOUNTER — Encounter: Payer: Self-pay | Admitting: Internal Medicine

## 2019-12-06 NOTE — Telephone Encounter (Signed)
Scheduled per los, patient has been called and voicemail was left. 

## 2019-12-07 ENCOUNTER — Encounter: Payer: Self-pay | Admitting: Internal Medicine

## 2019-12-07 ENCOUNTER — Encounter: Payer: Self-pay | Admitting: Hematology

## 2019-12-08 ENCOUNTER — Other Ambulatory Visit: Payer: Self-pay

## 2019-12-08 DIAGNOSIS — C9000 Multiple myeloma not having achieved remission: Secondary | ICD-10-CM

## 2019-12-12 ENCOUNTER — Inpatient Hospital Stay: Payer: No Typology Code available for payment source

## 2019-12-12 ENCOUNTER — Other Ambulatory Visit: Payer: Self-pay

## 2019-12-12 ENCOUNTER — Inpatient Hospital Stay: Payer: No Typology Code available for payment source | Attending: Hematology

## 2019-12-12 VITALS — BP 130/64 | HR 70 | Temp 97.5°F | Resp 18

## 2019-12-12 DIAGNOSIS — E1122 Type 2 diabetes mellitus with diabetic chronic kidney disease: Secondary | ICD-10-CM | POA: Insufficient documentation

## 2019-12-12 DIAGNOSIS — Z7982 Long term (current) use of aspirin: Secondary | ICD-10-CM | POA: Insufficient documentation

## 2019-12-12 DIAGNOSIS — Z5112 Encounter for antineoplastic immunotherapy: Secondary | ICD-10-CM | POA: Insufficient documentation

## 2019-12-12 DIAGNOSIS — Z951 Presence of aortocoronary bypass graft: Secondary | ICD-10-CM | POA: Insufficient documentation

## 2019-12-12 DIAGNOSIS — I130 Hypertensive heart and chronic kidney disease with heart failure and stage 1 through stage 4 chronic kidney disease, or unspecified chronic kidney disease: Secondary | ICD-10-CM | POA: Insufficient documentation

## 2019-12-12 DIAGNOSIS — I2510 Atherosclerotic heart disease of native coronary artery without angina pectoris: Secondary | ICD-10-CM | POA: Insufficient documentation

## 2019-12-12 DIAGNOSIS — Z79899 Other long term (current) drug therapy: Secondary | ICD-10-CM | POA: Insufficient documentation

## 2019-12-12 DIAGNOSIS — R634 Abnormal weight loss: Secondary | ICD-10-CM | POA: Insufficient documentation

## 2019-12-12 DIAGNOSIS — C9000 Multiple myeloma not having achieved remission: Secondary | ICD-10-CM

## 2019-12-12 DIAGNOSIS — E1151 Type 2 diabetes mellitus with diabetic peripheral angiopathy without gangrene: Secondary | ICD-10-CM | POA: Insufficient documentation

## 2019-12-12 DIAGNOSIS — M25562 Pain in left knee: Secondary | ICD-10-CM | POA: Insufficient documentation

## 2019-12-12 DIAGNOSIS — Z7189 Other specified counseling: Secondary | ICD-10-CM

## 2019-12-12 DIAGNOSIS — I509 Heart failure, unspecified: Secondary | ICD-10-CM | POA: Insufficient documentation

## 2019-12-12 DIAGNOSIS — E785 Hyperlipidemia, unspecified: Secondary | ICD-10-CM | POA: Insufficient documentation

## 2019-12-12 DIAGNOSIS — M1712 Unilateral primary osteoarthritis, left knee: Secondary | ICD-10-CM | POA: Insufficient documentation

## 2019-12-12 DIAGNOSIS — I739 Peripheral vascular disease, unspecified: Secondary | ICD-10-CM | POA: Insufficient documentation

## 2019-12-12 LAB — CBC WITH DIFFERENTIAL (CANCER CENTER ONLY)
Abs Immature Granulocytes: 0.02 10*3/uL (ref 0.00–0.07)
Basophils Absolute: 0 10*3/uL (ref 0.0–0.1)
Basophils Relative: 0 %
Eosinophils Absolute: 0 10*3/uL (ref 0.0–0.5)
Eosinophils Relative: 1 %
HCT: 30.5 % — ABNORMAL LOW (ref 39.0–52.0)
Hemoglobin: 9.8 g/dL — ABNORMAL LOW (ref 13.0–17.0)
Immature Granulocytes: 1 %
Lymphocytes Relative: 20 %
Lymphs Abs: 0.8 10*3/uL (ref 0.7–4.0)
MCH: 27.5 pg (ref 26.0–34.0)
MCHC: 32.1 g/dL (ref 30.0–36.0)
MCV: 85.4 fL (ref 80.0–100.0)
Monocytes Absolute: 0.5 10*3/uL (ref 0.1–1.0)
Monocytes Relative: 11 %
Neutro Abs: 2.9 10*3/uL (ref 1.7–7.7)
Neutrophils Relative %: 67 %
Platelet Count: 338 10*3/uL (ref 150–400)
RBC: 3.57 MIL/uL — ABNORMAL LOW (ref 4.22–5.81)
RDW: 14.4 % (ref 11.5–15.5)
WBC Count: 4.3 10*3/uL (ref 4.0–10.5)
nRBC: 0 % (ref 0.0–0.2)

## 2019-12-12 LAB — CMP (CANCER CENTER ONLY)
ALT: 13 U/L (ref 0–44)
AST: 9 U/L — ABNORMAL LOW (ref 15–41)
Albumin: 2.7 g/dL — ABNORMAL LOW (ref 3.5–5.0)
Alkaline Phosphatase: 91 U/L (ref 38–126)
Anion gap: 10 (ref 5–15)
BUN: 15 mg/dL (ref 8–23)
CO2: 22 mmol/L (ref 22–32)
Calcium: 8.8 mg/dL — ABNORMAL LOW (ref 8.9–10.3)
Chloride: 109 mmol/L (ref 98–111)
Creatinine: 1.48 mg/dL — ABNORMAL HIGH (ref 0.61–1.24)
GFR, Est AFR Am: 57 mL/min — ABNORMAL LOW (ref >60)
GFR, Estimated: 49 mL/min — ABNORMAL LOW (ref >60)
Glucose, Bld: 126 mg/dL — ABNORMAL HIGH (ref 70–99)
Potassium: 3.3 mmol/L — ABNORMAL LOW (ref 3.5–5.1)
Sodium: 141 mmol/L (ref 135–145)
Total Bilirubin: 0.3 mg/dL (ref 0.3–1.2)
Total Protein: 7.5 g/dL (ref 6.5–8.1)

## 2019-12-12 MED ORDER — MONTELUKAST SODIUM 10 MG PO TABS
10.0000 mg | ORAL_TABLET | Freq: Once | ORAL | Status: AC
  Administered 2019-12-12: 10 mg via ORAL

## 2019-12-12 MED ORDER — MEPERIDINE HCL 25 MG/ML IJ SOLN
25.0000 mg | Freq: Once | INTRAMUSCULAR | Status: AC
  Administered 2019-12-12: 25 mg via INTRAVENOUS

## 2019-12-12 MED ORDER — MEPERIDINE HCL 25 MG/ML IJ SOLN
INTRAMUSCULAR | Status: AC
  Filled 2019-12-12: qty 1

## 2019-12-12 MED ORDER — MONTELUKAST SODIUM 10 MG PO TABS
ORAL_TABLET | ORAL | Status: AC
  Filled 2019-12-12: qty 1

## 2019-12-12 MED ORDER — SODIUM CHLORIDE 0.9 % IV SOLN
16.0000 mg/kg | Freq: Once | INTRAVENOUS | Status: AC
  Administered 2019-12-12: 1300 mg via INTRAVENOUS
  Filled 2019-12-12: qty 60

## 2019-12-12 MED ORDER — ACETAMINOPHEN 325 MG PO TABS
650.0000 mg | ORAL_TABLET | Freq: Once | ORAL | Status: AC
  Administered 2019-12-12: 650 mg via ORAL

## 2019-12-12 MED ORDER — SODIUM CHLORIDE 0.9 % IV SOLN
40.0000 mg | Freq: Once | INTRAVENOUS | Status: AC
  Administered 2019-12-12: 40 mg via INTRAVENOUS
  Filled 2019-12-12: qty 4

## 2019-12-12 MED ORDER — DIPHENHYDRAMINE HCL 25 MG PO CAPS
ORAL_CAPSULE | ORAL | Status: AC
  Filled 2019-12-12: qty 2

## 2019-12-12 MED ORDER — SODIUM CHLORIDE 0.9 % IV SOLN
40.0000 mg | Freq: Once | INTRAVENOUS | Status: DC
  Filled 2019-12-12: qty 4

## 2019-12-12 MED ORDER — ACETAMINOPHEN 325 MG PO TABS
ORAL_TABLET | ORAL | Status: AC
  Filled 2019-12-12: qty 2

## 2019-12-12 MED ORDER — DIPHENHYDRAMINE HCL 25 MG PO CAPS
50.0000 mg | ORAL_CAPSULE | Freq: Once | ORAL | Status: AC
  Administered 2019-12-12: 50 mg via ORAL

## 2019-12-12 MED ORDER — METHYLPREDNISOLONE SODIUM SUCC 125 MG IJ SOLR
100.0000 mg | Freq: Once | INTRAMUSCULAR | Status: AC
  Administered 2019-12-12: 100 mg via INTRAVENOUS

## 2019-12-12 MED ORDER — SODIUM CHLORIDE 0.9 % IV SOLN
Freq: Once | INTRAVENOUS | Status: AC
  Filled 2019-12-12: qty 250

## 2019-12-12 MED ORDER — METHYLPREDNISOLONE SODIUM SUCC 125 MG IJ SOLR
INTRAMUSCULAR | Status: AC
  Filled 2019-12-12: qty 2

## 2019-12-12 NOTE — Patient Instructions (Signed)
[facility] Discharge Instructions for Patients Receiving Chemotherapy  Today you received the following chemotherapy agents: Darzalex  To help prevent nausea and vomiting after your treatment, we encourage you to take your nausea medication as directed.    If you develop nausea and vomiting that is not controlled by your nausea medication, call the clinic.   BELOW ARE SYMPTOMS THAT SHOULD BE REPORTED IMMEDIATELY:  *FEVER GREATER THAN 100.5 F  *CHILLS WITH OR WITHOUT FEVER  NAUSEA AND VOMITING THAT IS NOT CONTROLLED WITH YOUR NAUSEA MEDICATION  *UNUSUAL SHORTNESS OF BREATH  *UNUSUAL BRUISING OR BLEEDING  TENDERNESS IN MOUTH AND THROAT WITH OR WITHOUT PRESENCE OF ULCERS  *URINARY PROBLEMS  *BOWEL PROBLEMS  UNUSUAL RASH Items with * indicate a potential emergency and should be followed up as soon as possible.  Feel free to call the clinic should you have any questions or concerns. The clinic phone number is [phone].  Please show the CHEMO ALERT CARD at check-in to the Emergency Department and triage nurse.   

## 2019-12-13 NOTE — Progress Notes (Signed)
CARDIOLOGY OFFICE NOTE  Date:  12/25/2019    [name] Date of Birth: August 05, 1955 Medical Record #[id number]  PCP:  Isaac Bliss, Rayford Halsted, MD  Cardiologist:  Shakaria Raphael & [place]   Chief Complaint  Patient presents with  . Follow-up    History of Present Illness: [name] is a 64 y.o. male who presents today for a 2 month check. Seen for Dr. [place].He typically follows with me.   He has a history ofCAD s/p CABG 2002, HTN,dilated cardiomyopathy, hyperlipidemia andlong standingnoncompliance with follow-up/meds. Echocardiogram in 2009 showed significant improvement in his LV function and EF was normal. Cardiac catheterization in 2006 demonstrated diseasebut he wasnot a candidate for further revascularization. Remote CTA abdomen showed 50% left renal artery stenosis in 2006 as well. Follow up renal duplex was ok.  I have followed him for many years - he tends to go long periods between visits despite our recommendations. He ended up being admitted in May of 2019 with marked HTN and CHF requiring aggressive diuresis. Echo showed EF of 50 to 55% with DD. He has been found to have diabetes - took several visits to convince him to see Endocrine. His medicines always tend to be unclear as to what he is exactly taking. He has missed his follow up with Dr. [name] for his diabetes.   When seen in February - total protein elevated - sent to hematology - concern for MM. Noted significant weight loss. Last visit back in April - he had retired - he had followed thru with getting primary care, seeing hematology, Renal, etc. He noted he "had more time to go to the doctor since he had retired". He was to have bone marrow biopsy with Dr. Irene Limbo. He had not started his Norvasc that I had started at the visit prior - but otherwise, felt to be doing ok.   The patient does not have symptoms concerning for COVID-19 infection (fever, chills, cough, or new shortness of  breath).   Comes in today. Here alone. He is doing well. Enjoying retirement - replaced their kitchen floor. No chest pain. Breathing is good. He is getting infusions of Darzalex for his multiple myeloma - so far, tolerating ok. Labs from earlier this month noted. Potassium was down a bit. He is not short of breath. No swelling. He notes BP has improved with adding Norvasc.   Past Medical History:  Diagnosis Date  . Arthritis    "left knee" (11/24/2017)  . CAD (coronary artery disease)    CABG 2002  . CHF (congestive heart failure) ([place])   . Chronic renal insufficiency   . Diabetes mellitus without complication ([place])   . Dilated cardiomyopathy ([place])    echo in 2009 showed improvement with a normal EF  . HTN (hypertension)   . Hyperlipemia   . Noncompliance   . PAD (peripheral artery disease) ([place])     Past Surgical History:  Procedure Laterality Date  . CARDIAC CATHETERIZATION  2002, 2006  . CORONARY ARTERY BYPASS GRAFT  2002   x4 DR. VAN TRIGT  . IR FLUORO GUIDED NEEDLE PLC ASPIRATION/INJECTION LOC  10/24/2019     Medications: Current Meds  Medication Sig  . Accu-Chek Softclix Lancets lancets Use Accu Chek softclix lancets to check blood sugar fasting or 2 hours after a meal every other day.  [name] acyclovir (ZOVIRAX) 400 MG tablet Take 0.5 tablets (200 mg total) by mouth 2 (two) times daily.  [name] amLODipine (NORVASC) 5 MG tablet  Take 1 tablet (5 mg total) by mouth daily.  [name] aspirin 325 MG EC tablet Take 81 mg by mouth daily.    [name] atorvastatin (LIPITOR) 80 MG tablet Take 1 tablet (80 mg total) by mouth daily.  . Blood Glucose Monitoring Suppl (ACCU-CHEK GUIDE ME) w/Device KIT 1 each by Does not apply route.  . carvedilol (COREG) 25 MG tablet Take 1 tablet (25 mg total) by mouth 2 (two) times daily with a meal.  . dexamethasone (DECADRON) 4 MG tablet 3 tabs ('12mg'$ ) orally with breakfast the day after each Daratumumab treatment.  [name] glucose blood (ACCU-CHEK GUIDE) test strip Use Accu  Chek test strips as instructed to check blood sugar fasting or two hours after meals, every other day.  . hydrALAZINE (APRESOLINE) 50 MG tablet TAKE 1 TABLET BY MOUTH THREE TIMES DAILY  . ibuprofen (ADVIL,MOTRIN) 200 MG tablet Take 200 mg by mouth every 6 (six) hours as needed for moderate pain (for knee).  . isosorbide mononitrate (IMDUR) 30 MG 24 hr tablet Take 1 tablet (30 mg total) by mouth daily.  [name] losartan (COZAAR) 100 MG tablet Take 1 tablet by mouth once daily  . ondansetron (ZOFRAN) 8 MG tablet Take 1 tablet (8 mg total) by mouth 2 (two) times daily as needed (Nausea or vomiting).  . prochlorperazine (COMPAZINE) 10 MG tablet Take 1 tablet (10 mg total) by mouth every 6 (six) hours as needed (Nausea or vomiting).     Allergies: No Known Allergies  Social History: The patient  reports that he has never smoked. He has never used smokeless tobacco. He reports that he does not drink alcohol and does not use drugs.   Family History: The patient's family history includes Diabetes in his mother; Hypertension in his father.   Review of Systems: Please see the history of present illness.   All other systems are reviewed and negative.   Physical Exam: VS:  BP 130/64   Pulse 73   Ht 5' 9.5" (1.765 m)   Wt 172 lb 1.9 oz (78.1 kg)   SpO2 98%   BMI 25.05 kg/m  .  BMI Body mass index is 25.05 kg/m.  Wt Readings from Last 3 Encounters:  12/25/19 172 lb 1.9 oz (78.1 kg)  12/18/19 177 lb (80.3 kg)  12/04/19 178 lb 9.6 oz (81 kg)   BP by me is 140/70.   General: Alert and in no acute distress.   Cardiac: Regular rate and rhythm. No murmurs, rubs, or gallops. No edema.  Respiratory:  Lungs are clear to auscultation bilaterally with normal work of breathing.  GI: Soft and nontender.  MS: No deformity or atrophy. Gait and ROM intact.  Skin: Warm and dry. Color is normal.  Neuro:  Strength and sensation are intact and no gross focal deficits noted.  Psych: Alert, appropriate and with  normal affect.   LABORATORY DATA:  EKG:  EKG is not ordered today.    Lab Results  Component Value Date   WBC 6.2 12/18/2019   HGB 9.8 (L) 12/18/2019   HCT 30.7 (L) 12/18/2019   PLT 341 12/18/2019   GLUCOSE 202 (H) 12/20/2019   CHOL 92 (L) 09/04/2019   TRIG 89 09/04/2019   HDL 35 (L) 09/04/2019   LDLDIRECT 149.4 01/07/2011   LDLCALC 39 09/04/2019   ALT 11 12/18/2019   AST 6 (L) 12/18/2019   NA 140 12/18/2019   K 3.3 (L) 12/18/2019   CL 107 12/18/2019   CREATININE 1.61 (H) 12/18/2019  BUN 20 12/18/2019   CO2 22 12/18/2019   TSH 0.592 09/04/2019   INR 1.2 10/24/2019   HGBA1C 8.0 (H) 12/20/2019   MICROALBUR 12.6 (H) 07/21/2018     BNP (last 3 results) No results for input(s): BNP in the last 8760 hours.  ProBNP (last 3 results) No results for input(s): PROBNP in the last 8760 hours.   Other Studies Reviewed Today:  Echo 11/25/2017 LV EF: 50% - 55% Study Conclusions  - Left ventricle: The cavity size was normal. There was moderate concentric hypertrophy. Systolic function was normal. The estimated ejection fraction was in the range of 50% to 55%. Wall motion was normal; there were no regional wall motion abnormalities. Doppler parameters are consistent with abnormal left ventricular relaxation (grade 1 diastolic dysfunction). Doppler parameters are consistent with high ventricular filling pressure. - Aortic valve: Transvalvular velocity was within the normal range. There was no stenosis. There was no regurgitation. - Mitral valve: Transvalvular velocity was within the normal range. There was no evidence for stenosis. There was no regurgitation. - Left atrium: The atrium was moderately dilated. - Right ventricle: The cavity size was normal. Wall thickness was normal. Systolic function was normal. - Right atrium: The atrium was severely dilated. - Atrial septum: No defect or patent foramen ovale was identified by color flow  Doppler. - Tricuspid valve: There was trivial regurgitation. - Pulmonary arteries: Systolic pressure was within the normal range. PA peak pressure: 36 mm Hg (S).   Assessment & Plan:  1. Ischemic heart disease - managed with medical therapy and CV risk factor modification - no worrisome symptoms - ok to cut aspirin to 81 mg. Doing well from our standpoint.   2. Multiple Myeloma - now on treatment per Dr. Irene Limbo.   3. HTN - on multiple agents - have added back Norvasc with good results - BP has improved considerably.   4. Chronic diastolic HF - weight is down. Off Lasix.   5. CKD - followed by Nephrology.   6. DM - followed by Endocrine.   7. HLD - on statin therapy - would continue.   8. Long standing non compliance - seems that this issue has resolved now that he has resolved.   9. COVID-19 Education: The signs and symptoms of COVID-19 were discussed with the patient and how to seek care for testing (follow up with PCP or arrange E-visit).  The importance of social distancing, staying at home, hand hygiene and wearing a mask when out in public were discussed today.  Current medicines are reviewed with the patient today.  The patient does not have concerns regarding medicines other than what has been noted above.  The following changes have been made:  See above.  Labs/ tests ordered today include:    Orders Placed This Encounter  Procedures  . Basic metabolic panel     Disposition:   FU with me in 4 months.   Patient is agreeable to this plan and will call if any problems develop in the interim.   SignedTruitt Merle, NP  12/25/2019 12:18 PM  [facility] [address] [place], [place]  [address] Phone: [phone] Fax: [phone]

## 2019-12-18 ENCOUNTER — Inpatient Hospital Stay: Payer: No Typology Code available for payment source

## 2019-12-18 ENCOUNTER — Other Ambulatory Visit: Payer: Self-pay

## 2019-12-18 VITALS — BP 147/74 | HR 67 | Temp 98.6°F | Resp 18 | Wt 177.0 lb

## 2019-12-18 DIAGNOSIS — C9000 Multiple myeloma not having achieved remission: Secondary | ICD-10-CM

## 2019-12-18 DIAGNOSIS — D649 Anemia, unspecified: Secondary | ICD-10-CM

## 2019-12-18 DIAGNOSIS — Z7189 Other specified counseling: Secondary | ICD-10-CM

## 2019-12-18 LAB — CMP (CANCER CENTER ONLY)
ALT: 11 U/L (ref 0–44)
AST: 6 U/L — ABNORMAL LOW (ref 15–41)
Albumin: 2.7 g/dL — ABNORMAL LOW (ref 3.5–5.0)
Alkaline Phosphatase: 106 U/L (ref 38–126)
Anion gap: 11 (ref 5–15)
BUN: 20 mg/dL (ref 8–23)
CO2: 22 mmol/L (ref 22–32)
Calcium: 8.3 mg/dL — ABNORMAL LOW (ref 8.9–10.3)
Chloride: 107 mmol/L (ref 98–111)
Creatinine: 1.61 mg/dL — ABNORMAL HIGH (ref 0.61–1.24)
GFR, Est AFR Am: 52 mL/min — ABNORMAL LOW (ref >60)
GFR, Estimated: 45 mL/min — ABNORMAL LOW (ref >60)
Glucose, Bld: 241 mg/dL — ABNORMAL HIGH (ref 70–99)
Potassium: 3.3 mmol/L — ABNORMAL LOW (ref 3.5–5.1)
Sodium: 140 mmol/L (ref 135–145)
Total Bilirubin: 0.3 mg/dL (ref 0.3–1.2)
Total Protein: 6.8 g/dL (ref 6.5–8.1)

## 2019-12-18 LAB — CBC WITH DIFFERENTIAL/PLATELET
Abs Immature Granulocytes: 0.03 10*3/uL (ref 0.00–0.07)
Basophils Absolute: 0 10*3/uL (ref 0.0–0.1)
Basophils Relative: 0 %
Eosinophils Absolute: 0 10*3/uL (ref 0.0–0.5)
Eosinophils Relative: 0 %
HCT: 30.7 % — ABNORMAL LOW (ref 39.0–52.0)
Hemoglobin: 9.8 g/dL — ABNORMAL LOW (ref 13.0–17.0)
Immature Granulocytes: 1 %
Lymphocytes Relative: 16 %
Lymphs Abs: 1 10*3/uL (ref 0.7–4.0)
MCH: 27 pg (ref 26.0–34.0)
MCHC: 31.9 g/dL (ref 30.0–36.0)
MCV: 84.6 fL (ref 80.0–100.0)
Monocytes Absolute: 0.6 10*3/uL (ref 0.1–1.0)
Monocytes Relative: 10 %
Neutro Abs: 4.6 10*3/uL (ref 1.7–7.7)
Neutrophils Relative %: 73 %
Platelets: 341 10*3/uL (ref 150–400)
RBC: 3.63 MIL/uL — ABNORMAL LOW (ref 4.22–5.81)
RDW: 14.4 % (ref 11.5–15.5)
WBC: 6.2 10*3/uL (ref 4.0–10.5)
nRBC: 0 % (ref 0.0–0.2)

## 2019-12-18 MED ORDER — SODIUM CHLORIDE 0.9 % IV SOLN
16.0000 mg/kg | Freq: Once | INTRAVENOUS | Status: AC
  Administered 2019-12-18: 1300 mg via INTRAVENOUS
  Filled 2019-12-18: qty 5

## 2019-12-18 MED ORDER — SODIUM CHLORIDE 0.9 % IV SOLN
40.0000 mg | Freq: Once | INTRAVENOUS | Status: AC
  Administered 2019-12-18: 40 mg via INTRAVENOUS
  Filled 2019-12-18: qty 4

## 2019-12-18 MED ORDER — MEPERIDINE HCL 25 MG/ML IJ SOLN
INTRAMUSCULAR | Status: AC
  Filled 2019-12-18: qty 1

## 2019-12-18 MED ORDER — METHYLPREDNISOLONE SODIUM SUCC 125 MG IJ SOLR
INTRAMUSCULAR | Status: AC
  Filled 2019-12-18: qty 2

## 2019-12-18 MED ORDER — ACETAMINOPHEN 325 MG PO TABS
650.0000 mg | ORAL_TABLET | Freq: Once | ORAL | Status: AC
  Administered 2019-12-18: 650 mg via ORAL

## 2019-12-18 MED ORDER — ACETAMINOPHEN 325 MG PO TABS
ORAL_TABLET | ORAL | Status: AC
  Filled 2019-12-18: qty 2

## 2019-12-18 MED ORDER — MEPERIDINE HCL 25 MG/ML IJ SOLN
25.0000 mg | Freq: Once | INTRAMUSCULAR | Status: AC
  Administered 2019-12-18: 25 mg via INTRAVENOUS

## 2019-12-18 MED ORDER — MONTELUKAST SODIUM 10 MG PO TABS
10.0000 mg | ORAL_TABLET | Freq: Once | ORAL | Status: AC
  Administered 2019-12-18: 10 mg via ORAL

## 2019-12-18 MED ORDER — DIPHENHYDRAMINE HCL 25 MG PO CAPS
ORAL_CAPSULE | ORAL | Status: AC
  Filled 2019-12-18: qty 2

## 2019-12-18 MED ORDER — MONTELUKAST SODIUM 10 MG PO TABS
ORAL_TABLET | ORAL | Status: AC
  Filled 2019-12-18: qty 1

## 2019-12-18 MED ORDER — SODIUM CHLORIDE 0.9 % IV SOLN
Freq: Once | INTRAVENOUS | Status: AC
  Filled 2019-12-18: qty 250

## 2019-12-18 MED ORDER — METHYLPREDNISOLONE SODIUM SUCC 125 MG IJ SOLR
100.0000 mg | Freq: Once | INTRAMUSCULAR | Status: AC
  Administered 2019-12-18: 100 mg via INTRAVENOUS

## 2019-12-18 MED ORDER — DIPHENHYDRAMINE HCL 25 MG PO CAPS
50.0000 mg | ORAL_CAPSULE | Freq: Once | ORAL | Status: AC
  Administered 2019-12-18: 50 mg via ORAL

## 2019-12-18 MED ORDER — DIPHENHYDRAMINE HCL 25 MG PO CAPS
ORAL_CAPSULE | ORAL | Status: AC
  Filled 2019-12-18: qty 1

## 2019-12-18 NOTE — Patient Instructions (Signed)
[facility] Discharge Instructions for Patients Receiving Chemotherapy  Today you received the following chemotherapy agents: Darzalex  To help prevent nausea and vomiting after your treatment, we encourage you to take your nausea medication as directed.    If you develop nausea and vomiting that is not controlled by your nausea medication, call the clinic.   BELOW ARE SYMPTOMS THAT SHOULD BE REPORTED IMMEDIATELY:  *FEVER GREATER THAN 100.5 F  *CHILLS WITH OR WITHOUT FEVER  NAUSEA AND VOMITING THAT IS NOT CONTROLLED WITH YOUR NAUSEA MEDICATION  *UNUSUAL SHORTNESS OF BREATH  *UNUSUAL BRUISING OR BLEEDING  TENDERNESS IN MOUTH AND THROAT WITH OR WITHOUT PRESENCE OF ULCERS  *URINARY PROBLEMS  *BOWEL PROBLEMS  UNUSUAL RASH Items with * indicate a potential emergency and should be followed up as soon as possible.  Feel free to call the clinic should you have any questions or concerns. The clinic phone number is [phone].  Please show the CHEMO ALERT CARD at check-in to the Emergency Department and triage nurse.   

## 2019-12-20 ENCOUNTER — Other Ambulatory Visit: Payer: Self-pay

## 2019-12-20 ENCOUNTER — Other Ambulatory Visit (INDEPENDENT_AMBULATORY_CARE_PROVIDER_SITE_OTHER): Payer: No Typology Code available for payment source

## 2019-12-20 DIAGNOSIS — E1165 Type 2 diabetes mellitus with hyperglycemia: Secondary | ICD-10-CM

## 2019-12-20 LAB — HEMOGLOBIN A1C: Hgb A1c MFr Bld: 8 % — ABNORMAL HIGH (ref 4.6–6.5)

## 2019-12-20 LAB — GLUCOSE, RANDOM: Glucose, Bld: 202 mg/dL — ABNORMAL HIGH (ref 70–99)

## 2019-12-25 ENCOUNTER — Ambulatory Visit (INDEPENDENT_AMBULATORY_CARE_PROVIDER_SITE_OTHER): Payer: No Typology Code available for payment source | Admitting: Nurse Practitioner

## 2019-12-25 ENCOUNTER — Encounter: Payer: Self-pay | Admitting: Nurse Practitioner

## 2019-12-25 ENCOUNTER — Other Ambulatory Visit: Payer: Self-pay

## 2019-12-25 VITALS — BP 130/64 | HR 73 | Ht 69.5 in | Wt 172.1 lb

## 2019-12-25 DIAGNOSIS — I259 Chronic ischemic heart disease, unspecified: Secondary | ICD-10-CM

## 2019-12-25 DIAGNOSIS — E876 Hypokalemia: Secondary | ICD-10-CM

## 2019-12-25 DIAGNOSIS — N184 Chronic kidney disease, stage 4 (severe): Secondary | ICD-10-CM | POA: Diagnosis not present

## 2019-12-25 DIAGNOSIS — I5032 Chronic diastolic (congestive) heart failure: Secondary | ICD-10-CM

## 2019-12-25 DIAGNOSIS — I25810 Atherosclerosis of coronary artery bypass graft(s) without angina pectoris: Secondary | ICD-10-CM

## 2019-12-25 DIAGNOSIS — I10 Essential (primary) hypertension: Secondary | ICD-10-CM | POA: Diagnosis not present

## 2019-12-25 LAB — BASIC METABOLIC PANEL
BUN/Creatinine Ratio: 12 (ref 10–24)
BUN: 18 mg/dL (ref 8–27)
CO2: 20 mmol/L (ref 20–29)
Calcium: 8.5 mg/dL — ABNORMAL LOW (ref 8.6–10.2)
Chloride: 104 mmol/L (ref 96–106)
Creatinine, Ser: 1.55 mg/dL — ABNORMAL HIGH (ref 0.76–1.27)
GFR calc Af Amer: 54 mL/min/{1.73_m2} — ABNORMAL LOW (ref >59)
GFR calc non Af Amer: 47 mL/min/{1.73_m2} — ABNORMAL LOW (ref >59)
Glucose: 188 mg/dL — ABNORMAL HIGH (ref 65–99)
Potassium: 3.6 mmol/L (ref 3.5–5.2)
Sodium: 138 mmol/L (ref 134–144)

## 2019-12-25 NOTE — Patient Instructions (Addendum)
After Visit Summary:  We will be checking the following labs today - BMET   Medication Instructions:    Continue with your current medicines.  Ok to switch to just baby aspirin    If you need a refill on your cardiac medications before your next appointment, please call your pharmacy.     Testing/Procedures To Be Arranged:  N/A  Follow-Up:   See me in 4 months    At [facility], you and your health needs are our priority.  As part of our continuing mission to provide you with exceptional heart care, we have created designated Provider Care Teams.  These Care Teams include your primary Cardiologist (physician) and Advanced Practice Providers (APPs -  Physician Assistants and Nurse Practitioners) who all work together to provide you with the care you need, when you need it.  Special Instructions:  . Stay safe, stay home, wash your hands for at least 20 seconds and wear a mask when out in public.  . It was good to talk with you today.    Call the [place] office at [phone] if you have any questions, problems or concerns.

## 2019-12-26 ENCOUNTER — Inpatient Hospital Stay: Payer: No Typology Code available for payment source

## 2019-12-26 ENCOUNTER — Other Ambulatory Visit: Payer: Self-pay

## 2019-12-26 VITALS — BP 125/63 | HR 70 | Temp 98.2°F | Resp 18

## 2019-12-26 DIAGNOSIS — C9000 Multiple myeloma not having achieved remission: Secondary | ICD-10-CM

## 2019-12-26 DIAGNOSIS — Z7189 Other specified counseling: Secondary | ICD-10-CM

## 2019-12-26 DIAGNOSIS — D649 Anemia, unspecified: Secondary | ICD-10-CM

## 2019-12-26 LAB — CMP (CANCER CENTER ONLY)
ALT: 10 U/L (ref 0–44)
AST: 7 U/L — ABNORMAL LOW (ref 15–41)
Albumin: 2.8 g/dL — ABNORMAL LOW (ref 3.5–5.0)
Alkaline Phosphatase: 88 U/L (ref 38–126)
Anion gap: 11 (ref 5–15)
BUN: 18 mg/dL (ref 8–23)
CO2: 22 mmol/L (ref 22–32)
Calcium: 8.6 mg/dL — ABNORMAL LOW (ref 8.9–10.3)
Chloride: 107 mmol/L (ref 98–111)
Creatinine: 1.6 mg/dL — ABNORMAL HIGH (ref 0.61–1.24)
GFR, Est AFR Am: 52 mL/min — ABNORMAL LOW (ref >60)
GFR, Estimated: 45 mL/min — ABNORMAL LOW (ref >60)
Glucose, Bld: 216 mg/dL — ABNORMAL HIGH (ref 70–99)
Potassium: 3.6 mmol/L (ref 3.5–5.1)
Sodium: 140 mmol/L (ref 135–145)
Total Bilirubin: 0.5 mg/dL (ref 0.3–1.2)
Total Protein: 7.1 g/dL (ref 6.5–8.1)

## 2019-12-26 LAB — CBC WITH DIFFERENTIAL/PLATELET
Abs Immature Granulocytes: 0.02 10*3/uL (ref 0.00–0.07)
Basophils Absolute: 0 10*3/uL (ref 0.0–0.1)
Basophils Relative: 0 %
Eosinophils Absolute: 0.1 10*3/uL (ref 0.0–0.5)
Eosinophils Relative: 1 %
HCT: 32.4 % — ABNORMAL LOW (ref 39.0–52.0)
Hemoglobin: 10.4 g/dL — ABNORMAL LOW (ref 13.0–17.0)
Immature Granulocytes: 0 %
Lymphocytes Relative: 17 %
Lymphs Abs: 1.2 10*3/uL (ref 0.7–4.0)
MCH: 27.2 pg (ref 26.0–34.0)
MCHC: 32.1 g/dL (ref 30.0–36.0)
MCV: 84.8 fL (ref 80.0–100.0)
Monocytes Absolute: 0.6 10*3/uL (ref 0.1–1.0)
Monocytes Relative: 8 %
Neutro Abs: 5.3 10*3/uL (ref 1.7–7.7)
Neutrophils Relative %: 74 %
Platelets: 313 10*3/uL (ref 150–400)
RBC: 3.82 MIL/uL — ABNORMAL LOW (ref 4.22–5.81)
RDW: 14 % (ref 11.5–15.5)
WBC: 7.2 10*3/uL (ref 4.0–10.5)
nRBC: 0 % (ref 0.0–0.2)

## 2019-12-26 MED ORDER — SODIUM CHLORIDE 0.9 % IV SOLN
Freq: Once | INTRAVENOUS | Status: AC
  Filled 2019-12-26: qty 250

## 2019-12-26 MED ORDER — DIPHENHYDRAMINE HCL 25 MG PO CAPS
50.0000 mg | ORAL_CAPSULE | Freq: Once | ORAL | Status: AC
  Administered 2019-12-26: 50 mg via ORAL

## 2019-12-26 MED ORDER — METHYLPREDNISOLONE SODIUM SUCC 125 MG IJ SOLR
100.0000 mg | Freq: Once | INTRAMUSCULAR | Status: AC
  Administered 2019-12-26: 100 mg via INTRAVENOUS

## 2019-12-26 MED ORDER — SODIUM CHLORIDE 0.9 % IV SOLN
16.0000 mg/kg | Freq: Once | INTRAVENOUS | Status: AC
  Administered 2019-12-26: 1300 mg via INTRAVENOUS
  Filled 2019-12-26: qty 60

## 2019-12-26 MED ORDER — MEPERIDINE HCL 25 MG/ML IJ SOLN
INTRAMUSCULAR | Status: AC
  Filled 2019-12-26: qty 1

## 2019-12-26 MED ORDER — ACETAMINOPHEN 325 MG PO TABS
ORAL_TABLET | ORAL | Status: AC
  Filled 2019-12-26: qty 2

## 2019-12-26 MED ORDER — SODIUM CHLORIDE 0.9 % IV SOLN
40.0000 mg | Freq: Once | INTRAVENOUS | Status: AC
  Administered 2019-12-26: 40 mg via INTRAVENOUS
  Filled 2019-12-26: qty 4

## 2019-12-26 MED ORDER — MONTELUKAST SODIUM 10 MG PO TABS
ORAL_TABLET | ORAL | Status: AC
  Filled 2019-12-26: qty 1

## 2019-12-26 MED ORDER — MEPERIDINE HCL 25 MG/ML IJ SOLN
25.0000 mg | Freq: Once | INTRAMUSCULAR | Status: AC
  Administered 2019-12-26: 25 mg via INTRAVENOUS

## 2019-12-26 MED ORDER — DIPHENHYDRAMINE HCL 25 MG PO CAPS
ORAL_CAPSULE | ORAL | Status: AC
  Filled 2019-12-26: qty 2

## 2019-12-26 MED ORDER — FAMOTIDINE IN NACL 20-0.9 MG/50ML-% IV SOLN
INTRAVENOUS | Status: AC
  Filled 2019-12-26: qty 50

## 2019-12-26 MED ORDER — ACETAMINOPHEN 325 MG PO TABS
650.0000 mg | ORAL_TABLET | Freq: Once | ORAL | Status: AC
  Administered 2019-12-26: 650 mg via ORAL

## 2019-12-26 MED ORDER — METHYLPREDNISOLONE SODIUM SUCC 125 MG IJ SOLR
INTRAMUSCULAR | Status: AC
  Filled 2019-12-26: qty 2

## 2019-12-26 MED ORDER — MONTELUKAST SODIUM 10 MG PO TABS
10.0000 mg | ORAL_TABLET | Freq: Once | ORAL | Status: AC
  Administered 2019-12-26: 10 mg via ORAL

## 2019-12-26 NOTE — Progress Notes (Signed)
Verbal order - Per Dr. Irene Limbo, ok to receive dara today with Creatinine 1.6

## 2019-12-26 NOTE — Patient Instructions (Signed)
[facility] Discharge Instructions for Patients Receiving Chemotherapy  Today you received the following chemotherapy agents: Darzalex  To help prevent nausea and vomiting after your treatment, we encourage you to take your nausea medication as directed.    If you develop nausea and vomiting that is not controlled by your nausea medication, call the clinic.   BELOW ARE SYMPTOMS THAT SHOULD BE REPORTED IMMEDIATELY:  *FEVER GREATER THAN 100.5 F  *CHILLS WITH OR WITHOUT FEVER  NAUSEA AND VOMITING THAT IS NOT CONTROLLED WITH YOUR NAUSEA MEDICATION  *UNUSUAL SHORTNESS OF BREATH  *UNUSUAL BRUISING OR BLEEDING  TENDERNESS IN MOUTH AND THROAT WITH OR WITHOUT PRESENCE OF ULCERS  *URINARY PROBLEMS  *BOWEL PROBLEMS  UNUSUAL RASH Items with * indicate a potential emergency and should be followed up as soon as possible.  Feel free to call the clinic should you have any questions or concerns. The clinic phone number is [phone].  Please show the CHEMO ALERT CARD at check-in to the Emergency Department and triage nurse.   

## 2019-12-27 ENCOUNTER — Telehealth: Payer: Self-pay | Admitting: Nurse Practitioner

## 2019-12-27 NOTE — Telephone Encounter (Signed)
    Pt is returning call to get lab results 

## 2019-12-28 ENCOUNTER — Other Ambulatory Visit: Payer: Self-pay

## 2019-12-28 ENCOUNTER — Encounter: Payer: Self-pay | Admitting: Endocrinology

## 2019-12-28 ENCOUNTER — Ambulatory Visit (INDEPENDENT_AMBULATORY_CARE_PROVIDER_SITE_OTHER): Payer: No Typology Code available for payment source | Admitting: Endocrinology

## 2019-12-28 VITALS — BP 140/70 | HR 86 | Ht 70.0 in | Wt 175.8 lb

## 2019-12-28 DIAGNOSIS — E1165 Type 2 diabetes mellitus with hyperglycemia: Secondary | ICD-10-CM | POA: Diagnosis not present

## 2019-12-28 MED ORDER — GLIMEPIRIDE 1 MG PO TABS: 1.0000 mg | ORAL_TABLET | Freq: Every day | ORAL | 1 refills | Status: DC

## 2019-12-28 MED ORDER — RYBELSUS 7 MG PO TABS
1.0000 | ORAL_TABLET | Freq: Every day | ORAL | 3 refills | Status: DC
Start: 2019-12-28 — End: 2020-03-12

## 2019-12-28 NOTE — Progress Notes (Signed)
Patient ID: [name], male   DOB: [birth year], 64 y.o.   MRN: [id number]           Reason for Appointment: Follow-up for Type 2 Diabetes   History of Present Illness:          Date of diagnosis of type 2 diabetes mellitus:   2018      Background history:  He has never been told to have diabetes but review of his previous labs indicate that he had high blood sugar 179 in 2014 His highest sugar has been 234 in 10/2016 when his A1c was 8.8, not clear if he was symptomatic at that time He was recommended referral for diabetes at that time but he did not make an appointment  Recent history:   A1c is 8, highest 8.8  Non-insulin hypoglycemic drugs the patient is taking are: None  Current management, blood sugar patterns and problems identified:  He was given a sample of Rybelsus about a month ago and he thinks he is taking it regularly in the morning on empty stomach  However again he has not checked his blood sugars at home, stating that he has error messages on his meter  Recent lab blood sugars have been mostly high ranging between 188 and 216  Still getting steroids, now methylprednisolone 100 mg with his chemotherapy infusions every 2 weeks  He thinks he is avoiding a lot of high-fat foods  He has seen the dietitian previously  Recent weight appears to be stable  Not doing some walking for exercise            Glucose monitoring:  None, has Accu-Chek meter  Dietician visit, most recent: 2/21  Weight history:  Wt Readings from Last 3 Encounters:  12/28/19 175 lb 12.8 oz (79.7 kg)  12/25/19 172 lb 1.9 oz (78.1 kg)  12/18/19 177 lb (80.3 kg)    Glycemic control:   Lab Results  Component Value Date   HGBA1C 8.0 (H) 12/20/2019   HGBA1C 7.8 (H) 09/04/2019   HGBA1C 7.9 (H) 05/23/2019   Lab Results  Component Value Date   MICROALBUR 12.6 (H) 07/21/2018   LDLCALC 39 09/04/2019   CREATININE 1.60 (H) 12/26/2019   Lab Results  Component Value Date    MICRALBCREAT 10.0 07/21/2018    Lab Results  Component Value Date   FRUCTOSAMINE 311 (H) 11/20/2019   FRUCTOSAMINE 322 (H) 03/10/2019    Appointment on 12/26/2019  Component Date Value Ref Range Status   WBC 12/26/2019 7.2  4.0 - 10.5 K/uL Final   RBC 12/26/2019 3.82* 4.22 - 5.81 MIL/uL Final   Hemoglobin 12/26/2019 10.4* 13.0 - 17.0 g/dL Final   HCT 12/26/2019 32.4* 39 - 52 % Final   MCV 12/26/2019 84.8  80.0 - 100.0 fL Final   MCH 12/26/2019 27.2  26.0 - 34.0 pg Final   MCHC 12/26/2019 32.1  30.0 - 36.0 g/dL Final   RDW 12/26/2019 14.0  11.5 - 15.5 % Final   Platelets 12/26/2019 313  150 - 400 K/uL Final   nRBC 12/26/2019 0.0  0.0 - 0.2 % Final   Neutrophils Relative % 12/26/2019 74  % Final   Neutro Abs 12/26/2019 5.3  1.7 - 7.7 K/uL Final   Lymphocytes Relative 12/26/2019 17  % Final   Lymphs Abs 12/26/2019 1.2  0.7 - 4.0 K/uL Final   Monocytes Relative 12/26/2019 8  % Final   Monocytes Absolute 12/26/2019 0.6  0 - 1 K/uL Final  Eosinophils Relative 12/26/2019 1  % Final   Eosinophils Absolute 12/26/2019 0.1  0 - 0 K/uL Final   Basophils Relative 12/26/2019 0  % Final   Basophils Absolute 12/26/2019 0.0  0 - 0 K/uL Final   Immature Granulocytes 12/26/2019 0  % Final   Abs Immature Granulocytes 12/26/2019 0.02  0.00 - 0.07 K/uL Final   Performed at [facility], [place] [address], [place], [place] [address]   Sodium 12/26/2019 140  135 - 145 mmol/L Final   Potassium 12/26/2019 3.6  3.5 - 5.1 mmol/L Final   Chloride 12/26/2019 107  98 - 111 mmol/L Final   CO2 12/26/2019 22  22 - 32 mmol/L Final   Glucose, Bld 12/26/2019 216* 70 - 99 mg/dL Final   Glucose reference range applies only to samples taken after fasting for at least 8 hours.   BUN 12/26/2019 18  8 - 23 mg/dL Final   Creatinine 12/26/2019 1.60* 0.61 - 1.24 mg/dL Final   Calcium 12/26/2019 8.6* 8.9 - 10.3 mg/dL Final   Total Protein 12/26/2019 7.1  6.5 - 8.1  g/dL Final   Albumin 12/26/2019 2.8* 3.5 - 5.0 g/dL Final   AST 12/26/2019 7* 15 - 41 U/L Final   ALT 12/26/2019 10  0 - 44 U/L Final   Alkaline Phosphatase 12/26/2019 88  38 - 126 U/L Final   Total Bilirubin 12/26/2019 0.5  0.3 - 1.2 mg/dL Final   GFR, Est Non Af Am 12/26/2019 45* >60 mL/min Final   GFR, Est AFR Am 12/26/2019 52* >60 mL/min Final   Anion gap 12/26/2019 11  5 - 15 Final   Performed at [facility], [place] [address] [place], [place] [address]  Office Visit on 12/25/2019  Component Date Value Ref Range Status   Glucose 12/25/2019 188* 65 - 99 mg/dL Final   BUN 12/25/2019 18  8 - 27 mg/dL Final   Creatinine, Ser 12/25/2019 1.55* 0.76 - 1.27 mg/dL Final   GFR calc non Af Amer 12/25/2019 47* >59 mL/min/1.73 Final   GFR calc Af Amer 12/25/2019 54* >59 mL/min/1.73 Final   Comment: **[facility] currently reports eGFR in compliance with the current**   recommendations of the [employer]. [facility] will   update reporting as new guidelines are published from the NKF-ASN   Task force.    BUN/Creatinine Ratio 12/25/2019 12  10 - 24 Final   Sodium 12/25/2019 138  134 - 144 mmol/L Final   Potassium 12/25/2019 3.6  3.5 - 5.2 mmol/L Final   Chloride 12/25/2019 104  96 - 106 mmol/L Final   CO2 12/25/2019 20  20 - 29 mmol/L Final   Calcium 12/25/2019 8.5* 8.6 - 10.2 mg/dL Final    Allergies as of 12/28/2019   No Known Allergies     Medication List       Accurate as of December 28, 2019  3:56 PM. If you have any questions, ask your nurse or doctor.        Accu-Chek Guide Me w/Device Kit 1 each by Does not apply route.   Accu-Chek Guide test strip Generic drug: glucose blood Use Accu Chek test strips as instructed to check blood sugar fasting or two hours after meals, every other day.   Accu-Chek Softclix Lancets lancets Use Accu Chek softclix lancets to check blood sugar fasting or 2 hours after a meal every other day.     acyclovir 400 MG tablet Commonly known as: ZOVIRAX Take  0.5 tablets (200 mg total) by mouth 2 (two) times daily.   amLODipine 5 MG tablet Commonly known as: NORVASC Take 1 tablet (5 mg total) by mouth daily.   aspirin 325 MG EC tablet Take 81 mg by mouth daily.   atorvastatin 80 MG tablet Commonly known as: LIPITOR Take 1 tablet (80 mg total) by mouth daily.   carvedilol 25 MG tablet Commonly known as: COREG Take 1 tablet (25 mg total) by mouth 2 (two) times daily with a meal.   dexamethasone 4 MG tablet Commonly known as: DECADRON 3 tabs ('12mg'$ ) orally with breakfast the day after each Daratumumab treatment.   hydrALAZINE 50 MG tablet Commonly known as: APRESOLINE TAKE 1 TABLET BY MOUTH THREE TIMES DAILY   ibuprofen 200 MG tablet Commonly known as: ADVIL Take 200 mg by mouth every 6 (six) hours as needed for moderate pain (for knee).   isosorbide mononitrate 30 MG 24 hr tablet Commonly known as: IMDUR Take 1 tablet (30 mg total) by mouth daily.   losartan 100 MG tablet Commonly known as: COZAAR Take 1 tablet by mouth once daily   ondansetron 8 MG tablet Commonly known as: Zofran Take 1 tablet (8 mg total) by mouth 2 (two) times daily as needed (Nausea or vomiting).   prochlorperazine 10 MG tablet Commonly known as: COMPAZINE Take 1 tablet (10 mg total) by mouth every 6 (six) hours as needed (Nausea or vomiting).   Rybelsus 3 MG Tabs Generic drug: Semaglutide Take 3 mg by mouth daily.       Allergies: No Known Allergies  Past Medical History:  Diagnosis Date   Arthritis    "left knee" (11/24/2017)   CAD (coronary artery disease)    CABG 2002   CHF (congestive heart failure) (HCC)    Chronic renal insufficiency    Diabetes mellitus without complication ([facility])    Dilated cardiomyopathy ([facility])    echo in 2009 showed improvement with a normal EF   HTN (hypertension)    Hyperlipemia    Noncompliance    PAD (peripheral artery disease) ([place])      Past Surgical History:  Procedure Laterality Date   CARDIAC CATHETERIZATION  2002, 2006   CORONARY ARTERY BYPASS GRAFT  2002   x4 DR. VAN TRIGT   IR FLUORO GUIDED NEEDLE PLC ASPIRATION/INJECTION LOC  10/24/2019    Family History  Problem Relation Age of Onset   Hypertension Father    Diabetes Mother        DIABETIC COMA    Social History:  reports that he has never smoked. He has never used smokeless tobacco. He reports that he does not drink alcohol and does not use drugs.   Review of Systems   Lipid history: On atorvastatin 80 mg since diagnosis of CAD, labs as follows    Lab Results  Component Value Date   CHOL 92 (L) 09/04/2019   HDL 35 (L) 09/04/2019   LDLCALC 39 09/04/2019   LDLDIRECT 149.4 01/07/2011   TRIG 89 09/04/2019   CHOLHDL 2.6 09/04/2019           Hypertension: Has been followed by cardiologist for blood pressure control Recent readings  BP Readings from Last 3 Encounters:  12/28/19 140/70  12/26/19 125/63  12/25/19 130/64   RENAL dysfunction is stable Needs urine microalbumin checked  Lab Results  Component Value Date   CREATININE 1.60 (H) 12/26/2019   CREATININE 1.55 (H) 12/25/2019   CREATININE 1.61 (H) 12/18/2019    Most recent  eye exam was in 2018  Most recent foot exam: 07/2018  Currently known complications of diabetes:none  LABS:  Appointment on 12/26/2019  Component Date Value Ref Range Status   WBC 12/26/2019 7.2  4.0 - 10.5 K/uL Final   RBC 12/26/2019 3.82* 4.22 - 5.81 MIL/uL Final   Hemoglobin 12/26/2019 10.4* 13.0 - 17.0 g/dL Final   HCT 12/26/2019 32.4* 39 - 52 % Final   MCV 12/26/2019 84.8  80.0 - 100.0 fL Final   MCH 12/26/2019 27.2  26.0 - 34.0 pg Final   MCHC 12/26/2019 32.1  30.0 - 36.0 g/dL Final   RDW 12/26/2019 14.0  11.5 - 15.5 % Final   Platelets 12/26/2019 313  150 - 400 K/uL Final   nRBC 12/26/2019 0.0  0.0 - 0.2 % Final   Neutrophils Relative % 12/26/2019 74  % Final   Neutro Abs  12/26/2019 5.3  1.7 - 7.7 K/uL Final   Lymphocytes Relative 12/26/2019 17  % Final   Lymphs Abs 12/26/2019 1.2  0.7 - 4.0 K/uL Final   Monocytes Relative 12/26/2019 8  % Final   Monocytes Absolute 12/26/2019 0.6  0 - 1 K/uL Final   Eosinophils Relative 12/26/2019 1  % Final   Eosinophils Absolute 12/26/2019 0.1  0 - 0 K/uL Final   Basophils Relative 12/26/2019 0  % Final   Basophils Absolute 12/26/2019 0.0  0 - 0 K/uL Final   Immature Granulocytes 12/26/2019 0  % Final   Abs Immature Granulocytes 12/26/2019 0.02  0.00 - 0.07 K/uL Final   Performed at [facility], [place] [address] [place], [place] [address]   Sodium 12/26/2019 140  135 - 145 mmol/L Final   Potassium 12/26/2019 3.6  3.5 - 5.1 mmol/L Final   Chloride 12/26/2019 107  98 - 111 mmol/L Final   CO2 12/26/2019 22  22 - 32 mmol/L Final   Glucose, Bld 12/26/2019 216* 70 - 99 mg/dL Final   Glucose reference range applies only to samples taken after fasting for at least 8 hours.   BUN 12/26/2019 18  8 - 23 mg/dL Final   Creatinine 12/26/2019 1.60* 0.61 - 1.24 mg/dL Final   Calcium 12/26/2019 8.6* 8.9 - 10.3 mg/dL Final   Total Protein 12/26/2019 7.1  6.5 - 8.1 g/dL Final   Albumin 12/26/2019 2.8* 3.5 - 5.0 g/dL Final   AST 12/26/2019 7* 15 - 41 U/L Final   ALT 12/26/2019 10  0 - 44 U/L Final   Alkaline Phosphatase 12/26/2019 88  38 - 126 U/L Final   Total Bilirubin 12/26/2019 0.5  0.3 - 1.2 mg/dL Final   GFR, Est Non Af Am 12/26/2019 45* >60 mL/min Final   GFR, Est AFR Am 12/26/2019 52* >60 mL/min Final   Anion gap 12/26/2019 11  5 - 15 Final   Performed at [facility], [place] [address], [place], [place] [address]  Office Visit on 12/25/2019  Component Date Value Ref Range Status   Glucose 12/25/2019 188* 65 - 99 mg/dL Final   BUN 12/25/2019 18  8 - 27 mg/dL Final   Creatinine, Ser 12/25/2019 1.55* 0.76 - 1.27 mg/dL Final   GFR calc non Af Amer  12/25/2019 47* >59 mL/min/1.73 Final   GFR calc Af Amer 12/25/2019 54* >59 mL/min/1.73 Final   Comment: **[facility] currently reports eGFR in compliance with the current**   recommendations of the [employer]. [facility] will   update reporting as new guidelines are  published from the NKF-ASN   Task force.    BUN/Creatinine Ratio 12/25/2019 12  10 - 24 Final   Sodium 12/25/2019 138  134 - 144 mmol/L Final   Potassium 12/25/2019 3.6  3.5 - 5.2 mmol/L Final   Chloride 12/25/2019 104  96 - 106 mmol/L Final   CO2 12/25/2019 20  20 - 29 mmol/L Final   Calcium 12/25/2019 8.5* 8.6 - 10.2 mg/dL Final    Physical Examination:  BP 140/70 (BP Location: Left Arm, Patient Position: Sitting, Cuff Size: Normal)    Pulse 86    Ht '5\' 10"'$  (1.778 m)    Wt 175 lb 12.8 oz (79.7 kg)    SpO2 98%    BMI 25.22 kg/m          ASSESSMENT:  Diabetes type 2, mild  See history of present illness for detailed discussion of current diabetes management, blood sugar patterns and problems identified  He has had some worsening of his diabetes control with recent blood sugars about 180-200+ including after meals A1c last 7.8 and fructosamine is 311  Currently still not on any treatment, he did not pick up the Januvia prescription because of cost At least short-term he will tend to have higher readings because of taking weekly high-dose Decadron for his chemotherapy  He has difficulty understanding the diet and sometimes still has high fat meals Recently appears to be gaining weight  PLAN:    1. Glucose monitoring: He will call the Accu-Chek manufacturer to get help with his error messages on the meter 2. Again reminded him to check some readings fasting and some after meals  2.    Medication recommendations He will continue RYBELSUS and will be given a prescription for 7 mg daily He will let us know if he has any nausea with this Also he can let us know if he has difficulty affording this  and may use patient assistance program Also start Amaryl 1 mg daily and may need to increase this further   3.  Encouraged him to stay away from high fat and high carbohydrate meals Regular walking for exercise  Follow-up in 6 weeks  There are no Patient Instructions on file for this visit.     Elayne Snare 12/28/2019, 3:56 PM   Note: This office note was prepared with Dragon voice recognition system technology. Any transcriptional errors that result from this process are unintentional.   Elayne Snare

## 2019-12-28 NOTE — Telephone Encounter (Signed)
S/w pt is aware of lab results and that lab results were left on vm per (DPR).

## 2020-01-01 ENCOUNTER — Inpatient Hospital Stay: Payer: No Typology Code available for payment source

## 2020-01-01 ENCOUNTER — Inpatient Hospital Stay (HOSPITAL_BASED_OUTPATIENT_CLINIC_OR_DEPARTMENT_OTHER): Payer: No Typology Code available for payment source | Admitting: Hematology

## 2020-01-01 ENCOUNTER — Other Ambulatory Visit: Payer: Self-pay

## 2020-01-01 VITALS — BP 131/82 | HR 79 | Temp 97.7°F | Resp 18 | Ht 70.0 in | Wt 178.0 lb

## 2020-01-01 VITALS — BP 116/75 | HR 73 | Temp 98.1°F | Resp 20

## 2020-01-01 DIAGNOSIS — Z7189 Other specified counseling: Secondary | ICD-10-CM

## 2020-01-01 DIAGNOSIS — C9000 Multiple myeloma not having achieved remission: Secondary | ICD-10-CM | POA: Diagnosis not present

## 2020-01-01 DIAGNOSIS — D649 Anemia, unspecified: Secondary | ICD-10-CM

## 2020-01-01 LAB — CBC WITH DIFFERENTIAL/PLATELET
Abs Immature Granulocytes: 0.02 10*3/uL (ref 0.00–0.07)
Basophils Absolute: 0 10*3/uL (ref 0.0–0.1)
Basophils Relative: 0 %
Eosinophils Absolute: 0.1 10*3/uL (ref 0.0–0.5)
Eosinophils Relative: 2 %
HCT: 30.5 % — ABNORMAL LOW (ref 39.0–52.0)
Hemoglobin: 9.9 g/dL — ABNORMAL LOW (ref 13.0–17.0)
Immature Granulocytes: 0 %
Lymphocytes Relative: 13 %
Lymphs Abs: 1 10*3/uL (ref 0.7–4.0)
MCH: 27.3 pg (ref 26.0–34.0)
MCHC: 32.5 g/dL (ref 30.0–36.0)
MCV: 84 fL (ref 80.0–100.0)
Monocytes Absolute: 0.4 10*3/uL (ref 0.1–1.0)
Monocytes Relative: 6 %
Neutro Abs: 6.1 10*3/uL (ref 1.7–7.7)
Neutrophils Relative %: 79 %
Platelets: 286 10*3/uL (ref 150–400)
RBC: 3.63 MIL/uL — ABNORMAL LOW (ref 4.22–5.81)
RDW: 14.1 % (ref 11.5–15.5)
WBC: 7.7 10*3/uL (ref 4.0–10.5)
nRBC: 0 % (ref 0.0–0.2)

## 2020-01-01 LAB — CMP (CANCER CENTER ONLY)
ALT: 11 U/L (ref 0–44)
AST: 6 U/L — ABNORMAL LOW (ref 15–41)
Albumin: 2.7 g/dL — ABNORMAL LOW (ref 3.5–5.0)
Alkaline Phosphatase: 118 U/L (ref 38–126)
Anion gap: 10 (ref 5–15)
BUN: 26 mg/dL — ABNORMAL HIGH (ref 8–23)
CO2: 23 mmol/L (ref 22–32)
Calcium: 8.1 mg/dL — ABNORMAL LOW (ref 8.9–10.3)
Chloride: 109 mmol/L (ref 98–111)
Creatinine: 1.66 mg/dL — ABNORMAL HIGH (ref 0.61–1.24)
GFR, Est AFR Am: 50 mL/min — ABNORMAL LOW (ref >60)
GFR, Estimated: 43 mL/min — ABNORMAL LOW (ref >60)
Glucose, Bld: 202 mg/dL — ABNORMAL HIGH (ref 70–99)
Potassium: 3.3 mmol/L — ABNORMAL LOW (ref 3.5–5.1)
Sodium: 142 mmol/L (ref 135–145)
Total Bilirubin: 0.3 mg/dL (ref 0.3–1.2)
Total Protein: 6.5 g/dL (ref 6.5–8.1)

## 2020-01-01 MED ORDER — SODIUM CHLORIDE 0.9 % IV SOLN
40.0000 mg | Freq: Once | INTRAVENOUS | Status: DC
  Filled 2020-01-01: qty 4

## 2020-01-01 MED ORDER — MEPERIDINE HCL 25 MG/ML IJ SOLN
25.0000 mg | Freq: Once | INTRAMUSCULAR | Status: AC
  Administered 2020-01-01: 25 mg via INTRAVENOUS

## 2020-01-01 MED ORDER — MONTELUKAST SODIUM 10 MG PO TABS
10.0000 mg | ORAL_TABLET | Freq: Once | ORAL | Status: AC
  Administered 2020-01-01: 10 mg via ORAL

## 2020-01-01 MED ORDER — SODIUM CHLORIDE 0.9 % IV SOLN
40.0000 mg | Freq: Once | INTRAVENOUS | Status: AC
  Administered 2020-01-01: 40 mg via INTRAVENOUS
  Filled 2020-01-01: qty 4

## 2020-01-01 MED ORDER — DIPHENHYDRAMINE HCL 25 MG PO CAPS
50.0000 mg | ORAL_CAPSULE | Freq: Once | ORAL | Status: AC
  Administered 2020-01-01: 50 mg via ORAL

## 2020-01-01 MED ORDER — SODIUM CHLORIDE 0.9 % IV SOLN
Freq: Once | INTRAVENOUS | Status: AC
  Filled 2020-01-01: qty 250

## 2020-01-01 MED ORDER — METHYLPREDNISOLONE SODIUM SUCC 125 MG IJ SOLR
100.0000 mg | Freq: Once | INTRAMUSCULAR | Status: AC
  Administered 2020-01-01: 100 mg via INTRAVENOUS

## 2020-01-01 MED ORDER — SODIUM CHLORIDE 0.9 % IV SOLN
16.0000 mg/kg | Freq: Once | INTRAVENOUS | Status: AC
  Administered 2020-01-01: 1300 mg via INTRAVENOUS
  Filled 2020-01-01: qty 60

## 2020-01-01 MED ORDER — ACETAMINOPHEN 325 MG PO TABS
650.0000 mg | ORAL_TABLET | Freq: Once | ORAL | Status: AC
  Administered 2020-01-01: 650 mg via ORAL

## 2020-01-01 NOTE — Progress Notes (Signed)
Scr. 1.66 today. Okay to treat per Dr. Irene Limbo.

## 2020-01-01 NOTE — Patient Instructions (Signed)
[facility] Discharge Instructions for Patients Receiving Chemotherapy  Today you received the following chemotherapy agents: Darzalex  To help prevent nausea and vomiting after your treatment, we encourage you to take your nausea medication as directed.    If you develop nausea and vomiting that is not controlled by your nausea medication, call the clinic.   BELOW ARE SYMPTOMS THAT SHOULD BE REPORTED IMMEDIATELY:  *FEVER GREATER THAN 100.5 F  *CHILLS WITH OR WITHOUT FEVER  NAUSEA AND VOMITING THAT IS NOT CONTROLLED WITH YOUR NAUSEA MEDICATION  *UNUSUAL SHORTNESS OF BREATH  *UNUSUAL BRUISING OR BLEEDING  TENDERNESS IN MOUTH AND THROAT WITH OR WITHOUT PRESENCE OF ULCERS  *URINARY PROBLEMS  *BOWEL PROBLEMS  UNUSUAL RASH Items with * indicate a potential emergency and should be followed up as soon as possible.  Feel free to call the clinic should you have any questions or concerns. The clinic phone number is [phone].  Please show the CHEMO ALERT CARD at check-in to the Emergency Department and triage nurse.   

## 2020-01-01 NOTE — Progress Notes (Signed)
HEMATOLOGY/ONCOLOGY CONSULTATION NOTE  Date of Service: 01/01/2020  Patient Care Team: Isaac Bliss, Rayford Halsted, MD as PCP - General (Internal Medicine) Martinique, Peter M, MD as PCP - Cardiology (Cardiology)  CHIEF COMPLAINTS/PURPOSE OF CONSULTATION:  Recently diagnosed myeloma  HISTORY OF PRESENTING ILLNESS:  Bueford Arp is a wonderful 64 y.o. male who has been referred to [place] by Dr Servando Snare for evaluation and management of elevated protein. Pt is accompanied today by his wife, Mrs. Paradis. The pt reports that he is doing well overall.   The pt reports he was first told that he had Diabetes two years ago. He has not previously needed to be on medications for his diabetes but has recently been started on Januvia by Dr. Elayne Snare, his Endocrinologist. His blood glucose was 149 this morning. He sees Burtis Junes - NP and Dr. Martinique for Cardiology. Pt has CAD and had to have open heart surgery 20 years ago. He also has HTN that he feels has been stable. His Cardiology team is currently managing his heart medications and recently told pt to discontinue taking Furosemide due kidney function on last labs. Dr. Martinique and Truitt Merle have been essentially functioning as his primary care, as he does not have a PCP at this time. He has an upcoming appointment with Truitt Merle in the beginning of May and Dr. [name] next Tuesday. Pt is currently using Ibuprofen a couple of times per week for his knee pain.  Pt has felt the same in the last 3-6 months and denies any new bone pain of any kinds. He is eating, functioning, and moving about as normal. He has lost about 13 lbs over the last few months. Pt does not smoke and does not drink much alcohol.   Most recent lab results (09/04/2019) of CBC, BMP and Hepatic function panel is as follows: all values are WNL except for RBC at 3.64, Hgb at 9.8, HCT at 29.8, Glucose at 129, BUN at 64, Creatinine at 2.62, GFR Est Af Am at 29, CO2 at 15,  Total Protein at 10.0, Albumin at 3.6, ALP at 163, ALT at 45.   On review of systems, pt reports unexpected weight loss, left knee pain and denies new back pain, new shoulder pain, new hip pain, chest pain, SOB, leg swelling, testicular pain/swelling and any other symptoms.   On PMHx the pt reports Left Knee Arthritis, CAD, CHF, Type II Diabetes, HTN, HLD, Cardiac Catheterization, Coronary Artery Bypass Graft x4 (2002). On Social Hx the pt reports that he is a non-smoker and does not drink much alcohol.  INTERVAL HISTORY:   Benjamine Strout is a wonderful 64 y.o. male is here for evaluation and management of newly diagnosed myeloma. The patient's last visit with [place] was on 12/04/2019. The pt reports that he is doing well overall.  The pt reports that for the last few days his legs have felt heavy, but have not been swollen. He notes that his blood glucose levels have gone up since starting treatment. They are not regularly over 200-250 at home.   Lab results today (01/01/20) of CBC w/diff and CMP is as follows: all values are WNL except for RBC at 3.63, Hgb at 9.9, HCT at 30.5, Potassium at 3.3, Glucose at 202, BUN at 26, Creatinine at 1.66, Calcium at 8.1, Albumin at 2.7, AST at 6, GFR Est Afr Am at 50. 01/01/2020 MMP is in progress 01/01/2020 K/L light chains is in progress  On review  of systems, pt denies bone pain, leg swelling, back pain, abdominal pain and any other symptoms.   MEDICAL HISTORY:  Past Medical History:  Diagnosis Date  . Arthritis    "left knee" (11/24/2017)  . CAD (coronary artery disease)    CABG 2002  . CHF (congestive heart failure) ([place])   . Chronic renal insufficiency   . Diabetes mellitus without complication ([place])   . Dilated cardiomyopathy ([place])    echo in 2009 showed improvement with a normal EF  . HTN (hypertension)   . Hyperlipemia   . Noncompliance   . PAD (peripheral artery disease) ([place])     SURGICAL HISTORY: Past Surgical History:    Procedure Laterality Date  . CARDIAC CATHETERIZATION  2002, 2006  . CORONARY ARTERY BYPASS GRAFT  2002   x4 DR. VAN TRIGT  . IR FLUORO GUIDED NEEDLE PLC ASPIRATION/INJECTION LOC  10/24/2019    SOCIAL HISTORY: Social History   Socioeconomic History  . Marital status: Married    Spouse name: Not on file  . Number of children: 1  . Years of education: Not on file  . Highest education level: Not on file  Occupational History  . Occupation: Furniture conservator/restorer  Tobacco Use  . Smoking status: Never Smoker  . Smokeless tobacco: Never Used  Vaping Use  . Vaping Use: Never used  Substance and Sexual Activity  . Alcohol use: No  . Drug use: No  . Sexual activity: Yes  Other Topics Concern  . Not on file  Social History Narrative  . Not on file   Social Determinants of Health   Financial Resource Strain:   . Difficulty of Paying Living Expenses:   Food Insecurity:   . Worried About Charity fundraiser in the Last Year:   . Arboriculturist in the Last Year:   Transportation Needs:   . Film/video editor (Medical):   [name] Lack of Transportation (Non-Medical):   Physical Activity:   . Days of Exercise per Week:   . Minutes of Exercise per Session:   Stress:   . Feeling of Stress :   Social Connections:   . Frequency of Communication with Friends and Family:   . Frequency of Social Gatherings with Friends and Family:   . Attends Religious Services:   . Active Member of Clubs or Organizations:   . Attends Archivist Meetings:   [name] Marital Status:   Intimate Partner Violence:   . Fear of Current or Ex-Partner:   . Emotionally Abused:   [name] Physically Abused:   . Sexually Abused:     FAMILY HISTORY: Family History  Problem Relation Age of Onset  . Hypertension Father   . Diabetes Mother        DIABETIC COMA    ALLERGIES:  has No Known Allergies.  MEDICATIONS:  Current Outpatient Medications  Medication Sig Dispense Refill  . Accu-Chek Softclix Lancets lancets  Use Accu Chek softclix lancets to check blood sugar fasting or 2 hours after a meal every other day. 100 each 2  . acyclovir (ZOVIRAX) 400 MG tablet Take 0.5 tablets (200 mg total) by mouth 2 (two) times daily. 60 tablet 11  . amLODipine (NORVASC) 5 MG tablet Take 1 tablet (5 mg total) by mouth daily. 90 tablet 3  . aspirin 325 MG EC tablet Take 81 mg by mouth daily.      [name] atorvastatin (LIPITOR) 80 MG tablet Take 1 tablet (80 mg total) by mouth daily. [place]  tablet 3  . Blood Glucose Monitoring Suppl (ACCU-CHEK GUIDE ME) w/Device KIT 1 each by Does not apply route.    . carvedilol (COREG) 25 MG tablet Take 1 tablet (25 mg total) by mouth 2 (two) times daily with a meal. 180 tablet 3  . dexamethasone (DECADRON) 4 MG tablet 3 tabs ('12mg'$ ) orally with breakfast the day after each Daratumumab treatment. 60 tablet 2  . glimepiride (AMARYL) 1 MG tablet Take 1 tablet (1 mg total) by mouth daily with breakfast. 30 tablet 1  . glucose blood (ACCU-CHEK GUIDE) test strip Use Accu Chek test strips as instructed to check blood sugar fasting or two hours after meals, every other day. 100 each 2  . hydrALAZINE (APRESOLINE) 50 MG tablet TAKE 1 TABLET BY MOUTH THREE TIMES DAILY 270 tablet 2  . ibuprofen (ADVIL,MOTRIN) 200 MG tablet Take 200 mg by mouth every 6 (six) hours as needed for moderate pain (for knee).    . isosorbide mononitrate (IMDUR) 30 MG 24 hr tablet Take 1 tablet (30 mg total) by mouth daily. 90 tablet 3  . losartan (COZAAR) 100 MG tablet Take 1 tablet by mouth once daily 90 tablet 1  . ondansetron (ZOFRAN) 8 MG tablet Take 1 tablet (8 mg total) by mouth 2 (two) times daily as needed (Nausea or vomiting). 30 tablet 1  . prochlorperazine (COMPAZINE) 10 MG tablet Take 1 tablet (10 mg total) by mouth every 6 (six) hours as needed (Nausea or vomiting). 30 tablet 1  . Semaglutide (RYBELSUS) 7 MG TABS Take 1 tablet by mouth daily before breakfast. Take 30 minutes before breakfast with water 30 tablet 3   No  current facility-administered medications for this visit.    REVIEW OF SYSTEMS:  A 10+ POINT REVIEW OF SYSTEMS WAS OBTAINED including neurology, dermatology, psychiatry, cardiac, respiratory, lymph, extremities, GI, GU, Musculoskeletal, constitutional, breasts, reproductive, HEENT.  All pertinent positives are noted in the HPI.  All others are negative.   PHYSICAL EXAMINATION: ECOG PERFORMANCE STATUS: 0 - Asymptomatic  VS reviewed - stable  GENERAL:alert, in no acute distress and comfortable SKIN: no acute rashes, no significant lesions EYES: conjunctiva are pink and non-injected, sclera anicteric OROPHARYNX: MMM, no exudates, no oropharyngeal erythema or ulceration NECK: supple, no JVD LYMPH:  no palpable lymphadenopathy in the cervical, axillary or inguinal regions LUNGS: clear to auscultation b/l with normal respiratory effort HEART: regular rate & rhythm ABDOMEN:  normoactive bowel sounds , non tender, not distended. No palpable hepatosplenomegaly.  Extremity: no pedal edema PSYCH: alert & oriented x 3 with fluent speech NEURO: no focal motor/sensory deficits  LABORATORY DATA:  I have reviewed the data as listed  . CBC Latest Ref Rng & Units 01/01/2020 12/26/2019 12/18/2019  WBC 4.0 - 10.5 K/uL 7.7 7.2 6.2  Hemoglobin 13.0 - 17.0 g/dL 9.9(L) 10.4(L) 9.8(L)  Hematocrit 39 - 52 % 30.5(L) 32.4(L) 30.7(L)  Platelets 150 - 400 K/uL 286 313 341    . CMP Latest Ref Rng & Units 01/01/2020 12/26/2019 12/25/2019  Glucose 70 - 99 mg/dL 202(H) 216(H) 188(H)  BUN 8 - 23 mg/dL 26(H) 18 18  Creatinine 0.61 - 1.24 mg/dL 1.66(H) 1.60(H) 1.55(H)  Sodium 135 - 145 mmol/L 142 140 138  Potassium 3.5 - 5.1 mmol/L 3.3(L) 3.6 3.6  Chloride 98 - 111 mmol/L 109 107 104  CO2 22 - 32 mmol/L '23 22 20  '$ Calcium 8.9 - 10.3 mg/dL 8.1(L) 8.6(L) 8.5(L)  Total Protein 6.5 - 8.1 g/dL 6.5 7.1 -  Total  Bilirubin 0.3 - 1.2 mg/dL 0.3 0.5 -  Alkaline Phos 38 - 126 U/L 118 88 -  AST 15 - 41 U/L 6(L) 7(L) -  ALT 0  - 44 U/L 11 10 -   10/24/2019 FISH Panel:    10/24/2019 BM Bx Surgical Pathology:    RADIOGRAPHIC STUDIES: I have personally reviewed the radiological images as listed and agreed with the findings in the report. No results found.  ASSESSMENT & PLAN:    64 yo with   1)  Newly diagnosed Multiple myeloma -09/27/19 M Protein at 3.4 -10/25/2019 PET/CT ([id number]) revealed "1. No specific myelomatous lesions are identified. 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  2) Anemia ? Related to CKD vs ? Plasma cell dyscrasia 3) CKD ?etiology - HTN ? Cardiac issues ? Myeloma related.  PLAN: -Discussed pt labwork today, 01/01/20; blood counts are stable, mild anemia, kidney function is stable, Potassium & Calcium are low -Discussed 01/01/2020 MMP is in progress -Discussed 01/01/2020 K/L light chains is in progress -The pt has no prohibitive toxicities from continuing C2D22 Daratumumab/Dexamethasone at this time. -Recommend pt cut down Dexamethasone dosage from 12 mg to 8 mg the morning after treatment beginning tomorrow.  -Rx Ergocalciferol  -Will see back in 3 weeks with labs   FOLLOW UP: Please schedule C3 and C4 of Daratumumab as per orders Labs with each treatment MD visit with C3D15   The total time spent in the appt was 30 minutes and more than 50% was on counseling and direct patient cares, ordering and  mx of Daratumumab  All of the patient's questions were answered with apparent satisfaction. The patient knows to call the clinic with any problems, questions or concerns.    Sullivan Lone MD [place] AAHIVMS [facility] Hematology/Oncology [facility]  (Office):       [phone] (Work cell):  [phone] (Fax):           [phone]  01/01/2020 11:28 AM  I, Yevette Edwards, am acting as a scribe for Dr. Sullivan Lone.   .I have reviewed the above documentation for accuracy and completeness, and I agree with the above. Brunetta Genera MD

## 2020-01-02 LAB — KAPPA/LAMBDA LIGHT CHAINS
Kappa free light chain: 10.1 mg/L (ref 3.3–19.4)
Kappa, lambda light chain ratio: 1.23 (ref 0.26–1.65)
Lambda free light chains: 8.2 mg/L (ref 5.7–26.3)

## 2020-01-05 ENCOUNTER — Encounter: Payer: PRIVATE HEALTH INSURANCE | Admitting: Internal Medicine

## 2020-01-05 LAB — MULTIPLE MYELOMA PANEL, SERUM
Albumin SerPl Elph-Mcnc: 2.9 g/dL (ref 2.9–4.4)
Albumin/Glob SerPl: 1 (ref 0.7–1.7)
Alpha 1: 0.2 g/dL (ref 0.0–0.4)
Alpha2 Glob SerPl Elph-Mcnc: 0.9 g/dL (ref 0.4–1.0)
B-Globulin SerPl Elph-Mcnc: 0.8 g/dL (ref 0.7–1.3)
Gamma Glob SerPl Elph-Mcnc: 1.2 g/dL (ref 0.4–1.8)
Globulin, Total: 3 g/dL (ref 2.2–3.9)
IgA: 42 mg/dL — ABNORMAL LOW (ref 61–437)
IgG (Immunoglobin G), Serum: 1427 mg/dL (ref 603–1613)
IgM (Immunoglobulin M), Srm: 17 mg/dL — ABNORMAL LOW (ref 20–172)
M Protein SerPl Elph-Mcnc: 0.9 g/dL — ABNORMAL HIGH
Total Protein ELP: 5.9 g/dL — ABNORMAL LOW (ref 6.0–8.5)

## 2020-01-09 ENCOUNTER — Other Ambulatory Visit: Payer: Self-pay

## 2020-01-09 ENCOUNTER — Inpatient Hospital Stay: Payer: No Typology Code available for payment source

## 2020-01-09 ENCOUNTER — Other Ambulatory Visit: Payer: Self-pay | Admitting: Hematology

## 2020-01-09 ENCOUNTER — Other Ambulatory Visit: Payer: Self-pay | Admitting: Nurse Practitioner

## 2020-01-09 VITALS — BP 138/68 | HR 69 | Temp 97.7°F | Resp 18

## 2020-01-09 DIAGNOSIS — Z7189 Other specified counseling: Secondary | ICD-10-CM

## 2020-01-09 DIAGNOSIS — C9000 Multiple myeloma not having achieved remission: Secondary | ICD-10-CM

## 2020-01-09 LAB — CMP (CANCER CENTER ONLY)
ALT: 7 U/L (ref 0–44)
AST: 7 U/L — ABNORMAL LOW (ref 15–41)
Albumin: 2.8 g/dL — ABNORMAL LOW (ref 3.5–5.0)
Alkaline Phosphatase: 92 U/L (ref 38–126)
Anion gap: 11 (ref 5–15)
BUN: 18 mg/dL (ref 8–23)
CO2: 23 mmol/L (ref 22–32)
Calcium: 8.5 mg/dL — ABNORMAL LOW (ref 8.9–10.3)
Chloride: 107 mmol/L (ref 98–111)
Creatinine: 1.64 mg/dL — ABNORMAL HIGH (ref 0.61–1.24)
GFR, Est AFR Am: 50 mL/min — ABNORMAL LOW (ref >60)
GFR, Estimated: 44 mL/min — ABNORMAL LOW (ref >60)
Glucose, Bld: 253 mg/dL — ABNORMAL HIGH (ref 70–99)
Potassium: 3.8 mmol/L (ref 3.5–5.1)
Sodium: 141 mmol/L (ref 135–145)
Total Bilirubin: 0.3 mg/dL (ref 0.3–1.2)
Total Protein: 6.7 g/dL (ref 6.5–8.1)

## 2020-01-09 LAB — CBC WITH DIFFERENTIAL/PLATELET
Abs Immature Granulocytes: 0.01 10*3/uL (ref 0.00–0.07)
Basophils Absolute: 0 10*3/uL (ref 0.0–0.1)
Basophils Relative: 0 %
Eosinophils Absolute: 0.2 10*3/uL (ref 0.0–0.5)
Eosinophils Relative: 3 %
HCT: 30.2 % — ABNORMAL LOW (ref 39.0–52.0)
Hemoglobin: 9.7 g/dL — ABNORMAL LOW (ref 13.0–17.0)
Immature Granulocytes: 0 %
Lymphocytes Relative: 16 %
Lymphs Abs: 0.9 10*3/uL (ref 0.7–4.0)
MCH: 26.7 pg (ref 26.0–34.0)
MCHC: 32.1 g/dL (ref 30.0–36.0)
MCV: 83.2 fL (ref 80.0–100.0)
Monocytes Absolute: 0.6 10*3/uL (ref 0.1–1.0)
Monocytes Relative: 10 %
Neutro Abs: 4.1 10*3/uL (ref 1.7–7.7)
Neutrophils Relative %: 71 %
Platelets: 234 10*3/uL (ref 150–400)
RBC: 3.63 MIL/uL — ABNORMAL LOW (ref 4.22–5.81)
RDW: 14.1 % (ref 11.5–15.5)
WBC: 5.8 10*3/uL (ref 4.0–10.5)
nRBC: 0 % (ref 0.0–0.2)

## 2020-01-09 MED ORDER — METHYLPREDNISOLONE SODIUM SUCC 125 MG IJ SOLR
100.0000 mg | Freq: Once | INTRAMUSCULAR | Status: AC
  Administered 2020-01-09: 100 mg via INTRAVENOUS

## 2020-01-09 MED ORDER — SODIUM CHLORIDE 0.9 % IV SOLN
Freq: Once | INTRAVENOUS | Status: AC
  Filled 2020-01-09: qty 250

## 2020-01-09 MED ORDER — MEPERIDINE HCL 25 MG/ML IJ SOLN
25.0000 mg | Freq: Once | INTRAMUSCULAR | Status: AC
  Administered 2020-01-09: 25 mg via INTRAVENOUS

## 2020-01-09 MED ORDER — SODIUM CHLORIDE 0.9 % IV SOLN
40.0000 mg | Freq: Once | INTRAVENOUS | Status: AC
  Administered 2020-01-09: 40 mg via INTRAVENOUS
  Filled 2020-01-09: qty 4

## 2020-01-09 MED ORDER — MONTELUKAST SODIUM 10 MG PO TABS
10.0000 mg | ORAL_TABLET | Freq: Once | ORAL | Status: AC
  Administered 2020-01-09: 10 mg via ORAL

## 2020-01-09 MED ORDER — SODIUM CHLORIDE 0.9 % IV SOLN
16.0000 mg/kg | Freq: Once | INTRAVENOUS | Status: AC
  Administered 2020-01-09: 1300 mg via INTRAVENOUS
  Filled 2020-01-09: qty 60

## 2020-01-09 MED ORDER — DIPHENHYDRAMINE HCL 25 MG PO CAPS
50.0000 mg | ORAL_CAPSULE | Freq: Once | ORAL | Status: AC
  Administered 2020-01-09: 50 mg via ORAL

## 2020-01-09 MED ORDER — ACETAMINOPHEN 325 MG PO TABS
650.0000 mg | ORAL_TABLET | Freq: Once | ORAL | Status: AC
  Administered 2020-01-09: 650 mg via ORAL

## 2020-01-09 NOTE — Progress Notes (Signed)
Verbal order per Dr. Irene Limbo: [name] to receive Daratumumab today with Cr 1.64

## 2020-01-10 LAB — KAPPA/LAMBDA LIGHT CHAINS
Kappa free light chain: 12.5 mg/L (ref 3.3–19.4)
Kappa, lambda light chain ratio: 1.1 (ref 0.26–1.65)
Lambda free light chains: 11.4 mg/L (ref 5.7–26.3)

## 2020-01-11 LAB — MULTIPLE MYELOMA PANEL, SERUM
Albumin SerPl Elph-Mcnc: 2.9 g/dL (ref 2.9–4.4)
Albumin/Glob SerPl: 1 (ref 0.7–1.7)
Alpha 1: 0.2 g/dL (ref 0.0–0.4)
Alpha2 Glob SerPl Elph-Mcnc: 0.9 g/dL (ref 0.4–1.0)
B-Globulin SerPl Elph-Mcnc: 0.9 g/dL (ref 0.7–1.3)
Gamma Glob SerPl Elph-Mcnc: 1.1 g/dL (ref 0.4–1.8)
Globulin, Total: 3.2 g/dL (ref 2.2–3.9)
IgA: 48 mg/dL — ABNORMAL LOW (ref 61–437)
IgG (Immunoglobin G), Serum: 1321 mg/dL (ref 603–1613)
IgM (Immunoglobulin M), Srm: 19 mg/dL — ABNORMAL LOW (ref 20–172)
M Protein SerPl Elph-Mcnc: 1 g/dL — ABNORMAL HIGH
Total Protein ELP: 6.1 g/dL (ref 6.0–8.5)

## 2020-01-23 ENCOUNTER — Inpatient Hospital Stay: Payer: PRIVATE HEALTH INSURANCE

## 2020-01-23 ENCOUNTER — Other Ambulatory Visit: Payer: Self-pay

## 2020-01-23 ENCOUNTER — Inpatient Hospital Stay: Payer: PRIVATE HEALTH INSURANCE | Attending: Hematology

## 2020-01-23 VITALS — BP 142/62 | HR 67 | Temp 98.2°F | Resp 18 | Wt 174.0 lb

## 2020-01-23 DIAGNOSIS — E1122 Type 2 diabetes mellitus with diabetic chronic kidney disease: Secondary | ICD-10-CM | POA: Insufficient documentation

## 2020-01-23 DIAGNOSIS — Z7189 Other specified counseling: Secondary | ICD-10-CM

## 2020-01-23 DIAGNOSIS — C9000 Multiple myeloma not having achieved remission: Secondary | ICD-10-CM

## 2020-01-23 DIAGNOSIS — M1990 Unspecified osteoarthritis, unspecified site: Secondary | ICD-10-CM | POA: Diagnosis not present

## 2020-01-23 DIAGNOSIS — Z5112 Encounter for antineoplastic immunotherapy: Secondary | ICD-10-CM | POA: Insufficient documentation

## 2020-01-23 DIAGNOSIS — Z79899 Other long term (current) drug therapy: Secondary | ICD-10-CM | POA: Insufficient documentation

## 2020-01-23 DIAGNOSIS — I2510 Atherosclerotic heart disease of native coronary artery without angina pectoris: Secondary | ICD-10-CM | POA: Insufficient documentation

## 2020-01-23 DIAGNOSIS — E785 Hyperlipidemia, unspecified: Secondary | ICD-10-CM | POA: Diagnosis not present

## 2020-01-23 DIAGNOSIS — I739 Peripheral vascular disease, unspecified: Secondary | ICD-10-CM | POA: Insufficient documentation

## 2020-01-23 DIAGNOSIS — I130 Hypertensive heart and chronic kidney disease with heart failure and stage 1 through stage 4 chronic kidney disease, or unspecified chronic kidney disease: Secondary | ICD-10-CM | POA: Insufficient documentation

## 2020-01-23 DIAGNOSIS — Z7982 Long term (current) use of aspirin: Secondary | ICD-10-CM | POA: Insufficient documentation

## 2020-01-23 DIAGNOSIS — N189 Chronic kidney disease, unspecified: Secondary | ICD-10-CM | POA: Diagnosis not present

## 2020-01-23 DIAGNOSIS — Z7952 Long term (current) use of systemic steroids: Secondary | ICD-10-CM | POA: Insufficient documentation

## 2020-01-23 DIAGNOSIS — D649 Anemia, unspecified: Secondary | ICD-10-CM

## 2020-01-23 LAB — CMP (CANCER CENTER ONLY)
ALT: 8 U/L (ref 0–44)
AST: 9 U/L — ABNORMAL LOW (ref 15–41)
Albumin: 2.9 g/dL — ABNORMAL LOW (ref 3.5–5.0)
Alkaline Phosphatase: 88 U/L (ref 38–126)
Anion gap: 11 (ref 5–15)
BUN: 16 mg/dL (ref 8–23)
CO2: 22 mmol/L (ref 22–32)
Calcium: 9.3 mg/dL (ref 8.9–10.3)
Chloride: 109 mmol/L (ref 98–111)
Creatinine: 1.59 mg/dL — ABNORMAL HIGH (ref 0.61–1.24)
GFR, Est AFR Am: 52 mL/min — ABNORMAL LOW (ref >60)
GFR, Estimated: 45 mL/min — ABNORMAL LOW (ref >60)
Glucose, Bld: 213 mg/dL — ABNORMAL HIGH (ref 70–99)
Potassium: 3.7 mmol/L (ref 3.5–5.1)
Sodium: 142 mmol/L (ref 135–145)
Total Bilirubin: 0.6 mg/dL (ref 0.3–1.2)
Total Protein: 7.4 g/dL (ref 6.5–8.1)

## 2020-01-23 LAB — CBC WITH DIFFERENTIAL/PLATELET
Abs Immature Granulocytes: 0.02 10*3/uL (ref 0.00–0.07)
Basophils Absolute: 0 10*3/uL (ref 0.0–0.1)
Basophils Relative: 0 %
Eosinophils Absolute: 0.1 10*3/uL (ref 0.0–0.5)
Eosinophils Relative: 1 %
HCT: 33 % — ABNORMAL LOW (ref 39.0–52.0)
Hemoglobin: 10.6 g/dL — ABNORMAL LOW (ref 13.0–17.0)
Immature Granulocytes: 0 %
Lymphocytes Relative: 17 %
Lymphs Abs: 1.2 10*3/uL (ref 0.7–4.0)
MCH: 26.2 pg (ref 26.0–34.0)
MCHC: 32.1 g/dL (ref 30.0–36.0)
MCV: 81.5 fL (ref 80.0–100.0)
Monocytes Absolute: 0.8 10*3/uL (ref 0.1–1.0)
Monocytes Relative: 11 %
Neutro Abs: 5 10*3/uL (ref 1.7–7.7)
Neutrophils Relative %: 71 %
Platelets: 337 10*3/uL (ref 150–400)
RBC: 4.05 MIL/uL — ABNORMAL LOW (ref 4.22–5.81)
RDW: 13.8 % (ref 11.5–15.5)
WBC: 7 10*3/uL (ref 4.0–10.5)
nRBC: 0 % (ref 0.0–0.2)

## 2020-01-23 MED ORDER — SODIUM CHLORIDE 0.9 % IV SOLN
40.0000 mg | Freq: Once | INTRAVENOUS | Status: DC
  Filled 2020-01-23: qty 4

## 2020-01-23 MED ORDER — MONTELUKAST SODIUM 10 MG PO TABS
ORAL_TABLET | ORAL | Status: AC
  Filled 2020-01-23: qty 1

## 2020-01-23 MED ORDER — DIPHENHYDRAMINE HCL 25 MG PO CAPS
50.0000 mg | ORAL_CAPSULE | Freq: Once | ORAL | Status: AC
  Administered 2020-01-23: 50 mg via ORAL

## 2020-01-23 MED ORDER — DIPHENHYDRAMINE HCL 25 MG PO CAPS
ORAL_CAPSULE | ORAL | Status: AC
  Filled 2020-01-23: qty 2

## 2020-01-23 MED ORDER — SODIUM CHLORIDE 0.9 % IV SOLN
40.0000 mg | Freq: Once | INTRAVENOUS | Status: AC
  Administered 2020-01-23: 40 mg via INTRAVENOUS
  Filled 2020-01-23: qty 4

## 2020-01-23 MED ORDER — MEPERIDINE HCL 25 MG/ML IJ SOLN
INTRAMUSCULAR | Status: AC
  Filled 2020-01-23: qty 1

## 2020-01-23 MED ORDER — ACETAMINOPHEN 325 MG PO TABS
650.0000 mg | ORAL_TABLET | Freq: Once | ORAL | Status: AC
  Administered 2020-01-23: 650 mg via ORAL

## 2020-01-23 MED ORDER — ACETAMINOPHEN 325 MG PO TABS
ORAL_TABLET | ORAL | Status: AC
  Filled 2020-01-23: qty 2

## 2020-01-23 MED ORDER — MONTELUKAST SODIUM 10 MG PO TABS
10.0000 mg | ORAL_TABLET | Freq: Once | ORAL | Status: AC
  Administered 2020-01-23: 10 mg via ORAL

## 2020-01-23 MED ORDER — METHYLPREDNISOLONE SODIUM SUCC 125 MG IJ SOLR
INTRAMUSCULAR | Status: AC
  Filled 2020-01-23: qty 2

## 2020-01-23 MED ORDER — SODIUM CHLORIDE 0.9 % IV SOLN
Freq: Once | INTRAVENOUS | Status: AC
  Filled 2020-01-23: qty 250

## 2020-01-23 MED ORDER — MEPERIDINE HCL 25 MG/ML IJ SOLN
25.0000 mg | Freq: Once | INTRAMUSCULAR | Status: AC
  Administered 2020-01-23: 25 mg via INTRAVENOUS

## 2020-01-23 MED ORDER — SODIUM CHLORIDE 0.9 % IV SOLN
16.0000 mg/kg | Freq: Once | INTRAVENOUS | Status: AC
  Administered 2020-01-23: 1300 mg via INTRAVENOUS
  Filled 2020-01-23: qty 60

## 2020-01-23 MED ORDER — METHYLPREDNISOLONE SODIUM SUCC 125 MG IJ SOLR
100.0000 mg | Freq: Once | INTRAMUSCULAR | Status: AC
  Administered 2020-01-23: 100 mg via INTRAVENOUS

## 2020-01-23 NOTE — Patient Instructions (Signed)
Cancer Center Discharge Instructions for Patients Receiving Chemotherapy  Today you received the following chemotherapy agents: daratumumab.  To help prevent nausea and vomiting after your treatment, we encourage you to take your nausea medication as directed.   If you develop nausea and vomiting that is not controlled by your nausea medication, call the clinic.   BELOW ARE SYMPTOMS THAT SHOULD BE REPORTED IMMEDIATELY:  *FEVER GREATER THAN 100.5 F  *CHILLS WITH OR WITHOUT FEVER  NAUSEA AND VOMITING THAT IS NOT CONTROLLED WITH YOUR NAUSEA MEDICATION  *UNUSUAL SHORTNESS OF BREATH  *UNUSUAL BRUISING OR BLEEDING  TENDERNESS IN MOUTH AND THROAT WITH OR WITHOUT PRESENCE OF ULCERS  *URINARY PROBLEMS  *BOWEL PROBLEMS  UNUSUAL RASH Items with * indicate a potential emergency and should be followed up as soon as possible.  Feel free to call the clinic should you have any questions or concerns. The clinic phone number is [phone].  Please show the CHEMO ALERT CARD at check-in to the Emergency Department and triage nurse.   

## 2020-01-23 NOTE — Progress Notes (Signed)
Dr. Irene Limbo aware pt OV not sched today despite request on last OV POF for C3D15. OK to proceed w/ tx today; labs stable. He plans to see pt on C4D1.  Kennith Center, Pharm.D., CPP 01/23/2020@10 :47 AM

## 2020-02-01 ENCOUNTER — Other Ambulatory Visit: Payer: Self-pay

## 2020-02-02 ENCOUNTER — Telehealth: Payer: Self-pay | Admitting: Internal Medicine

## 2020-02-02 ENCOUNTER — Encounter: Payer: Self-pay | Admitting: Family Medicine

## 2020-02-02 ENCOUNTER — Ambulatory Visit (INDEPENDENT_AMBULATORY_CARE_PROVIDER_SITE_OTHER): Payer: No Typology Code available for payment source | Admitting: Family Medicine

## 2020-02-02 ENCOUNTER — Other Ambulatory Visit (INDEPENDENT_AMBULATORY_CARE_PROVIDER_SITE_OTHER): Payer: No Typology Code available for payment source

## 2020-02-02 ENCOUNTER — Ambulatory Visit (INDEPENDENT_AMBULATORY_CARE_PROVIDER_SITE_OTHER)
Admission: RE | Admit: 2020-02-02 | Discharge: 2020-02-02 | Disposition: A | Discharge status: Home / Self Care | Payer: No Typology Code available for payment source | Source: Ambulatory Visit | Attending: Family Medicine | Admitting: Family Medicine

## 2020-02-02 VITALS — BP 130/80 | HR 69 | Ht 70.0 in | Wt 172.2 lb

## 2020-02-02 DIAGNOSIS — M79641 Pain in right hand: Secondary | ICD-10-CM

## 2020-02-02 DIAGNOSIS — M2550 Pain in unspecified joint: Secondary | ICD-10-CM

## 2020-02-02 DIAGNOSIS — E1165 Type 2 diabetes mellitus with hyperglycemia: Secondary | ICD-10-CM | POA: Diagnosis not present

## 2020-02-02 LAB — GLUCOSE, RANDOM: Glucose, Bld: 255 mg/dL — ABNORMAL HIGH (ref 70–99)

## 2020-02-02 MED ORDER — PREDNISONE 20 MG PO TABS: ORAL_TABLET | ORAL | 0 refills | Status: DC

## 2020-02-02 NOTE — Telephone Encounter (Signed)
Please advise, thanks.

## 2020-02-02 NOTE — Telephone Encounter (Signed)
Pts spouse if calling in stated that the pt had the xray done on his wrist and it is swelling and he is in a lot of pain and would like to see if something can be called in today.  [facility]ela on [address]

## 2020-02-02 NOTE — Telephone Encounter (Signed)
Spoke with patient.  X-ray over read pending.  Labs pending.  He called back requesting something for his wrist and hand pain.  Refer to her note for details regarding acute swelling.  He does have type 2 diabetes.  We discussed short-term prednisone trial until we can further clarify and he is aware this may run up his blood sugars and he will monitor closely at home.  Send in prednisone 20 mg 2 tablets once daily for 5 days and will be in touch after labs are back.

## 2020-02-02 NOTE — Progress Notes (Signed)
Established Patient Office Visit  Subjective:  Patient ID: [name], male    DOB: July 26, 1955  Age: 64 y.o. MRN: [id number]  CC:  Chief Complaint  Patient presents with  . Wrist Pain    HPI [name] presents for right hand and wrist swelling and pain.  Onset about a week ago.  No known injury.  He has multiple myeloma which is being treated.  His other chronic problems reportedly include combined systolic and diastolic heart failure, history of CAD, hypertension, type 2 diabetes, chronic kidney disease, dyslipidemia.  He states the pain he has currently started relatively abruptly about a week ago.  He has had similar pains that have come abruptly involving other joints such as knee in the past.  Occasional elbow pains.  No known history of gout or pseudogout.  No known history of inflammatory arthritis.  He has known degenerative arthritis changes involving multiple digits of the hand  Past Medical History:  Diagnosis Date  . Arthritis    "left knee" (11/24/2017)  . CAD (coronary artery disease)    CABG 2002  . CHF (congestive heart failure) ([place])   . Chronic renal insufficiency   . Diabetes mellitus without complication ([place])   . Dilated cardiomyopathy ([place])    echo in 2009 showed improvement with a normal EF  . HTN (hypertension)   . Hyperlipemia   . Noncompliance   . PAD (peripheral artery disease) ([place])     Past Surgical History:  Procedure Laterality Date  . CARDIAC CATHETERIZATION  2002, 2006  . CORONARY ARTERY BYPASS GRAFT  2002   x4 DR. VAN TRIGT  . IR FLUORO GUIDED NEEDLE PLC ASPIRATION/INJECTION LOC  10/24/2019    Family History  Problem Relation Age of Onset  . Hypertension Father   . Diabetes Mother        DIABETIC COMA    Social History   Socioeconomic History  . Marital status: Married    Spouse name: Not on file  . Number of children: 1  . Years of education: Not on file  . Highest education level: Not on file  Occupational  History  . Occupation: Furniture conservator/restorer  Tobacco Use  . Smoking status: Never Smoker  . Smokeless tobacco: Never Used  Vaping Use  . Vaping Use: Never used  Substance and Sexual Activity  . Alcohol use: No  . Drug use: No  . Sexual activity: Yes  Other Topics Concern  . Not on file  Social History Narrative  . Not on file   Social Determinants of Health   Financial Resource Strain:   . Difficulty of Paying Living Expenses:   Food Insecurity:   . Worried About Charity fundraiser in the Last Year:   . Arboriculturist in the Last Year:   Transportation Needs:   . Film/video editor (Medical):   [name] Lack of Transportation (Non-Medical):   Physical Activity:   . Days of Exercise per Week:   . Minutes of Exercise per Session:   Stress:   . Feeling of Stress :   Social Connections:   . Frequency of Communication with Friends and Family:   . Frequency of Social Gatherings with Friends and Family:   . Attends Religious Services:   . Active Member of Clubs or Organizations:   . Attends Archivist Meetings:   [name] Marital Status:   Intimate Partner Violence:   . Fear of Current or Ex-Partner:   . Emotionally  Abused:   [name] Physically Abused:   . Sexually Abused:     Outpatient Medications Prior to Visit  Medication Sig Dispense Refill  . Accu-Chek Softclix Lancets lancets Use Accu Chek softclix lancets to check blood sugar fasting or 2 hours after a meal every other day. 100 each 2  . acyclovir (ZOVIRAX) 400 MG tablet Take 0.5 tablets (200 mg total) by mouth 2 (two) times daily. 60 tablet 11  . aspirin 325 MG EC tablet Take 81 mg by mouth daily.      [name] atorvastatin (LIPITOR) 80 MG tablet Take 1 tablet (80 mg total) by mouth daily. 90 tablet 3  . Blood Glucose Monitoring Suppl (ACCU-CHEK GUIDE ME) w/Device KIT 1 each by Does not apply route.    . carvedilol (COREG) 25 MG tablet Take 1 tablet (25 mg total) by mouth 2 (two) times daily with a meal. 180 tablet 3  . dexamethasone  (DECADRON) 4 MG tablet 3 tabs ('12mg'$ ) orally with breakfast the day after each Daratumumab treatment. 60 tablet 2  . glimepiride (AMARYL) 1 MG tablet Take 1 tablet (1 mg total) by mouth daily with breakfast. 30 tablet 1  . glucose blood (ACCU-CHEK GUIDE) test strip Use Accu Chek test strips as instructed to check blood sugar fasting or two hours after meals, every other day. 100 each 2  . hydrALAZINE (APRESOLINE) 50 MG tablet TAKE 1 TABLET BY MOUTH THREE TIMES DAILY 270 tablet 2  . ibuprofen (ADVIL,MOTRIN) 200 MG tablet Take 200 mg by mouth every 6 (six) hours as needed for moderate pain (for knee).    . isosorbide mononitrate (IMDUR) 30 MG 24 hr tablet Take 1 tablet by mouth once daily 90 tablet 1  . losartan (COZAAR) 100 MG tablet Take 1 tablet by mouth once daily 90 tablet 1  . ondansetron (ZOFRAN) 8 MG tablet Take 1 tablet (8 mg total) by mouth 2 (two) times daily as needed (Nausea or vomiting). 30 tablet 1  . prochlorperazine (COMPAZINE) 10 MG tablet Take 1 tablet (10 mg total) by mouth every 6 (six) hours as needed (Nausea or vomiting). 30 tablet 1  . Semaglutide (RYBELSUS) 7 MG TABS Take 1 tablet by mouth daily before breakfast. Take 30 minutes before breakfast with water 30 tablet 3  . amLODipine (NORVASC) 5 MG tablet Take 1 tablet (5 mg total) by mouth daily. 90 tablet 3   No facility-administered medications prior to visit.    No Known Allergies  ROS Review of Systems  Constitutional: Negative for chills and fever.  HENT: Negative for congestion.   Respiratory: Negative for shortness of breath.   Cardiovascular: Negative for chest pain.  Genitourinary: Negative for dysuria.  Musculoskeletal: Positive for arthralgias.  Neurological: Negative for dizziness.  Hematological: Negative for adenopathy.      Objective:    Physical Exam Vitals reviewed.  Constitutional:      Appearance: Normal appearance.  Cardiovascular:     Rate and Rhythm: Normal rate and regular rhythm.    Pulmonary:     Effort: Pulmonary effort is normal.     Breath sounds: Normal breath sounds.  Musculoskeletal:     Comments: He has changes consistent with degenerative arthritis involving multiple digits of both hands.  He has obvious swelling of the right hand and wrist especially involving the MCP joints.  This seems to be predominantly second and third MCP joint.  Minimally warm to touch.  Slightly tender to touch.  Neurological:     Mental Status: He  is alert.     BP (!) 130/80   Pulse 69   Ht '5\' 10"'$  (1.778 m)   Wt 172 lb 4 oz (78.1 kg)   SpO2 91%   BMI 24.72 kg/m  Wt Readings from Last 3 Encounters:  02/02/20 172 lb 4 oz (78.1 kg)  01/23/20 174 lb (78.9 kg)  01/01/20 178 lb (80.7 kg)     Health Maintenance Due  Topic Date Due  . Hepatitis C Screening  Never done  . OPHTHALMOLOGY EXAM  Never done  . HIV Screening  Never done  . COLONOSCOPY  Never done  . FOOT EXAM  07/22/2019    There are no preventive care reminders to display for this patient.  Lab Results  Component Value Date   TSH 0.592 09/04/2019   Lab Results  Component Value Date   WBC 7.0 01/23/2020   HGB 10.6 (L) 01/23/2020   HCT 33.0 (L) 01/23/2020   MCV 81.5 01/23/2020   PLT 337 01/23/2020   Lab Results  Component Value Date   NA 142 01/23/2020   K 3.7 01/23/2020   CO2 22 01/23/2020   GLUCOSE 255 (H) 02/02/2020   BUN 16 01/23/2020   CREATININE 1.59 (H) 01/23/2020   BILITOT 0.6 01/23/2020   ALKPHOS 88 01/23/2020   AST 9 (L) 01/23/2020   ALT 8 01/23/2020   PROT 7.4 01/23/2020   ALBUMIN 2.9 (L) 01/23/2020   CALCIUM 9.3 01/23/2020   ANIONGAP 11 01/23/2020   GFR 61.66 11/20/2019   Lab Results  Component Value Date   CHOL 92 (L) 09/04/2019   Lab Results  Component Value Date   HDL 35 (L) 09/04/2019   Lab Results  Component Value Date   LDLCALC 39 09/04/2019   Lab Results  Component Value Date   TRIG 89 09/04/2019   Lab Results  Component Value Date   CHOLHDL 2.6 09/04/2019    Lab Results  Component Value Date   HGBA1C 8.0 (H) 12/20/2019      Assessment & Plan:   #1 patient describes rather acute onset of right hand and wrist pain about a week ago without any recent injury.  No known history of gout or pseudogout.  Does have history of multiple myeloma.  May have some bone pain related to the multiple myeloma but appears to have some acute joint swelling MCP joints and wrist.  -Recommend further labs with rheumatoid factor, C-reactive protein, CCP antibody, ANA, uric acid level -Reluctant to prescribe prednisone at this point without further clarification because of his diabetes status -Avoid nonsteroidals with his chronic kidney disease  No orders of the defined types were placed in this encounter.   Follow-up: No follow-ups on file.    Carolann Littler, MD

## 2020-02-02 NOTE — Addendum Note (Signed)
Addended by: Eulas Post on: 02/02/2020 05:03 PM   Modules accepted: Orders

## 2020-02-02 NOTE — Patient Instructions (Signed)
Joint Pain Joint pain (arthralgia) may be caused by many things. Joint pain is likely to go away when you follow instructions from your health care provider for relieving pain at home. However, joint pain can also be caused by conditions that require more treatment. Common causes of joint pain include:  Bruising in the area of the joint.  Injury caused by repeating certain movements too many times (overuse injury).  Age-related joint wear and tear (osteoarthritis).  Buildup of uric acid crystals in the joint (gout).  Inflammation of the joint (rheumatic disease).  Various other forms of arthritis.  Infections of the joint (septic arthritis) or of the bone (osteomyelitis). Your health care provider may recommend that you take pain medicine or wear a supportive device like an elastic bandage, sling, or splint. If your joint pain continues, you may need lab or imaging tests to diagnose the cause of your joint pain. Follow these instructions at home: Managing pain, stiffness, and swelling   If directed, put ice on the painful area. Icing can help to relieve joint pain and swelling. ? Put ice in a plastic bag. ? Place a towel between your skin and the bag. ? Leave the ice on for 20 minutes, 2-3 times a day.  If directed, apply heat to the painful area as often as told by your health care provider. Heat can reduce the stiffness of your muscles and joints. Use the heat source that your health care provider recommends, such as a moist heat pack or a heating pad. ? Place a towel between your skin and the heat source. ? Leave the heat on for 20-30 minutes. ? Remove the heat if your skin turns bright red. This is especially important if you are unable to feel pain, heat, or cold. You may have a greater risk of getting burned.  Move your fingers or toes below the painful joint often. You can avoid stiffness and lessen swelling by doing this.  If possible, raise (elevate) the painful joint above  the level of your heart while you are sitting or lying down. To do this, try putting a few pillows under the painful joint. Activity  Rest the painful joint for as long as directed. Do not do anything that causes or worsens pain.  Begin exercising or stretching the affected area, as told by your health care provider. Ask your health care provider what types of exercise are safe for you. If you have an elastic bandage, sling, or splint:  Wear the supportive device as told by your health care provider. Remove it only as told by your health care provider.  Loosen the device if your fingers or toes below the joint tingle, become numb, or turn cold and blue.  Keep the device clean.  Ask your health care provider if you should remove the device before bathing. You may need to cover it with a watertight covering when you take a bath or a shower. General instructions  Take over-the-counter and prescription medicines only as told by your health care provider.  Do not use any products that contain nicotine or tobacco, such as cigarettes and e-cigarettes. If you need help quitting, ask your health care provider.  Keep all follow-up visits as told by your health care provider. This is important. Contact a health care provider if:  You have pain that gets worse and does not get better with medicine.  Your joint pain does not improve within 3 days.  You have increased bruising or swelling.  You have a fever.  You lose 10 lb (4.5 kg) or more without trying. Get help right away if:  You cannot move the joint.  Your fingers or toes tingle, become numb, or turn cold and blue.  You have a fever along with a joint that is red, warm, and swollen. Summary  Joint pain (arthralgia) may be caused by many things.  Your health care provider may recommend that you take pain medicine or wear a supportive device like an elastic bandage, sling, or splint.  If your joint pain continues, you may need  tests to diagnose the cause of your joint pain.  Take over-the-counter and prescription medicines only as told by your health care provider. This information is not intended to replace advice given to you by your health care provider. Make sure you discuss any questions you have with your health care provider. Document Revised: 06/11/2017 Document Reviewed: 04/14/2017 Elsevier Patient Education  [place].

## 2020-02-03 LAB — FRUCTOSAMINE: Fructosamine: 322 umol/L — ABNORMAL HIGH (ref 0–285)

## 2020-02-05 ENCOUNTER — Other Ambulatory Visit: Payer: Self-pay | Admitting: Endocrinology

## 2020-02-05 MED ORDER — JANUMET XR 100-1000 MG PO TB24: ORAL_TABLET | ORAL | 1 refills | Status: DC

## 2020-02-05 NOTE — Progress Notes (Signed)
HEMATOLOGY/ONCOLOGY CONSULTATION NOTE  Date of Service: 02/06/2020  Patient Care Team: Isaac Bliss, Rayford Halsted, MD as PCP - General (Internal Medicine) Martinique, Peter M, MD as PCP - Cardiology (Cardiology)  CHIEF COMPLAINTS/PURPOSE OF CONSULTATION:  Recently diagnosed myeloma  HISTORY OF PRESENTING ILLNESS:  [name] is a wonderful 64 y.o. male who has been referred to [place] by Dr Servando Snare for evaluation and management of elevated protein. Pt is accompanied today by his wife, Mrs. Klingensmith. The pt reports that he is doing well overall.   The pt reports he was first told that he had Diabetes two years ago. He has not previously needed to be on medications for his diabetes but has recently been started on Januvia by Dr. Elayne Snare, his Endocrinologist. His blood glucose was 149 this morning. He sees Burtis Junes - NP and Dr. Martinique for Cardiology. Pt has CAD and had to have open heart surgery 20 years ago. He also has HTN that he feels has been stable. His Cardiology team is currently managing his heart medications and recently told pt to discontinue taking Furosemide due kidney function on last labs. Dr. Martinique and Truitt Merle have been essentially functioning as his primary care, as he does not have a PCP at this time. He has an upcoming appointment with Truitt Merle in the beginning of May and Dr. [name] next Tuesday. Pt is currently using Ibuprofen a couple of times per week for his knee pain.  Pt has felt the same in the last 3-6 months and denies any new bone pain of any kinds. He is eating, functioning, and moving about as normal. He has lost about 13 lbs over the last few months. Pt does not smoke and does not drink much alcohol.   Most recent lab results (09/04/2019) of CBC, BMP and Hepatic function panel is as follows: all values are WNL except for RBC at 3.64, Hgb at 9.8, HCT at 29.8, Glucose at 129, BUN at 64, Creatinine at 2.62, GFR Est Af Am at 29, CO2 at 15,  Total Protein at 10.0, Albumin at 3.6, ALP at 163, ALT at 45.   On review of systems, pt reports unexpected weight loss, left knee pain and denies new back pain, new shoulder pain, new hip pain, chest pain, SOB, leg swelling, testicular pain/swelling and any other symptoms.   On PMHx the pt reports Left Knee Arthritis, CAD, CHF, Type II Diabetes, HTN, HLD, Cardiac Catheterization, Coronary Artery Bypass Graft x4 (2002). On Social Hx the pt reports that he is a non-smoker and does not drink much alcohol.  INTERVAL HISTORY:   [name] is a wonderful 65 y.o. male is here for evaluation and management of newly diagnosed myeloma. The patient's last visit with [place] was on 01/01/2020. The pt reports that he is doing well overall.  The pt reports that he has been experiencing some knee pain due to arthritis, but denies any recent leg swelling. Last week pt was having some swelling in his right hand and saw Dr. Elease Hashimoto, who thought it could be due to arthritis or gout. Pt was given a course of Prednisone to take. Pt has never been evaluated by a Rheumatologist. He notes that his fasting blood glucose is around 150 at home and have been relatively steady. He is currently following with an Endocrinologist. Pt has had mild tingling/numbness in toes and feet recently, but no history of diabetic neuropathy.   Lab results today (02/06/20) of CBC w/diff  and CMP is as follows: all values are WNL except for WBC at 12.0K, RBC at 3.79, Hgb at 9.5, HCT at 30.0, MCV at 79.2, MCH at 25.1, PLT at 432K, Neutro Abs at 9.8K, Abs Immature Granulocytes at 0.12K, Potassium at 3.4, Glucose at 224, BUN at 26, Creatinine at 1.60, Albumin at 2.8, AST at 7, GFR Est Afr Am at 52.  On review of systems, pt reports joint pain, tingling/numbness in toes/feet and denies leg swelling, fevers, chills, nose bleeds, gum bleeds, bloody/black stools, abdominal pain and any other symptoms.   MEDICAL HISTORY:  Past Medical History:    Diagnosis Date  . Arthritis    "left knee" (11/24/2017)  . CAD (coronary artery disease)    CABG 2002  . CHF (congestive heart failure) ([place])   . Chronic renal insufficiency   . Diabetes mellitus without complication ([place])   . Dilated cardiomyopathy ([place])    echo in 2009 showed improvement with a normal EF  . HTN (hypertension)   . Hyperlipemia   . Noncompliance   . PAD (peripheral artery disease) ([place])     SURGICAL HISTORY: Past Surgical History:  Procedure Laterality Date  . CARDIAC CATHETERIZATION  2002, 2006  . CORONARY ARTERY BYPASS GRAFT  2002   x4 DR. VAN TRIGT  . IR FLUORO GUIDED NEEDLE PLC ASPIRATION/INJECTION LOC  10/24/2019    SOCIAL HISTORY: Social History   Socioeconomic History  . Marital status: Married    Spouse name: Not on file  . Number of children: 1  . Years of education: Not on file  . Highest education level: Not on file  Occupational History  . Occupation: Furniture conservator/restorer  Tobacco Use  . Smoking status: Never Smoker  . Smokeless tobacco: Never Used  Vaping Use  . Vaping Use: Never used  Substance and Sexual Activity  . Alcohol use: No  . Drug use: No  . Sexual activity: Yes  Other Topics Concern  . Not on file  Social History Narrative  . Not on file   Social Determinants of Health   Financial Resource Strain:   . Difficulty of Paying Living Expenses:   Food Insecurity:   . Worried About Charity fundraiser in the Last Year:   . Arboriculturist in the Last Year:   Transportation Needs:   . Film/video editor (Medical):   [name] Lack of Transportation (Non-Medical):   Physical Activity:   . Days of Exercise per Week:   . Minutes of Exercise per Session:   Stress:   . Feeling of Stress :   Social Connections:   . Frequency of Communication with Friends and Family:   . Frequency of Social Gatherings with Friends and Family:   . Attends Religious Services:   . Active Member of Clubs or Organizations:   . Attends Archivist  Meetings:   [name] Marital Status:   Intimate Partner Violence:   . Fear of Current or Ex-Partner:   . Emotionally Abused:   [name] Physically Abused:   . Sexually Abused:     FAMILY HISTORY: Family History  Problem Relation Age of Onset  . Hypertension Father   . Diabetes Mother        DIABETIC COMA    ALLERGIES:  has No Known Allergies.  MEDICATIONS:  Current Outpatient Medications  Medication Sig Dispense Refill  . Accu-Chek Softclix Lancets lancets Use Accu Chek softclix lancets to check blood sugar fasting or 2 hours after a meal every other  day. 100 each 2  . acyclovir (ZOVIRAX) 400 MG tablet Take 0.5 tablets (200 mg total) by mouth 2 (two) times daily. 60 tablet 11  . amLODipine (NORVASC) 5 MG tablet Take 1 tablet (5 mg total) by mouth daily. 90 tablet 3  . aspirin 325 MG EC tablet Take 81 mg by mouth daily.      [name] atorvastatin (LIPITOR) 80 MG tablet Take 1 tablet (80 mg total) by mouth daily. 90 tablet 3  . Blood Glucose Monitoring Suppl (ACCU-CHEK GUIDE ME) w/Device KIT 1 each by Does not apply route.    . carvedilol (COREG) 25 MG tablet Take 1 tablet (25 mg total) by mouth 2 (two) times daily with a meal. 180 tablet 3  . dexamethasone (DECADRON) 4 MG tablet 3 tabs (88m) orally with breakfast the day after each Daratumumab treatment. 60 tablet 2  . glimepiride (AMARYL) 1 MG tablet Take 1 tablet (1 mg total) by mouth daily with breakfast. 30 tablet 1  . glucose blood (ACCU-CHEK GUIDE) test strip Use Accu Chek test strips as instructed to check blood sugar fasting or two hours after meals, every other day. 100 each 2  . hydrALAZINE (APRESOLINE) 50 MG tablet TAKE 1 TABLET BY MOUTH THREE TIMES DAILY 270 tablet 2  . ibuprofen (ADVIL,MOTRIN) 200 MG tablet Take 200 mg by mouth every 6 (six) hours as needed for moderate pain (for knee).    . isosorbide mononitrate (IMDUR) 30 MG 24 hr tablet Take 1 tablet by mouth once daily 90 tablet 1  . losartan (COZAAR) 100 MG tablet Take 1 tablet by  mouth once daily 90 tablet 1  . ondansetron (ZOFRAN) 8 MG tablet Take 1 tablet (8 mg total) by mouth 2 (two) times daily as needed (Nausea or vomiting). 30 tablet 1  . predniSONE (DELTASONE) 20 MG tablet Take two tablets once daily for 5 days. 10 tablet 0  . prochlorperazine (COMPAZINE) 10 MG tablet Take 1 tablet (10 mg total) by mouth every 6 (six) hours as needed (Nausea or vomiting). 30 tablet 1  . Semaglutide (RYBELSUS) 7 MG TABS Take 1 tablet by mouth daily before breakfast. Take 30 minutes before breakfast with water 30 tablet 3  . SitaGLIPtin-MetFORMIN HCl (JANUMET XR) 351 [id number] MG TB24 1 tablet daily and dinnertime 30 tablet 1   No current facility-administered medications for this visit.    REVIEW OF SYSTEMS:  A 10+ POINT REVIEW OF SYSTEMS WAS OBTAINED including neurology, dermatology, psychiatry, cardiac, respiratory, lymph, extremities, GI, GU, Musculoskeletal, constitutional, breasts, reproductive, HEENT.  All pertinent positives are noted in the HPI.  All others are negative.   PHYSICAL EXAMINATION: ECOG PERFORMANCE STATUS: 0 - Asymptomatic  VS reviewed - stable  Exam was given in a chair   GENERAL:alert, in no acute distress and comfortable SKIN: no acute rashes, no significant lesions EYES: conjunctiva are pink and non-injected, sclera anicteric OROPHARYNX: MMM, no exudates, no oropharyngeal erythema or ulceration NECK: supple, no JVD LYMPH:  no palpable lymphadenopathy in the cervical, axillary or inguinal regions LUNGS: clear to auscultation b/l with normal respiratory effort HEART: regular rate & rhythm ABDOMEN:  normoactive bowel sounds , non tender, not distended. No palpable hepatosplenomegaly.  Extremity: no pedal edema PSYCH: alert & oriented x 3 with fluent speech NEURO: no focal motor/sensory deficits  LABORATORY DATA:  I have reviewed the data as listed  . CBC Latest Ref Rng & Units 02/06/2020 01/23/2020 01/09/2020  WBC 4.0 - 10.5 K/uL 12.0(H) 7.0 5.8    Hemoglobin  13.0 - 17.0 g/dL 9.5(L) 10.6(L) 9.7(L)  Hematocrit 39 - 52 % 30.0(L) 33.0(L) 30.2(L)  Platelets 150 - 400 K/uL 432(H) 337 234    . CMP Latest Ref Rng & Units 02/06/2020 02/02/2020 01/23/2020  Glucose 70 - 99 mg/dL 224(H) 255(H) 213(H)  BUN 8 - 23 mg/dL 26(H) - 16  Creatinine 0.61 - 1.24 mg/dL 1.60(H) - 1.59(H)  Sodium 135 - 145 mmol/L 141 - 142  Potassium 3.5 - 5.1 mmol/L 3.4(L) - 3.7  Chloride 98 - 111 mmol/L 109 - 109  CO2 22 - 32 mmol/L 22 - 22  Calcium 8.9 - 10.3 mg/dL 10.2 - 9.3  Total Protein 6.5 - 8.1 g/dL 7.1 - 7.4  Total Bilirubin 0.3 - 1.2 mg/dL 0.3 - 0.6  Alkaline Phos 38 - 126 U/L 114 - 88  AST 15 - 41 U/L 7(L) - 9(L)  ALT 0 - 44 U/L 9 - 8   10/24/2019 FISH Panel:    10/24/2019 [facility] Surgical Pathology:    RADIOGRAPHIC STUDIES: I have personally reviewed the radiological images as listed and agreed with the findings in the report. DG Hand Complete Right  Result Date: 02/03/2020 CLINICAL DATA:  Acute swell RIGHT hand for 1 week; history of coronary artery disease, CHF, chronic renal insufficiency, diabetes mellitus, dilated cardiomyopathy, multiple myeloma EXAM: RIGHT HAND - COMPLETE 3+ VIEW COMPARISON:  None FINDINGS: Fingers flexed on all views with fingers superimposed on lateral view, limiting assessment. Diffuse osseous demineralization. Soft tissue swelling throughout RIGHT hand. Erosive arthropathy changes of the RIGHT carpus and hand. Multiple juxta-articular erosions are identified including radiocarpal joint, first second third MCP joints, and a few IP joints. These changes suggest an inflammatory arthropathy or gout. No acute fracture, dislocation, or subluxations. Scattered vascular calcifications. IMPRESSION: Osseous demineralization with scattered erosive arthropathy changes at carpus, first second and third MCP joints and a few IP joints as well. These could represent an inflammatory arthropathy or gout. Electronically Signed   By: Lavonia Dana M.D.    On: 02/03/2020 21:00    ASSESSMENT & PLAN:    64 yo with   1)  Newly diagnosed Multiple myeloma -09/27/19 M Protein at 3.4 -10/25/2019 PET/CT ([id number]) revealed "1. No specific myelomatous lesions are identified. 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  2) Anemia ? Related to CKD vs ? Plasma cell dyscrasia 3) CKD ?etiology - HTN ? Cardiac issues ? Myeloma related.  PLAN: -Discussed pt labwork today, 02/06/20; mild anemia, WBC & PLT are okay, elevated Glucose, kidney numbers are stable  -Discussed 01/01/2020 MMP shows M Protein down to 0.9 g/dL - pt already achieved 60%+ reduction -Discussed 01/01/2020 K/L light chains shows all values are WNL -The pt has no prohibitive toxicities from continuing C4D1 Daratumumab/Dexamethasone at this time. -Will hold additional treatments at this time to reduce toxicities. May consider if treatment response stagnates.  -Advised pt that Prednisone may be increasing his Glucose levels. Continue to monitor fasting blood Glucose at home.  -Will repeat MMP with next  labs  -Will see back in 4 weeks with labs   FOLLOW UP: Please schedule C4D15, C5D1 and CD15, C6D1 and C6D15. Labs with each treatment MD visit with C5D1 in 4 weeks   The total time spent in the appt was 20 minutes and more than 50% was on counseling and direct patient cares.  All of the patient's questions were  answered with apparent satisfaction. The patient knows to call the clinic with any problems, questions or concerns.    Sullivan Lone MD [place] AAHIVMS [facility] Hematology/Oncology Physician [facility]  (Office):       [phone] (Work cell):  [phone] (Fax):           [phone]  02/06/2020 10:26 AM  I, Yevette Edwards, am acting as a scribe for Dr. Sullivan Lone.   .I have reviewed the above documentation for accuracy and completeness, and I agree with the above. Brunetta Genera MD

## 2020-02-06 ENCOUNTER — Inpatient Hospital Stay: Payer: PRIVATE HEALTH INSURANCE

## 2020-02-06 ENCOUNTER — Other Ambulatory Visit: Payer: Self-pay | Admitting: Internal Medicine

## 2020-02-06 ENCOUNTER — Other Ambulatory Visit: Payer: Self-pay

## 2020-02-06 ENCOUNTER — Inpatient Hospital Stay (HOSPITAL_BASED_OUTPATIENT_CLINIC_OR_DEPARTMENT_OTHER): Payer: PRIVATE HEALTH INSURANCE | Admitting: Hematology

## 2020-02-06 VITALS — BP 154/89 | HR 78 | Temp 98.1°F | Resp 18

## 2020-02-06 VITALS — BP 137/88 | HR 86 | Temp 97.7°F | Resp 18 | Ht 70.0 in | Wt 177.6 lb

## 2020-02-06 DIAGNOSIS — D649 Anemia, unspecified: Secondary | ICD-10-CM

## 2020-02-06 DIAGNOSIS — C9000 Multiple myeloma not having achieved remission: Secondary | ICD-10-CM | POA: Diagnosis not present

## 2020-02-06 DIAGNOSIS — Z7189 Other specified counseling: Secondary | ICD-10-CM

## 2020-02-06 DIAGNOSIS — M2550 Pain in unspecified joint: Secondary | ICD-10-CM

## 2020-02-06 DIAGNOSIS — Z5112 Encounter for antineoplastic immunotherapy: Secondary | ICD-10-CM | POA: Diagnosis not present

## 2020-02-06 DIAGNOSIS — M79641 Pain in right hand: Secondary | ICD-10-CM

## 2020-02-06 LAB — CBC WITH DIFFERENTIAL/PLATELET
Abs Immature Granulocytes: 0.12 10*3/uL — ABNORMAL HIGH (ref 0.00–0.07)
Basophils Absolute: 0 10*3/uL (ref 0.0–0.1)
Basophils Relative: 0 %
Eosinophils Absolute: 0 10*3/uL (ref 0.0–0.5)
Eosinophils Relative: 0 %
HCT: 30 % — ABNORMAL LOW (ref 39.0–52.0)
Hemoglobin: 9.5 g/dL — ABNORMAL LOW (ref 13.0–17.0)
Immature Granulocytes: 1 %
Lymphocytes Relative: 11 %
Lymphs Abs: 1.3 10*3/uL (ref 0.7–4.0)
MCH: 25.1 pg — ABNORMAL LOW (ref 26.0–34.0)
MCHC: 31.7 g/dL (ref 30.0–36.0)
MCV: 79.2 fL — ABNORMAL LOW (ref 80.0–100.0)
Monocytes Absolute: 0.8 10*3/uL (ref 0.1–1.0)
Monocytes Relative: 6 %
Neutro Abs: 9.8 10*3/uL — ABNORMAL HIGH (ref 1.7–7.7)
Neutrophils Relative %: 82 %
Platelets: 432 10*3/uL — ABNORMAL HIGH (ref 150–400)
RBC: 3.79 MIL/uL — ABNORMAL LOW (ref 4.22–5.81)
RDW: 13.9 % (ref 11.5–15.5)
WBC: 12 10*3/uL — ABNORMAL HIGH (ref 4.0–10.5)
nRBC: 0 % (ref 0.0–0.2)

## 2020-02-06 LAB — CMP (CANCER CENTER ONLY)
ALT: 9 U/L (ref 0–44)
AST: 7 U/L — ABNORMAL LOW (ref 15–41)
Albumin: 2.8 g/dL — ABNORMAL LOW (ref 3.5–5.0)
Alkaline Phosphatase: 114 U/L (ref 38–126)
Anion gap: 10 (ref 5–15)
BUN: 26 mg/dL — ABNORMAL HIGH (ref 8–23)
CO2: 22 mmol/L (ref 22–32)
Calcium: 10.2 mg/dL (ref 8.9–10.3)
Chloride: 109 mmol/L (ref 98–111)
Creatinine: 1.6 mg/dL — ABNORMAL HIGH (ref 0.61–1.24)
GFR, Est AFR Am: 52 mL/min — ABNORMAL LOW (ref >60)
GFR, Estimated: 45 mL/min — ABNORMAL LOW (ref >60)
Glucose, Bld: 224 mg/dL — ABNORMAL HIGH (ref 70–99)
Potassium: 3.4 mmol/L — ABNORMAL LOW (ref 3.5–5.1)
Sodium: 141 mmol/L (ref 135–145)
Total Bilirubin: 0.3 mg/dL (ref 0.3–1.2)
Total Protein: 7.1 g/dL (ref 6.5–8.1)

## 2020-02-06 MED ORDER — MONTELUKAST SODIUM 10 MG PO TABS
ORAL_TABLET | ORAL | Status: AC
  Filled 2020-02-06: qty 1

## 2020-02-06 MED ORDER — SODIUM CHLORIDE 0.9 % IV SOLN
40.0000 mg | Freq: Once | INTRAVENOUS | Status: AC
  Administered 2020-02-06: 40 mg via INTRAVENOUS
  Filled 2020-02-06: qty 4

## 2020-02-06 MED ORDER — MEPERIDINE HCL 25 MG/ML IJ SOLN
25.0000 mg | Freq: Once | INTRAMUSCULAR | Status: AC
  Administered 2020-02-06: 25 mg via INTRAVENOUS

## 2020-02-06 MED ORDER — METHYLPREDNISOLONE SODIUM SUCC 125 MG IJ SOLR
INTRAMUSCULAR | Status: AC
  Filled 2020-02-06: qty 2

## 2020-02-06 MED ORDER — DIPHENHYDRAMINE HCL 25 MG PO CAPS
50.0000 mg | ORAL_CAPSULE | Freq: Once | ORAL | Status: AC
  Administered 2020-02-06: 50 mg via ORAL

## 2020-02-06 MED ORDER — SODIUM CHLORIDE 0.9 % IV SOLN
16.0000 mg/kg | Freq: Once | INTRAVENOUS | Status: AC
  Administered 2020-02-06: 1300 mg via INTRAVENOUS
  Filled 2020-02-06: qty 60

## 2020-02-06 MED ORDER — ACETAMINOPHEN 325 MG PO TABS
650.0000 mg | ORAL_TABLET | Freq: Once | ORAL | Status: AC
  Administered 2020-02-06: 650 mg via ORAL

## 2020-02-06 MED ORDER — SODIUM CHLORIDE 0.9 % IV SOLN
Freq: Once | INTRAVENOUS | Status: AC
  Filled 2020-02-06: qty 250

## 2020-02-06 MED ORDER — MEPERIDINE HCL 25 MG/ML IJ SOLN
INTRAMUSCULAR | Status: AC
  Filled 2020-02-06: qty 1

## 2020-02-06 MED ORDER — MONTELUKAST SODIUM 10 MG PO TABS
10.0000 mg | ORAL_TABLET | Freq: Once | ORAL | Status: AC
  Administered 2020-02-06: 10 mg via ORAL

## 2020-02-06 MED ORDER — ACETAMINOPHEN 325 MG PO TABS
ORAL_TABLET | ORAL | Status: AC
  Filled 2020-02-06: qty 2

## 2020-02-06 MED ORDER — METHYLPREDNISOLONE SODIUM SUCC 125 MG IJ SOLR
100.0000 mg | Freq: Once | INTRAMUSCULAR | Status: AC
  Administered 2020-02-06: 100 mg via INTRAVENOUS

## 2020-02-06 MED ORDER — DIPHENHYDRAMINE HCL 25 MG PO CAPS
ORAL_CAPSULE | ORAL | Status: AC
  Filled 2020-02-06: qty 2

## 2020-02-06 NOTE — Patient Instructions (Signed)
[facility] Discharge Instructions for Patients Receiving Chemotherapy  Today you received the following chemotherapy agents: daratumumab.  To help prevent nausea and vomiting after your treatment, we encourage you to take your nausea medication as directed.   If you develop nausea and vomiting that is not controlled by your nausea medication, call the clinic.   BELOW ARE SYMPTOMS THAT SHOULD BE REPORTED IMMEDIATELY:  *FEVER GREATER THAN 100.5 F  *CHILLS WITH OR WITHOUT FEVER  NAUSEA AND VOMITING THAT IS NOT CONTROLLED WITH YOUR NAUSEA MEDICATION  *UNUSUAL SHORTNESS OF BREATH  *UNUSUAL BRUISING OR BLEEDING  TENDERNESS IN MOUTH AND THROAT WITH OR WITHOUT PRESENCE OF ULCERS  *URINARY PROBLEMS  *BOWEL PROBLEMS  UNUSUAL RASH Items with * indicate a potential emergency and should be followed up as soon as possible.  Feel free to call the clinic should you have any questions or concerns. The clinic phone number is [phone].  Please show the CHEMO ALERT CARD at check-in to the Emergency Department and triage nurse.   

## 2020-02-07 LAB — EXTRA LAV TOP TUBE

## 2020-02-07 LAB — URIC ACID: Uric Acid, Serum: 5.1 mg/dL (ref 4.0–8.0)

## 2020-02-07 LAB — ANA: Anti Nuclear Antibody (ANA): NEGATIVE

## 2020-02-07 LAB — RHEUMATOID FACTOR: Rheumatoid fact SerPl-aCnc: 22 IU/mL — ABNORMAL HIGH (ref <14)

## 2020-02-07 LAB — C-REACTIVE PROTEIN: CRP: 114.2 mg/L — ABNORMAL HIGH (ref <8.0)

## 2020-02-07 LAB — CYCLIC CITRUL PEPTIDE ANTIBODY, IGG: Cyclic Citrullin Peptide Ab: <16 UNITS

## 2020-02-09 ENCOUNTER — Other Ambulatory Visit: Payer: Self-pay

## 2020-02-09 ENCOUNTER — Ambulatory Visit (INDEPENDENT_AMBULATORY_CARE_PROVIDER_SITE_OTHER): Payer: No Typology Code available for payment source | Admitting: Endocrinology

## 2020-02-09 ENCOUNTER — Encounter: Payer: Self-pay | Admitting: Endocrinology

## 2020-02-09 VITALS — BP 130/72 | HR 85 | Ht 70.0 in | Wt 179.6 lb

## 2020-02-09 DIAGNOSIS — E1165 Type 2 diabetes mellitus with hyperglycemia: Secondary | ICD-10-CM | POA: Diagnosis not present

## 2020-02-09 MED ORDER — JANUMET XR 100-1000 MG PO TB24: ORAL_TABLET | ORAL | 1 refills | Status: DC

## 2020-02-09 MED ORDER — GLIMEPIRIDE 2 MG PO TABS
ORAL_TABLET | ORAL | 3 refills | Status: DC
Start: 2020-02-09 — End: 2020-03-12

## 2020-02-09 NOTE — Progress Notes (Signed)
Patient ID: [name], male   DOB: Jul 30, 1955, 64 y.o.   MRN: [id number]           Reason for Appointment: Follow-up for Type 2 Diabetes   History of Present Illness:          Date of diagnosis of type 2 diabetes mellitus:   2018      Background history:  He has never been told to have diabetes but review of his previous labs indicate that he had high blood sugar 179 in 2014 His highest sugar has been 234 in 10/2016 when his A1c was 8.8, not clear if he was symptomatic at that time He was recommended referral for diabetes at that time but he did not make an appointment  Recent history:   A1c is 8, highest 8.8  Non-insulin hypoglycemic drugs the patient is taking are: Amaryl 1 mg daily?  Current management, blood sugar patterns and problems identified:  He was having better sugars with Rybelsus sample but he could not get this because he said it was $800  He was told to start Janumet but he says he did not get a prescription at the pharmacy, he did not inquire about this also  He is supposed to be taking Amaryl 1 mg daily and not clear whether he is taking this or not  He only checks blood sugars in the mornings and he said that he can now use his meter from test strips sent by mail order and not from the local store  Still using the Accu-Chek guide  Only checking some readings in the morning sporadically and they are all high except prior to his chemotherapy when lowest reading was 157  Lab glucose 224 fasting this week  Still getting steroids, now prednisone 40 mg with his chemotherapy infusions for 5 days every 2 weeks  He has previously seen the dietitian and supposed to be following this            Glucose monitoring:  Rarely, has Accu-Chek guide meter  Has only 7 readings in the last 14 days  PRE-MEAL Fasting Lunch Dinner Bedtime Overall  Glucose range:  157-356    230   Mean/median:      220    Dietician visit, most recent: 2/21  Weight  history:  Wt Readings from Last 3 Encounters:  02/09/20 179 lb 9.6 oz (81.5 kg)  02/06/20 177 lb 9.6 oz (80.6 kg)  02/02/20 172 lb 4 oz (78.1 kg)    Glycemic control:   Lab Results  Component Value Date   HGBA1C 8.0 (H) 12/20/2019   HGBA1C 7.8 (H) 09/04/2019   HGBA1C 7.9 (H) 05/23/2019   Lab Results  Component Value Date   MICROALBUR 12.6 (H) 07/21/2018   LDLCALC 39 09/04/2019   CREATININE 1.60 (H) 02/06/2020   Lab Results  Component Value Date   MICRALBCREAT 10.0 07/21/2018    Lab Results  Component Value Date   FRUCTOSAMINE 322 (H) 02/02/2020   FRUCTOSAMINE 311 (H) 11/20/2019   FRUCTOSAMINE 322 (H) 03/10/2019    Appointment on 02/06/2020  Component Date Value Ref Range Status  . Sodium 02/06/2020 141  135 - 145 mmol/L Final  . Potassium 02/06/2020 3.4* 3.5 - 5.1 mmol/L Final  . Chloride 02/06/2020 109  98 - 111 mmol/L Final  . CO2 02/06/2020 22  22 - 32 mmol/L Final  . Glucose, Bld 02/06/2020 224* 70 - 99 mg/dL Final   Glucose reference range applies only to samples taken  after fasting for at least 8 hours.  . BUN 02/06/2020 26* 8 - 23 mg/dL Final  . Creatinine 02/06/2020 1.60* 0.61 - 1.24 mg/dL Final  . Calcium 02/06/2020 10.2  8.9 - 10.3 mg/dL Final  . Total Protein 02/06/2020 7.1  6.5 - 8.1 g/dL Final  . Albumin 02/06/2020 2.8* 3.5 - 5.0 g/dL Final  . AST 02/06/2020 7* 15 - 41 U/L Final  . ALT 02/06/2020 9  0 - 44 U/L Final  . Alkaline Phosphatase 02/06/2020 114  38 - 126 U/L Final  . Total Bilirubin 02/06/2020 0.3  0.3 - 1.2 mg/dL Final  . GFR, Est Non Af Am 02/06/2020 45* >60 mL/min Final  . GFR, Est AFR Am 02/06/2020 52* >60 mL/min Final  . Anion gap 02/06/2020 10  5 - 15 Final   Performed at [facility], [place] [address], [place], [place] [address]  . WBC 02/06/2020 12.0* 4.0 - 10.5 K/uL Final  . RBC 02/06/2020 3.79* 4.22 - 5.81 MIL/uL Final  . Hemoglobin 02/06/2020 9.5* 13.0 - 17.0 g/dL Final  . HCT 02/06/2020 30.0* 39 - 52 %  Final  . MCV 02/06/2020 79.2* 80.0 - 100.0 fL Final  . MCH 02/06/2020 25.1* 26.0 - 34.0 pg Final  . MCHC 02/06/2020 31.7  30.0 - 36.0 g/dL Final  . RDW 02/06/2020 13.9  11.5 - 15.5 % Final  . Platelets 02/06/2020 432* 150 - 400 K/uL Final  . nRBC 02/06/2020 0.0  0.0 - 0.2 % Final  . Neutrophils Relative % 02/06/2020 82  % Final  . Neutro Abs 02/06/2020 9.8* 1.7 - 7.7 K/uL Final  . Lymphocytes Relative 02/06/2020 11  % Final  . Lymphs Abs 02/06/2020 1.3  0.7 - 4.0 K/uL Final  . Monocytes Relative 02/06/2020 6  % Final  . Monocytes Absolute 02/06/2020 0.8  0 - 1 K/uL Final  . Eosinophils Relative 02/06/2020 0  % Final  . Eosinophils Absolute 02/06/2020 0.0  0 - 0 K/uL Final  . Basophils Relative 02/06/2020 0  % Final  . Basophils Absolute 02/06/2020 0.0  0 - 0 K/uL Final  . Immature Granulocytes 02/06/2020 1  % Final  . Abs Immature Granulocytes 02/06/2020 0.12* 0.00 - 0.07 K/uL Final   Performed at [facility], [place] [address], [place], [place] [address]    Allergies as of 02/09/2020   No Known Allergies     Medication List       Accurate as of February 09, 2020 11:59 PM. If you have any questions, ask your nurse or doctor.        Accu-Chek Guide Me w/Device Kit 1 each by Does not apply route.   Accu-Chek Guide test strip Generic drug: glucose blood Use Accu Chek test strips as instructed to check blood sugar fasting or two hours after meals, every other day.   Accu-Chek Softclix Lancets lancets Use Accu Chek softclix lancets to check blood sugar fasting or 2 hours after a meal every other day.   acyclovir 400 MG tablet Commonly known as: ZOVIRAX Take 0.5 tablets (200 mg total) by mouth 2 (two) times daily.   amLODipine 5 MG tablet Commonly known as: NORVASC Take 1 tablet (5 mg total) by mouth daily.   aspirin 325 MG EC tablet Take 81 mg by mouth daily.   atorvastatin 80 MG tablet Commonly known as: LIPITOR Take 1 tablet (80 mg total) by  mouth daily.   carvedilol 25 MG tablet Commonly known as:  COREG Take 1 tablet (25 mg total) by mouth 2 (two) times daily with a meal.   dexamethasone 4 MG tablet Commonly known as: DECADRON 3 tabs ('12mg'$ ) orally with breakfast the day after each Daratumumab treatment.   glimepiride 2 MG tablet Commonly known as: AMARYL I tab in am daily. On week of chemo take twice daily What changed:   medication strength  how much to take  how to take this  when to take this  additional instructions Changed by: Elayne Snare, MD   hydrALAZINE 50 MG tablet Commonly known as: APRESOLINE TAKE 1 TABLET BY MOUTH THREE TIMES DAILY   ibuprofen 200 MG tablet Commonly known as: ADVIL Take 200 mg by mouth every 6 (six) hours as needed for moderate pain (for knee).   isosorbide mononitrate 30 MG 24 hr tablet Commonly known as: IMDUR Take 1 tablet by mouth once daily   Janumet XR [phone] MG Tb24 Generic drug: SitaGLIPtin-MetFORMIN HCl 1 tablet daily and dinnertime   losartan 100 MG tablet Commonly known as: COZAAR Take 1 tablet by mouth once daily   ondansetron 8 MG tablet Commonly known as: Zofran Take 1 tablet (8 mg total) by mouth 2 (two) times daily as needed (Nausea or vomiting).   predniSONE 20 MG tablet Commonly known as: DELTASONE Take two tablets once daily for 5 days.   prochlorperazine 10 MG tablet Commonly known as: COMPAZINE Take 1 tablet (10 mg total) by mouth every 6 (six) hours as needed (Nausea or vomiting).   Rybelsus 7 MG Tabs Generic drug: Semaglutide Take 1 tablet by mouth daily before breakfast. Take 30 minutes before breakfast with water       Allergies: No Known Allergies  Past Medical History:  Diagnosis Date  . Arthritis    "left knee" (11/24/2017)  . CAD (coronary artery disease)    CABG 2002  . CHF (congestive heart failure) ([place])   . Chronic renal insufficiency   . Diabetes mellitus without complication ([place])   . Dilated cardiomyopathy ([place])     echo in 2009 showed improvement with a normal EF  . HTN (hypertension)   . Hyperlipemia   . Noncompliance   . PAD (peripheral artery disease) ([place])     Past Surgical History:  Procedure Laterality Date  . CARDIAC CATHETERIZATION  2002, 2006  . CORONARY ARTERY BYPASS GRAFT  2002   x4 DR. VAN TRIGT  . IR FLUORO GUIDED NEEDLE PLC ASPIRATION/INJECTION LOC  10/24/2019    Family History  Problem Relation Age of Onset  . Hypertension Father   . Diabetes Mother        DIABETIC COMA    Social History:  reports that he has never smoked. He has never used smokeless tobacco. He reports that he does not drink alcohol and does not use drugs.   Review of Systems   Lipid history: On atorvastatin 80 mg since diagnosis of CAD, labs as follows    Lab Results  Component Value Date   CHOL 92 (L) 09/04/2019   HDL 35 (L) 09/04/2019   LDLCALC 39 09/04/2019   LDLDIRECT 149.4 01/07/2011   TRIG 89 09/04/2019   CHOLHDL 2.6 09/04/2019           Hypertension: Has been followed by cardiologist for blood pressure control Recent readings  BP Readings from Last 3 Encounters:  02/09/20 (!) 130/72  02/06/20 (!) 154/89  02/06/20 (!) 137/88   RENAL dysfunction is about the same, still moderately impaired Needs urine microalbumin followed up  Lab Results  Component Value Date   CREATININE 1.60 (H) 02/06/2020   CREATININE 1.59 (H) 01/23/2020   CREATININE 1.64 (H) 01/09/2020    Most recent eye exam was in 2018  Most recent foot exam: 07/2018  Currently known complications of diabetes:none  LABS:  Appointment on 02/06/2020  Component Date Value Ref Range Status  . Sodium 02/06/2020 141  135 - 145 mmol/L Final  . Potassium 02/06/2020 3.4* 3.5 - 5.1 mmol/L Final  . Chloride 02/06/2020 109  98 - 111 mmol/L Final  . CO2 02/06/2020 22  22 - 32 mmol/L Final  . Glucose, Bld 02/06/2020 224* 70 - 99 mg/dL Final   Glucose reference range applies only to samples taken after fasting for at least  8 hours.  . BUN 02/06/2020 26* 8 - 23 mg/dL Final  . Creatinine 02/06/2020 1.60* 0.61 - 1.24 mg/dL Final  . Calcium 02/06/2020 10.2  8.9 - 10.3 mg/dL Final  . Total Protein 02/06/2020 7.1  6.5 - 8.1 g/dL Final  . Albumin 02/06/2020 2.8* 3.5 - 5.0 g/dL Final  . AST 02/06/2020 7* 15 - 41 U/L Final  . ALT 02/06/2020 9  0 - 44 U/L Final  . Alkaline Phosphatase 02/06/2020 114  38 - 126 U/L Final  . Total Bilirubin 02/06/2020 0.3  0.3 - 1.2 mg/dL Final  . GFR, Est Non Af Am 02/06/2020 45* >60 mL/min Final  . GFR, Est AFR Am 02/06/2020 52* >60 mL/min Final  . Anion gap 02/06/2020 10  5 - 15 Final   Performed at [facility], [place] [address], [place], [place] [address]  . WBC 02/06/2020 12.0* 4.0 - 10.5 K/uL Final  . RBC 02/06/2020 3.79* 4.22 - 5.81 MIL/uL Final  . Hemoglobin 02/06/2020 9.5* 13.0 - 17.0 g/dL Final  . HCT 02/06/2020 30.0* 39 - 52 % Final  . MCV 02/06/2020 79.2* 80.0 - 100.0 fL Final  . MCH 02/06/2020 25.1* 26.0 - 34.0 pg Final  . MCHC 02/06/2020 31.7  30.0 - 36.0 g/dL Final  . RDW 02/06/2020 13.9  11.5 - 15.5 % Final  . Platelets 02/06/2020 432* 150 - 400 K/uL Final  . nRBC 02/06/2020 0.0  0.0 - 0.2 % Final  . Neutrophils Relative % 02/06/2020 82  % Final  . Neutro Abs 02/06/2020 9.8* 1.7 - 7.7 K/uL Final  . Lymphocytes Relative 02/06/2020 11  % Final  . Lymphs Abs 02/06/2020 1.3  0.7 - 4.0 K/uL Final  . Monocytes Relative 02/06/2020 6  % Final  . Monocytes Absolute 02/06/2020 0.8  0 - 1 K/uL Final  . Eosinophils Relative 02/06/2020 0  % Final  . Eosinophils Absolute 02/06/2020 0.0  0 - 0 K/uL Final  . Basophils Relative 02/06/2020 0  % Final  . Basophils Absolute 02/06/2020 0.0  0 - 0 K/uL Final  . Immature Granulocytes 02/06/2020 1  % Final  . Abs Immature Granulocytes 02/06/2020 0.12* 0.00 - 0.07 K/uL Final   Performed at [facility], [place] [address], [place], [place] [address]    Physical Examination:  BP (!) 130/72  (BP Location: Left Arm, Patient Position: Sitting, Cuff Size: Large)   Pulse 85   Ht '5\' 10"'$  (1.778 m)   Wt 179 lb 9.6 oz (81.5 kg)   SpO2 95%   BMI 25.77 kg/m          ASSESSMENT:  Diabetes type 2, mild  See history of present illness for detailed discussion of current diabetes  management, blood sugar patterns and problems identified  A1c last 8.0 in June and fructosamine previously 311  He may have had some better readings with Rybelsus but currently blood sugars are much higher with getting recurrent doses of prednisone for chemotherapy Also now is not able to afford Rybelsus which he took with a sample Not clear if he is taking Amaryl that was prescribed also Blood sugar monitoring has been minimal although he has finally started doing some He has variable compliance with cutting back on high fat foods   PLAN:    1. Glucose monitoring: He will start checking blood sugars daily alternating morning and evening/after meals When he has chemotherapy he will check his blood sugars 3 times a day Discussed blood sugar targets   2.    Medication recommendations He will be changed to Amaryl 2 mg daily and he will take this in the mornings When he has chemotherapy he will take this twice a day Although likely he needs to be on insulin on the days he has chemotherapy at least he is very reluctant to start this Since Rybelsus is not affordable he will at least try Janumet XR with reduce Metformin dose to only 1000 mg daily which should be adequate for his renal function Again sent a prescription which somehow he did not pick up previously Have shown him his new medication list to make sure he is taking them  Need short-term follow-up to consider insulin if needed on the future visit  Also consider follow-up consultation with diabetes educator  3.  Low-fat diet To try and walk as much as possible  Follow-up in 6 weeks  There are no Patient Instructions on file for this  visit.     Elayne Snare 02/10/2020, 5:01 PM   Note: This office note was prepared with Dragon voice recognition system technology. Any transcriptional errors that result from this process are unintentional.   Elayne Snare

## 2020-02-14 ENCOUNTER — Telehealth: Payer: Self-pay

## 2020-02-14 NOTE — Telephone Encounter (Signed)
Received notification from [facility] would cost $430.00/mo which is not affordable for pt. Also requesting an alternative. Per Dr. [name], no alternative will be needed. Continue Glimepiride and consider insulin as they previously discussed. If interested, will need insulin training with educator.  Called pt and LVM. Pt immediately returned my call. Informed pt about Dr. Ronnie Derby recommendations above. Pt declined insulin and expressed understanding that he will remain on Glimepiride. Also understood that no alternative is being recommended by Dr. [name]. Advised to call is he had any issues or concerns. Verbalized acceptance and understanding.

## 2020-02-20 ENCOUNTER — Other Ambulatory Visit: Payer: No Typology Code available for payment source

## 2020-02-20 ENCOUNTER — Ambulatory Visit: Payer: No Typology Code available for payment source

## 2020-02-20 ENCOUNTER — Inpatient Hospital Stay: Payer: PRIVATE HEALTH INSURANCE

## 2020-02-20 ENCOUNTER — Other Ambulatory Visit: Payer: Self-pay

## 2020-02-20 ENCOUNTER — Inpatient Hospital Stay: Payer: PRIVATE HEALTH INSURANCE | Attending: Hematology

## 2020-02-20 VITALS — BP 143/82 | HR 71 | Temp 97.8°F | Resp 18 | Ht 70.0 in | Wt 177.0 lb

## 2020-02-20 DIAGNOSIS — Z79899 Other long term (current) drug therapy: Secondary | ICD-10-CM | POA: Insufficient documentation

## 2020-02-20 DIAGNOSIS — E119 Type 2 diabetes mellitus without complications: Secondary | ICD-10-CM | POA: Insufficient documentation

## 2020-02-20 DIAGNOSIS — C9000 Multiple myeloma not having achieved remission: Secondary | ICD-10-CM | POA: Diagnosis not present

## 2020-02-20 DIAGNOSIS — R634 Abnormal weight loss: Secondary | ICD-10-CM | POA: Insufficient documentation

## 2020-02-20 DIAGNOSIS — I2510 Atherosclerotic heart disease of native coronary artery without angina pectoris: Secondary | ICD-10-CM | POA: Diagnosis not present

## 2020-02-20 DIAGNOSIS — Z7952 Long term (current) use of systemic steroids: Secondary | ICD-10-CM | POA: Diagnosis not present

## 2020-02-20 DIAGNOSIS — E785 Hyperlipidemia, unspecified: Secondary | ICD-10-CM | POA: Insufficient documentation

## 2020-02-20 DIAGNOSIS — Z951 Presence of aortocoronary bypass graft: Secondary | ICD-10-CM | POA: Insufficient documentation

## 2020-02-20 DIAGNOSIS — M25519 Pain in unspecified shoulder: Secondary | ICD-10-CM | POA: Insufficient documentation

## 2020-02-20 DIAGNOSIS — Z5112 Encounter for antineoplastic immunotherapy: Secondary | ICD-10-CM | POA: Diagnosis not present

## 2020-02-20 DIAGNOSIS — Z7189 Other specified counseling: Secondary | ICD-10-CM

## 2020-02-20 DIAGNOSIS — D649 Anemia, unspecified: Secondary | ICD-10-CM

## 2020-02-20 DIAGNOSIS — I10 Essential (primary) hypertension: Secondary | ICD-10-CM | POA: Insufficient documentation

## 2020-02-20 LAB — CBC WITH DIFFERENTIAL/PLATELET
Abs Immature Granulocytes: 0.03 10*3/uL (ref 0.00–0.07)
Basophils Absolute: 0 10*3/uL (ref 0.0–0.1)
Basophils Relative: 0 %
Eosinophils Absolute: 0 10*3/uL (ref 0.0–0.5)
Eosinophils Relative: 0 %
HCT: 28.2 % — ABNORMAL LOW (ref 39.0–52.0)
Hemoglobin: 8.8 g/dL — ABNORMAL LOW (ref 13.0–17.0)
Immature Granulocytes: 1 %
Lymphocytes Relative: 8 %
Lymphs Abs: 0.5 10*3/uL — ABNORMAL LOW (ref 0.7–4.0)
MCH: 24.6 pg — ABNORMAL LOW (ref 26.0–34.0)
MCHC: 31.2 g/dL (ref 30.0–36.0)
MCV: 79 fL — ABNORMAL LOW (ref 80.0–100.0)
Monocytes Absolute: 0.1 10*3/uL (ref 0.1–1.0)
Monocytes Relative: 2 %
Neutro Abs: 5.6 10*3/uL (ref 1.7–7.7)
Neutrophils Relative %: 89 %
Platelets: 322 10*3/uL (ref 150–400)
RBC: 3.57 MIL/uL — ABNORMAL LOW (ref 4.22–5.81)
RDW: 15.6 % — ABNORMAL HIGH (ref 11.5–15.5)
WBC: 6.3 10*3/uL (ref 4.0–10.5)
nRBC: 0 % (ref 0.0–0.2)

## 2020-02-20 LAB — CMP (CANCER CENTER ONLY)
ALT: <6 U/L (ref 0–44)
AST: <6 U/L — ABNORMAL LOW (ref 15–41)
Albumin: 2.8 g/dL — ABNORMAL LOW (ref 3.5–5.0)
Alkaline Phosphatase: 88 U/L (ref 38–126)
Anion gap: 12 (ref 5–15)
BUN: 23 mg/dL (ref 8–23)
CO2: 23 mmol/L (ref 22–32)
Calcium: 9.5 mg/dL (ref 8.9–10.3)
Chloride: 107 mmol/L (ref 98–111)
Creatinine: 1.62 mg/dL — ABNORMAL HIGH (ref 0.61–1.24)
GFR, Est AFR Am: 51 mL/min — ABNORMAL LOW (ref >60)
GFR, Estimated: 44 mL/min — ABNORMAL LOW (ref >60)
Glucose, Bld: 375 mg/dL — ABNORMAL HIGH (ref 70–99)
Potassium: 3.8 mmol/L (ref 3.5–5.1)
Sodium: 142 mmol/L (ref 135–145)
Total Bilirubin: 0.4 mg/dL (ref 0.3–1.2)
Total Protein: 7 g/dL (ref 6.5–8.1)

## 2020-02-20 MED ORDER — METHYLPREDNISOLONE SODIUM SUCC 125 MG IJ SOLR
100.0000 mg | Freq: Once | INTRAMUSCULAR | Status: AC
  Administered 2020-02-20: 100 mg via INTRAVENOUS

## 2020-02-20 MED ORDER — DIPHENHYDRAMINE HCL 25 MG PO CAPS
ORAL_CAPSULE | ORAL | Status: AC
  Filled 2020-02-20: qty 2

## 2020-02-20 MED ORDER — SODIUM CHLORIDE 0.9 % IV SOLN
40.0000 mg | Freq: Once | INTRAVENOUS | Status: AC
  Administered 2020-02-20: 40 mg via INTRAVENOUS
  Filled 2020-02-20: qty 4

## 2020-02-20 MED ORDER — MONTELUKAST SODIUM 10 MG PO TABS
10.0000 mg | ORAL_TABLET | Freq: Once | ORAL | Status: AC
  Administered 2020-02-20: 10 mg via ORAL

## 2020-02-20 MED ORDER — MONTELUKAST SODIUM 10 MG PO TABS
ORAL_TABLET | ORAL | Status: AC
  Filled 2020-02-20: qty 1

## 2020-02-20 MED ORDER — MEPERIDINE HCL 25 MG/ML IJ SOLN
25.0000 mg | Freq: Once | INTRAMUSCULAR | Status: AC
  Administered 2020-02-20: 25 mg via INTRAVENOUS

## 2020-02-20 MED ORDER — MEPERIDINE HCL 25 MG/ML IJ SOLN
INTRAMUSCULAR | Status: AC
  Filled 2020-02-20: qty 1

## 2020-02-20 MED ORDER — SODIUM CHLORIDE 0.9 % IV SOLN
Freq: Once | INTRAVENOUS | Status: AC
  Filled 2020-02-20: qty 250

## 2020-02-20 MED ORDER — FAMOTIDINE IN NACL 20-0.9 MG/50ML-% IV SOLN
INTRAVENOUS | Status: AC
  Filled 2020-02-20: qty 50

## 2020-02-20 MED ORDER — SODIUM CHLORIDE 0.9 % IV SOLN
16.0000 mg/kg | Freq: Once | INTRAVENOUS | Status: AC
  Administered 2020-02-20: 1300 mg via INTRAVENOUS
  Filled 2020-02-20: qty 60

## 2020-02-20 MED ORDER — ACETAMINOPHEN 325 MG PO TABS
650.0000 mg | ORAL_TABLET | Freq: Once | ORAL | Status: AC
  Administered 2020-02-20: 650 mg via ORAL

## 2020-02-20 MED ORDER — ACETAMINOPHEN 325 MG PO TABS
ORAL_TABLET | ORAL | Status: AC
  Filled 2020-02-20: qty 2

## 2020-02-20 MED ORDER — METHYLPREDNISOLONE SODIUM SUCC 125 MG IJ SOLR
INTRAMUSCULAR | Status: AC
  Filled 2020-02-20: qty 2

## 2020-02-20 MED ORDER — DIPHENHYDRAMINE HCL 25 MG PO CAPS
50.0000 mg | ORAL_CAPSULE | Freq: Once | ORAL | Status: AC
  Administered 2020-02-20: 50 mg via ORAL

## 2020-02-20 NOTE — Progress Notes (Signed)
Per Dr. Irene Limbo: okay to treat with Scr. of 1.62

## 2020-02-20 NOTE — Patient Instructions (Signed)
[facility] Discharge Instructions for Patients Receiving Chemotherapy  Today you received the following chemotherapy agents Daratumumab (DARZALEX).  To help prevent nausea and vomiting after your treatment, we encourage you to take your nausea medication as prescribed.   If you develop nausea and vomiting that is not controlled by your nausea medication, call the clinic.   BELOW ARE SYMPTOMS THAT SHOULD BE REPORTED IMMEDIATELY:  *FEVER GREATER THAN 100.5 F  *CHILLS WITH OR WITHOUT FEVER  NAUSEA AND VOMITING THAT IS NOT CONTROLLED WITH YOUR NAUSEA MEDICATION  *UNUSUAL SHORTNESS OF BREATH  *UNUSUAL BRUISING OR BLEEDING  TENDERNESS IN MOUTH AND THROAT WITH OR WITHOUT PRESENCE OF ULCERS  *URINARY PROBLEMS  *BOWEL PROBLEMS  UNUSUAL RASH Items with * indicate a potential emergency and should be followed up as soon as possible.  Feel free to call the clinic should you have any questions or concerns. The clinic phone number is [phone].  Please show the CHEMO ALERT CARD at check-in to the Emergency Department and triage nurse.   

## 2020-02-21 ENCOUNTER — Other Ambulatory Visit: Payer: Self-pay | Admitting: Family Medicine

## 2020-02-21 ENCOUNTER — Other Ambulatory Visit: Payer: Self-pay | Admitting: Nurse Practitioner

## 2020-02-21 ENCOUNTER — Telehealth: Payer: Self-pay

## 2020-02-21 NOTE — Telephone Encounter (Signed)
Received call from [place] to confirm if patient received chemo treatment yesterday 02/20/2020, confirmed with insurance representative that patient did receive treatment.

## 2020-02-23 ENCOUNTER — Telehealth: Payer: Self-pay | Admitting: Hematology

## 2020-02-23 LAB — MULTIPLE MYELOMA PANEL, SERUM
Albumin SerPl Elph-Mcnc: 2.9 g/dL (ref 2.9–4.4)
Albumin/Glob SerPl: 0.9 (ref 0.7–1.7)
Alpha 1: 0.3 g/dL (ref 0.0–0.4)
Alpha2 Glob SerPl Elph-Mcnc: 1.2 g/dL — ABNORMAL HIGH (ref 0.4–1.0)
B-Globulin SerPl Elph-Mcnc: 0.9 g/dL (ref 0.7–1.3)
Gamma Glob SerPl Elph-Mcnc: 1.2 g/dL (ref 0.4–1.8)
Globulin, Total: 3.5 g/dL (ref 2.2–3.9)
IgA: 55 mg/dL — ABNORMAL LOW (ref 61–437)
IgG (Immunoglobin G), Serum: 1231 mg/dL (ref 603–1613)
IgM (Immunoglobulin M), Srm: 35 mg/dL (ref 20–172)
M Protein SerPl Elph-Mcnc: 0.5 g/dL — ABNORMAL HIGH
Total Protein ELP: 6.4 g/dL (ref 6.0–8.5)

## 2020-02-23 NOTE — Telephone Encounter (Signed)
Scheduled per 07/27 los, patient has been called and voicemail was left.

## 2020-03-01 ENCOUNTER — Other Ambulatory Visit: Payer: No Typology Code available for payment source

## 2020-03-01 ENCOUNTER — Ambulatory Visit: Payer: No Typology Code available for payment source | Admitting: Hematology

## 2020-03-03 ENCOUNTER — Encounter: Payer: Self-pay | Admitting: Internal Medicine

## 2020-03-05 ENCOUNTER — Inpatient Hospital Stay: Payer: PRIVATE HEALTH INSURANCE

## 2020-03-05 ENCOUNTER — Telehealth: Payer: Self-pay | Admitting: *Deleted

## 2020-03-05 ENCOUNTER — Inpatient Hospital Stay (HOSPITAL_BASED_OUTPATIENT_CLINIC_OR_DEPARTMENT_OTHER): Payer: PRIVATE HEALTH INSURANCE | Admitting: Hematology

## 2020-03-05 ENCOUNTER — Other Ambulatory Visit: Payer: Self-pay | Admitting: Hematology

## 2020-03-05 ENCOUNTER — Other Ambulatory Visit: Payer: No Typology Code available for payment source

## 2020-03-05 ENCOUNTER — Other Ambulatory Visit: Payer: Self-pay

## 2020-03-05 VITALS — BP 148/70 | HR 68 | Temp 98.0°F | Resp 17 | Wt 178.2 lb

## 2020-03-05 DIAGNOSIS — C9000 Multiple myeloma not having achieved remission: Secondary | ICD-10-CM

## 2020-03-05 DIAGNOSIS — Z5112 Encounter for antineoplastic immunotherapy: Secondary | ICD-10-CM

## 2020-03-05 DIAGNOSIS — D649 Anemia, unspecified: Secondary | ICD-10-CM

## 2020-03-05 DIAGNOSIS — Z7189 Other specified counseling: Secondary | ICD-10-CM

## 2020-03-05 DIAGNOSIS — M1990 Unspecified osteoarthritis, unspecified site: Secondary | ICD-10-CM

## 2020-03-05 LAB — CBC WITH DIFFERENTIAL/PLATELET
Abs Immature Granulocytes: 0.04 10*3/uL (ref 0.00–0.07)
Basophils Absolute: 0 10*3/uL (ref 0.0–0.1)
Basophils Relative: 0 %
Eosinophils Absolute: 0 10*3/uL (ref 0.0–0.5)
Eosinophils Relative: 0 %
HCT: 27.9 % — ABNORMAL LOW (ref 39.0–52.0)
Hemoglobin: 8.7 g/dL — ABNORMAL LOW (ref 13.0–17.0)
Immature Granulocytes: 1 %
Lymphocytes Relative: 15 %
Lymphs Abs: 1.2 10*3/uL (ref 0.7–4.0)
MCH: 24.6 pg — ABNORMAL LOW (ref 26.0–34.0)
MCHC: 31.2 g/dL (ref 30.0–36.0)
MCV: 79 fL — ABNORMAL LOW (ref 80.0–100.0)
Monocytes Absolute: 0.8 10*3/uL (ref 0.1–1.0)
Monocytes Relative: 10 %
Neutro Abs: 6 10*3/uL (ref 1.7–7.7)
Neutrophils Relative %: 74 %
Platelets: 309 10*3/uL (ref 150–400)
RBC: 3.53 MIL/uL — ABNORMAL LOW (ref 4.22–5.81)
RDW: 17.4 % — ABNORMAL HIGH (ref 11.5–15.5)
WBC: 8 10*3/uL (ref 4.0–10.5)
nRBC: 0 % (ref 0.0–0.2)

## 2020-03-05 LAB — CMP (CANCER CENTER ONLY)
ALT: <6 U/L (ref 0–44)
AST: <6 U/L — ABNORMAL LOW (ref 15–41)
Albumin: 2.9 g/dL — ABNORMAL LOW (ref 3.5–5.0)
Alkaline Phosphatase: 87 U/L (ref 38–126)
Anion gap: 10 (ref 5–15)
BUN: 19 mg/dL (ref 8–23)
CO2: 24 mmol/L (ref 22–32)
Calcium: 9.8 mg/dL (ref 8.9–10.3)
Chloride: 107 mmol/L (ref 98–111)
Creatinine: 1.56 mg/dL — ABNORMAL HIGH (ref 0.61–1.24)
GFR, Est AFR Am: 54 mL/min — ABNORMAL LOW (ref >60)
GFR, Estimated: 46 mL/min — ABNORMAL LOW (ref >60)
Glucose, Bld: 165 mg/dL — ABNORMAL HIGH (ref 70–99)
Potassium: 3.1 mmol/L — ABNORMAL LOW (ref 3.5–5.1)
Sodium: 141 mmol/L (ref 135–145)
Total Bilirubin: 0.4 mg/dL (ref 0.3–1.2)
Total Protein: 7.3 g/dL (ref 6.5–8.1)

## 2020-03-05 MED ORDER — MONTELUKAST SODIUM 10 MG PO TABS
10.0000 mg | ORAL_TABLET | Freq: Once | ORAL | Status: AC
  Administered 2020-03-05: 10 mg via ORAL

## 2020-03-05 MED ORDER — SODIUM CHLORIDE 0.9 % IV SOLN
16.0000 mg/kg | Freq: Once | INTRAVENOUS | Status: AC
  Administered 2020-03-05: 1300 mg via INTRAVENOUS
  Filled 2020-03-05: qty 60

## 2020-03-05 MED ORDER — SODIUM CHLORIDE 0.9 % IV SOLN
Freq: Once | INTRAVENOUS | Status: AC
  Filled 2020-03-05: qty 250

## 2020-03-05 MED ORDER — SODIUM CHLORIDE 0.9 % IV SOLN
40.0000 mg | Freq: Once | INTRAVENOUS | Status: DC
  Filled 2020-03-05: qty 4

## 2020-03-05 MED ORDER — METHYLPREDNISOLONE SODIUM SUCC 125 MG IJ SOLR
100.0000 mg | Freq: Once | INTRAMUSCULAR | Status: AC
  Administered 2020-03-05: 100 mg via INTRAVENOUS

## 2020-03-05 MED ORDER — SODIUM CHLORIDE 0.9 % IV SOLN
40.0000 mg | Freq: Once | INTRAVENOUS | Status: AC
  Administered 2020-03-05: 40 mg via INTRAVENOUS
  Filled 2020-03-05: qty 4

## 2020-03-05 MED ORDER — MONTELUKAST SODIUM 10 MG PO TABS
ORAL_TABLET | ORAL | Status: AC
  Filled 2020-03-05: qty 1

## 2020-03-05 MED ORDER — ACETAMINOPHEN 325 MG PO TABS
ORAL_TABLET | ORAL | Status: AC
  Filled 2020-03-05: qty 1

## 2020-03-05 MED ORDER — ACETAMINOPHEN 325 MG PO TABS
ORAL_TABLET | ORAL | Status: AC
  Filled 2020-03-05: qty 2

## 2020-03-05 MED ORDER — ACETAMINOPHEN 325 MG PO TABS
650.0000 mg | ORAL_TABLET | Freq: Once | ORAL | Status: AC
  Administered 2020-03-05: 650 mg via ORAL

## 2020-03-05 MED ORDER — DIPHENHYDRAMINE HCL 25 MG PO CAPS
50.0000 mg | ORAL_CAPSULE | Freq: Once | ORAL | Status: AC
  Administered 2020-03-05: 50 mg via ORAL

## 2020-03-05 MED ORDER — DIPHENHYDRAMINE HCL 25 MG PO CAPS
ORAL_CAPSULE | ORAL | Status: AC
  Filled 2020-03-05: qty 2

## 2020-03-05 MED ORDER — MEPERIDINE HCL 25 MG/ML IJ SOLN
INTRAMUSCULAR | Status: AC
  Filled 2020-03-05: qty 1

## 2020-03-05 MED ORDER — MEPERIDINE HCL 25 MG/ML IJ SOLN
25.0000 mg | Freq: Once | INTRAMUSCULAR | Status: AC
  Administered 2020-03-05: 25 mg via INTRAVENOUS

## 2020-03-05 MED ORDER — DIPHENHYDRAMINE HCL 25 MG PO CAPS
ORAL_CAPSULE | ORAL | Status: AC
  Filled 2020-03-05: qty 1

## 2020-03-05 MED ORDER — METHYLPREDNISOLONE SODIUM SUCC 125 MG IJ SOLR
INTRAMUSCULAR | Status: AC
  Filled 2020-03-05: qty 2

## 2020-03-05 NOTE — Telephone Encounter (Signed)
OK to treat with Creatinine 1.56

## 2020-03-05 NOTE — Progress Notes (Addendum)
HEMATOLOGY/ONCOLOGY CONSULTATION NOTE  Date of Service: 03/05/2020  Patient Care Team: Isaac Bliss, Rayford Halsted, MD as PCP - General (Internal Medicine) Martinique, Peter M, MD as PCP - Cardiology (Cardiology)  CHIEF COMPLAINTS/PURPOSE OF CONSULTATION:  Recently diagnosed myeloma  HISTORY OF PRESENTING ILLNESS:  [name] is a wonderful 64 y.o. male who has been referred to [place] by Dr Servando Snare for evaluation and management of elevated protein. Pt is accompanied today by his wife, Mrs. Terhune. The pt reports that he is doing well overall.    The pt reports he was first told that he had Diabetes two years ago. He has not previously needed to be on medications for his diabetes but has recently been started on Januvia by Dr. Elayne Snare, his Endocrinologist. His blood glucose was 149 this morning. He sees Burtis Junes - NP and Dr. Martinique for Cardiology. Pt has CAD and had to have open heart surgery 20 years ago. He also has HTN that he feels has been stable. His Cardiology team is currently managing his heart medications and recently told pt to discontinue taking Furosemide due kidney function on last labs. Dr. Martinique and Truitt Merle have been essentially functioning as his primary care, as he does not have a PCP at this time. He has an upcoming appointment with Truitt Merle in the beginning of May and Dr. [name] next Tuesday. Pt is currently using Ibuprofen a couple of times per week for his knee pain.   Pt has felt the same in the last 3-6 months and denies any new bone pain of any kinds. He is eating, functioning, and moving about as normal. He has lost about 13 lbs over the last few months. Pt does not smoke and does not drink much alcohol.    Most recent lab results (09/04/2019) of CBC, BMP and Hepatic function panel is as follows: all values are WNL except for RBC at 3.64, Hgb at 9.8, HCT at 29.8, Glucose at 129, BUN at 64, Creatinine at 2.62, GFR Est Af Am at 29, CO2 at 15,  Total Protein at 10.0, Albumin at 3.6, ALP at 163, ALT at 45.    On review of systems, pt reports unexpected weight loss, left knee pain and denies new back pain, new shoulder pain, new hip pain, chest pain, SOB, leg swelling, testicular pain/swelling and any other symptoms.    On PMHx the pt reports Left Knee Arthritis, CAD, CHF, Type II Diabetes, HTN, HLD, Cardiac Catheterization, Coronary Artery Bypass Graft x4 (2002). On Social Hx the pt reports that he is a non-smoker and does not drink much alcohol.  INTERVAL HISTORY:  [name] is a wonderful 64 y.o. male is here for evaluation and management of newly diagnosed myeloma. He is here for C5D1 Daratumumab. The patient's last visit with [place] was on 02/06/2020. The pt reports that he is doing well overall.  The pt reports that he is experiencing right hand and arm swelling and pain due to Gout. His Gout diagnosis was not confirmed via labwork. He was prescribed a short course of Prednisone, which helped temporarily. He has been experiencing other joint pain intermittently as well. Pt currently has an appointment with a Rheumatologist scheduled for January 2022.   He denies any issues with his current treatment regimen or any new symptoms.   Lab results today (03/05/20) of CBC w/diff and CMP is as follows: all values are WNL except for RBC at 3.53, Hgb at 8.7, HCT at  27.9, MCV at 79.0, MCH at 24.6, RDW at 17.4, Potassium at 3.1, Glucose at 165, Creatinine at 1.56, Albumin at 2.9, AST at <6, GFR Est Af Am at 54.  On review of systems, pt reports right extremity pain/swelling, joint pain and denies SOB, new bone pain and any other symptoms.   MEDICAL HISTORY:  Past Medical History:  Diagnosis Date   Arthritis    "left knee" (11/24/2017)   CAD (coronary artery disease)    CABG 2002   CHF (congestive heart failure) (HCC)    Chronic renal insufficiency    Diabetes mellitus without complication ([place])    Dilated cardiomyopathy ([place])     echo in 2009 showed improvement with a normal EF   HTN (hypertension)    Hyperlipemia    Noncompliance    PAD (peripheral artery disease) ([place])     SURGICAL HISTORY: Past Surgical History:  Procedure Laterality Date   CARDIAC CATHETERIZATION  2002, 2006   CORONARY ARTERY BYPASS GRAFT  2002   x4 DR. VAN TRIGT   IR FLUORO GUIDED NEEDLE PLC ASPIRATION/INJECTION LOC  10/24/2019    SOCIAL HISTORY: Social History   Socioeconomic History   Marital status: Married    Spouse name: Not on file   Number of children: 1   Years of education: Not on file   Highest education level: Not on file  Occupational History   Occupation: Furniture conservator/restorer  Tobacco Use   Smoking status: Never Smoker   Smokeless tobacco: Never Used  Scientific laboratory technician Use: Never used  Substance and Sexual Activity   Alcohol use: No   Drug use: No   Sexual activity: Yes  Other Topics Concern   Not on file  Social History Narrative   Not on file   Social Determinants of Health   Financial Resource Strain:    Difficulty of Paying Living Expenses: Not on file  Food Insecurity:    Worried About Charity fundraiser in the Last Year: Not on file   [employer] of Food in the Last Year: Not on file  Transportation Needs:    Lack of Transportation (Medical): Not on file   Lack of Transportation (Non-Medical): Not on file  Physical Activity:    Days of Exercise per Week: Not on file   Minutes of Exercise per Session: Not on file  Stress:    Feeling of Stress : Not on file  Social Connections:    Frequency of Communication with Friends and Family: Not on file   Frequency of Social Gatherings with Friends and Family: Not on file   Attends Religious Services: Not on file   Active Member of Clubs or Organizations: Not on file   Attends Archivist Meetings: Not on file   Marital Status: Not on file  Intimate Partner Violence:    Fear of Current or Ex-Partner: Not on file    Emotionally Abused: Not on file   Physically Abused: Not on file   Sexually Abused: Not on file    FAMILY HISTORY: Family History  Problem Relation Age of Onset   Hypertension Father    Diabetes Mother        DIABETIC COMA    ALLERGIES:  has No Known Allergies.  MEDICATIONS:  Current Outpatient Medications  Medication Sig Dispense Refill   Accu-Chek Softclix Lancets lancets Use Accu Chek softclix lancets to check blood sugar fasting or 2 hours after a meal every other day. 100 each 2   acyclovir (  ZOVIRAX) 400 MG tablet Take 0.5 tablets (200 mg total) by mouth 2 (two) times daily. 60 tablet 11   amLODipine (NORVASC) 5 MG tablet Take 1 tablet (5 mg total) by mouth daily. 90 tablet 3   aspirin 325 MG EC tablet Take 81 mg by mouth daily.       atorvastatin (LIPITOR) 80 MG tablet Take 1 tablet (80 mg total) by mouth daily. 90 tablet 3   Blood Glucose Monitoring Suppl (ACCU-CHEK GUIDE ME) w/Device KIT 1 each by Does not apply route.     carvedilol (COREG) 25 MG tablet TAKE 1 TABLET BY MOUTH TWICE DAILY WITH MEALS 180 tablet 3   dexamethasone (DECADRON) 4 MG tablet 3 tabs (38m) orally with breakfast the day after each Daratumumab treatment. 60 tablet 2   glimepiride (AMARYL) 2 MG tablet I tab in am daily. On week of chemo take twice daily 60 tablet 3   glucose blood (ACCU-CHEK GUIDE) test strip Use Accu Chek test strips as instructed to check blood sugar fasting or two hours after meals, every other day. 100 each 2   hydrALAZINE (APRESOLINE) 50 MG tablet TAKE 1 TABLET BY MOUTH THREE TIMES DAILY 270 tablet 2   ibuprofen (ADVIL,MOTRIN) 200 MG tablet Take 200 mg by mouth every 6 (six) hours as needed for moderate pain (for knee).     isosorbide mononitrate (IMDUR) 30 MG 24 hr tablet Take 1 tablet by mouth once daily 90 tablet 1   losartan (COZAAR) 100 MG tablet Take 1 tablet by mouth once daily 90 tablet 1   ondansetron (ZOFRAN) 8 MG tablet Take 1 tablet (8 mg total) by  mouth 2 (two) times daily as needed (Nausea or vomiting). 30 tablet 1   predniSONE (DELTASONE) 20 MG tablet Take two tablets once daily for 5 days. 10 tablet 0   prochlorperazine (COMPAZINE) 10 MG tablet Take 1 tablet (10 mg total) by mouth every 6 (six) hours as needed (Nausea or vomiting). 30 tablet 1   Semaglutide (RYBELSUS) 7 MG TABS Take 1 tablet by mouth daily before breakfast. Take 30 minutes before breakfast with water (Patient not taking: Reported on 02/09/2020) 30 tablet 3   SitaGLIPtin-MetFORMIN HCl (JANUMET XR) [phone] MG TB24 1 tablet daily and dinnertime 30 tablet 1   No current facility-administered medications for this visit.    REVIEW OF SYSTEMS:  A 10+ POINT REVIEW OF SYSTEMS WAS OBTAINED including neurology, dermatology, psychiatry, cardiac, respiratory, lymph, extremities, GI, GU, Musculoskeletal, constitutional, breasts, reproductive, HEENT.  All pertinent positives are noted in the HPI.  All others are negative.   PHYSICAL EXAMINATION: ECOG PERFORMANCE STATUS: 0 - Asymptomatic  VS reviewed - stable  Exam was given in a chair   GENERAL:alert, in no acute distress and comfortable SKIN: no acute rashes, no significant lesions EYES: conjunctiva are pink and non-injected, sclera anicteric OROPHARYNX: MMM, no exudates, no oropharyngeal erythema or ulceration NECK: supple, no JVD LYMPH:  no palpable lymphadenopathy in the cervical, axillary or inguinal regions LUNGS: clear to auscultation b/l with normal respiratory effort HEART: regular rate & rhythm ABDOMEN:  normoactive bowel sounds , non tender, not distended. No palpable hepatosplenomegaly.  Extremity: no pedal edema PSYCH: alert & oriented x 3 with fluent speech NEURO: no focal motor/sensory deficits  LABORATORY DATA:  I have reviewed the data as listed  . CBC Latest Ref Rng & Units 03/05/2020 02/20/2020 02/06/2020  WBC 4.0 - 10.5 K/uL 8.0 6.3 12.0(H)  Hemoglobin 13.0 - 17.0 g/dL 8.7(L) 8.8(L) 9.5(L)  Hematocrit 39 - 52 % 27.9(L) 28.2(L) 30.0(L)  Platelets 150 - 400 K/uL 309 322 432(H)    . CMP Latest Ref Rng & Units 03/05/2020 02/20/2020 02/06/2020  Glucose 70 - 99 mg/dL 165(H) 375(H) 224(H)  BUN 8 - 23 mg/dL 19 23 26(H)  Creatinine 0.61 - 1.24 mg/dL 1.56(H) 1.62(H) 1.60(H)  Sodium 135 - 145 mmol/L 141 142 141  Potassium 3.5 - 5.1 mmol/L 3.1(L) 3.8 3.4(L)  Chloride 98 - 111 mmol/L 107 107 109  CO2 22 - 32 mmol/L _0 Calcium 8.9 - 10.3 mg/dL 9.8 9.5 10.2  Total Protein 6.5 - 8.1 g/dL 7.3 7.0 7.1  Total Bilirubin 0.3 - 1.2 mg/dL 0.4 0.4 0.3  Alkaline Phos 38 - 126 U/L 87 88 114  AST 15 - 41 U/L <6(L) <6(L) 7(L)  ALT 0 - 44 U/L <6 <6 9   10/24/2019 FISH Panel:    10/24/2019 [facility] Surgical Pathology:    RADIOGRAPHIC STUDIES: I have personally reviewed the radiological images as listed and agreed with the findings in the report. No results found.  ASSESSMENT & PLAN:    64 yo with   1)  Newly diagnosed Multiple myeloma -09/27/19 M Protein at 3.4 -10/25/2019 PET/CT ([id number]) revealed "1. No specific myelomatous lesions are identified. 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  2) Anemia ? Related to CKD vs ? Plasma cell dyscrasia 3) CKD ?etiology - HTN ? Cardiac issues ? Myeloma related.  PLAN: -Discussed pt labwork today, 03/05/20; stable anemia, Potassium is low, Calcium is steady, kidney  function is stable  -Discussed 02/20/2020 MMP which shows M Protein down to 0.5 g/dL - pt nearly at VGPR, ? 85% reduction -Discussed 48/18/5631 ANA & Cyclic citrul peptide antibody were negative, Rheumtaoid Factor was elevated -The pt has no prohibitive toxicities from continuing C5D1 Daratumumab/Dexamethasone at this time.  -Will hold additional treatments at this time to reduce toxicities. Pt continues to respond well to current treatment.  -Resend urgent referral to Dr. Estanislado Pandy for management of inflammatory arthritis. -Rx short-course Prednisone -Will see back in 4 weeks with labs   FOLLOW UP: -Referral to Dr Estanislado Pandy (Rheumatology) for management of inflammatory arthritis ASAP -Plz add MD appointment with labs and treatment on 9/21 (okay to see in infusion)   The total time spent in the appt was 25 minutes and more than 50% was on counseling and direct patient cares.  All of the patient's questions were answered with apparent satisfaction. The patient knows to call the clinic with any problems, questions or concerns.    Sullivan Lone MD [place] AAHIVMS [facility] Hematology/Oncology Physician [facility]  (Office):       [phone] (Work cell):  [phone] (Fax):           [phone]  03/05/2020 3:03 PM  I, Yevette Edwards, am acting as a scribe for Dr. Sullivan Lone.   .I have reviewed the above documentation for accuracy and completeness, and I agree with the above. [name]  Brunetta Genera MD

## 2020-03-05 NOTE — Progress Notes (Signed)
OK to treat with creatnine of 1.56 per Dr. Irene Limbo

## 2020-03-05 NOTE — Patient Instructions (Signed)
[facility] Discharge Instructions for Patients Receiving Chemotherapy  Today you received the following chemotherapy agents Daratumumab (DARZALEX).  To help prevent nausea and vomiting after your treatment, we encourage you to take your nausea medication as prescribed.   If you develop nausea and vomiting that is not controlled by your nausea medication, call the clinic.   BELOW ARE SYMPTOMS THAT SHOULD BE REPORTED IMMEDIATELY:  *FEVER GREATER THAN 100.5 F  *CHILLS WITH OR WITHOUT FEVER  NAUSEA AND VOMITING THAT IS NOT CONTROLLED WITH YOUR NAUSEA MEDICATION  *UNUSUAL SHORTNESS OF BREATH  *UNUSUAL BRUISING OR BLEEDING  TENDERNESS IN MOUTH AND THROAT WITH OR WITHOUT PRESENCE OF ULCERS  *URINARY PROBLEMS  *BOWEL PROBLEMS  UNUSUAL RASH Items with * indicate a potential emergency and should be followed up as soon as possible.  Feel free to call the clinic should you have any questions or concerns. The clinic phone number is [phone].  Please show the CHEMO ALERT CARD at check-in to the Emergency Department and triage nurse.   

## 2020-03-06 MED ORDER — PREDNISONE 20 MG PO TABS
ORAL_TABLET | ORAL | 0 refills | Status: DC
Start: 2020-03-06 — End: 2020-04-09

## 2020-03-12 ENCOUNTER — Ambulatory Visit (INDEPENDENT_AMBULATORY_CARE_PROVIDER_SITE_OTHER): Payer: Self-pay | Admitting: Endocrinology

## 2020-03-12 ENCOUNTER — Other Ambulatory Visit: Payer: Self-pay

## 2020-03-12 VITALS — BP 146/72 | HR 93 | Ht 69.5 in | Wt 183.8 lb

## 2020-03-12 DIAGNOSIS — E1165 Type 2 diabetes mellitus with hyperglycemia: Secondary | ICD-10-CM

## 2020-03-12 LAB — POCT GLUCOSE (DEVICE FOR HOME USE): POC Glucose: 59 mg/dl — AB (ref 70–99)

## 2020-03-12 MED ORDER — GLIPIZIDE ER 2.5 MG PO TB24: ORAL_TABLET | ORAL | 3 refills | Status: DC

## 2020-03-12 MED ORDER — METFORMIN HCL ER 500 MG PO TB24: 1000.0000 mg | ORAL_TABLET | Freq: Every day | ORAL | 3 refills | Status: DC

## 2020-03-12 NOTE — Patient Instructions (Addendum)
Check blood sugars on waking up 5 days a week  Also check blood sugars about 2 hours after meals and do this after different meals by rotation  Recommended blood sugar levels on waking up are 90-130 and about 2 hours after meal is 130-160  Please bring your blood sugar monitor to each visit, thank you  Stop GLIMEPIRIDE  We will start 2 new medications for the diabetes:  Metformin ER will be 1 tablet a day at dinnertime Also start GLIPIZIDE ER 2.5 mg daily  CHEMOTHERAPY: Take 2 pills of the glipizide ER and Metformin on the mornings of chemotherapy starting Tuesday and continuing through Thursday

## 2020-03-12 NOTE — Progress Notes (Signed)
Patient ID: [name], male   DOB: 1955-10-30, 64 y.o.   MRN: [id number]           Reason for Appointment: Follow-up for Type 2 Diabetes   History of Present Illness:          Date of diagnosis of type 2 diabetes mellitus:   2018      Background history:  He has never been told to have diabetes but review of his previous labs indicate that he had high blood sugar 179 in 2014 His highest sugar has been 234 in 10/2016 when his A1c was 8.8, not clear if he was symptomatic at that time He was recommended referral for diabetes at that time but he did not make an appointment  Recent history:   A1c is most recently 8, highest 8.8  Non-insulin hypoglycemic drugs the patient is taking are: Amaryl 2 mg daily  Current management, blood sugar patterns and problems identified:  He was prescribed Janumet XR and he could not afford this, may have been in the donut hole  He was told to take Amaryl 2 mg daily instead of 1 mg since his blood sugars were averaging over 200  Also he was able to take Amaryl 2 mg twice a day starting 1 to 2 days when he gets steroids for chemotherapy for the rest of the week  Overall blood sugars have improved  However with no he thinks his appetite has been fairly good his blood sugar appears to have been much lower since yesterday when it was 70 in the morning  Apparently at 3 AM he fell out of bed and could not get up and EMS found his blood sugar to be 30  Today in the office his blood sugar is 59  No significant change in renal function  His weight has gone up slightly  He is however checking blood sugars only in the mornings  None after meals  He has previously seen the dietitian for meal planning            Glucose monitoring:  Rarely, has Accu-Chek guide meter  FASTING range 70-203 with AVERAGE 159, checking about every other day on average  Previously:  PRE-MEAL Fasting Lunch Dinner Bedtime Overall  Glucose range:  157-356    230     Mean/median:      220    Dietician visit, most recent: 2/21  Weight history:  Wt Readings from Last 3 Encounters:  03/12/20 183 lb 12.8 oz (83.4 kg)  03/05/20 178 lb 4 oz (80.9 kg)  02/20/20 177 lb (80.3 kg)    Glycemic control:   Lab Results  Component Value Date   HGBA1C 8.0 (H) 12/20/2019   HGBA1C 7.8 (H) 09/04/2019   HGBA1C 7.9 (H) 05/23/2019   Lab Results  Component Value Date   MICROALBUR 12.6 (H) 07/21/2018   LDLCALC 39 09/04/2019   CREATININE 1.56 (H) 03/05/2020   Lab Results  Component Value Date   MICRALBCREAT 10.0 07/21/2018    Lab Results  Component Value Date   FRUCTOSAMINE 322 (H) 02/02/2020   FRUCTOSAMINE 311 (H) 11/20/2019   FRUCTOSAMINE 322 (H) 03/10/2019    No visits with results within 1 Week(s) from this visit.  Latest known visit with results is:  Appointment on 03/05/2020  Component Date Value Ref Range Status  . WBC 03/05/2020 8.0  4.0 - 10.5 K/uL Final  . RBC 03/05/2020 3.53* 4.22 - 5.81 MIL/uL Final  . Hemoglobin 03/05/2020 8.7* 13.0 -  17.0 g/dL Final   Comment: Reticulocyte Hemoglobin testing may be clinically indicated, consider ordering this additional test [id number]   . HCT 03/05/2020 27.9* 39 - 52 % Final  . MCV 03/05/2020 79.0* 80.0 - 100.0 fL Final  . MCH 03/05/2020 24.6* 26.0 - 34.0 pg Final  . MCHC 03/05/2020 31.2  30.0 - 36.0 g/dL Final  . RDW 03/05/2020 17.4* 11.5 - 15.5 % Final  . Platelets 03/05/2020 309  150 - 400 K/uL Final  . nRBC 03/05/2020 0.0  0.0 - 0.2 % Final  . Neutrophils Relative % 03/05/2020 74  % Final  . Neutro Abs 03/05/2020 6.0  1.7 - 7.7 K/uL Final  . Lymphocytes Relative 03/05/2020 15  % Final  . Lymphs Abs 03/05/2020 1.2  0.7 - 4.0 K/uL Final  . Monocytes Relative 03/05/2020 10  % Final  . Monocytes Absolute 03/05/2020 0.8  0 - 1 K/uL Final  . Eosinophils Relative 03/05/2020 0  % Final  . Eosinophils Absolute 03/05/2020 0.0  0 - 0 K/uL Final  . Basophils Relative 03/05/2020 0  % Final  .  Basophils Absolute 03/05/2020 0.0  0 - 0 K/uL Final  . Immature Granulocytes 03/05/2020 1  % Final  . Abs Immature Granulocytes 03/05/2020 0.04  0.00 - 0.07 K/uL Final   Performed at [facility], [place] [address], [place], [place] [address]  . Sodium 03/05/2020 141  135 - 145 mmol/L Final  . Potassium 03/05/2020 3.1* 3.5 - 5.1 mmol/L Final  . Chloride 03/05/2020 107  98 - 111 mmol/L Final  . CO2 03/05/2020 24  22 - 32 mmol/L Final  . Glucose, Bld 03/05/2020 165* 70 - 99 mg/dL Final   Glucose reference range applies only to samples taken after fasting for at least 8 hours.  . BUN 03/05/2020 19  8 - 23 mg/dL Final  . Creatinine 03/05/2020 1.56* 0.61 - 1.24 mg/dL Final  . Calcium 03/05/2020 9.8  8.9 - 10.3 mg/dL Final  . Total Protein 03/05/2020 7.3  6.5 - 8.1 g/dL Final  . Albumin 03/05/2020 2.9* 3.5 - 5.0 g/dL Final  . AST 03/05/2020 <6* 15 - 41 U/L Final  . ALT 03/05/2020 <6  0 - 44 U/L Final  . Alkaline Phosphatase 03/05/2020 87  38 - 126 U/L Final  . Total Bilirubin 03/05/2020 0.4  0.3 - 1.2 mg/dL Final  . GFR, Est Non Af Am 03/05/2020 46* >60 mL/min Final  . GFR, Est AFR Am 03/05/2020 54* >60 mL/min Final  . Anion gap 03/05/2020 10  5 - 15 Final   Performed at [facility], [place] [address], [place], [place] [address]    Allergies as of 03/12/2020   No Known Allergies     Medication List       Accurate as of March 12, 2020  3:03 PM. If you have any questions, ask your nurse or doctor.        STOP taking these medications   glimepiride 2 MG tablet Commonly known as: AMARYL Stopped by: Elayne Snare, MD   Janumet XR [phone] MG Tb24 Generic drug: SitaGLIPtin-MetFORMIN HCl Stopped by: Elayne Snare, MD   Rybelsus 7 MG Tabs Generic drug: Semaglutide Stopped by: Elayne Snare, MD     TAKE these medications   Accu-Chek Guide Me w/Device Kit 1 each by Does not apply route.   Accu-Chek Guide test strip Generic drug: glucose  blood Use Accu Chek test strips as instructed to check blood sugar  fasting or two hours after meals, every other day.   Accu-Chek Softclix Lancets lancets Use Accu Chek softclix lancets to check blood sugar fasting or 2 hours after a meal every other day.   acyclovir 400 MG tablet Commonly known as: ZOVIRAX Take 0.5 tablets (200 mg total) by mouth 2 (two) times daily.   amLODipine 5 MG tablet Commonly known as: NORVASC Take 1 tablet (5 mg total) by mouth daily.   aspirin 325 MG EC tablet Take 81 mg by mouth daily.   atorvastatin 80 MG tablet Commonly known as: LIPITOR Take 1 tablet (80 mg total) by mouth daily.   carvedilol 25 MG tablet Commonly known as: COREG TAKE 1 TABLET BY MOUTH TWICE DAILY WITH MEALS   dexamethasone 4 MG tablet Commonly known as: DECADRON 3 tabs ($Remov'12mg'qgYIbY$ ) orally with breakfast the day after each Daratumumab treatment.   glipiZIDE 2.5 MG 24 hr tablet Commonly known as: GLUCOTROL XL Take 1 tablet daily except take 2 tablets daily from Tuesday through Thursday when getting chemotherapy Started by: Elayne Snare, MD   hydrALAZINE 50 MG tablet Commonly known as: APRESOLINE TAKE 1 TABLET BY MOUTH THREE TIMES DAILY   ibuprofen 200 MG tablet Commonly known as: ADVIL Take 200 mg by mouth every 6 (six) hours as needed for moderate pain (for knee).   isosorbide mononitrate 30 MG 24 hr tablet Commonly known as: IMDUR Take 1 tablet by mouth once daily   losartan 100 MG tablet Commonly known as: COZAAR Take 1 tablet by mouth once daily   metFORMIN 500 MG 24 hr tablet Commonly known as: GLUCOPHAGE-XR Take 2 tablets (1,000 mg total) by mouth daily with supper. Started by: Elayne Snare, MD   ondansetron 8 MG tablet Commonly known as: Zofran Take 1 tablet (8 mg total) by mouth 2 (two) times daily as needed (Nausea or vomiting).   predniSONE 20 MG tablet Commonly known as: DELTASONE Take 1 tablet once daily for 5 days.   prochlorperazine 10 MG  tablet Commonly known as: COMPAZINE Take 1 tablet (10 mg total) by mouth every 6 (six) hours as needed (Nausea or vomiting).       Allergies: No Known Allergies  Past Medical History:  Diagnosis Date  . Arthritis    "left knee" (11/24/2017)  . CAD (coronary artery disease)    CABG 2002  . CHF (congestive heart failure) ([place])   . Chronic renal insufficiency   . Diabetes mellitus without complication ([place])   . Dilated cardiomyopathy ([place])    echo in 2009 showed improvement with a normal EF  . HTN (hypertension)   . Hyperlipemia   . Noncompliance   . PAD (peripheral artery disease) ([place])     Past Surgical History:  Procedure Laterality Date  . CARDIAC CATHETERIZATION  2002, 2006  . CORONARY ARTERY BYPASS GRAFT  2002   x4 DR. VAN TRIGT  . IR FLUORO GUIDED NEEDLE PLC ASPIRATION/INJECTION LOC  10/24/2019    Family History  Problem Relation Age of Onset  . Hypertension Father   . Diabetes Mother        DIABETIC COMA    Social History:  reports that he has never smoked. He has never used smokeless tobacco. He reports that he does not drink alcohol and does not use drugs.   Review of Systems   Lipid history: On atorvastatin 80 mg since diagnosis of CAD, labs as follows    Lab Results  Component Value Date   CHOL 92 (L) 09/04/2019  HDL 35 (L) 09/04/2019   LDLCALC 39 09/04/2019   LDLDIRECT 149.4 01/07/2011   TRIG 89 09/04/2019   CHOLHDL 2.6 09/04/2019           Hypertension: Has been followed by cardiologist for blood pressure control Recent readings:  BP Readings from Last 3 Encounters:  03/12/20 (!) 146/72  03/05/20 (!) 148/70  02/20/20 (!) 143/82   RENAL dysfunction is about the same, still moderately impaired No history of proteinuria  Lab Results  Component Value Date   CREATININE 1.56 (H) 03/05/2020   CREATININE 1.62 (H) 02/20/2020   CREATININE 1.60 (H) 02/06/2020    Most recent eye exam was in 2018  Most recent foot exam: 07/2018  Currently  known complications of diabetes:none  LABS:  No visits with results within 1 Week(s) from this visit.  Latest known visit with results is:  Appointment on 03/05/2020  Component Date Value Ref Range Status  . WBC 03/05/2020 8.0  4.0 - 10.5 K/uL Final  . RBC 03/05/2020 3.53* 4.22 - 5.81 MIL/uL Final  . Hemoglobin 03/05/2020 8.7* 13.0 - 17.0 g/dL Final   Comment: Reticulocyte Hemoglobin testing may be clinically indicated, consider ordering this additional test [id number]   . HCT 03/05/2020 27.9* 39 - 52 % Final  . MCV 03/05/2020 79.0* 80.0 - 100.0 fL Final  . MCH 03/05/2020 24.6* 26.0 - 34.0 pg Final  . MCHC 03/05/2020 31.2  30.0 - 36.0 g/dL Final  . RDW 00/06/3934 17.4* 11.5 - 15.5 % Final  . Platelets 03/05/2020 309  150 - 400 K/uL Final  . nRBC 03/05/2020 0.0  0.0 - 0.2 % Final  . Neutrophils Relative % 03/05/2020 74  % Final  . Neutro Abs 03/05/2020 6.0  1.7 - 7.7 K/uL Final  . Lymphocytes Relative 03/05/2020 15  % Final  . Lymphs Abs 03/05/2020 1.2  0.7 - 4.0 K/uL Final  . Monocytes Relative 03/05/2020 10  % Final  . Monocytes Absolute 03/05/2020 0.8  0 - 1 K/uL Final  . Eosinophils Relative 03/05/2020 0  % Final  . Eosinophils Absolute 03/05/2020 0.0  0 - 0 K/uL Final  . Basophils Relative 03/05/2020 0  % Final  . Basophils Absolute 03/05/2020 0.0  0 - 0 K/uL Final  . Immature Granulocytes 03/05/2020 1  % Final  . Abs Immature Granulocytes 03/05/2020 0.04  0.00 - 0.07 K/uL Final   Performed at [facility], [address] [place], [place] [address]  . Sodium 03/05/2020 141  135 - 145 mmol/L Final  . Potassium 03/05/2020 3.1* 3.5 - 5.1 mmol/L Final  . Chloride 03/05/2020 107  98 - 111 mmol/L Final  . CO2 03/05/2020 24  22 - 32 mmol/L Final  . Glucose, Bld 03/05/2020 165* 70 - 99 mg/dL Final   Glucose reference range applies only to samples taken after fasting for at least 8 hours.  . BUN 03/05/2020 19  8 - 23 mg/dL Final  . Creatinine 50/25/6154  1.56* 0.61 - 1.24 mg/dL Final  . Calcium 88/45/7334 9.8  8.9 - 10.3 mg/dL Final  . Total Protein 03/05/2020 7.3  6.5 - 8.1 g/dL Final  . Albumin 48/30/1599 2.9* 3.5 - 5.0 g/dL Final  . AST 68/95/7022 <6* 15 - 41 U/L Final  . ALT 03/05/2020 <6  0 - 44 U/L Final  . Alkaline Phosphatase 03/05/2020 87  38 - 126 U/L Final  . Total Bilirubin 03/05/2020 0.4  0.3 - 1.2 mg/dL Final  . GFR, Est Non  Af Am 03/05/2020 46* >60 mL/min Final  . GFR, Est AFR Am 03/05/2020 54* >60 mL/min Final  . Anion gap 03/05/2020 10  5 - 15 Final   Performed at [facility], [place] [address], [place], [place] [address]    Physical Examination:  BP (!) 146/72 (BP Location: Left Arm, Patient Position: Sitting, Cuff Size: Normal)   Pulse 93   Ht 5' 9.5" (1.765 m)   Wt 183 lb 12.8 oz (83.4 kg)   SpO2 95%   BMI 26.75 kg/m          ASSESSMENT:  Diabetes type 2, mild  See history of present illness for detailed discussion of current diabetes management, blood sugar patterns and problems identified  A1c last 8.0 in June  His blood sugars are averaging around 150 fasting recently with Amaryl, previously were significantly higher Also blood sugars go up significantly with his prednisone for chemotherapy  Not clear why his blood sugars have been much lower since yesterday with severe hypoglycemia last night even though he only had taken Amaryl once a day since Sunday This may be partly related to his continued renal dysfunction   PLAN:    1. Glucose monitoring: He will start checking blood sugars more often alternating morning and evening/after meals When he has chemotherapy he will check his blood sugars at least 3 times a day Discussed what his blood sugars need to be morning and after meals   2.    Medication recommendations He will be changed to GLIPIZIDE ER 2.5 mg daily starting on Thursday No Amaryl to be taken tomorrow Also since his renal function shows GFR 54 he will start  Metformin ER 500 mg daily He will double the doses of both medications for 3 days until days when he starts prednisone  He will call if he has any difficulty with his blood sugar control Avoid skipping meals  Patient Instructions  Check blood sugars on waking up 5 days a week  Also check blood sugars about 2 hours after meals and do this after different meals by rotation  Recommended blood sugar levels on waking up are 90-130 and about 2 hours after meal is 130-160  Please bring your blood sugar monitor to each visit, thank you  Stop GLIMEPIRIDE  We will start 2 new medications for the diabetes:  Metformin ER will be 1 tablet a day at dinnertime Also start GLIPIZIDE ER 2.5 mg daily  CHEMOTHERAPY: Take 2 pills of the glipizide ER and Metformin on the mornings of chemotherapy starting Tuesday and continuing through Thursday      [name] 03/12/2020, 3:03 PM   Note: This office note was prepared with Dragon voice recognition system technology. Any transcriptional errors that result from this process are unintentional.   Elayne Snare

## 2020-03-13 ENCOUNTER — Emergency Department (HOSPITAL_COMMUNITY): Payer: Managed Care, Other (non HMO)

## 2020-03-13 ENCOUNTER — Inpatient Hospital Stay (HOSPITAL_COMMUNITY)
Admission: EM | Admit: 2020-03-13 | Discharge: 2020-03-18 | DRG: 871 | Disposition: A | Discharge status: Home / Self Care | Payer: No Typology Code available for payment source | Attending: Internal Medicine | Admitting: Internal Medicine

## 2020-03-13 ENCOUNTER — Encounter (HOSPITAL_COMMUNITY): Payer: Self-pay | Admitting: Student

## 2020-03-13 ENCOUNTER — Inpatient Hospital Stay (HOSPITAL_COMMUNITY): Payer: Managed Care, Other (non HMO)

## 2020-03-13 DIAGNOSIS — R339 Retention of urine, unspecified: Secondary | ICD-10-CM | POA: Diagnosis present

## 2020-03-13 DIAGNOSIS — I2510 Atherosclerotic heart disease of native coronary artery without angina pectoris: Secondary | ICD-10-CM | POA: Diagnosis present

## 2020-03-13 DIAGNOSIS — A419 Sepsis, unspecified organism: Secondary | ICD-10-CM | POA: Diagnosis present

## 2020-03-13 DIAGNOSIS — E1151 Type 2 diabetes mellitus with diabetic peripheral angiopathy without gangrene: Secondary | ICD-10-CM | POA: Diagnosis present

## 2020-03-13 DIAGNOSIS — N1831 Chronic kidney disease, stage 3a: Secondary | ICD-10-CM | POA: Diagnosis present

## 2020-03-13 DIAGNOSIS — E872 Acidosis: Secondary | ICD-10-CM | POA: Diagnosis present

## 2020-03-13 DIAGNOSIS — R6520 Severe sepsis without septic shock: Secondary | ICD-10-CM | POA: Diagnosis present

## 2020-03-13 DIAGNOSIS — J9601 Acute respiratory failure with hypoxia: Secondary | ICD-10-CM

## 2020-03-13 DIAGNOSIS — R569 Unspecified convulsions: Secondary | ICD-10-CM

## 2020-03-13 DIAGNOSIS — J189 Pneumonia, unspecified organism: Secondary | ICD-10-CM

## 2020-03-13 DIAGNOSIS — E1122 Type 2 diabetes mellitus with diabetic chronic kidney disease: Secondary | ICD-10-CM | POA: Diagnosis present

## 2020-03-13 DIAGNOSIS — E1165 Type 2 diabetes mellitus with hyperglycemia: Secondary | ICD-10-CM | POA: Diagnosis not present

## 2020-03-13 DIAGNOSIS — I10 Essential (primary) hypertension: Secondary | ICD-10-CM | POA: Diagnosis present

## 2020-03-13 DIAGNOSIS — Z7984 Long term (current) use of oral hypoglycemic drugs: Secondary | ICD-10-CM

## 2020-03-13 DIAGNOSIS — Z8249 Family history of ischemic heart disease and other diseases of the circulatory system: Secondary | ICD-10-CM

## 2020-03-13 DIAGNOSIS — Z86711 Personal history of pulmonary embolism: Secondary | ICD-10-CM

## 2020-03-13 DIAGNOSIS — I129 Hypertensive chronic kidney disease with stage 1 through stage 4 chronic kidney disease, or unspecified chronic kidney disease: Secondary | ICD-10-CM | POA: Diagnosis present

## 2020-03-13 DIAGNOSIS — R4189 Other symptoms and signs involving cognitive functions and awareness: Secondary | ICD-10-CM | POA: Diagnosis not present

## 2020-03-13 DIAGNOSIS — I2693 Single subsegmental pulmonary embolism without acute cor pulmonale: Secondary | ICD-10-CM | POA: Diagnosis present

## 2020-03-13 DIAGNOSIS — E785 Hyperlipidemia, unspecified: Secondary | ICD-10-CM | POA: Diagnosis present

## 2020-03-13 DIAGNOSIS — Z951 Presence of aortocoronary bypass graft: Secondary | ICD-10-CM

## 2020-03-13 DIAGNOSIS — Z20822 Contact with and (suspected) exposure to covid-19: Secondary | ICD-10-CM | POA: Diagnosis present

## 2020-03-13 DIAGNOSIS — Z9119 Patient's noncompliance with other medical treatment and regimen: Secondary | ICD-10-CM

## 2020-03-13 DIAGNOSIS — D630 Anemia in neoplastic disease: Secondary | ICD-10-CM | POA: Diagnosis present

## 2020-03-13 DIAGNOSIS — N390 Urinary tract infection, site not specified: Secondary | ICD-10-CM | POA: Diagnosis present

## 2020-03-13 DIAGNOSIS — I420 Dilated cardiomyopathy: Secondary | ICD-10-CM | POA: Diagnosis present

## 2020-03-13 DIAGNOSIS — E162 Hypoglycemia, unspecified: Secondary | ICD-10-CM

## 2020-03-13 DIAGNOSIS — C9000 Multiple myeloma not having achieved remission: Secondary | ICD-10-CM | POA: Diagnosis present

## 2020-03-13 DIAGNOSIS — E861 Hypovolemia: Secondary | ICD-10-CM | POA: Diagnosis present

## 2020-03-13 DIAGNOSIS — J690 Pneumonitis due to inhalation of food and vomit: Secondary | ICD-10-CM | POA: Diagnosis present

## 2020-03-13 DIAGNOSIS — D649 Anemia, unspecified: Secondary | ICD-10-CM | POA: Diagnosis present

## 2020-03-13 DIAGNOSIS — Z833 Family history of diabetes mellitus: Secondary | ICD-10-CM

## 2020-03-13 DIAGNOSIS — E11649 Type 2 diabetes mellitus with hypoglycemia without coma: Secondary | ICD-10-CM | POA: Diagnosis present

## 2020-03-13 DIAGNOSIS — A4101 Sepsis due to Methicillin susceptible Staphylococcus aureus: Principal | ICD-10-CM | POA: Diagnosis present

## 2020-03-13 DIAGNOSIS — Z7982 Long term (current) use of aspirin: Secondary | ICD-10-CM

## 2020-03-13 DIAGNOSIS — G9341 Metabolic encephalopathy: Secondary | ICD-10-CM | POA: Diagnosis present

## 2020-03-13 DIAGNOSIS — Z8679 Personal history of other diseases of the circulatory system: Secondary | ICD-10-CM

## 2020-03-13 DIAGNOSIS — N179 Acute kidney failure, unspecified: Secondary | ICD-10-CM | POA: Diagnosis present

## 2020-03-13 DIAGNOSIS — I2699 Other pulmonary embolism without acute cor pulmonale: Secondary | ICD-10-CM | POA: Diagnosis present

## 2020-03-13 DIAGNOSIS — Z79899 Other long term (current) drug therapy: Secondary | ICD-10-CM

## 2020-03-13 DIAGNOSIS — N1330 Unspecified hydronephrosis: Secondary | ICD-10-CM | POA: Diagnosis present

## 2020-03-13 HISTORY — DX: Essential (primary) hypertension: I10

## 2020-03-13 HISTORY — DX: Pneumonia, unspecified organism: J18.9

## 2020-03-13 HISTORY — DX: Anemia, unspecified: D64.9

## 2020-03-13 HISTORY — DX: Malignant (primary) neoplasm, unspecified: C80.1

## 2020-03-13 HISTORY — DX: Chronic kidney disease, unspecified: N18.9

## 2020-03-13 HISTORY — DX: Personal history of pulmonary embolism: Z86.711

## 2020-03-13 LAB — I-STAT CHEM 8, ED
BUN: 23 mg/dL (ref 8–23)
Calcium, Ion: 1.02 mmol/L — ABNORMAL LOW (ref 1.15–1.40)
Chloride: 106 mmol/L (ref 98–111)
Creatinine, Ser: 1.6 mg/dL — ABNORMAL HIGH (ref 0.61–1.24)
Glucose, Bld: 50 mg/dL — ABNORMAL LOW (ref 70–99)
HCT: 33 % — ABNORMAL LOW (ref 39.0–52.0)
Hemoglobin: 11.2 g/dL — ABNORMAL LOW (ref 13.0–17.0)
Potassium: 4.5 mmol/L (ref 3.5–5.1)
Sodium: 141 mmol/L (ref 135–145)
TCO2: 24 mmol/L (ref 22–32)

## 2020-03-13 LAB — DIFFERENTIAL
Abs Immature Granulocytes: 0.14 10*3/uL — ABNORMAL HIGH (ref 0.00–0.07)
Basophils Absolute: 0 10*3/uL (ref 0.0–0.1)
Basophils Relative: 0 %
Eosinophils Absolute: 0 10*3/uL (ref 0.0–0.5)
Eosinophils Relative: 0 %
Immature Granulocytes: 1 %
Lymphocytes Relative: 10 %
Lymphs Abs: 1.5 10*3/uL (ref 0.7–4.0)
Monocytes Absolute: 1.7 10*3/uL — ABNORMAL HIGH (ref 0.1–1.0)
Monocytes Relative: 11 %
Neutro Abs: 12 10*3/uL — ABNORMAL HIGH (ref 1.7–7.7)
Neutrophils Relative %: 78 %

## 2020-03-13 LAB — BASIC METABOLIC PANEL
Anion gap: 11 (ref 5–15)
BUN: 18 mg/dL (ref 8–23)
CO2: 21 mmol/L — ABNORMAL LOW (ref 22–32)
Calcium: 7.4 mg/dL — ABNORMAL LOW (ref 8.9–10.3)
Chloride: 109 mmol/L (ref 98–111)
Creatinine, Ser: 1.76 mg/dL — ABNORMAL HIGH (ref 0.61–1.24)
GFR calc Af Amer: 46 mL/min — ABNORMAL LOW (ref >60)
GFR calc non Af Amer: 40 mL/min — ABNORMAL LOW (ref >60)
Glucose, Bld: 97 mg/dL (ref 70–99)
Potassium: 3.1 mmol/L — ABNORMAL LOW (ref 3.5–5.1)
Sodium: 141 mmol/L (ref 135–145)

## 2020-03-13 LAB — COMPREHENSIVE METABOLIC PANEL
ALT: 11 U/L (ref 0–44)
AST: 18 U/L (ref 15–41)
Albumin: 2.7 g/dL — ABNORMAL LOW (ref 3.5–5.0)
Alkaline Phosphatase: 61 U/L (ref 38–126)
Anion gap: 17 — ABNORMAL HIGH (ref 5–15)
BUN: 18 mg/dL (ref 8–23)
CO2: 20 mmol/L — ABNORMAL LOW (ref 22–32)
Calcium: 8.7 mg/dL — ABNORMAL LOW (ref 8.9–10.3)
Chloride: 103 mmol/L (ref 98–111)
Creatinine, Ser: 1.79 mg/dL — ABNORMAL HIGH (ref 0.61–1.24)
GFR calc Af Amer: 45 mL/min — ABNORMAL LOW (ref >60)
GFR calc non Af Amer: 39 mL/min — ABNORMAL LOW (ref >60)
Glucose, Bld: 50 mg/dL — ABNORMAL LOW (ref 70–99)
Potassium: 4.7 mmol/L (ref 3.5–5.1)
Sodium: 140 mmol/L (ref 135–145)
Total Bilirubin: 0.6 mg/dL (ref 0.3–1.2)
Total Protein: 7 g/dL (ref 6.5–8.1)

## 2020-03-13 LAB — LACTIC ACID, PLASMA
Lactic Acid, Venous: 5.9 mmol/L (ref 0.5–1.9)
Lactic Acid, Venous: 1.7 mmol/L (ref 0.5–1.9)
Lactic Acid, Venous: 2.3 mmol/L (ref 0.5–1.9)

## 2020-03-13 LAB — URINALYSIS, ROUTINE W REFLEX MICROSCOPIC
Bilirubin Urine: NEGATIVE
Bilirubin Urine: NEGATIVE
Glucose, UA: NEGATIVE mg/dL
Glucose, UA: NEGATIVE mg/dL
Ketones, ur: NEGATIVE mg/dL
Ketones, ur: NEGATIVE mg/dL
Nitrite: POSITIVE — AB
Nitrite: NEGATIVE
Protein, ur: 30 mg/dL — AB
Protein, ur: 100 mg/dL — AB
Specific Gravity, Urine: 1.014 (ref 1.005–1.030)
Specific Gravity, Urine: 1.024 (ref 1.005–1.030)
WBC, UA: >50 WBC/hpf — ABNORMAL HIGH (ref 0–5)
pH: 7 (ref 5.0–8.0)
pH: 7 (ref 5.0–8.0)

## 2020-03-13 LAB — I-STAT ARTERIAL BLOOD GAS, ED
Acid-Base Excess: 6 mmol/L — ABNORMAL HIGH (ref 0.0–2.0)
Bicarbonate: 27.7 mmol/L (ref 20.0–28.0)
Calcium, Ion: 1.09 mmol/L — ABNORMAL LOW (ref 1.15–1.40)
HCT: 30 % — ABNORMAL LOW (ref 39.0–52.0)
Hemoglobin: 10.2 g/dL — ABNORMAL LOW (ref 13.0–17.0)
O2 Saturation: 100 %
Potassium: 3.2 mmol/L — ABNORMAL LOW (ref 3.5–5.1)
Sodium: 140 mmol/L (ref 135–145)
TCO2: 29 mmol/L (ref 22–32)
pCO2 arterial: 28.9 mmHg — ABNORMAL LOW (ref 32.0–48.0)
pH, Arterial: 7.59 — ABNORMAL HIGH (ref 7.350–7.450)
pO2, Arterial: 332 mmHg — ABNORMAL HIGH (ref 83.0–108.0)

## 2020-03-13 LAB — CBC
HCT: 35.1 % — ABNORMAL LOW (ref 39.0–52.0)
Hemoglobin: 10.3 g/dL — ABNORMAL LOW (ref 13.0–17.0)
MCH: 24.8 pg — ABNORMAL LOW (ref 26.0–34.0)
MCHC: 29.3 g/dL — ABNORMAL LOW (ref 30.0–36.0)
MCV: 84.4 fL (ref 80.0–100.0)
Platelets: 415 10*3/uL — ABNORMAL HIGH (ref 150–400)
RBC: 4.16 MIL/uL — ABNORMAL LOW (ref 4.22–5.81)
RDW: 19 % — ABNORMAL HIGH (ref 11.5–15.5)
WBC: 15.3 10*3/uL — ABNORMAL HIGH (ref 4.0–10.5)
nRBC: 0 % (ref 0.0–0.2)

## 2020-03-13 LAB — HIV ANTIBODY (ROUTINE TESTING W REFLEX): HIV Screen 4th Generation wRfx: NONREACTIVE

## 2020-03-13 LAB — RAPID URINE DRUG SCREEN, HOSP PERFORMED
Amphetamines: NOT DETECTED
Barbiturates: NOT DETECTED
Benzodiazepines: NOT DETECTED
Cocaine: NOT DETECTED
Opiates: NOT DETECTED
Tetrahydrocannabinol: NOT DETECTED

## 2020-03-13 LAB — CBG MONITORING, ED
Glucose-Capillary: 107 mg/dL — ABNORMAL HIGH (ref 70–99)
Glucose-Capillary: 19 mg/dL — CL (ref 70–99)
Glucose-Capillary: 87 mg/dL (ref 70–99)
Glucose-Capillary: 11 mg/dL — CL (ref 70–99)
Glucose-Capillary: 69 mg/dL — ABNORMAL LOW (ref 70–99)
Glucose-Capillary: 92 mg/dL (ref 70–99)
Glucose-Capillary: 118 mg/dL — ABNORMAL HIGH (ref 70–99)
Glucose-Capillary: 40 mg/dL — CL (ref 70–99)
Glucose-Capillary: 53 mg/dL — ABNORMAL LOW (ref 70–99)
Glucose-Capillary: 41 mg/dL — CL (ref 70–99)
Glucose-Capillary: 83 mg/dL (ref 70–99)
Glucose-Capillary: 63 mg/dL — ABNORMAL LOW (ref 70–99)

## 2020-03-13 LAB — APTT: aPTT: 29 seconds (ref 24–36)

## 2020-03-13 LAB — HEPARIN LEVEL (UNFRACTIONATED)
Heparin Unfractionated: 0.38 IU/mL (ref 0.30–0.70)
Heparin Unfractionated: 0.4 IU/mL (ref 0.30–0.70)

## 2020-03-13 LAB — ETHANOL: Alcohol, Ethyl (B): <10 mg/dL (ref <10)

## 2020-03-13 LAB — AMMONIA: Ammonia: 23 umol/L (ref 9–35)

## 2020-03-13 LAB — PROTIME-INR
INR: 1.1 (ref 0.8–1.2)
Prothrombin Time: 13.6 seconds (ref 11.4–15.2)

## 2020-03-13 LAB — TROPONIN I (HIGH SENSITIVITY)
Troponin I (High Sensitivity): 20 ng/L — ABNORMAL HIGH (ref <18)
Troponin I (High Sensitivity): 42 ng/L — ABNORMAL HIGH (ref <18)

## 2020-03-13 LAB — HEMOGLOBIN AND HEMATOCRIT, BLOOD
HCT: 23.3 % — ABNORMAL LOW (ref 39.0–52.0)
Hemoglobin: 7.2 g/dL — ABNORMAL LOW (ref 13.0–17.0)

## 2020-03-13 LAB — BRAIN NATRIURETIC PEPTIDE: B Natriuretic Peptide: 419 pg/mL — ABNORMAL HIGH (ref 0.0–100.0)

## 2020-03-13 LAB — SARS CORONAVIRUS 2 BY RT PCR (HOSPITAL ORDER, PERFORMED IN ~~LOC~~ HOSPITAL LAB): SARS Coronavirus 2: NEGATIVE

## 2020-03-13 LAB — TSH: TSH: 0.355 u[IU]/mL (ref 0.350–4.500)

## 2020-03-13 MED ORDER — ETOMIDATE 2 MG/ML IV SOLN
20.0000 mg | Freq: Once | INTRAVENOUS | Status: AC
  Administered 2020-03-13: 20 mg via INTRAVENOUS

## 2020-03-13 MED ORDER — POTASSIUM CHLORIDE 20 MEQ PO PACK: 40.0000 meq | PACK | Freq: Two times a day (BID) | ORAL | Status: DC

## 2020-03-13 MED ORDER — POLYETHYLENE GLYCOL 3350 17 G PO PACK: 17.0000 g | PACK | Freq: Every day | ORAL | Status: DC | PRN

## 2020-03-13 MED ORDER — FAMOTIDINE 20 MG PO TABS
20.0000 mg | ORAL_TABLET | Freq: Two times a day (BID) | ORAL | Status: DC
  Administered 2020-03-13: 20 mg via ORAL
  Filled 2020-03-13 (×2): qty 1

## 2020-03-13 MED ORDER — LACTATED RINGERS IV BOLUS
500.0000 mL | Freq: Once | INTRAVENOUS | Status: AC
  Administered 2020-03-13: 500 mL via INTRAVENOUS

## 2020-03-13 MED ORDER — DEXTROSE 10 % IV SOLN
100.0000 mL | Freq: Once | INTRAVENOUS | Status: AC
  Administered 2020-03-13: 100 mL via INTRAVENOUS

## 2020-03-13 MED ORDER — ATORVASTATIN CALCIUM 80 MG PO TABS
80.0000 mg | ORAL_TABLET | Freq: Every day | ORAL | Status: DC
  Administered 2020-03-13: 80 mg via ORAL
  Filled 2020-03-13 (×2): qty 1

## 2020-03-13 MED ORDER — VITAL HIGH PROTEIN PO LIQD
1000.0000 mL | ORAL | Status: DC
  Administered 2020-03-13: 1000 mL
  Filled 2020-03-13: qty 1000

## 2020-03-13 MED ORDER — SODIUM CHLORIDE 0.9 % IV BOLUS
2000.0000 mL | Freq: Once | INTRAVENOUS | Status: AC
  Administered 2020-03-13: 2000 mL via INTRAVENOUS

## 2020-03-13 MED ORDER — SODIUM CHLORIDE 0.9 % IV BOLUS
1000.0000 mL | Freq: Once | INTRAVENOUS | Status: AC
  Administered 2020-03-13: 1000 mL via INTRAVENOUS

## 2020-03-13 MED ORDER — CHLORHEXIDINE GLUCONATE 0.12 % MT SOLN
15.0000 mL | Freq: Two times a day (BID) | OROMUCOSAL | Status: DC
  Filled 2020-03-13 (×2): qty 15

## 2020-03-13 MED ORDER — HEPARIN (PORCINE) 25000 UT/250ML-% IV SOLN
1450.0000 [IU]/h | INTRAVENOUS | Status: DC
  Administered 2020-03-13 – 2020-03-15 (×2): 1450 [IU]/h via INTRAVENOUS
  Filled 2020-03-13 (×4): qty 250

## 2020-03-13 MED ORDER — DEXTROSE 50 % IV SOLN
INTRAVENOUS | Status: AC
  Administered 2020-03-13: 1
  Filled 2020-03-13: qty 50

## 2020-03-13 MED ORDER — PROPOFOL 1000 MG/100ML IV EMUL
5.0000 ug/kg/min | INTRAVENOUS | Status: DC
  Administered 2020-03-13: 20 ug/kg/min via INTRAVENOUS
  Administered 2020-03-13: 5 ug/kg/min via INTRAVENOUS
  Administered 2020-03-14: 25 ug/kg/min via INTRAVENOUS
  Filled 2020-03-13 (×4): qty 100

## 2020-03-13 MED ORDER — VANCOMYCIN HCL IN DEXTROSE 1-5 GM/200ML-% IV SOLN
1000.0000 mg | Freq: Once | INTRAVENOUS | Status: AC
  Administered 2020-03-13: 1000 mg via INTRAVENOUS
  Filled 2020-03-13: qty 200

## 2020-03-13 MED ORDER — POTASSIUM CHLORIDE 20 MEQ/15ML (10%) PO SOLN
40.0000 meq | Freq: Once | ORAL | Status: AC
  Administered 2020-03-14: 40 meq via ORAL
  Filled 2020-03-13: qty 30

## 2020-03-13 MED ORDER — ACETAMINOPHEN 325 MG PO TABS
650.0000 mg | ORAL_TABLET | Freq: Four times a day (QID) | ORAL | Status: DC | PRN
  Administered 2020-03-13: 650 mg
  Filled 2020-03-13: qty 2

## 2020-03-13 MED ORDER — SODIUM CHLORIDE 0.9% FLUSH
3.0000 mL | Freq: Two times a day (BID) | INTRAVENOUS | Status: DC
  Administered 2020-03-14 – 2020-03-16 (×5): 3 mL via INTRAVENOUS

## 2020-03-13 MED ORDER — HEPARIN BOLUS VIA INFUSION
4500.0000 [IU] | Freq: Once | INTRAVENOUS | Status: AC
  Administered 2020-03-13: 4500 [IU] via INTRAVENOUS
  Filled 2020-03-13: qty 4500

## 2020-03-13 MED ORDER — SUCCINYLCHOLINE CHLORIDE 20 MG/ML IJ SOLN
100.0000 mg | Freq: Once | INTRAMUSCULAR | Status: AC
  Administered 2020-03-13: 100 mg via INTRAVENOUS
  Filled 2020-03-13: qty 5

## 2020-03-13 MED ORDER — SODIUM CHLORIDE 0.9% FLUSH: 3.0000 mL | INTRAVENOUS | Status: DC | PRN

## 2020-03-13 MED ORDER — PIPERACILLIN-TAZOBACTAM 3.375 G IVPB
3.3750 g | Freq: Once | INTRAVENOUS | Status: DC
  Administered 2020-03-13: 3.375 g via INTRAVENOUS
  Filled 2020-03-13: qty 50

## 2020-03-13 MED ORDER — DEXTROSE 50 % IV SOLN
1.0000 | Freq: Once | INTRAVENOUS | Status: AC
  Administered 2020-03-13: 50 mL via INTRAVENOUS
  Filled 2020-03-13: qty 50

## 2020-03-13 MED ORDER — DEXTROSE 50 % IV SOLN
INTRAVENOUS | Status: AC
  Administered 2020-03-13: 50 mL
  Filled 2020-03-13: qty 50

## 2020-03-13 MED ORDER — PIPERACILLIN-TAZOBACTAM 3.375 G IVPB
3.3750 g | Freq: Four times a day (QID) | INTRAVENOUS | Status: DC
  Administered 2020-03-13: 3.375 g via INTRAVENOUS
  Filled 2020-03-13: qty 50

## 2020-03-13 MED ORDER — IOHEXOL 350 MG/ML SOLN
100.0000 mL | Freq: Once | INTRAVENOUS | Status: AC | PRN
  Administered 2020-03-13: 100 mL via INTRAVENOUS

## 2020-03-13 MED ORDER — PIPERACILLIN-TAZOBACTAM 3.375 G IVPB
3.3750 g | Freq: Three times a day (TID) | INTRAVENOUS | Status: DC
  Administered 2020-03-14 – 2020-03-15 (×6): 3.375 g via INTRAVENOUS
  Filled 2020-03-13 (×6): qty 50

## 2020-03-13 MED ORDER — DOCUSATE SODIUM 100 MG PO CAPS: 100.0000 mg | ORAL_CAPSULE | Freq: Two times a day (BID) | ORAL | Status: DC | PRN

## 2020-03-13 MED ORDER — ASPIRIN EC 81 MG PO TBEC
81.0000 mg | DELAYED_RELEASE_TABLET | Freq: Every day | ORAL | Status: DC
  Administered 2020-03-13: 81 mg via ORAL
  Filled 2020-03-13 (×2): qty 1

## 2020-03-13 MED ORDER — ACYCLOVIR 400 MG PO TABS
400.0000 mg | ORAL_TABLET | Freq: Two times a day (BID) | ORAL | Status: DC
  Administered 2020-03-13 – 2020-03-18 (×10): 400 mg via ORAL
  Filled 2020-03-13 (×12): qty 1

## 2020-03-13 MED ORDER — IPRATROPIUM-ALBUTEROL 0.5-2.5 (3) MG/3ML IN SOLN: 3.0000 mL | Freq: Four times a day (QID) | RESPIRATORY_TRACT | Status: DC | PRN

## 2020-03-13 MED ORDER — DEXTROSE 10 % IV SOLN: INTRAVENOUS | Status: DC

## 2020-03-13 MED ORDER — ONDANSETRON HCL 4 MG/2ML IJ SOLN: 4.0000 mg | Freq: Four times a day (QID) | INTRAMUSCULAR | Status: DC | PRN

## 2020-03-13 MED ORDER — DEXTROSE 50 % IV SOLN
INTRAVENOUS | Status: AC
  Filled 2020-03-13: qty 50

## 2020-03-13 MED ORDER — PROPOFOL 10 MG/ML IV BOLUS
INTRAVENOUS | Status: AC
  Filled 2020-03-13: qty 20

## 2020-03-13 NOTE — H&P (Addendum)
NAME:  [name], MRN:  [id number], DOB:  Jan 10, 1956, LOS: 0 ADMISSION DATE:  03/13/2020, CONSULTATION DATE:  03/13/20 REFERRING MD:  ED, CHIEF COMPLAINT:  Respiratory distress (per wife)   Brief History   64 year old with HTN, DM, MM on daratumumab who presents to ED after wife found him gurgling in bed subsequently intubated in ED for respiratory distress admitted to the ICU for ongoing care  History of present illness   Patient intubated and sedated. History per EMR and wife at bedside. Wife was having hot flash so slept in other bedroom. She came ion bedroom in the morning and found him gurgling. Room smelled foul. Brownish liquid noted on sheets. Blood was around his mouth. EMS was called. Upon arrival to ED he was hypoxemic and altered. He was intubated. CTA demonstrated dense bilateral lower lobe consolidations and scattered nodular central GGOs in RUL as well as small subsegmental PE noted on an addendum. Hgb 10 above recent values in 8s. No blood seen when intubating and none in inline suction. Leukocytosis to 15k. Lactic acid 5.9 improved to 1.7 after IVFs.   He was given vanc/zosyn. Persistently hypoglycemic so D10 drip started. Oxygen weaning on vent. Bps low normal. Abdominal film for OG tube demonstrated bilateral hydro. Foley placed with ~1L immediately removed.   Past Medical History  Unable to obtain due to patient factors  Significant Hospital Events   Admitted intubated 9/1  Consults:  PCCM  Procedures:  EET 9/1  Significant Diagnostic Tests:  CTA chest 9/1 bilateral lower lobe consolidations, subsegmental PE  Micro Data:  pending  Antimicrobials:  vanc 9/1 Zozyn 9/1 >>   Interim history/subjective:  n/a  Objective   Blood pressure 128/69, pulse 78, temperature 99.8 F (37.7 C), temperature source Rectal, resp. rate 20, height $RemoveBe'5\' 10"'ISRbVgJRD$  (1.778 m), weight 95.3 kg, SpO2 100 %.    Vent Mode: PRVC FiO2 (%):  [40 %-100 %] 40 % Set Rate:  [20 bmp-22 bmp]  20 bmp Vt Set:  [580 mL] 580 mL PEEP:  [5 cmH20] 5 cmH20 Plateau Pressure:  [16 cmH20-19 cmH20] 19 cmH20   Intake/Output Summary (Last 24 hours) at 03/13/2020 1333 Last data filed at 03/13/2020 1225 Gross per 24 hour  Intake 2200 ml  Output --  Net 2200 ml   Filed Weights   03/13/20 0641  Weight: 95.3 kg    Examination: General: Intubated, sedated HENT: dried blood, MMM Lungs: diminished in bases, coarse breath sounds  Cardiovascular: RRR, no murmur Abdomen: obese, BS present Extremities: warm, no edema Neuro: sedated  Resolved Hospital Problem list   n/a  Assessment & Plan:  Acute hypoxemic respiratory failure due to aspiration pneumonia: Found in bed, liquid substance in bed beside him.  Room smells of vomit when entering.  Bilateral dense lower lobe consolidations. --Lower respiratory culture --Zosyn --Ventilator support, 6-8 cc per kilogram, VAP bundle, wean oxygen as tolerated  Severe sepsis: Leukocytosis, tachypnea, tachycardia with hypoxemia.  Due to aspiration pneumonia. --Antibiotics as above --Status post 30 cc/kg with improved lactate  Subsegmental PE: --Heparin GTT --Trend hemoglobins  Toxic Metabolic Encephalopathy: due to hypoxemia, sepsis. --Supportive care  as above  Anemia: Baseline anemia due to multiple myeloma.  Hemoglobin above baseline on admission, suspect due to hypovolemia.  Repeat down to 7 from baseline eights.  No active sign of bleeding.  Dried blood on mouth but no sign of blood in stomach via OG tube. --Trend hemoglobin, continue heparin for now  AKI: Multifactorial, hypovolemic.  Also component of urinary retention given bilateral hydronephrosis seen on abdominal film and 1 L of urine removed with Foley placed. --Trend creatinine, avoid nephrotoxic agents  Hypertension: On 4 agents at baseline.  Blood pressure soft on arrival. --Hold home antihypertensives.  Multiple myeloma: with anemia. --Trend CBC  Best practice:  Diet:  TFs Pain/Anxiety/Delirium protocol (if indicated): propofol VAP protocol (if indicated): per protocol DVT prophylaxis: hep gtt GI prophylaxis: pepcid Glucose control: hypoglycemic, no insulin for now Mobility: bed rest Code Status: Full Family Communication: at bedside Disposition: ICU  Labs   CBC: Recent Labs  Lab 03/13/20 0642 03/13/20 0648 03/13/20 0834  WBC 15.3*  --   --   NEUTROABS 12.0*  --   --   HGB 10.3* 11.2* 10.2*  HCT 35.1* 33.0* 30.0*  MCV 84.4  --   --   PLT 415*  --   --     Basic Metabolic Panel: Recent Labs  Lab 03/13/20 0642 03/13/20 0648 03/13/20 0834  NA 140 141 140  K 4.7 4.5 3.2*  CL 103 106  --   CO2 20*  --   --   GLUCOSE 50* 50*  --   BUN 18 23  --   CREATININE 1.79* 1.60*  --   CALCIUM 8.7*  --   --    GFR: Estimated Creatinine Clearance: 54 mL/min (A) (by C-G formula based on SCr of 1.6 mg/dL (H)). Recent Labs  Lab 03/13/20 0642 03/13/20 1103  WBC 15.3*  --   LATICACIDVEN 5.9* 1.7    Liver Function Tests: Recent Labs  Lab 03/13/20 0642  AST 18  ALT 11  ALKPHOS 61  BILITOT 0.6  PROT 7.0  ALBUMIN 2.7*   No results for input(s): LIPASE, AMYLASE in the last 168 hours. Recent Labs  Lab 03/13/20 1025  AMMONIA 23    ABG    Component Value Date/Time   PHART 7.590 (H) 03/13/2020 0834   PCO2ART 28.9 (L) 03/13/2020 0834   PO2ART 332 (H) 03/13/2020 0834   HCO3 27.7 03/13/2020 0834   TCO2 29 03/13/2020 0834   O2SAT 100.0 03/13/2020 0834     Coagulation Profile: Recent Labs  Lab 03/13/20 0642  INR 1.1    Cardiac Enzymes: No results for input(s): CKTOTAL, CKMB, CKMBINDEX, TROPONINI in the last 168 hours.  HbA1C: No results found for: HGBA1C  CBG: Recent Labs  Lab 03/13/20 0916 03/13/20 1202 03/13/20 1310  GLUCAP 107* 19* 87    Review of Systems:   Unable to obtain due to patient factors  Past Medical History  Unable to obtain due to patient factors   Surgical History   Unable to obtain due to  patient factors   Social History    Unable to obtain due to patient factors   Family History   Unable to obtain due to patient factors   Allergies Unable to obtain due to patient factors   Home Medications  Prior to Admission medications   Medication Sig Start Date End Date Taking? Authorizing Provider  acyclovir (ZOVIRAX) 400 MG tablet Take 400 mg by mouth in the morning and at bedtime. 11/07/19  Yes [provider]  amLODipine (NORVASC) 5 MG tablet Take 5 mg by mouth daily. 01/09/20  Yes [provider]  aspirin EC 81 MG tablet Take 81 mg by mouth daily. Swallow whole.   Yes [provider]  atorvastatin (LIPITOR) 80 MG tablet Take 80 mg by mouth daily.   Yes [provider]  carvedilol (  COREG) 25 MG tablet Take 25 mg by mouth 2 (two) times daily with a meal.   Yes [provider]  dexamethasone (DECADRON) 4 MG tablet Take 12 mg by mouth See admin instructions. Take after chemo treatments   Yes [provider]  hydrALAZINE (APRESOLINE) 50 MG tablet Take 50 mg by mouth 3 (three) times daily. 01/31/20  Yes [provider]  isosorbide mononitrate (IMDUR) 30 MG 24 hr tablet Take 30 mg by mouth daily. 01/10/20  Yes [provider]  losartan (COZAAR) 100 MG tablet Take 100 mg by mouth daily. 01/10/20  Yes [provider]  metFORMIN (GLUCOPHAGE) 500 MG tablet Take 1,000 mg by mouth daily with breakfast.   Yes [provider]  ondansetron (ZOFRAN) 8 MG tablet Take 8 mg by mouth every 8 (eight) hours as needed for nausea or vomiting.   Yes [provider]  predniSONE (DELTASONE) 20 MG tablet Take 20 mg by mouth daily as needed (arthiritis).   Yes [provider]  prochlorperazine (COMPAZINE) 10 MG tablet Take 10 mg by mouth every 6 (six) hours as needed for nausea or vomiting.   Yes [provider]     CRITICAL CARE Performed by: Bonna Gains Dadrian Ballantine   Total critical care time:  55 minutes  Critical care time was exclusive of separately billable procedures and treating other patients.  Critical care was necessary to treat or prevent imminent or life-threatening deterioration.  Critical care was time spent personally by me on the following activities: development of treatment plan with patient and/or surrogate as well as nursing, discussions with consultants, evaluation of patient's response to treatment, examination of patient, obtaining history from patient or surrogate, ordering and performing treatments and interventions, ordering and review of laboratory studies, ordering and review of radiographic studies, pulse oximetry and re-evaluation of patient's condition.

## 2020-03-13 NOTE — ED Notes (Signed)
Pt CBG was 11, notified Brennen(RN)

## 2020-03-13 NOTE — Consult Note (Signed)
Neurology Consultation Reason for Consult: Decreased responsiveness Referring Physician: Pollina, C  CC: Decreased responsiveness  History is obtained from: EMS  Of NOTE, Pt with another MRN as well [id number]  HPI: [name] is a 64 y.o. male last known well prior to bed who was found by his wife to be having some difficulty with his breathing and decreased responsiveness this morning around 6 AM.  He was gurgling, with some blood dripping down around his mouth.  EMS was concerned that he might be posturing and therefore code stroke was activated.  On arrival, he was satting in the low 80s with agitation and tachycardia therefore was taken emergently for intubation.  With a nonfocal exam, and respiratory distress, it was decided to cancel his code stroke.   LKW: 8/31 prior to bed tpa given?: no, out of   ROS:  Unable to obtain due to altered mental status.   Past medical history: PAD (peripheral artery disease) ([place])       Noncompliance       Hyperlipemia       HTN (hypertension)       Dilated cardiomyopathy ([place])   echo in 2009 showed improvement with a normal EF    Diabetes mellitus without complication (HCC)       Chronic renal insufficiency       CHF (congestive heart failure) (HCC)       CAD (coronary artery disease)   CABG 2002           Multiple myeloma    Family history: DM, HTN  Social History: Unable to assess secondary to patient's altered mental status.    Exam: Current vital signs: BP (!) 221/103   Pulse (!) 122   Resp 19   Ht $R'5\' 10"'mN$  (1.778 m)   Wt 95.3 kg   SpO2 90%   BMI 30.13 kg/m  Vital signs in last 24 hours: Pulse Rate:  [111-122] 122 (09/01 0648) Resp:  [19-38] 19 (09/01 0648) BP: (221)/(103) 221/103 (09/01 0648) SpO2:  [83 %-100 %] 90 % (09/01 0648) Weight:  [95.3 kg] 95.3 kg (09/01 0641)   Physical Exam  Constitutional: Appears well-developed and well-nourished.  Psych: Affect appropriate to situation Eyes: No scleral  injection HENT: He has some dried blood at the corners of his mouth. MSK: no joint deformities.  Cardiovascular: Normal rate and regular rhythm.  Respiratory: Effort normal, non-labored breathing GI: Soft.  No distension. There is no tenderness.  Skin: WDI  Neuro: Mental Status: Patient is awake, but very agitated, he does not clearly follow commands. Cranial Nerves: II: He holds his eyes tightly closed, making assessment of visual fields difficult.  Pupils are equal, round, and reactive to light.   III,IV, VI: He does fixate and track across midline, but when attempting to test VOR, keeps his eyes tightly shut V: No clear asymmetry Motor: He moves all extremities purposefully, no clear asymmetry.  He does not cooperate for formal strength testing Sensory: He response to noxious stimulation in all four extremities  Cerebellar: Does not perform.   I have reviewed labs in epic and the results pertinent to this consultation are: Cr 1.6(at baseline)  Impression: 64 year old male with decreased responsiveness in the setting of hypoxia, tachycardia, hypertension.  Possible etiologies include flash pulmonary edema from hypertension or neurogenic etiologies, seizure with postictal state, pulmonary embolus or other causes of respiratory distress. I think that stroke is relatively unlikely, but will need to rule out ICH given severely elevated BPs.  Recommendations: 1) CT head emergently, if negative and no other cause for his altered mental status is found, would consider MRI 2) EEG 3) ammonia, TSH   Roland Rack, MD [facility] [phone]  If 7pm- 7am, please page neurology on call as listed in [place].

## 2020-03-13 NOTE — ED Notes (Addendum)
Pt intubated by MD Pollina and PA , 0648 , breath sounds bilateral heard by MD  24 AT LIP 25 AT TEETH    Amidate 20mg   pushed at [place] 100mg   pushed at [place] by [place]

## 2020-03-13 NOTE — ED Triage Notes (Signed)
Pt BI EMS from home , wife found pt at 0600 am today gruntng and confused with blood coming from mouth in bed, LKN, 2100 03/12/20. 12 Lead showing ST.   VSS BP 180 Palpated  HR 130 SPO2 RA 70, WITH NRB 90, , WHEEZING UPPER LEFT , RR 30

## 2020-03-13 NOTE — ED Notes (Signed)
Admitting transferred to [facility] per his request

## 2020-03-13 NOTE — Progress Notes (Signed)
[facility] Progress Note Patient Name: [name] DOB: 04-05-1956 MRN: [id number]   Date of Service  03/13/2020  HPI/Events of Note  Hypoglycemia - Several episodes today - 40 --> 53 --> 41. Currently on Tube Feeds + D10W at 75 mL/hour.   eICU Interventions  Plan: 1. Increase D10W IV infusion to 100 mL/hour.      Intervention Category Major Interventions: Other:  Lysle Dingwall 03/13/2020, 10:17 PM

## 2020-03-13 NOTE — Procedures (Signed)
Patient Name: [name]  MRN: [id number]  Epilepsy Attending: Lora Havens  Referring Physician/Provider: Dr. Kathrynn Speed Date: 03/13/2020  Duration: 24.11 mins  Patient history: 64 year old male with decreased responsiveness in the setting of hypoxia, tachycardia, hypertension. EEG to evaluate for seizure  Level of alertness: comatose  AEDs during EEG study: Propofol  Technical aspects: This EEG study was done with scalp electrodes positioned according to the 10-20 International system of electrode placement. Electrical activity was acquired at a sampling rate of 500Hz  and reviewed with a high frequency filter of 70Hz  and a low frequency filter of 1Hz . EEG data were recorded continuously and digitally stored.   Description: EEG showed continuous generalized 2-3Hz  delta slowing with overriding 15-18hz  generalized beta activity. Hyperventilation and photic stimulation were not performed.     ABNORMALITY -Continuous slow, generalized - Excessive beta, generalized  IMPRESSION: This study is suggestive of severe diffuse encephalopathy, nonspecific etiology but could be related to sedation. No seizures or epileptiform discharges were seen throughout the recording.  [name] [name]

## 2020-03-13 NOTE — Progress Notes (Addendum)
ANTICOAGULATION CONSULT NOTE - Follow Up Consult  Pharmacy Consult for Heparin Indication: pulmonary embolus  No Known Allergies  Patient Measurements: Height: 5' 10" (177.8 cm) Weight: 95.3 kg (210 lb) IBW/kg (Calculated) : 73 Heparin Dosing Weight: 92.5 kg  Vital Signs: Temp: 100.1 F (37.8 C) (09/01 1815) Temp Source: Rectal (09/01 1031) BP: 127/51 (09/01 1815) Pulse Rate: 67 (09/01 1815)  Labs: Recent Labs    03/13/20 0642 03/13/20 0642 03/13/20 0648 03/13/20 0648 03/13/20 0834 03/13/20 1025 03/13/20 1702 03/13/20 1740  HGB 10.3*   < > 11.2*   < > 10.2*  --   --  7.2*  HCT 35.1*   < > 33.0*  --  30.0*  --   --  23.3*  PLT 415*  --   --   --   --   --   --   --   APTT 29  --   --   --   --   --   --   --   LABPROT 13.6  --   --   --   --   --   --   --   INR 1.1  --   --   --   --   --   --   --   HEPARINUNFRC  --   --   --   --   --   --  0.38  --   CREATININE 1.79*  --  1.60*  --   --   --  1.76*  --   TROPONINIHS 20*  --   --   --   --  42*  --   --    < > = values in this interval not displayed.    Estimated Creatinine Clearance: 49.1 mL/min (A) (by C-G formula based on SCr of 1.76 mg/dL (H)).   Medical History: History reviewed. No pertinent past medical history.  Assessment: 63 yr old male presented with respiratory distress, multifocal pneumonia and subsegmental PE on CTA.  Pharmacy was consulted to dose heparin. Pt was not on anticoagulation PTA, H/H low, plts elevated (noted hx of multiple myeloma on chemotherapy tx).  Pt had some minor bleeding from mouth on presentation; per ED provider, none present and clear airway on intubation when heparin infusion was started.    Initial heparin level ~3.5 hrs after heparin 4500 units IV bolus X 1, followed by heparin infusion at 1450 units/hr, was 0.38 units/ml, which is within the goal range for this pt. Most recent H/H is 7.2/23.3 (may be dilutional, as pt rec'd >3 L of hydration). Per ED RN, no issues with IV  or bleeding observed at this time.  Goal of Therapy:  Heparin level 0.3-0.7 units/ml Monitor platelets by anticoagulation protocol: Yes   Plan:  Continue heparin infusion at 1450 units/hr Since initial heparin level drawn after only 3.5 hrs of heparin infusion, will check heparin level at 6-hr point Monitor daily heparin level, CBC Monitor for signs/symptoms of bleeding  Diane Goodwin, PharmD, BCPS, FCCP Clinical Pharmacist 03/13/2020 6:42 PM  

## 2020-03-13 NOTE — Progress Notes (Signed)
ANTICOAGULATION CONSULT NOTE - Follow Up Consult  Pharmacy Consult for Heparin Indication: pulmonary embolus  No Known Allergies  Patient Measurements: Height: 5\' 10"  (177.8 cm) Weight: 95.3 kg (210 lb) IBW/kg (Calculated) : 73 Heparin Dosing Weight: 92.5 kg  Vital Signs: Temp: 98.9 F (37.2 C) (09/01 2200) BP: 102/48 (09/01 2200) Pulse Rate: 62 (09/01 2200)  Labs: Recent Labs    03/13/20 4782 03/13/20 9562 03/13/20 0648 03/13/20 0648 03/13/20 0834 03/13/20 1025 03/13/20 1702 03/13/20 1740 03/13/20 2220  HGB 10.3*   < > 11.2*   < > 10.2*  --   --  7.2*  --   HCT 35.1*   < > 33.0*  --  30.0*  --   --  23.3*  --   PLT 415*  --   --   --   --   --   --   --   --   APTT 29  --   --   --   --   --   --   --   --   LABPROT 13.6  --   --   --   --   --   --   --   --   INR 1.1  --   --   --   --   --   --   --   --   HEPARINUNFRC  --   --   --   --   --   --  0.38  --  0.40  CREATININE 1.79*  --  1.60*  --   --   --  1.76*  --   --   TROPONINIHS 20*  --   --   --   --  42*  --   --   --    < > = values in this interval not displayed.    Estimated Creatinine Clearance: 49.1 mL/min (A) (by C-G formula based on SCr of 1.76 mg/dL (H)).   Medical History: History reviewed. No pertinent past medical history.  Assessment: 64 yr old male presented with respiratory distress, multifocal pneumonia and subsegmental PE on CTA.  Pharmacy was consulted to dose heparin. Pt was not on anticoagulation PTA, H/H low, plts elevated (noted hx of multiple myeloma on chemotherapy tx).  Pt had some minor bleeding from mouth on presentation; per ED provider, none present and clear airway on intubation when heparin infusion was started.   -heparin level at goal    Goal of Therapy:  Heparin level 0.3-0.7 units/ml Monitor platelets by anticoagulation protocol: Yes   Plan:  Continue heparin infusion at 1450 units/hr Monitor daily heparin level, CBC Monitor for signs/symptoms of  bleeding  Hildred Laser, PharmD Clinical Pharmacist **Pharmacist phone directory can now be found on [email or web address] ([email or web address]).  Listed under [place].

## 2020-03-13 NOTE — ED Notes (Signed)
eeg at bedside 

## 2020-03-13 NOTE — ED Notes (Signed)
Pt's CBG result was 107. Informed [place] - RN.

## 2020-03-13 NOTE — Progress Notes (Signed)
ANTICOAGULATION CONSULT NOTE - Initial Consult  Pharmacy Consult for heparin Indication: pulmonary embolus  No Known Allergies  Patient Measurements: Height: 5\' 10"  (177.8 cm) Weight: 95.3 kg (210 lb) IBW/kg (Calculated) : 73 Heparin Dosing Weight: 92.5kg  Vital Signs: Temp: 99.8 F (37.7 C) (09/01 1031) Temp Source: Rectal (09/01 1031) BP: 128/69 (09/01 0900) Pulse Rate: 78 (09/01 0900)  Labs: Recent Labs    03/13/20 0642 03/13/20 3013 03/13/20 0648 03/13/20 0834 03/13/20 1025  HGB 10.3*   < > 11.2* 10.2*  --   HCT 35.1*  --  33.0* 30.0*  --   PLT 415*  --   --   --   --   APTT 29  --   --   --   --   LABPROT 13.6  --   --   --   --   INR 1.1  --   --   --   --   CREATININE 1.79*  --  1.60*  --   --   TROPONINIHS 20*  --   --   --  42*   < > = values in this interval not displayed.    Estimated Creatinine Clearance: 54 mL/min (A) (by C-G formula based on SCr of 1.6 mg/dL (H)).   Medical History: History reviewed. No pertinent past medical history.  Assessment: 66 YOM presenting with respiratory distress, multifocal pna and subsegmental PE on CTA.  Not on anticoagulation PTA, H/H low but stable, plts elevated (noted hx of multiple myeloma on chemotherapy tx).  Some minor bleeding from mouth on presentation, none at present with clear airway on intubation per ED provider.    Goal of Therapy:  Heparin level 0.3-0.7 units/ml Monitor platelets by anticoagulation protocol: Yes   Plan:  Heparin 4500 units IV x 1, and gtt at 1450 units/hr F/u 6 hour heparin level  Bertis Ruddy, PharmD Clinical Pharmacist ED Pharmacist Phone # [phone] 03/13/2020 12:20 PM

## 2020-03-13 NOTE — ED Provider Notes (Signed)
[facility] EMERGENCY DEPARTMENT Provider Note   CSN: [id number] Arrival date & time: 03/13/20  0631     History Chief Complaint  Patient presents with  . Respiratory Distress    [name] is a 64 y.o. male with a history of multiple myeloma on chemotherapy (most recent tx 03/05/20), CHF last EF 50-55%, CAD, CKD, DM, hypertension, hyperlipidemia, & PAD who presents to the ED via EMS for evaluation of respiratory distress noted @ 0600 this AM as well as EMS activated code stroke PTA.   Per EMS received call for respiratory distress that was noted by patient's wife @ 0600 this AM. Upon EMS arrival patient SpO2 in the 70s on RA, applied NRB with improvement to upper 80s to 90%. They noted some LUE weakness, patient with garbled speech, & activated code stroke in the field. I called & spoke with patient's wife for additional history after initial assessment/interventions- she relays that yesterday he did have some hypoglycemia at the doctor but was otherwise in his normal state of health. They were sleeping separately when she heard some abnormal breathing sounds, she checked on the patient and he was grunting and having a very hard time breathing, she also noted some brown content on the sheets where he was laying.  Patient in respiratory distress, agitated, not following commands.  Level 5 caveat applied secondary to patient's respiratory distress.    HPI     History reviewed. No pertinent past medical history.  There are no problems to display for this patient.   History reviewed. No pertinent surgical history.     History reviewed. No pertinent family history.  Social History   Tobacco Use  . Smoking status: Not on file  Substance Use Topics  . Alcohol use: Not on file  . Drug use: Not on file    Home Medications Prior to Admission medications   Not on File    Allergies    Patient has no allergy information on record.  Review of Systems     Review of Systems  Unable to perform ROS: Severe respiratory distress    Physical Exam Updated Vital Signs BP (!) 221/103   Pulse (!) 122   Resp 19   Ht $R'5\' 10"'UG$  (1.778 m)   Wt 95.3 kg   SpO2 90%   BMI 30.13 kg/m   Physical Exam Vitals and nursing note reviewed.  Constitutional:      General: He is in acute distress.     Appearance: He is ill-appearing.     Comments: Patient is awake, agitated, appears very uncomfortable.   HENT:     Head: Normocephalic.     Mouth/Throat:     Comments: Dried blood present around the mouth. Does not appear to have significant active bleeding.  Eyes:     Pupils: Pupils are equal, round, and reactive to light.  Cardiovascular:     Rate and Rhythm: Regular rhythm. Tachycardia present.  Pulmonary:     Effort: Tachypnea and respiratory distress present.     Comments: Poor air movement. Scattered rhonchi noted. Grunting. SpO2 in the upper 80s on 15L NRB.  Abdominal:     Tenderness: There is no abdominal tenderness. There is no guarding or rebound.  Musculoskeletal:     Cervical back: Neck supple. No rigidity.     Right lower leg: No edema.     Left lower leg: No edema.  Neurological:     Comments: Alert.  Pupils equal round reactive to  light, patient squeezing his eyes closed when attempting further assessment.  Moving all extremities.  Withdrawals from pain.  Not following commands limiting assessment.  Neurologist Dr. Leonel Ramsay is at the bedside  Psychiatric:        Behavior: Behavior is agitated.    ED Results / Procedures / Treatments   Labs (all labs ordered are listed, but only abnormal results are displayed) Labs Reviewed  CBC - Abnormal; Notable for the following components:      Result Value   WBC 15.3 (*)    RBC 4.16 (*)    Hemoglobin 10.3 (*)    HCT 35.1 (*)    MCH 24.8 (*)    MCHC 29.3 (*)    RDW 19.0 (*)    Platelets 415 (*)    All other components within normal limits  DIFFERENTIAL - Abnormal; Notable for the  following components:   Neutro Abs 12.0 (*)    Monocytes Absolute 1.7 (*)    Abs Immature Granulocytes 0.14 (*)    All other components within normal limits  COMPREHENSIVE METABOLIC PANEL - Abnormal; Notable for the following components:   CO2 20 (*)    Glucose, Bld 50 (*)    Creatinine, Ser 1.79 (*)    Calcium 8.7 (*)    Albumin 2.7 (*)    GFR calc non Af Amer 39 (*)    GFR calc Af Amer 45 (*)    Anion gap 17 (*)    All other components within normal limits  BRAIN NATRIURETIC PEPTIDE - Abnormal; Notable for the following components:   B Natriuretic Peptide 419.0 (*)    All other components within normal limits  LACTIC ACID, PLASMA - Abnormal; Notable for the following components:   Lactic Acid, Venous 5.9 (*)    All other components within normal limits  I-STAT CHEM 8, ED - Abnormal; Notable for the following components:   Creatinine, Ser 1.60 (*)    Glucose, Bld 50 (*)    Calcium, Ion 1.02 (*)    Hemoglobin 11.2 (*)    HCT 33.0 (*)    All other components within normal limits  CBG MONITORING, ED - Abnormal; Notable for the following components:   Glucose-Capillary 107 (*)    All other components within normal limits  CBG MONITORING, ED - Abnormal; Notable for the following components:   Glucose-Capillary 19 (*)    All other components within normal limits  I-STAT ARTERIAL BLOOD GAS, ED - Abnormal; Notable for the following components:   pH, Arterial 7.590 (*)    pCO2 arterial 28.9 (*)    pO2, Arterial 332 (*)    Acid-Base Excess 6.0 (*)    Potassium 3.2 (*)    Calcium, Ion 1.09 (*)    HCT 30.0 (*)    Hemoglobin 10.2 (*)    All other components within normal limits  TROPONIN I (HIGH SENSITIVITY) - Abnormal; Notable for the following components:   Troponin I (High Sensitivity) 20 (*)    All other components within normal limits  TROPONIN I (HIGH SENSITIVITY) - Abnormal; Notable for the following components:   Troponin I (High Sensitivity) 42 (*)    All other  components within normal limits  SARS CORONAVIRUS 2 BY RT PCR (HOSPITAL ORDER, [facility])  CULTURE, BLOOD (ROUTINE X 2)  CULTURE, BLOOD (ROUTINE X 2)  ETHANOL  PROTIME-INR  APTT  AMMONIA  TSH  LACTIC ACID, PLASMA  RAPID URINE DRUG SCREEN, HOSP PERFORMED  URINALYSIS, ROUTINE  W REFLEX MICROSCOPIC  BLOOD GAS, ARTERIAL  LACTIC ACID, PLASMA  CBG MONITORING, ED    EKG EKG Interpretation  Date/Time:  Wednesday March 13 2020 07:10:26 EDT Ventricular Rate:  93 PR Interval:    QRS Duration: 78 QT Interval:  378 QTC Calculation: 471 R Axis:   -27 Text Interpretation: Sinus rhythm Borderline left axis deviation Confirmed by Veryl Speak [phone]) on 03/13/2020 12:22:18 PM   Radiology CT HEAD WO CONTRAST  Result Date: 03/13/2020 CLINICAL DATA:  Neuro deficit. Unresponsive, found down now intubated. EXAM: CT HEAD WITHOUT CONTRAST TECHNIQUE: Contiguous axial images were obtained from the base of the skull through the vertex without intravenous contrast. COMPARISON:  None FINDINGS: Brain: No evidence of acute infarction, hemorrhage, hydrocephalus, extra-axial collection or mass lesion/mass effect. 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 Vascular: No hyperdense vessel or unexpected calcification. Skull: Normal. Negative for fracture or focal lesion. Sinuses/Orbits: Mild mucosal thickening of the RIGHT maxillary sinus. Fluid in the nasopharynx likely related to endotracheal tube. Scattered ethmoid opacification as well. Other: None IMPRESSION: 1. No acute intracranial abnormality. 2. 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 Electronically Signed   By: Zetta Bills M.D.   On: 03/13/2020 10:31   CT Angio Chest PE W/Cm &/Or Wo Cm  Addendum Date: 03/13/2020   ADDENDUM REPORT: 03/13/2020 12:02 ADDENDUM: On image 148 of series 4 there is a subtle filling  defect in subsegmental branches to the lingula also seen on image 117 of series 7 compatible with small peripheral, subsegmental pulmonary embolus. This represents a change from the initial report. These results were called by telephone at the time of interpretation on 03/13/2020 at 12:02 pm to provider [facility] , who verbally acknowledged these results. Electronically Signed   By: Zetta Bills M.D.   On: 03/13/2020 12:02   Result Date: 03/13/2020 CLINICAL DATA:  PE suspected. EXAM: CT ANGIOGRAPHY CHEST WITH CONTRAST TECHNIQUE: Multidetector CT imaging of the chest was performed using the standard protocol during bolus administration of intravenous contrast. Multiplanar CT image reconstructions and MIPs were obtained to evaluate the vascular anatomy. CONTRAST:  133mL OMNIPAQUE IOHEXOL 350 MG/ML SOLN COMPARISON:  Chest x-ray of the same date. Previous chest CT from 2006. FINDINGS: Cardiovascular: Study quality is adequate. No sign of pulmonary embolism. Post median sternotomy for CABG with three-vessel native coronary artery disease. Cardiac enlargement with LEFT ventricular hypertrophy without pericardial effusion. Mediastinum/Nodes: Endotracheal tube terminates in the mid trachea as seen on the recent chest x-ray. Esophagus mildly patulous. No thoracic inlet adenopathy. No axillary adenopathy. Lungs/Pleura: Basilar airspace consolidation and patchy areas of ground-glass attenuation. Ground-glass changes are worse in the RIGHT chest. Consolidative changes are seen dependently in the lung bases with patent airways, large airways. Peripheral airways are fluid-filled. Hypoenhancement of parenchyma is seen bilaterally. No pleural effusion. Discrete ground-glass nodules are present, greater than 10 in the RIGHT upper lobe, example is demonstrated on image 27 of series 5 with a 9 mm ground-glass nodule. While there are ground-glass changes in the lingula. There are no areas of nodularity in the LEFT chest.  Upper Abdomen: Incidental imaging of upper abdominal contents is unremarkable for acute process with very limited coverage. Musculoskeletal: Mild body wall edema. Degenerative changes of the spine. No acute musculoskeletal process. Signs of median sternotomy. Review of the MIP images confirms the above findings. IMPRESSION: 1. Negative for pulmonary embolism. 2. Basilar airspace consolidation bilaterally slightly  worse on the RIGHT, associated with areas of ground-glass opacity compatible with multifocal pneumonia. 3. Some of these ground-glass areas are more nodular and discrete particularly in the RIGHT upper lobe. Suggest follow-up CT in 3 months to ensure resolution. 4. Cardiac enlargement with LEFT ventricular hypertrophy. Post median sternotomy for CABG with three-vessel native coronary artery disease. 5. Endotracheal tube terminates in the mid as seen on the recent chest x-ray. 6. Mild body wall edema. 7. Aortic atherosclerosis. Aortic Atherosclerosis (ICD10-I70.0). Electronically Signed: By: Zetta Bills M.D. On: 03/13/2020 10:44   DG Chest Port 1 View  Result Date: 03/13/2020 CLINICAL DATA:  Endotracheal tube placement. EXAM: PORTABLE CHEST 1 VIEW COMPARISON:  11/25/2017 FINDINGS: 0706 hours. Endotracheal tube tip is approximately 3.6 cm above the base of the carina. The cardio pericardial silhouette is enlarged. Low lung volumes with focal airspace opacity in the right mid lung and retrocardiac left base. Possible tiny left pleural effusion. The visualized bony structures of the thorax show no acute abnormality. Telemetry leads overlie the chest. IMPRESSION: 1. Endotracheal tube tip approximately 3.6 cm above the base of the carina. 2. Low lung volumes with focal airspace disease in the right mid lung and retrocardiac left base. Imaging features suspicious for multifocal pneumonia. Electronically Signed   By: Misty Stanley M.D.   On: 03/13/2020 07:27   EEG adult  Result Date: 03/13/2020 Lora Havens, MD     03/13/2020 10:08 AM Patient Name: [name] MRN: [id number] Epilepsy Attending: Lora Havens Referring Physician/Provider: Dr. Kathrynn Speed Date: 03/13/2020 Duration: 24.11 mins Patient history: 64 year old male with decreased responsiveness in the setting of hypoxia, tachycardia, hypertension. EEG to evaluate for seizure Level of alertness: comatose AEDs during EEG study: Propofol Technical aspects: This EEG study was done with scalp electrodes positioned according to the 10-20 International system of electrode placement. Electrical activity was acquired at a sampling rate of 500Hz  and reviewed with a high frequency filter of 70Hz  and a low frequency filter of 1Hz . EEG data were recorded continuously and digitally stored. Description: EEG showed continuous generalized 2-3Hz  delta slowing with overriding 15-18hz  generalized beta activity. Hyperventilation and photic stimulation were not performed.   ABNORMALITY -Continuous slow, generalized - Excessive beta, generalized IMPRESSION: This study is suggestive of severe diffuse encephalopathy, nonspecific etiology but could be related to sedation. No seizures or epileptiform discharges were seen throughout the recording. Lora Havens    Procedures Procedure Name: Intubation Date/Time: 03/13/2020 6:42 AM Performed by: Amaryllis Dyke, PA-C Pre-anesthesia Checklist: Patient identified, Patient being monitored, Emergency Drugs available, Timeout performed and Suction available Oxygen Delivery Method: Non-rebreather mask Preoxygenation: Pre-oxygenation with 100% oxygen Induction Type: Rapid sequence Ventilation: Mask ventilation without difficulty Laryngoscope Size: Glidescope Tube size: 8.0 mm Number of attempts: 1 Airway Equipment and Method: Stylet Placement Confirmation: ETT inserted through vocal cords under direct vision,  CO2 detector and Breath sounds checked- equal and bilateral Secured at: 24 (@ the lip)  cm Dental Injury: Teeth and Oropharynx as per pre-operative assessment  Comments: Intubation performed with supervising physician Dr. Betsey Holiday @ bedside.      .Critical Care Performed by: Amaryllis Dyke, PA-C Authorized by: Amaryllis Dyke, PA-C    CRITICAL CARE Performed by: Kennith Maes   Total critical care time: 75 minutes  Critical care time was exclusive of separately billable procedures and treating other patients.  Critical care was necessary to treat or prevent imminent or life-threatening deterioration.  Critical care was time spent personally by me  on the following activities: development of treatment plan with patient and/or surrogate as well as nursing, discussions with consultants, evaluation of patient's response to treatment, examination of patient, obtaining history from patient or surrogate, ordering and performing treatments and interventions, ordering and review of laboratory studies, ordering and review of radiographic studies, pulse oximetry and re-evaluation of patient's condition.    (including critical care time)  Medications Ordered in ED Medications  propofol (DIPRIVAN) 1000 MG/100ML infusion (40 mcg/kg/min  95.3 kg Intravenous Rate/Dose Change 03/13/20 0940)  piperacillin-tazobactam (ZOSYN) IVPB 3.375 g (3.375 g Intravenous New Bag/Given 03/13/20 1040)  sodium chloride flush (NS) 0.9 % injection 3 mL (3 mLs Intravenous Not Given 03/13/20 1220)  sodium chloride flush (NS) 0.9 % injection 3 mL (has no administration in time range)  dextrose 10 % infusion (100 mLs Intravenous New Bag/Given 03/13/20 1217)  heparin bolus via infusion 4,500 Units (has no administration in time range)  heparin ADULT infusion 100 units/mL (25000 units/242mL sodium chloride 0.45%) (has no administration in time range)  etomidate (AMIDATE) injection 20 mg (20 mg Intravenous Given 03/13/20 0645)  succinylcholine (ANECTINE) injection 100 mg (100 mg Intravenous Given by  Other 03/13/20 0646)  dextrose 50 % solution 50 mL (50 mLs Intravenous Given 03/13/20 0742)  vancomycin (VANCOCIN) IVPB 1000 mg/200 mL premix (0 mg Intravenous Stopped 03/13/20 1148)  sodium chloride 0.9 % bolus 2,000 mL (2,000 mLs Intravenous New Bag/Given 03/13/20 1032)  iohexol (OMNIPAQUE) 350 MG/ML injection 100 mL (100 mLs Intravenous Contrast Given 03/13/20 1015)  sodium chloride 0.9 % bolus 1,000 mL (1,000 mLs Intravenous New Bag/Given 03/13/20 1208)  dextrose 50 % solution 50 mL (50 mLs Intravenous Given 03/13/20 1207)    ED Course  I have reviewed the triage vital signs and the nursing notes.  Pertinent labs & imaging results that were available during my care of the patient were reviewed by me and considered in my medical decision making (see chart for details).    [name] was evaluated in Emergency Department on 03/13/2020 for the symptoms described in the history of present illness. He/she was evaluated in the context of the global COVID-19 pandemic, which necessitated consideration that the patient might be at risk for infection with the SARS-CoV-2 virus that causes COVID-19. Institutional protocols and algorithms that pertain to the evaluation of patients at risk for COVID-19 are in a state of rapid change based on information released by regulatory bodies including the CDC and federal and state organizations. These policies and algorithms were followed during the patient's care in the ED.  MDM Rules/Calculators/A&P                         Patient presents to the emergency department via EMS for respiratory distress & code stroke activation by EMS. Myself, Dr. Betsey Holiday, & neurologist Dr. Leonel Ramsay present upon patient rooming. Patient is in respiratory distress, he is tachypneic & SpO2 is in the upper 80s on RA, he is also agitated- decision made to intubate per procedure note above. Hypertensive. Started on propofol for sedation.  I-STAT Chem-8 with hypoglycemia at 50-1 amp of D50 has  been ordered with information relayed to nursing staff, do not feel that this was causing patient's respiratory distress but may have been contributing to his altered mental status.  Decision made for code stroke cancellation, per neurology CT head, potentially may need MRI, will also obtain EEG.  Additional history obtained:  Additional history obtained from patient's wife and EMS.  Previous records obtained and reviewed via chart merged.  EKG: Sinus rhythm Borderline left axis deviation  Lab Tests:  I Ordered, reviewed, and interpreted labs, which included:  CBC: Leukocytosis of 15.3.  Mild anemia improved from prior on record. CMP: Mildly uptrending creatinine. Similar noted hypoglycemia- same blood draw.  PT/INR/APTT: No significant abnormality.  Ethanol: WNL Ammonia: WNL TSH: WNL BNP: Mildly elevated Troponin: Mild elevation Lactic acid: Elevated @ 5.9 initially.  COVID: Negative   Imaging Studies ordered:  I ordered imaging studies which included chest x-ray, CT head, and CT angio of the chest (clarified no CTA by neurology, code stroke cancelled- possible MRI) CXR: ET tube in place, concern for multifocal pneumonia.  CT head: No acute process, no bleed.  CTA: Multifocal pneumonia again noted.   EEG ordered by neurology team: This study is suggestive of severe diffuse encephalopathy, nonspecific etiology but could be related to sedation. No seizures or epileptiform discharges were seen throughout the recording  ED courses:  Hypoglycemic on arrival, amp of D50 ordered. 08:30: Initial lactic acid elevated at 5.9, Attending has started 2L fluids & broad spectrum abx with potential concern for sepsis, BP without hypotension, continue to monitor.  09:17: CBG improved to 107, will continue to monitor, q1 hour check ordered.   With multifocal pneumonia on CT angio as well, leukocytosis, initial tachycardia, and elevated lactic acid code sepsis was activated, patient has 2L of fluid  ordered thus far- additional 1 L of fluids ordered for complete 30 cc/kg bolus, patient has already received antibiotics at this time. Concern for possible aspiration pneumonia based on review of CT imaging as well as history of brown content on sheets prior to arrival, with intubation no significant blood or gastric content visualized.   11:20: CONSULT: Discussed with critical care team who will come to the emergency department to evaluate and admit the patient. 11:50: Lactic acid normalized w/ fluids.  12:01: CONSULT: Discussed with radiologist, upon second review of patient CT angio of the chest he does note a subsegmental pulmonary embolism.  We will start heparin per pharmacy consult.   12:02: Patient with recurrent hypoglycemia CBG 19, will order additional amp of D50 and start on D10 infusion. 12:07: CONSULT: Re-discussed w/ critical care attending Dr. Silas Flood regarding PE & glucose- in agreement with above interventions.  13:10: CBG Improved some.   Patient admitted to the critical care service with acute hypoxic respiratory failure requiring intubation in the setting of multifocal pneumonia with sepsis, recurrent hypoglycemia, and subsegmental pulmonary embolism.  Sepsis - Repeat Assessment Performed at: 13:52 Vitals Blood pressure 102/55, pulse 72, temperature 99.8 F (37.7 C), temperature source Rectal, resp. rate 20, height $RemoveBe'5\' 10"'GmxcNpwDi$  (1.778 m), weight 95.3 kg, SpO2 100 %. Heart: Regular rate and rhythm Lungs:Rhonchi Capillary Refill:<2 sec Peripheral Pulse:Dorsalis pedis pulse  palpable Skin:Dry and warm  This is a shared visit with supervising physician Dr. Betsey Holiday who has personally evaluated patient, additional discussed w/ supervising physician Dr Stark Jock @ shift change- in agreement with care.   Portions of this note were generated with Lobby[employer]. Dictation errors may occur despite best attempts at proofreading.   Final Clinical Impression(s) / ED  Diagnoses Final diagnoses:  Acute hypoxemic respiratory failure ([place])  Multifocal pneumonia  Acute pulmonary embolism without acute cor pulmonale, unspecified pulmonary embolism type (HCC)  Hypoglycemia  Sepsis, due to unspecified organism, unspecified whether acute organ dysfunction present [facility])    Rx / DC Orders ED Discharge Orders    None  Amaryllis Dyke, PA-C 03/13/20 1357    Orpah Greek, MD 03/14/20 [phone]

## 2020-03-13 NOTE — Progress Notes (Signed)
EEG complete - results pending 

## 2020-03-14 ENCOUNTER — Inpatient Hospital Stay (HOSPITAL_COMMUNITY): Payer: Managed Care, Other (non HMO)

## 2020-03-14 ENCOUNTER — Inpatient Hospital Stay (HOSPITAL_COMMUNITY): Payer: No Typology Code available for payment source

## 2020-03-14 DIAGNOSIS — J690 Pneumonitis due to inhalation of food and vomit: Secondary | ICD-10-CM | POA: Diagnosis not present

## 2020-03-14 DIAGNOSIS — I2699 Other pulmonary embolism without acute cor pulmonale: Secondary | ICD-10-CM | POA: Diagnosis not present

## 2020-03-14 DIAGNOSIS — J9601 Acute respiratory failure with hypoxia: Secondary | ICD-10-CM | POA: Diagnosis not present

## 2020-03-14 DIAGNOSIS — I2693 Single subsegmental pulmonary embolism without acute cor pulmonale: Secondary | ICD-10-CM

## 2020-03-14 LAB — CBC WITH DIFFERENTIAL/PLATELET
Abs Immature Granulocytes: 0 10*3/uL (ref 0.00–0.07)
Basophils Absolute: 0.1 10*3/uL (ref 0.0–0.1)
Basophils Relative: 1 %
Eosinophils Absolute: 0.2 10*3/uL (ref 0.0–0.5)
Eosinophils Relative: 2 %
HCT: 25 % — ABNORMAL LOW (ref 39.0–52.0)
Hemoglobin: 7.7 g/dL — ABNORMAL LOW (ref 13.0–17.0)
Lymphocytes Relative: 4 %
Lymphs Abs: 0.3 10*3/uL — ABNORMAL LOW (ref 0.7–4.0)
MCH: 24.8 pg — ABNORMAL LOW (ref 26.0–34.0)
MCHC: 30.8 g/dL (ref 30.0–36.0)
MCV: 80.6 fL (ref 80.0–100.0)
Monocytes Absolute: 0.3 10*3/uL (ref 0.1–1.0)
Monocytes Relative: 4 %
Neutro Abs: 7.3 10*3/uL (ref 1.7–7.7)
Neutrophils Relative %: 89 %
Platelets: 261 10*3/uL (ref 150–400)
RBC: 3.1 MIL/uL — ABNORMAL LOW (ref 4.22–5.81)
RDW: 19.6 % — ABNORMAL HIGH (ref 11.5–15.5)
WBC: 8.2 10*3/uL (ref 4.0–10.5)
nRBC: 0 % (ref 0.0–0.2)
nRBC: 0 /100 WBC

## 2020-03-14 LAB — GLUCOSE, CAPILLARY
Glucose-Capillary: 350 mg/dL — ABNORMAL HIGH (ref 70–99)
Glucose-Capillary: 371 mg/dL — ABNORMAL HIGH (ref 70–99)
Glucose-Capillary: 41 mg/dL — CL (ref 70–99)
Glucose-Capillary: 485 mg/dL — ABNORMAL HIGH (ref 70–99)
Glucose-Capillary: 49 mg/dL — ABNORMAL LOW (ref 70–99)
Glucose-Capillary: 490 mg/dL — ABNORMAL HIGH (ref 70–99)
Glucose-Capillary: 501 mg/dL (ref 70–99)
Glucose-Capillary: 51 mg/dL — ABNORMAL LOW (ref 70–99)
Glucose-Capillary: 511 mg/dL (ref 70–99)
Glucose-Capillary: 527 mg/dL (ref 70–99)
Glucose-Capillary: 529 mg/dL (ref 70–99)
Glucose-Capillary: 58 mg/dL — ABNORMAL LOW (ref 70–99)
Glucose-Capillary: 81 mg/dL (ref 70–99)
Glucose-Capillary: 84 mg/dL (ref 70–99)
Glucose-Capillary: 88 mg/dL (ref 70–99)

## 2020-03-14 LAB — ECHOCARDIOGRAM COMPLETE
Area-P 1/2: 3.37 cm2
Height: 70 in
S' Lateral: 3.7 cm
Weight: 3001.78 oz

## 2020-03-14 LAB — MRSA PCR SCREENING: MRSA by PCR: NEGATIVE

## 2020-03-14 LAB — BASIC METABOLIC PANEL
Anion gap: 8 (ref 5–15)
Anion gap: 8 (ref 5–15)
Anion gap: 11 (ref 5–15)
BUN: 16 mg/dL (ref 8–23)
BUN: 13 mg/dL (ref 8–23)
BUN: 17 mg/dL (ref 8–23)
CO2: 22 mmol/L (ref 22–32)
CO2: 21 mmol/L — ABNORMAL LOW (ref 22–32)
CO2: 19 mmol/L — ABNORMAL LOW (ref 22–32)
Calcium: 7.7 mg/dL — ABNORMAL LOW (ref 8.9–10.3)
Calcium: 7.8 mg/dL — ABNORMAL LOW (ref 8.9–10.3)
Calcium: 7.9 mg/dL — ABNORMAL LOW (ref 8.9–10.3)
Chloride: 109 mmol/L (ref 98–111)
Chloride: 110 mmol/L (ref 98–111)
Chloride: 105 mmol/L (ref 98–111)
Creatinine, Ser: 1.6 mg/dL — ABNORMAL HIGH (ref 0.61–1.24)
Creatinine, Ser: 1.7 mg/dL — ABNORMAL HIGH (ref 0.61–1.24)
Creatinine, Ser: 1.9 mg/dL — ABNORMAL HIGH (ref 0.61–1.24)
GFR calc Af Amer: 52 mL/min — ABNORMAL LOW (ref >60)
GFR calc Af Amer: 48 mL/min — ABNORMAL LOW (ref >60)
GFR calc Af Amer: 42 mL/min — ABNORMAL LOW (ref >60)
GFR calc non Af Amer: 45 mL/min — ABNORMAL LOW (ref >60)
GFR calc non Af Amer: 42 mL/min — ABNORMAL LOW (ref >60)
GFR calc non Af Amer: 36 mL/min — ABNORMAL LOW (ref >60)
Glucose, Bld: 123 mg/dL — ABNORMAL HIGH (ref 70–99)
Glucose, Bld: 191 mg/dL — ABNORMAL HIGH (ref 70–99)
Glucose, Bld: 498 mg/dL — ABNORMAL HIGH (ref 70–99)
Potassium: 2.8 mmol/L — ABNORMAL LOW (ref 3.5–5.1)
Potassium: 3.9 mmol/L (ref 3.5–5.1)
Potassium: 4.6 mmol/L (ref 3.5–5.1)
Sodium: 139 mmol/L (ref 135–145)
Sodium: 139 mmol/L (ref 135–145)
Sodium: 135 mmol/L (ref 135–145)

## 2020-03-14 LAB — CBG MONITORING, ED
Glucose-Capillary: 43 mg/dL — CL (ref 70–99)
Glucose-Capillary: 61 mg/dL — ABNORMAL LOW (ref 70–99)
Glucose-Capillary: 64 mg/dL — ABNORMAL LOW (ref 70–99)
Glucose-Capillary: 95 mg/dL (ref 70–99)
Glucose-Capillary: 98 mg/dL (ref 70–99)

## 2020-03-14 LAB — PHOSPHORUS: Phosphorus: 2.9 mg/dL (ref 2.5–4.6)

## 2020-03-14 LAB — HEMOGLOBIN A1C
Hgb A1c MFr Bld: 8.1 % — ABNORMAL HIGH (ref 4.8–5.6)
Mean Plasma Glucose: 185.77 mg/dL

## 2020-03-14 LAB — HEPARIN LEVEL (UNFRACTIONATED): Heparin Unfractionated: 0.41 IU/mL (ref 0.30–0.70)

## 2020-03-14 LAB — HEMOGLOBIN AND HEMATOCRIT, BLOOD
HCT: 28.3 % — ABNORMAL LOW (ref 39.0–52.0)
Hemoglobin: 8.4 g/dL — ABNORMAL LOW (ref 13.0–17.0)

## 2020-03-14 LAB — MAGNESIUM: Magnesium: 1.6 mg/dL — ABNORMAL LOW (ref 1.7–2.4)

## 2020-03-14 MED ORDER — DEXTROSE 50 % IV SOLN
50.0000 mL | Freq: Once | INTRAVENOUS | Status: AC
  Administered 2020-03-14: 50 mL via INTRAVENOUS

## 2020-03-14 MED ORDER — DEXTROSE 50 % IV SOLN
INTRAVENOUS | Status: AC
  Administered 2020-03-14: 50 mL via INTRAVENOUS
  Filled 2020-03-14: qty 50

## 2020-03-14 MED ORDER — INSULIN ASPART 100 UNIT/ML ~~LOC~~ SOLN: 0.0000 [IU] | SUBCUTANEOUS | Status: DC

## 2020-03-14 MED ORDER — OCTREOTIDE ACETATE 50 MCG/ML IJ SOLN
50.0000 ug | Freq: Four times a day (QID) | INTRAMUSCULAR | Status: DC
  Administered 2020-03-14 (×2): 50 ug via SUBCUTANEOUS
  Filled 2020-03-14 (×4): qty 1

## 2020-03-14 MED ORDER — MAGNESIUM SULFATE 2 GM/50ML IV SOLN
2.0000 g | Freq: Once | INTRAVENOUS | Status: AC
  Administered 2020-03-14: 2 g via INTRAVENOUS
  Filled 2020-03-14: qty 50

## 2020-03-14 MED ORDER — DEXTROSE 50 % IV SOLN
25.0000 mL | Freq: Once | INTRAVENOUS | Status: AC
  Administered 2020-03-14: 25 mL via INTRAVENOUS

## 2020-03-14 MED ORDER — DEXTROSE 50 % IV SOLN
INTRAVENOUS | Status: AC
  Administered 2020-03-14: 50 mL
  Filled 2020-03-14: qty 50

## 2020-03-14 MED ORDER — ATORVASTATIN CALCIUM 80 MG PO TABS: 80.0000 mg | ORAL_TABLET | Freq: Every day | ORAL | Status: DC

## 2020-03-14 MED ORDER — INSULIN REGULAR(HUMAN) IN NACL 100-0.9 UT/100ML-% IV SOLN
INTRAVENOUS | Status: DC
  Administered 2020-03-14: 16 [IU]/h via INTRAVENOUS
  Administered 2020-03-15: 21 [IU]/h via INTRAVENOUS
  Filled 2020-03-14 (×3): qty 100

## 2020-03-14 MED ORDER — ORAL CARE MOUTH RINSE
15.0000 mL | Freq: Two times a day (BID) | OROMUCOSAL | Status: DC
  Administered 2020-03-14 – 2020-03-18 (×8): 15 mL via OROMUCOSAL

## 2020-03-14 MED ORDER — ASPIRIN 81 MG PO CHEW
81.0000 mg | CHEWABLE_TABLET | Freq: Every day | ORAL | Status: DC
  Administered 2020-03-14 – 2020-03-18 (×5): 81 mg via ORAL
  Filled 2020-03-14 (×4): qty 1

## 2020-03-14 MED ORDER — FAMOTIDINE 20 MG PO TABS: 20.0000 mg | ORAL_TABLET | Freq: Two times a day (BID) | ORAL | Status: DC

## 2020-03-14 MED ORDER — DEXTROSE 50 % IV SOLN: 0.0000 mL | INTRAVENOUS | Status: DC | PRN

## 2020-03-14 MED ORDER — POTASSIUM CHLORIDE 10 MEQ/100ML IV SOLN
10.0000 meq | INTRAVENOUS | Status: AC
  Administered 2020-03-14 (×7): 10 meq via INTRAVENOUS
  Filled 2020-03-14 (×8): qty 100

## 2020-03-14 MED ORDER — DEXTROSE 50 % IV SOLN
INTRAVENOUS | Status: AC
  Filled 2020-03-14: qty 50

## 2020-03-14 MED ORDER — ATORVASTATIN CALCIUM 80 MG PO TABS
80.0000 mg | ORAL_TABLET | Freq: Every day | ORAL | Status: DC
  Administered 2020-03-14 – 2020-03-18 (×5): 80 mg via ORAL
  Filled 2020-03-14 (×4): qty 1

## 2020-03-14 MED ORDER — OCTREOTIDE ACETATE 50 MCG/ML IJ SOLN
50.0000 ug | Freq: Four times a day (QID) | INTRAMUSCULAR | Status: AC
  Administered 2020-03-14 – 2020-03-15 (×2): 50 ug via SUBCUTANEOUS
  Filled 2020-03-14 (×2): qty 1

## 2020-03-14 MED ORDER — CHLORHEXIDINE GLUCONATE CLOTH 2 % EX PADS
6.0000 | MEDICATED_PAD | Freq: Every day | CUTANEOUS | Status: DC
  Administered 2020-03-14 – 2020-03-18 (×4): 6 via TOPICAL

## 2020-03-14 MED ORDER — METHYLPREDNISOLONE SODIUM SUCC 40 MG IJ SOLR
40.0000 mg | Freq: Two times a day (BID) | INTRAMUSCULAR | Status: DC
  Administered 2020-03-14: 40 mg via INTRAVENOUS
  Filled 2020-03-14: qty 1

## 2020-03-14 MED ORDER — ACETAMINOPHEN 325 MG PO TABS: 650.0000 mg | ORAL_TABLET | Freq: Four times a day (QID) | ORAL | Status: DC | PRN

## 2020-03-14 MED ORDER — DEXTROSE 50 % IV SOLN: 1.0000 | Freq: Once | INTRAVENOUS | Status: AC

## 2020-03-14 MED ORDER — ASPIRIN 81 MG PO CHEW: 81.0000 mg | CHEWABLE_TABLET | Freq: Every day | ORAL | Status: DC

## 2020-03-14 MED ORDER — FAMOTIDINE 20 MG PO TABS
20.0000 mg | ORAL_TABLET | Freq: Two times a day (BID) | ORAL | Status: DC
  Administered 2020-03-14 – 2020-03-18 (×9): 20 mg via ORAL
  Filled 2020-03-14 (×8): qty 1

## 2020-03-14 NOTE — Progress Notes (Signed)
[facility] Progress Note Patient Name: [name] DOB: 11/20/1955 MRN: [id number]   Date of Service  03/14/2020  HPI/Events of Note  Hypoglycemia - Blood glucose = 43. Given D50 by nurse. Tube feeds restarted 2 hours ago.   eICU Interventions  Plan: 1. Increase D10W to 150 mL/hour.      Intervention Category Major Interventions: Other:  Lysle Dingwall 03/14/2020, 12:25 AM

## 2020-03-14 NOTE — ED Provider Notes (Signed)
Patient has been awaiting ICU bed on mechanical ventilation.  He has been in the ER for over 18 hours.  I was informed by nursing staff that patient self extubated.  When I entered the room the patient was coughing but was easily suctioned.  He was placed on nonrebreather mask and he is currently 100% on pulse ox.  Sedation has been stopped and patient is now more awake and alert and responsive to voice. At this time, plan will be to obtain a chest x-ray, ABG, and keep on nonrebreather.  I will not reintubate at this time  I discussed the case with the critical care team who will reevaluate the patient.   Ripley Fraise, MD 03/14/20 [phone]

## 2020-03-14 NOTE — Progress Notes (Signed)
Bilateral lower extremity venous duplex has been completed. Preliminary results can be found in CV Proc through chart review.   03/14/20 2:36 PM Carlos Levering RVT

## 2020-03-14 NOTE — Procedures (Signed)
Extubation Procedure Note  Patient Details:   Name: [name] DOB: 1955/10/10 MRN: [id number]   Airway Documentation:  Airway 8 mm (Active)  Secured at (cm) 24 cm 03/13/20 2329  Measured From Lips 03/13/20 2329  Secured Location Left 03/13/20 2329  Secured By [employer] 03/13/20 2329  Tube Holder Repositioned Yes 03/13/20 2329  Cuff Pressure (cm H2O) 30 cm H2O 03/13/20 1559  Site Condition Cool;Dry 03/13/20 2329   Vent end date: 03/14/20 Vent end time: 0124   Evaluation  O2 sats: stable throughout Complications: No apparent complications Patient did tolerate procedure well. Bilateral Breath Sounds: Clear, Diminished  Patient self extubated, placed patient on 100% NRB. Will obtain arterial blood gas in 20 minutes.   Yes  [name] 03/14/2020, 1:24 AM

## 2020-03-14 NOTE — ED Notes (Signed)
Pt self extubated. Md wickline aware. Pt placed on non-rebreather. O2 sats maintaining 100%. Pt drowsy but alert to person, place and time.

## 2020-03-14 NOTE — Progress Notes (Signed)
ANTICOAGULATION CONSULT NOTE - Follow Up Consult  Pharmacy Consult for Heparin Indication: pulmonary embolus  No Known Allergies  Patient Measurements: Height: _0  (177.8 cm) Weight: 85.1 kg (187 lb 9.8 oz) IBW/kg (Calculated) : 73 Heparin Dosing Weight: 92.5 kg  Vital Signs: Temp: 99.9 F (37.7 C) (09/02 0800) Temp Source: Bladder (09/02 0500) BP: 137/59 (09/02 0800) Pulse Rate: 76 (09/02 0800)  Labs: Recent Labs    03/13/20 5929 03/13/20 2446 03/13/20 2863 03/13/20 0648 03/13/20 0834 03/13/20 0834 03/13/20 1025 03/13/20 1702 03/13/20 1740 03/13/20 2220 03/14/20 0455  HGB 10.3*   < > 11.2*   < > 10.2*   < >  --   --  7.2*  --  7.7*  HCT 35.1*   < > 33.0*   < > 30.0*  --   --   --  23.3*  --  25.0*  PLT 415*  --   --   --   --   --   --   --   --   --  261  APTT 29  --   --   --   --   --   --   --   --   --   --   LABPROT 13.6  --   --   --   --   --   --   --   --   --   --   INR 1.1  --   --   --   --   --   --   --   --   --   --   HEPARINUNFRC  --   --   --   --   --   --   --  0.38  --  0.40 0.41  CREATININE 1.79*   < > 1.60*  --   --   --   --  1.76*  --   --  1.60*  TROPONINIHS 20*  --   --   --   --   --  42*  --   --   --   --    < > = values in this interval not displayed.    Estimated Creatinine Clearance: 48.2 mL/min (A) (by C-G formula based on SCr of 1.6 mg/dL (H)).   Medical History: History reviewed. No pertinent past medical history.  Assessment: 64 yr old male presented with respiratory distress, multifocal pneumonia and subsegmental PE on CTA.  Pharmacy was consulted to dose heparin. Pt was not on anticoagulation PTA, H/H low, plts elevated (noted hx of multiple myeloma on chemotherapy tx).  Pt had some minor bleeding from mouth on presentation; per ED provider, none present and clear airway on intubation when heparin infusion was started.   Patient's heparin level remains therapeutic on 1450 units/hr of IV heparin. H/H low stable, Plt  wnl. No s/s of bleeding noted   Goal of Therapy:  Heparin level 0.3-0.7 units/ml Monitor platelets by anticoagulation protocol: Yes   Plan:  Continue heparin infusion at 1450 units/hr Monitor daily heparin level, CBC Monitor for signs/symptoms of bleeding  Albertina Parr, PharmD., BCPS, BCCCP Clinical Pharmacist Clinical phone for 03/14/20 until 3:30pm: [phone] If after 3:30pm, please refer to [facility] for unit-specific pharmacist

## 2020-03-14 NOTE — Progress Notes (Addendum)
[place] Progress Note Patient Name: [name] DOB: [birth year] MRN: [id number]   Date of Service  03/14/2020  HPI/Events of Note  Blood glucose = 41. Already got D50 by nursing. Patient on Metformin at home. Metformin OD???  eICU Interventions  Plan: 1. Increase D10W to 200 mL/hour.      Intervention Category Major Interventions: Other:  Lysle Dingwall 03/14/2020, 6:43 AM

## 2020-03-14 NOTE — Progress Notes (Signed)
[place] Progress Note Patient Name: [name] DOB: [birth year] MRN: [id number]   Date of Service  03/14/2020  HPI/Events of Note  Hyperglycemia - Blood glucose = 350 --> 485 --> 490. Currently on Q 4 hour moderate Novolog SSI.    eICU Interventions  Plan: 1. Insulin IV infusion per [employer] protocol.      Intervention Category Major Interventions: Hyperglycemia - active titration of insulin therapy  Lysle Dingwall 03/14/2020, 9:36 PM

## 2020-03-14 NOTE — Progress Notes (Signed)
[place] Progress Note Patient Name: [name] DOB: May 09, 1956 MRN: [id number]   Date of Service  03/14/2020  HPI/Events of Note  Informed by ED physician that the patient self extubated. Sat = 100% and RR = 19.   eICU Interventions  Will ask ground team to evaluate the patient at bedside.      Intervention Category Major Interventions: Other:  [name] 03/14/2020, 1:40 AM

## 2020-03-14 NOTE — Progress Notes (Signed)
[facility] ADULT ICU REPLACEMENT PROTOCOL   The patient does apply for the [facility] Adult ICU Electrolyte Replacment Protocol based on the criteria listed below:   1. Is GFR >/= 30 ml/min? Yes.    Patient's GFR today is 52 2. Is SCr </= 2? Yes.   Patient's SCr is 1.6 ml/kg/hr 3. Did SCr increase >/= 0.5 in 24 hours? No. 4. Abnormal electrolyte(s): k 2.8 5. Ordered repletion with: protocol 6. If a panic level lab has been reported, has the CCM MD in charge been notified? No..   Physician:    Ronda Fairly A 03/14/2020 6:34 AM

## 2020-03-14 NOTE — Progress Notes (Signed)
NEUROLOGY CONSULTATION PROGRESS NOTE   Date of service: March 14, 2020 Patient Name: [name] MRN:  [id number] DOB:  Sep 30, 1955  Interval Hx  He self extubated. Awake, alert. Wife at bedside. CTH with no acute abnormalities.  rEEG neg.   Physical Exam    Neurologic Examination  Mental status/Cognition: Alert, oriented to self, place, year but not month, poor attention. Speech/language: Fluent, comprehension intact. Cranial nerves:   CN II Pupils equal and reactive to light, no VF deficits   CN III,IV,VI EOM intact, no gaze preference or deviation, no nystagmus   CN V normal sensation in V1, V2, and V3 segments bilaterally   CN VII no asymmetry, no nasolabial fold flattening   CN VIII normal hearing to speech   CN IX & X normal palatal elevation, no uvular deviation   CN XI    CN XII midline tongue protrusion   Motor:  Muscle bulk: normal, tone normal  5/5 Grip strength BL with 5/5 BL hip flexion.  Sensation:  Light touch Intact throughout   Pin prick    Temperature    Vibration   Proprioception    Coordination/Complex Motor:  - Finger to Nose with mild ataxia BL  Labs   Lab Results  Component Value Date   NA 139 03/14/2020   K 2.8 (L) 03/14/2020   CL 109 03/14/2020   CO2 22 03/14/2020   GLUCOSE 123 (H) 03/14/2020   BUN 16 03/14/2020   CREATININE 1.60 (H) 03/14/2020   CALCIUM 7.7 (L) 03/14/2020   ALBUMIN 2.7 (L) 03/13/2020   AST 18 03/13/2020   ALT 11 03/13/2020   ALKPHOS 61 03/13/2020   BILITOT 0.6 03/13/2020   GFRNONAA 45 (L) 03/14/2020   GFRAA 52 (L) 03/14/2020     Imaging and Diagnostic studies  CTH w/o C: 1. No acute intracranial abnormality. 2. 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  REEG: This study is suggestive of severe diffuse encephalopathy, nonspecific etiology but could be related to sedation. No seizures or epileptiform discharges were seen throughout the  recording.  Impression   [name] is a 64 y.o. male with 64 year old male with decreased responsiveness in the setting of hypoxia, tachycardia, hypertension. Now awake, alert and following commands with no focal deficit. Suspicion for a primary CNS pathology is low with intact exam, negative CTH no seizures on EEG and an alternative etiology is likely the culprit given elevated lactate at presentation and leukocytosis and PE on CTA chest with lung consolidations.  Recommendations  - Neurology inpatient team will signoff. Please feel free to contact us with any questions or concerns. ______________________________________________________________________   Thank you for the opportunity to take part in the care of this patient. If you have any further questions, please contact the neurology consultation attending.  Signed,  [place] Pager Number [phone]

## 2020-03-14 NOTE — Progress Notes (Addendum)
NAME:  [name], MRN:  [id number], DOB:  Jul 28, 1955, LOS: 1 ADMISSION DATE:  03/13/2020, CONSULTATION DATE:  03/14/20 REFERRING MD:  ED, CHIEF COMPLAINT:  Respiratory distress (per wife)   Brief History   63 year old with HTN, DM, MM on daratumumab who presents to ED after wife found him gurgling in bed subsequently intubated in ED for respiratory distress admitted to the ICU for ongoing care  History of present illness   Patient intubated and sedated. History per EMR and wife at bedside. Wife was having hot flash so slept in other bedroom. She came ion bedroom in the morning and found him gurgling. Room smelled foul. Brownish liquid noted on sheets. Blood was around his mouth. EMS was called. Upon arrival to ED he was hypoxemic and altered. He was intubated. CTA demonstrated dense bilateral lower lobe consolidations and scattered nodular central GGOs in RUL as well as small subsegmental PE noted on an addendum. Hgb 10 above recent values in 8s. No blood seen when intubating and none in inline suction. Leukocytosis to 15k. Lactic acid 5.9 improved to 1.7 after IVFs.   He was given vanc/zosyn. Persistently hypoglycemic so D10 drip started. Oxygen weaning on vent. Bps low normal. Abdominal film for OG tube demonstrated bilateral hydro. Foley placed with ~1L immediately removed.   Past Medical History  CAD, DM, MM  Significant Hospital Events   Admitted intubated 9/1, self extubated  Consults:  PCCM  Procedures:  EET 9/1  Significant Diagnostic Tests:  CTA chest 9/1 bilateral lower lobe consolidations, subsegmental PE  Micro Data:  Pending  Antimicrobials:  vanc 9/1 >>  Zozyn 9/1 >>   Interim history/subjective:  Ongoing hypoglycemia overnight. D10 infusion increased.   Objective   Blood pressure (!) 131/59, pulse 73, temperature 99.7 F (37.6 C), resp. rate 16, height $RemoveBe'5\' 10"'saxWAlRJM$  (1.778 m), weight 85.1 kg, SpO2 100 %.    Vent Mode: PRVC FiO2 (%):  [30 %-40 %] 30 % Set  Rate:  [20 bmp] 20 bmp Vt Set:  [580 mL] 580 mL PEEP:  [5 cmH20] 5 cmH20 Plateau Pressure:  [10 cmH20-19 cmH20] 18 cmH20   Intake/Output Summary (Last 24 hours) at 03/14/2020 8588 Last data filed at 03/14/2020 0700 Gross per 24 hour  Intake 4172.3 ml  Output 3200 ml  Net 972.3 ml   Filed Weights   03/13/20 0641 03/14/20 0500  Weight: 95.3 kg 85.1 kg    Examination:  General: adult male in NAD HENT: [place]/AT, PERRL, no JVD Lungs: clear, bilateral breath sounds Cardiovascular: RRR, no murmur Abdomen: Soft, non-tender Extremities: no edema Neuro: Alert, oriented, non-focal  Resolved Hospital Problem list   n/a  Assessment & Plan:  Acute hypoxemic respiratory failure due to aspiration pneumonia: Found in bed, liquid substance in bed beside him. Bilateral dense lower lobe consolidations.Self extubated in ED --Lower respiratory culture --Zosyn --Supplemental O2 to keep SpO2 > 90%  Severe sepsis: Leukocytosis, tachypnea, tachycardia with hypoxemia.  Due to aspiration pneumonia. --Antibiotics as above --Status post 30 cc/kg with improved lactate  Subsegmental PE: felt to be provoked in the setting of MM  --Heparin infusion per pharmacy with close monitoring for any signs of GI bleeding.  --Trend hemoglobins - dopplers - echo  Hypoglycemia: History of DM on glipizide and metformin. Possible inadvertent sulfonylurea toxicity. He was just started on glipizide one day prior to admission. - D10 infusion - Give steroids to offer insulin resistance  - Octreotide 22mcg q 6 hours subcutaneous  Anemia: Baseline anemia  due to multiple myeloma.  Hemoglobin above baseline on admission, suspect due to hypovolemia.  Repeat down to 7 from baseline eights.  No active sign of bleeding.  Dried blood on mouth but no sign of blood in stomach via OG tube. --Trend hemoglobin, continue heparin for now  AKI: Multifactorial, hypovolemic.  Also component of urinary retention given bilateral hydronephrosis  seen on abdominal film and 1 L of urine removed with Foley placed. - Trend creatinine, avoid nephrotoxic agents - Replace K - BMP every 4 hours  Hypertension: On 4 agents at baseline.  Blood pressure soft on arrival. --Hold home antihypertensives.  Multiple myeloma: with anemia. --Trend CBC  Best practice:  Diet: regular will need carb mod once sugars have improved.  Pain/Anxiety/Delirium protocol (if indicated):  VAP protocol (if indicated):  DVT prophylaxis: hep gtt GI prophylaxis: pepcid Glucose control: d10 Mobility: bed rest Code Status: Full Family Communication: at bedside Disposition: ICU  Labs   CBC: Recent Labs  Lab 03/13/20 0642 03/13/20 0648 03/13/20 0834 03/13/20 1740 03/14/20 0455  WBC 15.3*  --   --   --  8.2  NEUTROABS 12.0*  --   --   --  7.3  HGB 10.3* 11.2* 10.2* 7.2* 7.7*  HCT 35.1* 33.0* 30.0* 23.3* 25.0*  MCV 84.4  --   --   --  80.6  PLT 415*  --   --   --  950    Basic Metabolic Panel: Recent Labs  Lab 03/13/20 0642 03/13/20 0648 03/13/20 0834 03/13/20 1702 03/14/20 0455  NA 140 141 140 141 139  K 4.7 4.5 3.2* 3.1* 2.8*  CL 103 106  --  109 109  CO2 20*  --   --  21* 22  GLUCOSE 50* 50*  --  97 123*  BUN 18 23  --  18 16  CREATININE 1.79* 1.60*  --  1.76* 1.60*  CALCIUM 8.7*  --   --  7.4* 7.7*  MG  --   --   --   --  1.6*  PHOS  --   --   --   --  2.9   GFR: Estimated Creatinine Clearance: 48.2 mL/min (A) (by C-G formula based on SCr of 1.6 mg/dL (H)). Recent Labs  Lab 03/13/20 0642 03/13/20 1103 03/13/20 1702 03/14/20 0455  WBC 15.3*  --   --  8.2  LATICACIDVEN 5.9* 1.7 2.3*  --     Liver Function Tests: Recent Labs  Lab 03/13/20 0642  AST 18  ALT 11  ALKPHOS 61  BILITOT 0.6  PROT 7.0  ALBUMIN 2.7*   No results for input(s): LIPASE, AMYLASE in the last 168 hours. Recent Labs  Lab 03/13/20 1025  AMMONIA 23    ABG    Component Value Date/Time   PHART 7.590 (H) 03/13/2020 0834   PCO2ART 28.9 (L)  03/13/2020 0834   PO2ART 332 (H) 03/13/2020 0834   HCO3 27.7 03/13/2020 0834   TCO2 29 03/13/2020 0834   O2SAT 100.0 03/13/2020 0834     Coagulation Profile: Recent Labs  Lab 03/13/20 0642  INR 1.1    Cardiac Enzymes: No results for input(s): CKTOTAL, CKMB, CKMBINDEX, TROPONINI in the last 168 hours.  HbA1C: No results found for: HGBA1C  CBG: Recent Labs  Lab 03/14/20 0357 03/14/20 0445 03/14/20 0504 03/14/20 0615 03/14/20 0718  GLUCAP 64* 49* 84 41* 81    Review of Systems:   Unable to obtain due to patient factors  Past Medical History  Unable to obtain due to patient factors   Surgical History   Unable to obtain due to patient factors   Social History    Unable to obtain due to patient factors   Family History   Unable to obtain due to patient factors   Allergies Unable to obtain due to patient factors   Home Medications  Prior to Admission medications   Medication Sig Start Date End Date Taking? Authorizing Provider  acyclovir (ZOVIRAX) 400 MG tablet Take 400 mg by mouth in the morning and at bedtime. 11/07/19  Yes [provider]  amLODipine (NORVASC) 5 MG tablet Take 5 mg by mouth daily. 01/09/20  Yes [provider]  aspirin EC 81 MG tablet Take 81 mg by mouth daily. Swallow whole.   Yes [provider]  atorvastatin (LIPITOR) 80 MG tablet Take 80 mg by mouth daily.   Yes [provider]  carvedilol (COREG) 25 MG tablet Take 25 mg by mouth 2 (two) times daily with a meal.   Yes [provider]  dexamethasone (DECADRON) 4 MG tablet Take 12 mg by mouth See admin instructions. Take after chemo treatments   Yes [provider]  hydrALAZINE (APRESOLINE) 50 MG tablet Take 50 mg by mouth 3 (three) times daily. 01/31/20  Yes [provider]  isosorbide mononitrate (IMDUR) 30 MG 24 hr tablet Take 30 mg by mouth daily. 01/10/20  Yes [provider]  losartan (COZAAR) 100 MG tablet Take  100 mg by mouth daily. 01/10/20  Yes [provider]  metFORMIN (GLUCOPHAGE) 500 MG tablet Take 1,000 mg by mouth daily with breakfast.   Yes [provider]  ondansetron (ZOFRAN) 8 MG tablet Take 8 mg by mouth every 8 (eight) hours as needed for nausea or vomiting.   Yes [provider]  predniSONE (DELTASONE) 20 MG tablet Take 20 mg by mouth daily as needed (arthiritis).   Yes [provider]  prochlorperazine (COMPAZINE) 10 MG tablet Take 10 mg by mouth every 6 (six) hours as needed for nausea or vomiting.   Yes [provider]      Georgann Housekeeper, AGACNP-BC [place]  See Amion for personal pager PCCM on call pager [phone]  03/14/2020 9:24 AM

## 2020-03-14 NOTE — Evaluation (Signed)
Clinical/Bedside Swallow Evaluation Patient Details  Name: [name] MRN: [id number] Date of Birth: 1955/08/16  Today's Date: 03/14/2020 Time: SLP Start Time (ACUTE ONLY): 0841 SLP Stop Time (ACUTE ONLY): 3151 SLP Time Calculation (min) (ACUTE ONLY): 16 min  Past Medical History: History reviewed. No pertinent past medical history. Past Surgical History: History reviewed. No pertinent surgical history. HPI:  Pt is a 64 yo male presenting with respiratory distress and decreased responsiveness. There was evidence upon EMS arrival, with concern for aspiration and bilateral lower lobe consolidations suggestive of PNA. CTA also showed PE. CT Head negative. He was intubated in the ED 9/1; self-extubated early 9/2. PMH includes: CAD, HTN, PAD, CHF, DMII, multiple myeloma   Assessment / Plan / Recommendation Clinical Impression  Pt self-extubated overnight with voice sounding soft, but he is capable of achieving louder phonation, with clear quality, when prompted to do so. He has no overt s/s of aspiration, consuming larger amounts of thin liquids including most of a cup of water as well as 4 oz of juice almost consecutively. Pt has no subjective complaints or reports of baseline dysphagia. CXR concerning for PNA but there was also evidence of emesis PTA. Recommend starting regular solids and thin liquids with brief SLP f/u for tolerance.    SLP Visit Diagnosis: Dysphagia, unspecified (R13.10)    Aspiration Risk  Mild aspiration risk    Diet Recommendation Regular;Thin liquid   Liquid Administration via: Cup;Straw Medication Administration: Whole meds with liquid Supervision: Patient able to self feed;Intermittent supervision to cue for compensatory strategies Compensations: Slow rate;Small sips/bites Postural Changes: Seated upright at 90 degrees    Other  Recommendations Oral Care Recommendations: Oral care BID   Follow up Recommendations  (tba)      Frequency and Duration min 1  x/week  1 week       Prognosis Prognosis for Safe Diet Advancement: Good      Swallow Study   General HPI: Pt is a 64 yo male presenting with respiratory distress and decreased responsiveness. There was evidence upon EMS arrival, with concern for aspiration and bilateral lower lobe consolidations suggestive of PNA. CTA also showed PE. CT Head negative. He was intubated in the ED 9/1; self-extubated early 9/2. PMH includes: CAD, HTN, PAD, CHF, DMII, multiple myeloma Type of Study: Bedside Swallow Evaluation Diet Prior to this Study: NPO Temperature Spikes Noted: Yes (101.4) Respiratory Status: Nasal cannula History of Recent Intubation: Yes Length of Intubations (days): 1 days Date extubated: 03/14/20 Behavior/Cognition: Alert;Cooperative;Requires cueing Oral Cavity Assessment: Within Functional Limits Oral Care Completed by SLP: No Oral Cavity - Dentition: Missing dentition;Poor condition Vision: Functional for self-feeding Self-Feeding Abilities: Able to feed self Patient Positioning: Upright in bed Baseline Vocal Quality: Normal;Low vocal intensity Volitional Cough: Weak Volitional Swallow: Able to elicit    Oral/Motor/Sensory Function Overall Oral Motor/Sensory Function: Within functional limits   Ice Chips Ice chips: Not tested   Thin Liquid Thin Liquid: Within functional limits Presentation: Cup;Self Fed;Straw    Nectar Thick Nectar Thick Liquid: Not tested   Honey Thick Honey Thick Liquid: Not tested   Puree Puree: Within functional limits Presentation: Self Fed;Spoon   Solid     Solid: Within functional limits Presentation: Self Fed      Osie Bond., M.A. [facility] Pager [phone] Office [phone]  03/14/2020,9:04 AM

## 2020-03-15 DIAGNOSIS — N179 Acute kidney failure, unspecified: Secondary | ICD-10-CM | POA: Diagnosis not present

## 2020-03-15 DIAGNOSIS — J690 Pneumonitis due to inhalation of food and vomit: Secondary | ICD-10-CM | POA: Diagnosis not present

## 2020-03-15 DIAGNOSIS — J9601 Acute respiratory failure with hypoxia: Secondary | ICD-10-CM | POA: Diagnosis not present

## 2020-03-15 DIAGNOSIS — C9000 Multiple myeloma not having achieved remission: Secondary | ICD-10-CM | POA: Diagnosis not present

## 2020-03-15 LAB — CBC WITH DIFFERENTIAL/PLATELET
Abs Immature Granulocytes: 0.12 10*3/uL — ABNORMAL HIGH (ref 0.00–0.07)
Basophils Absolute: 0 10*3/uL (ref 0.0–0.1)
Basophils Relative: 0 %
Eosinophils Absolute: 0 10*3/uL (ref 0.0–0.5)
Eosinophils Relative: 0 %
HCT: 25.9 % — ABNORMAL LOW (ref 39.0–52.0)
Hemoglobin: 7.6 g/dL — ABNORMAL LOW (ref 13.0–17.0)
Immature Granulocytes: 0 %
Lymphocytes Relative: 6 %
Lymphs Abs: 0.6 10*3/uL — ABNORMAL LOW (ref 0.7–4.0)
MCH: 24.1 pg — ABNORMAL LOW (ref 26.0–34.0)
MCHC: 29.3 g/dL — ABNORMAL LOW (ref 30.0–36.0)
MCV: 82 fL (ref 80.0–100.0)
Monocytes Absolute: 0.5 10*3/uL (ref 0.1–1.0)
Monocytes Relative: 5 %
Neutro Abs: 10 10*3/uL — ABNORMAL HIGH (ref 1.7–7.7)
Neutrophils Relative %: 88 %
Platelets: 292 10*3/uL (ref 150–400)
RBC: 3.16 MIL/uL — ABNORMAL LOW (ref 4.22–5.81)
RDW: 19.2 % — ABNORMAL HIGH (ref 11.5–15.5)
WBC: 11.2 10*3/uL — ABNORMAL HIGH (ref 4.0–10.5)
nRBC: 0 % (ref 0.0–0.2)

## 2020-03-15 LAB — HEPARIN LEVEL (UNFRACTIONATED): Heparin Unfractionated: 0.67 IU/mL (ref 0.30–0.70)

## 2020-03-15 LAB — BASIC METABOLIC PANEL
Anion gap: 12 (ref 5–15)
Anion gap: 11 (ref 5–15)
BUN: 24 mg/dL — ABNORMAL HIGH (ref 8–23)
BUN: 25 mg/dL — ABNORMAL HIGH (ref 8–23)
CO2: 19 mmol/L — ABNORMAL LOW (ref 22–32)
CO2: 21 mmol/L — ABNORMAL LOW (ref 22–32)
Calcium: 7.9 mg/dL — ABNORMAL LOW (ref 8.9–10.3)
Calcium: 8.2 mg/dL — ABNORMAL LOW (ref 8.9–10.3)
Chloride: 109 mmol/L (ref 98–111)
Chloride: 111 mmol/L (ref 98–111)
Creatinine, Ser: 2.15 mg/dL — ABNORMAL HIGH (ref 0.61–1.24)
Creatinine, Ser: 1.98 mg/dL — ABNORMAL HIGH (ref 0.61–1.24)
GFR calc Af Amer: 36 mL/min — ABNORMAL LOW (ref >60)
GFR calc Af Amer: 40 mL/min — ABNORMAL LOW (ref >60)
GFR calc non Af Amer: 31 mL/min — ABNORMAL LOW (ref >60)
GFR calc non Af Amer: 35 mL/min — ABNORMAL LOW (ref >60)
Glucose, Bld: 499 mg/dL — ABNORMAL HIGH (ref 70–99)
Glucose, Bld: 188 mg/dL — ABNORMAL HIGH (ref 70–99)
Potassium: 3.8 mmol/L (ref 3.5–5.1)
Potassium: 3.4 mmol/L — ABNORMAL LOW (ref 3.5–5.1)
Sodium: 140 mmol/L (ref 135–145)
Sodium: 143 mmol/L (ref 135–145)

## 2020-03-15 LAB — MAGNESIUM: Magnesium: 2.3 mg/dL (ref 1.7–2.4)

## 2020-03-15 LAB — GLUCOSE, CAPILLARY
Glucose-Capillary: 108 mg/dL — ABNORMAL HIGH (ref 70–99)
Glucose-Capillary: 112 mg/dL — ABNORMAL HIGH (ref 70–99)
Glucose-Capillary: 117 mg/dL — ABNORMAL HIGH (ref 70–99)
Glucose-Capillary: 122 mg/dL — ABNORMAL HIGH (ref 70–99)
Glucose-Capillary: 127 mg/dL — ABNORMAL HIGH (ref 70–99)
Glucose-Capillary: 169 mg/dL — ABNORMAL HIGH (ref 70–99)
Glucose-Capillary: 274 mg/dL — ABNORMAL HIGH (ref 70–99)
Glucose-Capillary: 301 mg/dL — ABNORMAL HIGH (ref 70–99)
Glucose-Capillary: 357 mg/dL — ABNORMAL HIGH (ref 70–99)
Glucose-Capillary: 387 mg/dL — ABNORMAL HIGH (ref 70–99)
Glucose-Capillary: 400 mg/dL — ABNORMAL HIGH (ref 70–99)
Glucose-Capillary: 400 mg/dL — ABNORMAL HIGH (ref 70–99)
Glucose-Capillary: 434 mg/dL — ABNORMAL HIGH (ref 70–99)
Glucose-Capillary: 77 mg/dL (ref 70–99)
Glucose-Capillary: 99 mg/dL (ref 70–99)

## 2020-03-15 LAB — PHOSPHORUS: Phosphorus: 2.9 mg/dL (ref 2.5–4.6)

## 2020-03-15 MED ORDER — AMOXICILLIN-POT CLAVULANATE 875-125 MG PO TABS
1.0000 | ORAL_TABLET | Freq: Two times a day (BID) | ORAL | Status: DC
  Administered 2020-03-15 – 2020-03-18 (×6): 1 via ORAL
  Filled 2020-03-15 (×7): qty 1

## 2020-03-15 MED ORDER — APIXABAN 5 MG PO TABS: 5.0000 mg | ORAL_TABLET | Freq: Two times a day (BID) | ORAL | Status: DC

## 2020-03-15 MED ORDER — AMLODIPINE BESYLATE 5 MG PO TABS
5.0000 mg | ORAL_TABLET | Freq: Every day | ORAL | Status: DC
  Administered 2020-03-15 – 2020-03-18 (×4): 5 mg via ORAL
  Filled 2020-03-15 (×4): qty 1

## 2020-03-15 MED ORDER — POTASSIUM CHLORIDE CRYS ER 20 MEQ PO TBCR
40.0000 meq | EXTENDED_RELEASE_TABLET | Freq: Once | ORAL | Status: AC
  Administered 2020-03-15: 40 meq via ORAL
  Filled 2020-03-15: qty 2

## 2020-03-15 MED ORDER — APIXABAN 5 MG PO TABS
10.0000 mg | ORAL_TABLET | Freq: Two times a day (BID) | ORAL | Status: DC
  Administered 2020-03-15 – 2020-03-18 (×7): 10 mg via ORAL
  Filled 2020-03-15 (×7): qty 2

## 2020-03-15 MED ORDER — CARVEDILOL 25 MG PO TABS
25.0000 mg | ORAL_TABLET | Freq: Two times a day (BID) | ORAL | Status: DC
  Administered 2020-03-15 – 2020-03-18 (×5): 25 mg via ORAL
  Filled 2020-03-15: qty 2
  Filled 2020-03-15 (×4): qty 1

## 2020-03-15 MED ORDER — INSULIN ASPART 100 UNIT/ML ~~LOC~~ SOLN
0.0000 [IU] | SUBCUTANEOUS | Status: DC
  Administered 2020-03-15: 4 [IU] via SUBCUTANEOUS
  Administered 2020-03-15: 5 [IU] via SUBCUTANEOUS
  Administered 2020-03-16: 3 [IU] via SUBCUTANEOUS
  Administered 2020-03-16: 2 [IU] via SUBCUTANEOUS
  Administered 2020-03-16: 4 [IU] via SUBCUTANEOUS
  Administered 2020-03-16: 1 [IU] via SUBCUTANEOUS
  Administered 2020-03-16: 5 [IU] via SUBCUTANEOUS
  Administered 2020-03-17 (×3): 1 [IU] via SUBCUTANEOUS

## 2020-03-15 NOTE — Telephone Encounter (Signed)
Since he had a low blood sugar episode again would like to see him today at 10:15 AM if possible.  If not need to know exactly how he has been taking his diabetes medications last 3 days

## 2020-03-15 NOTE — Progress Notes (Signed)
NAME:  [name], MRN:  [id number], DOB:  17-Jun-1956, LOS: 2 ADMISSION DATE:  03/13/2020, CONSULTATION DATE:  03/15/20 REFERRING MD:  ED, CHIEF COMPLAINT:  Respiratory distress (per wife)   Brief History   64 year old with HTN, DM, MM on daratumumab who presents to ED after wife found him gurgling in bed subsequently intubated in ED for respiratory distress admitted to the ICU for ongoing care  History of present illness   Patient intubated and sedated. History per EMR and wife at bedside. Wife was having hot flash so slept in other bedroom. She came ion bedroom in the morning and found him gurgling. Room smelled foul. Brownish liquid noted on sheets. Blood was around his mouth. EMS was called. Upon arrival to ED he was hypoxemic and altered. He was intubated. CTA demonstrated dense bilateral lower lobe consolidations and scattered nodular central GGOs in RUL as well as small subsegmental PE noted on an addendum. Hgb 10 above recent values in 8s. No blood seen when intubating and none in inline suction. Leukocytosis to 15k. Lactic acid 5.9 improved to 1.7 after IVFs.   He was given vanc/zosyn. Persistently hypoglycemic so D10 drip started. Oxygen weaning on vent. Bps low normal. Abdominal film for OG tube demonstrated bilateral hydro. Foley placed with ~1L immediately removed.   Past Medical History  CAD, DM, MM  Significant Hospital Events   Admitted intubated 9/1, self extubated  Consults:  PCCM  Procedures:  EET 9/1  Significant Diagnostic Tests:  CTA chest 9/1 bilateral lower lobe consolidations, subsegmental PE  Micro Data:  Blood 9/1 > Urine 9/1 > Trach aspirate 9/1 >  Antimicrobials:  vanc 9/1 >>  Zozyn 9/1 >>   Interim history/subjective:  Became hyperglycemic overnight requiring insulin infusion, now blood sugars have returned to normal.   Objective   Blood pressure (!) 143/73, pulse 65, temperature (!) 96.8 F (36 C), resp. rate 12, height $RemoveBe'5\' 10"'uSlifYJpB$  (1.778 m),  weight 85.1 kg, SpO2 100 %.        Intake/Output Summary (Last 24 hours) at 03/15/2020 1151 Last data filed at 03/15/2020 1000 Gross per 24 hour  Intake 1930.6 ml  Output 4175 ml  Net -2244.4 ml   Filed Weights   03/13/20 0641 03/14/20 0500  Weight: 95.3 kg 85.1 kg    Examination:  General: adult male in NAD HENT: [place]/AT, PERRL, no JVD Lungs: clear bilateral breath sounds, no distress.  Cardiovascular: RRR, no MRG Abdomen: Soft, non-tender, nondistended Extremities: no edema Neuro: alert oriented non-focal. One word answers. Family says this is normal.   Resolved Hospital Problem list   n/a  Assessment & Plan:  Acute hypoxemic respiratory failure due to aspiration pneumonia: Improved.  - Cultures NGTD - Zosyn narrow to augmentin for 5 day total ABX course  Severe sepsis: Leukocytosis, tachypnea, tachycardia with hypoxemia.  Due to aspiration pneumonia. Resolved --Antibiotics as above  Subsegmental PE: felt to be provoked in the setting of MM  - Heparin infusion per pharmacy, transition to eliquis. Will have pharmacy dose doe to CKD -Trend hemoglobins - dopplers - echo  Hypoglycemia: History of DM on glipizide and metformin. Seems like this is secondary to glipizide initiation and sulfonylurea tox. He was just started on glipizide one day prior to admission. Hypoglycemia was likely the inciting event causing aspiration and altered sensorium.  - Octreotide x 24 hours to end today - DC steroids - DW diabetes coordinator. Plan to DC insulin infusion and start CBC q 4 hours with  very sensitive correction SSI.   Anemia: Baseline anemia due to multiple myeloma.  Hemoglobin above baseline on admission, suspect due to hypovolemia.  Repeat down to 7 from baseline 8.  No active sign of bleeding.  Dried blood on mouth but no sign of blood in stomach via OG tube. - Trend hemoglobin, continue AC  AKI on CKD: Multifactorial, hypovolemic.  Also component of urinary retention given  bilateral hydronephrosis seen on abdominal film and 1 L of urine removed with Foley placed. Serum creatinine slowly returning to baseline of ~ 1.6.  - Trend creatinine, avoid nephrotoxic agents - Replace K - Follow BMP  Hypertension: On 4 agents at baseline.  Blood pressure soft on arrival. - Will restart home amlodipine, coreg - Continue to hold home losartan, imdur  Multiple myeloma: with anemia. --Trend CBC  Transfer patient to med-surg bed.   Best practice:  Diet: transition to carb modified diet. Heart healthy Pain/Anxiety/Delirium protocol (if indicated): NA VAP protocol (if indicated):  DVT prophylaxis: hep gtt GI prophylaxis: pepcid Glucose control: SSI Mobility: OOB with assist Code Status: Full Family Communication: patient and family updated at bedside.  Disposition: transfer to med surg  Labs   CBC: Recent Labs  Lab 03/13/20 0642 03/13/20 0648 03/13/20 0834 03/13/20 1740 03/14/20 0455 03/14/20 1838 03/15/20 0507  WBC 15.3*  --   --   --  8.2  --  11.2*  NEUTROABS 12.0*  --   --   --  7.3  --  10.0*  HGB 10.3*   < > 10.2* 7.2* 7.7* 8.4* 7.6*  HCT 35.1*   < > 30.0* 23.3* 25.0* 28.3* 25.9*  MCV 84.4  --   --   --  80.6  --  82.0  PLT 415*  --   --   --  261  --  292   < > = values in this interval not displayed.    Basic Metabolic Panel: Recent Labs  Lab 03/14/20 0455 03/14/20 1232 03/14/20 1838 03/15/20 0045 03/15/20 0507  NA 139 139 135 140 143  K 2.8* 3.9 4.6 3.8 3.4*  CL 109 110 105 109 111  CO2 22 21* 19* 19* 21*  GLUCOSE 123* 191* 498* 499* 188*  BUN $Re'16 13 17 'Can$ 24* 25*  CREATININE 1.60* 1.70* 1.90* 2.15* 1.98*  CALCIUM 7.7* 7.8* 7.9* 7.9* 8.2*  MG 1.6*  --   --   --  2.3  PHOS 2.9  --   --   --  2.9   GFR: Estimated Creatinine Clearance: 38.9 mL/min (A) (by C-G formula based on SCr of 1.98 mg/dL (H)). Recent Labs  Lab 03/13/20 0642 03/13/20 1103 03/13/20 1702 03/14/20 0455 03/15/20 0507  WBC 15.3*  --   --  8.2 11.2*    LATICACIDVEN 5.9* 1.7 2.3*  --   --     Liver Function Tests: Recent Labs  Lab 03/13/20 0642  AST 18  ALT 11  ALKPHOS 61  BILITOT 0.6  PROT 7.0  ALBUMIN 2.7*   No results for input(s): LIPASE, AMYLASE in the last 168 hours. Recent Labs  Lab 03/13/20 1025  AMMONIA 23    ABG    Component Value Date/Time   PHART 7.590 (H) 03/13/2020 0834   PCO2ART 28.9 (L) 03/13/2020 0834   PO2ART 332 (H) 03/13/2020 0834   HCO3 27.7 03/13/2020 0834   TCO2 29 03/13/2020 0834   O2SAT 100.0 03/13/2020 0834     Coagulation Profile: Recent Labs  Lab 03/13/20 1027  INR 1.1    Cardiac Enzymes: No results for input(s): CKTOTAL, CKMB, CKMBINDEX, TROPONINI in the last 168 hours.  HbA1C: Hgb A1c MFr Bld  Date/Time Value Ref Range Status  03/14/2020 06:55 PM 8.1 (H) 4.8 - 5.6 % Final    Comment:    (NOTE) Pre diabetes:          5.7%-6.4%  Diabetes:              >6.4%  Glycemic control for   <7.0% adults with diabetes     CBG: Recent Labs  Lab 03/15/20 0607 03/15/20 0807 03/15/20 0841 03/15/20 0937 03/15/20 1029  GLUCAP 127* 77 99 117* 122*      Georgann Housekeeper, AGACNP-BC [place]  See Amion for personal pager PCCM on call pager [phone]  03/15/2020 11:51 AM

## 2020-03-15 NOTE — Progress Notes (Signed)
Inpatient Diabetes Program Recommendations  AACE/ADA: New Consensus Statement on Inpatient Glycemic Control (2015)  Target Ranges:  Prepandial:   less than 140 mg/dL      Peak postprandial:   less than 180 mg/dL (1-2 hours)      Critically ill patients:  140 - 180 mg/dL   Lab Results  Component Value Date   GLUCAP 99 03/15/2020   HGBA1C 8.1 (H) 03/14/2020    Review of Glycemic Control Results for [name], [name] (MRN [id number]) as of 03/15/2020 10:21  Ref. Range 03/15/2020 03:36 03/15/2020 05:06 03/15/2020 06:07 03/15/2020 08:07 03/15/2020 08:41  Glucose-Capillary Latest Ref Range: 70 - 99 mg/dL 274 (H) 169 (H) 127 (H) 77 99   Diabetes history: DM 2 Outpatient Diabetes medications:  Metformin 1000 mg daily with breakfast- patient had recently been started on Glipizide?? Current orders for Inpatient glycemic control:  IV insulin  Inpatient Diabetes Program Recommendations:         Note that patient admitted with hypoglycemia.  Discussed with wife who states that EMS had come on Tuesday evening due to having a blood sugar of 30 mg/dL.  She states that she did not know what to do.  She is unsure of how long patient had been taking Glyburide? She states that when she called EMS back patient had blood coming out of mouth and that blood sugar was 11 mg/dL.  Unsure of why patient is having low blood sugars as patient's wife states this has not been a problem in the past.  We reviewed signs and symptoms of low blood sugars and proper treatment using 4 oz of juice or regular soda.         Patient is currently on insulin drip.  Recommend that transition is conservative due to hypoglycemia.  Consider "very sensitive correction" 0-6 units q 4 hours without basal insulin for transition at this time.  Discussed with RN.   Will follow.   Thanks  Adah Perl, RN, BC-ADM Inpatient Diabetes Coordinator Pager [phone] (8a-5p)

## 2020-03-15 NOTE — Progress Notes (Signed)
ANTICOAGULATION CONSULT NOTE - Follow Up Consult  Pharmacy Consult for Heparin > apixaban  Indication: pulmonary embolus  No Known Allergies  Patient Measurements: Height: 5\' 10"  (177.8 cm) Weight: 85.1 kg (187 lb 9.8 oz) IBW/kg (Calculated) : 73 Heparin Dosing Weight: 92.5 kg  Vital Signs: Temp: 96.6 F (35.9 C) (09/03 0800) BP: 161/102 (09/03 0800) Pulse Rate: 73 (09/03 0800)  Labs: Recent Labs    03/13/20 0642 03/13/20 0648 03/13/20 1025 03/13/20 1702 03/13/20 1740 03/13/20 2220 03/14/20 0455 03/14/20 1232 03/14/20 1838 03/15/20 0045 03/15/20 0507  HGB 10.3*   < >  --    < >   < >  --  7.7*  --  8.4*  --  7.6*  HCT 35.1*   < >  --    < >   < >  --  25.0*  --  28.3*  --  25.9*  PLT 415*  --   --   --   --   --  261  --   --   --  292  APTT 29  --   --   --   --   --   --   --   --   --   --   LABPROT 13.6  --   --   --   --   --   --   --   --   --   --   INR 1.1  --   --   --   --   --   --   --   --   --   --   HEPARINUNFRC  --   --   --    < >  --  0.40 0.41  --   --   --  0.67  CREATININE 1.79*   < >  --    < >   < >  --  1.60*   < > 1.90* 2.15* 1.98*  TROPONINIHS 20*  --  42*  --   --   --   --   --   --   --   --    < > = values in this interval not displayed.    Estimated Creatinine Clearance: 38.9 mL/min (A) (by C-G formula based on SCr of 1.98 mg/dL (H)).   Medical History: History reviewed. No pertinent past medical history.  Assessment: 64 yr old male presented with respiratory distress, multifocal pneumonia and subsegmental PE on CTA. Patient is currently therapeutic on IV heparin and now being transitioned to apixaban.   H/H down. Plt wnl. Scr elevated at 1.98   Goal of Therapy:  Heparin level 0.3-0.7 units/ml Monitor platelets by anticoagulation protocol: Yes   Plan:  Stop IV heparin. Start apixaban 10 mg twice daily x 7 days followed by apixaban 5 mg twice daily  Monitor renal fx  Monitor for signs/symptoms of bleeding  Albertina Parr, PharmD., BCPS, BCCCP Clinical Pharmacist Clinical phone for 03/15/20 until 3:30pm: [phone] If after 3:30pm, please refer to [facility] for unit-specific pharmacist

## 2020-03-15 NOTE — Progress Notes (Signed)
  Speech Language Pathology Treatment: Dysphagia  Patient Details Name: [name] MRN: [id number] DOB: 1955-10-08 Today's Date: 03/15/2020 Time: 8592-7639 SLP Time Calculation (min) (ACUTE ONLY): 10 min  Assessment / Plan / Recommendation Clinical Impression  Pt was seen with regular solids and thin liquids with no overt s/s of aspiration. He has trace oral residuals from dry crackers, but he is very aware of this and uses both a lingual sweep and liquid wash with Mod I until his oral cavity is completely cleared. He and his wife deny any difficulties with meals. SLP to sign off acutely.    HPI HPI: Pt is a 64 yo male presenting with respiratory distress and decreased responsiveness. There was evidence upon EMS arrival, with concern for aspiration and bilateral lower lobe consolidations suggestive of PNA. CTA also showed PE. CT Head negative. He was intubated in the ED 9/1; self-extubated early 9/2. PMH includes: CAD, HTN, PAD, CHF, DMII, multiple myeloma      SLP Plan  All goals met       Recommendations  Diet recommendations: Regular;Thin liquid Liquids provided via: Cup;Straw Medication Administration: Whole meds with liquid Supervision: Patient able to self feed;Intermittent supervision to cue for compensatory strategies Compensations: Slow rate;Small sips/bites Postural Changes and/or Swallow Maneuvers: Seated upright 90 degrees                Oral Care Recommendations: Oral care BID Follow up Recommendations: None SLP Visit Diagnosis: Dysphagia, unspecified (R13.10) Plan: All goals met       GO                Osie Bond., M.A. [place] [facility] Pager [phone] Office [phone]  03/15/2020, 1:54 PM

## 2020-03-16 DIAGNOSIS — J9601 Acute respiratory failure with hypoxia: Secondary | ICD-10-CM | POA: Diagnosis not present

## 2020-03-16 DIAGNOSIS — I10 Essential (primary) hypertension: Secondary | ICD-10-CM

## 2020-03-16 DIAGNOSIS — N179 Acute kidney failure, unspecified: Secondary | ICD-10-CM | POA: Diagnosis not present

## 2020-03-16 DIAGNOSIS — C9000 Multiple myeloma not having achieved remission: Secondary | ICD-10-CM

## 2020-03-16 DIAGNOSIS — A419 Sepsis, unspecified organism: Secondary | ICD-10-CM

## 2020-03-16 DIAGNOSIS — I2693 Single subsegmental pulmonary embolism without acute cor pulmonale: Secondary | ICD-10-CM

## 2020-03-16 DIAGNOSIS — J690 Pneumonitis due to inhalation of food and vomit: Secondary | ICD-10-CM | POA: Diagnosis not present

## 2020-03-16 DIAGNOSIS — R6520 Severe sepsis without septic shock: Secondary | ICD-10-CM

## 2020-03-16 LAB — CBC WITH DIFFERENTIAL/PLATELET
Abs Immature Granulocytes: 0.09 10*3/uL — ABNORMAL HIGH (ref 0.00–0.07)
Basophils Absolute: 0 10*3/uL (ref 0.0–0.1)
Basophils Relative: 0 %
Eosinophils Absolute: 0 10*3/uL (ref 0.0–0.5)
Eosinophils Relative: 0 %
HCT: 30.5 % — ABNORMAL LOW (ref 39.0–52.0)
Hemoglobin: 9 g/dL — ABNORMAL LOW (ref 13.0–17.0)
Immature Granulocytes: 1 %
Lymphocytes Relative: 8 %
Lymphs Abs: 0.9 10*3/uL (ref 0.7–4.0)
MCH: 24.3 pg — ABNORMAL LOW (ref 26.0–34.0)
MCHC: 29.5 g/dL — ABNORMAL LOW (ref 30.0–36.0)
MCV: 82.2 fL (ref 80.0–100.0)
Monocytes Absolute: 0.5 10*3/uL (ref 0.1–1.0)
Monocytes Relative: 5 %
Neutro Abs: 9.8 10*3/uL — ABNORMAL HIGH (ref 1.7–7.7)
Neutrophils Relative %: 86 %
Platelets: 348 10*3/uL (ref 150–400)
RBC: 3.71 MIL/uL — ABNORMAL LOW (ref 4.22–5.81)
RDW: 19.3 % — ABNORMAL HIGH (ref 11.5–15.5)
WBC: 11.3 10*3/uL — ABNORMAL HIGH (ref 4.0–10.5)
nRBC: 0 % (ref 0.0–0.2)

## 2020-03-16 LAB — URINE CULTURE: Culture: >=100000 — AB

## 2020-03-16 LAB — BASIC METABOLIC PANEL
Anion gap: 10 (ref 5–15)
BUN: 22 mg/dL (ref 8–23)
CO2: 22 mmol/L (ref 22–32)
Calcium: 8.7 mg/dL — ABNORMAL LOW (ref 8.9–10.3)
Chloride: 109 mmol/L (ref 98–111)
Creatinine, Ser: 1.95 mg/dL — ABNORMAL HIGH (ref 0.61–1.24)
GFR calc Af Amer: 41 mL/min — ABNORMAL LOW (ref >60)
GFR calc non Af Amer: 35 mL/min — ABNORMAL LOW (ref >60)
Glucose, Bld: 283 mg/dL — ABNORMAL HIGH (ref 70–99)
Potassium: 3.9 mmol/L (ref 3.5–5.1)
Sodium: 141 mmol/L (ref 135–145)

## 2020-03-16 LAB — GLUCOSE, CAPILLARY
Glucose-Capillary: 306 mg/dL — ABNORMAL HIGH (ref 70–99)
Glucose-Capillary: 201 mg/dL — ABNORMAL HIGH (ref 70–99)
Glucose-Capillary: 199 mg/dL — ABNORMAL HIGH (ref 70–99)
Glucose-Capillary: 180 mg/dL — ABNORMAL HIGH (ref 70–99)
Glucose-Capillary: 227 mg/dL — ABNORMAL HIGH (ref 70–99)

## 2020-03-16 LAB — MAGNESIUM: Magnesium: 2.2 mg/dL (ref 1.7–2.4)

## 2020-03-16 LAB — PHOSPHORUS: Phosphorus: 2.6 mg/dL (ref 2.5–4.6)

## 2020-03-16 NOTE — Progress Notes (Signed)
PROGRESS NOTE  Joseph JAGIELLO KGY:[id number] DOB: 1955/12/31 DOA: 03/13/2020 PCP: Patient, No Pcp Per  HPI/Recap of past 24 hours: HPI from PCCM 64 year old with HTN, DM, MM on daratumumab who presents to ED after wife found him gurgling in bed. Upon arrival to ED he was hypoxemic and altered. He was intubated on 03/13/20 in the ED. CTA demonstrated dense bilateral lower lobe consolidations and scattered nodular central GGOs in RUL as well as small subsegmental PE noted on an addendum. Hgb 10 above recent values in 8s. No blood seen when intubating and none in suction. Leukocytosis to 15k. Lactic acid 5.9 improved to 1.7 after IVF. Persistently hypoglycemic so D10 drip started. Pt admitted to the ICU. In the ICU, pt was further stabilized, self extubated on 03/14/20 and TRH assumed care on 03/16/20.    Today, patient denied any new complaints, denied any chest pain, worsening shortness of breath, cough, abdominal pain, nausea/vomiting, fever/chills.  Wife at bedside.   Assessment/Plan: Principal Problem:   Acute hypoxemic respiratory failure (HCC) Active Problems:   Aspiration pneumonia (HCC)   Severe sepsis (HCC)   Pulmonary embolism (HCC)   AKI (acute kidney injury) ([place])   Anemia   Multiple myeloma (HCC)   Hypertension   Severe sepsis likely 2/2 aspiration pneumonia Vs MSSA UTI On admission, leukocytosis, tachypnea, tachycardic, lactic acidosis Currently afebrile, with leukocytosis BC x2 NGTD UA positive for infection, UC grew greater than 100,000 staph aureus, likely MSSA CTA chest showed some groundglass areas and right upper lobe, small peripheral subsegmental PE SLP consulted, recommend regular/thin liquid, aspiration precautions Continue p.o. Augmentin to complete 7 days (ID okay with Augmentin for MSSA UTI) Monitor closely  Acute hypoxemic respiratory failure likely 2/2 aspiration pneumonia Intubated on 03/13/2020, self extubated on 03/14/2020 Currently on 2 L of O2,  saturating well Management as above Wean off oxygen when able  Small subsegmental PE History of MM Echo showed EF of 55 to 60%, no regional wall motion abnormalities Bilateral lower extremity Doppler negative for DVT S/p IV Heparin, switched to p.o. Eliquis Continue p.o. Eliquis  Diabetes mellitus type 2 Presented with hypoglycemia, currently uncontrolled with hyperglycemia A1c 8.1 SSI, Accu-Cheks, hypoglycemic protocol Plan to DC home glipizide (as hypoglycemic events may have occurred due to recently started glipizide)  Hypertension Continue home amlodipine, Coreg, continue to hold losartan, Imdur  AKI on CKD on CKD stage 3a Acute urinary retention Baseline creatinine 1.5-1.6 Daily BMP  Anemia of chronic disease Hemoglobin baseline around 8 Daily CBC  Multiple myeloma Outpatient follow-up with Dr. Irene Limbo  Debility PT/OT       Malnutrition Type:      Malnutrition Characteristics:      Nutrition Interventions:       Estimated body mass index is 27.68 kg/m as calculated from the following:   Height as of this encounter: $RemoveBeforeD'5\' 10"'[id number]$  (1.778 m).   Weight as of this encounter: 87.5 kg.     Code Status: Full  Family Communication: Discussed with wife at bedside  Disposition Plan: Status is: Inpatient  Remains inpatient appropriate because:Inpatient level of care appropriate due to severity of illness   Dispo: The patient is from: Home              Anticipated d/c is to: Home              Anticipated d/c date is: 2 days              Patient currently is not medically  stable to d/c.     Consultants:  PCCM  Procedures:  Mechanical ventilation on 03/13/2020  Antimicrobials:  Augmentin  DVT prophylaxis: Eliquis   Objective: Vitals:   03/15/20 1841 03/15/20 2109 03/16/20 0420 03/16/20 0858  BP: (!) 136/51 (!) 119/47 (!) 153/68 (!) 154/67  Pulse: 73 77 67 73  Resp: $Remo'19 18 18 18  'wFSlt$ Temp: 99.1 F (37.3 C) 97.8 F (36.6 C) 97.6 F (36.4 C)  97.8 F (36.6 C)  TempSrc: Oral  Oral Oral  SpO2: 99% 95% 100% 99%  Weight:   87.5 kg   Height:        Intake/Output Summary (Last 24 hours) at 03/16/2020 1606 Last data filed at 03/16/2020 0420 Gross per 24 hour  Intake 10.86 ml  Output 0 ml  Net 10.86 ml   Filed Weights   03/13/20 0641 03/14/20 0500 03/16/20 0420  Weight: 95.3 kg 85.1 kg 87.5 kg    Exam:  General: NAD   Cardiovascular: S1, S2 present  Respiratory: CTAB  Abdomen: Soft, nontender, nondistended, bowel sounds present  Musculoskeletal: No bilateral pedal edema noted  Skin: Normal  Psychiatry: Normal mood    Data Reviewed: CBC: Recent Labs  Lab 03/13/20 0642 03/13/20 0648 03/13/20 1740 03/14/20 0455 03/14/20 1838 03/15/20 0507 03/16/20 0559  WBC 15.3*  --   --  8.2  --  11.2* 11.3*  NEUTROABS 12.0*  --   --  7.3  --  10.0* 9.8*  HGB 10.3*   < > 7.2* 7.7* 8.4* 7.6* 9.0*  HCT 35.1*   < > 23.3* 25.0* 28.3* 25.9* 30.5*  MCV 84.4  --   --  80.6  --  82.0 82.2  PLT 415*  --   --  261  --  292 348   < > = values in this interval not displayed.   Basic Metabolic Panel: Recent Labs  Lab 03/14/20 0455 03/14/20 0455 03/14/20 1232 03/14/20 1838 03/15/20 0045 03/15/20 0507 03/16/20 0559  NA 139   < > 139 135 140 143 141  K 2.8*   < > 3.9 4.6 3.8 3.4* 3.9  CL 109   < > 110 105 109 111 109  CO2 22   < > 21* 19* 19* 21* 22  GLUCOSE 123*   < > 191* 498* 499* 188* 283*  BUN 16   < > 13 17 24* 25* 22  CREATININE 1.60*   < > 1.70* 1.90* 2.15* 1.98* 1.95*  CALCIUM 7.7*   < > 7.8* 7.9* 7.9* 8.2* 8.7*  MG 1.6*  --   --   --   --  2.3 2.2  PHOS 2.9  --   --   --   --  2.9 2.6   < > = values in this interval not displayed.   GFR: Estimated Creatinine Clearance: 39.5 mL/min (A) (by C-G formula based on SCr of 1.95 mg/dL (H)). Liver Function Tests: Recent Labs  Lab 03/13/20 0642  AST 18  ALT 11  ALKPHOS 61  BILITOT 0.6  PROT 7.0  ALBUMIN 2.7*   No results for input(s): LIPASE, AMYLASE in the  last 168 hours. Recent Labs  Lab 03/13/20 1025  AMMONIA 23   Coagulation Profile: Recent Labs  Lab 03/13/20 0642  INR 1.1   Cardiac Enzymes: No results for input(s): CKTOTAL, CKMB, CKMBINDEX, TROPONINI in the last 168 hours. BNP (last 3 results) No results for input(s): PROBNP in the last 8760 hours. HbA1C: Recent Labs    03/14/20 1855  HGBA1C 8.1*   CBG: Recent Labs  Lab 03/15/20 2110 03/15/20 2356 03/16/20 0422 03/16/20 0748 03/16/20 1205  GLUCAP 400* 400* 306* 201* 199*   Lipid Profile: No results for input(s): CHOL, HDL, LDLCALC, TRIG, CHOLHDL, LDLDIRECT in the last 72 hours. Thyroid Function Tests: No results for input(s): TSH, T4TOTAL, FREET4, T3FREE, THYROIDAB in the last 72 hours. Anemia Panel: No results for input(s): VITAMINB12, FOLATE, FERRITIN, TIBC, IRON, RETICCTPCT in the last 72 hours. Urine analysis:    Component Value Date/Time   COLORURINE YELLOW 03/13/2020 1706   APPEARANCEUR TURBID (A) 03/13/2020 1706   LABSPEC 1.024 03/13/2020 1706   PHURINE 7.0 03/13/2020 1706   GLUCOSEU NEGATIVE 03/13/2020 1706   HGBUR MODERATE (A) 03/13/2020 1706   BILIRUBINUR NEGATIVE 03/13/2020 1706   KETONESUR NEGATIVE 03/13/2020 1706   PROTEINUR 100 (A) 03/13/2020 1706   NITRITE NEGATIVE 03/13/2020 1706   LEUKOCYTESUR LARGE (A) 03/13/2020 1706   Sepsis Labs: $RemoveBefo'@LABRCNTIP'WPqKlbymtat$ (procalcitonin:4,lacticidven:4)  ) Recent Results (from the past 240 hour(s))  SARS Coronavirus 2 by RT PCR (hospital order, performed in [facility] hospital lab) Nasopharyngeal Nasopharyngeal Swab     Status: None   Collection Time: 03/13/20  7:03 AM   Specimen: Nasopharyngeal Swab  Result Value Ref Range Status   SARS Coronavirus 2 NEGATIVE NEGATIVE Final    Comment: (NOTE) SARS-CoV-2 target nucleic acids are NOT DETECTED.  The SARS-CoV-2 RNA is generally detectable in upper and lower respiratory specimens during the acute phase of infection. The lowest concentration of SARS-CoV-2 viral  copies this assay can detect is 250 copies / mL. A negative result does not preclude SARS-CoV-2 infection and should not be used as the sole basis for treatment or other patient management decisions.  A negative result may occur with improper specimen collection / handling, submission of specimen other than nasopharyngeal swab, presence of viral mutation(s) within the areas targeted by this assay, and inadequate number of viral copies (<250 copies / mL). A negative result must be combined with clinical observations, patient history, and epidemiological information.  Fact Sheet for Patients:   StrictlyIdeas.no  Fact Sheet for Healthcare Providers: BankingDealers.co.za  This test is not yet approved or  cleared by the [place] FDA and has been authorized for detection and/or diagnosis of SARS-CoV-2 by FDA under an Emergency Use Authorization (EUA).  This EUA will remain in effect (meaning this test can be used) for the duration of the COVID-19 declaration under Section 564(b)(1) of the Act, 21 U.S.C. section 360bbb-3(b)(1), unless the authorization is terminated or revoked sooner.  Performed at [facility], [place] [address] [place], [place] [address]   Culture, blood (Routine X 2) w Reflex to ID Panel     Status: None (Preliminary result)   Collection Time: 03/13/20 10:25 AM   Specimen: BLOOD  Result Value Ref Range Status   Specimen Description BLOOD RIGHT ANTECUBITAL  Final   Special Requests   Final    BOTTLES DRAWN AEROBIC AND ANAEROBIC Blood Culture results may not be optimal due to an inadequate volume of blood received in culture bottles   Culture   Final    NO GROWTH 2 DAYS Performed at [facility], [place] [address], [place], [place] [address]    Report Status PENDING  Incomplete  Culture, blood (Routine X 2) w Reflex to ID Panel     Status: None (Preliminary result)   Collection Time: 03/13/20 10:30 AM    Specimen: BLOOD RIGHT HAND  Result Value Ref Range Status  Specimen Description BLOOD RIGHT HAND  Final   Special Requests   Final    BOTTLES DRAWN AEROBIC AND ANAEROBIC Blood Culture results may not be optimal due to an inadequate volume of blood received in culture bottles   Culture   Final    NO GROWTH 2 DAYS Performed at [facility], [place] [address], [place], [place] [address]    Report Status PENDING  Incomplete  Urine culture     Status: Abnormal   Collection Time: 03/13/20  5:06 PM   Specimen: Urine, Random  Result Value Ref Range Status   Specimen Description URINE, RANDOM  Final   Special Requests   Final    NONE Performed at [facility], [place] [address], [place], [place] [address]    Culture >=100,000 COLONIES/mL STAPHYLOCOCCUS AUREUS (A)  Final   Report Status 03/16/2020 FINAL  Final   Organism ID, Bacteria STAPHYLOCOCCUS AUREUS (A)  Final      Susceptibility   Staphylococcus aureus - MIC*    CIPROFLOXACIN <=0.5 SENSITIVE Sensitive     GENTAMICIN <=0.5 SENSITIVE Sensitive     NITROFURANTOIN <=16 SENSITIVE Sensitive     OXACILLIN <=0.25 SENSITIVE Sensitive     TETRACYCLINE <=1 SENSITIVE Sensitive     VANCOMYCIN <=0.5 SENSITIVE Sensitive     TRIMETH/SULFA <=10 SENSITIVE Sensitive     CLINDAMYCIN <=0.25 SENSITIVE Sensitive     RIFAMPIN <=0.5 SENSITIVE Sensitive     Inducible Clindamycin NEGATIVE Sensitive     * >=100,000 COLONIES/mL STAPHYLOCOCCUS AUREUS  MRSA PCR Screening     Status: None   Collection Time: 03/14/20 12:58 AM   Specimen: Nasal Mucosa; Nasopharyngeal  Result Value Ref Range Status   MRSA by PCR NEGATIVE NEGATIVE Final    Comment:        The GeneXpert MRSA Assay (FDA approved for NASAL specimens only), is one component of a comprehensive MRSA colonization surveillance program. It is not intended to diagnose MRSA infection nor to guide or monitor treatment for MRSA infections. Performed at [facility], [place] [address],  [place], [place] [address]       Studies: No results found.  Scheduled Meds: . acyclovir  400 mg Oral BID  . amLODipine  5 mg Oral Daily  . amoxicillin-clavulanate  1 tablet Oral Q12H  . apixaban  10 mg Oral BID   Followed by  . [START ON 03/22/2020] apixaban  5 mg Oral BID  . aspirin  81 mg Oral Daily  . atorvastatin  80 mg Oral Daily  . carvedilol  25 mg Oral BID WC  . Chlorhexidine Gluconate Cloth  6 each Topical Daily  . famotidine  20 mg Oral BID  . insulin aspart  0-6 Units Subcutaneous Q4H  . mouth rinse  15 mL Mouth Rinse BID  . sodium chloride flush  3 mL Intravenous Q12H    Continuous Infusions:   LOS: 3 days     Alma Friendly, MD [facility]  If 7PM-7AM, please contact night-coverage [email or web address] 03/16/2020, 4:06 PM

## 2020-03-17 DIAGNOSIS — N179 Acute kidney failure, unspecified: Secondary | ICD-10-CM | POA: Diagnosis not present

## 2020-03-17 DIAGNOSIS — J9601 Acute respiratory failure with hypoxia: Secondary | ICD-10-CM | POA: Diagnosis not present

## 2020-03-17 DIAGNOSIS — I10 Essential (primary) hypertension: Secondary | ICD-10-CM | POA: Diagnosis not present

## 2020-03-17 DIAGNOSIS — J690 Pneumonitis due to inhalation of food and vomit: Secondary | ICD-10-CM | POA: Diagnosis not present

## 2020-03-17 LAB — CBC WITH DIFFERENTIAL/PLATELET
Abs Immature Granulocytes: 0.1 10*3/uL — ABNORMAL HIGH (ref 0.00–0.07)
Basophils Absolute: 0 10*3/uL (ref 0.0–0.1)
Basophils Relative: 0 %
Eosinophils Absolute: 0 10*3/uL (ref 0.0–0.5)
Eosinophils Relative: 0 %
HCT: 27.7 % — ABNORMAL LOW (ref 39.0–52.0)
Hemoglobin: 8.3 g/dL — ABNORMAL LOW (ref 13.0–17.0)
Immature Granulocytes: 1 %
Lymphocytes Relative: 14 %
Lymphs Abs: 1 10*3/uL (ref 0.7–4.0)
MCH: 24.4 pg — ABNORMAL LOW (ref 26.0–34.0)
MCHC: 30 g/dL (ref 30.0–36.0)
MCV: 81.5 fL (ref 80.0–100.0)
Monocytes Absolute: 0.6 10*3/uL (ref 0.1–1.0)
Monocytes Relative: 8 %
Neutro Abs: 5.4 10*3/uL (ref 1.7–7.7)
Neutrophils Relative %: 77 %
Platelets: 322 10*3/uL (ref 150–400)
RBC: 3.4 MIL/uL — ABNORMAL LOW (ref 4.22–5.81)
RDW: 19.1 % — ABNORMAL HIGH (ref 11.5–15.5)
WBC: 7.1 10*3/uL (ref 4.0–10.5)
nRBC: 0 % (ref 0.0–0.2)

## 2020-03-17 LAB — PHOSPHORUS: Phosphorus: 2.2 mg/dL — ABNORMAL LOW (ref 2.5–4.6)

## 2020-03-17 LAB — BASIC METABOLIC PANEL
Anion gap: 10 (ref 5–15)
BUN: 16 mg/dL (ref 8–23)
CO2: 25 mmol/L (ref 22–32)
Calcium: 8.8 mg/dL — ABNORMAL LOW (ref 8.9–10.3)
Chloride: 111 mmol/L (ref 98–111)
Creatinine, Ser: 1.66 mg/dL — ABNORMAL HIGH (ref 0.61–1.24)
GFR calc Af Amer: 50 mL/min — ABNORMAL LOW (ref >60)
GFR calc non Af Amer: 43 mL/min — ABNORMAL LOW (ref >60)
Glucose, Bld: 180 mg/dL — ABNORMAL HIGH (ref 70–99)
Potassium: 3.2 mmol/L — ABNORMAL LOW (ref 3.5–5.1)
Sodium: 146 mmol/L — ABNORMAL HIGH (ref 135–145)

## 2020-03-17 LAB — GLUCOSE, CAPILLARY
Glucose-Capillary: 141 mg/dL — ABNORMAL HIGH (ref 70–99)
Glucose-Capillary: 160 mg/dL — ABNORMAL HIGH (ref 70–99)
Glucose-Capillary: 162 mg/dL — ABNORMAL HIGH (ref 70–99)
Glucose-Capillary: 162 mg/dL — ABNORMAL HIGH (ref 70–99)
Glucose-Capillary: 163 mg/dL — ABNORMAL HIGH (ref 70–99)
Glucose-Capillary: 183 mg/dL — ABNORMAL HIGH (ref 70–99)

## 2020-03-17 LAB — MAGNESIUM: Magnesium: 1.9 mg/dL (ref 1.7–2.4)

## 2020-03-17 MED ORDER — POTASSIUM CHLORIDE CRYS ER 20 MEQ PO TBCR
40.0000 meq | EXTENDED_RELEASE_TABLET | Freq: Once | ORAL | Status: AC
  Administered 2020-03-17: 40 meq via ORAL
  Filled 2020-03-17: qty 2

## 2020-03-17 NOTE — Progress Notes (Signed)
PROGRESS NOTE  [name] QVZ:[id number] DOB: 02-May-1956 DOA: 03/13/2020 PCP: Patient, No Pcp Per  HPI/Recap of past 24 hours: HPI from PCCM 64 year old with HTN, DM, MM on daratumumab who presents to ED after wife found him gurgling in bed. Upon arrival to ED he was hypoxemic and altered. He was intubated on 03/13/20 in the ED. CTA demonstrated dense bilateral lower lobe consolidations and scattered nodular central GGOs in RUL as well as small subsegmental PE noted on an addendum. Hgb 10 above recent values in 8s. No blood seen when intubating and none in suction. Leukocytosis to 15k. Lactic acid 5.9 improved to 1.7 after IVF. Persistently hypoglycemic so D10 drip started. Pt admitted to the ICU. In the ICU, pt was further stabilized, self extubated on 03/14/20 and TRH assumed care on 03/16/20.    Today, patient denied any worsening shortness of breath, chest pain, abdominal pain, nausea/vomiting, fever/chills.  Still reports generalized fatigue   Assessment/Plan: Principal Problem:   Acute hypoxemic respiratory failure (HCC) Active Problems:   Aspiration pneumonia (HCC)   Severe sepsis (HCC)   Pulmonary embolism (HCC)   AKI (acute kidney injury) ([place])   Anemia   Multiple myeloma (HCC)   Hypertension   Severe sepsis likely 2/2 aspiration pneumonia Vs MSSA UTI On admission, leukocytosis, tachypnea, tachycardic, lactic acidosis Currently afebrile, with leukocytosis BC x2 NGTD UA positive for infection, UC grew greater than 100,000 staph aureus, likely MSSA CTA chest showed some groundglass areas and right upper lobe, small peripheral subsegmental PE SLP consulted, recommend regular/thin liquid, aspiration precautions Continue p.o. Augmentin to complete 7 days (ID okay with Augmentin for MSSA UTI) Monitor closely  Acute hypoxemic respiratory failure likely 2/2 aspiration pneumonia Intubated on 03/13/2020, self extubated on 03/14/2020 Currently on 2 L of O2, saturating  well Management as above Wean off oxygen when able  Small subsegmental PE History of MM Echo showed EF of 55 to 60%, no regional wall motion abnormalities Bilateral lower extremity Doppler negative for DVT S/p IV Heparin, switched to p.o. Eliquis Continue p.o. Eliquis  Diabetes mellitus type 2 Presented with hypoglycemia, currently uncontrolled with hyperglycemia A1c 8.1 SSI, Accu-Cheks, hypoglycemic protocol Plan to DC home glipizide (as hypoglycemic events may have occurred due to recently started glipizide)  Hypertension Continue home amlodipine, Coreg, continue to hold losartan, Imdur  AKI on CKD on CKD stage 3a Acute urinary retention Baseline creatinine 1.5-1.6 Daily BMP  Anemia of chronic disease Hemoglobin baseline around 8 Daily CBC  Multiple myeloma Outpatient follow-up with Dr. Irene Limbo  Debility PT/OT       Malnutrition Type:      Malnutrition Characteristics:      Nutrition Interventions:       Estimated body mass index is 27.62 kg/m as calculated from the following:   Height as of this encounter: _0  (1.778 m).   Weight as of this encounter: 87.3 kg.     Code Status: Full  Family Communication: Discussed with wife at bedside on 03/17/20  Disposition Plan: Status is: Inpatient  Remains inpatient appropriate because:Inpatient level of care appropriate due to severity of illness   Dispo: The patient is from: Home              Anticipated d/c is to: Home              Anticipated d/c date is: 2 days              Patient currently is not medically stable to  d/c.     Consultants:  PCCM  Procedures:  Mechanical ventilation on 03/13/2020  Antimicrobials:  Augmentin  DVT prophylaxis: Eliquis   Objective: Vitals:   03/16/20 2039 03/17/20 0448 03/17/20 0906 03/17/20 0909  BP: (!) 148/62 (!) 148/60 (!) 177/52 (!) 189/66  Pulse: 74 79 (!) 59 78  Resp: _0 Temp: 98.2 F (36.8 C) 98.1 F (36.7 C) 98.3 F (36.8 C)  98.8 F (37.1 C)  TempSrc:   Oral Oral  SpO2: 97% 99% 91% 99%  Weight:  87.3 kg    Height:        Intake/Output Summary (Last 24 hours) at 03/17/2020 1507 Last data filed at 03/17/2020 0600 Gross per 24 hour  Intake 360 ml  Output 0 ml  Net 360 ml   Filed Weights   03/14/20 0500 03/16/20 0420 03/17/20 0448  Weight: 85.1 kg 87.5 kg 87.3 kg    Exam:  General: NAD   Cardiovascular: S1, S2 present  Respiratory: CTAB  Abdomen: Soft, nontender, nondistended, bowel sounds present  Musculoskeletal: No bilateral pedal edema noted  Skin: Normal  Psychiatry: Normal mood    Data Reviewed: CBC: Recent Labs  Lab 03/13/20 0642 03/13/20 0648 03/14/20 0455 03/14/20 1838 03/15/20 0507 03/16/20 0559 03/17/20 0226  WBC 15.3*  --  8.2  --  11.2* 11.3* 7.1  NEUTROABS 12.0*  --  7.3  --  10.0* 9.8* 5.4  HGB 10.3*   < > 7.7* 8.4* 7.6* 9.0* 8.3*  HCT 35.1*   < > 25.0* 28.3* 25.9* 30.5* 27.7*  MCV 84.4  --  80.6  --  82.0 82.2 81.5  PLT 415*  --  261  --  292 348 322   < > = values in this interval not displayed.   Basic Metabolic Panel: Recent Labs  Lab 03/14/20 0455 03/14/20 1232 03/14/20 1838 03/15/20 0045 03/15/20 0507 03/16/20 0559 03/17/20 0226  NA 139   < > 135 140 143 141 146*  K 2.8*   < > 4.6 3.8 3.4* 3.9 3.2*  CL 109   < > 105 109 111 109 111  CO2 22   < > 19* 19* 21* 22 25  GLUCOSE 123*   < > 498* 499* 188* 283* 180*  BUN 16   < > 17 24* 25* 22 16  CREATININE 1.60*   < > 1.90* 2.15* 1.98* 1.95* 1.66*  CALCIUM 7.7*   < > 7.9* 7.9* 8.2* 8.7* 8.8*  MG 1.6*  --   --   --  2.3 2.2 1.9  PHOS 2.9  --   --   --  2.9 2.6 2.2*   < > = values in this interval not displayed.   GFR: Estimated Creatinine Clearance: 46.4 mL/min (A) (by C-G formula based on SCr of 1.66 mg/dL (H)). Liver Function Tests: Recent Labs  Lab 03/13/20 0642  AST 18  ALT 11  ALKPHOS 61  BILITOT 0.6  PROT 7.0  ALBUMIN 2.7*   No results for input(s): LIPASE, AMYLASE in the last 168  hours. Recent Labs  Lab 03/13/20 1025  AMMONIA 23   Coagulation Profile: Recent Labs  Lab 03/13/20 0642  INR 1.1   Cardiac Enzymes: No results for input(s): CKTOTAL, CKMB, CKMBINDEX, TROPONINI in the last 168 hours. BNP (last 3 results) No results for input(s): PROBNP in the last 8760 hours. HbA1C: Recent Labs    03/14/20 1855  HGBA1C 8.1*   CBG: Recent Labs  Lab 03/16/20 2040 03/17/20  0012 03/17/20 0448 03/17/20 0750 03/17/20 1152  GLUCAP 227* 183* 163* 160* 141*   Lipid Profile: No results for input(s): CHOL, HDL, LDLCALC, TRIG, CHOLHDL, LDLDIRECT in the last 72 hours. Thyroid Function Tests: No results for input(s): TSH, T4TOTAL, FREET4, T3FREE, THYROIDAB in the last 72 hours. Anemia Panel: No results for input(s): VITAMINB12, FOLATE, FERRITIN, TIBC, IRON, RETICCTPCT in the last 72 hours. Urine analysis:    Component Value Date/Time   COLORURINE YELLOW 03/13/2020 1706   APPEARANCEUR TURBID (A) 03/13/2020 1706   LABSPEC 1.024 03/13/2020 1706   PHURINE 7.0 03/13/2020 1706   GLUCOSEU NEGATIVE 03/13/2020 1706   HGBUR MODERATE (A) 03/13/2020 1706   BILIRUBINUR NEGATIVE 03/13/2020 1706   KETONESUR NEGATIVE 03/13/2020 1706   PROTEINUR 100 (A) 03/13/2020 1706   NITRITE NEGATIVE 03/13/2020 1706   LEUKOCYTESUR LARGE (A) 03/13/2020 1706   Sepsis Labs: _0 (procalcitonin:4,lacticidven:4)  ) Recent Results (from the past 240 hour(s))  SARS Coronavirus 2 by RT PCR (hospital order, performed in [place] hospital lab) Nasopharyngeal Nasopharyngeal Swab     Status: None   Collection Time: 03/13/20  7:03 AM   Specimen: Nasopharyngeal Swab  Result Value Ref Range Status   SARS Coronavirus 2 NEGATIVE NEGATIVE Final    Comment: (NOTE) SARS-CoV-2 target nucleic acids are NOT DETECTED.  The SARS-CoV-2 RNA is generally detectable in upper and lower respiratory specimens during the acute phase of infection. The lowest concentration of SARS-CoV-2 viral copies  this assay can detect is 250 copies / mL. A negative result does not preclude SARS-CoV-2 infection and should not be used as the sole basis for treatment or other patient management decisions.  A negative result may occur with improper specimen collection / handling, submission of specimen other than nasopharyngeal swab, presence of viral mutation(s) within the areas targeted by this assay, and inadequate number of viral copies (<250 copies / mL). A negative result must be combined with clinical observations, patient history, and epidemiological information.  Fact Sheet for Patients:   StrictlyIdeas.no  Fact Sheet for Healthcare Providers: BankingDealers.co.za  This test is not yet approved or  cleared by the [place] FDA and has been authorized for detection and/or diagnosis of SARS-CoV-2 by FDA under an Emergency Use Authorization (EUA).  This EUA will remain in effect (meaning this test can be used) for the duration of the COVID-19 declaration under Section 564(b)(1) of the Act, 21 U.S.C. section 360bbb-3(b)(1), unless the authorization is terminated or revoked sooner.  Performed at [facility], [place] [address], [place], [place] [address]   Culture, blood (Routine X 2) w Reflex to ID Panel     Status: None (Preliminary result)   Collection Time: 03/13/20 10:25 AM   Specimen: BLOOD  Result Value Ref Range Status   Specimen Description BLOOD RIGHT ANTECUBITAL  Final   Special Requests   Final    BOTTLES DRAWN AEROBIC AND ANAEROBIC Blood Culture results may not be optimal due to an inadequate volume of blood received in culture bottles   Culture   Final    NO GROWTH 4 DAYS Performed at [facility], [place] [address], [place], [place] [address]    Report Status PENDING  Incomplete  Culture, blood (Routine X 2) w Reflex to ID Panel     Status: None (Preliminary result)   Collection Time: 03/13/20 10:30 AM   Specimen:  BLOOD RIGHT HAND  Result Value Ref Range Status   Specimen Description BLOOD RIGHT HAND  Final   Special  Requests   Final    BOTTLES DRAWN AEROBIC AND ANAEROBIC Blood Culture results may not be optimal due to an inadequate volume of blood received in culture bottles   Culture   Final    NO GROWTH 4 DAYS Performed at [facility], [place] [address], [place], [place] [address]    Report Status PENDING  Incomplete  Urine culture     Status: Abnormal   Collection Time: 03/13/20  5:06 PM   Specimen: Urine, Random  Result Value Ref Range Status   Specimen Description URINE, RANDOM  Final   Special Requests   Final    NONE Performed at [facility], [place] [address], [place], [place] [address]    Culture >=100,000 COLONIES/mL STAPHYLOCOCCUS AUREUS (A)  Final   Report Status 03/16/2020 FINAL  Final   Organism ID, Bacteria STAPHYLOCOCCUS AUREUS (A)  Final      Susceptibility   Staphylococcus aureus - MIC*    CIPROFLOXACIN <=0.5 SENSITIVE Sensitive     GENTAMICIN <=0.5 SENSITIVE Sensitive     NITROFURANTOIN <=16 SENSITIVE Sensitive     OXACILLIN <=0.25 SENSITIVE Sensitive     TETRACYCLINE <=1 SENSITIVE Sensitive     VANCOMYCIN <=0.5 SENSITIVE Sensitive     TRIMETH/SULFA <=10 SENSITIVE Sensitive     CLINDAMYCIN <=0.25 SENSITIVE Sensitive     RIFAMPIN <=0.5 SENSITIVE Sensitive     Inducible Clindamycin NEGATIVE Sensitive     * >=100,000 COLONIES/mL STAPHYLOCOCCUS AUREUS  MRSA PCR Screening     Status: None   Collection Time: 03/14/20 12:58 AM   Specimen: Nasal Mucosa; Nasopharyngeal  Result Value Ref Range Status   MRSA by PCR NEGATIVE NEGATIVE Final    Comment:        The GeneXpert MRSA Assay (FDA approved for NASAL specimens only), is one component of a comprehensive MRSA colonization surveillance program. It is not intended to diagnose MRSA infection nor to guide or monitor treatment for MRSA infections. Performed at [facility], [place] [address],  [place], [place] [address]       Studies: No results found.  Scheduled Meds: . acyclovir  400 mg Oral BID  . amLODipine  5 mg Oral Daily  . amoxicillin-clavulanate  1 tablet Oral Q12H  . apixaban  10 mg Oral BID   Followed by  . [START ON 03/22/2020] apixaban  5 mg Oral BID  . aspirin  81 mg Oral Daily  . atorvastatin  80 mg Oral Daily  . carvedilol  25 mg Oral BID WC  . Chlorhexidine Gluconate Cloth  6 each Topical Daily  . famotidine  20 mg Oral BID  . insulin aspart  0-6 Units Subcutaneous Q4H  . mouth rinse  15 mL Mouth Rinse BID  . sodium chloride flush  3 mL Intravenous Q12H    Continuous Infusions:   LOS: 4 days     Alma Friendly, MD [facility]  If 7PM-7AM, please contact night-coverage [email or web address] 03/17/2020, 3:07 PM

## 2020-03-17 NOTE — Discharge Instructions (Signed)
Information on my medicine - ELIQUIS (apixaban)  Why was Eliquis prescribed for you? Eliquis was prescribed to treat blood clots that may have been found in the veins of your legs (deep vein thrombosis) or in your lungs (pulmonary embolism) and to reduce the risk of them occurring again.  What do You need to know about Eliquis ? The starting dose is 10 mg (two 5 mg tablets) taken TWICE daily for the FIRST SEVEN (7) DAYS, then on (enter date)  9/10  the dose is reduced to ONE 5 mg tablet taken TWICE daily.  Eliquis may be taken with or without food.   Try to take the dose about the same time in the morning and in the evening. If you have difficulty swallowing the tablet whole please discuss with your pharmacist how to take the medication safely.  Take Eliquis exactly as prescribed and DO NOT stop taking Eliquis without talking to the doctor who prescribed the medication.  Stopping may increase your risk of developing a new blood clot.  Refill your prescription before you run out.  After discharge, you should have regular check-up appointments with your healthcare provider that is prescribing your Eliquis.    What do you do if you miss a dose? If a dose of ELIQUIS is not taken at the scheduled time, take it as soon as possible on the same day and twice-daily administration should be resumed. The dose should not be doubled to make up for a missed dose.  Important Safety Information A possible side effect of Eliquis is bleeding. You should call your healthcare provider right away if you experience any of the following: ? Bleeding from an injury or your nose that does not stop. ? Unusual colored urine (red or dark brown) or unusual colored stools (red or black). ? Unusual bruising for unknown reasons. ? A serious fall or if you hit your head (even if there is no bleeding).  Some medicines may interact with Eliquis and might increase your risk of bleeding or clotting while on Eliquis.  To help avoid this, consult your healthcare provider or pharmacist prior to using any new prescription or non-prescription medications, including herbals, vitamins, non-steroidal anti-inflammatory drugs (NSAIDs) and supplements.  This website has more information on Eliquis (apixaban): http://www.eliquis.com/eliquis/home

## 2020-03-17 NOTE — Evaluation (Signed)
Physical Therapy Evaluation Patient Details Name: [name] MRN: [id number] DOB: 1956-03-27 Today's Date: 03/17/2020   History of Present Illness  Pt is a 64 yo male presenting with respiratory distress and decreased responsiveness. There was evidence upon EMS arrival, with concern for aspiration and bilateral lower lobe consolidations suggestive of PNA. CTA also showed PE. CT Head negative. He was intubated in the ED 9/1; self-extubated early 9/2. PMH includes: CAD, HTN, PAD, CHF, DMII, multiple myeloma  Clinical Impression  Pt admitted with above diagnosis. Comes from home where he lives with his wife in a two story home with a flight of steps to reach his bedroom and bathroom; Presents to PT with notably stiff gait and some generalized weakness, but overall managing uncomplicated ambulation well;  Tells me he is close to his baseline; Pt currently with functional limitations due to the deficits listed below (see PT Problem List). Pt will benefit from skilled PT to increase their independence and safety with mobility to allow discharge to the venue listed below.       Follow Up Recommendations Outpatient PT (for gait smoothness -- he will likely decline)    Equipment Recommendations  None recommended by PT    Recommendations for Other Services       Precautions / Restrictions Precautions Precautions: None      Mobility  Bed Mobility Overal bed mobility: Modified Independent                Transfers Overall transfer level: Modified independent Equipment used: None             General transfer comment: Notable dependence on UEs to push up  Ambulation/Gait Ambulation/Gait assistance: Min guard;Supervision Gait Distance (Feet): 250 Feet Assistive device: None Gait Pattern/deviations: Step-through pattern;Decreased step length - right;Decreased step length - left;Decreased stride length     General Gait Details: Stiff gait with shorter step length and dec  arm swing; no overt loss of balance during uncomplicated ambulation  Stairs         General stair comments: Pt opted not to go up stairs today  Wheelchair Mobility    Modified Rankin (Stroke Patients Only)       Balance     Sitting balance-Leahy Scale: Good       Standing balance-Leahy Scale: Good                               Pertinent Vitals/Pain Pain Assessment: No/denies pain Faces Pain Scale: No hurt    Home Living Family/patient expects to be discharged to:: Private residence Living Arrangements: Spouse/significant other Available Help at Discharge: Family;Available PRN/intermittently Type of Home: House Home Access: Stairs to enter   [employer] of Steps: 2 Home Layout: Two level;Bed/bath upstairs        Prior Function Level of Independence: Independent               Hand Dominance        Extremity/Trunk Assessment   Upper Extremity Assessment Upper Extremity Assessment:  (noting some gross hand weakness)    Lower Extremity Assessment Lower Extremity Assessment: Generalized weakness       Communication   Communication: No difficulties  Cognition Arousal/Alertness: Awake/alert Behavior During Therapy: WFL for tasks assessed/performed;Flat affect Overall Cognitive Status: Within Functional Limits for tasks assessed  General Comments General comments (skin integrity, edema, etc.): Pt mentioed how he has many appointments    Exercises     Assessment/Plan    PT Assessment Patient needs continued PT services  PT Problem List Decreased strength;Decreased activity tolerance;Decreased mobility;Decreased coordination       PT Treatment Interventions DME instruction;Gait training;Stair training;Functional mobility training;Therapeutic activities;Therapeutic exercise;Balance training;Neuromuscular re-education;Cognitive remediation;Patient/family education     PT Goals (Current goals can be found in the Care Plan section)  Acute Rehab PT Goals Patient Stated Goal: Hopes to get home soon PT Goal Formulation: With patient Time For Goal Achievement: 03/31/20 Potential to Achieve Goals: Good    Frequency Min 3X/week   Barriers to discharge        Co-evaluation               AM-PAC PT "6 Clicks" Mobility  Outcome Measure Help needed turning from your back to your side while in a flat bed without using bedrails?: None Help needed moving from lying on your back to sitting on the side of a flat bed without using bedrails?: None Help needed moving to and from a bed to a chair (including a wheelchair)?: None Help needed standing up from a chair using your arms (e.g., wheelchair or bedside chair)?: None Help needed to walk in hospital room?: A Little Help needed climbing 3-5 steps with a railing? : A Little 6 Click Score: 22    End of Session Equipment Utilized During Treatment: Gait belt Activity Tolerance: Patient tolerated treatment well Patient left: in chair;with call bell/phone within reach;with family/visitor present;with chair alarm set Nurse Communication: Mobility status PT Visit Diagnosis: Other abnormalities of gait and mobility (R26.89)    Time: 1610-9604 PT Time Calculation (min) (ACUTE ONLY): 26 min   Charges:   PT Evaluation $PT Eval Low Complexity: 1 Low PT Treatments $Gait Training: 8-22 mins        Roney Marion, PT  Acute Rehabilitation Services Pager [phone] Office [place] 03/17/2020, 1:57 PM

## 2020-03-18 ENCOUNTER — Other Ambulatory Visit: Payer: Self-pay

## 2020-03-18 ENCOUNTER — Encounter (HOSPITAL_COMMUNITY): Payer: Self-pay | Admitting: Pulmonary Disease

## 2020-03-18 DIAGNOSIS — I10 Essential (primary) hypertension: Secondary | ICD-10-CM | POA: Diagnosis not present

## 2020-03-18 DIAGNOSIS — J9601 Acute respiratory failure with hypoxia: Secondary | ICD-10-CM | POA: Diagnosis not present

## 2020-03-18 DIAGNOSIS — N179 Acute kidney failure, unspecified: Secondary | ICD-10-CM | POA: Diagnosis not present

## 2020-03-18 DIAGNOSIS — J690 Pneumonitis due to inhalation of food and vomit: Secondary | ICD-10-CM | POA: Diagnosis not present

## 2020-03-18 LAB — CBC WITH DIFFERENTIAL/PLATELET
Abs Immature Granulocytes: 0.13 10*3/uL — ABNORMAL HIGH (ref 0.00–0.07)
Basophils Absolute: 0 10*3/uL (ref 0.0–0.1)
Basophils Relative: 0 %
Eosinophils Absolute: 0.1 10*3/uL (ref 0.0–0.5)
Eosinophils Relative: 1 %
HCT: 26.5 % — ABNORMAL LOW (ref 39.0–52.0)
Hemoglobin: 7.9 g/dL — ABNORMAL LOW (ref 13.0–17.0)
Immature Granulocytes: 2 %
Lymphocytes Relative: 17 %
Lymphs Abs: 1.2 10*3/uL (ref 0.7–4.0)
MCH: 24.2 pg — ABNORMAL LOW (ref 26.0–34.0)
MCHC: 29.8 g/dL — ABNORMAL LOW (ref 30.0–36.0)
MCV: 81 fL (ref 80.0–100.0)
Monocytes Absolute: 0.7 10*3/uL (ref 0.1–1.0)
Monocytes Relative: 10 %
Neutro Abs: 4.8 10*3/uL (ref 1.7–7.7)
Neutrophils Relative %: 70 %
Platelets: 278 10*3/uL (ref 150–400)
RBC: 3.27 MIL/uL — ABNORMAL LOW (ref 4.22–5.81)
RDW: 18.8 % — ABNORMAL HIGH (ref 11.5–15.5)
WBC: 6.8 10*3/uL (ref 4.0–10.5)
nRBC: 0 % (ref 0.0–0.2)

## 2020-03-18 LAB — CULTURE, BLOOD (ROUTINE X 2)
Culture: NO GROWTH
Culture: NO GROWTH

## 2020-03-18 LAB — BASIC METABOLIC PANEL
Anion gap: 10 (ref 5–15)
BUN: 13 mg/dL (ref 8–23)
CO2: 23 mmol/L (ref 22–32)
Calcium: 8.7 mg/dL — ABNORMAL LOW (ref 8.9–10.3)
Chloride: 109 mmol/L (ref 98–111)
Creatinine, Ser: 1.65 mg/dL — ABNORMAL HIGH (ref 0.61–1.24)
GFR calc Af Amer: 50 mL/min — ABNORMAL LOW (ref >60)
GFR calc non Af Amer: 43 mL/min — ABNORMAL LOW (ref >60)
Glucose, Bld: 137 mg/dL — ABNORMAL HIGH (ref 70–99)
Potassium: 3.3 mmol/L — ABNORMAL LOW (ref 3.5–5.1)
Sodium: 142 mmol/L (ref 135–145)

## 2020-03-18 LAB — GLUCOSE, CAPILLARY
Glucose-Capillary: 118 mg/dL — ABNORMAL HIGH (ref 70–99)
Glucose-Capillary: 129 mg/dL — ABNORMAL HIGH (ref 70–99)
Glucose-Capillary: 138 mg/dL — ABNORMAL HIGH (ref 70–99)
Glucose-Capillary: 146 mg/dL — ABNORMAL HIGH (ref 70–99)

## 2020-03-18 LAB — PHOSPHORUS: Phosphorus: 3.1 mg/dL (ref 2.5–4.6)

## 2020-03-18 LAB — MAGNESIUM: Magnesium: 1.8 mg/dL (ref 1.7–2.4)

## 2020-03-18 MED ORDER — POTASSIUM CHLORIDE CRYS ER 20 MEQ PO TBCR
40.0000 meq | EXTENDED_RELEASE_TABLET | Freq: Once | ORAL | Status: AC
  Administered 2020-03-18: 40 meq via ORAL
  Filled 2020-03-18: qty 2

## 2020-03-18 MED ORDER — AMOXICILLIN-POT CLAVULANATE 875-125 MG PO TABS: 1.0000 | ORAL_TABLET | Freq: Two times a day (BID) | ORAL | 0 refills | Status: AC

## 2020-03-18 MED ORDER — APIXABAN 5 MG PO TABS: ORAL_TABLET | ORAL | 0 refills | Status: DC

## 2020-03-18 NOTE — Progress Notes (Signed)
Physical Therapy Treatment and Discharge Patient Details Name: [name] MRN: [id number] DOB: [birth year] Today's Date: 03/18/2020    History of Present Illness Pt is a 64 yo male presenting with respiratory distress and decreased responsiveness. There was evidence upon EMS arrival, with concern for aspiration and bilateral lower lobe consolidations suggestive of PNA. CTA also showed PE. CT Head negative. He was intubated in the ED 9/1; self-extubated early 9/2. PMH includes: CAD, HTN, PAD, CHF, DMII, multiple myeloma    PT Comments    Continuing work on functional mobility and activity tolerance;  Session focused on further amb and stair negotiation; Managed stairs slowly, but without major difficulty; OK for dc home from PT standpoint   Follow Up Recommendations  Outpatient PT (for gait smoothness -- he will likely decline)     Equipment Recommendations  None recommended by PT    Recommendations for Other Services       Precautions / Restrictions Precautions Precautions: None Restrictions Weight Bearing Restrictions: Yes    Mobility  Bed Mobility Overal bed mobility: Independent                Transfers Overall transfer level: Independent Equipment used: None                Ambulation/Gait Ambulation/Gait assistance: Modified independent (Device/Increase time) Gait Distance (Feet): 160 Feet Assistive device: None Gait Pattern/deviations: Step-through pattern     General Gait Details: Stiff gait with shorter step length and dec arm swing; no overt loss of balance during uncomplicated ambulation   Stairs Stairs: Yes Stairs assistance: Supervision Stair Management: One rail Right;One rail Left;Alternating pattern;Step to pattern;Forwards (alternating ascending; step-by-step descending) Number of Stairs: 12 General stair comments: Notable dyspnea 2/4 at top of steps; pt denied any discomfort   Wheelchair Mobility    Modified Rankin (Stroke  Patients Only)       Balance     Sitting balance-Leahy Scale: Good       Standing balance-Leahy Scale: Good                              Cognition Arousal/Alertness: Awake/alert Behavior During Therapy: WFL for tasks assessed/performed;Flat affect Overall Cognitive Status: Within Functional Limits for tasks assessed                                 General Comments: pt donning his street clothes upon arrival, had removed his telemetry and stated he was going home, agreeable to telemetry being redonned      Exercises      General Comments        Pertinent Vitals/Pain Pain Assessment: No/denies pain    Home Living Family/patient expects to be discharged to:: Private residence Living Arrangements: Spouse/significant other Available Help at Discharge: Family;Available PRN/intermittently Type of Home: House Home Access: Stairs to enter   Home Layout: Two level;Bed/bath upstairs        Prior Function Level of Independence: Independent          PT Goals (current goals can now be found in the care plan section) Acute Rehab PT Goals Patient Stated Goal: to go home today Progress towards PT goals: Goals met/education completed, patient discharged from PT    Frequency    Min 3X/week      PT Plan Current plan remains appropriate    Co-evaluation  AM-PAC PT "6 Clicks" Mobility   Outcome Measure  Help needed turning from your back to your side while in a flat bed without using bedrails?: None Help needed moving from lying on your back to sitting on the side of a flat bed without using bedrails?: None Help needed moving to and from a bed to a chair (including a wheelchair)?: None Help needed standing up from a chair using your arms (e.g., wheelchair or bedside chair)?: None Help needed to walk in hospital room?: None Help needed climbing 3-5 steps with a railing? : A Little 6 Click Score: 23    End of Session  Equipment Utilized During Treatment: Gait belt Activity Tolerance: Patient tolerated treatment well Patient left: Other (comment) (managing independently in room)   PT Visit Diagnosis: Other abnormalities of gait and mobility (R26.89)     Time: 5456-2563 PT Time Calculation (min) (ACUTE ONLY): 10 min  Charges:  $Gait Training: 8-22 mins                     Roney Marion, PT  Acute Rehabilitation Services Pager [phone] Office [phone]    [place] 03/18/2020, 1:11 PM

## 2020-03-18 NOTE — Evaluation (Signed)
Occupational Therapy Evaluation Patient Details Name: [name] MRN: [id number] DOB: Oct 25, 1955 Today's Date: 03/18/2020    History of Present Illness Pt is a 64 yo male presenting with respiratory distress and decreased responsiveness. There was evidence upon EMS arrival, with concern for aspiration and bilateral lower lobe consolidations suggestive of PNA. CTA also showed PE. CT Head negative. He was intubated in the ED 9/1; self-extubated early 9/2. PMH includes: CAD, HTN, PAD, CHF, DMII, multiple myeloma   Clinical Impression   No family to determine baseline cognition of pt. He was up ambulating about his room and putting on his clothing to go home. He was oriented and following commands and agreed to letting OT replace telemetry he'd left off when removing his gown. Pt overall functioning at a supervision to independent level. Recommending home with supervision of his wife.     Follow Up Recommendations  No OT follow up    Equipment Recommendations  None recommended by OT    Recommendations for Other Services       Precautions / Restrictions Precautions Precautions: None Restrictions Weight Bearing Restrictions: Yes      Mobility Bed Mobility Overal bed mobility: Independent                Transfers Overall transfer level: Independent Equipment used: None                  Balance     Sitting balance-Leahy Scale: Good       Standing balance-Leahy Scale: Good                             ADL either performed or assessed with clinical judgement   ADL                                         General ADL Comments: Overall, functioning at a supervision level for safety.      Vision Patient Visual Report: No change from baseline       Perception     Praxis      Pertinent Vitals/Pain Pain Assessment: No/denies pain     Hand Dominance Right   Extremity/Trunk Assessment Upper Extremity Assessment Upper  Extremity Assessment: Overall WFL for tasks assessed (atrophy in thumb-finger webspace)   Lower Extremity Assessment Lower Extremity Assessment: Defer to PT evaluation   Cervical / Trunk Assessment Cervical / Trunk Assessment: Normal   Communication Communication Communication: No difficulties   Cognition Arousal/Alertness: Awake/alert Behavior During Therapy: WFL for tasks assessed/performed;Flat affect Overall Cognitive Status: No family/caregiver present to determine baseline cognitive functioning                                 General Comments: pt donning his street clothes upon arrival, had removed his telemetry and stated he was going home, agreeable to telemetry being redonned   General Comments       Exercises     Shoulder Instructions      Home Living Family/patient expects to be discharged to:: Private residence Living Arrangements: Spouse/significant other Available Help at Discharge: Family;Available PRN/intermittently Type of Home: House Home Access: Stairs to enter [employer] of Steps: 2   Home Layout: Two level;Bed/bath upstairs Alternate Level Stairs-Number of Steps: 12 Alternate Level Stairs-Rails: Right Bathroom Shower/Tub:  Tub/shower unit   Bathroom Toilet: Standard                Prior Functioning/Environment Level of Independence: Independent                 OT Problem List:        OT Treatment/Interventions:      OT Goals(Current goals can be found in the care plan section) Acute Rehab OT Goals Patient Stated Goal: to go home at 11:00  OT Frequency:     Barriers to D/C:            Co-evaluation              AM-PAC OT "6 Clicks" Daily Activity     Outcome Measure Help from another person eating meals?: None Help from another person taking care of personal grooming?: None Help from another person toileting, which includes using toliet, bedpan, or urinal?: None Help from another person  bathing (including washing, rinsing, drying)?: None Help from another person to put on and taking off regular upper body clothing?: None Help from another person to put on and taking off regular lower body clothing?: None 6 Click Score: 24   End of Session    Activity Tolerance: Patient tolerated treatment well Patient left: in chair;with call bell/phone within reach  OT Visit Diagnosis: Other abnormalities of gait and mobility (R26.89)                Time: 8592-9244 OT Time Calculation (min): 19 min Charges:  OT General Charges $OT Visit: 1 Visit OT Evaluation $OT Eval Moderate Complexity: 1 Mod  [name], OTR/L Acute Rehabilitation Services Pager: [phone] Office: [phone]  [name] 03/18/2020, 11:06 AM

## 2020-03-18 NOTE — Plan of Care (Signed)
  Problem: Health Behavior/Discharge Planning: Goal: Ability to manage health-related needs will improve Outcome: Adequate for Discharge   Problem: Clinical Measurements: Goal: Will remain free from infection Outcome: Adequate for Discharge Goal: Diagnostic test results will improve Outcome: Adequate for Discharge Goal: Respiratory complications will improve Outcome: Adequate for Discharge Goal: Cardiovascular complication will be avoided Outcome: Adequate for Discharge   Problem: Activity: Goal: Risk for activity intolerance will decrease 03/18/2020 1328 by Dolores Hoose, RN Outcome: Adequate for Discharge 03/18/2020 0141 by Dolores Hoose, RN Outcome: Progressing   Problem: Nutrition: Goal: Adequate nutrition will be maintained Outcome: Adequate for Discharge   Problem: Safety: Goal: Ability to remain free from injury will improve 03/18/2020 1328 by Dolores Hoose, RN Outcome: Adequate for Discharge 03/18/2020 0826 by Dolores Hoose, RN Outcome: Progressing

## 2020-03-18 NOTE — Discharge Summary (Signed)
Discharge Summary  [name] HQR:[id number] DOB: 06/06/1956  PCP: Isaac Bliss, Rayford Halsted, MD  Admit date: 03/13/2020 Discharge date: 03/18/2020  Time spent: 40 mins  Recommendations for Outpatient Follow-up:  1. PCP in 1 week for follow up  Discharge Diagnoses:  Active Hospital Problems   Diagnosis Date Noted  . Acute hypoxemic respiratory failure ([place]) 03/13/2020  . Aspiration pneumonia ([place]) 03/13/2020  . Severe sepsis ([place]) 03/13/2020  . Pulmonary embolism ([place]) 03/13/2020  . AKI (acute kidney injury) ([place]) 03/13/2020  . Anemia 03/13/2020  . Multiple myeloma ([place]) 03/13/2020  . Hypertension 03/13/2020    Resolved Hospital Problems  No resolved problems to display.    Discharge Condition: Stable  Diet recommendation: Mod carb, heart healthy   Vitals:   03/18/20 0407 03/18/20 1046  BP: (!) 146/60 (!) 170/87  Pulse: 78 82  Resp: 15 16  Temp:  98.1 F (36.7 C)  SpO2: 99% 100%    History of present illness:  64 year old with HTN, DM, MM on daratumumab who presents to ED after wife found him gurgling in bed. Upon arrival to ED he was hypoxemic and altered. He was intubated on 03/13/20 in the ED. CTA demonstrated dense bilateral lower lobe consolidations and scattered nodular central GGOs in RUL as well as small subsegmental PE noted on an addendum. Hgb 10 above recent values in 8s. No blood seen when intubating and none in suction. Leukocytosis to 15k. Lactic acid 5.9 improved to 1.7 after IVF. Persistently hypoglycemic so D10 drip started. Pt admitted to the ICU. In the ICU, pt was further stabilized, self extubated on 03/14/20 and TR[facility] assumed care on 03/16/20.   Today, patient denied any new complaints.  Denied any difficulty breathing, chest pain, abdominal pain, nausea/vomiting, fever/chills.  Wife at bedside, discussed discharge plans and follow-up.   Hospital Course:  Principal Problem:   Acute hypoxemic respiratory failure (HCC) Active Problems:    Aspiration pneumonia (HCC)   Severe sepsis (HCC)   Pulmonary embolism (HCC)   AKI (acute kidney injury) ([place])   Anemia   Multiple myeloma (HCC)   Hypertension   Severe sepsis likely 2/2 aspiration pneumonia Vs MSSA UTI On admission, leukocytosis, tachypnea, tachycardic, lactic acidosis BC x2 NGTD UA positive for infection, UC grew greater than 100,000 staph aureus, likely MSSA CTA chest showed some groundglass areas and right upper lobe, small peripheral subsegmental PE SLP consulted, recommend regular/thin liquid, aspiration precautions Continue p.o. Augmentin to complete 7 days (ID okay with Augmentin for MSSA UTI) Follow up with PCP  Acute hypoxemic respiratory failure likely 2/2 aspiration pneumonia Intubated on 03/13/2020, self extubated on 03/14/2020 Currently sat well on RA  Small subsegmental PE History of MM Echo showed EF of 55 to 60%, no regional wall motion abnormalities Bilateral lower extremity Doppler negative for DVT Continue p.o. Eliquis Follow up with PCP  Diabetes mellitus type 2, uncontrolled with hypergylcemia Presented with hypoglycemia A1c 8.1 Continue home metformin, DC home glipizide (as hypoglycemic events may have occurred due to recently started glipizide) Follow up with PCP for further management  Hypertension Continue home amlodipine, Coreg, Imdur and hydralazine Hold home losartan for now till PCP visit  AKI on CKD on CKD stage 3a Acute urinary retention s/p foley, voiding well Baseline creatinine 1.5-1.6  Anemia of chronic disease Hemoglobin baseline around 8  Multiple myeloma Outpatient follow-up with Dr. Irene Limbo  Debility PT/OT- No PT/OT follow up        Malnutrition Type:  Malnutrition Characteristics:      Nutrition Interventions:      Estimated body mass index is 27.24 kg/m as calculated from the following:   Height as of this encounter: 5' 10" (1.778 m).   Weight as of this encounter: 86.1 kg.     Procedures:  Mechanical ventilation   Consultations: PCCM   Discharge Exam: BP (!) 170/87   Pulse 82   Temp 98.1 F (36.7 C) (Oral)   Resp 16   Ht 5' 10" (1.778 m)   Wt 86.1 kg   SpO2 100%   BMI 27.24 kg/m    General: NAD Cardiovascular: S1, S2 present Respiratory: CTAB   Discharge Instructions You were cared for by a hospitalist during your hospital stay. If you have any questions about your discharge medications or the care you received while you were in the hospital after you are discharged, you can call the unit and asked to speak with the hospitalist on call if the hospitalist that took care of you is not available. Once you are discharged, your primary care physician will handle any further medical issues. Please note that NO REFILLS for any discharge medications will be authorized once you are discharged, as it is imperative that you return to your primary care physician (or establish a relationship with a primary care physician if you do not have one) for your aftercare needs so that they can reassess your need for medications and monitor your lab values.  Discharge Instructions    Diet - low sodium heart healthy   Complete by: As directed    Increase activity slowly   Complete by: As directed      Allergies as of 03/18/2020   No Known Allergies     Medication List    STOP taking these medications   losartan 100 MG tablet Commonly known as: COZAAR     TAKE these medications   acyclovir 400 MG tablet Commonly known as: ZOVIRAX Take 400 mg by mouth in the morning and at bedtime.   amLODipine 5 MG tablet Commonly known as: NORVASC Take 5 mg by mouth daily.   amoxicillin-clavulanate 875-125 MG tablet Commonly known as: AUGMENTIN Take 1 tablet by mouth every 12 (twelve) hours for 2 days.   apixaban 5 MG Tabs tablet Commonly known as: Eliquis Take 2 tablets (6m) twice daily for 4 days, then 1 tablet (547m twice daily   aspirin EC 81 MG  tablet Take 81 mg by mouth daily. Swallow whole.   atorvastatin 80 MG tablet Commonly known as: LIPITOR Take 80 mg by mouth daily.   carvedilol 25 MG tablet Commonly known as: COREG Take 25 mg by mouth 2 (two) times daily with a meal.   dexamethasone 4 MG tablet Commonly known as: DECADRON Take 12 mg by mouth See admin instructions. Take after chemo treatments   hydrALAZINE 50 MG tablet Commonly known as: APRESOLINE Take 50 mg by mouth 3 (three) times daily.   isosorbide mononitrate 30 MG 24 hr tablet Commonly known as: IMDUR Take 30 mg by mouth daily.   metFORMIN 500 MG tablet Commonly known as: GLUCOPHAGE Take 1,000 mg by mouth daily with breakfast.   ondansetron 8 MG tablet Commonly known as: ZOFRAN Take 8 mg by mouth every 8 (eight) hours as needed for nausea or vomiting.   predniSONE 20 MG tablet Commonly known as: DELTASONE Take 20 mg by mouth daily as needed (arthiritis).   prochlorperazine 10 MG tablet Commonly known as: COMPAZINE Take 10 mg  by mouth every 6 (six) hours as needed for nausea or vomiting.      No Known Allergies  Follow-up Information    [facility]. Call.   Why: make an appointment for hospital follow up as soon as possible Contact information: [place] [address] [phone]       Isaac Bliss, Rayford Halsted, MD. Schedule an appointment as soon as possible for a visit in 1 week(s).   Specialty: Internal Medicine Contact information: [place] [address] [phone]                The results of significant diagnostics from this hospitalization (including imaging, microbiology, ancillary and laboratory) are listed below for reference.    Significant Diagnostic Studies: DG Abdomen 1 View  Result Date: 03/13/2020 CLINICAL DATA:  Orogastric tube placement EXAM: ABDOMEN - 1 VIEW COMPARISON:  None. FINDINGS: Limited AP semi upright  radiograph of the upper abdomen was obtained for the purposes of enteric tube localization. A feeding tube is seen coursing below the diaphragm with distal tip terminating in the expected location of the distal stomach proximal duodenum. There is marked dilation of the bilateral renal collecting systems which have excreted contrast from previous contrasted CT. Ill-defined collection of air projects over the peripheral aspect of the right hemiabdomen, which may represent air and stool within bowel. IMPRESSION: 1. Feeding tube tip terminates in the expected location of the distal stomach/proximal duodenum. 2. Severe bilateral hydroureteronephrosis. CT of the abdomen and pelvis is recommended to evaluate for source of obstructive uropathy. 3. Ill-defined collection of air projects over the peripheral aspect of the right hemiabdomen, which may represent air and stool within bowel. This can also be further evaluated with previously recommended CT. Electronically Signed   By: Davina Poke D.O.   On: 03/13/2020 14:12   CT HEAD WO CONTRAST  Result Date: 03/13/2020 CLINICAL DATA:  Neuro deficit. Unresponsive, found down now intubated. EXAM: CT HEAD WITHOUT CONTRAST TECHNIQUE: Contiguous axial images were obtained from the base of the skull through the vertex without intravenous contrast. COMPARISON:  None FINDINGS: Brain: No evidence of acute infarction, hemorrhage, hydrocephalus, extra-axial collection or mass lesion/mass effect. 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 Vascular: No hyperdense vessel or unexpected calcification. Skull: Normal. Negative for fracture or focal lesion. Sinuses/Orbits: Mild mucosal thickening of the RIGHT maxillary sinus. Fluid in the nasopharynx likely related to endotracheal tube. Scattered ethmoid opacification as well. Other: None IMPRESSION: 1. No acute intracranial abnormality. 2. 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 Electronically Signed   By: Zetta Bills M.D.   On: 03/13/2020 10:31   CT Angio Chest PE W/Cm &/Or Wo Cm  Addendum Date: 03/13/2020   ADDENDUM REPORT: 03/13/2020 12:02 ADDENDUM: On image 148 of series 4 there is a subtle filling defect in subsegmental branches to the lingula also seen on image 117 of series 7 compatible with small peripheral, subsegmental pulmonary embolus. This represents a change from the initial report. These results were called by telephone at the time of interpretation on 03/13/2020 at 12:02 pm to provider [facility] , who verbally acknowledged these results. Electronically Signed   By: Zetta Bills M.D.   On: 03/13/2020 12:02   Result Date: 03/13/2020 CLINICAL DATA:  PE suspected. EXAM: CT ANGIOGRAPHY CHEST WITH CONTRAST TECHNIQUE: Multidetector CT imaging of the chest was  performed using the standard protocol during bolus administration of intravenous contrast. Multiplanar CT image reconstructions and MIPs were obtained to evaluate the vascular anatomy. CONTRAST:  158m OMNIPAQUE IOHEXOL 350 MG/ML SOLN COMPARISON:  Chest x-ray of the same date. Previous chest CT from 2006. FINDINGS: Cardiovascular: Study quality is adequate. No sign of pulmonary embolism. Post median sternotomy for CABG with three-vessel native coronary artery disease. Cardiac enlargement with LEFT ventricular hypertrophy without pericardial effusion. Mediastinum/Nodes: Endotracheal tube terminates in the mid trachea as seen on the recent chest x-ray. Esophagus mildly patulous. No thoracic inlet adenopathy. No axillary adenopathy. Lungs/Pleura: Basilar airspace consolidation and patchy areas of ground-glass attenuation. Ground-glass changes are worse in the RIGHT chest. Consolidative changes are seen dependently in the lung bases with patent airways, large airways. Peripheral airways are fluid-filled. Hypoenhancement of parenchyma is seen bilaterally.  No pleural effusion. Discrete ground-glass nodules are present, greater than 10 in the RIGHT upper lobe, example is demonstrated on image 27 of series 5 with a 9 mm ground-glass nodule. While there are ground-glass changes in the lingula. There are no areas of nodularity in the LEFT chest. Upper Abdomen: Incidental imaging of upper abdominal contents is unremarkable for acute process with very limited coverage. Musculoskeletal: Mild body wall edema. Degenerative changes of the spine. No acute musculoskeletal process. Signs of median sternotomy. Review of the MIP images confirms the above findings. IMPRESSION: 1. Negative for pulmonary embolism. 2. Basilar airspace consolidation bilaterally slightly worse on the RIGHT, associated with areas of ground-glass opacity compatible with multifocal pneumonia. 3. Some of these ground-glass areas are more nodular and discrete particularly in the RIGHT upper lobe. Suggest follow-up CT in 3 months to ensure resolution. 4. Cardiac enlargement with LEFT ventricular hypertrophy. Post median sternotomy for CABG with three-vessel native coronary artery disease. 5. Endotracheal tube terminates in the mid as seen on the recent chest x-ray. 6. Mild body wall edema. 7. Aortic atherosclerosis. Aortic Atherosclerosis (ICD10-I70.0). Electronically Signed: By: GZetta BillsM.D. On: 03/13/2020 10:44   DG Chest Port 1 View  Result Date: 03/14/2020 CLINICAL DATA:  Shortness of breath and respiratory distress EXAM: PORTABLE CHEST 1 VIEW COMPARISON:  CT March 13, 2020 FINDINGS: The heart size and mediastinal contours are mildly enlarged. Aortic knob calcifications are seen. Overlying median sternotomy wires are present. Hazy patchy airspace opacities are seen within the right mid and lower lungs there is also left perihilar airspace opacities. There is slight interval improvement from the prior exam. IMPRESSION: Slight interval improvement in the multifocal bilateral airspace opacities.  Electronically Signed   By: BPrudencio PairM.D.   On: 03/14/2020 02:09   DG Chest Port 1 View  Result Date: 03/13/2020 CLINICAL DATA:  Endotracheal tube placement. EXAM: PORTABLE CHEST 1 VIEW COMPARISON:  11/25/2017 FINDINGS: 0706 hours. Endotracheal tube tip is approximately 3.6 cm above the base of the carina. The cardio pericardial silhouette is enlarged. Low lung volumes with focal airspace opacity in the right mid lung and retrocardiac left base. Possible tiny left pleural effusion. The visualized bony structures of the thorax show no acute abnormality. Telemetry leads overlie the chest. IMPRESSION: 1. Endotracheal tube tip approximately 3.6 cm above the base of the carina. 2. Low lung volumes with focal airspace disease in the right mid lung and retrocardiac left base. Imaging features suspicious for multifocal pneumonia. Electronically Signed   By: EMisty StanleyM.D.   On: 03/13/2020 07:27   EEG adult  Result Date: 03/13/2020 YLora Havens MD     03/13/2020  10:08 AM Patient Name: SANTEZ WOODCOX MRN: [id number] Epilepsy Attending: Lora Havens Referring Physician/Provider: Dr. Kathrynn Speed Date: 03/13/2020 Duration: 24.11 mins Patient history: 64 year old male with decreased responsiveness in the setting of hypoxia, tachycardia, hypertension. EEG to evaluate for seizure Level of alertness: comatose AEDs during EEG study: Propofol Technical aspects: This EEG study was done with scalp electrodes positioned according to the 10-20 International system of electrode placement. Electrical activity was acquired at a sampling rate of 500Hz and reviewed with a high frequency filter of 70Hz and a low frequency filter of 1Hz. EEG data were recorded continuously and digitally stored. Description: EEG showed continuous generalized 2-3Hz delta slowing with overriding 15-18hz generalized beta activity. Hyperventilation and photic stimulation were not performed.   ABNORMALITY -Continuous slow, generalized -  Excessive beta, generalized IMPRESSION: This study is suggestive of severe diffuse encephalopathy, nonspecific etiology but could be related to sedation. No seizures or epileptiform discharges were seen throughout the recording. Lora Havens   ECHOCARDIOGRAM COMPLETE  Result Date: 03/14/2020    ECHOCARDIOGRAM REPORT   Patient Name:   ALIOU MEALEY Date of Exam: 03/14/2020 Medical Rec #:  [id number]         Height:       70.0 in Accession #:    [id number]        Weight:       187.6 lb Date of Birth:  04/26/1956        BSA:          2.031 m Patient Age:    39 years          BP:           125/58 mmHg Patient Gender: M                 HR:           74 bpm. Exam Location:  Inpatient Procedure: 2D Echo Indications:    Pulmonary Embolus I26.99  History:        Patient has no prior history of Echocardiogram examinations.  Sonographer:    Mikki Santee RDCS (AE) Referring Phys: [place]  1. Left ventricular ejection fraction, by estimation, is 55 to 60%. The left ventricle has normal function. The left ventricle has no regional wall motion abnormalities. Left ventricular diastolic parameters were normal.  2. Right ventricular systolic function is normal. The right ventricular size is normal.  3. Left atrial size was mildly dilated.  4. The mitral valve is normal in structure. Trivial mitral valve regurgitation. No evidence of mitral stenosis.  5. The aortic valve is normal in structure. Aortic valve regurgitation is trivial. No aortic stenosis is present.  6. The inferior vena cava is normal in size with greater than 50% respiratory variability, suggesting right atrial pressure of 3 mmHg. FINDINGS  Left Ventricle: Left ventricular ejection fraction, by estimation, is 55 to 60%. The left ventricle has normal function. The left ventricle has no regional wall motion abnormalities. The left ventricular internal cavity size was normal in size. There is  no left ventricular hypertrophy. Left  ventricular diastolic parameters were normal. Right Ventricle: The right ventricular size is normal. No increase in right ventricular wall thickness. Right ventricular systolic function is normal. Left Atrium: Left atrial size was mildly dilated. Right Atrium: Right atrial size was normal in size. Pericardium: Trivial pericardial effusion is present. The pericardial effusion is lateral to the left ventricle. Mitral Valve: The mitral valve is normal in structure.  Normal mobility of the mitral valve leaflets. Trivial mitral valve regurgitation. No evidence of mitral valve stenosis. Tricuspid Valve: The tricuspid valve is normal in structure. Tricuspid valve regurgitation is trivial. No evidence of tricuspid stenosis. Aortic Valve: The aortic valve is normal in structure. Aortic valve regurgitation is trivial. No aortic stenosis is present. Pulmonic Valve: The pulmonic valve was normal in structure. Pulmonic valve regurgitation is mild. No evidence of pulmonic stenosis. Aorta: The aortic root is normal in size and structure. Venous: The inferior vena cava is normal in size with greater than 50% respiratory variability, suggesting right atrial pressure of 3 mmHg. IAS/Shunts: No atrial level shunt detected by color flow Doppler.  LEFT VENTRICLE PLAX 2D LVIDd:         5.30 cm  Diastology LVIDs:         3.70 cm  LV e' lateral:   5.03 cm/s LV PW:         1.00 cm  LV E/e' lateral: 26.2 LV IVS:        1.10 cm  LV e' medial:    5.68 cm/s LVOT diam:     2.20 cm  LV E/e' medial:  23.2 LV SV:         71 LV SV Index:   35 LVOT Area:     3.80 cm  RIGHT VENTRICLE RV S prime:     10.80 cm/s TAPSE (M-mode): 2.0 cm LEFT ATRIUM           Index       RIGHT ATRIUM           Index LA diam:      4.30 cm 2.12 cm/m  RA Area:     22.10 cm LA Vol (A2C): 64.3 ml 31.65 ml/m RA Volume:   61.60 ml  30.32 ml/m LA Vol (A4C): 65.7 ml 32.34 ml/m  AORTIC VALVE LVOT Vmax:   75.00 cm/s LVOT Vmean:  54.500 cm/s LVOT VTI:    0.187 m  AORTA Ao Root  diam: 2.90 cm MITRAL VALVE MV Area (PHT): 3.37 cm     SHUNTS MV Decel Time: 225 msec     Systemic VTI:  0.19 m MV E velocity: 132.00 cm/s  Systemic Diam: 2.20 cm MV A velocity: 99.10 cm/s MV E/A ratio:  1.33 Jenkins Rouge MD Electronically signed by Jenkins Rouge MD Signature Date/Time: 03/14/2020/1:53:02 PM    Final    VAS [place] LOWER EXTREMITY VENOUS (DVT)  Result Date: 03/14/2020  Lower Venous DVTStudy Indications: Pulmonary embolism.  Risk Factors: Confirmed PE. Limitations: Poor ultrasound/tissue interface. Comparison Study: No prior studies. Performing Technologist: Oliver Hum RVT Supporting Technologist: Leavy Cella RDCS  Examination Guidelines: A complete evaluation includes B-mode imaging, spectral Doppler, color Doppler, and power Doppler as needed of all accessible portions of each vessel. Bilateral testing is considered an integral part of a complete examination. Limited examinations for reoccurring indications may be performed as noted. The reflux portion of the exam is performed with the patient in reverse Trendelenburg.  +---------+---------------+---------+-----------+----------+--------------+ RIGHT    CompressibilityPhasicitySpontaneityPropertiesThrombus Aging +---------+---------------+---------+-----------+----------+--------------+ CFV      Full           Yes      Yes                                 +---------+---------------+---------+-----------+----------+--------------+ SFJ      Full                                                        +---------+---------------+---------+-----------+----------+--------------+  FV Prox  Full                                                        +---------+---------------+---------+-----------+----------+--------------+ FV Mid   Full                                                        +---------+---------------+---------+-----------+----------+--------------+ FV DistalFull                                                         +---------+---------------+---------+-----------+----------+--------------+ PFV      Full                                                        +---------+---------------+---------+-----------+----------+--------------+ POP      Full           Yes      Yes                                 +---------+---------------+---------+-----------+----------+--------------+ PTV      Full                                                        +---------+---------------+---------+-----------+----------+--------------+ PERO     Full                                                        +---------+---------------+---------+-----------+----------+--------------+   +---------+---------------+---------+-----------+----------+--------------+ LEFT     CompressibilityPhasicitySpontaneityPropertiesThrombus Aging +---------+---------------+---------+-----------+----------+--------------+ CFV      Full           Yes      Yes                                 +---------+---------------+---------+-----------+----------+--------------+ SFJ      Full                                                        +---------+---------------+---------+-----------+----------+--------------+ FV Prox  Full                                                        +---------+---------------+---------+-----------+----------+--------------+  FV Mid   Full                                                        +---------+---------------+---------+-----------+----------+--------------+ FV DistalFull                                                        +---------+---------------+---------+-----------+----------+--------------+ PFV      Full                                                        +---------+---------------+---------+-----------+----------+--------------+ POP      Full           Yes      Yes                                  +---------+---------------+---------+-----------+----------+--------------+ PTV      Full                                                        +---------+---------------+---------+-----------+----------+--------------+ PERO     Full                                                        +---------+---------------+---------+-----------+----------+--------------+     Summary: RIGHT: - There is no evidence of deep vein thrombosis in the lower extremity.  - No cystic structure found in the popliteal fossa.  LEFT: - There is no evidence of deep vein thrombosis in the lower extremity.  - No cystic structure found in the popliteal fossa.  *See table(s) above for measurements and observations. Electronically signed by Servando Snare MD on 03/14/2020 at 4:49:06 PM.    Final     Microbiology: Recent Results (from the past 240 hour(s))  SARS Coronavirus 2 by RT PCR (hospital order, performed in [facility] hospital lab) Nasopharyngeal Nasopharyngeal Swab     Status: None   Collection Time: 03/13/20  7:03 AM   Specimen: Nasopharyngeal Swab  Result Value Ref Range Status   SARS Coronavirus 2 NEGATIVE NEGATIVE Final    Comment: (NOTE) SARS-CoV-2 target nucleic acids are NOT DETECTED.  The SARS-CoV-2 RNA is generally detectable in upper and lower respiratory specimens during the acute phase of infection. The lowest concentration of SARS-CoV-2 viral copies this assay can detect is 250 copies / mL. A negative result does not preclude SARS-CoV-2 infection and should not be used as the sole basis for treatment or other patient management decisions.  A negative result may occur with improper specimen collection / handling, submission of specimen other than nasopharyngeal swab, presence of viral mutation(s) within  the areas targeted by this assay, and inadequate number of viral copies (<250 copies / mL). A negative result must be combined with clinical observations, patient history, and epidemiological  information.  Fact Sheet for Patients:   StrictlyIdeas.no  Fact Sheet for Healthcare Providers: BankingDealers.co.za  This test is not yet approved or  cleared by the [place] FDA and has been authorized for detection and/or diagnosis of SARS-CoV-2 by FDA under an Emergency Use Authorization (EUA).  This EUA will remain in effect (meaning this test can be used) for the duration of the COVID-19 declaration under Section 564(b)(1) of the Act, 21 U.S.C. section 360bbb-3(b)(1), unless the authorization is terminated or revoked sooner.  Performed at [facility], [place] [address] [place], [place] [address]   Culture, blood (Routine X 2) w Reflex to ID Panel     Status: None   Collection Time: 03/13/20 10:25 AM   Specimen: BLOOD  Result Value Ref Range Status   Specimen Description BLOOD RIGHT ANTECUBITAL  Final   Special Requests   Final    BOTTLES DRAWN AEROBIC AND ANAEROBIC Blood Culture results may not be optimal due to an inadequate volume of blood received in culture bottles   Culture   Final    NO GROWTH 5 DAYS Performed at [facility], [place] [address] [place], [place] [address]    Report Status 03/18/2020 FINAL  Final  Culture, blood (Routine X 2) w Reflex to ID Panel     Status: None   Collection Time: 03/13/20 10:30 AM   Specimen: BLOOD RIGHT HAND  Result Value Ref Range Status   Specimen Description BLOOD RIGHT HAND  Final   Special Requests   Final    BOTTLES DRAWN AEROBIC AND ANAEROBIC Blood Culture results may not be optimal due to an inadequate volume of blood received in culture bottles   Culture   Final    NO GROWTH 5 DAYS Performed at [facility], [place] [address], [place], [place] [address]    Report Status 03/18/2020 FINAL  Final  Urine culture     Status: Abnormal   Collection Time: 03/13/20  5:06 PM   Specimen: Urine, Random  Result Value Ref Range Status   Specimen Description URINE,  RANDOM  Final   Special Requests   Final    NONE Performed at [facility], [place] [address], [place], [place] [address]    Culture >=100,000 COLONIES/mL STAPHYLOCOCCUS AUREUS (A)  Final   Report Status 03/16/2020 FINAL  Final   Organism ID, Bacteria STAPHYLOCOCCUS AUREUS (A)  Final      Susceptibility   Staphylococcus aureus - MIC*    CIPROFLOXACIN <=0.5 SENSITIVE Sensitive     GENTAMICIN <=0.5 SENSITIVE Sensitive     NITROFURANTOIN <=16 SENSITIVE Sensitive     OXACILLIN <=0.25 SENSITIVE Sensitive     TETRACYCLINE <=1 SENSITIVE Sensitive     VANCOMYCIN <=0.5 SENSITIVE Sensitive     TRIMETH/SULFA <=10 SENSITIVE Sensitive     CLINDAMYCIN <=0.25 SENSITIVE Sensitive     RIFAMPIN <=0.5 SENSITIVE Sensitive     Inducible Clindamycin NEGATIVE Sensitive     * >=100,000 COLONIES/mL STAPHYLOCOCCUS AUREUS  MRSA PCR Screening     Status: None   Collection Time: 03/14/20 12:58 AM   Specimen: Nasal Mucosa; Nasopharyngeal  Result Value Ref Range Status   MRSA by PCR NEGATIVE NEGATIVE Final    Comment:        The GeneXpert MRSA Assay (FDA approved  for NASAL specimens only), is one component of a comprehensive MRSA colonization surveillance program. It is not intended to diagnose MRSA infection nor to guide or monitor treatment for MRSA infections. Performed at [facility], [place] [address], [place], [place] [address]      Labs: Basic Metabolic Panel: Recent Labs  Lab 03/14/20 0455 03/14/20 1232 03/15/20 0045 03/15/20 0507 03/16/20 0559 03/17/20 0226 03/18/20 0339  NA 139   < > 140 143 141 146* 142  K 2.8*   < > 3.8 3.4* 3.9 3.2* 3.3*  CL 109   < > 109 111 109 111 109  CO2 22   < > 19* 21* _0 GLUCOSE 123*   < > 499* 188* 283* 180* 137*  BUN 16   < > 24* 25* _1 CREATININE 1.60*   < > 2.15* 1.98* 1.95* 1.66* 1.65*  CALCIUM 7.7*   < > 7.9* 8.2* 8.7* 8.8* 8.7*  MG 1.6*  --   --  2.3 2.2 1.9 1.8  PHOS 2.9  --   --  2.9 2.6 2.2* 3.1   < > = values in this  interval not displayed.   Liver Function Tests: Recent Labs  Lab 03/13/20 0642  AST 18  ALT 11  ALKPHOS 61  BILITOT 0.6  PROT 7.0  ALBUMIN 2.7*   No results for input(s): LIPASE, AMYLASE in the last 168 hours. Recent Labs  Lab 03/13/20 1025  AMMONIA 23   CBC: Recent Labs  Lab 03/14/20 0455 03/14/20 0455 03/14/20 1838 03/15/20 0507 03/16/20 0559 03/17/20 0226 03/18/20 0339  WBC 8.2  --   --  11.2* 11.3* 7.1 6.8  NEUTROABS 7.3  --   --  10.0* 9.8* 5.4 4.8  HGB 7.7*   < > 8.4* 7.6* 9.0* 8.3* 7.9*  HCT 25.0*   < > 28.3* 25.9* 30.5* 27.7* 26.5*  MCV 80.6  --   --  82.0 82.2 81.5 81.0  PLT 261  --   --  292 348 322 278   < > = values in this interval not displayed.   Cardiac Enzymes: No results for input(s): CKTOTAL, CKMB, CKMBINDEX, TROPONINI in the last 168 hours. BNP: BNP (last 3 results) Recent Labs    03/13/20 0642  BNP 419.0*    ProBNP (last 3 results) No results for input(s): PROBNP in the last 8760 hours.  CBG: Recent Labs  Lab 03/17/20 2000 03/18/20 0009 03/18/20 0408 03/18/20 0749 03/18/20 1136  GLUCAP 162* 138* 118* 129* 146*       Signed:  Alma Friendly, MD [facility] 03/18/2020, 12:33 PM

## 2020-03-18 NOTE — Progress Notes (Signed)
ANTICOAGULATION CONSULT NOTE - Follow Up Consult  Pharmacy Consult for apixaban  Indication: pulmonary embolus  No Known Allergies  Patient Measurements: Height: 5\' 10"  (177.8 cm) Weight: 86.1 kg (189 lb 13.1 oz) IBW/kg (Calculated) : 73 Heparin Dosing Weight: 92.5 kg  Vital Signs: BP: 146/60 (09/06 0407) Pulse Rate: 78 (09/06 0407)  Labs: Recent Labs    03/16/20 0559 03/16/20 0559 03/17/20 0226 03/18/20 0339  HGB 9.0*   < > 8.3* 7.9*  HCT 30.5*  --  27.7* 26.5*  PLT 348  --  322 278  CREATININE 1.95*  --  1.66* 1.65*   < > = values in this interval not displayed.    Estimated Creatinine Clearance: 46.7 mL/min (A) (by C-G formula based on SCr of 1.65 mg/dL (H)).   Medical History: Past Medical History:  Diagnosis Date  . Anemia   . Cancer [facility])    Multiple Myeloma  . Chronic kidney disease   . Diabetes mellitus without complication ([facility])   . Hypertension   . Pneumonia     Assessment: 64 yr old male presented with respiratory distress, multifocal pneumonia and subsegmental PE on CTA. Patient was previously on IV heparin and was transitioned to apixaban for anticoagulation.   H/H down (7.9). Plt 278. Scr elevated but downtrending at 1.65.  Plan:  Continue apixaban 10 mg twice daily x 7 days followed by apixaban 5 mg twice daily  Monitor renal function, signs/symptoms of bleeding, and CBC  Wilson Singer, PharmD PGY1 Pharmacy Resident 03/18/2020 8:11 AM

## 2020-03-18 NOTE — TOC Transition Note (Signed)
Transition of Care [facility]) - CM/SW Discharge Note   Patient Details  Name: GERRIT RAFALSKI MRN: [id number] Date of Birth: Sep 25, 1955  [facility] CM/SW Contact:  Bartholomew Crews, RN Phone Number: [phone] 03/18/2020, 12:21 PM   Clinical Narrative:     Spoke with patient and spouse at the bedside. PTA home with spouse. PCP is Dr. Jerilee Hoh at [facility]. Preferred pharmacy is [facility]. Currently on a cobra plan for insurance, but has signed up for Medicare which kicks in December when he turns 43.   Provided Eliquis card and advised to take to pharmacy when he picks up medication.   Spouse to provide transportation home.   No further TOC needs identified.   Final next level of care: Home/Self Care Barriers to Discharge: No Barriers Identified   Patient Goals and CMS Choice Patient states their goals for this hospitalization and ongoing recovery are:: home with wife CMS Medicare.gov Compare Post Acute Care list provided to:: Patient Choice offered to / list presented to : NA  Discharge Placement                       Discharge Plan and Services In-house Referral: NA Discharge Planning Services: CM Consult Post Acute Care Choice: NA          DME Arranged: N/A DME Agency: NA       HH Arranged: NA HH Agency: NA        Social Determinants of Health (SDOH) Interventions     Readmission Risk Interventions No flowsheet data found.

## 2020-03-18 NOTE — Plan of Care (Signed)
  Problem: Activity: Goal: Risk for activity intolerance will decrease Outcome: Progressing   Problem: Coping: Goal: Level of anxiety will decrease Outcome: Progressing   Problem: Safety: Goal: Ability to remain free from injury will improve Outcome: Progressing   

## 2020-03-18 NOTE — Progress Notes (Signed)
DISCHARGE NOTE HOME [name] to be discharged Home per MD order. Discussed prescriptions and follow up appointments with the patient. Prescriptions given to patient; medication list explained in detail. Patient verbalized understanding.  Skin clean, dry and intact without evidence of skin break down, no evidence of skin tears noted. IV catheter discontinued intact. Site without signs and symptoms of complications. Dressing and pressure applied. Pt denies pain at the site currently. No complaints noted.  Patient free of lines, drains, and wounds.   An After Visit Summary (AVS) was printed and given to the patient. Patient escorted via wheelchair, and discharged home via private auto.  [name], RN

## 2020-03-19 ENCOUNTER — Other Ambulatory Visit: Payer: Self-pay

## 2020-03-19 ENCOUNTER — Inpatient Hospital Stay: Payer: PRIVATE HEALTH INSURANCE | Attending: Hematology

## 2020-03-19 ENCOUNTER — Inpatient Hospital Stay: Payer: PRIVATE HEALTH INSURANCE

## 2020-03-19 VITALS — BP 143/62 | HR 70 | Temp 98.8°F | Resp 18

## 2020-03-19 DIAGNOSIS — I509 Heart failure, unspecified: Secondary | ICD-10-CM | POA: Diagnosis not present

## 2020-03-19 DIAGNOSIS — Z7982 Long term (current) use of aspirin: Secondary | ICD-10-CM | POA: Insufficient documentation

## 2020-03-19 DIAGNOSIS — D649 Anemia, unspecified: Secondary | ICD-10-CM | POA: Insufficient documentation

## 2020-03-19 DIAGNOSIS — Z7189 Other specified counseling: Secondary | ICD-10-CM

## 2020-03-19 DIAGNOSIS — Z79899 Other long term (current) drug therapy: Secondary | ICD-10-CM | POA: Diagnosis not present

## 2020-03-19 DIAGNOSIS — Z7901 Long term (current) use of anticoagulants: Secondary | ICD-10-CM | POA: Diagnosis not present

## 2020-03-19 DIAGNOSIS — Z5112 Encounter for antineoplastic immunotherapy: Secondary | ICD-10-CM | POA: Insufficient documentation

## 2020-03-19 DIAGNOSIS — E785 Hyperlipidemia, unspecified: Secondary | ICD-10-CM | POA: Diagnosis not present

## 2020-03-19 DIAGNOSIS — M1990 Unspecified osteoarthritis, unspecified site: Secondary | ICD-10-CM | POA: Diagnosis not present

## 2020-03-19 DIAGNOSIS — C9000 Multiple myeloma not having achieved remission: Secondary | ICD-10-CM

## 2020-03-19 DIAGNOSIS — I2510 Atherosclerotic heart disease of native coronary artery without angina pectoris: Secondary | ICD-10-CM | POA: Insufficient documentation

## 2020-03-19 DIAGNOSIS — R634 Abnormal weight loss: Secondary | ICD-10-CM | POA: Diagnosis not present

## 2020-03-19 DIAGNOSIS — Z86711 Personal history of pulmonary embolism: Secondary | ICD-10-CM | POA: Insufficient documentation

## 2020-03-19 DIAGNOSIS — I739 Peripheral vascular disease, unspecified: Secondary | ICD-10-CM | POA: Diagnosis not present

## 2020-03-19 DIAGNOSIS — E1122 Type 2 diabetes mellitus with diabetic chronic kidney disease: Secondary | ICD-10-CM | POA: Insufficient documentation

## 2020-03-19 DIAGNOSIS — I130 Hypertensive heart and chronic kidney disease with heart failure and stage 1 through stage 4 chronic kidney disease, or unspecified chronic kidney disease: Secondary | ICD-10-CM | POA: Diagnosis not present

## 2020-03-19 DIAGNOSIS — Z7952 Long term (current) use of systemic steroids: Secondary | ICD-10-CM | POA: Insufficient documentation

## 2020-03-19 DIAGNOSIS — Z791 Long term (current) use of non-steroidal anti-inflammatories (NSAID): Secondary | ICD-10-CM | POA: Insufficient documentation

## 2020-03-19 DIAGNOSIS — M25562 Pain in left knee: Secondary | ICD-10-CM | POA: Insufficient documentation

## 2020-03-19 LAB — CMP (CANCER CENTER ONLY)
ALT: 13 U/L (ref 0–44)
AST: 11 U/L — ABNORMAL LOW (ref 15–41)
Albumin: 2.7 g/dL — ABNORMAL LOW (ref 3.5–5.0)
Alkaline Phosphatase: 58 U/L (ref 38–126)
Anion gap: 10 (ref 5–15)
BUN: 13 mg/dL (ref 8–23)
CO2: 24 mmol/L (ref 22–32)
Calcium: 9 mg/dL (ref 8.9–10.3)
Chloride: 108 mmol/L (ref 98–111)
Creatinine: 1.5 mg/dL — ABNORMAL HIGH (ref 0.61–1.24)
GFR, Est AFR Am: 56 mL/min — ABNORMAL LOW (ref >60)
GFR, Estimated: 49 mL/min — ABNORMAL LOW (ref >60)
Glucose, Bld: 182 mg/dL — ABNORMAL HIGH (ref 70–99)
Potassium: 3.6 mmol/L (ref 3.5–5.1)
Sodium: 142 mmol/L (ref 135–145)
Total Bilirubin: 0.7 mg/dL (ref 0.3–1.2)
Total Protein: 6.5 g/dL (ref 6.5–8.1)

## 2020-03-19 LAB — CBC WITH DIFFERENTIAL/PLATELET
Abs Immature Granulocytes: 0.09 10*3/uL — ABNORMAL HIGH (ref 0.00–0.07)
Basophils Absolute: 0 10*3/uL (ref 0.0–0.1)
Basophils Relative: 0 %
Eosinophils Absolute: 0.1 10*3/uL (ref 0.0–0.5)
Eosinophils Relative: 1 %
HCT: 25.4 % — ABNORMAL LOW (ref 39.0–52.0)
Hemoglobin: 7.9 g/dL — ABNORMAL LOW (ref 13.0–17.0)
Immature Granulocytes: 1 %
Lymphocytes Relative: 15 %
Lymphs Abs: 1.1 10*3/uL (ref 0.7–4.0)
MCH: 24.9 pg — ABNORMAL LOW (ref 26.0–34.0)
MCHC: 31.1 g/dL (ref 30.0–36.0)
MCV: 80.1 fL (ref 80.0–100.0)
Monocytes Absolute: 0.5 10*3/uL (ref 0.1–1.0)
Monocytes Relative: 7 %
Neutro Abs: 5.9 10*3/uL (ref 1.7–7.7)
Neutrophils Relative %: 76 %
Platelets: 311 10*3/uL (ref 150–400)
RBC: 3.17 MIL/uL — ABNORMAL LOW (ref 4.22–5.81)
RDW: 19.4 % — ABNORMAL HIGH (ref 11.5–15.5)
WBC: 7.7 10*3/uL (ref 4.0–10.5)
nRBC: 0 % (ref 0.0–0.2)

## 2020-03-19 LAB — C-REACTIVE PROTEIN: CRP: 3.1 mg/dL — ABNORMAL HIGH (ref <1.0)

## 2020-03-19 LAB — URIC ACID: Uric Acid, Serum: 6 mg/dL (ref 3.7–8.6)

## 2020-03-19 LAB — SEDIMENTATION RATE: Sed Rate: 82 mm/hr — ABNORMAL HIGH (ref 0–16)

## 2020-03-19 MED ORDER — SODIUM CHLORIDE 0.9 % IV SOLN
16.0000 mg/kg | Freq: Once | INTRAVENOUS | Status: AC
  Administered 2020-03-19: 1300 mg via INTRAVENOUS
  Filled 2020-03-19: qty 60

## 2020-03-19 MED ORDER — MONTELUKAST SODIUM 10 MG PO TABS
ORAL_TABLET | ORAL | Status: AC
  Filled 2020-03-19: qty 1

## 2020-03-19 MED ORDER — MEPERIDINE HCL 25 MG/ML IJ SOLN
25.0000 mg | Freq: Once | INTRAMUSCULAR | Status: AC
  Administered 2020-03-19: 25 mg via INTRAVENOUS

## 2020-03-19 MED ORDER — DIPHENHYDRAMINE HCL 25 MG PO CAPS
50.0000 mg | ORAL_CAPSULE | Freq: Once | ORAL | Status: AC
  Administered 2020-03-19: 50 mg via ORAL

## 2020-03-19 MED ORDER — ACETAMINOPHEN 325 MG PO TABS
650.0000 mg | ORAL_TABLET | Freq: Once | ORAL | Status: AC
  Administered 2020-03-19: 650 mg via ORAL

## 2020-03-19 MED ORDER — SODIUM CHLORIDE 0.9 % IV SOLN
Freq: Once | INTRAVENOUS | Status: AC
  Filled 2020-03-19: qty 250

## 2020-03-19 MED ORDER — MONTELUKAST SODIUM 10 MG PO TABS
10.0000 mg | ORAL_TABLET | Freq: Once | ORAL | Status: AC
  Administered 2020-03-19: 10 mg via ORAL

## 2020-03-19 MED ORDER — MEPERIDINE HCL 25 MG/ML IJ SOLN
INTRAMUSCULAR | Status: AC
Start: 2020-03-19 — End: ?
  Filled 2020-03-19: qty 1

## 2020-03-19 MED ORDER — METHYLPREDNISOLONE SODIUM SUCC 125 MG IJ SOLR
INTRAMUSCULAR | Status: AC
  Filled 2020-03-19: qty 2

## 2020-03-19 MED ORDER — FAMOTIDINE IN NACL 20-0.9 MG/50ML-% IV SOLN
INTRAVENOUS | Status: AC
  Filled 2020-03-19: qty 50

## 2020-03-19 MED ORDER — SODIUM CHLORIDE 0.9 % IV SOLN
40.0000 mg | Freq: Once | INTRAVENOUS | Status: AC
  Administered 2020-03-19: 40 mg via INTRAVENOUS
  Filled 2020-03-19: qty 4

## 2020-03-19 MED ORDER — DIPHENHYDRAMINE HCL 25 MG PO CAPS
ORAL_CAPSULE | ORAL | Status: AC
  Filled 2020-03-19: qty 2

## 2020-03-19 MED ORDER — ACETAMINOPHEN 325 MG PO TABS
ORAL_TABLET | ORAL | Status: AC
  Filled 2020-03-19: qty 2

## 2020-03-19 MED ORDER — METHYLPREDNISOLONE SODIUM SUCC 125 MG IJ SOLR
100.0000 mg | Freq: Once | INTRAMUSCULAR | Status: AC
  Administered 2020-03-19: 100 mg via INTRAVENOUS

## 2020-03-19 NOTE — Patient Instructions (Signed)
[facility] Discharge Instructions for Patients Receiving Chemotherapy  Today you received the following chemotherapy agents Daratumumab (DARZALEX).  To help prevent nausea and vomiting after your treatment, we encourage you to take your nausea medication as prescribed.   If you develop nausea and vomiting that is not controlled by your nausea medication, call the clinic.   BELOW ARE SYMPTOMS THAT SHOULD BE REPORTED IMMEDIATELY:  *FEVER GREATER THAN 100.5 F  *CHILLS WITH OR WITHOUT FEVER  NAUSEA AND VOMITING THAT IS NOT CONTROLLED WITH YOUR NAUSEA MEDICATION  *UNUSUAL SHORTNESS OF BREATH  *UNUSUAL BRUISING OR BLEEDING  TENDERNESS IN MOUTH AND THROAT WITH OR WITHOUT PRESENCE OF ULCERS  *URINARY PROBLEMS  *BOWEL PROBLEMS  UNUSUAL RASH Items with * indicate a potential emergency and should be followed up as soon as possible.  Feel free to call the clinic should you have any questions or concerns. The clinic phone number is [phone].  Please show the CHEMO ALERT CARD at check-in to the Emergency Department and triage nurse.   

## 2020-03-19 NOTE — Progress Notes (Signed)
Per Dr. Irene Limbo OK to treat with Hemoglobin 7.9

## 2020-03-21 ENCOUNTER — Encounter: Payer: Self-pay | Admitting: Endocrinology

## 2020-03-21 LAB — MULTIPLE MYELOMA PANEL, SERUM
Albumin SerPl Elph-Mcnc: 2.7 g/dL — ABNORMAL LOW (ref 2.9–4.4)
Albumin/Glob SerPl: 0.9 (ref 0.7–1.7)
Alpha 1: 0.3 g/dL (ref 0.0–0.4)
Alpha2 Glob SerPl Elph-Mcnc: 1 g/dL (ref 0.4–1.0)
B-Globulin SerPl Elph-Mcnc: 0.8 g/dL (ref 0.7–1.3)
Gamma Glob SerPl Elph-Mcnc: 1 g/dL (ref 0.4–1.8)
Globulin, Total: 3.1 g/dL (ref 2.2–3.9)
IgA: 57 mg/dL — ABNORMAL LOW (ref 61–437)
IgG (Immunoglobin G), Serum: 1079 mg/dL (ref 603–1613)
IgM (Immunoglobulin M), Srm: 26 mg/dL (ref 20–172)
M Protein SerPl Elph-Mcnc: 0.4 g/dL — ABNORMAL HIGH
Total Protein ELP: 5.8 g/dL — ABNORMAL LOW (ref 6.0–8.5)

## 2020-04-02 ENCOUNTER — Other Ambulatory Visit: Payer: No Typology Code available for payment source

## 2020-04-02 ENCOUNTER — Inpatient Hospital Stay: Payer: PRIVATE HEALTH INSURANCE

## 2020-04-02 ENCOUNTER — Inpatient Hospital Stay (HOSPITAL_BASED_OUTPATIENT_CLINIC_OR_DEPARTMENT_OTHER): Payer: PRIVATE HEALTH INSURANCE | Admitting: Hematology

## 2020-04-02 ENCOUNTER — Other Ambulatory Visit: Payer: Self-pay

## 2020-04-02 VITALS — BP 136/70 | HR 77 | Temp 98.7°F | Resp 18 | Ht 70.0 in | Wt 177.0 lb

## 2020-04-02 VITALS — BP 146/56 | HR 72 | Temp 98.2°F | Resp 16

## 2020-04-02 DIAGNOSIS — D649 Anemia, unspecified: Secondary | ICD-10-CM

## 2020-04-02 DIAGNOSIS — Z7189 Other specified counseling: Secondary | ICD-10-CM

## 2020-04-02 DIAGNOSIS — Z5112 Encounter for antineoplastic immunotherapy: Secondary | ICD-10-CM

## 2020-04-02 DIAGNOSIS — C9000 Multiple myeloma not having achieved remission: Secondary | ICD-10-CM

## 2020-04-02 LAB — CMP (CANCER CENTER ONLY)
ALT: 9 U/L (ref 0–44)
AST: 9 U/L — ABNORMAL LOW (ref 15–41)
Albumin: 3.1 g/dL — ABNORMAL LOW (ref 3.5–5.0)
Alkaline Phosphatase: 87 U/L (ref 38–126)
Anion gap: 7 (ref 5–15)
BUN: 14 mg/dL (ref 8–23)
CO2: 24 mmol/L (ref 22–32)
Calcium: 9.1 mg/dL (ref 8.9–10.3)
Chloride: 110 mmol/L (ref 98–111)
Creatinine: 1.51 mg/dL — ABNORMAL HIGH (ref 0.61–1.24)
GFR, Est AFR Am: 56 mL/min — ABNORMAL LOW (ref >60)
GFR, Estimated: 48 mL/min — ABNORMAL LOW (ref >60)
Glucose, Bld: 192 mg/dL — ABNORMAL HIGH (ref 70–99)
Potassium: 3.4 mmol/L — ABNORMAL LOW (ref 3.5–5.1)
Sodium: 141 mmol/L (ref 135–145)
Total Bilirubin: 0.7 mg/dL (ref 0.3–1.2)
Total Protein: 6.6 g/dL (ref 6.5–8.1)

## 2020-04-02 LAB — CBC WITH DIFFERENTIAL/PLATELET
Abs Immature Granulocytes: 0.02 10*3/uL (ref 0.00–0.07)
Basophils Absolute: 0 10*3/uL (ref 0.0–0.1)
Basophils Relative: 0 %
Eosinophils Absolute: 0.1 10*3/uL (ref 0.0–0.5)
Eosinophils Relative: 1 %
HCT: 28.6 % — ABNORMAL LOW (ref 39.0–52.0)
Hemoglobin: 8.9 g/dL — ABNORMAL LOW (ref 13.0–17.0)
Immature Granulocytes: 0 %
Lymphocytes Relative: 19 %
Lymphs Abs: 1.3 10*3/uL (ref 0.7–4.0)
MCH: 25.6 pg — ABNORMAL LOW (ref 26.0–34.0)
MCHC: 31.1 g/dL (ref 30.0–36.0)
MCV: 82.2 fL (ref 80.0–100.0)
Monocytes Absolute: 0.6 10*3/uL (ref 0.1–1.0)
Monocytes Relative: 9 %
Neutro Abs: 4.8 10*3/uL (ref 1.7–7.7)
Neutrophils Relative %: 71 %
Platelets: 241 10*3/uL (ref 150–400)
RBC: 3.48 MIL/uL — ABNORMAL LOW (ref 4.22–5.81)
RDW: 20.9 % — ABNORMAL HIGH (ref 11.5–15.5)
WBC: 6.8 10*3/uL (ref 4.0–10.5)
nRBC: 0 % (ref 0.0–0.2)

## 2020-04-02 MED ORDER — MEPERIDINE HCL 25 MG/ML IJ SOLN
25.0000 mg | Freq: Once | INTRAMUSCULAR | Status: AC
  Administered 2020-04-02: 25 mg via INTRAVENOUS

## 2020-04-02 MED ORDER — ACETAMINOPHEN 325 MG PO TABS
ORAL_TABLET | ORAL | Status: AC
  Filled 2020-04-02: qty 2

## 2020-04-02 MED ORDER — SODIUM CHLORIDE 0.9 % IV SOLN
40.0000 mg | Freq: Once | INTRAVENOUS | Status: AC
  Administered 2020-04-02: 40 mg via INTRAVENOUS
  Filled 2020-04-02: qty 4

## 2020-04-02 MED ORDER — METHYLPREDNISOLONE SODIUM SUCC 125 MG IJ SOLR
INTRAMUSCULAR | Status: AC
  Filled 2020-04-02: qty 2

## 2020-04-02 MED ORDER — MONTELUKAST SODIUM 10 MG PO TABS
ORAL_TABLET | ORAL | Status: AC
  Filled 2020-04-02: qty 1

## 2020-04-02 MED ORDER — SODIUM CHLORIDE 0.9 % IV SOLN
16.0000 mg/kg | Freq: Once | INTRAVENOUS | Status: AC
  Administered 2020-04-02: 1300 mg via INTRAVENOUS
  Filled 2020-04-02: qty 60

## 2020-04-02 MED ORDER — SODIUM CHLORIDE 0.9 % IV SOLN
Freq: Once | INTRAVENOUS | Status: AC
  Filled 2020-04-02: qty 250

## 2020-04-02 MED ORDER — DIPHENHYDRAMINE HCL 25 MG PO CAPS
50.0000 mg | ORAL_CAPSULE | Freq: Once | ORAL | Status: AC
  Administered 2020-04-02: 50 mg via ORAL

## 2020-04-02 MED ORDER — ACETAMINOPHEN 325 MG PO TABS
650.0000 mg | ORAL_TABLET | Freq: Once | ORAL | Status: AC
  Administered 2020-04-02: 650 mg via ORAL

## 2020-04-02 MED ORDER — DIPHENHYDRAMINE HCL 25 MG PO CAPS
ORAL_CAPSULE | ORAL | Status: AC
  Filled 2020-04-02: qty 2

## 2020-04-02 MED ORDER — MONTELUKAST SODIUM 10 MG PO TABS
10.0000 mg | ORAL_TABLET | Freq: Once | ORAL | Status: AC
  Administered 2020-04-02: 10 mg via ORAL

## 2020-04-02 MED ORDER — MEPERIDINE HCL 25 MG/ML IJ SOLN
INTRAMUSCULAR | Status: AC
  Filled 2020-04-02: qty 1

## 2020-04-02 MED ORDER — METHYLPREDNISOLONE SODIUM SUCC 125 MG IJ SOLR
100.0000 mg | Freq: Once | INTRAMUSCULAR | Status: AC
  Administered 2020-04-02: 100 mg via INTRAVENOUS

## 2020-04-02 NOTE — Patient Instructions (Signed)
[facility] Discharge Instructions for Patients Receiving Chemotherapy  Today you received the following chemotherapy agents Daratumumab (DARZALEX).  To help prevent nausea and vomiting after your treatment, we encourage you to take your nausea medication as prescribed.   If you develop nausea and vomiting that is not controlled by your nausea medication, call the clinic.   BELOW ARE SYMPTOMS THAT SHOULD BE REPORTED IMMEDIATELY:  *FEVER GREATER THAN 100.5 F  *CHILLS WITH OR WITHOUT FEVER  NAUSEA AND VOMITING THAT IS NOT CONTROLLED WITH YOUR NAUSEA MEDICATION  *UNUSUAL SHORTNESS OF BREATH  *UNUSUAL BRUISING OR BLEEDING  TENDERNESS IN MOUTH AND THROAT WITH OR WITHOUT PRESENCE OF ULCERS  *URINARY PROBLEMS  *BOWEL PROBLEMS  UNUSUAL RASH Items with * indicate a potential emergency and should be followed up as soon as possible.  Feel free to call the clinic should you have any questions or concerns. The clinic phone number is [phone].  Please show the CHEMO ALERT CARD at check-in to the Emergency Department and triage nurse.   

## 2020-04-02 NOTE — Progress Notes (Signed)
HEMATOLOGY/ONCOLOGY CLINIC NOTE  Date of Service: 04/02/2020  Patient Care Team: Isaac Bliss, Rayford Halsted, MD as PCP - General (Internal Medicine) Martinique, Peter M, MD as PCP - Cardiology (Cardiology) Isaac Bliss, Rayford Halsted, MD (Internal Medicine)  CHIEF COMPLAINTS/PURPOSE OF CONSULTATION:  Recently diagnosed myeloma  HISTORY OF PRESENTING ILLNESS:  [name] is a wonderful 64 y.o. male who has been referred to [place] by Dr Servando Snare for evaluation and management of elevated protein. Pt is accompanied today by his wife, [name]. The pt reports that he is doing well overall.    The pt reports he was first told that he had Diabetes two years ago. He has not previously needed to be on medications for his diabetes but has recently been started on Januvia by Dr. Elayne Snare, his Endocrinologist. His blood glucose was 149 this morning. He sees Burtis Junes - NP and Dr. Martinique for Cardiology. Pt has CAD and had to have open heart surgery 20 years ago. He also has HTN that he feels has been stable. His Cardiology team is currently managing his heart medications and recently told pt to discontinue taking Furosemide due kidney function on last labs. Dr. Martinique and Truitt Merle have been essentially functioning as his primary care, as he does not have a PCP at this time. He has an upcoming appointment with Truitt Merle in the beginning of May and Dr. [name] next Tuesday. Pt is currently using Ibuprofen a couple of times per week for his knee pain.   Pt has felt the same in the last 3-6 months and denies any new bone pain of any kinds. He is eating, functioning, and moving about as normal. He has lost about 13 lbs over the last few months. Pt does not smoke and does not drink much alcohol.    Most recent lab results (09/04/2019) of CBC, BMP and Hepatic function panel is as follows: all values are WNL except for RBC at 3.64, Hgb at 9.8, HCT at 29.8, Glucose at 129, BUN at 64, Creatinine  at 2.62, GFR Est Af Am at 29, CO2 at 15, Total Protein at 10.0, Albumin at 3.6, ALP at 163, ALT at 45.    On review of systems, pt reports unexpected weight loss, left knee pain and denies new back pain, new shoulder pain, new hip pain, chest pain, SOB, leg swelling, testicular pain/swelling and any other symptoms.    On PMHx the pt reports Left Knee Arthritis, CAD, CHF, Type II Diabetes, HTN, HLD, Cardiac Catheterization, Coronary Artery Bypass Graft x4 (2002). On Social Hx the pt reports that he is a non-smoker and does not drink much alcohol.  INTERVAL HISTORY:  [name] is a wonderful 64 y.o. male is here for evaluation and management of newly diagnosed myeloma. He is here for C6D1 Daratumumab. The patient's last visit with [place] was on 03/05/2020. The pt reports that he is doing well overall.  The pt reports that his blood sugars were not low prior to his hospital admission on 03/13/2020. Upon arrival to the ED pt was found to have hypoglycemia leading to altered mental status. He had a CT Chest which revealed a small pulmonary embolism. Pt was placed on antibiotics for pneumonia prevention and Eliquis. He believes that his blood sugars have been steady since discharge. He denies any shortness of breath, coughing, or any other respiratory symptoms. Pt also denies any bleeding or bruising concerns.   He notes improved right arm swelling, but  has seen an increase in his left arm swelling. Pt has not been able to schedule an appointment with Dr. Estanislado Pandy any sooner than January.   Of note since the patient's last visit, pt has had CT Angio Chest ([id number]) completed on 03/13/2020 with results revealing "On image 148 of series 4 there is a subtle filling defect in subsegmental branches to the lingula also seen on image 117 of series 7 compatible with small peripheral, subsegmental pulmonary embolus. This represents a change from the initial report."  Lab results today (04/02/20) of CBC  w/diff and CMP is as follows: all values are WNL except for RBC at 3.48, Hgb at 8.9, HCT at 28.6, MCH at 25.6, RDW at 20.9, Potassium at 3.4, Glucose at 192, Creatinine at 1.51, Albumin at 3.1, AST at 9, GFR Est Afr Am at 56.  On review of systems, pt reports arm swelling and denies leg swelling, SOB, cough, mouth sores, fevers, chills, abdominal pain, new back pain, chest pain, abnormal/excessive bleeding and any other symptoms.   MEDICAL HISTORY:  Past Medical History:  Diagnosis Date  . Anemia   . Arthritis    "left knee" (11/24/2017)  . CAD (coronary artery disease)    CABG 2002  . Cancer [facility])    Multiple Myeloma  . CHF (congestive heart failure) ([place])   . Chronic kidney disease   . Chronic renal insufficiency   . Diabetes mellitus without complication ([place])   . Dilated cardiomyopathy ([place])    echo in 2009 showed improvement with a normal EF  . HTN (hypertension)   . Hyperlipemia   . Hypertension   . Noncompliance   . PAD (peripheral artery disease) ([place])   . Pneumonia     SURGICAL HISTORY: Past Surgical History:  Procedure Laterality Date  . CARDIAC CATHETERIZATION  2002, 2006  . CORONARY ARTERY BYPASS GRAFT  2002   x4 DR. VAN TRIGT  . IR FLUORO GUIDED NEEDLE PLC ASPIRATION/INJECTION LOC  10/24/2019    SOCIAL HISTORY: Social History   Socioeconomic History  . Marital status: Married    Spouse name: Not on file  . Number of children: 1  . Years of education: Not on file  . Highest education level: Not on file  Occupational History  . Occupation: Furniture conservator/restorer  Tobacco Use  . Smoking status: Never Smoker  . Smokeless tobacco: Never Used  Vaping Use  . Vaping Use: Never used  Substance and Sexual Activity  . Alcohol use: Not Currently  . Drug use: Not Currently  . Sexual activity: Yes  Other Topics Concern  . Not on file  Social History Narrative   ** Merged History Encounter **       Social Determinants of Health   Financial Resource Strain:   .  Difficulty of Paying Living Expenses: Not on file  Food Insecurity:   . Worried About Charity fundraiser in the Last Year: Not on file  . Ran Out of Food in the Last Year: Not on file  Transportation Needs:   . Lack of Transportation (Medical): Not on file  . Lack of Transportation (Non-Medical): Not on file  Physical Activity:   . Days of Exercise per Week: Not on file  . Minutes of Exercise per Session: Not on file  Stress:   . Feeling of Stress : Not on file  Social Connections:   . Frequency of Communication with Friends and Family: Not on file  . Frequency of Social Gatherings with Friends and Family:  Not on file  . Attends Religious Services: Not on file  . Active Member of Clubs or Organizations: Not on file  . Attends Archivist Meetings: Not on file  . Marital Status: Not on file  Intimate Partner Violence:   . Fear of Current or Ex-Partner: Not on file  . Emotionally Abused: Not on file  . Physically Abused: Not on file  . Sexually Abused: Not on file    FAMILY HISTORY: Family History  Problem Relation Age of Onset  . Hypertension Father   . Diabetes Mother        DIABETIC COMA    ALLERGIES:  has No Known Allergies.  MEDICATIONS:  Current Outpatient Medications  Medication Sig Dispense Refill  . Accu-Chek Softclix Lancets lancets Use Accu Chek softclix lancets to check blood sugar fasting or 2 hours after a meal every other day. 100 each 2  . acyclovir (ZOVIRAX) 400 MG tablet Take 0.5 tablets (200 mg total) by mouth 2 (two) times daily. 60 tablet 11  . acyclovir (ZOVIRAX) 400 MG tablet Take 400 mg by mouth in the morning and at bedtime.    [name] amLODipine (NORVASC) 5 MG tablet Take 1 tablet (5 mg total) by mouth daily. 90 tablet 3  . amLODipine (NORVASC) 5 MG tablet Take 5 mg by mouth daily.    [name] apixaban (ELIQUIS) 5 MG TABS tablet Take 2 tablets ($RemoveBe'10mg'JHDunQLAV$ ) twice daily for 4 days, then 1 tablet ($RemoveB'5mg'CJwMHPII$ ) twice daily 60 tablet 0  . aspirin 325 MG EC tablet Take  81 mg by mouth daily.   (Patient not taking: Reported on 03/19/2020)    . aspirin EC 81 MG tablet Take 81 mg by mouth daily. Swallow whole.    [name] atorvastatin (LIPITOR) 80 MG tablet Take 1 tablet (80 mg total) by mouth daily. 90 tablet 3  . atorvastatin (LIPITOR) 80 MG tablet Take 80 mg by mouth daily.    . Blood Glucose Monitoring Suppl (ACCU-CHEK GUIDE ME) w/Device KIT 1 each by Does not apply route.    . carvedilol (COREG) 25 MG tablet TAKE 1 TABLET BY MOUTH TWICE DAILY WITH MEALS 180 tablet 3  . carvedilol (COREG) 25 MG tablet Take 25 mg by mouth 2 (two) times daily with a meal.    . dexamethasone (DECADRON) 4 MG tablet 3 tabs ($Remov'12mg'yVyOej$ ) orally with breakfast the day after each Daratumumab treatment. 60 tablet 2  . dexamethasone (DECADRON) 4 MG tablet Take 12 mg by mouth See admin instructions. Take after chemo treatments    . glipiZIDE (GLUCOTROL XL) 2.5 MG 24 hr tablet Take 1 tablet daily except take 2 tablets daily from Tuesday through Thursday when getting chemotherapy (Patient not taking: Reported on 03/19/2020) 45 tablet 3  . glucose blood (ACCU-CHEK GUIDE) test strip Use Accu Chek test strips as instructed to check blood sugar fasting or two hours after meals, every other day. 100 each 2  . hydrALAZINE (APRESOLINE) 50 MG tablet TAKE 1 TABLET BY MOUTH THREE TIMES DAILY 270 tablet 2  . hydrALAZINE (APRESOLINE) 50 MG tablet Take 50 mg by mouth 3 (three) times daily.    [name] ibuprofen (ADVIL,MOTRIN) 200 MG tablet Take 200 mg by mouth every 6 (six) hours as needed for moderate pain (for knee).    . isosorbide mononitrate (IMDUR) 30 MG 24 hr tablet Take 1 tablet by mouth once daily 90 tablet 1  . isosorbide mononitrate (IMDUR) 30 MG 24 hr tablet Take 30 mg by mouth daily.    [name]  losartan (COZAAR) 100 MG tablet Take 1 tablet by mouth once daily 90 tablet 1  . metFORMIN (GLUCOPHAGE) 500 MG tablet Take 1,000 mg by mouth daily with breakfast.    . metFORMIN (GLUCOPHAGE-XR) 500 MG 24 hr tablet Take 2 tablets  (1,000 mg total) by mouth daily with supper. 60 tablet 3  . ondansetron (ZOFRAN) 8 MG tablet Take 1 tablet (8 mg total) by mouth 2 (two) times daily as needed (Nausea or vomiting). 30 tablet 1  . ondansetron (ZOFRAN) 8 MG tablet Take 8 mg by mouth every 8 (eight) hours as needed for nausea or vomiting.    . predniSONE (DELTASONE) 20 MG tablet Take 1 tablet once daily for 5 days. (Patient not taking: Reported on 03/12/2020) 5 tablet 0  . predniSONE (DELTASONE) 20 MG tablet Take 20 mg by mouth daily as needed (arthiritis).    . prochlorperazine (COMPAZINE) 10 MG tablet Take 1 tablet (10 mg total) by mouth every 6 (six) hours as needed (Nausea or vomiting). 30 tablet 1  . prochlorperazine (COMPAZINE) 10 MG tablet Take 10 mg by mouth every 6 (six) hours as needed for nausea or vomiting.     No current facility-administered medications for this visit.    REVIEW OF SYSTEMS:  A 10+ POINT REVIEW OF SYSTEMS WAS OBTAINED including neurology, dermatology, psychiatry, cardiac, respiratory, lymph, extremities, GI, GU, Musculoskeletal, constitutional, breasts, reproductive, HEENT.  All pertinent positives are noted in the HPI.  All others are negative.   PHYSICAL EXAMINATION: ECOG PERFORMANCE STATUS: 0 - Asymptomatic  VS reviewed - stable  Exam was given in a chair   GENERAL:alert, in no acute distress and comfortable SKIN: no acute rashes, no significant lesions EYES: conjunctiva are pink and non-injected, sclera anicteric OROPHARYNX: MMM, no exudates, no oropharyngeal erythema or ulceration NECK: supple, no JVD LYMPH:  no palpable lymphadenopathy in the cervical, axillary or inguinal regions LUNGS: clear to auscultation b/l with normal respiratory effort HEART: regular rate & rhythm ABDOMEN:  normoactive bowel sounds , non tender, not distended. No palpable hepatosplenomegaly.  Extremity: no pedal edema PSYCH: alert & oriented x 3 with fluent speech NEURO: no focal motor/sensory  deficits  LABORATORY DATA:  I have reviewed the data as listed  . CBC Latest Ref Rng & Units 04/02/2020 03/19/2020 03/18/2020  WBC 4.0 - 10.5 K/uL 6.8 7.7 6.8  Hemoglobin 13.0 - 17.0 g/dL 8.9(L) 7.9(L) 7.9(L)  Hematocrit 39 - 52 % 28.6(L) 25.4(L) 26.5(L)  Platelets 150 - 400 K/uL 241 311 278    . CMP Latest Ref Rng & Units 04/02/2020 03/19/2020 03/18/2020  Glucose 70 - 99 mg/dL 192(H) 182(H) 137(H)  BUN 8 - 23 mg/dL $Remove'14 13 13  'iVNHslf$ Creatinine 0.61 - 1.24 mg/dL 1.51(H) 1.50(H) 1.65(H)  Sodium 135 - 145 mmol/L 141 142 142  Potassium 3.5 - 5.1 mmol/L 3.4(L) 3.6 3.3(L)  Chloride 98 - 111 mmol/L 110 108 109  CO2 22 - 32 mmol/L $RemoveB'24 24 23  'MiFmyFaS$ Calcium 8.9 - 10.3 mg/dL 9.1 9.0 8.7(L)  Total Protein 6.5 - 8.1 g/dL 6.6 6.5 -  Total Bilirubin 0.3 - 1.2 mg/dL 0.7 0.7 -  Alkaline Phos 38 - 126 U/L 87 58 -  AST 15 - 41 U/L 9(L) 11(L) -  ALT 0 - 44 U/L 9 13 -   10/24/2019 FISH Panel:    10/24/2019 BM Bx Surgical Pathology:    RADIOGRAPHIC STUDIES: I have personally reviewed the radiological images as listed and agreed with the findings in the report. DG Abdomen 1  View  Result Date: 03/13/2020 CLINICAL DATA:  Orogastric tube placement EXAM: ABDOMEN - 1 VIEW COMPARISON:  None. FINDINGS: Limited AP semi upright radiograph of the upper abdomen was obtained for the purposes of enteric tube localization. A feeding tube is seen coursing below the diaphragm with distal tip terminating in the expected location of the distal stomach proximal duodenum. There is marked dilation of the bilateral renal collecting systems which have excreted contrast from previous contrasted CT. Ill-defined collection of air projects over the peripheral aspect of the right hemiabdomen, which may represent air and stool within bowel. IMPRESSION: 1. Feeding tube tip terminates in the expected location of the distal stomach/proximal duodenum. 2. Severe bilateral hydroureteronephrosis. CT of the abdomen and pelvis is recommended to evaluate for  source of obstructive uropathy. 3. Ill-defined collection of air projects over the peripheral aspect of the right hemiabdomen, which may represent air and stool within bowel. This can also be further evaluated with previously recommended CT. Electronically Signed   By: Davina Poke D.O.   On: 03/13/2020 14:12   CT HEAD WO CONTRAST  Result Date: 03/13/2020 CLINICAL DATA:  Neuro deficit. Unresponsive, found down now intubated. EXAM: CT HEAD WITHOUT CONTRAST TECHNIQUE: Contiguous axial images were obtained from the base of the skull through the vertex without intravenous contrast. COMPARISON:  None FINDINGS: Brain: No evidence of acute infarction, hemorrhage, hydrocephalus, extra-axial collection or mass lesion/mass effect. 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 Vascular: No hyperdense vessel or unexpected calcification. Skull: Normal. Negative for fracture or focal lesion. Sinuses/Orbits: Mild mucosal thickening of the RIGHT maxillary sinus. Fluid in the nasopharynx likely related to endotracheal tube. Scattered ethmoid opacification as well. Other: None IMPRESSION: 1. No acute intracranial abnormality. 2. 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 Electronically Signed   By: Zetta Bills M.D.   On: 03/13/2020 10:31   CT Angio Chest PE W/Cm &/Or Wo Cm  Addendum Date: 03/13/2020   ADDENDUM REPORT: 03/13/2020 12:02 ADDENDUM: On image 148 of series 4 there is a subtle filling defect in subsegmental branches to the lingula also seen on image 117 of series 7 compatible with small peripheral, subsegmental pulmonary embolus. This represents a change from the initial report. These results were called by telephone at the time of interpretation on 03/13/2020 at 12:02 pm to provider [facility] , who verbally acknowledged these results. Electronically Signed   By: Zetta Bills M.D.   On:  03/13/2020 12:02   Result Date: 03/13/2020 CLINICAL DATA:  PE suspected. EXAM: CT ANGIOGRAPHY CHEST WITH CONTRAST TECHNIQUE: Multidetector CT imaging of the chest was performed using the standard protocol during bolus administration of intravenous contrast. Multiplanar CT image reconstructions and MIPs were obtained to evaluate the vascular anatomy. CONTRAST:  125mL OMNIPAQUE IOHEXOL 350 MG/ML SOLN COMPARISON:  Chest x-ray of the same date. Previous chest CT from 2006. FINDINGS: Cardiovascular: Study quality is adequate. No sign of pulmonary embolism. Post median sternotomy for CABG with three-vessel native coronary artery disease. Cardiac enlargement with LEFT ventricular hypertrophy without pericardial effusion. Mediastinum/Nodes: Endotracheal tube terminates in the mid trachea as seen on the recent chest x-ray. Esophagus mildly patulous. No thoracic inlet adenopathy. No axillary adenopathy. Lungs/Pleura: Basilar airspace consolidation and patchy areas of ground-glass attenuation. Ground-glass changes are worse in the RIGHT chest. Consolidative changes are seen dependently in the lung bases with patent airways, large airways. Peripheral airways are fluid-filled. Hypoenhancement of parenchyma  is seen bilaterally. No pleural effusion. Discrete ground-glass nodules are present, greater than 10 in the RIGHT upper lobe, example is demonstrated on image 27 of series 5 with a 9 mm ground-glass nodule. While there are ground-glass changes in the lingula. There are no areas of nodularity in the LEFT chest. Upper Abdomen: Incidental imaging of upper abdominal contents is unremarkable for acute process with very limited coverage. Musculoskeletal: Mild body wall edema. Degenerative changes of the spine. No acute musculoskeletal process. Signs of median sternotomy. Review of the MIP images confirms the above findings. IMPRESSION: 1. Negative for pulmonary embolism. 2. Basilar airspace consolidation bilaterally slightly worse  on the RIGHT, associated with areas of ground-glass opacity compatible with multifocal pneumonia. 3. Some of these ground-glass areas are more nodular and discrete particularly in the RIGHT upper lobe. Suggest follow-up CT in 3 months to ensure resolution. 4. Cardiac enlargement with LEFT ventricular hypertrophy. Post median sternotomy for CABG with three-vessel native coronary artery disease. 5. Endotracheal tube terminates in the mid as seen on the recent chest x-ray. 6. Mild body wall edema. 7. Aortic atherosclerosis. Aortic Atherosclerosis (ICD10-I70.0). Electronically Signed: By: Zetta Bills M.D. On: 03/13/2020 10:44   DG Chest Port 1 View  Result Date: 03/14/2020 CLINICAL DATA:  Shortness of breath and respiratory distress EXAM: PORTABLE CHEST 1 VIEW COMPARISON:  CT March 13, 2020 FINDINGS: The heart size and mediastinal contours are mildly enlarged. Aortic knob calcifications are seen. Overlying median sternotomy wires are present. Hazy patchy airspace opacities are seen within the right mid and lower lungs there is also left perihilar airspace opacities. There is slight interval improvement from the prior exam. IMPRESSION: Slight interval improvement in the multifocal bilateral airspace opacities. Electronically Signed   By: Prudencio Pair M.D.   On: 03/14/2020 02:09   DG Chest Port 1 View  Result Date: 03/13/2020 CLINICAL DATA:  Endotracheal tube placement. EXAM: PORTABLE CHEST 1 VIEW COMPARISON:  11/25/2017 FINDINGS: 0706 hours. Endotracheal tube tip is approximately 3.6 cm above the base of the carina. The cardio pericardial silhouette is enlarged. Low lung volumes with focal airspace opacity in the right mid lung and retrocardiac left base. Possible tiny left pleural effusion. The visualized bony structures of the thorax show no acute abnormality. Telemetry leads overlie the chest. IMPRESSION: 1. Endotracheal tube tip approximately 3.6 cm above the base of the carina. 2. Low lung volumes with  focal airspace disease in the right mid lung and retrocardiac left base. Imaging features suspicious for multifocal pneumonia. Electronically Signed   By: Misty Stanley M.D.   On: 03/13/2020 07:27   EEG adult  Result Date: 03/13/2020 Lora Havens, MD     03/13/2020 10:08 AM Patient Name: TREVELLE MCGURN MRN: [id number] Epilepsy Attending: Lora Havens Referring Physician/Provider: Dr. Kathrynn Speed Date: 03/13/2020 Duration: 24.11 mins Patient history: 64 year old male with decreased responsiveness in the setting of hypoxia, tachycardia, hypertension. EEG to evaluate for seizure Level of alertness: comatose AEDs during EEG study: Propofol Technical aspects: This EEG study was done with scalp electrodes positioned according to the 10-20 International system of electrode placement. Electrical activity was acquired at a sampling rate of 500Hz  and reviewed with a high frequency filter of 70Hz  and a low frequency filter of 1Hz . EEG data were recorded continuously and digitally stored. Description: EEG showed continuous generalized 2-3Hz  delta slowing with overriding 15-18hz  generalized beta activity. Hyperventilation and photic stimulation were not performed.   ABNORMALITY -Continuous slow, generalized - Excessive beta, generalized IMPRESSION: This study  is suggestive of severe diffuse encephalopathy, nonspecific etiology but could be related to sedation. No seizures or epileptiform discharges were seen throughout the recording. Lora Havens   ECHOCARDIOGRAM COMPLETE  Result Date: 03/14/2020    ECHOCARDIOGRAM REPORT   Patient Name:   SEBERT STOLLINGS Date of Exam: 03/14/2020 Medical Rec #:  [id number]         Height:       70.0 in Accession #:    [id number]        Weight:       187.6 lb Date of Birth:  1956-02-02        BSA:          2.031 m Patient Age:    37 years          BP:           125/58 mmHg Patient Gender: M                 HR:           74 bpm. Exam Location:  Inpatient Procedure: 2D Echo  Indications:    Pulmonary Embolus I26.99  History:        Patient has no prior history of Echocardiogram examinations.  Sonographer:    Mikki Santee RDCS (AE) Referring Phys: [place]  1. Left ventricular ejection fraction, by estimation, is 55 to 60%. The left ventricle has normal function. The left ventricle has no regional wall motion abnormalities. Left ventricular diastolic parameters were normal.  2. Right ventricular systolic function is normal. The right ventricular size is normal.  3. Left atrial size was mildly dilated.  4. The mitral valve is normal in structure. Trivial mitral valve regurgitation. No evidence of mitral stenosis.  5. The aortic valve is normal in structure. Aortic valve regurgitation is trivial. No aortic stenosis is present.  6. The inferior vena cava is normal in size with greater than 50% respiratory variability, suggesting right atrial pressure of 3 mmHg. FINDINGS  Left Ventricle: Left ventricular ejection fraction, by estimation, is 55 to 60%. The left ventricle has normal function. The left ventricle has no regional wall motion abnormalities. The left ventricular internal cavity size was normal in size. There is  no left ventricular hypertrophy. Left ventricular diastolic parameters were normal. Right Ventricle: The right ventricular size is normal. No increase in right ventricular wall thickness. Right ventricular systolic function is normal. Left Atrium: Left atrial size was mildly dilated. Right Atrium: Right atrial size was normal in size. Pericardium: Trivial pericardial effusion is present. The pericardial effusion is lateral to the left ventricle. Mitral Valve: The mitral valve is normal in structure. Normal mobility of the mitral valve leaflets. Trivial mitral valve regurgitation. No evidence of mitral valve stenosis. Tricuspid Valve: The tricuspid valve is normal in structure. Tricuspid valve regurgitation is trivial. No evidence of tricuspid  stenosis. Aortic Valve: The aortic valve is normal in structure. Aortic valve regurgitation is trivial. No aortic stenosis is present. Pulmonic Valve: The pulmonic valve was normal in structure. Pulmonic valve regurgitation is mild. No evidence of pulmonic stenosis. Aorta: The aortic root is normal in size and structure. Venous: The inferior vena cava is normal in size with greater than 50% respiratory variability, suggesting right atrial pressure of 3 mmHg. IAS/Shunts: No atrial level shunt detected by color flow Doppler.  LEFT VENTRICLE PLAX 2D LVIDd:         5.30 cm  Diastology LVIDs:  3.70 cm  LV e' lateral:   5.03 cm/s LV PW:         1.00 cm  LV E/e' lateral: 26.2 LV IVS:        1.10 cm  LV e' medial:    5.68 cm/s LVOT diam:     2.20 cm  LV E/e' medial:  23.2 LV SV:         71 LV SV Index:   35 LVOT Area:     3.80 cm  RIGHT VENTRICLE RV S prime:     10.80 cm/s TAPSE (M-mode): 2.0 cm LEFT ATRIUM           Index       RIGHT ATRIUM           Index LA diam:      4.30 cm 2.12 cm/m  RA Area:     22.10 cm LA Vol (A2C): 64.3 ml 31.65 ml/m RA Volume:   61.60 ml  30.32 ml/m LA Vol (A4C): 65.7 ml 32.34 ml/m  AORTIC VALVE LVOT Vmax:   75.00 cm/s LVOT Vmean:  54.500 cm/s LVOT VTI:    0.187 m  AORTA Ao Root diam: 2.90 cm MITRAL VALVE MV Area (PHT): 3.37 cm     SHUNTS MV Decel Time: 225 msec     Systemic VTI:  0.19 m MV E velocity: 132.00 cm/s  Systemic Diam: 2.20 cm MV A velocity: 99.10 cm/s MV E/A ratio:  1.33 Jenkins Rouge MD Electronically signed by Jenkins Rouge MD Signature Date/Time: 03/14/2020/1:53:02 PM    Final    VAS [place] LOWER EXTREMITY VENOUS (DVT)  Result Date: 03/14/2020  Lower Venous DVTStudy Indications: Pulmonary embolism.  Risk Factors: Confirmed PE. Limitations: Poor ultrasound/tissue interface. Comparison Study: No prior studies. Performing Technologist: Oliver Hum RVT Supporting Technologist: Leavy Cella RDCS  Examination Guidelines: A complete evaluation includes B-mode imaging,  spectral Doppler, color Doppler, and power Doppler as needed of all accessible portions of each vessel. Bilateral testing is considered an integral part of a complete examination. Limited examinations for reoccurring indications may be performed as noted. The reflux portion of the exam is performed with the patient in reverse Trendelenburg.  +---------+---------------+---------+-----------+----------+--------------+ RIGHT    CompressibilityPhasicitySpontaneityPropertiesThrombus Aging +---------+---------------+---------+-----------+----------+--------------+ CFV      Full           Yes      Yes                                 +---------+---------------+---------+-----------+----------+--------------+ SFJ      Full                                                        +---------+---------------+---------+-----------+----------+--------------+ FV Prox  Full                                                        +---------+---------------+---------+-----------+----------+--------------+ FV Mid   Full                                                        +---------+---------------+---------+-----------+----------+--------------+  FV DistalFull                                                        +---------+---------------+---------+-----------+----------+--------------+ PFV      Full                                                        +---------+---------------+---------+-----------+----------+--------------+ POP      Full           Yes      Yes                                 +---------+---------------+---------+-----------+----------+--------------+ PTV      Full                                                        +---------+---------------+---------+-----------+----------+--------------+ PERO     Full                                                        +---------+---------------+---------+-----------+----------+--------------+    +---------+---------------+---------+-----------+----------+--------------+ LEFT     CompressibilityPhasicitySpontaneityPropertiesThrombus Aging +---------+---------------+---------+-----------+----------+--------------+ CFV      Full           Yes      Yes                                 +---------+---------------+---------+-----------+----------+--------------+ SFJ      Full                                                        +---------+---------------+---------+-----------+----------+--------------+ FV Prox  Full                                                        +---------+---------------+---------+-----------+----------+--------------+ FV Mid   Full                                                        +---------+---------------+---------+-----------+----------+--------------+ FV DistalFull                                                        +---------+---------------+---------+-----------+----------+--------------+  PFV      Full                                                        +---------+---------------+---------+-----------+----------+--------------+ POP      Full           Yes      Yes                                 +---------+---------------+---------+-----------+----------+--------------+ PTV      Full                                                        +---------+---------------+---------+-----------+----------+--------------+ PERO     Full                                                        +---------+---------------+---------+-----------+----------+--------------+     Summary: RIGHT: - There is no evidence of deep vein thrombosis in the lower extremity.  - No cystic structure found in the popliteal fossa.  LEFT: - There is no evidence of deep vein thrombosis in the lower extremity.  - No cystic structure found in the popliteal fossa.  *See table(s) above for measurements and observations. Electronically signed  by Servando Snare MD on 03/14/2020 at 4:49:06 PM.    Final     ASSESSMENT & PLAN:    64 yo with   1)  Newly diagnosed Multiple myeloma -09/27/19 M Protein at 3.4 -10/25/2019 PET/CT ([id number]) revealed "1. No specific myelomatous lesions are identified. 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  2) Anemia ? Related to CKD vs ? Plasma cell dyscrasia 3) CKD ?etiology - HTN ? Cardiac issues ? Myeloma related. 4) recently diagnosed small Pulmonary embolism PLAN: -Discussed pt labwork today, 04/02/20; all values are WNL except for RBC at 3.48, Hgb at 8.9, HCT at 28.6, MCH at 25.6, RDW at 20.9, Potassium at 3.4, Glucose at 192, Creatinine at 1.51, Albumin at 3.1, AST at 9, GFR Est Afr Am at 56. -Discussed 03/19/20 MMP shows M Protein at 0.4 g/dL - nearly 90% reduction in his M Protein since beginning treatment.  -The pt has no prohibitive toxicities from continuing C6 Daratumumab/Dexamethasone at his time.  -Advised pt that after he reaches VGPR we have the option of continuing maintenance treatment or completing a transplant, then moving to maintenance.   -Advised pt that we would continue two-drug maintenance with high  risk genetics, which he has.  -Advised pt that an autologous transplant is not curative, but is used to get the deepest response possible. -Advised pt that the steroids he receives with treatment is helping  his arthritis. 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  -continue anticoagulation -close Dm2 mx per PCP -Discussed CDC guidelines regarding the [place] booster. Offered for pt to receive in clinic today.  -Recommend pt receive the annual flu vaccine. Wait two weeks between vaccinations if possible. -Recommend pt f/u with Dr. Estanislado Pandy ASAP.  -Will see back in 4 weeks with labs   FOLLOW UP: Plz schedule C6D15 Daratumumab with portflush and labs Plz schedule C7D1 of Daratumumab with portflush and labs and MD visit in 4 weeks   The total time spent in the appt was 30 minutes and more than 50% was on counseling and direct patient cares.  All of the patient's questions were answered with apparent satisfaction. The patient knows to call the clinic with any problems, questions or concerns.    Sullivan Lone MD [place] AAHIVMS [facility] Hematology/Oncology Physician [facility]  (Office):       [phone] (Work cell):  [phone] (Fax):           [phone]  04/02/2020 11:24 AM  I, Yevette Edwards, am acting as a scribe for Dr. Sullivan Lone.   .I have reviewed the above documentation for accuracy and completeness, and I agree with the above. Brunetta Genera MD

## 2020-04-05 ENCOUNTER — Other Ambulatory Visit (INDEPENDENT_AMBULATORY_CARE_PROVIDER_SITE_OTHER): Payer: No Typology Code available for payment source

## 2020-04-05 ENCOUNTER — Other Ambulatory Visit: Payer: Self-pay

## 2020-04-05 ENCOUNTER — Telehealth: Payer: Self-pay | Admitting: Hematology

## 2020-04-05 DIAGNOSIS — E1165 Type 2 diabetes mellitus with hyperglycemia: Secondary | ICD-10-CM

## 2020-04-05 LAB — BASIC METABOLIC PANEL
BUN: 29 mg/dL — ABNORMAL HIGH (ref 6–23)
CO2: 23 mEq/L (ref 19–32)
Calcium: 9 mg/dL (ref 8.4–10.5)
Chloride: 107 mEq/L (ref 96–112)
Creatinine, Ser: 1.62 mg/dL — ABNORMAL HIGH (ref 0.40–1.50)
GFR: 52.04 mL/min — ABNORMAL LOW (ref >60.00)
Glucose, Bld: 244 mg/dL — ABNORMAL HIGH (ref 70–99)
Potassium: 3.3 mEq/L — ABNORMAL LOW (ref 3.5–5.1)
Sodium: 141 mEq/L (ref 135–145)

## 2020-04-05 LAB — HEMOGLOBIN A1C: Hgb A1c MFr Bld: 8.1 % — ABNORMAL HIGH (ref 4.6–6.5)

## 2020-04-05 NOTE — Telephone Encounter (Signed)
Scheduled per los, patient has been called and notified of upcoming scheduled appointments.

## 2020-04-06 LAB — FRUCTOSAMINE: Fructosamine: 345 umol/L — ABNORMAL HIGH (ref 0–285)

## 2020-04-09 ENCOUNTER — Ambulatory Visit (INDEPENDENT_AMBULATORY_CARE_PROVIDER_SITE_OTHER): Payer: No Typology Code available for payment source | Admitting: Endocrinology

## 2020-04-09 ENCOUNTER — Other Ambulatory Visit: Payer: Self-pay

## 2020-04-09 VITALS — BP 130/70 | HR 80 | Ht 70.0 in | Wt 179.4 lb

## 2020-04-09 DIAGNOSIS — E876 Hypokalemia: Secondary | ICD-10-CM | POA: Diagnosis not present

## 2020-04-09 DIAGNOSIS — E1165 Type 2 diabetes mellitus with hyperglycemia: Secondary | ICD-10-CM | POA: Diagnosis not present

## 2020-04-09 LAB — POCT GLUCOSE (DEVICE FOR HOME USE): POC Glucose: 142 mg/dl — AB (ref 70–99)

## 2020-04-09 MED ORDER — POTASSIUM CHLORIDE CRYS ER 20 MEQ PO TBCR
20.0000 meq | EXTENDED_RELEASE_TABLET | Freq: Every day | ORAL | 0 refills | Status: DC
Start: 2020-04-09 — End: 2020-05-13

## 2020-04-09 NOTE — Patient Instructions (Addendum)
Take Glipizide on Tuesdays and wednesdays when getting Chemo and if sugar still > 200 on Thursdays  Check blood sugars on waking up 3-4 days a week  Also check blood sugars about 2 hours after meals and do this after different meals by rotation  Recommended blood sugar levels on waking up are 90-130 and about 2 hours after meal is 130-180  Please bring your blood sugar monitor to each visit, thank you

## 2020-04-09 NOTE — Progress Notes (Signed)
Patient ID: [name], male   DOB: 14-Feb-1956, 64 y.o.   MRN: [id number]           Reason for Appointment: Follow-up for Type 2 Diabetes   History of Present Illness:          Date of diagnosis of type 2 diabetes mellitus:   2018      Background history:  He has never been told to have diabetes but review of his previous labs indicate that he had high blood sugar 179 in 2014 His highest sugar has been 234 in 10/2016 when his A1c was 8.8, not clear if he was symptomatic at that time He was recommended referral for diabetes at that time but he did not make an appointment  Recent history:   A1c is most recently 8.1 Recent fructosamine 345, higher than before  Non-insulin hypoglycemic drugs the patient is taking are: Metformin 1 g daily  Current management, blood sugar patterns and problems identified:  He was getting hypoglycemic with taking Amaryl in addition to his Metformin on his last visit and this was stopped  He was changed to glipizide ER 2.5 mg daily and to take 5 mg on the days of his chemotherapy but he has not taken this at all  However his blood sugars appear to be at least over 200 at least in the week he has his steroids for the chemotherapy as seen on his lab work from last week  However he has not checked his blood sugars this week and does not remember his blood sugars otherwise.    Also not aware that he has to check his blood sugars by rotation at different times instead of just in the morning  Today after a relatively good lunch his blood sugar is only 142 in the office compared to 244 last week in the afternoon  He may have lost weight especially after his hospital admission 4 weeks ago  He has previously seen the dietitian for meal planning            Glucose monitoring:  Rarely, has Accu-Chek guide meter  Blood sugars by recall as above   Dietician visit, most recent: 2/21  Weight history:  Wt Readings from Last 3 Encounters:  04/09/20  179 lb 6.4 oz (81.4 kg)  04/02/20 177 lb (80.3 kg)  03/18/20 189 lb 13.1 oz (86.1 kg)    Glycemic control:   Lab Results  Component Value Date   HGBA1C 8.1 (H) 04/05/2020   HGBA1C 8.1 (H) 03/14/2020   HGBA1C 8.0 (H) 12/20/2019   Lab Results  Component Value Date   MICROALBUR 12.6 (H) 07/21/2018   LDLCALC 39 09/04/2019   CREATININE 1.62 (H) 04/05/2020   Lab Results  Component Value Date   MICRALBCREAT 10.0 07/21/2018    Lab Results  Component Value Date   FRUCTOSAMINE 345 (H) 04/05/2020   FRUCTOSAMINE 322 (H) 02/02/2020   FRUCTOSAMINE 311 (H) 11/20/2019    Lab on 04/05/2020  Component Date Value Ref Range Status  . Hgb A1c MFr Bld 04/05/2020 8.1* 4.6 - 6.5 % Final   Glycemic Control Guidelines for People with Diabetes:Non Diabetic:  <6%Goal of Therapy: <7%Additional Action Suggested:  >8%   . Fructosamine 04/05/2020 345* 0 - 285 umol/L Final   Comment: Published reference interval for apparently healthy subjects between age 31 and 22 is 59 - 285 umol/L and in a poorly controlled diabetic population is 228 - 563 umol/L with a mean of 396 umol/L.   [name]  Sodium 04/05/2020 141  135 - 145 mEq/L Final  . Potassium 04/05/2020 3.3* 3.5 - 5.1 mEq/L Final  . Chloride 04/05/2020 107  96 - 112 mEq/L Final  . CO2 04/05/2020 23  19 - 32 mEq/L Final  . Glucose, Bld 04/05/2020 244* 70 - 99 mg/dL Final  . BUN 04/05/2020 29* 6 - 23 mg/dL Final  . Creatinine, Ser 04/05/2020 1.62* 0.40 - 1.50 mg/dL Final  . GFR 04/05/2020 52.04* >60.00 mL/min Final  . Calcium 04/05/2020 9.0  8.4 - 10.5 mg/dL Final    Allergies as of 04/09/2020   No Known Allergies     Medication List       Accurate as of April 09, 2020  2:00 PM. If you have any questions, ask your nurse or doctor.        STOP taking these medications   apixaban 5 MG Tabs tablet Commonly known as: Eliquis Stopped by: Elayne Snare, MD   glipiZIDE 2.5 MG 24 hr tablet Commonly known as: GLUCOTROL XL Stopped by: Elayne Snare,  MD   losartan 100 MG tablet Commonly known as: COZAAR Stopped by: Elayne Snare, MD     TAKE these medications   Accu-Chek Guide Me w/Device Kit 1 each by Does not apply route.   Accu-Chek Guide test strip Generic drug: glucose blood Use Accu Chek test strips as instructed to check blood sugar fasting or two hours after meals, every other day.   Accu-Chek Softclix Lancets lancets Use Accu Chek softclix lancets to check blood sugar fasting or 2 hours after a meal every other day.   acyclovir 400 MG tablet Commonly known as: ZOVIRAX Take 0.5 tablets (200 mg total) by mouth 2 (two) times daily. What changed: Another medication with the same name was removed. Continue taking this medication, and follow the directions you see here. Changed by: Elayne Snare, MD   amLODipine 5 MG tablet Commonly known as: NORVASC Take 5 mg by mouth daily. What changed: Another medication with the same name was removed. Continue taking this medication, and follow the directions you see here. Changed by: Elayne Snare, MD   aspirin EC 81 MG tablet Take 81 mg by mouth daily. Swallow whole. What changed: Another medication with the same name was removed. Continue taking this medication, and follow the directions you see here. Changed by: Elayne Snare, MD   atorvastatin 80 MG tablet Commonly known as: LIPITOR Take 80 mg by mouth daily. What changed: Another medication with the same name was removed. Continue taking this medication, and follow the directions you see here. Changed by: Elayne Snare, MD   carvedilol 25 MG tablet Commonly known as: COREG Take 25 mg by mouth 2 (two) times daily with a meal. What changed: Another medication with the same name was removed. Continue taking this medication, and follow the directions you see here. Changed by: Elayne Snare, MD   dexamethasone 4 MG tablet Commonly known as: DECADRON Take 12 mg by mouth See admin instructions. Take after chemo treatments What changed:  Another medication with the same name was removed. Continue taking this medication, and follow the directions you see here. Changed by: Elayne Snare, MD   hydrALAZINE 50 MG tablet Commonly known as: APRESOLINE Take 50 mg by mouth 3 (three) times daily. What changed: Another medication with the same name was removed. Continue taking this medication, and follow the directions you see here. Changed by: Elayne Snare, MD   ibuprofen 200 MG tablet Commonly known as: ADVIL Take 200 mg  by mouth every 6 (six) hours as needed for moderate pain (for knee).   isosorbide mononitrate 30 MG 24 hr tablet Commonly known as: IMDUR Take 30 mg by mouth daily. What changed: Another medication with the same name was removed. Continue taking this medication, and follow the directions you see here. Changed by: [name], MD   metFORMIN 500 MG tablet Commonly known as: GLUCOPHAGE Take 1,000 mg by mouth daily with breakfast. What changed: Another medication with the same name was removed. Continue taking this medication, and follow the directions you see here. Changed by: [name], MD   ondansetron 8 MG tablet Commonly known as: ZOFRAN Take 8 mg by mouth every 8 (eight) hours as needed for nausea or vomiting. What changed: Another medication with the same name was removed. Continue taking this medication, and follow the directions you see here. Changed by: [name], MD   predniSONE 20 MG tablet Commonly known as: DELTASONE Take 20 mg by mouth daily as needed (arthiritis). What changed: Another medication with the same name was removed. Continue taking this medication, and follow the directions you see here. Changed by: [name], MD   prochlorperazine 10 MG tablet Commonly known as: COMPAZINE Take 1 tablet (10 mg total) by mouth every 6 (six) hours as needed (Nausea or vomiting). What changed: Another medication with the same name was removed. Continue taking this medication, and follow the  directions you see here. Changed by: [name], MD       Allergies: No Known Allergies  Past Medical History:  Diagnosis Date  . Anemia   . Arthritis    "left knee" (11/24/2017)  . CAD (coronary artery disease)    CABG 2002  . Cancer [facility])    Multiple Myeloma  . CHF (congestive heart failure) (HCC)   . Chronic kidney disease   . Chronic renal insufficiency   . Diabetes mellitus without complication (HCC)   . Dilated cardiomyopathy (HCC)    echo in 2009 showed improvement with a normal EF  . HTN (hypertension)   . Hyperlipemia   . Hypertension   . Noncompliance   . PAD (peripheral artery disease) (HCC)   . Pneumonia     Past Surgical History:  Procedure Laterality Date  . CARDIAC CATHETERIZATION  2002, 2006  . CORONARY ARTERY BYPASS GRAFT  2002   x4 DR. VAN TRIGT  . IR FLUORO GUIDED NEEDLE PLC ASPIRATION/INJECTION LOC  10/24/2019    Family History  Problem Relation Age of Onset  . Hypertension Father   . Diabetes Mother        DIABETIC COMA    Social History:  reports that he has never smoked. He has never used smokeless tobacco. He reports previous alcohol use. He reports previous drug use.   Review of Systems   Lipid history: On atorvastatin 80 mg since diagnosis of CAD, labs as follows    Lab Results  Component Value Date   CHOL 92 (L) 09/04/2019   HDL 35 (L) 09/04/2019   LDLCALC 39 09/04/2019   LDLDIRECT 149.4 01/07/2011   TRIG 89 09/04/2019   CHOLHDL 2.6 09/04/2019           Hypertension: Has been followed by cardiologist for blood pressure control Recent readings:  BP Readings from Last 3 Encounters:  04/09/20 130/70  04/02/20 (!) 146/56  04/02/20 136/70   RENAL dysfunction is decreased but about the same overall Also now appears to have mild persistent hypokalemia even without diuretics  No history  of proteinuria  Lab Results  Component Value Date   CREATININE 1.62 (H) 04/05/2020   CREATININE 1.51 (H) 04/02/2020   CREATININE 1.50  (H) 03/19/2020    Most recent eye exam was in 2018  Most recent foot exam: 07/2018  Currently known complications of diabetes:none  LABS:  Lab on 04/05/2020  Component Date Value Ref Range Status  . Hgb A1c MFr Bld 04/05/2020 8.1* 4.6 - 6.5 % Final   Glycemic Control Guidelines for People with Diabetes:Non Diabetic:  <6%Goal of Therapy: <7%Additional Action Suggested:  >8%   . Fructosamine 04/05/2020 345* 0 - 285 umol/L Final   Comment: Published reference interval for apparently healthy subjects between age 61 and 85 is 75 - 285 umol/L and in a poorly controlled diabetic population is 228 - 563 umol/L with a mean of 396 umol/L.   [name] Sodium 04/05/2020 141  135 - 145 mEq/L Final  . Potassium 04/05/2020 3.3* 3.5 - 5.1 mEq/L Final  . Chloride 04/05/2020 107  96 - 112 mEq/L Final  . CO2 04/05/2020 23  19 - 32 mEq/L Final  . Glucose, Bld 04/05/2020 244* 70 - 99 mg/dL Final  . BUN 04/05/2020 29* 6 - 23 mg/dL Final  . Creatinine, Ser 04/05/2020 1.62* 0.40 - 1.50 mg/dL Final  . GFR 04/05/2020 52.04* >60.00 mL/min Final  . Calcium 04/05/2020 9.0  8.4 - 10.5 mg/dL Final    Physical Examination:  BP 130/70 (BP Location: Left Arm, Patient Position: Sitting, Cuff Size: Normal)   Pulse 80   Ht $R'5\' 10"'hT$  (1.778 m)   Wt 179 lb 6.4 oz (81.4 kg)   SpO2 94%   BMI 25.74 kg/m          ASSESSMENT:  Diabetes type 2, mild  See history of present illness for detailed discussion of current diabetes management, blood sugar patterns and problems identified  A1c has been consistently around 8%  Fructosamine is now 345  His blood sugars are mostly significantly high on the few days after his chemotherapy every 2 weeks on Tuesdays However no clear-cut pattern is available since he checks blood sugars infrequently and did not bring his monitor This week he has not monitored glucose but his blood sugar is 142 after lunch  Most likely he has relatively good control when he does not get  chemotherapy with Metformin alone   PLAN:    1. Glucose monitoring: He will need to go back to checking blood sugars regularly Discussed that he can rotate between fasting and 2 hours after meals To bring monitor for each visit   2.    Medication recommendations He will only take GLIPIZIDE ER 2.5 mg for 2 to 3 days on Tuesdays when he gets his chemotherapy If his blood sugar is down by Thursday he will skip the dose on that day Written instructions given Continue Metformin 1 g daily since his GFR is still fairly good at 52  Follow-up in 1 month  HYPOKALEMIA: He will be started on potassium supplements, likely this is related to inconsistent intake, recent hospitalization and possibly hyperglycemia and steroids  There are no Patient Instructions on file for this visit.     Elayne Snare 04/09/2020, 2:00 PM   Note: This office note was prepared with Dragon voice recognition system technology. Any transcriptional errors that result from this process are unintentional.   Elayne Snare

## 2020-04-10 ENCOUNTER — Other Ambulatory Visit: Payer: Self-pay

## 2020-04-10 MED ORDER — ISOSORBIDE MONONITRATE ER 30 MG PO TB24
30.0000 mg | ORAL_TABLET | Freq: Every day | ORAL | 2 refills | Status: DC
Start: 2020-04-10 — End: 2021-01-08

## 2020-04-14 NOTE — Progress Notes (Signed)
CARDIOLOGY OFFICE NOTE  Date:  04/23/2020    [name] Date of Birth: May 15, 1956 Medical Record #[id number]  PCP:  [name], Joseph Halsted, MD  Cardiologist:  [name] & [name]  Chief Complaint  Patient presents with  . Follow-up    Seen for Dr. Martinique    History of Present Illness: [name] is a 64 y.o. male who presents today for a follow up visit. Seen for Dr. Martinique.He typically follows with me.   He has a history ofCAD s/p CABG 2002, HTN,dilated cardiomyopathy, hyperlipidemia andlong standingnoncompliance with follow-up/meds. Echocardiogram in 2009 showed significant improvement in his LV function and EF was normal. Cardiac catheterization in 2006 demonstrated diseasebut he wasnot a candidate for further revascularization. Remote CTA abdomen showed 50% left renal artery stenosis in 2006 as well. Follow up renal duplex was ok.  I have followed him for many years - he tends to go long periods between visits despite our recommendations. He ended up being admitted in May of 2019 with marked HTN and CHF requiring aggressive diuresis. Echo showed EF of 50 to 55% with DD. He has been found to have diabetes - took several visits to convince him to see Endocrine. His medicines always tend to be unclear as to what he is exactly taking.He has missed his follow up with Dr. Dwyane Welch for his diabetes.   When seen back in February - total protein elevated - sent to hematology - concern for MM. Noted significant weight loss.When seen back in April - he had retired - he had followed thru with getting primary care, seeing hematology, Renal, etc. He noted he "had more time to go to the doctor since he had retired". He was to have bone marrow biopsy with Dr. Irene Welch. He had not started his Norvasc that I had started at the visit prior - but otherwise, felt to be doing ok. I last saw him in June - he was doing ok - on infusions of Darzalex for his multiple myeloma.  BP had improved with the addition of Norvasc.    He was admitted about a month ago - wife found him "gurgling in bed" - he was hypoxic - was intubated - Bilateral consolidations and small PE - he was placed on Eliquis. Had UTI with severe sepsis due to possible aspiration. Looks like Losartan was stopped.   Comes in today. Here alone. He admits that he does not really know what he is taking in regards to his medicines. Says his wife is monitoring his medicines. Asking about his potassium. He does not know if he is on Eliquis or not.   Wife was brought in - we have verified his medicines. She notes Eliquis has been stopped. His current list appears to be correct. Unclear etiology of his event last month - ?aspirated or septic from UTI. Looks to have had small PE as well. He has not been back to PCP - no follow up CXR. He did not wish to go. He is on Prednisone for what sounds like gout. No chest pain. BP actually looks good.   Past Medical History:  Diagnosis Date  . Anemia   . Arthritis    "left knee" (11/24/2017)  . CAD (coronary artery disease)    CABG 2002  . Cancer [facility])    Multiple Myeloma  . CHF (congestive heart failure) ([place])   . Chronic kidney disease   . Chronic renal insufficiency   . Diabetes mellitus without complication ([place])   .  Dilated cardiomyopathy ([place])    echo in 2009 showed improvement with a normal EF  . HTN (hypertension)   . Hyperlipemia   . Hypertension   . Noncompliance   . PAD (peripheral artery disease) ([place])   . Pneumonia     Past Surgical History:  Procedure Laterality Date  . CARDIAC CATHETERIZATION  2002, 2006  . CORONARY ARTERY BYPASS GRAFT  2002   x4 DR. VAN Welch  . IR FLUORO GUIDED NEEDLE PLC ASPIRATION/INJECTION LOC  10/24/2019     Medications: Current Meds  Medication Sig  . Accu-Chek Softclix Lancets lancets Use Accu Chek softclix lancets to check blood sugar fasting or 2 hours after a meal every other day.  [name] acyclovir (ZOVIRAX) 400  MG tablet Take 0.5 tablets (200 mg total) by mouth 2 (two) times daily.  [name] amLODipine (NORVASC) 5 MG tablet Take 5 mg by mouth daily.  [name] aspirin EC 81 MG tablet Take 81 mg by mouth daily. Swallow whole.  [name] atorvastatin (LIPITOR) 80 MG tablet Take 80 mg by mouth daily.  . Blood Glucose Monitoring Suppl (ACCU-CHEK GUIDE ME) w/Device KIT 1 each by Does not apply route.  . carvedilol (COREG) 25 MG tablet Take 25 mg by mouth 2 (two) times daily with a meal.  . dexamethasone (DECADRON) 4 MG tablet Take 12 mg by mouth See admin instructions. Take after chemo treatments  . glucose blood (ACCU-CHEK GUIDE) test strip Use Accu Chek test strips as instructed to check blood sugar fasting or two hours after meals, every other day.  . hydrALAZINE (APRESOLINE) 50 MG tablet Take 50 mg by mouth 3 (three) times daily.  [name] ibuprofen (ADVIL,MOTRIN) 200 MG tablet Take 200 mg by mouth every 6 (six) hours as needed for moderate pain (for knee).  . isosorbide mononitrate (IMDUR) 30 MG 24 hr tablet Take 1 tablet (30 mg total) by mouth daily.  . metFORMIN (GLUCOPHAGE) 500 MG tablet Take 1,000 mg by mouth daily with breakfast.  . ondansetron (ZOFRAN) 8 MG tablet Take 8 mg by mouth every 8 (eight) hours as needed for nausea or vomiting.  . potassium chloride SA (KLOR-CON) 20 MEQ tablet Take 1 tablet (20 mEq total) by mouth daily.  . predniSONE (DELTASONE) 20 MG tablet Take 1 tablet (20 mg total) by mouth daily as needed (arthiritis).  . predniSONE (DELTASONE) 5 MG tablet 6 tab x 2 days, 5 tab x 2 days, 4 tab x 2 days, 3 tab x 2 days, 2 tab x 2 days, 1 tab x 2 days, stop  . prochlorperazine (COMPAZINE) 10 MG tablet Take 1 tablet (10 mg total) by mouth every 6 (six) hours as needed (Nausea or vomiting).    Allergies: No Known Allergies  Social History: The patient  reports that he has never smoked. He has never used smokeless tobacco. He reports previous alcohol use. He reports previous drug use.   Family History: The  patient's family history includes Diabetes in his mother; Hypertension in his father.   Review of Systems: Please see the history of present illness.   All other systems are reviewed and negative.   Physical Exam: VS:  BP 134/74   Pulse 60   Ht $R'5\' 10"'dT$  (1.778 m)   Wt 173 lb 6.4 oz (78.7 kg)   SpO2 98%   BMI 24.88 kg/m  .  BMI Body mass index is 24.88 kg/m.  Wt Readings from Last 3 Encounters:  04/23/20 173 lb 6.4 oz (78.7 kg)  04/09/20 179 lb  6.4 oz (81.4 kg)  04/02/20 177 lb (80.3 kg)    General: Pleasant. Well developed, well nourished and in no acute distress.   Cardiac: Regular rate and rhythm. No murmurs, rubs, or gallops. No edema.  Respiratory:  Lungs are clear to auscultation bilaterally with normal work of breathing.  GI: Soft and nontender.  MS: No deformity or atrophy. Gait and ROM intact.  Skin: Warm and dry. Color is normal.  Neuro:  Strength and sensation are intact and no gross focal deficits noted.  Psych: Alert, appropriate and with normal affect.   LABORATORY DATA:  EKG:  EKG is not ordered today.    Lab Results  Component Value Date   WBC 12.1 (H) 04/16/2020   HGB 8.8 (L) 04/16/2020   HCT 27.1 (L) 04/16/2020   PLT 252 04/16/2020   GLUCOSE 257 (H) 04/16/2020   CHOL 92 (L) 09/04/2019   TRIG 89 09/04/2019   HDL 35 (L) 09/04/2019   LDLDIRECT 149.4 01/07/2011   LDLCALC 39 09/04/2019   ALT 14 04/16/2020   AST 18 04/16/2020   NA 134 (L) 04/16/2020   K 3.5 04/16/2020   CL 103 04/16/2020   CREATININE 1.99 (H) 04/16/2020   BUN 17 04/16/2020   CO2 23 04/16/2020   TSH 0.355 03/13/2020   INR 1.1 03/13/2020   HGBA1C 8.1 (H) 04/05/2020   MICROALBUR 12.6 (H) 07/21/2018     BNP (last 3 results) Recent Labs    03/13/20 0642  BNP 419.0*    ProBNP (last 3 results) No results for input(s): PROBNP in the last 8760 hours.   Other Studies Reviewed Today:  Echo 11/25/2017 LV EF: 50% - 55% Study Conclusions  - Left ventricle: The cavity size  was normal. There was moderate concentric hypertrophy. Systolic function was normal. The estimated ejection fraction was in the range of 50% to 55%. Wall motion was normal; there were no regional wall motion abnormalities. Doppler parameters are consistent with abnormal left ventricular relaxation (grade 1 diastolic dysfunction). Doppler parameters are consistent with high ventricular filling pressure. - Aortic valve: Transvalvular velocity was within the normal range. There was no stenosis. There was no regurgitation. - Mitral valve: Transvalvular velocity was within the normal range. There was no evidence for stenosis. There was no regurgitation. - Left atrium: The atrium was moderately dilated. - Right ventricle: The cavity size was normal. Wall thickness was normal. Systolic function was normal. - Right atrium: The atrium was severely dilated. - Atrial septum: No defect or patent foramen ovale was identified by color flow Doppler. - Tricuspid valve: There was trivial regurgitation. - Pulmonary arteries: Systolic pressure was within the normal range. PA peak pressure: 36 mm Hg (S).   Assessment & Plan:  1. Ischemic heart disease - managed medically. No worrisome symptoms noted - would favor continued medical management.   2. HTN - BP looks good - he looks to be off ARB. No changes made today.   3. Multiple Myeloma - followed by Dr. Candise Che.   4. Recent bout of sepsis/hypoxia - unclear etiology - he looks to have recovered - will get CXR updated. Lab today.   5. Chronic diastolic HF - looks euvolemic.   6. CKD - followed by Renal.   7. HLD - on statin therapy  8. DM - per Endocrine.   Current medicines are reviewed with the patient today.  The patient does not have concerns regarding medicines other than what has been noted above.  The following changes have been  made:  See above.  Labs/ tests ordered today include:    Orders Placed This  Encounter  Procedures  . DG Chest 2 View  . Basic metabolic panel  . CBC     Disposition:   FU with me in about 3 months. Medicines seem to be verified today. He is doing well clinically.     Patient is agreeable to this plan and will call if any problems develop in the interim.   SignedTruitt Merle, NP  04/23/2020 12:51 PM  [facility] [address] [place], [place]  [address] Phone: [phone] Fax: [phone]

## 2020-04-16 ENCOUNTER — Inpatient Hospital Stay: Payer: No Typology Code available for payment source | Attending: Hematology

## 2020-04-16 ENCOUNTER — Inpatient Hospital Stay (HOSPITAL_BASED_OUTPATIENT_CLINIC_OR_DEPARTMENT_OTHER): Payer: No Typology Code available for payment source | Admitting: Medical

## 2020-04-16 ENCOUNTER — Other Ambulatory Visit: Payer: Self-pay

## 2020-04-16 ENCOUNTER — Other Ambulatory Visit: Payer: Self-pay | Admitting: Medical

## 2020-04-16 ENCOUNTER — Inpatient Hospital Stay: Payer: No Typology Code available for payment source

## 2020-04-16 VITALS — BP 107/57 | HR 71 | Temp 97.6°F | Resp 17

## 2020-04-16 DIAGNOSIS — I739 Peripheral vascular disease, unspecified: Secondary | ICD-10-CM | POA: Insufficient documentation

## 2020-04-16 DIAGNOSIS — Z951 Presence of aortocoronary bypass graft: Secondary | ICD-10-CM | POA: Insufficient documentation

## 2020-04-16 DIAGNOSIS — E785 Hyperlipidemia, unspecified: Secondary | ICD-10-CM | POA: Diagnosis not present

## 2020-04-16 DIAGNOSIS — Z5112 Encounter for antineoplastic immunotherapy: Secondary | ICD-10-CM | POA: Insufficient documentation

## 2020-04-16 DIAGNOSIS — R634 Abnormal weight loss: Secondary | ICD-10-CM | POA: Insufficient documentation

## 2020-04-16 DIAGNOSIS — Z23 Encounter for immunization: Secondary | ICD-10-CM | POA: Diagnosis not present

## 2020-04-16 DIAGNOSIS — M069 Rheumatoid arthritis, unspecified: Secondary | ICD-10-CM

## 2020-04-16 DIAGNOSIS — C9000 Multiple myeloma not having achieved remission: Secondary | ICD-10-CM | POA: Insufficient documentation

## 2020-04-16 DIAGNOSIS — I2510 Atherosclerotic heart disease of native coronary artery without angina pectoris: Secondary | ICD-10-CM | POA: Insufficient documentation

## 2020-04-16 DIAGNOSIS — Z7982 Long term (current) use of aspirin: Secondary | ICD-10-CM | POA: Diagnosis not present

## 2020-04-16 DIAGNOSIS — Z7952 Long term (current) use of systemic steroids: Secondary | ICD-10-CM | POA: Insufficient documentation

## 2020-04-16 DIAGNOSIS — I130 Hypertensive heart and chronic kidney disease with heart failure and stage 1 through stage 4 chronic kidney disease, or unspecified chronic kidney disease: Secondary | ICD-10-CM | POA: Insufficient documentation

## 2020-04-16 DIAGNOSIS — Z7189 Other specified counseling: Secondary | ICD-10-CM

## 2020-04-16 DIAGNOSIS — E1122 Type 2 diabetes mellitus with diabetic chronic kidney disease: Secondary | ICD-10-CM | POA: Diagnosis not present

## 2020-04-16 DIAGNOSIS — I509 Heart failure, unspecified: Secondary | ICD-10-CM | POA: Insufficient documentation

## 2020-04-16 DIAGNOSIS — M1990 Unspecified osteoarthritis, unspecified site: Secondary | ICD-10-CM | POA: Diagnosis not present

## 2020-04-16 DIAGNOSIS — I2699 Other pulmonary embolism without acute cor pulmonale: Secondary | ICD-10-CM | POA: Diagnosis not present

## 2020-04-16 DIAGNOSIS — D649 Anemia, unspecified: Secondary | ICD-10-CM | POA: Insufficient documentation

## 2020-04-16 DIAGNOSIS — Z79899 Other long term (current) drug therapy: Secondary | ICD-10-CM | POA: Insufficient documentation

## 2020-04-16 LAB — CMP (CANCER CENTER ONLY)
ALT: 14 U/L (ref 0–44)
AST: 18 U/L (ref 15–41)
Albumin: 2.7 g/dL — ABNORMAL LOW (ref 3.5–5.0)
Alkaline Phosphatase: 58 U/L (ref 38–126)
Anion gap: 8 (ref 5–15)
BUN: 17 mg/dL (ref 8–23)
CO2: 23 mmol/L (ref 22–32)
Calcium: 9.4 mg/dL (ref 8.9–10.3)
Chloride: 103 mmol/L (ref 98–111)
Creatinine: 1.99 mg/dL — ABNORMAL HIGH (ref 0.61–1.24)
GFR, Estimated: 34 mL/min — ABNORMAL LOW (ref >60)
Glucose, Bld: 257 mg/dL — ABNORMAL HIGH (ref 70–99)
Potassium: 3.5 mmol/L (ref 3.5–5.1)
Sodium: 134 mmol/L — ABNORMAL LOW (ref 135–145)
Total Bilirubin: 1.3 mg/dL — ABNORMAL HIGH (ref 0.3–1.2)
Total Protein: 7.3 g/dL (ref 6.5–8.1)

## 2020-04-16 LAB — CBC WITH DIFFERENTIAL/PLATELET
Abs Immature Granulocytes: 0.06 10*3/uL (ref 0.00–0.07)
Basophils Absolute: 0 10*3/uL (ref 0.0–0.1)
Basophils Relative: 0 %
Eosinophils Absolute: 0 10*3/uL (ref 0.0–0.5)
Eosinophils Relative: 0 %
HCT: 27.1 % — ABNORMAL LOW (ref 39.0–52.0)
Hemoglobin: 8.8 g/dL — ABNORMAL LOW (ref 13.0–17.0)
Immature Granulocytes: 1 %
Lymphocytes Relative: 8 %
Lymphs Abs: 0.9 10*3/uL (ref 0.7–4.0)
MCH: 25.6 pg — ABNORMAL LOW (ref 26.0–34.0)
MCHC: 32.5 g/dL (ref 30.0–36.0)
MCV: 78.8 fL — ABNORMAL LOW (ref 80.0–100.0)
Monocytes Absolute: 1.1 10*3/uL — ABNORMAL HIGH (ref 0.1–1.0)
Monocytes Relative: 9 %
Neutro Abs: 10 10*3/uL — ABNORMAL HIGH (ref 1.7–7.7)
Neutrophils Relative %: 82 %
Platelets: 252 10*3/uL (ref 150–400)
RBC: 3.44 MIL/uL — ABNORMAL LOW (ref 4.22–5.81)
RDW: 18.9 % — ABNORMAL HIGH (ref 11.5–15.5)
WBC: 12.1 10*3/uL — ABNORMAL HIGH (ref 4.0–10.5)
nRBC: 0 % (ref 0.0–0.2)

## 2020-04-16 MED ORDER — MONTELUKAST SODIUM 10 MG PO TABS
ORAL_TABLET | ORAL | Status: AC
  Filled 2020-04-16: qty 1

## 2020-04-16 MED ORDER — MEPERIDINE HCL 25 MG/ML IJ SOLN
25.0000 mg | Freq: Once | INTRAMUSCULAR | Status: AC
  Administered 2020-04-16: 25 mg via INTRAVENOUS

## 2020-04-16 MED ORDER — SODIUM CHLORIDE 0.9 % IV SOLN
16.0000 mg/kg | Freq: Once | INTRAVENOUS | Status: AC
  Administered 2020-04-16: 1300 mg via INTRAVENOUS
  Filled 2020-04-16: qty 60

## 2020-04-16 MED ORDER — SODIUM CHLORIDE 0.9 % IV SOLN
Freq: Once | INTRAVENOUS | Status: AC
  Filled 2020-04-16: qty 250

## 2020-04-16 MED ORDER — ACETAMINOPHEN 325 MG PO TABS
ORAL_TABLET | ORAL | Status: AC
  Filled 2020-04-16: qty 2

## 2020-04-16 MED ORDER — FAMOTIDINE IN NACL 20-0.9 MG/50ML-% IV SOLN
INTRAVENOUS | Status: AC
  Filled 2020-04-16: qty 50

## 2020-04-16 MED ORDER — SODIUM CHLORIDE 0.9 % IV SOLN
40.0000 mg | Freq: Once | INTRAVENOUS | Status: AC
  Administered 2020-04-16: 40 mg via INTRAVENOUS
  Filled 2020-04-16: qty 4

## 2020-04-16 MED ORDER — METHYLPREDNISOLONE SODIUM SUCC 125 MG IJ SOLR
100.0000 mg | Freq: Once | INTRAMUSCULAR | Status: AC
  Administered 2020-04-16: 100 mg via INTRAVENOUS

## 2020-04-16 MED ORDER — PREDNISONE 5 MG PO TABS: ORAL_TABLET | ORAL | 0 refills | Status: DC

## 2020-04-16 MED ORDER — MEPERIDINE HCL 25 MG/ML IJ SOLN
INTRAMUSCULAR | Status: AC
  Filled 2020-04-16: qty 1

## 2020-04-16 MED ORDER — ACETAMINOPHEN 325 MG PO TABS
650.0000 mg | ORAL_TABLET | Freq: Once | ORAL | Status: AC
  Administered 2020-04-16: 650 mg via ORAL

## 2020-04-16 MED ORDER — PREDNISONE 20 MG PO TABS
20.0000 mg | ORAL_TABLET | Freq: Every day | ORAL | 3 refills | Status: DC | PRN
Start: 2020-04-16 — End: 2020-08-15

## 2020-04-16 MED ORDER — MONTELUKAST SODIUM 10 MG PO TABS
10.0000 mg | ORAL_TABLET | Freq: Once | ORAL | Status: AC
  Administered 2020-04-16: 10 mg via ORAL

## 2020-04-16 MED ORDER — METHYLPREDNISOLONE SODIUM SUCC 125 MG IJ SOLR
INTRAMUSCULAR | Status: AC
  Filled 2020-04-16: qty 2

## 2020-04-16 MED ORDER — DIPHENHYDRAMINE HCL 25 MG PO CAPS
ORAL_CAPSULE | ORAL | Status: AC
  Filled 2020-04-16: qty 2

## 2020-04-16 MED ORDER — DIPHENHYDRAMINE HCL 25 MG PO CAPS
50.0000 mg | ORAL_CAPSULE | Freq: Once | ORAL | Status: AC
  Administered 2020-04-16: 50 mg via ORAL

## 2020-04-16 NOTE — Patient Instructions (Signed)
[facility] Discharge Instructions for Patients Receiving Chemotherapy  Today you received the following chemotherapy agents:  Darzalex  To help prevent nausea and vomiting after your treatment, we encourage you to take your nausea medication as prescribed.   If you develop nausea and vomiting that is not controlled by your nausea medication, call the clinic.   BELOW ARE SYMPTOMS THAT SHOULD BE REPORTED IMMEDIATELY:  *FEVER GREATER THAN 100.5 F  *CHILLS WITH OR WITHOUT FEVER  NAUSEA AND VOMITING THAT IS NOT CONTROLLED WITH YOUR NAUSEA MEDICATION  *UNUSUAL SHORTNESS OF BREATH  *UNUSUAL BRUISING OR BLEEDING  TENDERNESS IN MOUTH AND THROAT WITH OR WITHOUT PRESENCE OF ULCERS  *URINARY PROBLEMS  *BOWEL PROBLEMS  UNUSUAL RASH Items with * indicate a potential emergency and should be followed up as soon as possible.  Feel free to call the clinic should you have any questions or concerns. The clinic phone number is [phone].  Please show the CHEMO ALERT CARD at check-in to the Emergency Department and triage nurse.   

## 2020-04-17 DIAGNOSIS — M069 Rheumatoid arthritis, unspecified: Secondary | ICD-10-CM | POA: Insufficient documentation

## 2020-04-17 NOTE — Progress Notes (Signed)
[name] is a 64 year old male who is managed by Dr. Irene Limbo for multiple myeloma.  He presents to day for treatment and was seen prior to his treatment with a report of pain and swelling in his bilateral upper extremities greater on the left than right.  He also has been having pain in his back.  According to his wife who accompanies him, he has not been able to feed himself due to this pain.  He has a history of rheumatoid arthritis and is awaiting a consult with rheumatology which is not scheduled until January 2022.  He has listed on his medication list that he takes prednisone 20 mg once daily when he has a flare of his arthritis but has run out of this medication.  He has not had it for undetermined period of time.  After discussion with Dr. Irene Limbo, a prescription for a prednisone taper was given to the patient.  Additionally, the patient was given a prescription of prednisone 20 mg once daily which he is to take as needed for flares of his arthritis.  He will begin this if needed after he is completed his taper.  Sandi Mealy, MHS, PA-C Physician Assistant

## 2020-04-23 ENCOUNTER — Ambulatory Visit (INDEPENDENT_AMBULATORY_CARE_PROVIDER_SITE_OTHER): Payer: No Typology Code available for payment source | Admitting: Nurse Practitioner

## 2020-04-23 ENCOUNTER — Ambulatory Visit
Admission: RE | Admit: 2020-04-23 | Discharge: 2020-04-23 | Disposition: A | Discharge status: Home / Self Care | Payer: No Typology Code available for payment source | Source: Ambulatory Visit | Attending: Nurse Practitioner | Admitting: Nurse Practitioner

## 2020-04-23 ENCOUNTER — Encounter: Payer: Self-pay | Admitting: Nurse Practitioner

## 2020-04-23 ENCOUNTER — Other Ambulatory Visit: Payer: Self-pay

## 2020-04-23 VITALS — BP 134/74 | HR 60 | Ht 70.0 in | Wt 173.4 lb

## 2020-04-23 DIAGNOSIS — I10 Essential (primary) hypertension: Secondary | ICD-10-CM

## 2020-04-23 DIAGNOSIS — I5032 Chronic diastolic (congestive) heart failure: Secondary | ICD-10-CM | POA: Diagnosis not present

## 2020-04-23 DIAGNOSIS — I259 Chronic ischemic heart disease, unspecified: Secondary | ICD-10-CM

## 2020-04-23 DIAGNOSIS — E876 Hypokalemia: Secondary | ICD-10-CM

## 2020-04-23 DIAGNOSIS — I25810 Atherosclerosis of coronary artery bypass graft(s) without angina pectoris: Secondary | ICD-10-CM

## 2020-04-23 LAB — BASIC METABOLIC PANEL
BUN/Creatinine Ratio: 18 (ref 10–24)
BUN: 26 mg/dL (ref 8–27)
CO2: 20 mmol/L (ref 20–29)
Calcium: 9.4 mg/dL (ref 8.6–10.2)
Chloride: 103 mmol/L (ref 96–106)
Creatinine, Ser: 1.46 mg/dL — ABNORMAL HIGH (ref 0.76–1.27)
GFR calc Af Amer: 58 mL/min/{1.73_m2} — ABNORMAL LOW (ref >59)
GFR calc non Af Amer: 50 mL/min/{1.73_m2} — ABNORMAL LOW (ref >59)
Glucose: 187 mg/dL — ABNORMAL HIGH (ref 65–99)
Potassium: 4.6 mmol/L (ref 3.5–5.2)
Sodium: 134 mmol/L (ref 134–144)

## 2020-04-23 LAB — CBC
Hematocrit: 29.1 % — ABNORMAL LOW (ref 37.5–51.0)
Hemoglobin: 9.4 g/dL — ABNORMAL LOW (ref 13.0–17.7)
MCH: 24.9 pg — ABNORMAL LOW (ref 26.6–33.0)
MCHC: 32.3 g/dL (ref 31.5–35.7)
MCV: 77 fL — ABNORMAL LOW (ref 79–97)
Platelets: 390 10*3/uL (ref 150–450)
RBC: 3.77 x10E6/uL — ABNORMAL LOW (ref 4.14–5.80)
RDW: 18.7 % — ABNORMAL HIGH (ref 11.6–15.4)
WBC: 7.8 10*3/uL (ref 3.4–10.8)

## 2020-04-23 NOTE — Patient Instructions (Addendum)
After Visit Summary:  We will be checking the following labs today - BMET/CBC   Please go to [place] to [facility]) on the first floor for a chest Xray - you may walk in.     Medication Instructions:    Continue with your current medicines.    If you need a refill on your cardiac medications before your next appointment, please call your pharmacy.     Testing/Procedures To Be Arranged:  N/A  Follow-Up:   See me in 3 months    At [facility], you and your health needs are our priority.  As part of our continuing mission to provide you with exceptional heart care, we have created designated Provider Care Teams.  These Care Teams include your primary Cardiologist (physician) and Advanced Practice Providers (APPs -  Physician Assistants and Nurse Practitioners) who all work together to provide you with the care you need, when you need it.  Special Instructions:  . Stay safe, wash your hands for at least 20 seconds and wear a mask when needed.  . It was good to talk with you today.    Call the [place] office at [phone] if you have any questions, problems or concerns.

## 2020-04-23 NOTE — Progress Notes (Addendum)
The following Medication: Darzalex has been approved thru [employer] as Assistance Program. Enrollment period is 04/22/2020 to 04/22/2021. Assistance ID: [id number], medication is ordered prior to appointment to be on hand for treatment.  First DOS: 04/30/2020 Assist Approved due to: Self-Pay- No chemo infusion benefit on primary insurance plan.

## 2020-04-24 ENCOUNTER — Telehealth: Payer: Self-pay

## 2020-04-24 ENCOUNTER — Telehealth: Payer: Self-pay | Admitting: Nurse Practitioner

## 2020-04-24 NOTE — Telephone Encounter (Signed)
Called the patient and left a message that we would give him a call with results as soon as they are reviewed.

## 2020-04-24 NOTE — Telephone Encounter (Signed)
-----   Message from Burtis Junes, NP sent at 04/24/2020  1:07 PM EDT ----- Can someone please call - let him know I do not have the CXR results yet.   Hopefully will hear later today.   lori

## 2020-04-24 NOTE — Telephone Encounter (Signed)
Patient returning phone call. Transferred to [employer].

## 2020-04-25 ENCOUNTER — Telehealth: Payer: Self-pay

## 2020-04-25 DIAGNOSIS — R918 Other nonspecific abnormal finding of lung field: Secondary | ICD-10-CM

## 2020-04-25 NOTE — Telephone Encounter (Signed)
See 10/14 phone note.

## 2020-04-25 NOTE — Telephone Encounter (Signed)
Pt is returning Sheleigh's call. I advised him of Sheleigh's message and he verbalized understanding.

## 2020-04-25 NOTE — Telephone Encounter (Signed)
-----   Message from Burtis Junes, NP sent at 04/25/2020  7:39 AM EDT ----- Ok to report. Not sure why there was a delay in the reading.  But his CXR shows improvement - not resolution.  Would favor recheck in one month with PA/Lat CXR at [place].

## 2020-04-25 NOTE — Telephone Encounter (Signed)
Reviewed results with patient who verbalized understanding.   Order placed for the patient to have CXR in 1 month. He was grateful for assistance.

## 2020-04-30 ENCOUNTER — Inpatient Hospital Stay: Payer: No Typology Code available for payment source

## 2020-04-30 ENCOUNTER — Inpatient Hospital Stay (HOSPITAL_BASED_OUTPATIENT_CLINIC_OR_DEPARTMENT_OTHER): Payer: No Typology Code available for payment source | Admitting: Hematology

## 2020-04-30 ENCOUNTER — Other Ambulatory Visit: Payer: Self-pay

## 2020-04-30 ENCOUNTER — Ambulatory Visit: Payer: No Typology Code available for payment source

## 2020-04-30 VITALS — BP 163/87 | HR 74 | Temp 97.2°F | Resp 18 | Ht 70.0 in | Wt 165.4 lb

## 2020-04-30 VITALS — BP 131/59 | HR 70 | Temp 97.6°F | Resp 20

## 2020-04-30 DIAGNOSIS — Z5112 Encounter for antineoplastic immunotherapy: Secondary | ICD-10-CM | POA: Diagnosis not present

## 2020-04-30 DIAGNOSIS — D649 Anemia, unspecified: Secondary | ICD-10-CM

## 2020-04-30 DIAGNOSIS — C9000 Multiple myeloma not having achieved remission: Secondary | ICD-10-CM | POA: Diagnosis not present

## 2020-04-30 DIAGNOSIS — Z7189 Other specified counseling: Secondary | ICD-10-CM

## 2020-04-30 LAB — CMP (CANCER CENTER ONLY)
ALT: 6 U/L (ref 0–44)
AST: 8 U/L — ABNORMAL LOW (ref 15–41)
Albumin: 2.8 g/dL — ABNORMAL LOW (ref 3.5–5.0)
Alkaline Phosphatase: 85 U/L (ref 38–126)
Anion gap: 8 (ref 5–15)
BUN: 15 mg/dL (ref 8–23)
CO2: 22 mmol/L (ref 22–32)
Calcium: 9.3 mg/dL (ref 8.9–10.3)
Chloride: 107 mmol/L (ref 98–111)
Creatinine: 1.5 mg/dL — ABNORMAL HIGH (ref 0.61–1.24)
GFR, Estimated: 49 mL/min — ABNORMAL LOW (ref >60)
Glucose, Bld: 298 mg/dL — ABNORMAL HIGH (ref 70–99)
Potassium: 4 mmol/L (ref 3.5–5.1)
Sodium: 137 mmol/L (ref 135–145)
Total Bilirubin: 0.5 mg/dL (ref 0.3–1.2)
Total Protein: 6.7 g/dL (ref 6.5–8.1)

## 2020-04-30 LAB — CBC WITH DIFFERENTIAL/PLATELET
Abs Immature Granulocytes: 0.02 10*3/uL (ref 0.00–0.07)
Basophils Absolute: 0 10*3/uL (ref 0.0–0.1)
Basophils Relative: 0 %
Eosinophils Absolute: 0.1 10*3/uL (ref 0.0–0.5)
Eosinophils Relative: 1 %
HCT: 32 % — ABNORMAL LOW (ref 39.0–52.0)
Hemoglobin: 9.8 g/dL — ABNORMAL LOW (ref 13.0–17.0)
Immature Granulocytes: 0 %
Lymphocytes Relative: 12 %
Lymphs Abs: 1 10*3/uL (ref 0.7–4.0)
MCH: 24.8 pg — ABNORMAL LOW (ref 26.0–34.0)
MCHC: 30.6 g/dL (ref 30.0–36.0)
MCV: 81 fL (ref 80.0–100.0)
Monocytes Absolute: 0.4 10*3/uL (ref 0.1–1.0)
Monocytes Relative: 4 %
Neutro Abs: 6.7 10*3/uL (ref 1.7–7.7)
Neutrophils Relative %: 83 %
Platelets: 273 10*3/uL (ref 150–400)
RBC: 3.95 MIL/uL — ABNORMAL LOW (ref 4.22–5.81)
RDW: 19.1 % — ABNORMAL HIGH (ref 11.5–15.5)
WBC: 8.2 10*3/uL (ref 4.0–10.5)
nRBC: 0 % (ref 0.0–0.2)

## 2020-04-30 MED ORDER — SODIUM CHLORIDE 0.9% FLUSH
10.0000 mL | INTRAVENOUS | Status: AC | PRN
  Filled 2020-04-30: qty 10

## 2020-04-30 MED ORDER — MEPERIDINE HCL 25 MG/ML IJ SOLN
INTRAMUSCULAR | Status: AC
  Filled 2020-04-30: qty 1

## 2020-04-30 MED ORDER — MEPERIDINE HCL 25 MG/ML IJ SOLN
25.0000 mg | Freq: Once | INTRAMUSCULAR | Status: AC
  Administered 2020-04-30: 25 mg via INTRAVENOUS

## 2020-04-30 MED ORDER — SODIUM CHLORIDE 0.9 % IV SOLN
Freq: Once | INTRAVENOUS | Status: AC
  Filled 2020-04-30: qty 250

## 2020-04-30 MED ORDER — ACETAMINOPHEN 325 MG PO TABS
ORAL_TABLET | ORAL | Status: AC
  Filled 2020-04-30: qty 2

## 2020-04-30 MED ORDER — SODIUM CHLORIDE 0.9 % IV SOLN
40.0000 mg | Freq: Once | INTRAVENOUS | Status: DC
  Filled 2020-04-30: qty 4

## 2020-04-30 MED ORDER — SODIUM CHLORIDE 0.9 % IV SOLN
16.0000 mg/kg | Freq: Once | INTRAVENOUS | Status: AC
  Administered 2020-04-30: 1300 mg via INTRAVENOUS
  Filled 2020-04-30: qty 60

## 2020-04-30 MED ORDER — MONTELUKAST SODIUM 10 MG PO TABS
10.0000 mg | ORAL_TABLET | Freq: Once | ORAL | Status: AC
  Administered 2020-04-30: 10 mg via ORAL

## 2020-04-30 MED ORDER — METHYLPREDNISOLONE SODIUM SUCC 125 MG IJ SOLR
100.0000 mg | Freq: Once | INTRAMUSCULAR | Status: AC
  Administered 2020-04-30: 100 mg via INTRAVENOUS

## 2020-04-30 MED ORDER — METHYLPREDNISOLONE SODIUM SUCC 125 MG IJ SOLR
INTRAMUSCULAR | Status: AC
  Filled 2020-04-30: qty 2

## 2020-04-30 MED ORDER — ACETAMINOPHEN 325 MG PO TABS
650.0000 mg | ORAL_TABLET | Freq: Once | ORAL | Status: AC
  Administered 2020-04-30: 650 mg via ORAL

## 2020-04-30 MED ORDER — DIPHENHYDRAMINE HCL 25 MG PO CAPS
50.0000 mg | ORAL_CAPSULE | Freq: Once | ORAL | Status: AC
  Administered 2020-04-30: 50 mg via ORAL

## 2020-04-30 MED ORDER — MONTELUKAST SODIUM 10 MG PO TABS
ORAL_TABLET | ORAL | Status: AC
  Filled 2020-04-30: qty 1

## 2020-04-30 MED ORDER — DIPHENHYDRAMINE HCL 25 MG PO CAPS
ORAL_CAPSULE | ORAL | Status: AC
  Filled 2020-04-30: qty 2

## 2020-04-30 MED ORDER — SODIUM CHLORIDE 0.9 % IV SOLN
40.0000 mg | Freq: Once | INTRAVENOUS | Status: AC
  Administered 2020-04-30: 40 mg via INTRAVENOUS
  Filled 2020-04-30: qty 4

## 2020-04-30 NOTE — Patient Instructions (Signed)
[facility] Discharge Instructions for Patients Receiving Chemotherapy  Today you received the following chemotherapy agent: Daratumumab (Darzalex)  To help prevent nausea and vomiting after your treatment, we encourage you to take your nausea medication as directed by your MD.   If you develop nausea and vomiting that is not controlled by your nausea medication, call the clinic.   BELOW ARE SYMPTOMS THAT SHOULD BE REPORTED IMMEDIATELY:  *FEVER GREATER THAN 100.5 F  *CHILLS WITH OR WITHOUT FEVER  NAUSEA AND VOMITING THAT IS NOT CONTROLLED WITH YOUR NAUSEA MEDICATION  *UNUSUAL SHORTNESS OF BREATH  *UNUSUAL BRUISING OR BLEEDING  TENDERNESS IN MOUTH AND THROAT WITH OR WITHOUT PRESENCE OF ULCERS  *URINARY PROBLEMS  *BOWEL PROBLEMS  UNUSUAL RASH Items with * indicate a potential emergency and should be followed up as soon as possible.  Feel free to call the clinic should you have any questions or concerns. The clinic phone number is [phone].  Please show the CHEMO ALERT CARD at check-in to the Emergency Department and triage nurse.   

## 2020-04-30 NOTE — Progress Notes (Signed)
   Covid-19 Vaccination Clinic  Name:  Peace Jost    MRN: [id number] DOB: [birth year]  04/30/2020  Mr. Breshears was observed post Covid-19 immunization for 15 minutes without incident. He was provided with Vaccine Information Sheet and instruction to access the V-Safe system. Injection given in right forearm instead of left which was charted previously.  Mr. Berrett was instructed to call 911 with any severe reactions post vaccine: [name] Difficulty breathing  . Swelling of face and throat  . A fast heartbeat  . A bad rash all over body  . Dizziness and weakness

## 2020-04-30 NOTE — Progress Notes (Signed)
   Covid-19 Vaccination Clinic  Name:  [name]    MRN: [id number] DOB: 1955-11-19  04/30/2020  [name] was observed post Covid-19 immunization for 15 minutes without incident. He was provided with Vaccine Information Sheet and instruction to access the V-Safe system. Pt. Injection given in right arm instead of left arm as charted.  [name] was instructed to call 911 with any severe reactions post vaccine: [name] Difficulty breathing  . Swelling of face and throat  . A fast heartbeat  . A bad rash all over body  . Dizziness and weakness

## 2020-04-30 NOTE — Progress Notes (Signed)
HEMATOLOGY/ONCOLOGY CLINIC NOTE  Date of Service: 04/30/2020  Patient Care Team: Isaac Bliss, Rayford Halsted, MD as PCP - General (Internal Medicine) [place]ique, Peter M, MD as PCP - Cardiology (Cardiology) Isaac Bliss, Rayford Halsted, MD (Internal Medicine)  CHIEF COMPLAINTS/PURPOSE OF CONSULTATION:  Continued mx of myeloma  HISTORY OF PRESENTING ILLNESS:  [name] is a wonderful 64 y.o. male who has been referred to [place] by Dr Servando Snare for evaluation and management of elevated protein. Pt is accompanied today by his wife, [name]. The pt reports that he is doing well overall.    The pt reports he was first told that he had Diabetes two years ago. He has not previously needed to be on medications for his diabetes but has recently been started on Januvia by Dr. Elayne Snare, his Endocrinologist. His blood glucose was 149 this morning. He sees Burtis Junes - NP and Dr. Martinique for Cardiology. Pt has CAD and had to have open heart surgery 20 years ago. He also has HTN that he feels has been stable. His Cardiology team is currently managing his heart medications and recently told pt to discontinue taking Furosemide due kidney function on last labs. Dr. Martinique and Truitt Merle have been essentially functioning as his primary care, as he does not have a PCP at this time. He has an upcoming appointment with Truitt Merle in the beginning of May and Dr. [name] next Tuesday. Pt is currently using Ibuprofen a couple of times per week for his knee pain.   Pt has felt the same in the last 3-6 months and denies any new bone pain of any kinds. He is eating, functioning, and moving about as normal. He has lost about 13 lbs over the last few months. Pt does not smoke and does not drink much alcohol.    Most recent lab results (09/04/2019) of CBC, BMP and Hepatic function panel is as follows: all values are WNL except for RBC at 3.64, Hgb at 9.8, HCT at 29.8, Glucose at 129, BUN at 64, Creatinine  at 2.62, GFR Est Af Am at 29, CO2 at 15, Total Protein at 10.0, Albumin at 3.6, ALP at 163, ALT at 45.    On review of systems, pt reports unexpected weight loss, left knee pain and denies new back pain, new shoulder pain, new hip pain, chest pain, SOB, leg swelling, testicular pain/swelling and any other symptoms.    On PMHx the pt reports Left Knee Arthritis, CAD, CHF, Type II Diabetes, HTN, HLD, Cardiac Catheterization, Coronary Artery Bypass Graft x4 (2002). On Social Hx the pt reports that he is a non-smoker and does not drink much alcohol.  INTERVAL HISTORY:  [name] is a wonderful 64 y.o. male is here for evaluation and management of newly diagnosed myeloma. He is here for [place]. The patient's last visit with [place] was on 04/02/2020. The pt reports that he is doing well overall.  The pt reports that the arthritis in his hands has not bothered him in the last three days. Pt has contacted Dr. Arlean Hopping office, but has not been able to get an earlier appointment. His current appointment is still set for January. He is working with Dr. Elayne Snare to control his Diabetes, but notes that his blood glucose has not been well-controlled at home.   He has been able to remain active, but his appetite has not been very robust.   Lab results today (04/30/20) of CBC w/diff and CMP  is as follows: all values are WNL except for RBC at 3.95, Hgb at 9.8, HCT at 32.0, MCH at 24.8, RDW at 19.1, Glucose at 298, Creatinine at 1.50, Albumin at 2.8, AST at 8, GFR Est at 49.  On review of systems, pt reports low appetite, improved joint pain/swelling and denies leg swelling, chest pain, abdominal pain and any other symptoms.   MEDICAL HISTORY:  Past Medical History:  Diagnosis Date  . Anemia   . Arthritis    "left knee" (11/24/2017)  . CAD (coronary artery disease)    CABG 2002  . Cancer [facility])    Multiple Myeloma  . CHF (congestive heart failure) ([place])   . Chronic kidney disease   .  Chronic renal insufficiency   . Diabetes mellitus without complication ([place])   . Dilated cardiomyopathy ([place])    echo in 2009 showed improvement with a normal EF  . HTN (hypertension)   . Hyperlipemia   . Hypertension   . Noncompliance   . PAD (peripheral artery disease) ([place])   . Pneumonia     SURGICAL HISTORY: Past Surgical History:  Procedure Laterality Date  . CARDIAC CATHETERIZATION  2002, 2006  . CORONARY ARTERY BYPASS GRAFT  2002   x4 DR. VAN TRIGT  . IR FLUORO GUIDED NEEDLE PLC ASPIRATION/INJECTION LOC  10/24/2019    SOCIAL HISTORY: Social History   Socioeconomic History  . Marital status: Married    Spouse name: Not on file  . Number of children: 1  . Years of education: Not on file  . Highest education level: Not on file  Occupational History  . Occupation: Furniture conservator/restorer  Tobacco Use  . Smoking status: Never Smoker  . Smokeless tobacco: Never Used  Vaping Use  . Vaping Use: Never used  Substance and Sexual Activity  . Alcohol use: Not Currently  . Drug use: Not Currently  . Sexual activity: Yes  Other Topics Concern  . Not on file  Social History Narrative   ** Merged History Encounter **       Social Determinants of Health   Financial Resource Strain:   . Difficulty of Paying Living Expenses: Not on file  Food Insecurity:   . Worried About Charity fundraiser in the Last Year: Not on file  . Ran Out of Food in the Last Year: Not on file  Transportation Needs:   . Lack of Transportation (Medical): Not on file  . Lack of Transportation (Non-Medical): Not on file  Physical Activity:   . Days of Exercise per Week: Not on file  . Minutes of Exercise per Session: Not on file  Stress:   . Feeling of Stress : Not on file  Social Connections:   . Frequency of Communication with Friends and Family: Not on file  . Frequency of Social Gatherings with Friends and Family: Not on file  . Attends Religious Services: Not on file  . Active Member of Clubs or  Organizations: Not on file  . Attends Archivist Meetings: Not on file  . Marital Status: Not on file  Intimate Partner Violence:   . Fear of Current or Ex-Partner: Not on file  . Emotionally Abused: Not on file  . Physically Abused: Not on file  . Sexually Abused: Not on file    FAMILY HISTORY: Family History  Problem Relation Age of Onset  . Hypertension Father   . Diabetes Mother        DIABETIC COMA    ALLERGIES:  has No Known Allergies.  MEDICATIONS:  Current Outpatient Medications  Medication Sig Dispense Refill  . Accu-Chek Softclix Lancets lancets Use Accu Chek softclix lancets to check blood sugar fasting or 2 hours after a meal every other day. 100 each 2  . acyclovir (ZOVIRAX) 400 MG tablet Take 0.5 tablets (200 mg total) by mouth 2 (two) times daily. 60 tablet 11  . amLODipine (NORVASC) 5 MG tablet Take 5 mg by mouth daily.    [name] aspirin EC 81 MG tablet Take 81 mg by mouth daily. Swallow whole.    [name] atorvastatin (LIPITOR) 80 MG tablet Take 80 mg by mouth daily.    . Blood Glucose Monitoring Suppl (ACCU-CHEK GUIDE ME) w/Device KIT 1 each by Does not apply route.    . carvedilol (COREG) 25 MG tablet Take 25 mg by mouth 2 (two) times daily with a meal.    . dexamethasone (DECADRON) 4 MG tablet Take 12 mg by mouth See admin instructions. Take after chemo treatments    . glucose blood (ACCU-CHEK GUIDE) test strip Use Accu Chek test strips as instructed to check blood sugar fasting or two hours after meals, every other day. 100 each 2  . hydrALAZINE (APRESOLINE) 50 MG tablet Take 50 mg by mouth 3 (three) times daily.    [name] ibuprofen (ADVIL,MOTRIN) 200 MG tablet Take 200 mg by mouth every 6 (six) hours as needed for moderate pain (for knee).    . isosorbide mononitrate (IMDUR) 30 MG 24 hr tablet Take 1 tablet (30 mg total) by mouth daily. 90 tablet 2  . metFORMIN (GLUCOPHAGE) 500 MG tablet Take 1,000 mg by mouth daily with breakfast.    . ondansetron (ZOFRAN) 8 MG  tablet Take 8 mg by mouth every 8 (eight) hours as needed for nausea or vomiting.    . potassium chloride SA (KLOR-CON) 20 MEQ tablet Take 1 tablet (20 mEq total) by mouth daily. 30 tablet 0  . predniSONE (DELTASONE) 20 MG tablet Take 1 tablet (20 mg total) by mouth daily as needed (arthiritis). 30 tablet 3  . predniSONE (DELTASONE) 5 MG tablet 6 tab x 2 days, 5 tab x 2 days, 4 tab x 2 days, 3 tab x 2 days, 2 tab x 2 days, 1 tab x 2 days, stop 42 tablet 0  . prochlorperazine (COMPAZINE) 10 MG tablet Take 1 tablet (10 mg total) by mouth every 6 (six) hours as needed (Nausea or vomiting). 30 tablet 1   No current facility-administered medications for this visit.   Facility-Administered Medications Ordered in Other Visits  Medication Dose Route Frequency Provider Last Rate Last Admin  . sodium chloride flush (NS) 0.9 % injection 10 mL  10 mL Intravenous PRN Irene Limbo, Cloria Spring, MD        REVIEW OF SYSTEMS:  A 10+ POINT REVIEW OF SYSTEMS WAS OBTAINED including neurology, dermatology, psychiatry, cardiac, respiratory, lymph, extremities, GI, GU, Musculoskeletal, constitutional, breasts, reproductive, HEENT.  All pertinent positives are noted in the HPI.  All others are negative.   PHYSICAL EXAMINATION: ECOG PERFORMANCE STATUS: 0 - Asymptomatic  VS reviewed - stable  GENERAL:alert, in no acute distress and comfortable SKIN: no acute rashes, no significant lesions EYES: conjunctiva are pink and non-injected, sclera anicteric OROPHARYNX: MMM, no exudates, no oropharyngeal erythema or ulceration NECK: supple, no JVD LYMPH:  no palpable lymphadenopathy in the cervical, axillary or inguinal regions LUNGS: clear to auscultation b/l with normal respiratory effort HEART: regular rate & rhythm ABDOMEN:  normoactive bowel sounds ,  non tender, not distended. No palpable hepatosplenomegaly.  Extremity: no pedal edema PSYCH: alert & oriented x 3 with fluent speech NEURO: no focal motor/sensory  deficits  LABORATORY DATA:  I have reviewed the data as listed  . CBC Latest Ref Rng & Units 04/30/2020 04/23/2020 04/16/2020  WBC 4.0 - 10.5 K/uL 8.2 7.8 12.1(H)  Hemoglobin 13.0 - 17.0 g/dL 9.8(L) 9.4(L) 8.8(L)  Hematocrit 39 - 52 % 32.0(L) 29.1(L) 27.1(L)  Platelets 150 - 400 K/uL 273 390 252    . CMP Latest Ref Rng & Units 04/30/2020 04/23/2020 04/16/2020  Glucose 70 - 99 mg/dL 298(H) 187(H) 257(H)  BUN 8 - 23 mg/dL $Remove'15 26 17  'PzjvZzc$ Creatinine 0.61 - 1.24 mg/dL 1.50(H) 1.46(H) 1.99(H)  Sodium 135 - 145 mmol/L 137 134 134(L)  Potassium 3.5 - 5.1 mmol/L 4.0 4.6 3.5  Chloride 98 - 111 mmol/L 107 103 103  CO2 22 - 32 mmol/L $RemoveB'22 20 23  'MUuRKUbh$ Calcium 8.9 - 10.3 mg/dL 9.3 9.4 9.4  Total Protein 6.5 - 8.1 g/dL 6.7 - 7.3  Total Bilirubin 0.3 - 1.2 mg/dL 0.5 - 1.3(H)  Alkaline Phos 38 - 126 U/L 85 - 58  AST 15 - 41 U/L 8(L) - 18  ALT 0 - 44 U/L 6 - 14   10/24/2019 FISH Panel:    10/24/2019 BM Bx Surgical Pathology:    RADIOGRAPHIC STUDIES: I have personally reviewed the radiological images as listed and agreed with the findings in the report. DG Chest 2 View  Result Date: 04/24/2020 CLINICAL DATA:  Follow-up pneumonia EXAM: CHEST - 2 VIEW COMPARISON:  03/14/2020 FINDINGS: Cardiac shadow is enlarged but stable. Postsurgical changes are again seen. Aortic calcifications are again noted. Lungs are well aerated bilaterally. Patchy airspace opacities are noted but continue to improved from the prior study. IMPRESSION: Persistent but improved airspace opacities bilaterally. Stable cardiomegaly. Electronically Signed   By: Inez Catalina M.D.   On: 04/24/2020 20:19    ASSESSMENT & PLAN:    64 yo with   1)  Newly diagnosed Multiple myeloma -09/27/19 M Protein at 3.4 -10/25/2019 PET/CT ([id number]) revealed "1. No specific myelomatous lesions are identified. 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  2) Anemia ? Related to CKD vs ? Plasma cell dyscrasia 3) CKD ?etiology - HTN ? Cardiac issues ? Myeloma related. 4) recently diagnosed small Pulmonary embolism PLAN: -Discussed pt labwork today, 04/30/20; Hgb is improved, PLT & WBC are nml, kidney function has improved, Glucose still very elevated.  -The pt has no prohibitive toxicities from continuing C7 Daratumumab/Dexamethasone at his time. -Advised pt that we will continue Daratumumab monthly starting from this cycle. Will continue until maximum response.  -Advised pt that we will repeat scans and BM Bx after maximum response is reached.  -Advised pt that we can consider continuing maintenance or transplant after VGPR. -Discussed CDC guidelines regarding the COVID19 booster. Will give in clinic today. -Recommend pt optimize control of HTN, Diabetes, & RA. F/u with PCP, Rheumatology, & Endocrinology. -  Continue 81 mg Aspirin -Will see back in 4 weeks with labs   FOLLOW UP: Covid booster vaccine today May cancel portflush appointments Plz schedule next 2 cycles of q4 weeks Daratumumab with labs and MD visits   The total time spent in the appt was 30 minutes and more than 50% was on counseling and direct patient cares, ordering and management of treatment  All of the patient's questions were answered with apparent satisfaction. The patient knows to call the clinic with any problems, questions or concerns.    Sullivan Lone MD [place] AAHIVMS [facility]  [facility] Hematology/Oncology Physician [facility]  (Office):       [phone] (Work cell):  [phone] (Fax):           [phone]  04/30/2020 10:20 AM  I, Yevette Edwards, am acting as a scribe for Dr. Sullivan Lone.   .I have reviewed the above documentation for accuracy and completeness, and I agree with the above. Brunetta Genera MD

## 2020-05-03 ENCOUNTER — Other Ambulatory Visit (INDEPENDENT_AMBULATORY_CARE_PROVIDER_SITE_OTHER): Payer: No Typology Code available for payment source

## 2020-05-03 ENCOUNTER — Other Ambulatory Visit: Payer: Self-pay

## 2020-05-03 DIAGNOSIS — E1165 Type 2 diabetes mellitus with hyperglycemia: Secondary | ICD-10-CM

## 2020-05-03 LAB — BASIC METABOLIC PANEL
BUN: 18 mg/dL (ref 6–23)
CO2: 24 mEq/L (ref 19–32)
Calcium: 8.8 mg/dL (ref 8.4–10.5)
Chloride: 104 mEq/L (ref 96–112)
Creatinine, Ser: 1.29 mg/dL (ref 0.40–1.50)
GFR: 58.46 mL/min — ABNORMAL LOW (ref >60.00)
Glucose, Bld: 352 mg/dL — ABNORMAL HIGH (ref 70–99)
Potassium: 3.8 mEq/L (ref 3.5–5.1)
Sodium: 136 mEq/L (ref 135–145)

## 2020-05-03 LAB — MULTIPLE MYELOMA PANEL, SERUM
Albumin SerPl Elph-Mcnc: 2.9 g/dL (ref 2.9–4.4)
Albumin/Glob SerPl: 1 (ref 0.7–1.7)
Alpha 1: 0.2 g/dL (ref 0.0–0.4)
Alpha2 Glob SerPl Elph-Mcnc: 1.1 g/dL — ABNORMAL HIGH (ref 0.4–1.0)
B-Globulin SerPl Elph-Mcnc: 0.8 g/dL (ref 0.7–1.3)
Gamma Glob SerPl Elph-Mcnc: 1 g/dL (ref 0.4–1.8)
Globulin, Total: 3.1 g/dL (ref 2.2–3.9)
IgA: 58 mg/dL — ABNORMAL LOW (ref 61–437)
IgG (Immunoglobin G), Serum: 1147 mg/dL (ref 603–1613)
IgM (Immunoglobulin M), Srm: 26 mg/dL (ref 20–172)
M Protein SerPl Elph-Mcnc: 0.5 g/dL — ABNORMAL HIGH
Total Protein ELP: 6 g/dL (ref 6.0–8.5)

## 2020-05-03 LAB — HEMOGLOBIN A1C: Hgb A1c MFr Bld: 9.7 % — ABNORMAL HIGH (ref 4.6–6.5)

## 2020-05-07 ENCOUNTER — Encounter: Payer: Self-pay | Admitting: Endocrinology

## 2020-05-07 ENCOUNTER — Ambulatory Visit (INDEPENDENT_AMBULATORY_CARE_PROVIDER_SITE_OTHER): Payer: No Typology Code available for payment source | Admitting: Endocrinology

## 2020-05-07 ENCOUNTER — Other Ambulatory Visit: Payer: Self-pay

## 2020-05-07 VITALS — BP 142/84 | HR 80 | Ht 72.0 in | Wt 164.0 lb

## 2020-05-07 DIAGNOSIS — E1165 Type 2 diabetes mellitus with hyperglycemia: Secondary | ICD-10-CM | POA: Diagnosis not present

## 2020-05-07 LAB — GLUCOSE, POCT (MANUAL RESULT ENTRY): POC Glucose: 194 mg/dl — AB (ref 70–99)

## 2020-05-07 MED ORDER — REPAGLINIDE 1 MG PO TABS: 1.0000 mg | ORAL_TABLET | Freq: Three times a day (TID) | ORAL | 1 refills | Status: DC

## 2020-05-07 NOTE — Patient Instructions (Signed)
Take Meformin 3x daily with each meal  NEW rX WILL Replace Glipizide: REPEGLANIDE  NEEDS TO be taken before each meal.  On the week of the infusion Take 2 pills before each meal of Repaglanide   Call the # on back of meter for get it working  Check blood sugars on waking up 3 days a week  Also check blood sugars about 2 hours after meals and do this after different meals by rotation  Recommended blood sugar levels on waking up are 90-130 and about 2 hours after meal is 130-160  Please bring your blood sugar monitor to each visit, thank you

## 2020-05-07 NOTE — Progress Notes (Signed)
Patient ID: [name], male   DOB: March 06, 1956, 64 y.o.   MRN: [id number]           Reason for Appointment: Follow-up for Type 2 Diabetes   History of Present Illness:          Date of diagnosis of type 2 diabetes mellitus:   2018      Background history:  He has never been told to have diabetes but review of his previous labs indicate that he had high blood sugar 179 in 2014 His highest sugar has been 234 in 10/2016 when his A1c was 8.8, not clear if he was symptomatic at that time He was recommended referral for diabetes at that time but he did not make an appointment  Recent history:   A1c is 9.7 compared to 8.1   Non-insulin hypoglycemic drugs the patient is taking are: Metformin 0.5 g daily  Current management, blood sugar patterns and problems identified:   He was advised to take glipizide ER 2.5 mg for at least 3 days on the days of his chemotherapy when he gets steroids but not clear if he is doing this or not  Also he supposed to take Metformin 500 mg twice daily but he is taking only 1 tablet daily  He said that his meter has error messages and he is not checking his blood sugars  Also his blood sugar on the week of his steroid infusion last week was 352  His weight has fluctuated  Today even 4 to 5 hours after eating his breakfast his blood sugar is still high at 194  He generally is eating some carbohydrate with every meal, sometimes with fatty meats  Not able to exercise  Renal function is better and previously has had limited his dose of Metformin            Glucose monitoring:  Rarely, has Accu-Chek guide meter  Blood sugars not available   Dietician visit, most recent: 2/21  Weight history:  Wt Readings from Last 3 Encounters:  05/07/20 164 lb (74.4 kg)  04/30/20 165 lb 6.4 oz (75 kg)  04/23/20 173 lb 6.4 oz (78.7 kg)    Glycemic control:   Lab Results  Component Value Date   HGBA1C 9.7 (H) 05/03/2020   HGBA1C 8.1 (H) 04/05/2020    HGBA1C 8.1 (H) 03/14/2020   Lab Results  Component Value Date   MICROALBUR 12.6 (H) 07/21/2018   LDLCALC 39 09/04/2019   CREATININE 1.29 05/03/2020   Lab Results  Component Value Date   MICRALBCREAT 10.0 07/21/2018    Lab Results  Component Value Date   FRUCTOSAMINE 345 (H) 04/05/2020   FRUCTOSAMINE 322 (H) 02/02/2020   FRUCTOSAMINE 311 (H) 11/20/2019    Office Visit on 05/07/2020  Component Date Value Ref Range Status  . POC Glucose 05/07/2020 194* 70 - 99 mg/dl Final  Lab on 05/03/2020  Component Date Value Ref Range Status  . Sodium 05/03/2020 136  135 - 145 mEq/L Final  . Potassium 05/03/2020 3.8  3.5 - 5.1 mEq/L Final  . Chloride 05/03/2020 104  96 - 112 mEq/L Final  . CO2 05/03/2020 24  19 - 32 mEq/L Final  . Glucose, Bld 05/03/2020 352* 70 - 99 mg/dL Final  . BUN 05/03/2020 18  6 - 23 mg/dL Final  . Creatinine, Ser 05/03/2020 1.29  0.40 - 1.50 mg/dL Final  . GFR 05/03/2020 58.46* >60.00 mL/min Final  . Calcium 05/03/2020 8.8  8.4 - 10.5 mg/dL  Final  . Hgb A1c MFr Bld 05/03/2020 9.7* 4.6 - 6.5 % Final   Glycemic Control Guidelines for People with Diabetes:Non Diabetic:  <6%Goal of Therapy: <7%Additional Action Suggested:  >8%     Allergies as of 05/07/2020   No Known Allergies     Medication List       Accurate as of May 07, 2020  8:28 PM. If you have any questions, ask your nurse or doctor.        Accu-Chek Guide Me w/Device Kit 1 each by Does not apply route.   Accu-Chek Guide test strip Generic drug: glucose blood Use Accu Chek test strips as instructed to check blood sugar fasting or two hours after meals, every other day.   Accu-Chek Softclix Lancets lancets Use Accu Chek softclix lancets to check blood sugar fasting or 2 hours after a meal every other day.   acyclovir 400 MG tablet Commonly known as: ZOVIRAX Take 0.5 tablets (200 mg total) by mouth 2 (two) times daily.   amLODipine 5 MG tablet Commonly known as: NORVASC Take 5 mg by  mouth daily.   aspirin EC 81 MG tablet Take 81 mg by mouth daily. Swallow whole.   atorvastatin 80 MG tablet Commonly known as: LIPITOR Take 80 mg by mouth daily.   carvedilol 25 MG tablet Commonly known as: COREG Take 25 mg by mouth 2 (two) times daily with a meal.   dexamethasone 4 MG tablet Commonly known as: DECADRON Take 12 mg by mouth See admin instructions. Take after chemo treatments   hydrALAZINE 50 MG tablet Commonly known as: APRESOLINE Take 50 mg by mouth 3 (three) times daily.   ibuprofen 200 MG tablet Commonly known as: ADVIL Take 200 mg by mouth every 6 (six) hours as needed for moderate pain (for knee).   isosorbide mononitrate 30 MG 24 hr tablet Commonly known as: IMDUR Take 1 tablet (30 mg total) by mouth daily.   metFORMIN 500 MG tablet Commonly known as: GLUCOPHAGE Take 1,000 mg by mouth daily with breakfast.   ondansetron 8 MG tablet Commonly known as: ZOFRAN Take 8 mg by mouth every 8 (eight) hours as needed for nausea or vomiting.   potassium chloride SA 20 MEQ tablet Commonly known as: KLOR-CON Take 1 tablet (20 mEq total) by mouth daily.   predniSONE 5 MG tablet Commonly known as: DELTASONE 6 tab x 2 days, 5 tab x 2 days, 4 tab x 2 days, 3 tab x 2 days, 2 tab x 2 days, 1 tab x 2 days, stop   predniSONE 20 MG tablet Commonly known as: DELTASONE Take 1 tablet (20 mg total) by mouth daily as needed (arthiritis).   prochlorperazine 10 MG tablet Commonly known as: COMPAZINE Take 1 tablet (10 mg total) by mouth every 6 (six) hours as needed (Nausea or vomiting).   repaglinide 1 MG tablet Commonly known as: Prandin Take 1 tablet (1 mg total) by mouth 3 (three) times daily before meals. Started by: Elayne Snare, MD       Allergies: No Known Allergies  Past Medical History:  Diagnosis Date  . Anemia   . Arthritis    "left knee" (11/24/2017)  . CAD (coronary artery disease)    CABG 2002  . Cancer [facility])    Multiple Myeloma  . CHF  (congestive heart failure) ([place])   . Chronic kidney disease   . Chronic renal insufficiency   . Diabetes mellitus without complication ([place])   . Dilated cardiomyopathy ([place])  echo in 2009 showed improvement with a normal EF  . HTN (hypertension)   . Hyperlipemia   . Hypertension   . Noncompliance   . PAD (peripheral artery disease) ([place])   . Pneumonia     Past Surgical History:  Procedure Laterality Date  . CARDIAC CATHETERIZATION  2002, 2006  . CORONARY ARTERY BYPASS GRAFT  2002   x4 DR. VAN TRIGT  . IR FLUORO GUIDED NEEDLE PLC ASPIRATION/INJECTION LOC  10/24/2019    Family History  Problem Relation Age of Onset  . Hypertension Father   . Diabetes Mother        DIABETIC COMA    Social History:  reports that he has never smoked. He has never used smokeless tobacco. He reports previous alcohol use. He reports previous drug use.   Review of Systems   Lipid history: On atorvastatin 80 mg since diagnosis of CAD, labs as follows    Lab Results  Component Value Date   CHOL 92 (L) 09/04/2019   HDL 35 (L) 09/04/2019   LDLCALC 39 09/04/2019   LDLDIRECT 149.4 01/07/2011   TRIG 89 09/04/2019   CHOLHDL 2.6 09/04/2019           Hypertension: Has been followed by cardiologist for blood pressure management Recent readings:  BP Readings from Last 3 Encounters:  05/07/20 (!) 142/84  04/30/20 (!) 131/59  04/30/20 (!) 163/87   RENAL dysfunction is improving  Renal dysfunction not related to diabetes  No history of proteinuria  Lab Results  Component Value Date   CREATININE 1.29 05/03/2020   CREATININE 1.50 (H) 04/30/2020   CREATININE 1.46 (H) 04/23/2020    Most recent eye exam was in 2018  Most recent foot exam: 07/2018  Currently known complications of diabetes:none  LABS:  Office Visit on 05/07/2020  Component Date Value Ref Range Status  . POC Glucose 05/07/2020 194* 70 - 99 mg/dl Final  Lab on 05/03/2020  Component Date Value Ref Range Status  .  Sodium 05/03/2020 136  135 - 145 mEq/L Final  . Potassium 05/03/2020 3.8  3.5 - 5.1 mEq/L Final  . Chloride 05/03/2020 104  96 - 112 mEq/L Final  . CO2 05/03/2020 24  19 - 32 mEq/L Final  . Glucose, Bld 05/03/2020 352* 70 - 99 mg/dL Final  . BUN 05/03/2020 18  6 - 23 mg/dL Final  . Creatinine, Ser 05/03/2020 1.29  0.40 - 1.50 mg/dL Final  . GFR 05/03/2020 58.46* >60.00 mL/min Final  . Calcium 05/03/2020 8.8  8.4 - 10.5 mg/dL Final  . Hgb A1c MFr Bld 05/03/2020 9.7* 4.6 - 6.5 % Final   Glycemic Control Guidelines for People with Diabetes:Non Diabetic:  <6%Goal of Therapy: <7%Additional Action Suggested:  >8%     Physical Examination:  BP (!) 142/84   Pulse 80   Ht 6' (1.829 m)   Wt 164 lb (74.4 kg)   SpO2 96%   BMI 22.24 kg/m          ASSESSMENT:  Diabetes type 2, mild  See history of present illness for detailed discussion of current diabetes management, blood sugar patterns and problems identified  A1c has gone up higher than usual at 9.7  He apparently is taking only Metformin 500 mg daily He was supposed to be on glipizide at least for 3 days of his steroid infusion doses but this is not on his medication list History is again difficult to obtain accurately Again not checking blood sugars much and does not  know what his readings are He appears to have at least postprandial hyperglycemia fairly significantly even today when he has not had any steroids for about a week  Diet is usually balanced but occasionally higher fat, usually getting 3 meals with carbohydrates   PLAN:    1. Glucose monitoring: He will need to call the toll-free number to get support for his malfunctioning glucose meter If need be we can prescribe a new one or check to see if he has nodules the proper technique To bring monitor for each visit Discussed when to check his blood sugars and targets Also discussed that without getting regular blood sugar monitoring difficult to adjust his  treatment  2.    Medication recommendations Increase Metformin to 3 times a day with every meal, will adjust further if any renal dysfunction occurs Trial of Prandin 1 mg with every meal which should reduce chances of hypoglycemia because of short duration of action This will be doubled on the days he has chemotherapy for 3 days that week  Reviewed his written instructions on the visit summary  Follow-up in 1 month   Patient Instructions  Take Meformin 3x daily with each meal  NEW rX WILL Replace Glipizide: REPEGLANIDE  NEEDS TO be taken before each meal.  On the week of the infusion Take 2 pills before each meal of Repaglanide   Call the # on back of meter for get it working  Check blood sugars on waking up 3 days a week  Also check blood sugars about 2 hours after meals and do this after different meals by rotation  Recommended blood sugar levels on waking up are 90-130 and about 2 hours after meal is 130-160  Please bring your blood sugar monitor to each visit, thank you          Elayne Snare 05/07/2020, 8:28 PM   Note: This office note was prepared with Dragon voice recognition system technology. Any transcriptional errors that result from this process are unintentional.   Elayne Snare

## 2020-05-12 ENCOUNTER — Other Ambulatory Visit: Payer: Self-pay | Admitting: Endocrinology

## 2020-05-16 ENCOUNTER — Telehealth (INDEPENDENT_AMBULATORY_CARE_PROVIDER_SITE_OTHER): Payer: No Typology Code available for payment source | Admitting: Family Medicine

## 2020-05-16 ENCOUNTER — Encounter: Payer: Self-pay | Admitting: Family Medicine

## 2020-05-16 DIAGNOSIS — R197 Diarrhea, unspecified: Secondary | ICD-10-CM

## 2020-05-16 NOTE — Progress Notes (Signed)
Virtual Visit via Video Note  I connected with [name]  on 05/16/20 at  3:00 PM EDT by a video enabled telemedicine application and verified that I am speaking with the correct person using two identifiers.  Location patient: home, [place] Location provider:work or home office Persons participating in the virtual visit: patient, provider  I discussed the limitations of evaluation and management by telemedicine and the availability of in person appointments. The patient expressed understanding and agreed to proceed.   HPI:  Acute telemedicine visit for Diarrhea: -Onset: 2 days ago - initially with many BM daily and abd pain -Symptoms include: 3 episodes of runny stool today  -Denies: abd pain today, melena, hematochezia, fever, vomiting, resp symptoms -able to eat and drink well and feels is doing better today -no abx, travel or sick contacts -Has tried: none -Pertinent past medical history: denies any history of bowel disease -Pertinent medication allergies:nkda -COVID-19 vaccine status: fully vaccinated and had booster  ROS: See pertinent positives and negatives per HPI.  Past Medical History:  Diagnosis Date   Anemia    Arthritis    "left knee" (11/24/2017)   CAD (coronary artery disease)    CABG 2002   Cancer [facility]    Multiple Myeloma   CHF (congestive heart failure) ([facility])    Chronic kidney disease    Chronic renal insufficiency    Diabetes mellitus without complication ([place])    Dilated cardiomyopathy ([place])    echo in 2009 showed improvement with a normal EF   HTN (hypertension)    Hyperlipemia    Hypertension    Noncompliance    PAD (peripheral artery disease) ([place])    Pneumonia     Past Surgical History:  Procedure Laterality Date   CARDIAC CATHETERIZATION  2002, 2006   CORONARY ARTERY BYPASS GRAFT  2002   x4 DR. VAN TRIGT   IR FLUORO GUIDED NEEDLE PLC ASPIRATION/INJECTION LOC  10/24/2019     Current Outpatient Medications:    Accu-Chek  Softclix Lancets lancets, Use Accu Chek softclix lancets to check blood sugar fasting or 2 hours after a meal every other day., Disp: 100 each, Rfl: 2   acyclovir (ZOVIRAX) 400 MG tablet, Take 0.5 tablets (200 mg total) by mouth 2 (two) times daily., Disp: 60 tablet, Rfl: 11   amLODipine (NORVASC) 5 MG tablet, Take 5 mg by mouth daily., Disp: , Rfl:    aspirin EC 81 MG tablet, Take 81 mg by mouth daily. Swallow whole., Disp: , Rfl:    atorvastatin (LIPITOR) 80 MG tablet, Take 80 mg by mouth daily., Disp: , Rfl:    Blood Glucose Monitoring Suppl (ACCU-CHEK GUIDE ME) w/Device KIT, 1 each by Does not apply route., Disp: , Rfl:    carvedilol (COREG) 25 MG tablet, Take 25 mg by mouth 2 (two) times daily with a meal., Disp: , Rfl:    dexamethasone (DECADRON) 4 MG tablet, Take 12 mg by mouth See admin instructions. Take after chemo treatments, Disp: , Rfl:    glucose blood (ACCU-CHEK GUIDE) test strip, Use Accu Chek test strips as instructed to check blood sugar fasting or two hours after meals, every other day., Disp: 100 each, Rfl: 2   hydrALAZINE (APRESOLINE) 50 MG tablet, Take 50 mg by mouth 3 (three) times daily., Disp: , Rfl:    ibuprofen (ADVIL,MOTRIN) 200 MG tablet, Take 200 mg by mouth every 6 (six) hours as needed for moderate pain (for knee)., Disp: , Rfl:    isosorbide mononitrate (IMDUR) 30 MG  24 hr tablet, Take 1 tablet (30 mg total) by mouth daily., Disp: 90 tablet, Rfl: 2   metFORMIN (GLUCOPHAGE) 500 MG tablet, Take 1,000 mg by mouth daily with breakfast., Disp: , Rfl:    ondansetron (ZOFRAN) 8 MG tablet, Take 8 mg by mouth every 8 (eight) hours as needed for nausea or vomiting., Disp: , Rfl:    potassium chloride SA (KLOR-CON) 20 MEQ tablet, Take 1 tablet by mouth once daily, Disp: 30 tablet, Rfl: 3   predniSONE (DELTASONE) 20 MG tablet, Take 1 tablet (20 mg total) by mouth daily as needed (arthiritis)., Disp: 30 tablet, Rfl: 3   prochlorperazine (COMPAZINE) 10 MG tablet,  Take 1 tablet (10 mg total) by mouth every 6 (six) hours as needed (Nausea or vomiting)., Disp: 30 tablet, Rfl: 1   repaglinide (PRANDIN) 1 MG tablet, Take 1 tablet (1 mg total) by mouth 3 (three) times daily before meals., Disp: 90 tablet, Rfl: 1 No current facility-administered medications for this visit.  Facility-Administered Medications Ordered in Other Visits:    sodium chloride flush (NS) 0.9 % injection 10 mL, 10 mL, Intravenous, PRN, Irene Limbo, Cloria Spring, MD  EXAM:  VITALS per patient if applicable:  GENERAL: alert, oriented, appears well and in no acute distress  HEENT: atraumatic, conjunttiva clear, no obvious abnormalities on inspection of external nose and ears  NECK: normal movements of the head and neck  LUNGS: on inspection no signs of respiratory distress, breathing rate appears normal, no obvious gross SOB, gasping or wheezing  CV: no obvious cyanosis  MS: moves all visible extremities without noticeable abnormality  PSYCH/NEURO: pleasant and cooperative, no obvious depression or anxiety, speech and thought processing grossly intact  ASSESSMENT AND PLAN:  Discussed the following assessment and plan:  Diarrhea, unspecified type  -we discussed possible serious and likely etiologies, options for evaluation and workup, limitations of telemedicine visit vs in person visit, treatment, treatment risks and precautions. Pt prefers to treat via telemedicine empirically rather than in person at this moment.  Discussed potential etiologies, possible mild gastroenteritis given is feeling better today.  He opted to try Imodium, avoidance of dairy, bland diet and oral hydration.  Discussed possibility of Covid, though think that is less likely given he is fully vaccinated and had his booster.  Discussed options for testing, precautions, potential complications and options for in person evaluation if any worsening or if is not improving. Work/School slipped offered:  declined Scheduled follow up with PCP offered:  agrees to follow-up if needed e to schedule if does not receive call back in next 24 hours. Advised to seek prompt in person care if worsening, new symptoms arise, or if is not improving with treatment. Discussed options for inperson care if PCP office not available. Did let this patient know that I only do telemedicine on Tuesdays and Thursdays for [place]. Advised to schedule follow up visit with PCP or [facility] if any further questions or concerns to avoid delays in care.   I discussed the assessment and treatment plan with the patient. The patient was provided an opportunity to ask questions and all were answered. The patient agreed with the plan and demonstrated an understanding of the instructions.     Lucretia Kern, DO

## 2020-05-16 NOTE — Patient Instructions (Signed)
-  imodium if needed for diarrhea per instructions  -plenty of fluids and a bland diet  -no dairy or red meat for 1 week  -stay home while sick, and if you have COVID19 please stay home for a full 10 days since the onset of symptoms PLUS one day of no fever and feeling better  -Bright COVID19 testing information: https://www.[facility].org/ [place] [phone]    It was nice to meet you today, and I really hope you are feeling better soon. I help [facility] out with telemedicine visits on Tuesdays and Thursdays and am available for visits on those days. If you have any concerns or questions following this visit please schedule a follow up visit with your Primary Care doctor or seek care at a local urgent care clinic to avoid delays in care.    Seek in person care promptly if your symptoms worsen, new concerns arise or you are not improving with treatment. Call 911 and/or seek emergency care if you symptoms are severe or life threatening.

## 2020-05-24 ENCOUNTER — Other Ambulatory Visit: Payer: Self-pay

## 2020-05-24 ENCOUNTER — Ambulatory Visit
Admission: RE | Admit: 2020-05-24 | Discharge: 2020-05-24 | Disposition: A | Discharge status: Home / Self Care | Payer: No Typology Code available for payment source | Source: Ambulatory Visit | Attending: Nurse Practitioner | Admitting: Nurse Practitioner

## 2020-05-24 DIAGNOSIS — R918 Other nonspecific abnormal finding of lung field: Secondary | ICD-10-CM

## 2020-05-28 ENCOUNTER — Other Ambulatory Visit (INDEPENDENT_AMBULATORY_CARE_PROVIDER_SITE_OTHER): Payer: No Typology Code available for payment source

## 2020-05-28 ENCOUNTER — Other Ambulatory Visit: Payer: Self-pay

## 2020-05-28 DIAGNOSIS — E1165 Type 2 diabetes mellitus with hyperglycemia: Secondary | ICD-10-CM

## 2020-05-28 LAB — BASIC METABOLIC PANEL
BUN: 14 mg/dL (ref 6–23)
CO2: 27 mEq/L (ref 19–32)
Calcium: 9.3 mg/dL (ref 8.4–10.5)
Chloride: 106 mEq/L (ref 96–112)
Creatinine, Ser: 1.63 mg/dL — ABNORMAL HIGH (ref 0.40–1.50)
GFR: 44.13 mL/min — ABNORMAL LOW (ref >60.00)
Glucose, Bld: 177 mg/dL — ABNORMAL HIGH (ref 70–99)
Potassium: 4 mEq/L (ref 3.5–5.1)
Sodium: 140 mEq/L (ref 135–145)

## 2020-05-29 ENCOUNTER — Inpatient Hospital Stay (HOSPITAL_BASED_OUTPATIENT_CLINIC_OR_DEPARTMENT_OTHER): Payer: PRIVATE HEALTH INSURANCE | Admitting: Hematology

## 2020-05-29 ENCOUNTER — Inpatient Hospital Stay: Payer: PRIVATE HEALTH INSURANCE | Attending: Hematology

## 2020-05-29 ENCOUNTER — Inpatient Hospital Stay: Payer: PRIVATE HEALTH INSURANCE

## 2020-05-29 ENCOUNTER — Other Ambulatory Visit: Payer: Self-pay

## 2020-05-29 VITALS — BP 129/62 | HR 72 | Temp 98.2°F | Resp 18

## 2020-05-29 VITALS — BP 133/77 | HR 89 | Temp 99.2°F | Resp 18 | Ht 72.0 in | Wt 164.6 lb

## 2020-05-29 DIAGNOSIS — D508 Other iron deficiency anemias: Secondary | ICD-10-CM

## 2020-05-29 DIAGNOSIS — C9000 Multiple myeloma not having achieved remission: Secondary | ICD-10-CM

## 2020-05-29 DIAGNOSIS — M1990 Unspecified osteoarthritis, unspecified site: Secondary | ICD-10-CM | POA: Insufficient documentation

## 2020-05-29 DIAGNOSIS — E785 Hyperlipidemia, unspecified: Secondary | ICD-10-CM | POA: Insufficient documentation

## 2020-05-29 DIAGNOSIS — N189 Chronic kidney disease, unspecified: Secondary | ICD-10-CM | POA: Insufficient documentation

## 2020-05-29 DIAGNOSIS — Z79899 Other long term (current) drug therapy: Secondary | ICD-10-CM | POA: Diagnosis not present

## 2020-05-29 DIAGNOSIS — I10 Essential (primary) hypertension: Secondary | ICD-10-CM | POA: Diagnosis not present

## 2020-05-29 DIAGNOSIS — D649 Anemia, unspecified: Secondary | ICD-10-CM | POA: Diagnosis not present

## 2020-05-29 DIAGNOSIS — Z7189 Other specified counseling: Secondary | ICD-10-CM

## 2020-05-29 DIAGNOSIS — Z5112 Encounter for antineoplastic immunotherapy: Secondary | ICD-10-CM | POA: Diagnosis not present

## 2020-05-29 DIAGNOSIS — I509 Heart failure, unspecified: Secondary | ICD-10-CM | POA: Diagnosis not present

## 2020-05-29 DIAGNOSIS — I2510 Atherosclerotic heart disease of native coronary artery without angina pectoris: Secondary | ICD-10-CM | POA: Insufficient documentation

## 2020-05-29 DIAGNOSIS — I739 Peripheral vascular disease, unspecified: Secondary | ICD-10-CM | POA: Diagnosis not present

## 2020-05-29 DIAGNOSIS — I420 Dilated cardiomyopathy: Secondary | ICD-10-CM | POA: Diagnosis not present

## 2020-05-29 DIAGNOSIS — Z23 Encounter for immunization: Secondary | ICD-10-CM

## 2020-05-29 DIAGNOSIS — E119 Type 2 diabetes mellitus without complications: Secondary | ICD-10-CM | POA: Diagnosis not present

## 2020-05-29 DIAGNOSIS — Z951 Presence of aortocoronary bypass graft: Secondary | ICD-10-CM | POA: Insufficient documentation

## 2020-05-29 DIAGNOSIS — I2699 Other pulmonary embolism without acute cor pulmonale: Secondary | ICD-10-CM | POA: Insufficient documentation

## 2020-05-29 LAB — CMP (CANCER CENTER ONLY)
ALT: <6 U/L (ref 0–44)
AST: 8 U/L — ABNORMAL LOW (ref 15–41)
Albumin: 2.8 g/dL — ABNORMAL LOW (ref 3.5–5.0)
Alkaline Phosphatase: 77 U/L (ref 38–126)
Anion gap: 10 (ref 5–15)
BUN: 16 mg/dL (ref 8–23)
CO2: 22 mmol/L (ref 22–32)
Calcium: 9 mg/dL (ref 8.9–10.3)
Chloride: 110 mmol/L (ref 98–111)
Creatinine: 1.54 mg/dL — ABNORMAL HIGH (ref 0.61–1.24)
GFR, Estimated: 50 mL/min — ABNORMAL LOW (ref >60)
Glucose, Bld: 104 mg/dL — ABNORMAL HIGH (ref 70–99)
Potassium: 3.4 mmol/L — ABNORMAL LOW (ref 3.5–5.1)
Sodium: 142 mmol/L (ref 135–145)
Total Bilirubin: 0.5 mg/dL (ref 0.3–1.2)
Total Protein: 6.8 g/dL (ref 6.5–8.1)

## 2020-05-29 LAB — CBC WITH DIFFERENTIAL/PLATELET
Abs Immature Granulocytes: 0.03 10*3/uL (ref 0.00–0.07)
Basophils Absolute: 0 10*3/uL (ref 0.0–0.1)
Basophils Relative: 0 %
Eosinophils Absolute: 0.1 10*3/uL (ref 0.0–0.5)
Eosinophils Relative: 2 %
HCT: 27.6 % — ABNORMAL LOW (ref 39.0–52.0)
Hemoglobin: 8.8 g/dL — ABNORMAL LOW (ref 13.0–17.0)
Immature Granulocytes: 0 %
Lymphocytes Relative: 21 %
Lymphs Abs: 1.5 10*3/uL (ref 0.7–4.0)
MCH: 25.1 pg — ABNORMAL LOW (ref 26.0–34.0)
MCHC: 31.9 g/dL (ref 30.0–36.0)
MCV: 78.6 fL — ABNORMAL LOW (ref 80.0–100.0)
Monocytes Absolute: 0.7 10*3/uL (ref 0.1–1.0)
Monocytes Relative: 10 %
Neutro Abs: 4.6 10*3/uL (ref 1.7–7.7)
Neutrophils Relative %: 67 %
Platelets: 351 10*3/uL (ref 150–400)
RBC: 3.51 MIL/uL — ABNORMAL LOW (ref 4.22–5.81)
RDW: 17.5 % — ABNORMAL HIGH (ref 11.5–15.5)
WBC: 6.9 10*3/uL (ref 4.0–10.5)
nRBC: 0 % (ref 0.0–0.2)

## 2020-05-29 LAB — FRUCTOSAMINE: Fructosamine: 327 umol/L — ABNORMAL HIGH (ref 0–285)

## 2020-05-29 MED ORDER — ACETAMINOPHEN 325 MG PO TABS
650.0000 mg | ORAL_TABLET | Freq: Once | ORAL | Status: AC
  Administered 2020-05-29: 650 mg via ORAL

## 2020-05-29 MED ORDER — METHYLPREDNISOLONE SODIUM SUCC 125 MG IJ SOLR
100.0000 mg | Freq: Once | INTRAMUSCULAR | Status: AC
  Administered 2020-05-29: 100 mg via INTRAVENOUS

## 2020-05-29 MED ORDER — DIPHENHYDRAMINE HCL 25 MG PO CAPS
50.0000 mg | ORAL_CAPSULE | Freq: Once | ORAL | Status: AC
  Administered 2020-05-29: 50 mg via ORAL

## 2020-05-29 MED ORDER — SODIUM CHLORIDE 0.9 % IV SOLN
Freq: Once | INTRAVENOUS | Status: AC
  Filled 2020-05-29: qty 250

## 2020-05-29 MED ORDER — MEPERIDINE HCL 25 MG/ML IJ SOLN
INTRAMUSCULAR | Status: AC
  Filled 2020-05-29: qty 1

## 2020-05-29 MED ORDER — ACETAMINOPHEN 325 MG PO TABS
ORAL_TABLET | ORAL | Status: AC
  Filled 2020-05-29: qty 2

## 2020-05-29 MED ORDER — MONTELUKAST SODIUM 10 MG PO TABS
ORAL_TABLET | ORAL | Status: AC
  Filled 2020-05-29: qty 1

## 2020-05-29 MED ORDER — MEPERIDINE HCL 25 MG/ML IJ SOLN
25.0000 mg | Freq: Once | INTRAMUSCULAR | Status: AC
  Administered 2020-05-29: 25 mg via INTRAVENOUS

## 2020-05-29 MED ORDER — FAMOTIDINE IN NACL 20-0.9 MG/50ML-% IV SOLN
INTRAVENOUS | Status: AC
  Filled 2020-05-29: qty 50

## 2020-05-29 MED ORDER — SODIUM CHLORIDE 0.9 % IV SOLN
40.0000 mg | Freq: Once | INTRAVENOUS | Status: AC
  Administered 2020-05-29: 40 mg via INTRAVENOUS
  Filled 2020-05-29: qty 4

## 2020-05-29 MED ORDER — METHYLPREDNISOLONE SODIUM SUCC 125 MG IJ SOLR
INTRAMUSCULAR | Status: AC
  Filled 2020-05-29: qty 2

## 2020-05-29 MED ORDER — SODIUM CHLORIDE 0.9 % IV SOLN
16.0000 mg/kg | Freq: Once | INTRAVENOUS | Status: AC
  Administered 2020-05-29: 1300 mg via INTRAVENOUS
  Filled 2020-05-29: qty 60

## 2020-05-29 MED ORDER — DIPHENHYDRAMINE HCL 25 MG PO CAPS
ORAL_CAPSULE | ORAL | Status: AC
  Filled 2020-05-29: qty 2

## 2020-05-29 MED ORDER — MONTELUKAST SODIUM 10 MG PO TABS
10.0000 mg | ORAL_TABLET | Freq: Once | ORAL | Status: AC
  Administered 2020-05-29: 10 mg via ORAL

## 2020-05-29 NOTE — Progress Notes (Signed)
HEMATOLOGY/ONCOLOGY CLINIC NOTE  Date of Service: 05/29/2020  Patient Care Team: Isaac Bliss, Rayford Halsted, MD as PCP - General (Internal Medicine) Martinique, Peter M, MD as PCP - Cardiology (Cardiology) Isaac Bliss, Rayford Halsted, MD (Internal Medicine)  CHIEF COMPLAINTS/PURPOSE OF CONSULTATION:  Continued mx of myeloma  HISTORY OF PRESENTING ILLNESS:  [name] is a wonderful 64 y.o. male who has been referred to [place] by Dr Servando Snare for evaluation and management of elevated protein. Pt is accompanied today by his wife, [name]. The pt reports that he is doing well overall.    The pt reports he was first told that he had Diabetes two years ago. He has not previously needed to be on medications for his diabetes but has recently been started on Januvia by Dr. Elayne Snare, his Endocrinologist. His blood glucose was 149 this morning. He sees Burtis Junes - NP and Dr. Martinique for Cardiology. Pt has CAD and had to have open heart surgery 20 years ago. He also has HTN that he feels has been stable. His Cardiology team is currently managing his heart medications and recently told pt to discontinue taking Furosemide due kidney function on last labs. Dr. Martinique and Truitt Merle have been essentially functioning as his primary care, as he does not have a PCP at this time. He has an upcoming appointment with Truitt Merle in the beginning of May and Dr. [name] next Tuesday. Pt is currently using Ibuprofen a couple of times per week for his knee pain.   Pt has felt the same in the last 3-6 months and denies any new bone pain of any kinds. He is eating, functioning, and moving about as normal. He has lost about 13 lbs over the last few months. Pt does not smoke and does not drink much alcohol.    Most recent lab results (09/04/2019) of CBC, BMP and Hepatic function panel is as follows: all values are WNL except for RBC at 3.64, Hgb at 9.8, HCT at 29.8, Glucose at 129, BUN at 64, Creatinine  at 2.62, GFR Est Af Am at 29, CO2 at 15, Total Protein at 10.0, Albumin at 3.6, ALP at 163, ALT at 45.    On review of systems, pt reports unexpected weight loss, left knee pain and denies new back pain, new shoulder pain, new hip pain, chest pain, SOB, leg swelling, testicular pain/swelling and any other symptoms.    On PMHx the pt reports Left Knee Arthritis, CAD, CHF, Type II Diabetes, HTN, HLD, Cardiac Catheterization, Coronary Artery Bypass Graft x4 (2002). On Social Hx the pt reports that he is a non-smoker and does not drink much alcohol.  INTERVAL HISTORY:   [name] is a wonderful 64 y.o. male is here for evaluation and management of newly diagnosed myeloma. He is here for C8D1 Daratumumab. The patient's last visit with [place] was on 04/30/2020. The pt reports that he is doing well overall.  The pt reports that he continues to experience significant joint pain and swelling in his left hand. He notes that his Diabetes has been well-controlled. Pt has a plan with Dr. [name] to control his blood glucose levels after each treatment. He continues taking 81 mg Aspirin, but discontinued Eliquis after one month. Pt has no history of neuropathy. Pt has received his COVID19 booster, but not his flu vaccine.   Lab results today (05/29/20) of CBC w/diff and CMP is as follows: all values are WNL except for RBC at  3.51, Hgb at 8.8, HCT at 27.6, MCV at 78.6, MCH at 25.1, RDW at 17.5, Potassium at 3.4, Glucose at 104, Creatinine at 1.54, Albumin at 2.8, AST at 8, GFR Est at 50. 05/29/2020 MMP is in progress  On review of systems, pt reports left hand swelling/joint pain and denies abdominal pain, tingling/numbness in fingers/toes and any other symptoms.   MEDICAL HISTORY:  Past Medical History:  Diagnosis Date  . Anemia   . Arthritis    "left knee" (11/24/2017)  . CAD (coronary artery disease)    CABG 2002  . Cancer [facility])    Multiple Myeloma  . CHF (congestive heart failure) ([place])   .  Chronic kidney disease   . Chronic renal insufficiency   . Diabetes mellitus without complication ([place])   . Dilated cardiomyopathy ([place])    echo in 2009 showed improvement with a normal EF  . HTN (hypertension)   . Hyperlipemia   . Hypertension   . Noncompliance   . PAD (peripheral artery disease) ([place])   . Pneumonia     SURGICAL HISTORY: Past Surgical History:  Procedure Laterality Date  . CARDIAC CATHETERIZATION  2002, 2006  . CORONARY ARTERY BYPASS GRAFT  2002   x4 DR. VAN TRIGT  . IR FLUORO GUIDED NEEDLE PLC ASPIRATION/INJECTION LOC  10/24/2019    SOCIAL HISTORY: Social History   Socioeconomic History  . Marital status: Married    Spouse name: Not on file  . Number of children: 1  . Years of education: Not on file  . Highest education level: Not on file  Occupational History  . Occupation: Furniture conservator/restorer  Tobacco Use  . Smoking status: Never Smoker  . Smokeless tobacco: Never Used  Vaping Use  . Vaping Use: Never used  Substance and Sexual Activity  . Alcohol use: Not Currently  . Drug use: Not Currently  . Sexual activity: Yes  Other Topics Concern  . Not on file  Social History Narrative   ** Merged History Encounter **       Social Determinants of Health   Financial Resource Strain:   . Difficulty of Paying Living Expenses: Not on file  Food Insecurity:   . Worried About Charity fundraiser in the Last Year: Not on file  . Ran Out of Food in the Last Year: Not on file  Transportation Needs:   . Lack of Transportation (Medical): Not on file  . Lack of Transportation (Non-Medical): Not on file  Physical Activity:   . Days of Exercise per Week: Not on file  . Minutes of Exercise per Session: Not on file  Stress:   . Feeling of Stress : Not on file  Social Connections:   . Frequency of Communication with Friends and Family: Not on file  . Frequency of Social Gatherings with Friends and Family: Not on file  . Attends Religious Services: Not on file  .  Active Member of Clubs or Organizations: Not on file  . Attends Archivist Meetings: Not on file  . Marital Status: Not on file  Intimate Partner Violence:   . Fear of Current or Ex-Partner: Not on file  . Emotionally Abused: Not on file  . Physically Abused: Not on file  . Sexually Abused: Not on file    FAMILY HISTORY: Family History  Problem Relation Age of Onset  . Hypertension Father   . Diabetes Mother        DIABETIC COMA    ALLERGIES:  has No  Known Allergies.  MEDICATIONS:  Current Outpatient Medications  Medication Sig Dispense Refill  . Accu-Chek Softclix Lancets lancets Use Accu Chek softclix lancets to check blood sugar fasting or 2 hours after a meal every other day. 100 each 2  . acyclovir (ZOVIRAX) 400 MG tablet Take 0.5 tablets (200 mg total) by mouth 2 (two) times daily. 60 tablet 11  . amLODipine (NORVASC) 5 MG tablet Take 5 mg by mouth daily.    [name] aspirin EC 81 MG tablet Take 81 mg by mouth daily. Swallow whole.    [name] atorvastatin (LIPITOR) 80 MG tablet Take 80 mg by mouth daily.    . Blood Glucose Monitoring Suppl (ACCU-CHEK GUIDE ME) w/Device KIT 1 each by Does not apply route.    . carvedilol (COREG) 25 MG tablet Take 25 mg by mouth 2 (two) times daily with a meal.    . dexamethasone (DECADRON) 4 MG tablet Take 12 mg by mouth See admin instructions. Take after chemo treatments    . glucose blood (ACCU-CHEK GUIDE) test strip Use Accu Chek test strips as instructed to check blood sugar fasting or two hours after meals, every other day. 100 each 2  . hydrALAZINE (APRESOLINE) 50 MG tablet Take 50 mg by mouth 3 (three) times daily.    [name] ibuprofen (ADVIL,MOTRIN) 200 MG tablet Take 200 mg by mouth every 6 (six) hours as needed for moderate pain (for knee).    . isosorbide mononitrate (IMDUR) 30 MG 24 hr tablet Take 1 tablet (30 mg total) by mouth daily. 90 tablet 2  . losartan (COZAAR) 100 MG tablet Take 100 mg by mouth daily.    . metFORMIN (GLUCOPHAGE-XR)  500 MG 24 hr tablet Take 500 mg by mouth 2 (two) times daily.    . ondansetron (ZOFRAN) 8 MG tablet Take 8 mg by mouth every 8 (eight) hours as needed for nausea or vomiting.    . potassium chloride SA (KLOR-CON) 20 MEQ tablet Take 1 tablet by mouth once daily 30 tablet 3  . predniSONE (DELTASONE) 20 MG tablet Take 1 tablet (20 mg total) by mouth daily as needed (arthiritis). 30 tablet 3  . prochlorperazine (COMPAZINE) 10 MG tablet Take 1 tablet (10 mg total) by mouth every 6 (six) hours as needed (Nausea or vomiting). 30 tablet 1  . repaglinide (PRANDIN) 1 MG tablet Take 1 tablet (1 mg total) by mouth 3 (three) times daily before meals. 90 tablet 1   No current facility-administered medications for this visit.   Facility-Administered Medications Ordered in Other Visits  Medication Dose Route Frequency Provider Last Rate Last Admin  . daratumumab (DARZALEX) 1,300 mg in sodium chloride 0.9 % 435 mL (2.6 mg/mL) chemo infusion  16 mg/kg (Treatment Plan Recorded) Intravenous Once Brunetta Genera, MD      . famotidine (PEPCID) 40 mg in sodium chloride 0.9 % 100 mL IVPB  40 mg Intravenous Once Brunetta Genera, MD 416 mL/hr at 05/29/20 1123 40 mg at 05/29/20 1123  . sodium chloride flush (NS) 0.9 % injection 10 mL  10 mL Intravenous PRN Brunetta Genera, MD        REVIEW OF SYSTEMS:  A 10+ POINT REVIEW OF SYSTEMS WAS OBTAINED including neurology, dermatology, psychiatry, cardiac, respiratory, lymph, extremities, GI, GU, Musculoskeletal, constitutional, breasts, reproductive, HEENT.  All pertinent positives are noted in the HPI.  All others are negative.   PHYSICAL EXAMINATION: ECOG PERFORMANCE STATUS: 0 - Asymptomatic  VS reviewed - stable  GENERAL:alert, in no  acute distress and comfortable SKIN: no acute rashes, no significant lesions EYES: conjunctiva are pink and non-injected, sclera anicteric OROPHARYNX: MMM, no exudates, no oropharyngeal erythema or ulceration NECK: supple,  no JVD LYMPH:  no palpable lymphadenopathy in the cervical, axillary or inguinal regions LUNGS: clear to auscultation b/l with normal respiratory effort HEART: regular rate & rhythm ABDOMEN:  normoactive bowel sounds , non tender, not distended. No palpable hepatosplenomegaly.  Extremity: no pedal edema PSYCH: alert & oriented x 3 with fluent speech NEURO: no focal motor/sensory deficits  LABORATORY DATA:  I have reviewed the data as listed  . CBC Latest Ref Rng & Units 05/29/2020 04/30/2020 04/23/2020  WBC 4.0 - 10.5 K/uL 6.9 8.2 7.8  Hemoglobin 13.0 - 17.0 g/dL 8.8(L) 9.8(L) 9.4(L)  Hematocrit 39 - 52 % 27.6(L) 32.0(L) 29.1(L)  Platelets 150 - 400 K/uL 351 273 390    . CMP Latest Ref Rng & Units 05/29/2020 05/28/2020 05/03/2020  Glucose 70 - 99 mg/dL 104(H) 177(H) 352(H)  BUN 8 - 23 mg/dL $Remove'16 14 18  'aJbCXia$ Creatinine 0.61 - 1.24 mg/dL 1.54(H) 1.63(H) 1.29  Sodium 135 - 145 mmol/L 142 140 136  Potassium 3.5 - 5.1 mmol/L 3.4(L) 4.0 3.8  Chloride 98 - 111 mmol/L 110 106 104  CO2 22 - 32 mmol/L $RemoveB'22 27 24  'ldfMAfqy$ Calcium 8.9 - 10.3 mg/dL 9.0 9.3 8.8  Total Protein 6.5 - 8.1 g/dL 6.8 - -  Total Bilirubin 0.3 - 1.2 mg/dL 0.5 - -  Alkaline Phos 38 - 126 U/L 77 - -  AST 15 - 41 U/L 8(L) - -  ALT 0 - 44 U/L <6 - -   10/24/2019 FISH Panel:    10/24/2019 [facility] Bx Surgical Pathology:    RADIOGRAPHIC STUDIES: I have personally reviewed the radiological images as listed and agreed with the findings in the report. DG Chest 2 View  Result Date: 05/25/2020 CLINICAL DATA:  Pneumonia, follow-up examination EXAM: CHEST - 2 VIEW COMPARISON:  04/23/2020 FINDINGS: Lungs are clear. No pneumothorax or pleural effusion. Cardiac size is within normal limits. Coronary artery bypass grafting has been performed. Pulmonary vascularity is normal. Degenerative changes are seen within the thoracic spine. No acute bone abnormality. IMPRESSION: Resolved pulmonary infiltrates. No radiographic evidence of acute  cardiopulmonary disease. Electronically Signed   By: Fidela Salisbury MD   On: 05/25/2020 02:26    ASSESSMENT & PLAN:    64 yo with   1)  Newly diagnosed Multiple myeloma -09/27/19 M Protein at 3.4 -10/25/2019 PET/CT ([id number]) revealed "1. No specific myelomatous lesions are identified. 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  2) Anemia ? Related to CKD vs ? Plasma cell dyscrasia 3) CKD ?etiology - HTN ? Cardiac issues ? Myeloma related. 4) recently diagnosed small Pulmonary embolism PLAN: -Discussed pt labwork today, 05/29/20; all values are WNL except for RBC at 3.51, Hgb at 8.8, HCT at 27.6, MCV at 78.6, MCH at 25.1, RDW at 17.5, Potassium at 3.4, Glucose at 104, Creatinine at 1.54,  Albumin at 2.8, AST at 8, GFR Est at 50. -Discussed 05/29/2020 MMP is in progress, 04/30/2020 M Protein is stable at 0.5 g/dL  -The pt has no prohibitive toxicities from continuing C8 Daratumumab/Dexamethasone at his time. -Advised pt that we would like add Carflizomib and continue Daratumumab q2weeks to treat myeloma more aggressively -- pt agrees. -Advised pt that we would treat his PE with anticoagulation for at least 6 months. Recommend pt restart Eliquis & hold Aspirin.  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 Recommend pt f/u with Rheumatology ASAP. -Advised pt that an autologous transplant will require  high-dose chemotherapy, but seeks to get a deeper response. -Advised pt that if he chooses not to proceed with a transplant we would go to maintenance treatment.  -Will refer pt to [facility] Aris Lot at [facility] for transplant evaluation.  -Recommend IV Iron to correct iron deficiency and improve anemia. -Recommend pt receive the annual flu vaccine.  -Will give IV Venofer weekly x4 -Rx Eliquis -Will see back in 2 weeks      FOLLOW UP: Plz adjust schedule for current and future treatment based on treatment changes. Labs D1,8 and D15 each cycle Dara/Carfilzomib on D1 and D15 and Carfilzomib only on D8 of each 28 day cycle IV Venofer weekly x 4 doses (schedule with other treatment days) MD visit in 2 weeks Referral to Dr Aris Lot at [facility] for Myeloma transplant considerations    The total time spent in the appt was 30 minutes and more than 50% was on counseling and direct patient cares.  All of the patient's questions were answered with apparent satisfaction. The patient knows to call the clinic with any problems, questions or concerns.    Sullivan Lone MD [place] AAHIVMS [facility] Hematology/Oncology Physician [facility]  (Office):       [phone] (Work cell):  [phone] (Fax):           [phone]  05/29/2020 11:29 AM  I, Yevette Edwards, am acting as a scribe for Dr. Sullivan Lone.   .I have reviewed the above documentation for accuracy and completeness, and I agree with the above. Brunetta Genera MD

## 2020-05-29 NOTE — Patient Instructions (Signed)
[facility] Discharge Instructions for Patients Receiving Chemotherapy  Today you received the following chemotherapy agent: Daratumumab (Darzalex)  To help prevent nausea and vomiting after your treatment, we encourage you to take your nausea medication as directed by your MD.   If you develop nausea and vomiting that is not controlled by your nausea medication, call the clinic.   BELOW ARE SYMPTOMS THAT SHOULD BE REPORTED IMMEDIATELY:  *FEVER GREATER THAN 100.5 F  *CHILLS WITH OR WITHOUT FEVER  NAUSEA AND VOMITING THAT IS NOT CONTROLLED WITH YOUR NAUSEA MEDICATION  *UNUSUAL SHORTNESS OF BREATH  *UNUSUAL BRUISING OR BLEEDING  TENDERNESS IN MOUTH AND THROAT WITH OR WITHOUT PRESENCE OF ULCERS  *URINARY PROBLEMS  *BOWEL PROBLEMS  UNUSUAL RASH Items with * indicate a potential emergency and should be followed up as soon as possible.  Feel free to call the clinic should you have any questions or concerns. The clinic phone number is [phone].  Please show the CHEMO ALERT CARD at check-in to the Emergency Department and triage nurse.   

## 2020-05-30 ENCOUNTER — Other Ambulatory Visit: Payer: No Typology Code available for payment source

## 2020-05-30 LAB — MULTIPLE MYELOMA PANEL, SERUM
Albumin SerPl Elph-Mcnc: 2.7 g/dL — ABNORMAL LOW (ref 2.9–4.4)
Albumin/Glob SerPl: 0.9 (ref 0.7–1.7)
Alpha 1: 0.3 g/dL (ref 0.0–0.4)
Alpha2 Glob SerPl Elph-Mcnc: 1.1 g/dL — ABNORMAL HIGH (ref 0.4–1.0)
B-Globulin SerPl Elph-Mcnc: 0.8 g/dL (ref 0.7–1.3)
Gamma Glob SerPl Elph-Mcnc: 1.1 g/dL (ref 0.4–1.8)
Globulin, Total: 3.2 g/dL (ref 2.2–3.9)
IgA: 73 mg/dL (ref 61–437)
IgG (Immunoglobin G), Serum: 1116 mg/dL (ref 603–1613)
IgM (Immunoglobulin M), Srm: 27 mg/dL (ref 20–172)
M Protein SerPl Elph-Mcnc: 0.7 g/dL — ABNORMAL HIGH
Total Protein ELP: 5.9 g/dL — ABNORMAL LOW (ref 6.0–8.5)

## 2020-06-04 ENCOUNTER — Ambulatory Visit (INDEPENDENT_AMBULATORY_CARE_PROVIDER_SITE_OTHER): Payer: No Typology Code available for payment source | Admitting: Endocrinology

## 2020-06-04 ENCOUNTER — Other Ambulatory Visit: Payer: Self-pay

## 2020-06-04 VITALS — BP 130/70 | HR 94 | Ht 70.0 in | Wt 172.6 lb

## 2020-06-04 DIAGNOSIS — Z23 Encounter for immunization: Secondary | ICD-10-CM

## 2020-06-04 DIAGNOSIS — D509 Iron deficiency anemia, unspecified: Secondary | ICD-10-CM | POA: Insufficient documentation

## 2020-06-04 DIAGNOSIS — E1165 Type 2 diabetes mellitus with hyperglycemia: Secondary | ICD-10-CM | POA: Diagnosis not present

## 2020-06-04 NOTE — Patient Instructions (Signed)
Take Metformin in am with food  Repeglanide as before just before each meal upto 30 min before, double dose on day of infusion and next day  Check sugars daily  Check blood sugars on waking up 2-3 days a week  Also check blood sugars about 2 hours after meals and do this after different meals by rotation  Recommended blood sugar levels on waking up are 90-130 and about 2 hours after meal is 130-180  Please bring your blood sugar monitor to each visit, thank you

## 2020-06-04 NOTE — Progress Notes (Signed)
Patient ID: [name], male   DOB: April 17, 1956, 64 y.o.   MRN: [id number]           Reason for Appointment: Follow-up for Type 2 Diabetes   History of Present Illness:          Date of diagnosis of type 2 diabetes mellitus:   2018      Background history:  He has never been told to have diabetes but review of his previous labs indicate that he had high blood sugar 179 in 2014 His highest sugar has been 234 in 10/2016 when his A1c was 8.8, not clear if he was symptomatic at that time He was recommended referral for diabetes at that time but he did not make an appointment  Recent history:   A1c is 9.7 compared to 8.1   Non-insulin hypoglycemic drugs the patient is taking are: Metformin irregular, Prandin 1-2 mg 3 times daily  Current management, blood sugar patterns and problems identified:   He was advised to take Prandin instead of glipizide because of his tendency to occasional hypoglycemia and also high postprandial readings previously  However he is again confused about his medications and has not taken his Metformin since about a week ago  Previously was told to take Metformin 3 times a day  Also not clear if he is taking his Prandin just before eating and may have taken it a couple of hours before eating today  Today had regular Kool-Aid to drink with his breakfast and blood sugar was over 200  He is getting his chemotherapy infusions about once a month possibly with steroids and this makes his blood sugar normal, most recently it was 177 in the lab after the infusion  His only high blood sugars recently have been midday but has not checked his blood sugars regularly and only has a few days of blood sugars about 3 weeks ago  Renal function fluctuates            Glucose monitoring:  Very regularly, has Accu-Chek guide meter  Blood sugars from download:   PRE-MEAL Fasting Lunch Dinner Bedtime Overall  Glucose range:  115-162  223, 226  125  114    Mean/median:  135     150   POST-MEAL PC Breakfast PC Lunch PC Dinner  Glucose range:  ? ?  Mean/median:         Dietician visit, most recent: 2/21  Weight history:  Wt Readings from Last 3 Encounters:  06/04/20 172 lb 9.6 oz (78.3 kg)  05/29/20 164 lb 9.6 oz (74.7 kg)  05/07/20 164 lb (74.4 kg)    Glycemic control:   Lab Results  Component Value Date   HGBA1C 9.7 (H) 05/03/2020   HGBA1C 8.1 (H) 04/05/2020   HGBA1C 8.1 (H) 03/14/2020   Lab Results  Component Value Date   MICROALBUR 12.6 (H) 07/21/2018   LDLCALC 39 09/04/2019   CREATININE 1.54 (H) 05/29/2020   Lab Results  Component Value Date   MICRALBCREAT 10.0 07/21/2018    Lab Results  Component Value Date   FRUCTOSAMINE 327 (H) 05/28/2020   FRUCTOSAMINE 345 (H) 04/05/2020   FRUCTOSAMINE 322 (H) 02/02/2020    Appointment on 05/29/2020  Component Date Value Ref Range Status  . WBC 05/29/2020 6.9  4.0 - 10.5 K/uL Final  . RBC 05/29/2020 3.51* 4.22 - 5.81 MIL/uL Final  . Hemoglobin 05/29/2020 8.8* 13.0 - 17.0 g/dL Final   Comment: Reticulocyte Hemoglobin testing may be clinically indicated, consider  ordering this additional test [id number]   . HCT 05/29/2020 27.6* 39 - 52 % Final  . MCV 05/29/2020 78.6* 80.0 - 100.0 fL Final  . MCH 05/29/2020 25.1* 26.0 - 34.0 pg Final  . MCHC 05/29/2020 31.9  30.0 - 36.0 g/dL Final  . RDW 05/29/2020 17.5* 11.5 - 15.5 % Final  . Platelets 05/29/2020 351  150 - 400 K/uL Final  . nRBC 05/29/2020 0.0  0.0 - 0.2 % Final  . Neutrophils Relative % 05/29/2020 67  % Final  . Neutro Abs 05/29/2020 4.6  1.7 - 7.7 K/uL Final  . Lymphocytes Relative 05/29/2020 21  % Final  . Lymphs Abs 05/29/2020 1.5  0.7 - 4.0 K/uL Final  . Monocytes Relative 05/29/2020 10  % Final  . Monocytes Absolute 05/29/2020 0.7  0.1 - 1.0 K/uL Final  . Eosinophils Relative 05/29/2020 2  % Final  . Eosinophils Absolute 05/29/2020 0.1  0.0 - 0.5 K/uL Final  . Basophils Relative 05/29/2020 0  % Final  .  Basophils Absolute 05/29/2020 0.0  0.0 - 0.1 K/uL Final  . Immature Granulocytes 05/29/2020 0  % Final  . Abs Immature Granulocytes 05/29/2020 0.03  0.00 - 0.07 K/uL Final   Performed at [facility], [place] [address] [place], [place] [address]  . Sodium 05/29/2020 142  135 - 145 mmol/L Final  . Potassium 05/29/2020 3.4* 3.5 - 5.1 mmol/L Final  . Chloride 05/29/2020 110  98 - 111 mmol/L Final  . CO2 05/29/2020 22  22 - 32 mmol/L Final  . Glucose, Bld 05/29/2020 104* 70 - 99 mg/dL Final   Glucose reference range applies only to samples taken after fasting for at least 8 hours.  . BUN 05/29/2020 16  8 - 23 mg/dL Final  . Creatinine 05/29/2020 1.54* 0.61 - 1.24 mg/dL Final  . Calcium 05/29/2020 9.0  8.9 - 10.3 mg/dL Final  . Total Protein 05/29/2020 6.8  6.5 - 8.1 g/dL Final  . Albumin 05/29/2020 2.8* 3.5 - 5.0 g/dL Final  . AST 05/29/2020 8* 15 - 41 U/L Final  . ALT 05/29/2020 <6  0 - 44 U/L Final  . Alkaline Phosphatase 05/29/2020 77  38 - 126 U/L Final  . Total Bilirubin 05/29/2020 0.5  0.3 - 1.2 mg/dL Final  . GFR, Estimated 05/29/2020 50* >60 mL/min Final   Comment: (NOTE) Calculated using the CKD-EPI Creatinine Equation (2021)   . Anion gap 05/29/2020 10  5 - 15 Final   Performed at [facility], [place] [address] [place], [place] [address]  . IgG (Immunoglobin G), Serum 05/29/2020 1,116  603 - 1,613 mg/dL Final  . IgA 05/29/2020 73  61 - 437 mg/dL Final  . IgM (Immunoglobulin M), Srm 05/29/2020 27  20 - 172 mg/dL Final  . Total Protein ELP 05/29/2020 5.9* 6.0 - 8.5 g/dL Corrected  . Albumin SerPl Elph-Mcnc 05/29/2020 2.7* 2.9 - 4.4 g/dL Corrected  . Alpha 1 05/29/2020 0.3  0.0 - 0.4 g/dL Corrected  . Alpha2 Glob SerPl Elph-Mcnc 05/29/2020 1.1* 0.4 - 1.0 g/dL Corrected  . B-Globulin SerPl Elph-Mcnc 05/29/2020 0.8  0.7 - 1.3 g/dL Corrected  . Gamma Glob SerPl Elph-Mcnc 05/29/2020 1.1  0.4 - 1.8 g/dL Corrected  . M Protein SerPl  Elph-Mcnc 05/29/2020 0.7* Not Observed g/dL Corrected  . Globulin, Total 05/29/2020 3.2  2.2 - 3.9 g/dL Corrected  . Albumin/Glob SerPl 05/29/2020 0.9  0.7 - 1.7 Corrected  . 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 Please note that samples from patients receiving DARZALEX(R) (daratumumab) treatment can appear as an "IgG kappa" and mask a complete response. If this patient is receiving DARA, this IFE assay interference can be removed by ordering test number [id number]-"Immunofixation, Daratumumab-Specific, Serum" and submitting a new sample for testing or by calling the lab to add this test to the current sample.   . Please Note 05/29/2020 Comment   Corrected   Comment: (NOTE) Protein electrophoresis scan will follow via computer, mail, or courier delivery. Performed At: [facility] [place], [place] [id number] Rush Farmer MD ZM:[id number]     Allergies as of 06/04/2020   No Known Allergies     Medication List       Accurate as of June 04, 2020  3:45 PM. If you have any questions, ask your nurse or doctor.        Accu-Chek Guide Me w/Device Kit 1 each by Does not apply route.   Accu-Chek Guide test strip Generic drug: glucose blood Use Accu Chek test strips as instructed to check blood sugar fasting or two hours after meals, every other day.   Accu-Chek Softclix Lancets lancets Use Accu Chek softclix lancets to check blood sugar fasting or 2 hours after a meal every other day.   acyclovir 400 MG tablet Commonly known as: ZOVIRAX Take 0.5 tablets (200 mg total) by mouth 2 (two) times daily.   amLODipine 5 MG tablet Commonly known as: NORVASC Take 5 mg by mouth daily.   aspirin EC 81 MG tablet Take 81 mg by mouth daily. Swallow whole.   atorvastatin 80 MG tablet Commonly known as: LIPITOR Take 80 mg by mouth daily.   carvedilol 25 MG  tablet Commonly known as: COREG Take 25 mg by mouth 2 (two) times daily with a meal.   dexamethasone 4 MG tablet Commonly known as: DECADRON Take 12 mg by mouth See admin instructions. Take after chemo treatments   hydrALAZINE 50 MG tablet Commonly known as: APRESOLINE Take 50 mg by mouth 3 (three) times daily.   ibuprofen 200 MG tablet Commonly known as: ADVIL Take 200 mg by mouth every 6 (six) hours as needed for moderate pain (for knee).   isosorbide mononitrate 30 MG 24 hr tablet Commonly known as: IMDUR Take 1 tablet (30 mg total) by mouth daily.   losartan 100 MG tablet Commonly known as: COZAAR Take 100 mg by mouth daily.   metFORMIN 500 MG 24 hr tablet Commonly known as: GLUCOPHAGE-XR Take 500 mg by mouth 2 (two) times daily.   ondansetron 8 MG tablet Commonly known as: ZOFRAN Take 8 mg by mouth every 8 (eight) hours as needed for nausea or vomiting.   potassium chloride SA 20 MEQ tablet Commonly known as: KLOR-CON Take 1 tablet by mouth once daily   predniSONE 20 MG tablet Commonly known as: DELTASONE Take 1 tablet (20 mg total) by mouth daily as needed (arthiritis).   prochlorperazine 10 MG tablet Commonly known as: COMPAZINE Take 1 tablet (10 mg total) by mouth every 6 (six) hours as needed (Nausea or vomiting).   repaglinide 1 MG tablet Commonly known as: Prandin Take 1 tablet (1 mg total) by mouth 3 (three) times daily before meals.       Allergies: No Known Allergies  Past Medical History:  Diagnosis Date  . Anemia   . Arthritis    "left knee" (11/24/2017)  .  CAD (coronary artery disease)    CABG 2002  . Cancer [facility])    Multiple Myeloma  . CHF (congestive heart failure) ([place])   . Chronic kidney disease   . Chronic renal insufficiency   . Diabetes mellitus without complication ([place])   . Dilated cardiomyopathy ([place])    echo in 2009 showed improvement with a normal EF  . HTN (hypertension)   . Hyperlipemia   . Hypertension   .  Noncompliance   . PAD (peripheral artery disease) ([place])   . Pneumonia     Past Surgical History:  Procedure Laterality Date  . CARDIAC CATHETERIZATION  2002, 2006  . CORONARY ARTERY BYPASS GRAFT  2002   x4 DR. VAN TRIGT  . IR FLUORO GUIDED NEEDLE PLC ASPIRATION/INJECTION LOC  10/24/2019    Family History  Problem Relation Age of Onset  . Hypertension Father   . Diabetes Mother        DIABETIC COMA    Social History:  reports that he has never smoked. He has never used smokeless tobacco. He reports previous alcohol use. He reports previous drug use.   Review of Systems  Endocrine: Negative for abnormal weight gain.     Lipid history: On atorvastatin 80 mg since diagnosis of CAD, labs as follows    Lab Results  Component Value Date   CHOL 92 (L) 09/04/2019   HDL 35 (L) 09/04/2019   LDLCALC 39 09/04/2019   LDLDIRECT 149.4 01/07/2011   TRIG 89 09/04/2019   CHOLHDL 2.6 09/04/2019           Hypertension: Has been followed by cardiologist for blood pressure management Recent readings:  BP Readings from Last 3 Encounters:  06/04/20 130/70  05/29/20 129/62  05/29/20 133/77   RENAL dysfunction is improving  Renal dysfunction not related to diabetes  No history of proteinuria  Lab Results  Component Value Date   CREATININE 1.54 (H) 05/29/2020   CREATININE 1.63 (H) 05/28/2020   CREATININE 1.29 05/03/2020    Most recent eye exam was in 2018  Most recent foot exam: 07/2018  Currently known complications of diabetes:none  LABS:  Appointment on 05/29/2020  Component Date Value Ref Range Status  . WBC 05/29/2020 6.9  4.0 - 10.5 K/uL Final  . RBC 05/29/2020 3.51* 4.22 - 5.81 MIL/uL Final  . Hemoglobin 05/29/2020 8.8* 13.0 - 17.0 g/dL Final   Comment: Reticulocyte Hemoglobin testing may be clinically indicated, consider ordering this additional test [id number]   . HCT 05/29/2020 27.6* 39 - 52 % Final  . MCV 05/29/2020 78.6* 80.0 - 100.0 fL Final  . MCH  05/29/2020 25.1* 26.0 - 34.0 pg Final  . MCHC 05/29/2020 31.9  30.0 - 36.0 g/dL Final  . RDW 05/29/2020 17.5* 11.5 - 15.5 % Final  . Platelets 05/29/2020 351  150 - 400 K/uL Final  . nRBC 05/29/2020 0.0  0.0 - 0.2 % Final  . Neutrophils Relative % 05/29/2020 67  % Final  . Neutro Abs 05/29/2020 4.6  1.7 - 7.7 K/uL Final  . Lymphocytes Relative 05/29/2020 21  % Final  . Lymphs Abs 05/29/2020 1.5  0.7 - 4.0 K/uL Final  . Monocytes Relative 05/29/2020 10  % Final  . Monocytes Absolute 05/29/2020 0.7  0.1 - 1.0 K/uL Final  . Eosinophils Relative 05/29/2020 2  % Final  . Eosinophils Absolute 05/29/2020 0.1  0.0 - 0.5 K/uL Final  . Basophils Relative 05/29/2020 0  % Final  . Basophils Absolute 05/29/2020 0.0  0.0 - 0.1 K/uL Final  . Immature Granulocytes 05/29/2020 0  % Final  . Abs Immature Granulocytes 05/29/2020 0.03  0.00 - 0.07 K/uL Final   Performed at [facility], [place] [address] [place], [place] [address]  . Sodium 05/29/2020 142  135 - 145 mmol/L Final  . Potassium 05/29/2020 3.4* 3.5 - 5.1 mmol/L Final  . Chloride 05/29/2020 110  98 - 111 mmol/L Final  . CO2 05/29/2020 22  22 - 32 mmol/L Final  . Glucose, Bld 05/29/2020 104* 70 - 99 mg/dL Final   Glucose reference range applies only to samples taken after fasting for at least 8 hours.  . BUN 05/29/2020 16  8 - 23 mg/dL Final  . Creatinine 05/29/2020 1.54* 0.61 - 1.24 mg/dL Final  . Calcium 05/29/2020 9.0  8.9 - 10.3 mg/dL Final  . Total Protein 05/29/2020 6.8  6.5 - 8.1 g/dL Final  . Albumin 05/29/2020 2.8* 3.5 - 5.0 g/dL Final  . AST 05/29/2020 8* 15 - 41 U/L Final  . ALT 05/29/2020 <6  0 - 44 U/L Final  . Alkaline Phosphatase 05/29/2020 77  38 - 126 U/L Final  . Total Bilirubin 05/29/2020 0.5  0.3 - 1.2 mg/dL Final  . GFR, Estimated 05/29/2020 50* >60 mL/min Final   Comment: (NOTE) Calculated using the CKD-EPI Creatinine Equation (2021)   . Anion gap 05/29/2020 10  5 - 15 Final   Performed at [facility], [place] [address], [place], [place] [address]  . IgG (Immunoglobin G), Serum 05/29/2020 1,116  603 - 1,613 mg/dL Final  . IgA 05/29/2020 73  61 - 437 mg/dL Final  . IgM (Immunoglobulin M), Srm 05/29/2020 27  20 - 172 mg/dL Final  . Total Protein ELP 05/29/2020 5.9* 6.0 - 8.5 g/dL Corrected  . Albumin SerPl Elph-Mcnc 05/29/2020 2.7* 2.9 - 4.4 g/dL Corrected  . Alpha 1 05/29/2020 0.3  0.0 - 0.4 g/dL Corrected  . Alpha2 Glob SerPl Elph-Mcnc 05/29/2020 1.1* 0.4 - 1.0 g/dL Corrected  . B-Globulin SerPl Elph-Mcnc 05/29/2020 0.8  0.7 - 1.3 g/dL Corrected  . Gamma Glob SerPl Elph-Mcnc 05/29/2020 1.1  0.4 - 1.8 g/dL Corrected  . M Protein SerPl Elph-Mcnc 05/29/2020 0.7* Not Observed g/dL Corrected  . Globulin, Total 05/29/2020 3.2  2.2 - 3.9 g/dL Corrected  . Albumin/Glob SerPl 05/29/2020 0.9  0.7 - 1.7 Corrected  . 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 Please note that samples from patients receiving DARZALEX(R) (daratumumab) treatment can appear as an "IgG kappa" and mask a complete response. If this patient is receiving DARA, this IFE assay interference can be removed by ordering test number [id number]-"Immunofixation, Daratumumab-Specific, Serum" and submitting a new sample for testing or by calling the lab to add this test to the current sample.   . Please Note 05/29/2020 Comment   Corrected   Comment: (NOTE) Protein electrophoresis scan will follow via computer, mail, or courier delivery. Performed At: [facility] [place], [place] [id number] Rush Farmer MD [id number]     Physical Examination:  BP 130/70   Pulse 94   Ht $R'5\' 10"'Kp$  (1.778 m)   Wt 172 lb 9.6 oz (78.3 kg)   SpO2 98%   BMI 24.77 kg/m          ASSESSMENT:  Diabetes type 2, mild  See history of present illness for detailed discussion of current diabetes  management, blood sugar patterns and problems identified  A1c was last 9.7  Fructosamine is not 327 indicating relatively better sugar  He is on a regimen of Metformin and Prandin although he has been taking only Prandin in the last week apparently He is still having difficulty understanding his medication regimen Also no blood sugar testing being done in the last 2 weeks Not clear if his blood sugars were significantly high after his chemotherapy and steroid infusion last week because of lack of monitoring     PLAN:    Restart Metformin, for now only take it once a day in am with food  Repeglanide 1 mg as before to be taken before each meal upto 30 min before eating, double dose on day of infusion and next day  Check sugars daily on a regular basis as follows:  Check blood sugars on waking up 2-3 days a week  Also check blood sugars about 2 hours after meals and do this after different meals by rotation  Recommended blood sugar levels on waking up are 90-130 and about 2 hours after meal is 130-180  He will call if he has consistently high or low blood sugars Avoid all drinks with sugar  Flu vaccine given   Patient Instructions  Take Metformin in am with food  Repeglanide as before just before each meal upto 30 min before, double dose on day of infusion and next day  Check sugars daily  Check blood sugars on waking up 2-3 days a week  Also check blood sugars about 2 hours after meals and do this after different meals by rotation  Recommended blood sugar levels on waking up are 90-130 and about 2 hours after meal is 130-180  Please bring your blood sugar monitor to each visit, thank you        Elayne Snare 06/04/2020, 3:45 PM   Note: This office note was prepared with Dragon voice recognition system technology. Any transcriptional errors that result from this process are unintentional.   Elayne Snare

## 2020-06-07 ENCOUNTER — Other Ambulatory Visit: Payer: No Typology Code available for payment source

## 2020-06-07 ENCOUNTER — Inpatient Hospital Stay: Payer: PRIVATE HEALTH INSURANCE

## 2020-06-07 ENCOUNTER — Other Ambulatory Visit: Payer: Self-pay

## 2020-06-07 VITALS — BP 149/82 | HR 72 | Temp 98.1°F | Resp 18 | Wt 171.5 lb

## 2020-06-07 DIAGNOSIS — Z7189 Other specified counseling: Secondary | ICD-10-CM

## 2020-06-07 DIAGNOSIS — D649 Anemia, unspecified: Secondary | ICD-10-CM

## 2020-06-07 DIAGNOSIS — C9000 Multiple myeloma not having achieved remission: Secondary | ICD-10-CM

## 2020-06-07 DIAGNOSIS — Z5112 Encounter for antineoplastic immunotherapy: Secondary | ICD-10-CM | POA: Diagnosis not present

## 2020-06-07 DIAGNOSIS — D508 Other iron deficiency anemias: Secondary | ICD-10-CM

## 2020-06-07 LAB — CMP (CANCER CENTER ONLY)
ALT: 6 U/L (ref 0–44)
AST: 6 U/L — ABNORMAL LOW (ref 15–41)
Albumin: 2.8 g/dL — ABNORMAL LOW (ref 3.5–5.0)
Alkaline Phosphatase: 96 U/L (ref 38–126)
Anion gap: 8 (ref 5–15)
BUN: 20 mg/dL (ref 8–23)
CO2: 23 mmol/L (ref 22–32)
Calcium: 9 mg/dL (ref 8.9–10.3)
Chloride: 113 mmol/L — ABNORMAL HIGH (ref 98–111)
Creatinine: 1.73 mg/dL — ABNORMAL HIGH (ref 0.61–1.24)
GFR, Estimated: 44 mL/min — ABNORMAL LOW (ref >60)
Glucose, Bld: 113 mg/dL — ABNORMAL HIGH (ref 70–99)
Potassium: 3.8 mmol/L (ref 3.5–5.1)
Sodium: 144 mmol/L (ref 135–145)
Total Bilirubin: 0.5 mg/dL (ref 0.3–1.2)
Total Protein: 6.8 g/dL (ref 6.5–8.1)

## 2020-06-07 LAB — CBC WITH DIFFERENTIAL/PLATELET
Abs Immature Granulocytes: 0.04 10*3/uL (ref 0.00–0.07)
Basophils Absolute: 0 10*3/uL (ref 0.0–0.1)
Basophils Relative: 0 %
Eosinophils Absolute: 0.1 10*3/uL (ref 0.0–0.5)
Eosinophils Relative: 2 %
HCT: 27.7 % — ABNORMAL LOW (ref 39.0–52.0)
Hemoglobin: 8.6 g/dL — ABNORMAL LOW (ref 13.0–17.0)
Immature Granulocytes: 1 %
Lymphocytes Relative: 28 %
Lymphs Abs: 1.7 10*3/uL (ref 0.7–4.0)
MCH: 25.3 pg — ABNORMAL LOW (ref 26.0–34.0)
MCHC: 31 g/dL (ref 30.0–36.0)
MCV: 81.5 fL (ref 80.0–100.0)
Monocytes Absolute: 0.7 10*3/uL (ref 0.1–1.0)
Monocytes Relative: 11 %
Neutro Abs: 3.5 10*3/uL (ref 1.7–7.7)
Neutrophils Relative %: 58 %
Platelets: 293 10*3/uL (ref 150–400)
RBC: 3.4 MIL/uL — ABNORMAL LOW (ref 4.22–5.81)
RDW: 18.6 % — ABNORMAL HIGH (ref 11.5–15.5)
WBC: 6.1 10*3/uL (ref 4.0–10.5)
nRBC: 0 % (ref 0.0–0.2)

## 2020-06-07 MED ORDER — SODIUM CHLORIDE 0.9 % IV SOLN
Freq: Once | INTRAVENOUS | Status: AC
  Filled 2020-06-07: qty 250

## 2020-06-07 MED ORDER — DEXTROSE 5 % IV SOLN
20.0000 mg/m2 | Freq: Once | INTRAVENOUS | Status: AC
  Administered 2020-06-07: 40 mg via INTRAVENOUS
  Filled 2020-06-07: qty 5

## 2020-06-07 MED ORDER — SODIUM CHLORIDE 0.9 % IV SOLN
200.0000 mg | Freq: Once | INTRAVENOUS | Status: AC
  Administered 2020-06-07: 200 mg via INTRAVENOUS
  Filled 2020-06-07: qty 200

## 2020-06-07 MED ORDER — DIPHENHYDRAMINE HCL 25 MG PO CAPS
ORAL_CAPSULE | ORAL | Status: AC
  Filled 2020-06-07: qty 1

## 2020-06-07 MED ORDER — METHYLPREDNISOLONE SODIUM SUCC 125 MG IJ SOLR
60.0000 mg | Freq: Once | INTRAMUSCULAR | Status: AC
  Administered 2020-06-07: 60 mg via INTRAVENOUS

## 2020-06-07 MED ORDER — METHYLPREDNISOLONE SODIUM SUCC 125 MG IJ SOLR
INTRAMUSCULAR | Status: AC
  Filled 2020-06-07: qty 2

## 2020-06-07 MED ORDER — ACETAMINOPHEN 325 MG PO TABS
650.0000 mg | ORAL_TABLET | Freq: Once | ORAL | Status: AC
  Administered 2020-06-07: 650 mg via ORAL

## 2020-06-07 MED ORDER — DIPHENHYDRAMINE HCL 25 MG PO CAPS
25.0000 mg | ORAL_CAPSULE | Freq: Once | ORAL | Status: AC
  Administered 2020-06-07: 25 mg via ORAL

## 2020-06-07 MED ORDER — ACETAMINOPHEN 325 MG PO TABS
ORAL_TABLET | ORAL | Status: AC
  Filled 2020-06-07: qty 2

## 2020-06-07 NOTE — Patient Instructions (Signed)
[facility] Discharge Instructions for Patients Receiving Chemotherapy  Today you received the following chemotherapy agents: carfilzomib.  To help prevent nausea and vomiting after your treatment, we encourage you to take your nausea medication as directed.   If you develop nausea and vomiting that is not controlled by your nausea medication, call the clinic.   BELOW ARE SYMPTOMS THAT SHOULD BE REPORTED IMMEDIATELY:  *FEVER GREATER THAN 100.5 F  *CHILLS WITH OR WITHOUT FEVER  NAUSEA AND VOMITING THAT IS NOT CONTROLLED WITH YOUR NAUSEA MEDICATION  *UNUSUAL SHORTNESS OF BREATH  *UNUSUAL BRUISING OR BLEEDING  TENDERNESS IN MOUTH AND THROAT WITH OR WITHOUT PRESENCE OF ULCERS  *URINARY PROBLEMS  *BOWEL PROBLEMS  UNUSUAL RASH Items with * indicate a potential emergency and should be followed up as soon as possible.  Feel free to call the clinic should you have any questions or concerns. The clinic phone number is [phone].  Please show the [facility] at check-in to the Emergency Department and triage nurse.  Carfilzomib injection What is this medicine? CARFILZOMIB (kar FILZ oh mib) targets a specific protein within cancer cells and stops the cancer cells from growing. It is used to treat multiple myeloma. This medicine may be used for other purposes; ask your health care provider or pharmacist if you have questions. COMMON BRAND NAME(S): KYPROLIS What should I tell my health care provider before I take this medicine? They need to know if you have any of these conditions:  heart disease  history of blood clots  irregular heartbeat  kidney disease  liver disease  lung or breathing disease  an unusual or allergic reaction to carfilzomib, or other medicines, foods, dyes, or preservatives  pregnant or trying to get pregnant  breast-feeding How should I use this medicine? This medicine is for injection or infusion into a vein. It is given by  a health care professional in a hospital or clinic setting. Talk to your pediatrician regarding the use of this medicine in children. Special care may be needed. Overdosage: If you think you have taken too much of this medicine contact a poison control center or emergency room at once. NOTE: This medicine is only for you. Do not share this medicine with others. What if I miss a dose? It is important not to miss your dose. Call your doctor or health care professional if you are unable to keep an appointment. What may interact with this medicine? Interactions are not expected. Give your health care provider a list of all the medicines, herbs, non-prescription drugs, or dietary supplements you use. Also tell them if you smoke, drink alcohol, or use illegal drugs. Some items may interact with your medicine. This list may not describe all possible interactions. Give your health care provider a list of all the medicines, herbs, non-prescription drugs, or dietary supplements you use. Also tell them if you smoke, drink alcohol, or use illegal drugs. Some items may interact with your medicine. What should I watch for while using this medicine? Your condition will be monitored carefully while you are receiving this medicine. Report any side effects. Continue your course of treatment even though you feel ill unless your doctor tells you to stop. You may need blood work done while you are taking this medicine. Do not become pregnant while taking this medicine or for at least 6 months after stopping it. Women should inform their doctor if they wish to become pregnant or think they might be pregnant. There is a  potential for serious side effects to an unborn child. Men should not father a child while taking this medicine and for at least 3 months after stopping it. Talk to your health care professional or pharmacist for more information. Do not breast-feed an infant while taking this medicine or for 2 weeks after the  last dose. Check with your doctor or health care professional if you get an attack of severe diarrhea, nausea and vomiting, or if you sweat a lot. The loss of too much body fluid can make it dangerous for you to take this medicine. You may get dizzy. Do not drive, use machinery, or do anything that needs mental alertness until you know how this medicine affects you. Do not stand or sit up quickly, especially if you are an older patient. This reduces the risk of dizzy or fainting spells. What side effects may I notice from receiving this medicine? Side effects that you should report to your doctor or health care professional as soon as possible:  allergic reactions like skin rash, itching or hives, swelling of the face, lips, or tongue  confusion  dizziness  feeling faint or lightheaded  fever or chills  palpitations  seizures  signs and symptoms of bleeding such as bloody or black, tarry stools; red or dark-brown urine; spitting up blood or brown material that looks like coffee grounds; red spots on the skin; unusual bruising or bleeding including from the eye, gums, or nose  signs and symptoms of a blood clot such as breathing problems; changes in vision; chest pain; severe, sudden headache; pain, swelling, warmth in the leg; trouble speaking; sudden numbness or weakness of the face, arm or leg  signs and symptoms of kidney injury like trouble passing urine or change in the amount of urine  signs and symptoms of liver injury like dark yellow or brown urine; general ill feeling or flu-like symptoms; light-colored stools; loss of appetite; nausea; right upper belly pain; unusually weak or tired; yellowing of the eyes or skin Side effects that usually do not require medical attention (report to your doctor or health care professional if they continue or are bothersome):  back pain  cough  diarrhea  headache  muscle cramps  trouble sleeping  vomiting This list may not describe  all possible side effects. Call your doctor for medical advice about side effects. You may report side effects to FDA at 1-800-FDA-1088. Where should I keep my medicine? This drug is given in a hospital or clinic and will not be stored at home. NOTE: This sheet is a summary. It may not cover all possible information. If you have questions about this medicine, talk to your doctor, pharmacist, or health care provider.  2020 Elsevier/Gold Standard (2019-03-06 19:44:21)  Iron Sucrose injection What is this medicine? IRON SUCROSE (AHY ern SOO krohs) is an iron complex. Iron is used to make healthy red blood cells, which carry oxygen and nutrients throughout the body. This medicine is used to treat iron deficiency anemia in people with chronic kidney disease. This medicine may be used for other purposes; ask your health care provider or pharmacist if you have questions. COMMON BRAND NAME(S): Venofer What should I tell my health care provider before I take this medicine? They need to know if you have any of these conditions: anemia not caused by low iron levels heart disease high levels of iron in the blood kidney disease liver disease an unusual or allergic reaction to iron, other medicines, foods, dyes, or preservatives  pregnant or trying to get pregnant breast-feeding How should I use this medicine? This medicine is for infusion into a vein. It is given by a health care professional in a hospital or clinic setting. Talk to your pediatrician regarding the use of this medicine in children. While this drug may be prescribed for children as young as 2 years for selected conditions, precautions do apply. Overdosage: If you think you have taken too much of this medicine contact a poison control center or emergency room at once. NOTE: This medicine is only for you. Do not share this medicine with others. What if I miss a dose? It is important not to miss your dose. Call your doctor or health care  professional if you are unable to keep an appointment. What may interact with this medicine? Do not take this medicine with any of the following medications: deferoxamine dimercaprol other iron products This medicine may also interact with the following medications: chloramphenicol deferasirox This list may not describe all possible interactions. Give your health care provider a list of all the medicines, herbs, non-prescription drugs, or dietary supplements you use. Also tell them if you smoke, drink alcohol, or use illegal drugs. Some items may interact with your medicine. What should I watch for while using this medicine? Visit your doctor or healthcare professional regularly. Tell your doctor or healthcare professional if your symptoms do not start to get better or if they get worse. You may need blood work done while you are taking this medicine. You may need to follow a special diet. Talk to your doctor. Foods that contain iron include: whole grains/cereals, dried fruits, beans, or peas, leafy green vegetables, and organ meats (liver, kidney). What side effects may I notice from receiving this medicine? Side effects that you should report to your doctor or health care professional as soon as possible: allergic reactions like skin rash, itching or hives, swelling of the face, lips, or tongue breathing problems changes in blood pressure cough fast, irregular heartbeat feeling faint or lightheaded, falls fever or chills flushing, sweating, or hot feelings joint or muscle aches/pains seizures swelling of the ankles or feet unusually weak or tired Side effects that usually do not require medical attention (report to your doctor or health care professional if they continue or are bothersome): diarrhea feeling achy headache irritation at site where injected nausea, vomiting stomach upset tiredness This list may not describe all possible side effects. Call your doctor for medical  advice about side effects. You may report side effects to FDA at 1-800-FDA-1088. Where should I keep my medicine? This drug is given in a hospital or clinic and will not be stored at home. NOTE: This sheet is a summary. It may not cover all possible information. If you have questions about this medicine, talk to your doctor, pharmacist, or health care provider.  2020 Elsevier/Gold Standard (2011-04-09 17:14:35)

## 2020-06-07 NOTE — Progress Notes (Signed)
Okay to treat with elevated creat per Dr. Shadad. 

## 2020-06-10 NOTE — Progress Notes (Signed)
CARDIOLOGY OFFICE NOTE  Date:  06/24/2020    [name] Date of Birth: [birth year] Medical Record #[id number]  PCP:  [name], Joseph Halsted, MD  Cardiologist:  [name] & [name]   Chief Complaint  Patient presents with  . Follow-up    Seen for [name]    History of Present Illness: [name] is a 64 y.o. male who presents today for a follow up visit.  Seen for [name].  He has a history ofCAD s/p CABG 2002, HTN,dilated cardiomyopathy, HLD andlong standingpast noncompliance with follow-up/meds. Echocardiogram in 2009 showed significant improvement in his LV function and EF was normal. Cardiac catheterization in 2006 demonstrated diseasebut he wasnot a candidate for further revascularization. Remote CTA abdomen showed 50% left renal artery stenosis in 2006 as well. Follow up renal duplex was ok.  I have followed him for many years - he tended to go long periods between visits despite our recommendations. He ended up being admitted in May of 2019 with marked HTN and CHF requiring aggressive diuresis. Echo showed EF of 50 to 55% with DD. He has been found to have diabetes - took several visits to convince him to see Endocrine. His medicines always tend to be unclear as to what he is exactly taking.  Whenseen back in February - total protein elevated - sent to hematology - concern for MM. Noted significant weight loss.When seen back in April - he had retired - he had followed thru with getting primary care, seeing hematology, Renal, etc.He noted he "had more time to go to the doctor since he had retired".He was to have bone marrow biopsy with [name]. He had not started his Norvasc that I had started at the visit prior - but otherwise, felt to be doing ok. When seen in June - he was doing ok - on infusions of Darzalex for his multiple myeloma. BP had improved with the addition of Norvasc. Last visit in October - had been admitted the month  prior with hypoxemia/UTI/sepsis - small PE - was on short course of Eliquis - ARB had been stopped. Unclear etiology of those events. He had not been back to see PCP - I sent him for follow up CXR. BP was surprisingly ok.   Comes in today. Here alone. He says he is ok. His appetite comes and goes - weight is down a few more pounds - he attributes this to his infusions. He is not dizzy. No chest pain. Breathing is ok. 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  He is going to see rheumatology too. He is tolerating his medicines. Has started some iron per his report.   Note that [name] note mentions he is on Eliquis and not aspirin. [name] is on aspirin and not Eliquis.   Past Medical History:  Diagnosis Date  . Anemia   . Arthritis    "left knee" (11/24/2017)  . CAD (coronary artery disease)    CABG 2002  . Cancer [facility])    Multiple Myeloma  . CHF (congestive heart failure) ([place])   . Chronic kidney disease   . Chronic renal insufficiency   . Diabetes mellitus without complication ([place])   . Dilated cardiomyopathy ([place])    echo in 2009 showed improvement with a normal EF  . HTN (hypertension)   . Hyperlipemia   . Hypertension   . Noncompliance   .  PAD (peripheral artery disease) ([place])   . Pneumonia     Past Surgical History:  Procedure Laterality Date  . CARDIAC CATHETERIZATION  2002, 2006  . CORONARY ARTERY BYPASS GRAFT  2002   x4 DR. VAN TRIGT  . IR FLUORO GUIDED NEEDLE PLC ASPIRATION/INJECTION LOC  10/24/2019     Medications: Current Meds  Medication Sig  . Accu-Chek Softclix Lancets lancets Use Accu Chek softclix lancets to check blood sugar fasting or 2 hours after a meal every other day.  [name] acyclovir (ZOVIRAX) 400 MG tablet Take 0.5 tablets (200 mg total) by mouth 2 (two) times daily.  [name] amLODipine (NORVASC) 5 MG tablet Take 5 mg by mouth daily.  [name] aspirin EC 81 MG tablet Take  81 mg by mouth daily. Swallow whole.  [name] atorvastatin (LIPITOR) 80 MG tablet Take 80 mg by mouth daily.  . Blood Glucose Monitoring Suppl (ACCU-CHEK GUIDE ME) w/Device KIT 1 each by Does not apply route.  . carvedilol (COREG) 25 MG tablet Take 25 mg by mouth 2 (two) times daily with a meal.  . dexamethasone (DECADRON) 4 MG tablet Take 12 mg by mouth See admin instructions. Take after chemo treatments  . glucose blood (ACCU-CHEK GUIDE) test strip Use Accu Chek test strips as instructed to check blood sugar fasting or two hours after meals, every other day.  . hydrALAZINE (APRESOLINE) 50 MG tablet Take 50 mg by mouth 3 (three) times daily.  [name] ibuprofen (ADVIL,MOTRIN) 200 MG tablet Take 200 mg by mouth every 6 (six) hours as needed for moderate pain (for knee).  . isosorbide mononitrate (IMDUR) 30 MG 24 hr tablet Take 1 tablet (30 mg total) by mouth daily.  [name] losartan (COZAAR) 100 MG tablet Take 100 mg by mouth daily.  . metFORMIN (GLUCOPHAGE-XR) 500 MG 24 hr tablet Take 500 mg by mouth 2 (two) times daily.  . ondansetron (ZOFRAN) 8 MG tablet Take 8 mg by mouth every 8 (eight) hours as needed for nausea or vomiting.  . potassium chloride SA (KLOR-CON) 20 MEQ tablet Take 1 tablet by mouth once daily  . predniSONE (DELTASONE) 20 MG tablet Take 1 tablet (20 mg total) by mouth daily as needed (arthiritis).  . prochlorperazine (COMPAZINE) 10 MG tablet Take 1 tablet (10 mg total) by mouth every 6 (six) hours as needed (Nausea or vomiting).  . repaglinide (PRANDIN) 1 MG tablet Take 1 tablet (1 mg total) by mouth 3 (three) times daily before meals.     Allergies: No Known Allergies  Social History: The patient  reports that he has never smoked. He has never used smokeless tobacco. He reports previous alcohol use. He reports previous drug use.   Family History: The patient's family history includes Diabetes in his mother; Hypertension in his father.   Review of Systems: Please see the history of  present illness.   All other systems are reviewed and negative.   Physical Exam: VS:  BP 120/60 (BP Location: Left Arm, Patient Position: Sitting, Cuff Size: Normal)   Pulse 76   Ht _0  (1.778 m)   Wt 162 lb (73.5 kg)   SpO2 98%   BMI 23.24 kg/m  .  BMI Body mass index is 23.24 kg/m.  Wt Readings from Last 3 Encounters:  06/24/20 162 lb (73.5 kg)  06/12/20 168 lb 8 oz (76.4 kg)  06/07/20 171 lb 8 oz (77.8 kg)    General: Alert and in no acute distress.  He has lost 11 pounds since I  last saw him.  HEENT: Normal.  Neck: Supple, no JVD, carotid bruits, or masses noted.  Cardiac: Regular rate and rhythm. No murmurs, rubs, or gallops. No edema.  Respiratory:  Lungs are clear to auscultation bilaterally with normal work of breathing.  GI: Soft and nontender.  MS: No deformity or atrophy. Gait and ROM intact.  Skin: Warm and dry. Color is normal.  Neuro:  Strength and sensation are intact and no gross focal deficits noted.  Psych: Alert, appropriate and with normal affect.   LABORATORY DATA:  EKG:  EKG is not ordered today.    Lab Results  Component Value Date   WBC 5.9 06/17/2020   HGB 8.4 (L) 06/17/2020   HCT 26.3 (L) 06/17/2020   PLT 287 06/17/2020   GLUCOSE 130 (H) 06/17/2020   CHOL 92 (L) 09/04/2019   TRIG 89 09/04/2019   HDL 35 (L) 09/04/2019   LDLDIRECT 149.4 01/07/2011   LDLCALC 39 09/04/2019   ALT <6 06/17/2020   AST 7 (L) 06/17/2020   NA 141 06/17/2020   K 4.1 06/17/2020   CL 109 06/17/2020   CREATININE 2.04 (H) 06/17/2020   BUN 16 06/17/2020   CO2 25 06/17/2020   TSH 0.355 03/13/2020   INR 1.1 03/13/2020   HGBA1C 9.7 (H) 05/03/2020   MICROALBUR 12.6 (H) 07/21/2018     BNP (last 3 results) Recent Labs    03/13/20 0642  BNP 419.0*    ProBNP (last 3 results) No results for input(s): PROBNP in the last 8760 hours.   Other Studies Reviewed Today:  Echo 11/25/2017 LV EF: 50% - 55% Study Conclusions  - Left ventricle: The cavity size  was normal. There was moderate concentric hypertrophy. Systolic function was normal. The estimated ejection fraction was in the range of 50% to 55%. Wall motion was normal; there were no regional wall motion abnormalities. Doppler parameters are consistent with abnormal left ventricular relaxation (grade 1 diastolic dysfunction). Doppler parameters are consistent with high ventricular filling pressure. - Aortic valve: Transvalvular velocity was within the normal range. There was no stenosis. There was no regurgitation. - Mitral valve: Transvalvular velocity was within the normal range. There was no evidence for stenosis. There was no regurgitation. - Left atrium: The atrium was moderately dilated. - Right ventricle: The cavity size was normal. Wall thickness was normal. Systolic function was normal. - Right atrium: The atrium was severely dilated. - Atrial septum: No defect or patent foramen ovale was identified by color flow Doppler. - Tricuspid valve: There was trivial regurgitation. - Pulmonary arteries: Systolic pressure was within the normal range. PA peak pressure: 36 mm Hg (S).   Assessment & Plan:  1. Ischemic heart disease - managed medically - no worrisome symptoms noted - favor continued medical management.   2. HTN - BP is great - he is taking his medicines. I suspect his weight loss has had impact as well. He is not dizzy.   3. Multiple Myeloma - per Oncology. He continues to have steady weight loss.   4. Prior bout of sepsis/hypoxia - unclear etiology - recovered. Follow up CXR was clear.   5. Chronic diastolic HF - seems compensated.   6. CKD - follows with Renal.   7. HLD - on statin therapy  8. DM - per Endocrine.    Current medicines are reviewed with the patient today.  The patient does not have concerns regarding medicines other than what has been noted above.  The following changes have been made:  See above.  Labs/ tests  ordered today include:   No orders of the defined types were placed in this encounter.    Disposition:   FU with [name] in 4 months. He is aware that I am leaving in February. I have sent a message to [name] regarding the aspirin/Eliquis - he sees him again later this month.    Patient is agreeable to this plan and will call if any problems develop in the interim.   SignedTruitt Merle, NP  06/24/2020 3:05 PM  [facility] [address] [place], [place]  [address] Phone: [phone] Fax: [phone]

## 2020-06-11 NOTE — Progress Notes (Signed)
HEMATOLOGY/ONCOLOGY CLINIC NOTE  Date of Service: 06/12/2020  Patient Care Team: Isaac Bliss, Rayford Halsted, MD as PCP - General (Internal Medicine) Martinique, Peter M, MD as PCP - Cardiology (Cardiology) Isaac Bliss, Rayford Halsted, MD (Internal Medicine)  CHIEF COMPLAINTS/PURPOSE OF CONSULTATION:  Continued mx of myeloma  HISTORY OF PRESENTING ILLNESS:  [name] is a wonderful 64 y.o. male who has been referred to [place] by Dr Servando Snare for evaluation and management of elevated protein. Pt is accompanied today by his wife, Mrs. Rion. The pt reports that he is doing well overall.    The pt reports he was first told that he had Diabetes two years ago. He has not previously needed to be on medications for his diabetes but has recently been started on Januvia by Dr. Elayne Snare, his Endocrinologist. His blood glucose was 149 this morning. He sees Burtis Junes - NP and Dr. Martinique for Cardiology. Pt has CAD and had to have open heart surgery 20 years ago. He also has HTN that he feels has been stable. His Cardiology team is currently managing his heart medications and recently told pt to discontinue taking Furosemide due kidney function on last labs. Dr. Martinique and Truitt Merle have been essentially functioning as his primary care, as he does not have a PCP at this time. He has an upcoming appointment with Truitt Merle in the beginning of May and Dr. [name] next Tuesday. Pt is currently using Ibuprofen a couple of times per week for his knee pain.   Pt has felt the same in the last 3-6 months and denies any new bone pain of any kinds. He is eating, functioning, and moving about as normal. He has lost about 13 lbs over the last few months. Pt does not smoke and does not drink much alcohol.    Most recent lab results (09/04/2019) of CBC, BMP and Hepatic function panel is as follows: all values are WNL except for RBC at 3.64, Hgb at 9.8, HCT at 29.8, Glucose at 129, BUN at 64, Creatinine at  2.62, GFR Est Af Am at 29, CO2 at 15, Total Protein at 10.0, Albumin at 3.6, ALP at 163, ALT at 45.    On review of systems, pt reports unexpected weight loss, left knee pain and denies new back pain, new shoulder pain, new hip pain, chest pain, SOB, leg swelling, testicular pain/swelling and any other symptoms.    On PMHx the pt reports Left Knee Arthritis, CAD, CHF, Type II Diabetes, HTN, HLD, Cardiac Catheterization, Coronary Artery Bypass Graft x4 (2002). On Social Hx the pt reports that he is a non-smoker and does not drink much alcohol.  INTERVAL HISTORY:   [name] is a wonderful 64 y.o. male is here for evaluation and management of Multiple Myeloma. The patient's last visit with [place] was on 05/29/2020. The pt reports that he is doing well overall.  The pt reports that he has felt well in the interim. Pt denies any new concerns and notes an improvement in joint pain and swelling.  He has tolerated the IV Iron infusions well.  Lab results today (06/12/20) of CBC w/diff and CMP is as follows: all values are WNL except for RBC at 3.36, Hgb at 8.5, HCT at 27.0, MCH at 25.3, RDW at 18.6, Potassium at 3.3, Glucose at 207, Creatinine at 1.91, Calcium at 8.7, Albumin at 2.6, AST at <6, GFR Est at 39.  On review of systems, pt denies fatigue, lightheadeness,  dizziness, joint swelling, abdominal pain, leg swelling, bone pain, shortness of breath and any other symptoms.    MEDICAL HISTORY:  Past Medical History:  Diagnosis Date  . Anemia   . Arthritis    "left knee" (11/24/2017)  . CAD (coronary artery disease)    CABG 2002  . Cancer [facility])    Multiple Myeloma  . CHF (congestive heart failure) ([place])   . Chronic kidney disease   . Chronic renal insufficiency   . Diabetes mellitus without complication ([place])   . Dilated cardiomyopathy ([place])    echo in 2009 showed improvement with a normal EF  . HTN (hypertension)   . Hyperlipemia   . Hypertension   . Noncompliance   . PAD  (peripheral artery disease) ([place])   . Pneumonia     SURGICAL HISTORY: Past Surgical History:  Procedure Laterality Date  . CARDIAC CATHETERIZATION  2002, 2006  . CORONARY ARTERY BYPASS GRAFT  2002   x4 DR. VAN TRIGT  . IR FLUORO GUIDED NEEDLE PLC ASPIRATION/INJECTION LOC  10/24/2019    SOCIAL HISTORY: Social History   Socioeconomic History  . Marital status: Married    Spouse name: Not on file  . Number of children: 1  . Years of education: Not on file  . Highest education level: Not on file  Occupational History  . Occupation: Furniture conservator/restorer  Tobacco Use  . Smoking status: Never Smoker  . Smokeless tobacco: Never Used  Vaping Use  . Vaping Use: Never used  Substance and Sexual Activity  . Alcohol use: Not Currently  . Drug use: Not Currently  . Sexual activity: Yes  Other Topics Concern  . Not on file  Social History Narrative   ** Merged History Encounter **       Social Determinants of Health   Financial Resource Strain:   . Difficulty of Paying Living Expenses: Not on file  Food Insecurity:   . Worried About Charity fundraiser in the Last Year: Not on file  . Ran Out of Food in the Last Year: Not on file  Transportation Needs:   . Lack of Transportation (Medical): Not on file  . Lack of Transportation (Non-Medical): Not on file  Physical Activity:   . Days of Exercise per Week: Not on file  . Minutes of Exercise per Session: Not on file  Stress:   . Feeling of Stress : Not on file  Social Connections:   . Frequency of Communication with Friends and Family: Not on file  . Frequency of Social Gatherings with Friends and Family: Not on file  . Attends Religious Services: Not on file  . Active Member of Clubs or Organizations: Not on file  . Attends Archivist Meetings: Not on file  . Marital Status: Not on file  Intimate Partner Violence:   . Fear of Current or Ex-Partner: Not on file  . Emotionally Abused: Not on file  . Physically Abused: Not on  file  . Sexually Abused: Not on file    FAMILY HISTORY: Family History  Problem Relation Age of Onset  . Hypertension Father   . Diabetes Mother        DIABETIC COMA    ALLERGIES:  has No Known Allergies.  MEDICATIONS:  Current Outpatient Medications  Medication Sig Dispense Refill  . Accu-Chek Softclix Lancets lancets Use Accu Chek softclix lancets to check blood sugar fasting or 2 hours after a meal every other day. 100 each 2  . acyclovir (ZOVIRAX)  400 MG tablet Take 0.5 tablets (200 mg total) by mouth 2 (two) times daily. 60 tablet 11  . amLODipine (NORVASC) 5 MG tablet Take 5 mg by mouth daily.    [name] aspirin EC 81 MG tablet Take 81 mg by mouth daily. Swallow whole.    [name] atorvastatin (LIPITOR) 80 MG tablet Take 80 mg by mouth daily.    . Blood Glucose Monitoring Suppl (ACCU-CHEK GUIDE ME) w/Device KIT 1 each by Does not apply route.    . carvedilol (COREG) 25 MG tablet Take 25 mg by mouth 2 (two) times daily with a meal.    . dexamethasone (DECADRON) 4 MG tablet Take 12 mg by mouth See admin instructions. Take after chemo treatments    . glucose blood (ACCU-CHEK GUIDE) test strip Use Accu Chek test strips as instructed to check blood sugar fasting or two hours after meals, every other day. 100 each 2  . hydrALAZINE (APRESOLINE) 50 MG tablet Take 50 mg by mouth 3 (three) times daily.    [name] ibuprofen (ADVIL,MOTRIN) 200 MG tablet Take 200 mg by mouth every 6 (six) hours as needed for moderate pain (for knee).    . isosorbide mononitrate (IMDUR) 30 MG 24 hr tablet Take 1 tablet (30 mg total) by mouth daily. 90 tablet 2  . losartan (COZAAR) 100 MG tablet Take 100 mg by mouth daily.    . metFORMIN (GLUCOPHAGE-XR) 500 MG 24 hr tablet Take 500 mg by mouth 2 (two) times daily.    . ondansetron (ZOFRAN) 8 MG tablet Take 8 mg by mouth every 8 (eight) hours as needed for nausea or vomiting.    . potassium chloride SA (KLOR-CON) 20 MEQ tablet Take 1 tablet by mouth once daily 30 tablet 3  .  predniSONE (DELTASONE) 20 MG tablet Take 1 tablet (20 mg total) by mouth daily as needed (arthiritis). 30 tablet 3  . prochlorperazine (COMPAZINE) 10 MG tablet Take 1 tablet (10 mg total) by mouth every 6 (six) hours as needed (Nausea or vomiting). 30 tablet 1  . repaglinide (PRANDIN) 1 MG tablet Take 1 tablet (1 mg total) by mouth 3 (three) times daily before meals. 90 tablet 1   No current facility-administered medications for this visit.   Facility-Administered Medications Ordered in Other Visits  Medication Dose Route Frequency Provider Last Rate Last Admin  . sodium chloride flush (NS) 0.9 % injection 10 mL  10 mL Intravenous PRN Irene Limbo, Cloria Spring, MD        REVIEW OF SYSTEMS:  A 10+ POINT REVIEW OF SYSTEMS WAS OBTAINED including neurology, dermatology, psychiatry, cardiac, respiratory, lymph, extremities, GI, GU, Musculoskeletal, constitutional, breasts, reproductive, HEENT.  All pertinent positives are noted in the HPI.  All others are negative.   PHYSICAL EXAMINATION: ECOG PERFORMANCE STATUS: 0 - Asymptomatic  VS reviewed - stable  GENERAL:alert, in no acute distress and comfortable SKIN: no acute rashes, no significant lesions EYES: conjunctiva are pink and non-injected, sclera anicteric OROPHARYNX: MMM, no exudates, no oropharyngeal erythema or ulceration NECK: supple, no JVD LYMPH:  no palpable lymphadenopathy in the cervical, axillary or inguinal regions LUNGS: clear to auscultation b/l with normal respiratory effort HEART: regular rate & rhythm ABDOMEN:  normoactive bowel sounds , non tender, not distended. No palpable hepatosplenomegaly.  Extremity: no pedal edema PSYCH: alert & oriented x 3 with fluent speech NEURO: no focal motor/sensory deficits  LABORATORY DATA:  I have reviewed the data as listed  . CBC Latest Ref Rng & Units 06/12/2020 06/07/2020  05/29/2020  WBC 4.0 - 10.5 K/uL 5.0 6.1 6.9  Hemoglobin 13.0 - 17.0 g/dL 8.5(L) 8.6(L) 8.8(L)  Hematocrit 39 -  52 % 27.0(L) 27.7(L) 27.6(L)  Platelets 150 - 400 K/uL 229 293 351    . CMP Latest Ref Rng & Units 06/12/2020 06/07/2020 05/29/2020  Glucose 70 - 99 mg/dL 207(H) 113(H) 104(H)  BUN 8 - 23 mg/dL $Remove'16 20 16  'qOQOSuz$ Creatinine 0.61 - 1.24 mg/dL 1.91(H) 1.73(H) 1.54(H)  Sodium 135 - 145 mmol/L 138 144 142  Potassium 3.5 - 5.1 mmol/L 3.3(L) 3.8 3.4(L)  Chloride 98 - 111 mmol/L 108 113(H) 110  CO2 22 - 32 mmol/L $RemoveB'22 23 22  'wwVkXjRc$ Calcium 8.9 - 10.3 mg/dL 8.7(L) 9.0 9.0  Total Protein 6.5 - 8.1 g/dL 6.6 6.8 6.8  Total Bilirubin 0.3 - 1.2 mg/dL 0.5 0.5 0.5  Alkaline Phos 38 - 126 U/L 86 96 77  AST 15 - 41 U/L <6(L) 6(L) 8(L)  ALT 0 - 44 U/L <6 6 <6   10/24/2019 FISH Panel:    10/24/2019 [facility] Surgical Pathology:    RADIOGRAPHIC STUDIES: I have personally reviewed the radiological images as listed and agreed with the findings in the report. DG Chest 2 View  Result Date: 05/25/2020 CLINICAL DATA:  Pneumonia, follow-up examination EXAM: CHEST - 2 VIEW COMPARISON:  04/23/2020 FINDINGS: Lungs are clear. No pneumothorax or pleural effusion. Cardiac size is within normal limits. Coronary artery bypass grafting has been performed. Pulmonary vascularity is normal. Degenerative changes are seen within the thoracic spine. No acute bone abnormality. IMPRESSION: Resolved pulmonary infiltrates. No radiographic evidence of acute cardiopulmonary disease. Electronically Signed   By: Fidela Salisbury MD   On: 05/25/2020 02:26    ASSESSMENT & PLAN:    64 yo with   1)  Newly diagnosed Multiple myeloma -09/27/19 M Protein at 3.4 -10/25/2019 PET/CT ([id number]) revealed "1. No specific myelomatous lesions are identified. 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  2) Anemia ? Related to CKD vs ? Plasma cell dyscrasia 3) CKD ?etiology - HTN ? Cardiac issues ? Myeloma related. 4) recently diagnosed small Pulmonary embolism PLAN: -Discussed pt labwork today, 06/12/20; all values are WNL except for RBC at 3.36, Hgb at 8.5, HCT at 27.0, MCH at 25.3, RDW at 18.6, Potassium at 3.3, Glucose at 207, Creatinine at 1.91, Calcium at 8.7, Albumin at 2.6, AST at <6, GFR Est at 39. -Discussed 05/29/2020 MMP shows M Protein up to 0.7 g/dL -Advised pt that anemia is cause by iron deficiency, treatment, myeloma, and inflammation from RA. -The pt has no prohibitive toxicities from continuing Daratumumab/Carfilzomib/Dexamethasone as scheduled. -Advised pt again that he has high-risk genetics, which would make the consideration of transplant more important.  -Continue Eliquis & hold Aspirin.  -Referral to Dr. Aris Lot at [place] see back in 3 weeks with labs   FOLLOW UP: Please schedule next cycle day 1 starting on 06/18/2020. Please schedule day 8 and day 15 of the cycle as per  treatment orders. Labs with each treatment. MD visit with day 15 of treatment in 3 weeks. Referral to Dr Aris Lot at [facility] for Myeloma transplant considerations   The total time spent in the appt was 30 minutes and more than 50% was on counseling and direct patient cares.  All of the patient's questions were answered with apparent satisfaction. The patient knows to call the clinic with any problems, questions or concerns.    Sullivan Lone MD [place] AAHIVMS [facility] Hematology/Oncology Physician [place]  (Office):        [phone] (Work cell):  [phone] (Fax):           [phone]  06/12/2020 1:16 PM  I, Yevette Edwards, am acting as a scribe for Dr. Sullivan Lone.   .I have reviewed the above documentation for accuracy and completeness, and I agree with the above. Brunetta Genera MD

## 2020-06-12 ENCOUNTER — Inpatient Hospital Stay (HOSPITAL_BASED_OUTPATIENT_CLINIC_OR_DEPARTMENT_OTHER): Payer: Medicare Other | Admitting: Hematology

## 2020-06-12 ENCOUNTER — Inpatient Hospital Stay: Payer: Medicare Other | Attending: Hematology

## 2020-06-12 ENCOUNTER — Other Ambulatory Visit: Payer: Self-pay

## 2020-06-12 ENCOUNTER — Telehealth: Payer: Self-pay | Admitting: *Deleted

## 2020-06-12 VITALS — BP 112/66 | HR 78 | Temp 98.3°F | Resp 18 | Ht 70.0 in | Wt 168.5 lb

## 2020-06-12 DIAGNOSIS — Z951 Presence of aortocoronary bypass graft: Secondary | ICD-10-CM | POA: Diagnosis not present

## 2020-06-12 DIAGNOSIS — M1990 Unspecified osteoarthritis, unspecified site: Secondary | ICD-10-CM | POA: Diagnosis not present

## 2020-06-12 DIAGNOSIS — Z7982 Long term (current) use of aspirin: Secondary | ICD-10-CM | POA: Insufficient documentation

## 2020-06-12 DIAGNOSIS — I2510 Atherosclerotic heart disease of native coronary artery without angina pectoris: Secondary | ICD-10-CM | POA: Insufficient documentation

## 2020-06-12 DIAGNOSIS — Z23 Encounter for immunization: Secondary | ICD-10-CM

## 2020-06-12 DIAGNOSIS — Z5111 Encounter for antineoplastic chemotherapy: Secondary | ICD-10-CM | POA: Insufficient documentation

## 2020-06-12 DIAGNOSIS — C9000 Multiple myeloma not having achieved remission: Secondary | ICD-10-CM | POA: Insufficient documentation

## 2020-06-12 DIAGNOSIS — D649 Anemia, unspecified: Secondary | ICD-10-CM

## 2020-06-12 DIAGNOSIS — Z5112 Encounter for antineoplastic immunotherapy: Secondary | ICD-10-CM | POA: Diagnosis present

## 2020-06-12 DIAGNOSIS — Z79899 Other long term (current) drug therapy: Secondary | ICD-10-CM | POA: Diagnosis not present

## 2020-06-12 DIAGNOSIS — E119 Type 2 diabetes mellitus without complications: Secondary | ICD-10-CM | POA: Insufficient documentation

## 2020-06-12 DIAGNOSIS — E785 Hyperlipidemia, unspecified: Secondary | ICD-10-CM | POA: Insufficient documentation

## 2020-06-12 DIAGNOSIS — I739 Peripheral vascular disease, unspecified: Secondary | ICD-10-CM | POA: Diagnosis not present

## 2020-06-12 DIAGNOSIS — I110 Hypertensive heart disease with heart failure: Secondary | ICD-10-CM | POA: Insufficient documentation

## 2020-06-12 DIAGNOSIS — I509 Heart failure, unspecified: Secondary | ICD-10-CM | POA: Diagnosis not present

## 2020-06-12 LAB — CMP (CANCER CENTER ONLY)
ALT: <6 U/L (ref 0–44)
AST: <6 U/L — ABNORMAL LOW (ref 15–41)
Albumin: 2.6 g/dL — ABNORMAL LOW (ref 3.5–5.0)
Alkaline Phosphatase: 86 U/L (ref 38–126)
Anion gap: 8 (ref 5–15)
BUN: 16 mg/dL (ref 8–23)
CO2: 22 mmol/L (ref 22–32)
Calcium: 8.7 mg/dL — ABNORMAL LOW (ref 8.9–10.3)
Chloride: 108 mmol/L (ref 98–111)
Creatinine: 1.91 mg/dL — ABNORMAL HIGH (ref 0.61–1.24)
GFR, Estimated: 39 mL/min — ABNORMAL LOW (ref >60)
Glucose, Bld: 207 mg/dL — ABNORMAL HIGH (ref 70–99)
Potassium: 3.3 mmol/L — ABNORMAL LOW (ref 3.5–5.1)
Sodium: 138 mmol/L (ref 135–145)
Total Bilirubin: 0.5 mg/dL (ref 0.3–1.2)
Total Protein: 6.6 g/dL (ref 6.5–8.1)

## 2020-06-12 LAB — CBC WITH DIFFERENTIAL/PLATELET
Abs Immature Granulocytes: 0.03 10*3/uL (ref 0.00–0.07)
Basophils Absolute: 0 10*3/uL (ref 0.0–0.1)
Basophils Relative: 0 %
Eosinophils Absolute: 0.1 10*3/uL (ref 0.0–0.5)
Eosinophils Relative: 1 %
HCT: 27 % — ABNORMAL LOW (ref 39.0–52.0)
Hemoglobin: 8.5 g/dL — ABNORMAL LOW (ref 13.0–17.0)
Immature Granulocytes: 1 %
Lymphocytes Relative: 24 %
Lymphs Abs: 1.2 10*3/uL (ref 0.7–4.0)
MCH: 25.3 pg — ABNORMAL LOW (ref 26.0–34.0)
MCHC: 31.5 g/dL (ref 30.0–36.0)
MCV: 80.4 fL (ref 80.0–100.0)
Monocytes Absolute: 0.5 10*3/uL (ref 0.1–1.0)
Monocytes Relative: 11 %
Neutro Abs: 3.1 10*3/uL (ref 1.7–7.7)
Neutrophils Relative %: 63 %
Platelets: 229 10*3/uL (ref 150–400)
RBC: 3.36 MIL/uL — ABNORMAL LOW (ref 4.22–5.81)
RDW: 18.6 % — ABNORMAL HIGH (ref 11.5–15.5)
WBC: 5 10*3/uL (ref 4.0–10.5)
nRBC: 0 % (ref 0.0–0.2)

## 2020-06-12 NOTE — Telephone Encounter (Signed)
Dr. Irene Limbo referred patient to [facility]. Aris Lot. Referral packet faxed to [phone]: Referral; Demographics; insurance info; most recent OV note and labs. Fax confirmation received.

## 2020-06-13 ENCOUNTER — Ambulatory Visit: Payer: No Typology Code available for payment source

## 2020-06-14 ENCOUNTER — Telehealth: Payer: Self-pay | Admitting: Hematology

## 2020-06-14 NOTE — Telephone Encounter (Signed)
Scheduled appt per 12/2 sch msg - pt is aware of appts scheduled for 12/6 and 12/7

## 2020-06-17 ENCOUNTER — Inpatient Hospital Stay: Payer: Medicare Other

## 2020-06-17 ENCOUNTER — Other Ambulatory Visit: Payer: Self-pay

## 2020-06-17 DIAGNOSIS — D649 Anemia, unspecified: Secondary | ICD-10-CM

## 2020-06-17 DIAGNOSIS — Z5112 Encounter for antineoplastic immunotherapy: Secondary | ICD-10-CM

## 2020-06-17 DIAGNOSIS — C9000 Multiple myeloma not having achieved remission: Secondary | ICD-10-CM

## 2020-06-17 DIAGNOSIS — Z5111 Encounter for antineoplastic chemotherapy: Secondary | ICD-10-CM | POA: Diagnosis not present

## 2020-06-17 LAB — CBC WITH DIFFERENTIAL/PLATELET
Abs Immature Granulocytes: 0.02 10*3/uL (ref 0.00–0.07)
Basophils Absolute: 0 10*3/uL (ref 0.0–0.1)
Basophils Relative: 0 %
Eosinophils Absolute: 0.1 10*3/uL (ref 0.0–0.5)
Eosinophils Relative: 2 %
HCT: 26.3 % — ABNORMAL LOW (ref 39.0–52.0)
Hemoglobin: 8.4 g/dL — ABNORMAL LOW (ref 13.0–17.0)
Immature Granulocytes: 0 %
Lymphocytes Relative: 28 %
Lymphs Abs: 1.6 10*3/uL (ref 0.7–4.0)
MCH: 25.3 pg — ABNORMAL LOW (ref 26.0–34.0)
MCHC: 31.9 g/dL (ref 30.0–36.0)
MCV: 79.2 fL — ABNORMAL LOW (ref 80.0–100.0)
Monocytes Absolute: 0.6 10*3/uL (ref 0.1–1.0)
Monocytes Relative: 10 %
Neutro Abs: 3.6 10*3/uL (ref 1.7–7.7)
Neutrophils Relative %: 60 %
Platelets: 287 10*3/uL (ref 150–400)
RBC: 3.32 MIL/uL — ABNORMAL LOW (ref 4.22–5.81)
RDW: 17.6 % — ABNORMAL HIGH (ref 11.5–15.5)
WBC: 5.9 10*3/uL (ref 4.0–10.5)
nRBC: 0 % (ref 0.0–0.2)

## 2020-06-17 LAB — CMP (CANCER CENTER ONLY)
ALT: <6 U/L (ref 0–44)
AST: 7 U/L — ABNORMAL LOW (ref 15–41)
Albumin: 2.6 g/dL — ABNORMAL LOW (ref 3.5–5.0)
Alkaline Phosphatase: 74 U/L (ref 38–126)
Anion gap: 7 (ref 5–15)
BUN: 16 mg/dL (ref 8–23)
CO2: 25 mmol/L (ref 22–32)
Calcium: 9.1 mg/dL (ref 8.9–10.3)
Chloride: 109 mmol/L (ref 98–111)
Creatinine: 2.04 mg/dL — ABNORMAL HIGH (ref 0.61–1.24)
GFR, Estimated: 36 mL/min — ABNORMAL LOW (ref >60)
Glucose, Bld: 130 mg/dL — ABNORMAL HIGH (ref 70–99)
Potassium: 4.1 mmol/L (ref 3.5–5.1)
Sodium: 141 mmol/L (ref 135–145)
Total Bilirubin: 0.6 mg/dL (ref 0.3–1.2)
Total Protein: 7 g/dL (ref 6.5–8.1)

## 2020-06-18 ENCOUNTER — Inpatient Hospital Stay: Payer: Medicare Other

## 2020-06-18 ENCOUNTER — Other Ambulatory Visit: Payer: Self-pay

## 2020-06-18 VITALS — BP 130/63 | HR 71 | Temp 97.8°F | Resp 18

## 2020-06-18 DIAGNOSIS — C9000 Multiple myeloma not having achieved remission: Secondary | ICD-10-CM

## 2020-06-18 DIAGNOSIS — Z7189 Other specified counseling: Secondary | ICD-10-CM

## 2020-06-18 DIAGNOSIS — D508 Other iron deficiency anemias: Secondary | ICD-10-CM

## 2020-06-18 DIAGNOSIS — Z5111 Encounter for antineoplastic chemotherapy: Secondary | ICD-10-CM | POA: Diagnosis not present

## 2020-06-18 MED ORDER — SODIUM CHLORIDE 0.9 % IV SOLN
40.0000 mg | Freq: Once | INTRAVENOUS | Status: AC
  Administered 2020-06-18: 40 mg via INTRAVENOUS
  Filled 2020-06-18: qty 4

## 2020-06-18 MED ORDER — METHYLPREDNISOLONE SODIUM SUCC 125 MG IJ SOLR
INTRAMUSCULAR | Status: AC
  Filled 2020-06-18: qty 2

## 2020-06-18 MED ORDER — SODIUM CHLORIDE 0.9 % IV SOLN
Freq: Once | INTRAVENOUS | Status: AC
  Filled 2020-06-18: qty 250

## 2020-06-18 MED ORDER — DIPHENHYDRAMINE HCL 25 MG PO CAPS
50.0000 mg | ORAL_CAPSULE | Freq: Once | ORAL | Status: AC
  Administered 2020-06-18: 50 mg via ORAL

## 2020-06-18 MED ORDER — MEPERIDINE HCL 25 MG/ML IJ SOLN
INTRAMUSCULAR | Status: AC
  Filled 2020-06-18: qty 1

## 2020-06-18 MED ORDER — DEXTROSE 5 % IV SOLN
27.0000 mg/m2 | Freq: Once | INTRAVENOUS | Status: AC
  Administered 2020-06-18: 54 mg via INTRAVENOUS
  Filled 2020-06-18: qty 27

## 2020-06-18 MED ORDER — SODIUM CHLORIDE 0.9 % IV SOLN
16.0000 mg/kg | Freq: Once | INTRAVENOUS | Status: AC
  Administered 2020-06-18: 1300 mg via INTRAVENOUS
  Filled 2020-06-18: qty 60

## 2020-06-18 MED ORDER — ACETAMINOPHEN 325 MG PO TABS
650.0000 mg | ORAL_TABLET | Freq: Once | ORAL | Status: AC
  Administered 2020-06-18: 650 mg via ORAL

## 2020-06-18 MED ORDER — DIPHENHYDRAMINE HCL 25 MG PO CAPS
ORAL_CAPSULE | ORAL | Status: AC
  Filled 2020-06-18: qty 2

## 2020-06-18 MED ORDER — METHYLPREDNISOLONE SODIUM SUCC 125 MG IJ SOLR
100.0000 mg | Freq: Once | INTRAMUSCULAR | Status: AC
  Administered 2020-06-18: 100 mg via INTRAVENOUS

## 2020-06-18 MED ORDER — FAMOTIDINE 20 MG PO TABS
ORAL_TABLET | ORAL | Status: AC
  Filled 2020-06-18: qty 2

## 2020-06-18 MED ORDER — SODIUM CHLORIDE 0.9 % IV SOLN
200.0000 mg | Freq: Once | INTRAVENOUS | Status: AC
  Administered 2020-06-18: 200 mg via INTRAVENOUS
  Filled 2020-06-18: qty 200

## 2020-06-18 MED ORDER — MONTELUKAST SODIUM 10 MG PO TABS
ORAL_TABLET | ORAL | Status: AC
  Filled 2020-06-18: qty 1

## 2020-06-18 MED ORDER — ACETAMINOPHEN 325 MG PO TABS
ORAL_TABLET | ORAL | Status: AC
  Filled 2020-06-18: qty 2

## 2020-06-18 MED ORDER — MONTELUKAST SODIUM 10 MG PO TABS
10.0000 mg | ORAL_TABLET | Freq: Once | ORAL | Status: AC
  Administered 2020-06-18: 10 mg via ORAL

## 2020-06-18 MED ORDER — MEPERIDINE HCL 25 MG/ML IJ SOLN
25.0000 mg | Freq: Once | INTRAMUSCULAR | Status: AC
  Administered 2020-06-18: 25 mg via INTRAVENOUS

## 2020-06-18 NOTE — Progress Notes (Signed)
Ok to treat today with elevated Creatinine per Dr. Julien Nordmann.

## 2020-06-18 NOTE — Patient Instructions (Signed)
[place] Discharge Instructions for Patients Receiving Chemotherapy  Today you received the following chemotherapy agents:kyprolis, darzalex  To help prevent nausea and vomiting after your treatment, we encourage you to take your nausea medication as directed.    If you develop nausea and vomiting that is not controlled by your nausea medication, call the clinic.   BELOW ARE SYMPTOMS THAT SHOULD BE REPORTED IMMEDIATELY:  *FEVER GREATER THAN 100.5 F  *CHILLS WITH OR WITHOUT FEVER  NAUSEA AND VOMITING THAT IS NOT CONTROLLED WITH YOUR NAUSEA MEDICATION  *UNUSUAL SHORTNESS OF BREATH  *UNUSUAL BRUISING OR BLEEDING  TENDERNESS IN MOUTH AND THROAT WITH OR WITHOUT PRESENCE OF ULCERS  *URINARY PROBLEMS  *BOWEL PROBLEMS  UNUSUAL RASH Items with * indicate a potential emergency and should be followed up as soon as possible.  Feel free to call the clinic should you have any questions or concerns. The clinic phone number is [phone].  Please show the [place] at check-in to the Emergency Department and triage nurse.

## 2020-06-19 NOTE — Progress Notes (Signed)
..  The following Medication: Venofer is approved for drug replacement program by [employer]. The enrollment period is from 06/19/2020 to 06/18/2021.  Reason for Assistance: Self Pay/ No Chemo Infusion Benefit. ID: [id number] First DOS:06/07/2020

## 2020-06-23 ENCOUNTER — Other Ambulatory Visit: Payer: Self-pay | Admitting: Nurse Practitioner

## 2020-06-24 ENCOUNTER — Ambulatory Visit (INDEPENDENT_AMBULATORY_CARE_PROVIDER_SITE_OTHER): Payer: No Typology Code available for payment source | Admitting: Nurse Practitioner

## 2020-06-24 ENCOUNTER — Encounter: Payer: Self-pay | Admitting: Nurse Practitioner

## 2020-06-24 ENCOUNTER — Other Ambulatory Visit: Payer: Self-pay

## 2020-06-24 ENCOUNTER — Inpatient Hospital Stay: Payer: Medicare Other | Admitting: Hematology

## 2020-06-24 ENCOUNTER — Inpatient Hospital Stay: Payer: Medicare Other

## 2020-06-24 VITALS — BP 120/60 | HR 76 | Ht 70.0 in | Wt 162.0 lb

## 2020-06-24 DIAGNOSIS — R634 Abnormal weight loss: Secondary | ICD-10-CM | POA: Diagnosis not present

## 2020-06-24 DIAGNOSIS — N184 Chronic kidney disease, stage 4 (severe): Secondary | ICD-10-CM

## 2020-06-24 DIAGNOSIS — I5032 Chronic diastolic (congestive) heart failure: Secondary | ICD-10-CM

## 2020-06-24 DIAGNOSIS — I259 Chronic ischemic heart disease, unspecified: Secondary | ICD-10-CM

## 2020-06-24 DIAGNOSIS — E785 Hyperlipidemia, unspecified: Secondary | ICD-10-CM

## 2020-06-24 DIAGNOSIS — I10 Essential (primary) hypertension: Secondary | ICD-10-CM

## 2020-06-24 NOTE — Progress Notes (Signed)
..  The following Medication: [name] is approved for drug replacement program by [employer]. The enrollment period is from 06/24/2020 to 06/24/2021.  Reason for Assistance: Self Pay/No Chemo Infusion Benefit. ID: [id number] First DOS:06/07/2020.

## 2020-06-24 NOTE — Patient Instructions (Addendum)
After Visit Summary:  We will be checking the following labs today - NONE   Medication Instructions:    Continue with your current medicines.    If you need a refill on your cardiac medications before your next appointment, please call your pharmacy.     Testing/Procedures To Be Arranged:  N/A  Follow-Up:   See Dr. Peter Martinique in 4 months. He is at the [facility] office (the building where [employer] is).     At [facility], you and your health needs are our priority.  As part of our continuing mission to provide you with exceptional heart care, we have created designated Provider Care Teams.  These Care Teams include your primary Cardiologist (physician) and Advanced Practice Providers (APPs -  Physician Assistants and Nurse Practitioners) who all work together to provide you with the care you need, when you need it.  Special Instructions:  . Stay safe, wash your hands for at least 20 seconds and wear a mask when needed.  . It was good to talk with you today.    Call the [place] office at [phone] if you have any questions, problems or concerns.

## 2020-06-25 ENCOUNTER — Inpatient Hospital Stay: Payer: Medicare Other

## 2020-06-25 ENCOUNTER — Other Ambulatory Visit: Payer: Self-pay

## 2020-06-25 VITALS — BP 156/77 | HR 85 | Temp 98.3°F | Resp 17

## 2020-06-25 DIAGNOSIS — C9000 Multiple myeloma not having achieved remission: Secondary | ICD-10-CM

## 2020-06-25 DIAGNOSIS — Z5111 Encounter for antineoplastic chemotherapy: Secondary | ICD-10-CM | POA: Diagnosis not present

## 2020-06-25 DIAGNOSIS — Z7189 Other specified counseling: Secondary | ICD-10-CM

## 2020-06-25 DIAGNOSIS — D649 Anemia, unspecified: Secondary | ICD-10-CM

## 2020-06-25 LAB — CBC WITH DIFFERENTIAL/PLATELET
Abs Immature Granulocytes: 0.09 10*3/uL — ABNORMAL HIGH (ref 0.00–0.07)
Basophils Absolute: 0 10*3/uL (ref 0.0–0.1)
Basophils Relative: 0 %
Eosinophils Absolute: 0.1 10*3/uL (ref 0.0–0.5)
Eosinophils Relative: 1 %
HCT: 26.7 % — ABNORMAL LOW (ref 39.0–52.0)
Hemoglobin: 8.5 g/dL — ABNORMAL LOW (ref 13.0–17.0)
Immature Granulocytes: 2 %
Lymphocytes Relative: 17 %
Lymphs Abs: 0.9 10*3/uL (ref 0.7–4.0)
MCH: 25 pg — ABNORMAL LOW (ref 26.0–34.0)
MCHC: 31.8 g/dL (ref 30.0–36.0)
MCV: 78.5 fL — ABNORMAL LOW (ref 80.0–100.0)
Monocytes Absolute: 0.7 10*3/uL (ref 0.1–1.0)
Monocytes Relative: 12 %
Neutro Abs: 3.9 10*3/uL (ref 1.7–7.7)
Neutrophils Relative %: 68 %
Platelets: 304 10*3/uL (ref 150–400)
RBC: 3.4 MIL/uL — ABNORMAL LOW (ref 4.22–5.81)
RDW: 17.3 % — ABNORMAL HIGH (ref 11.5–15.5)
WBC: 5.6 10*3/uL (ref 4.0–10.5)
nRBC: 0 % (ref 0.0–0.2)

## 2020-06-25 LAB — CMP (CANCER CENTER ONLY)
ALT: 7 U/L (ref 0–44)
AST: 6 U/L — ABNORMAL LOW (ref 15–41)
Albumin: 2.7 g/dL — ABNORMAL LOW (ref 3.5–5.0)
Alkaline Phosphatase: 75 U/L (ref 38–126)
Anion gap: 7 (ref 5–15)
BUN: 22 mg/dL (ref 8–23)
CO2: 19 mmol/L — ABNORMAL LOW (ref 22–32)
Calcium: 8.9 mg/dL (ref 8.9–10.3)
Chloride: 112 mmol/L — ABNORMAL HIGH (ref 98–111)
Creatinine: 2.29 mg/dL — ABNORMAL HIGH (ref 0.61–1.24)
GFR, Estimated: 31 mL/min — ABNORMAL LOW (ref >60)
Glucose, Bld: 187 mg/dL — ABNORMAL HIGH (ref 70–99)
Potassium: 3.3 mmol/L — ABNORMAL LOW (ref 3.5–5.1)
Sodium: 138 mmol/L (ref 135–145)
Total Bilirubin: 0.5 mg/dL (ref 0.3–1.2)
Total Protein: 6.9 g/dL (ref 6.5–8.1)

## 2020-06-25 MED ORDER — DEXTROSE 5 % IV SOLN
36.0000 mg/m2 | Freq: Once | INTRAVENOUS | Status: AC
  Administered 2020-06-25: 70 mg via INTRAVENOUS
  Filled 2020-06-25: qty 30

## 2020-06-25 MED ORDER — ACETAMINOPHEN 325 MG PO TABS
ORAL_TABLET | ORAL | Status: AC
  Filled 2020-06-25: qty 2

## 2020-06-25 MED ORDER — ACETAMINOPHEN 325 MG PO TABS
650.0000 mg | ORAL_TABLET | Freq: Once | ORAL | Status: AC
  Administered 2020-06-25: 12:00:00 650 mg via ORAL

## 2020-06-25 MED ORDER — METHYLPREDNISOLONE SODIUM SUCC 125 MG IJ SOLR
INTRAMUSCULAR | Status: AC
  Filled 2020-06-25: qty 2

## 2020-06-25 MED ORDER — SODIUM CHLORIDE 0.9 % IV SOLN
Freq: Once | INTRAVENOUS | Status: AC
  Filled 2020-06-25: qty 250

## 2020-06-25 MED ORDER — DIPHENHYDRAMINE HCL 25 MG PO CAPS
ORAL_CAPSULE | ORAL | Status: AC
  Filled 2020-06-25: qty 1

## 2020-06-25 MED ORDER — DIPHENHYDRAMINE HCL 25 MG PO CAPS
25.0000 mg | ORAL_CAPSULE | Freq: Once | ORAL | Status: AC
  Administered 2020-06-25: 12:00:00 25 mg via ORAL

## 2020-06-25 MED ORDER — METHYLPREDNISOLONE SODIUM SUCC 125 MG IJ SOLR
60.0000 mg | Freq: Once | INTRAMUSCULAR | Status: AC
  Administered 2020-06-25: 12:00:00 60 mg via INTRAVENOUS

## 2020-06-25 NOTE — Progress Notes (Signed)
Verbal order per Dr. Irene Limbo - OK to receive [facility] Kyprolis today with serum Creatinine 2.29

## 2020-06-25 NOTE — Patient Instructions (Signed)
[facility] Discharge Instructions for Patients Receiving Chemotherapy  Today you received the following chemotherapy agents:  Kyprolis  To help prevent nausea and vomiting after your treatment, we encourage you to take your nausea medication as directed.   If you develop nausea and vomiting that is not controlled by your nausea medication, call the clinic.   BELOW ARE SYMPTOMS THAT SHOULD BE REPORTED IMMEDIATELY:  *FEVER GREATER THAN 100.5 F  *CHILLS WITH OR WITHOUT FEVER  NAUSEA AND VOMITING THAT IS NOT CONTROLLED WITH YOUR NAUSEA MEDICATION  *UNUSUAL SHORTNESS OF BREATH  *UNUSUAL BRUISING OR BLEEDING  TENDERNESS IN MOUTH AND THROAT WITH OR WITHOUT PRESENCE OF ULCERS  *URINARY PROBLEMS  *BOWEL PROBLEMS  UNUSUAL RASH Items with * indicate a potential emergency and should be followed up as soon as possible.  Feel free to call the clinic should you have any questions or concerns. The clinic phone number is [phone].  Please show the CHEMO ALERT CARD at check-in to the Emergency Department and triage nurse.   

## 2020-07-01 ENCOUNTER — Telehealth: Payer: Self-pay | Admitting: *Deleted

## 2020-07-01 NOTE — Telephone Encounter (Signed)
lvm pt is to call PCP and set up appt due to pt needs to be on Eliquis and according to last ov pt is not.

## 2020-07-02 ENCOUNTER — Other Ambulatory Visit: Payer: Self-pay

## 2020-07-02 ENCOUNTER — Inpatient Hospital Stay: Payer: Medicare Other

## 2020-07-02 ENCOUNTER — Inpatient Hospital Stay (HOSPITAL_BASED_OUTPATIENT_CLINIC_OR_DEPARTMENT_OTHER): Payer: Medicare Other | Admitting: Hematology

## 2020-07-02 VITALS — BP 118/62 | HR 69 | Temp 97.6°F | Resp 18 | Ht 70.0 in | Wt 162.0 lb

## 2020-07-02 DIAGNOSIS — Z5111 Encounter for antineoplastic chemotherapy: Secondary | ICD-10-CM | POA: Diagnosis not present

## 2020-07-02 DIAGNOSIS — I259 Chronic ischemic heart disease, unspecified: Secondary | ICD-10-CM

## 2020-07-02 DIAGNOSIS — E46 Unspecified protein-calorie malnutrition: Secondary | ICD-10-CM

## 2020-07-02 DIAGNOSIS — Z7189 Other specified counseling: Secondary | ICD-10-CM

## 2020-07-02 DIAGNOSIS — C9000 Multiple myeloma not having achieved remission: Secondary | ICD-10-CM

## 2020-07-02 DIAGNOSIS — Z5112 Encounter for antineoplastic immunotherapy: Secondary | ICD-10-CM | POA: Diagnosis not present

## 2020-07-02 DIAGNOSIS — D508 Other iron deficiency anemias: Secondary | ICD-10-CM

## 2020-07-02 LAB — CBC WITH DIFFERENTIAL/PLATELET
Abs Immature Granulocytes: 0.1 10*3/uL — ABNORMAL HIGH (ref 0.00–0.07)
Basophils Absolute: 0 10*3/uL (ref 0.0–0.1)
Basophils Relative: 0 %
Eosinophils Absolute: 0.1 10*3/uL (ref 0.0–0.5)
Eosinophils Relative: 1 %
HCT: 27.1 % — ABNORMAL LOW (ref 39.0–52.0)
Hemoglobin: 8.7 g/dL — ABNORMAL LOW (ref 13.0–17.0)
Immature Granulocytes: 1 %
Lymphocytes Relative: 17 %
Lymphs Abs: 1.3 10*3/uL (ref 0.7–4.0)
MCH: 25.5 pg — ABNORMAL LOW (ref 26.0–34.0)
MCHC: 32.1 g/dL (ref 30.0–36.0)
MCV: 79.5 fL — ABNORMAL LOW (ref 80.0–100.0)
Monocytes Absolute: 0.7 10*3/uL (ref 0.1–1.0)
Monocytes Relative: 9 %
Neutro Abs: 5.4 10*3/uL (ref 1.7–7.7)
Neutrophils Relative %: 72 %
Platelets: 276 10*3/uL (ref 150–400)
RBC: 3.41 MIL/uL — ABNORMAL LOW (ref 4.22–5.81)
RDW: 17.8 % — ABNORMAL HIGH (ref 11.5–15.5)
WBC: 7.6 10*3/uL (ref 4.0–10.5)
nRBC: 0 % (ref 0.0–0.2)

## 2020-07-02 LAB — CMP (CANCER CENTER ONLY)
ALT: 9 U/L (ref 0–44)
AST: 6 U/L — ABNORMAL LOW (ref 15–41)
Albumin: 2.6 g/dL — ABNORMAL LOW (ref 3.5–5.0)
Alkaline Phosphatase: 71 U/L (ref 38–126)
Anion gap: 10 (ref 5–15)
BUN: 21 mg/dL (ref 8–23)
CO2: 16 mmol/L — ABNORMAL LOW (ref 22–32)
Calcium: 8.7 mg/dL — ABNORMAL LOW (ref 8.9–10.3)
Chloride: 112 mmol/L — ABNORMAL HIGH (ref 98–111)
Creatinine: 2.29 mg/dL — ABNORMAL HIGH (ref 0.61–1.24)
GFR, Estimated: 31 mL/min — ABNORMAL LOW (ref >60)
Glucose, Bld: 215 mg/dL — ABNORMAL HIGH (ref 70–99)
Potassium: 3.4 mmol/L — ABNORMAL LOW (ref 3.5–5.1)
Sodium: 138 mmol/L (ref 135–145)
Total Bilirubin: 0.6 mg/dL (ref 0.3–1.2)
Total Protein: 6.5 g/dL (ref 6.5–8.1)

## 2020-07-02 MED ORDER — MONTELUKAST SODIUM 10 MG PO TABS
ORAL_TABLET | ORAL | Status: AC
  Filled 2020-07-02: qty 1

## 2020-07-02 MED ORDER — ACETAMINOPHEN 325 MG PO TABS
650.0000 mg | ORAL_TABLET | Freq: Once | ORAL | Status: AC
  Administered 2020-07-02: 10:00:00 650 mg via ORAL

## 2020-07-02 MED ORDER — DIPHENHYDRAMINE HCL 25 MG PO CAPS
50.0000 mg | ORAL_CAPSULE | Freq: Once | ORAL | Status: AC
  Administered 2020-07-02: 10:00:00 50 mg via ORAL

## 2020-07-02 MED ORDER — DIPHENHYDRAMINE HCL 25 MG PO CAPS
ORAL_CAPSULE | ORAL | Status: AC
  Filled 2020-07-02: qty 2

## 2020-07-02 MED ORDER — SODIUM CHLORIDE 0.9 % IV SOLN
Freq: Once | INTRAVENOUS | Status: AC
  Filled 2020-07-02: qty 250

## 2020-07-02 MED ORDER — SODIUM CHLORIDE 0.9% FLUSH
10.0000 mL | INTRAVENOUS | Status: DC | PRN
  Filled 2020-07-02: qty 10

## 2020-07-02 MED ORDER — MEPERIDINE HCL 25 MG/ML IJ SOLN
25.0000 mg | Freq: Once | INTRAMUSCULAR | Status: AC
  Administered 2020-07-02: 10:00:00 25 mg via INTRAVENOUS

## 2020-07-02 MED ORDER — MEPERIDINE HCL 25 MG/ML IJ SOLN
INTRAMUSCULAR | Status: AC
  Filled 2020-07-02: qty 1

## 2020-07-02 MED ORDER — METHYLPREDNISOLONE SODIUM SUCC 125 MG IJ SOLR
100.0000 mg | Freq: Once | INTRAMUSCULAR | Status: AC
  Administered 2020-07-02: 10:00:00 100 mg via INTRAVENOUS

## 2020-07-02 MED ORDER — SODIUM CHLORIDE 0.9 % IV SOLN
16.0000 mg/kg | Freq: Once | INTRAVENOUS | Status: AC
  Administered 2020-07-02: 12:00:00 1300 mg via INTRAVENOUS
  Filled 2020-07-02: qty 60

## 2020-07-02 MED ORDER — SODIUM CHLORIDE 0.9 % IV SOLN
Freq: Once | INTRAVENOUS | Status: DC
  Filled 2020-07-02: qty 250

## 2020-07-02 MED ORDER — MONTELUKAST SODIUM 10 MG PO TABS
10.0000 mg | ORAL_TABLET | Freq: Once | ORAL | Status: AC
  Administered 2020-07-02: 10:00:00 10 mg via ORAL

## 2020-07-02 MED ORDER — DEXTROSE 5 % IV SOLN
56.0000 mg/m2 | Freq: Once | INTRAVENOUS | Status: AC
  Administered 2020-07-02: 110 mg via INTRAVENOUS
  Filled 2020-07-02: qty 30

## 2020-07-02 MED ORDER — SODIUM CHLORIDE 0.9 % IV SOLN
40.0000 mg | Freq: Once | INTRAVENOUS | Status: AC
  Administered 2020-07-02: 10:00:00 40 mg via INTRAVENOUS
  Filled 2020-07-02: qty 4

## 2020-07-02 MED ORDER — ACETAMINOPHEN 325 MG PO TABS
ORAL_TABLET | ORAL | Status: AC
  Filled 2020-07-02: qty 2

## 2020-07-02 MED ORDER — APIXABAN 2.5 MG PO TABS: 2.5000 mg | ORAL_TABLET | Freq: Two times a day (BID) | ORAL | 2 refills | Status: DC

## 2020-07-02 MED ORDER — SODIUM CHLORIDE 0.9 % IV SOLN
200.0000 mg | Freq: Once | INTRAVENOUS | Status: AC
  Administered 2020-07-02: 10:00:00 200 mg via INTRAVENOUS
  Filled 2020-07-02: qty 200

## 2020-07-02 MED ORDER — HEPARIN SOD (PORK) LOCK FLUSH 100 UNIT/ML IV SOLN
500.0000 [IU] | Freq: Once | INTRAVENOUS | Status: DC | PRN
  Filled 2020-07-02: qty 5

## 2020-07-02 MED ORDER — METHYLPREDNISOLONE SODIUM SUCC 125 MG IJ SOLR
INTRAMUSCULAR | Status: AC
  Filled 2020-07-02: qty 2

## 2020-07-02 NOTE — Patient Instructions (Signed)
[place] Discharge Instructions for Patients Receiving Chemotherapy  Today you received the following chemotherapy agents:kyprolis, darzalex  To help prevent nausea and vomiting after your treatment, we encourage you to take your nausea medication as directed.    If you develop nausea and vomiting that is not controlled by your nausea medication, call the clinic.   BELOW ARE SYMPTOMS THAT SHOULD BE REPORTED IMMEDIATELY:  *FEVER GREATER THAN 100.5 F  *CHILLS WITH OR WITHOUT FEVER  NAUSEA AND VOMITING THAT IS NOT CONTROLLED WITH YOUR NAUSEA MEDICATION  *UNUSUAL SHORTNESS OF BREATH  *UNUSUAL BRUISING OR BLEEDING  TENDERNESS IN MOUTH AND THROAT WITH OR WITHOUT PRESENCE OF ULCERS  *URINARY PROBLEMS  *BOWEL PROBLEMS  UNUSUAL RASH Items with * indicate a potential emergency and should be followed up as soon as possible.  Feel free to call the clinic should you have any questions or concerns. The clinic phone number is [phone].  Please show the [place] at check-in to the Emergency Department and triage nurse.

## 2020-07-02 NOTE — Progress Notes (Signed)
Per Dr. Irene Limbo okay to proceed with treatment today with creatine 2.29.

## 2020-07-02 NOTE — Progress Notes (Signed)
Patient ok with Benadryl 50 mg today (received 25mg  last week).  Hardie Pulley, PharmD, BCPS, BCOP

## 2020-07-02 NOTE — Progress Notes (Signed)
HEMATOLOGY/ONCOLOGY CLINIC NOTE  Date of Service: 07/02/2020  Patient Care Team: Isaac Bliss, Rayford Halsted, MD as PCP - General (Internal Medicine) Martinique, Peter M, MD as PCP - Cardiology (Cardiology) Isaac Bliss, Rayford Halsted, MD (Internal Medicine)  CHIEF COMPLAINTS/PURPOSE OF CONSULTATION:  Continued mx of myeloma  HISTORY OF PRESENTING ILLNESS:  [name] is a wonderful 64 y.o. male who has been referred to [place] by Dr Servando Snare for evaluation and management of elevated protein. Pt is accompanied today by his wife, Mrs. Hollingshed. The pt reports that he is doing well overall.    The pt reports he was first told that he had Diabetes two years ago. He has not previously needed to be on medications for his diabetes but has recently been started on Januvia by Dr. Elayne Snare, his Endocrinologist. His blood glucose was 149 this morning. He sees Burtis Junes - NP and Dr. Martinique for Cardiology. Pt has CAD and had to have open heart surgery 20 years ago. He also has HTN that he feels has been stable. His Cardiology team is currently managing his heart medications and recently told pt to discontinue taking Furosemide due kidney function on last labs. Dr. Martinique and Truitt Merle have been essentially functioning as his primary care, as he does not have a PCP at this time. He has an upcoming appointment with Truitt Merle in the beginning of May and Dr. [name] next Tuesday. Pt is currently using Ibuprofen a couple of times per week for his knee pain.   Pt has felt the same in the last 3-6 months and denies any new bone pain of any kinds. He is eating, functioning, and moving about as normal. He has lost about 13 lbs over the last few months. Pt does not smoke and does not drink much alcohol.    Most recent lab results (09/04/2019) of CBC, BMP and Hepatic function panel is as follows: all values are WNL except for RBC at 3.64, Hgb at 9.8, HCT at 29.8, Glucose at 129, BUN at 64, Creatinine  at 2.62, GFR Est Af Am at 29, CO2 at 15, Total Protein at 10.0, Albumin at 3.6, ALP at 163, ALT at 45.    On review of systems, pt reports unexpected weight loss, left knee pain and denies new back pain, new shoulder pain, new hip pain, chest pain, SOB, leg swelling, testicular pain/swelling and any other symptoms.    On PMHx the pt reports Left Knee Arthritis, CAD, CHF, Type II Diabetes, HTN, HLD, Cardiac Catheterization, Coronary Artery Bypass Graft x4 (2002). On Social Hx the pt reports that he is a non-smoker and does not drink much alcohol.  INTERVAL HISTORY:   [name] is a wonderful 64 y.o. male is here for evaluation and management of Multiple Myeloma. The patient's last visit with [place] was on 06/12/2020. The pt reports that he is doing well overall.  The pt reports that he has been more fatigued over the last week. Pt has also experienced a drop in appetite after his second treatment with Carflizomib. He believes that his blood glucose levels are good at home, except for directly around treatment. He admits that he is not hydrating properly.  Pt was taking Eliquis while in the hospital but did not fill the prescription when he left. The pt has not been taking Eliquis since his hospital discharge in September, despite our previous conversations and recommendations.  Lab results today (07/02/20) of CBC w/diff and CMP is  as follows: all values are WNL except for RBC at 3.41, Hgb at 8.7, HCT at 27.1, MCV at 79.5, MCH at 25.5, RDW at 17.8, Abs Immature Granulocytes at 0.10K, Potassium at 3.4, Chloride at 112, CO2 at 16, Glucose at 215, Creatinine at 2.29, Calcium at 8.7, Albumin at 2.6, AST at 6, GFR Est at 31.  On review of systems, pt reports fatigue, low appetite and denies new bone pain, fevers, chills, leg swelling, chest pain, SOB and any other symptoms.   MEDICAL HISTORY:  Past Medical History:  Diagnosis Date   Anemia    Arthritis    "left knee" (11/24/2017)   CAD  (coronary artery disease)    CABG 2002   Cancer [facility])    Multiple Myeloma   CHF (congestive heart failure) (HCC)    Chronic kidney disease    Chronic renal insufficiency    Diabetes mellitus without complication ([place])    Dilated cardiomyopathy ([place])    echo in 2009 showed improvement with a normal EF   HTN (hypertension)    Hyperlipemia    Hypertension    Noncompliance    PAD (peripheral artery disease) ([place])    Pneumonia     SURGICAL HISTORY: Past Surgical History:  Procedure Laterality Date   CARDIAC CATHETERIZATION  2002, 2006   CORONARY ARTERY BYPASS GRAFT  2002   x4 DR. VAN TRIGT   IR FLUORO GUIDED NEEDLE PLC ASPIRATION/INJECTION LOC  10/24/2019    SOCIAL HISTORY: Social History   Socioeconomic History   Marital status: Married    Spouse name: Not on file   Number of children: 1   Years of education: Not on file   Highest education level: Not on file  Occupational History   Occupation: Furniture conservator/restorer  Tobacco Use   Smoking status: Never Smoker   Smokeless tobacco: Never Used  Scientific laboratory technician Use: Never used  Substance and Sexual Activity   Alcohol use: Not Currently   Drug use: Not Currently   Sexual activity: Yes  Other Topics Concern   Not on file  Social History Narrative   ** Merged History Encounter **       Social Determinants of Health   Financial Resource Strain: Not on file  Food Insecurity: Not on file  Transportation Needs: Not on file  Physical Activity: Not on file  Stress: Not on file  Social Connections: Not on file  Intimate Partner Violence: Not on file    FAMILY HISTORY: Family History  Problem Relation Age of Onset   Hypertension Father    Diabetes Mother        DIABETIC COMA    ALLERGIES:  has No Known Allergies.  MEDICATIONS:  Current Outpatient Medications  Medication Sig Dispense Refill   Accu-Chek Softclix Lancets lancets Use Accu Chek softclix lancets to check blood sugar fasting or  2 hours after a meal every other day. 100 each 2   acyclovir (ZOVIRAX) 400 MG tablet Take 0.5 tablets (200 mg total) by mouth 2 (two) times daily. 60 tablet 11   amLODipine (NORVASC) 5 MG tablet Take 5 mg by mouth daily.     apixaban (ELIQUIS) 2.5 MG TABS tablet Take 1 tablet (2.5 mg total) by mouth 2 (two) times daily. 60 tablet 2   atorvastatin (LIPITOR) 80 MG tablet Take 1 tablet by mouth once daily 90 tablet 3   Blood Glucose Monitoring Suppl (ACCU-CHEK GUIDE ME) w/Device KIT 1 each by Does not apply route.  carvedilol (COREG) 25 MG tablet Take 25 mg by mouth 2 (two) times daily with a meal.     dexamethasone (DECADRON) 4 MG tablet Take 12 mg by mouth See admin instructions. Take after chemo treatments     glucose blood (ACCU-CHEK GUIDE) test strip Use Accu Chek test strips as instructed to check blood sugar fasting or two hours after meals, every other day. 100 each 2   hydrALAZINE (APRESOLINE) 50 MG tablet TAKE 1 TABLET BY MOUTH THREE TIMES DAILY 270 tablet 3   isosorbide mononitrate (IMDUR) 30 MG 24 hr tablet Take 1 tablet (30 mg total) by mouth daily. 90 tablet 2   losartan (COZAAR) 100 MG tablet Take 100 mg by mouth daily.     metFORMIN (GLUCOPHAGE-XR) 500 MG 24 hr tablet Take 500 mg by mouth 2 (two) times daily.     ondansetron (ZOFRAN) 8 MG tablet Take 8 mg by mouth every 8 (eight) hours as needed for nausea or vomiting.     potassium chloride SA (KLOR-CON) 20 MEQ tablet Take 1 tablet by mouth once daily 30 tablet 3   predniSONE (DELTASONE) 20 MG tablet Take 1 tablet (20 mg total) by mouth daily as needed (arthiritis). 30 tablet 3   prochlorperazine (COMPAZINE) 10 MG tablet Take 1 tablet (10 mg total) by mouth every 6 (six) hours as needed (Nausea or vomiting). 30 tablet 1   repaglinide (PRANDIN) 1 MG tablet Take 1 tablet (1 mg total) by mouth 3 (three) times daily before meals. 90 tablet 1   No current facility-administered medications for this visit.    Facility-Administered Medications Ordered in Other Visits  Medication Dose Route Frequency Provider Last Rate Last Admin   0.9 %  sodium chloride infusion   Intravenous Once Brunetta Genera, MD       heparin lock flush 100 unit/mL  500 Units Intracatheter Once PRN Brunetta Genera, MD       sodium chloride flush (NS) 0.9 % injection 10 mL  10 mL Intravenous PRN Brunetta Genera, MD       sodium chloride flush (NS) 0.9 % injection 10 mL  10 mL Intracatheter PRN Brunetta Genera, MD        REVIEW OF SYSTEMS:  A 10+ POINT REVIEW OF SYSTEMS WAS OBTAINED including neurology, dermatology, psychiatry, cardiac, respiratory, lymph, extremities, GI, GU, Musculoskeletal, constitutional, breasts, reproductive, HEENT.  All pertinent positives are noted in the HPI.  All others are negative.   PHYSICAL EXAMINATION: ECOG PERFORMANCE STATUS: 0 - Asymptomatic  VS reviewed - stable  Exam was given in a chair   GENERAL:alert, in no acute distress and comfortable SKIN: no acute rashes, no significant lesions EYES: conjunctiva are pink and non-injected, sclera anicteric OROPHARYNX: MMM, no exudates, no oropharyngeal erythema or ulceration NECK: supple, no JVD LYMPH:  no palpable lymphadenopathy in the cervical, axillary or inguinal regions LUNGS: clear to auscultation b/l with normal respiratory effort HEART: regular rate & rhythm ABDOMEN:  normoactive bowel sounds , non tender, not distended. No palpable hepatosplenomegaly.  Extremity: no pedal edema PSYCH: alert & oriented x 3 with fluent speech NEURO: no focal motor/sensory deficits  LABORATORY DATA:  I have reviewed the data as listed  . CBC Latest Ref Rng & Units 07/02/2020 06/25/2020 06/17/2020  WBC 4.0 - 10.5 K/uL 7.6 5.6 5.9  Hemoglobin 13.0 - 17.0 g/dL 8.7(L) 8.5(L) 8.4(L)  Hematocrit 39.0 - 52.0 % 27.1(L) 26.7(L) 26.3(L)  Platelets 150 - 400 K/uL 276 304 287    .  CMP Latest Ref Rng & Units 07/02/2020 06/25/2020  06/17/2020  Glucose 70 - 99 mg/dL 215(H) 187(H) 130(H)  BUN 8 - 23 mg/dL _0 Creatinine 0.61 - 1.24 mg/dL 2.29(H) 2.29(H) 2.04(H)  Sodium 135 - 145 mmol/L 138 138 141  Potassium 3.5 - 5.1 mmol/L 3.4(L) 3.3(L) 4.1  Chloride 98 - 111 mmol/L 112(H) 112(H) 109  CO2 22 - 32 mmol/L 16(L) 19(L) 25  Calcium 8.9 - 10.3 mg/dL 8.7(L) 8.9 9.1  Total Protein 6.5 - 8.1 g/dL 6.5 6.9 7.0  Total Bilirubin 0.3 - 1.2 mg/dL 0.6 0.5 0.6  Alkaline Phos 38 - 126 U/L 71 75 74  AST 15 - 41 U/L 6(L) 6(L) 7(L)  ALT 0 - 44 U/L 9 7 <6   10/24/2019 FISH Panel:    10/24/2019 [facility] Surgical Pathology:    RADIOGRAPHIC STUDIES: I have personally reviewed the radiological images as listed and agreed with the findings in the report. No results found.  ASSESSMENT & PLAN:    64 yo with   1)  Newly diagnosed Multiple myeloma -09/27/19 M Protein at 3.4 -10/25/2019 PET/CT ([id number]) revealed "1. No specific myelomatous lesions are identified. 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  2) Anemia ? Related to CKD vs ? Plasma cell dyscrasia 3) CKD ?etiology - HTN ? Cardiac issues ? Myeloma related. 4) recently diagnosed small Pulmonary embolism  PLAN: -Discussed pt labwork today, 07/02/20; Hgb is steady, PLT & WBC are nml, Glucose is elevated, other blood chemistries are steady. -The pt has no prohibitive toxicities from continuing  C9D15 Daratumumab/Carfilzomib/Dexamethasone at this time. -Advised pt that continuing treatment will be dependant upon his functional status.  -Recommended that the pt work on eat well, drinking at least 48-64 oz of water each day, and getting 20-30 minutes of activity each day.  -Advised pt that he had a small PE on 03/13/2020. Advised pt that taking Eliquis is important for resolving his current blood clot and preventing future blood clots.  -Recommend pt begin Eliquis immediately and discontinue Aspirin.  -Recommend pt f/u with PCP, Dr. Isaac Bliss, in 1-2 weeks. He needs to discuss DM management, as well as Metformin and Losartan dosage considering his impaired renal function. -Pt has been contacted by [facility] to schedule an appointment with Dr. Aris Lot multiple times. Recommend he contact to schedule an appointment ASAP.  -Will refer pt to our [place] -Will see back with C10D1 -Rx renally adjusted dose of Eliquis    FOLLOW UP: -patient to call and setup f/u with PCP ASAP to optimize mx of other co-morbids HTN, DM2 -patient has missed 3 calls from [facility] - he needs to call [phone]) or [phone] (toll-free) to f/u about his appointment setup with Dr Aris Lot -plz schedule next cycle of Daratumumab/Carfilzomib as requested from 07/16/2020 D1,D8 and D15. -Portflush and labs on D1,D8 and D15 -MD visit on D1 of next cycle on 07/16/2020 -nutritional referral ASAP for poor po intake   The total time  spent in the appt was 30 minutes and more than 50% was on counseling and direct patient cares.  All of the patient's questions were answered with apparent satisfaction. The patient knows to call the clinic with any problems, questions or concerns.    Sullivan Lone MD [place] AAHIVMS [facility] Hematology/Oncology Physician [facility]  (Office):       [phone] (Work cell):  [phone] (Fax):           [phone]  07/02/2020 12:29 PM  I,  Yevette Edwards, am acting as a scribe for Dr. Sullivan Lone.   .I have reviewed the above documentation for accuracy and completeness, and I agree with the above. Brunetta Genera MD

## 2020-07-03 ENCOUNTER — Inpatient Hospital Stay: Payer: Medicare Other

## 2020-07-03 ENCOUNTER — Telehealth: Payer: Self-pay | Admitting: Dietician

## 2020-07-03 NOTE — Telephone Encounter (Signed)
  Nutrition Assessment   Reason for Assessment: MST   ASSESSMENT:  Nutrition assessment completed via phone   64 year old male with multiple myeloma and is followed by Dr. Irene Limbo.   Past medical history of CAD s/p CABG x 4, dilated cardiomyopathy, HTN, PAD, CKD, chronic combined CHF, pulmonary embolism, HTN, DM2, RA, and iron deficiency anemia.   Reports poor appetite and says food has no taste since adding new chemotherapy last month. He has eaten a sausage biscuit with an orange juice from [employer] today. Normally eats a late breakfast, then eats dinner, recalls eating a bowl of chili beans last night. He denies nausea, vomiting, diarrhea, constipation.   Nutrition Focused Physical Exam: Deferred   Medications: Acyclovir, Decadron, Imdur, Metformin, Klor-con, Prednisone, Compazine,    Labs: K 3.4 (L), Blood glucose 215 (H), Cr 2.29 (H), Hgb 8.7 (L), HCT 27.1 (L) on 12/21 A1c 8.1 in 09/21   Anthropometrics:   Height: 5'10" Weight: 162 lb (73.5 kg) UBW: pt unsure, states they go up and down (166-189 lbs since 01/21, suspect due to fluid shifts given chronic combined CHF) BMI: 23.24  12/21- 162 lb 12/01- 168 lb 8 oz 11/26- 171 lb 8 oz 10/12- 173 lb 6.4 oz  9/06- 189 lb 13.1oz  Weights have trended down ~28 lbs (14.6%) in the last 3.5 months which is significant  Estimated Energy Needs  Kcals: 2000-2200 Protein: 110-120 Fluid: >2 L/day   NUTRITION DIAGNOSIS: Inadequate oral intake related to decreased appetite as evidenced by 14.6% weight loss in 3.5 months   INTERVENTION:  Discussed strategies for increasing calories/protein with current intake Encouraged small frequent meals/snacks throughout the day  Provided suggestions for improving taste of foods Will mail fact sheets  Patient willing to try nutrition supplement, will mail coupons  Provided RD contact information   MONITORING, EVALUATION, GOAL: weights, po intake   Next Visit: To be scheduled    Lajuan Lines, RD, LDN Clinical Nutrition After Hours/Weekend Pager # in [place]

## 2020-07-03 NOTE — Progress Notes (Signed)
Nutrition  See phone note.  [name], [place], [place] Registered Dietitian [phone] (mobile)

## 2020-07-10 NOTE — Progress Notes (Signed)
..  The following Assist/Replace Program for Kyprolis from [place] has been terminated due to Medicare Plan starting 06/12/2020.  Last DOS:07/02/2020

## 2020-07-14 NOTE — Progress Notes (Signed)
Office Visit Note  Patient: [name]             Date of Birth: 04-26-1956           MRN: [id number]             PCP: Isaac Bliss, Rayford Halsted, MD Referring: Isaac Bliss, [place]* Visit Date: 07/19/2020 Occupation: @GUAROCC @  Subjective:  Possible rheumatoid arthritis.   History of Present Illness: [name] is a 65 y.o. male seen in consultation per request of his PCP for evaluation of possible rheumatoid arthritis.  According the patient he has had arthritis for many years.  He also developed nodules over his elbows for many years.  He states in July he started having pain and swelling in his right hand at the time he had x-ray of his right hand and was given prednisone taper.  He states although he took prednisone the symptoms resolved and he had no recurrence of the symptoms.  He has been off prednisone.  He states he also had sepsis in September and he was hospitalized.  He currently has no discomfort in his joints.  He states he would prefer not to take any medications as he has no discomfort.  Activities of Daily Living:  Patient reports morning stiffness for 0 minutes.   Patient Denies nocturnal pain.  Difficulty dressing/grooming: Denies Difficulty climbing stairs: Denies Difficulty getting out of chair: Reports Difficulty using hands for taps, buttons, cutlery, and/or writing: Denies  Review of Systems  Constitutional: Negative for fatigue.  HENT: Negative for mouth sores, mouth dryness and nose dryness.   Eyes: Negative for pain, itching and dryness.  Respiratory: Negative for shortness of breath and difficulty breathing.   Cardiovascular: Negative for chest pain and palpitations.  Gastrointestinal: Negative for blood in stool, constipation and diarrhea.  Endocrine: Negative for increased urination.  Genitourinary: Negative for difficulty urinating.  Musculoskeletal: Negative for arthralgias, joint pain, joint swelling, myalgias, morning  stiffness, muscle tenderness and myalgias.  Skin: Negative for color change, rash and redness.  Allergic/Immunologic: Negative for susceptible to infections.  Neurological: Negative for dizziness, numbness, headaches, memory loss and weakness.  Hematological: Negative for bruising/bleeding tendency.  Psychiatric/Behavioral: Negative for confusion.    PMFS History:  Patient Active Problem List   Diagnosis Date Noted  . Flu vaccine need 06/04/2020  . Iron deficiency anemia 06/04/2020  . Rheumatoid arthritis involving multiple sites ([place]) 04/17/2020  . Aspiration pneumonia ([place]) 03/13/2020  . Severe sepsis ([place]) 03/13/2020  . Pulmonary embolism ([place]) 03/13/2020  . AKI (acute kidney injury) ([place]) 03/13/2020  . Anemia 03/13/2020  . Multiple myeloma ([place]) 03/13/2020  . Hypertension 03/13/2020  . Acute hypoxemic respiratory failure ([place]) 03/13/2020  . Multiple myeloma ([place]) 11/06/2019  . Counseling regarding advance care planning and goals of care 11/06/2019  . Uncontrolled type 2 diabetes mellitus with hyperglycemia ([place]) 09/19/2018  . Acute on chronic combined systolic and diastolic CHF (congestive heart failure) ([place]) 11/26/2017  . Hyperlipemia 01/07/2011  . CAD (coronary artery disease)   . Dilated cardiomyopathy ([place])   . HTN (hypertension)   . PAD (peripheral artery disease) ([place])   . Chronic renal insufficiency     Past Medical History:  Diagnosis Date  . Anemia   . Arthritis    "left knee" (11/24/2017)  . CAD (coronary artery disease)    CABG 2002  . Cancer [facility])    Multiple Myeloma  . CHF (congestive heart failure) ([place])   . Chronic kidney disease   .  Chronic renal insufficiency   . Diabetes mellitus without complication ([place])   . Dilated cardiomyopathy ([place])    echo in 2009 showed improvement with a normal EF  . HTN (hypertension)   . Hyperlipemia   . Hypertension   . Noncompliance   . PAD (peripheral artery disease) ([place])   . Pneumonia     Family History   Problem Relation Age of Onset  . Hypertension Father   . Diabetes Mother        DIABETIC COMA  . Healthy Daughter    Past Surgical History:  Procedure Laterality Date  . CARDIAC CATHETERIZATION  2002, 2006  . CORONARY ARTERY BYPASS GRAFT  2002   x4 DR. VAN TRIGT  . IR FLUORO GUIDED NEEDLE PLC ASPIRATION/INJECTION LOC  10/24/2019   Social History   Social History Narrative   ** Merged History Encounter **       Immunization History  Administered Date(s) Administered  . Influenza,inj,Quad PF,6+ Mos 06/04/2020  . PFIZER SARS-COV-2 Vaccination 10/19/2019, 11/13/2019, 04/30/2020  . Tdap 10/04/2019     Objective: Vital Signs: BP 95/66 (BP Location: Right Arm, Patient Position: Sitting, Cuff Size: Normal)   Pulse 68   Resp 16   Ht $R'5\' 9"'bc$  (1.753 m)   Wt 158 lb 3.2 oz (71.8 kg)   BMI 23.36 kg/m    Physical Exam Vitals and nursing note reviewed.  Constitutional:      Appearance: He is well-developed and well-nourished.  HENT:     Head: Normocephalic and atraumatic.  Eyes:     Extraocular Movements: EOM normal.     Conjunctiva/sclera: Conjunctivae normal.     Pupils: Pupils are equal, round, and reactive to light.  Cardiovascular:     Rate and Rhythm: Normal rate and regular rhythm.     Heart sounds: Normal heart sounds.  Pulmonary:     Effort: Pulmonary effort is normal.     Breath sounds: Normal breath sounds.  Abdominal:     General: Bowel sounds are normal.     Palpations: Abdomen is soft.  Musculoskeletal:     Cervical back: Normal range of motion and neck supple.  Skin:    General: Skin is warm and dry.     Capillary Refill: Capillary refill takes less than 2 seconds.     Comments: He had bilateral elbow joint nodulosis.  Neurological:     Mental Status: He is alert and oriented to person, place, and time.  Psychiatric:        Mood and Affect: Mood and affect normal.        Behavior: Behavior normal.      Musculoskeletal Exam: He had limited range of  motion of cervical spine.  Shoulder joints with good range of motion.  Elbow joints with good range of motion.  He had limited range of motion of bilateral wrist joints.  He has synovial thickening over right second MCP joint.  No synovitis over MCPs or PIPs or DIPs was noted.  He has incomplete fist formation.  Hip joints and knee joints in good range of motion.  He had no tenderness over ankles or MTPs.  CDAI Exam: CDAI Score: -- Patient Global: --; Provider Global: -- Swollen: --; Tender: -- Joint Exam 07/19/2020   No joint exam has been documented for this visit   There is currently no information documented on the homunculus. Go to the Rheumatology activity and complete the homunculus joint exam.  Investigation: No additional findings.  Imaging: XR Hand 2  View Left  Result Date: 07/19/2020 Juxta-articular osteopenia was noted.  Narrowing of intercarpal, first second and third MCP joints was noted.  Cystic changes were noted in the MCPs and carpal bones.  Severe PIP and DIP narrowing was noted. Impression: These findings are consistent with inflammatory arthritis of the hand most likely rheumatoid arthritis.   Recent Labs: Lab Results  Component Value Date   WBC 6.0 07/15/2020   HGB 8.4 (L) 07/15/2020   PLT 403 (H) 07/15/2020   NA 138 07/15/2020   K 3.8 07/15/2020   CL 109 07/15/2020   CO2 20 (L) 07/15/2020   GLUCOSE 183 (H) 07/15/2020   BUN 18 07/15/2020   CREATININE 2.12 (H) 07/15/2020   BILITOT 0.5 07/15/2020   ALKPHOS 73 07/15/2020   AST <6 (L) 07/15/2020   ALT 6 07/15/2020   PROT 6.7 07/15/2020   ALBUMIN 2.7 (L) 07/15/2020   CALCIUM 9.2 07/15/2020   GFRAA 58 (L) 04/23/2020    Speciality Comments: No specialty comments available.  Procedures:  No procedures performed Allergies: Patient has no known allergies.   Assessment / Plan:     Visit Diagnoses: Inflammatory arthritis - 02/02/20: ANA negative, CRP 114.2, CCP<16, RF 22, uric acid 5.1.  Clinical and  radiographic findings are consistent with rheumatoid arthritis.  He has severe erosive disease.  He has no synovitis on examination today.  He states he had only 1 episode of swelling in his right hand which resolved.  He also has multiple nodules over bilateral elbows.  I detailed discussion regarding rheumatoid arthritis.  Different treatment options and their side effects were discussed.  He does not want to go on an immunosuppressive agent.  His arthritis appears to be burned-out.  Pain in both hands - Plan: XR Hand 2 View Left he has severe erosive inflammatory arthritis involving MCPs and carpals consistent with rheumatoid arthritis.  X-ray findings were discussed with the patient.  Encounter for antineoplastic immunotherapy  Multiple myeloma, remission status unspecified (HCC)-he has been recently diagnosed with multiple myeloma.  Patient states he will be starting treatment soon and does not want to be on any additional medications.  Anemia, unspecified type-secondary to multiple myeloma.  Single subsegmental pulmonary embolism without acute cor pulmonale (HCC)  PAD (peripheral artery disease) ([place])  Primary hypertension  Dilated cardiomyopathy (HCC)  Coronary artery disease involving native coronary artery of native heart without angina pectoris  Acute hypoxemic respiratory failure ([place])  Uncontrolled type 2 diabetes mellitus with hyperglycemia (HCC)  Chronic renal impairment, stage 3a (HCC)  Severe sepsis ([place]) - 03/2020  History of hyperlipidemia  Orders: Orders Placed This Encounter  Procedures  . XR Hand 2 View Left   No orders of the defined types were placed in this encounter.    Follow-Up Instructions: Return if symptoms worsen or fail to improve, for Rheumatoid arthritis.   Bo Merino, MD  Note - This record has been created using Editor, commissioning.  Chart creation errors have been sought, but may not always  have been located. Such creation errors  do not reflect on  the standard of medical care.

## 2020-07-15 ENCOUNTER — Inpatient Hospital Stay (HOSPITAL_BASED_OUTPATIENT_CLINIC_OR_DEPARTMENT_OTHER): Payer: Medicare Other | Admitting: Hematology

## 2020-07-15 ENCOUNTER — Inpatient Hospital Stay: Payer: Medicare Other

## 2020-07-15 ENCOUNTER — Other Ambulatory Visit: Payer: Self-pay

## 2020-07-15 ENCOUNTER — Inpatient Hospital Stay: Payer: Medicare Other | Attending: Hematology

## 2020-07-15 VITALS — BP 124/65 | HR 81 | Temp 98.0°F | Resp 18

## 2020-07-15 DIAGNOSIS — Z7984 Long term (current) use of oral hypoglycemic drugs: Secondary | ICD-10-CM | POA: Insufficient documentation

## 2020-07-15 DIAGNOSIS — I2693 Single subsegmental pulmonary embolism without acute cor pulmonale: Secondary | ICD-10-CM

## 2020-07-15 DIAGNOSIS — I2510 Atherosclerotic heart disease of native coronary artery without angina pectoris: Secondary | ICD-10-CM | POA: Insufficient documentation

## 2020-07-15 DIAGNOSIS — N189 Chronic kidney disease, unspecified: Secondary | ICD-10-CM | POA: Insufficient documentation

## 2020-07-15 DIAGNOSIS — K5900 Constipation, unspecified: Secondary | ICD-10-CM

## 2020-07-15 DIAGNOSIS — Z79899 Other long term (current) drug therapy: Secondary | ICD-10-CM | POA: Insufficient documentation

## 2020-07-15 DIAGNOSIS — C9000 Multiple myeloma not having achieved remission: Secondary | ICD-10-CM

## 2020-07-15 DIAGNOSIS — E1122 Type 2 diabetes mellitus with diabetic chronic kidney disease: Secondary | ICD-10-CM | POA: Diagnosis not present

## 2020-07-15 DIAGNOSIS — Z5112 Encounter for antineoplastic immunotherapy: Secondary | ICD-10-CM | POA: Diagnosis present

## 2020-07-15 DIAGNOSIS — Z951 Presence of aortocoronary bypass graft: Secondary | ICD-10-CM | POA: Diagnosis not present

## 2020-07-15 DIAGNOSIS — R634 Abnormal weight loss: Secondary | ICD-10-CM | POA: Insufficient documentation

## 2020-07-15 DIAGNOSIS — M25562 Pain in left knee: Secondary | ICD-10-CM | POA: Insufficient documentation

## 2020-07-15 DIAGNOSIS — Z5111 Encounter for antineoplastic chemotherapy: Secondary | ICD-10-CM | POA: Insufficient documentation

## 2020-07-15 DIAGNOSIS — Z7189 Other specified counseling: Secondary | ICD-10-CM

## 2020-07-15 DIAGNOSIS — I130 Hypertensive heart and chronic kidney disease with heart failure and stage 1 through stage 4 chronic kidney disease, or unspecified chronic kidney disease: Secondary | ICD-10-CM | POA: Insufficient documentation

## 2020-07-15 LAB — CBC WITH DIFFERENTIAL/PLATELET
Abs Immature Granulocytes: 0.05 10*3/uL (ref 0.00–0.07)
Basophils Absolute: 0 10*3/uL (ref 0.0–0.1)
Basophils Relative: 1 %
Eosinophils Absolute: 0.1 10*3/uL (ref 0.0–0.5)
Eosinophils Relative: 2 %
HCT: 26 % — ABNORMAL LOW (ref 39.0–52.0)
Hemoglobin: 8.4 g/dL — ABNORMAL LOW (ref 13.0–17.0)
Immature Granulocytes: 1 %
Lymphocytes Relative: 21 %
Lymphs Abs: 1.3 10*3/uL (ref 0.7–4.0)
MCH: 25.5 pg — ABNORMAL LOW (ref 26.0–34.0)
MCHC: 32.3 g/dL (ref 30.0–36.0)
MCV: 78.8 fL — ABNORMAL LOW (ref 80.0–100.0)
Monocytes Absolute: 0.6 10*3/uL (ref 0.1–1.0)
Monocytes Relative: 10 %
Neutro Abs: 4 10*3/uL (ref 1.7–7.7)
Neutrophils Relative %: 65 %
Platelets: 403 10*3/uL — ABNORMAL HIGH (ref 150–400)
RBC: 3.3 MIL/uL — ABNORMAL LOW (ref 4.22–5.81)
RDW: 17.7 % — ABNORMAL HIGH (ref 11.5–15.5)
WBC: 6 10*3/uL (ref 4.0–10.5)
nRBC: 0 % (ref 0.0–0.2)

## 2020-07-15 LAB — CMP (CANCER CENTER ONLY)
ALT: 6 U/L (ref 0–44)
AST: <6 U/L — ABNORMAL LOW (ref 15–41)
Albumin: 2.7 g/dL — ABNORMAL LOW (ref 3.5–5.0)
Alkaline Phosphatase: 73 U/L (ref 38–126)
Anion gap: 9 (ref 5–15)
BUN: 18 mg/dL (ref 8–23)
CO2: 20 mmol/L — ABNORMAL LOW (ref 22–32)
Calcium: 9.2 mg/dL (ref 8.9–10.3)
Chloride: 109 mmol/L (ref 98–111)
Creatinine: 2.12 mg/dL — ABNORMAL HIGH (ref 0.61–1.24)
GFR, Estimated: 34 mL/min — ABNORMAL LOW (ref >60)
Glucose, Bld: 183 mg/dL — ABNORMAL HIGH (ref 70–99)
Potassium: 3.8 mmol/L (ref 3.5–5.1)
Sodium: 138 mmol/L (ref 135–145)
Total Bilirubin: 0.5 mg/dL (ref 0.3–1.2)
Total Protein: 6.7 g/dL (ref 6.5–8.1)

## 2020-07-15 MED ORDER — METHYLPREDNISOLONE SODIUM SUCC 125 MG IJ SOLR
INTRAMUSCULAR | Status: AC
  Filled 2020-07-15: qty 2

## 2020-07-15 MED ORDER — MONTELUKAST SODIUM 10 MG PO TABS
ORAL_TABLET | ORAL | Status: AC
  Filled 2020-07-15: qty 1

## 2020-07-15 MED ORDER — DARATUMUMAB CHEMO INJECTION 400 MG/20ML
16.0000 mg/kg | Freq: Once | INTRAVENOUS | Status: AC
  Administered 2020-07-15: 1300 mg via INTRAVENOUS
  Filled 2020-07-15: qty 60

## 2020-07-15 MED ORDER — DIPHENHYDRAMINE HCL 25 MG PO CAPS
ORAL_CAPSULE | ORAL | Status: AC
  Filled 2020-07-15: qty 2

## 2020-07-15 MED ORDER — DEXTROSE 5 % IV SOLN
36.0000 mg/m2 | Freq: Once | INTRAVENOUS | Status: AC
  Administered 2020-07-15: 70 mg via INTRAVENOUS
  Filled 2020-07-15: qty 30

## 2020-07-15 MED ORDER — SODIUM CHLORIDE 0.9% FLUSH
10.0000 mL | Freq: Once | INTRAVENOUS | Status: AC | PRN
  Filled 2020-07-15: qty 10

## 2020-07-15 MED ORDER — SODIUM CHLORIDE 0.9 % IV SOLN
Freq: Once | INTRAVENOUS | Status: AC
  Filled 2020-07-15: qty 250

## 2020-07-15 MED ORDER — MONTELUKAST SODIUM 10 MG PO TABS
10.0000 mg | ORAL_TABLET | Freq: Once | ORAL | Status: AC
  Administered 2020-07-15: 10 mg via ORAL

## 2020-07-15 MED ORDER — ACETAMINOPHEN 325 MG PO TABS
ORAL_TABLET | ORAL | Status: AC
  Filled 2020-07-15: qty 2

## 2020-07-15 MED ORDER — MEPERIDINE HCL 25 MG/ML IJ SOLN
25.0000 mg | Freq: Once | INTRAMUSCULAR | Status: AC
  Administered 2020-07-15: 25 mg via INTRAVENOUS

## 2020-07-15 MED ORDER — SENNOSIDES-DOCUSATE SODIUM 8.6-50 MG PO TABS: 2.0000 | ORAL_TABLET | Freq: Every day | ORAL | 5 refills | Status: DC

## 2020-07-15 MED ORDER — ACETAMINOPHEN 325 MG PO TABS
650.0000 mg | ORAL_TABLET | Freq: Once | ORAL | Status: AC
  Administered 2020-07-15: 650 mg via ORAL

## 2020-07-15 MED ORDER — MEPERIDINE HCL 25 MG/ML IJ SOLN
INTRAMUSCULAR | Status: AC
  Filled 2020-07-15: qty 1

## 2020-07-15 MED ORDER — METHYLPREDNISOLONE SODIUM SUCC 125 MG IJ SOLR
100.0000 mg | Freq: Once | INTRAMUSCULAR | Status: AC
  Administered 2020-07-15: 100 mg via INTRAVENOUS

## 2020-07-15 MED ORDER — SODIUM CHLORIDE 0.9 % IV SOLN
Freq: Once | INTRAVENOUS | Status: DC
  Filled 2020-07-15: qty 250

## 2020-07-15 MED ORDER — DIPHENHYDRAMINE HCL 25 MG PO CAPS
50.0000 mg | ORAL_CAPSULE | Freq: Once | ORAL | Status: AC
  Administered 2020-07-15: 50 mg via ORAL

## 2020-07-15 MED ORDER — SODIUM CHLORIDE 0.9 % IV SOLN
40.0000 mg | Freq: Once | INTRAVENOUS | Status: AC
  Administered 2020-07-15: 40 mg via INTRAVENOUS
  Filled 2020-07-15: qty 4

## 2020-07-15 MED ORDER — FAMOTIDINE IN NACL 20-0.9 MG/50ML-% IV SOLN
INTRAVENOUS | Status: AC
  Filled 2020-07-15: qty 50

## 2020-07-15 NOTE — Progress Notes (Signed)
Per Dr. Irene Limbo, ok to treat with Creat 2.12

## 2020-07-15 NOTE — Progress Notes (Signed)
HEMATOLOGY/ONCOLOGY CLINIC NOTE  Date of Service: 07/15/2020  Patient Care Team: [name], Joseph Halsted, MD as PCP - General (Internal Medicine) Welch, Peter M, MD as PCP - Cardiology (Cardiology) [name], Joseph Halsted, MD (Internal Medicine)  CHIEF COMPLAINTS/PURPOSE OF CONSULTATION:  Continued mx of myeloma  HISTORY OF PRESENTING ILLNESS:  [name] is a wonderful 65 y.o. male who has been referred to [place] by [name] for evaluation and management of elevated protein. Pt is accompanied today by his wife, [name]. The pt reports that he is doing well overall.    The pt reports he was first told that he had Diabetes two years ago. He has not previously needed to be on medications for his diabetes but has recently been started on Januvia by [name], his Endocrinologist. His blood glucose was 149 this morning. He sees [name] - NP and [name] for Cardiology. Pt has CAD and had to have open heart surgery 20 years ago. He also has HTN that he feels has been stable. His Cardiology team is currently managing his heart medications and recently told pt to discontinue taking Furosemide due kidney function on last labs. [name] and [name] have been essentially functioning as his primary care, as he does not have a PCP at this time. He has an upcoming appointment with [name] in the beginning of May and Joseph. Dwyane Welch next Tuesday. Pt is currently using Ibuprofen a couple of times per week for his knee pain.   Pt has felt the same in the last 3-6 months and denies any new bone pain of any kinds. He is eating, functioning, and moving about as normal. He has lost about 13 lbs over the last few months. Pt does not smoke and does not drink much alcohol.    Most recent lab results (09/04/2019) of CBC, BMP and Hepatic function panel is as follows: all values are WNL except for RBC at 3.64, Hgb at 9.8, HCT at 29.8, Glucose at 129, BUN at 64, Creatinine at  2.62, GFR Est Af Am at 29, CO2 at 15, Total Protein at 10.0, Albumin at 3.6, ALP at 163, ALT at 45.    On review of systems, pt reports unexpected weight loss, left knee pain and denies new back pain, new shoulder pain, new hip pain, chest pain, SOB, leg swelling, testicular pain/swelling and any other symptoms.    On PMHx the pt reports Left Knee Arthritis, CAD, CHF, Type II Diabetes, HTN, HLD, Cardiac Catheterization, Coronary Artery Bypass Graft x4 (2002). On Social Hx the pt reports that he is a non-smoker and does not drink much alcohol.  INTERVAL HISTORY:  [name] is a wonderful 65 y.o. male is here for evaluation and management of Multiple Myeloma. The patient's last visit with [place] was on 07/02/2020. The pt reports that he is doing well overall.  The pt reports that he has not been eating well and has not been to see his PCP in the interim. He is eating at about 50% of his baseline. Pt denies any current joint pain. His blood glucose has been well-controlled at home. He has started taking Eliquis as prescribed. Pt has chosen not to reach out to [name] to schedule his transplant consultation at this time. Pt notes some hard stools and has not had a bowel movement since Saturday.  Lab results today (07/15/20) of CBC w/diff and CMP is as follows: all values are  WNL except for RBC at 3.30, Hgb at 8.4, HCT at 26.0, MCV at 78.8, MCH at 25.5, RDW at 17.7, PLT at 403K, CO2 at 20, Glucose at 183, Creatinine at 2.12, Albumin at 2.7, AST at <6, GFR Est at 34.  On review of systems, pt reports low appetite, constipation and denies joint pain, fevers, chills, bone pain, abdominal pain and any other symptoms.    MEDICAL HISTORY:  Past Medical History:  Diagnosis Date  . Anemia   . Arthritis    "left knee" (11/24/2017)  . CAD (coronary artery disease)    CABG 2002  . Cancer [name])    Multiple Myeloma  . CHF (congestive heart failure) ([place])   . Chronic kidney disease   .  Chronic renal insufficiency   . Diabetes mellitus without complication ([place])   . Dilated cardiomyopathy ([place])    echo in 2009 showed improvement with a normal EF  . HTN (hypertension)   . Hyperlipemia   . Hypertension   . Noncompliance   . PAD (peripheral artery disease) ([place])   . Pneumonia     SURGICAL HISTORY: Past Surgical History:  Procedure Laterality Date  . CARDIAC CATHETERIZATION  2002, 2006  . CORONARY ARTERY BYPASS GRAFT  2002   x4 Joseph. VAN Welch  . IR FLUORO GUIDED NEEDLE PLC ASPIRATION/INJECTION LOC  10/24/2019    SOCIAL HISTORY: Social History   Socioeconomic History  . Marital status: Married    Spouse name: Not on file  . Number of children: 1  . Years of education: Not on file  . Highest education level: Not on file  Occupational History  . Occupation: Furniture conservator/restorer  Tobacco Use  . Smoking status: Never Smoker  . Smokeless tobacco: Never Used  Vaping Use  . Vaping Use: Never used  Substance and Sexual Activity  . Alcohol use: Not Currently  . Drug use: Not Currently  . Sexual activity: Yes  Other Topics Concern  . Not on file  Social History Narrative   ** Merged History Encounter **       Social Determinants of Health   Financial Resource Strain: Not on file  Food Insecurity: Not on file  Transportation Needs: Not on file  Physical Activity: Not on file  Stress: Not on file  Social Connections: Not on file  Intimate Partner Violence: Not on file    FAMILY HISTORY: Family History  Problem Relation Age of Onset  . Hypertension Father   . Diabetes Mother        DIABETIC COMA    ALLERGIES:  has No Known Allergies.  MEDICATIONS:  Current Outpatient Medications  Medication Sig Dispense Refill  . senna-docusate (SENNA S) 8.6-50 MG tablet Take 2 tablets by mouth at bedtime. 60 tablet 5  . Accu-Chek Softclix Lancets lancets Use Accu Chek softclix lancets to check blood sugar fasting or 2 hours after a meal every other day. 100 each 2  .  acyclovir (ZOVIRAX) 400 MG tablet Take 0.5 tablets (200 mg total) by mouth 2 (two) times daily. 60 tablet 11  . amLODipine (NORVASC) 5 MG tablet Take 5 mg by mouth daily.    [name] apixaban (ELIQUIS) 2.5 MG TABS tablet Take 1 tablet (2.5 mg total) by mouth 2 (two) times daily. 60 tablet 2  . atorvastatin (LIPITOR) 80 MG tablet Take 1 tablet by mouth once daily 90 tablet 3  . Blood Glucose Monitoring Suppl (ACCU-CHEK GUIDE ME) w/Device KIT 1 each by Does not apply route.    [name]  carvedilol (COREG) 25 MG tablet Take 25 mg by mouth 2 (two) times daily with a meal.    . dexamethasone (DECADRON) 4 MG tablet Take 12 mg by mouth See admin instructions. Take after chemo treatments    . glucose blood (ACCU-CHEK GUIDE) test strip Use Accu Chek test strips as instructed to check blood sugar fasting or two hours after meals, every other day. 100 each 2  . hydrALAZINE (APRESOLINE) 50 MG tablet TAKE 1 TABLET BY MOUTH THREE TIMES DAILY 270 tablet 3  . isosorbide mononitrate (IMDUR) 30 MG 24 hr tablet Take 1 tablet (30 mg total) by mouth daily. 90 tablet 2  . losartan (COZAAR) 100 MG tablet Take 100 mg by mouth daily.    . metFORMIN (GLUCOPHAGE-XR) 500 MG 24 hr tablet Take 500 mg by mouth 2 (two) times daily.    . ondansetron (ZOFRAN) 8 MG tablet Take 8 mg by mouth every 8 (eight) hours as needed for nausea or vomiting.    . potassium chloride SA (KLOR-CON) 20 MEQ tablet Take 1 tablet by mouth once daily 30 tablet 3  . predniSONE (DELTASONE) 20 MG tablet Take 1 tablet (20 mg total) by mouth daily as needed (arthiritis). 30 tablet 3  . prochlorperazine (COMPAZINE) 10 MG tablet Take 1 tablet (10 mg total) by mouth every 6 (six) hours as needed (Nausea or vomiting). 30 tablet 1  . repaglinide (PRANDIN) 1 MG tablet Take 1 tablet (1 mg total) by mouth 3 (three) times daily before meals. 90 tablet 1   No current facility-administered medications for this visit.   Facility-Administered Medications Ordered in Other Visits   Medication Dose Route Frequency Provider Last Rate Last Admin  . 0.9 %  sodium chloride infusion   Intravenous Once Brunetta Genera, MD      . carfilzomib (KYPROLIS) 70 mg in dextrose 5 % 100 mL chemo infusion  36 mg/m2 (Treatment Plan Recorded) Intravenous Once Brunetta Genera, MD      . daratumumab [facility]) 1,300 mg in sodium chloride 0.9 % 435 mL (2.6 mg/mL) chemo infusion  16 mg/kg (Treatment Plan Recorded) Intravenous Once Brunetta Genera, MD      . famotidine (PEPCID) 40 mg in sodium chloride 0.9 % 100 mL IVPB  40 mg Intravenous Once Brunetta Genera, MD      . sodium chloride flush (NS) 0.9 % injection 10 mL  10 mL Intravenous PRN Brunetta Genera, MD      . sodium chloride flush (NS) 0.9 % injection 10 mL  10 mL Intracatheter Once PRN Brunetta Genera, MD        REVIEW OF SYSTEMS:  A 10+ POINT REVIEW OF SYSTEMS WAS OBTAINED including neurology, dermatology, psychiatry, cardiac, respiratory, lymph, extremities, GI, GU, Musculoskeletal, constitutional, breasts, reproductive, HEENT.  All pertinent positives are noted in the HPI.  All others are negative.   PHYSICAL EXAMINATION: ECOG PERFORMANCE STATUS: 0 - Asymptomatic  VS reviewed - stable  Exam was given in a chair   GENERAL:alert, in no acute distress and comfortable SKIN: no acute rashes, no significant lesions EYES: conjunctiva are pink and non-injected, sclera anicteric OROPHARYNX: MMM, no exudates, no oropharyngeal erythema or ulceration NECK: supple, no JVD LYMPH:  no palpable lymphadenopathy in the cervical, axillary or inguinal regions LUNGS: clear to auscultation b/l with normal respiratory effort HEART: regular rate & rhythm ABDOMEN:  normoactive bowel sounds , non tender, not distended. No palpable hepatosplenomegaly.  Extremity: no pedal edema PSYCH: alert & oriented  x 3 with fluent speech NEURO: no focal motor/sensory deficits  LABORATORY DATA:  I have reviewed the data as  listed  . CBC Latest Ref Rng & Units 07/15/2020 07/02/2020 06/25/2020  WBC 4.0 - 10.5 K/uL 6.0 7.6 5.6  Hemoglobin 13.0 - 17.0 g/dL 8.4(L) 8.7(L) 8.5(L)  Hematocrit 39.0 - 52.0 % 26.0(L) 27.1(L) 26.7(L)  Platelets 150 - 400 K/uL 403(H) 276 304    . CMP Latest Ref Rng & Units 07/15/2020 07/02/2020 06/25/2020  Glucose 70 - 99 mg/dL 183(H) 215(H) 187(H)  BUN 8 - 23 mg/dL $Remove'18 21 22  'HCdZFUY$ Creatinine 0.61 - 1.24 mg/dL 2.12(H) 2.29(H) 2.29(H)  Sodium 135 - 145 mmol/L 138 138 138  Potassium 3.5 - 5.1 mmol/L 3.8 3.4(L) 3.3(L)  Chloride 98 - 111 mmol/L 109 112(H) 112(H)  CO2 22 - 32 mmol/L 20(L) 16(L) 19(L)  Calcium 8.9 - 10.3 mg/dL 9.2 8.7(L) 8.9  Total Protein 6.5 - 8.1 g/dL 6.7 6.5 6.9  Total Bilirubin 0.3 - 1.2 mg/dL 0.5 0.6 0.5  Alkaline Phos 38 - 126 U/L 73 71 75  AST 15 - 41 U/L <6(L) 6(L) 6(L)  ALT 0 - 44 U/L $Remo'6 9 7   'CSAWa$ 10/24/2019 FISH Panel:     10/24/2019 [facility] Surgical Pathology:    RADIOGRAPHIC STUDIES: I have personally reviewed the radiological images as listed and agreed with the findings in the report. No results found.  ASSESSMENT & PLAN:    65 yo with   1)  Newly diagnosed Multiple myeloma -09/27/19 Welch Protein at 3.4 -10/25/2019 PET/CT ([id number]) revealed "1. No specific myelomatous lesions are identified. 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  2) Anemia ? Related to CKD vs ? Plasma cell  dyscrasia 3) CKD ?etiology - HTN ? Cardiac issues ? Myeloma related. 4) recently diagnosed small Pulmonary embolism  PLAN: -Discussed pt labwork today, 07/15/20; all values are WNL except for RBC at 3.30, Hgb at 8.4, HCT at 26.0, MCV at 78.8, MCH at 25.5, RDW at 17.7, PLT at 403K, CO2 at 20, Glucose at 183, Creatinine at 2.12, Albumin at 2.7, AST at <6, GFR Est at 34. MMP & K/L light chains is in progress -The pt has no prohibitive toxicities from continuing C10D1 Daratumumab/Carfilzomib/Dexamethasone at this time. Will decrease Carfilzomib to 36 mg/Welch^2 due to severe fatigue, failure to thrive, & changes in kidney function. -Advised pt that changing renal function may be affecting his appetite.  -Encouraged pt to visit PCP within 1 week for medication management with changing renal function.  -Recommend pt hold Metformin until he can meet with his PCP given blood glucose <200.  -Continue renally adjusted dose of Eliquis and hold Aspirin -myeloma labs today. -Rx Senna w/stool softener -Will see back in 4 weeks with labs   FOLLOW UP: Plz schedule next 2 cycles of Carfilzomib/Daratumumab Labs on D1,8 and 15 of each cycle Next MD visit with D1 of next cycle of treatment in 4 weeks   The total time spent in the appt was  30 minutes and more than 50% was on counseling and direct patient cares, ordering and management of chemotherapy  All of the patient's questions were answered with apparent satisfaction. The patient knows to call the clinic with any problems, questions or concerns.    Sullivan Lone MD [place] AAHIVMS [facility] Hematology/Oncology Physician [facility]  (Office):       [phone] (Work cell):  [phone] (Fax):           [phone]  07/15/2020 9:53 AM  I, Yevette Edwards, am acting as a scribe for Joseph. Sullivan Lone.   .I have reviewed the above documentation for accuracy and completeness, and I agree with the above. Brunetta Genera MD

## 2020-07-15 NOTE — Patient Instructions (Signed)
[facility] Discharge Instructions for Patients Receiving Chemotherapy  Today you received the following chemotherapy agents daratumumab, carfilzomib  To help prevent nausea and vomiting after your treatment, we encourage you to take your nausea medication as directed.   If you develop nausea and vomiting that is not controlled by your nausea medication, call the clinic.   BELOW ARE SYMPTOMS THAT SHOULD BE REPORTED IMMEDIATELY:  *FEVER GREATER THAN 100.5 F  *CHILLS WITH OR WITHOUT FEVER  NAUSEA AND VOMITING THAT IS NOT CONTROLLED WITH YOUR NAUSEA MEDICATION  *UNUSUAL SHORTNESS OF BREATH  *UNUSUAL BRUISING OR BLEEDING  TENDERNESS IN MOUTH AND THROAT WITH OR WITHOUT PRESENCE OF ULCERS  *URINARY PROBLEMS  *BOWEL PROBLEMS  UNUSUAL RASH Items with * indicate a potential emergency and should be followed up as soon as possible.  Feel free to call the clinic should you have any questions or concerns. The clinic phone number is [phone].  Please show the [place] at check-in to the Emergency Department and triage nurse.

## 2020-07-17 LAB — MULTIPLE MYELOMA PANEL, SERUM
Albumin SerPl Elph-Mcnc: 2.8 g/dL — ABNORMAL LOW (ref 2.9–4.4)
Albumin/Glob SerPl: 1 (ref 0.7–1.7)
Alpha 1: 0.3 g/dL (ref 0.0–0.4)
Alpha2 Glob SerPl Elph-Mcnc: 1.1 g/dL — ABNORMAL HIGH (ref 0.4–1.0)
B-Globulin SerPl Elph-Mcnc: 0.9 g/dL (ref 0.7–1.3)
Gamma Glob SerPl Elph-Mcnc: 0.8 g/dL (ref 0.4–1.8)
Globulin, Total: 3.1 g/dL (ref 2.2–3.9)
IgA: 58 mg/dL — ABNORMAL LOW (ref 61–437)
IgG (Immunoglobin G), Serum: 881 mg/dL (ref 603–1613)
IgM (Immunoglobulin M), Srm: 31 mg/dL (ref 20–172)
M Protein SerPl Elph-Mcnc: 0.3 g/dL — ABNORMAL HIGH
Total Protein ELP: 5.9 g/dL — ABNORMAL LOW (ref 6.0–8.5)

## 2020-07-19 ENCOUNTER — Ambulatory Visit: Payer: Self-pay

## 2020-07-19 ENCOUNTER — Encounter: Payer: Self-pay | Admitting: Rheumatology

## 2020-07-19 ENCOUNTER — Ambulatory Visit (INDEPENDENT_AMBULATORY_CARE_PROVIDER_SITE_OTHER): Payer: Medicare Other | Admitting: Rheumatology

## 2020-07-19 ENCOUNTER — Other Ambulatory Visit: Payer: Self-pay

## 2020-07-19 VITALS — BP 95/66 | HR 68 | Resp 16 | Ht 69.0 in | Wt 158.2 lb

## 2020-07-19 DIAGNOSIS — I2693 Single subsegmental pulmonary embolism without acute cor pulmonale: Secondary | ICD-10-CM

## 2020-07-19 DIAGNOSIS — M1990 Unspecified osteoarthritis, unspecified site: Secondary | ICD-10-CM

## 2020-07-19 DIAGNOSIS — E1165 Type 2 diabetes mellitus with hyperglycemia: Secondary | ICD-10-CM

## 2020-07-19 DIAGNOSIS — I420 Dilated cardiomyopathy: Secondary | ICD-10-CM

## 2020-07-19 DIAGNOSIS — Z8639 Personal history of other endocrine, nutritional and metabolic disease: Secondary | ICD-10-CM

## 2020-07-19 DIAGNOSIS — A419 Sepsis, unspecified organism: Secondary | ICD-10-CM

## 2020-07-19 DIAGNOSIS — M1380 Other specified arthritis, unspecified site: Secondary | ICD-10-CM

## 2020-07-19 DIAGNOSIS — M79641 Pain in right hand: Secondary | ICD-10-CM | POA: Diagnosis not present

## 2020-07-19 DIAGNOSIS — Z5112 Encounter for antineoplastic immunotherapy: Secondary | ICD-10-CM

## 2020-07-19 DIAGNOSIS — I2510 Atherosclerotic heart disease of native coronary artery without angina pectoris: Secondary | ICD-10-CM | POA: Diagnosis not present

## 2020-07-19 DIAGNOSIS — C9000 Multiple myeloma not having achieved remission: Secondary | ICD-10-CM

## 2020-07-19 DIAGNOSIS — I739 Peripheral vascular disease, unspecified: Secondary | ICD-10-CM

## 2020-07-19 DIAGNOSIS — M79642 Pain in left hand: Secondary | ICD-10-CM

## 2020-07-19 DIAGNOSIS — I10 Essential (primary) hypertension: Secondary | ICD-10-CM

## 2020-07-19 DIAGNOSIS — J9601 Acute respiratory failure with hypoxia: Secondary | ICD-10-CM

## 2020-07-19 DIAGNOSIS — D649 Anemia, unspecified: Secondary | ICD-10-CM

## 2020-07-19 DIAGNOSIS — R6520 Severe sepsis without septic shock: Secondary | ICD-10-CM

## 2020-07-19 DIAGNOSIS — N1831 Chronic kidney disease, stage 3a: Secondary | ICD-10-CM

## 2020-07-19 DIAGNOSIS — N2889 Other specified disorders of kidney and ureter: Secondary | ICD-10-CM

## 2020-07-22 ENCOUNTER — Inpatient Hospital Stay: Payer: Medicare Other

## 2020-07-22 ENCOUNTER — Other Ambulatory Visit: Payer: No Typology Code available for payment source

## 2020-07-22 ENCOUNTER — Other Ambulatory Visit: Payer: Self-pay

## 2020-07-22 VITALS — BP 152/70 | HR 87 | Temp 98.7°F | Resp 18 | Wt 159.8 lb

## 2020-07-22 DIAGNOSIS — Z5111 Encounter for antineoplastic chemotherapy: Secondary | ICD-10-CM | POA: Diagnosis not present

## 2020-07-22 DIAGNOSIS — C9000 Multiple myeloma not having achieved remission: Secondary | ICD-10-CM

## 2020-07-22 DIAGNOSIS — Z5112 Encounter for antineoplastic immunotherapy: Secondary | ICD-10-CM

## 2020-07-22 DIAGNOSIS — Z7189 Other specified counseling: Secondary | ICD-10-CM

## 2020-07-22 LAB — CBC WITH DIFFERENTIAL/PLATELET
Abs Immature Granulocytes: 0.15 10*3/uL — ABNORMAL HIGH (ref 0.00–0.07)
Basophils Absolute: 0 10*3/uL (ref 0.0–0.1)
Basophils Relative: 0 %
Eosinophils Absolute: 0.1 10*3/uL (ref 0.0–0.5)
Eosinophils Relative: 1 %
HCT: 27.6 % — ABNORMAL LOW (ref 39.0–52.0)
Hemoglobin: 8.7 g/dL — ABNORMAL LOW (ref 13.0–17.0)
Immature Granulocytes: 2 %
Lymphocytes Relative: 16 %
Lymphs Abs: 1.4 10*3/uL (ref 0.7–4.0)
MCH: 25.4 pg — ABNORMAL LOW (ref 26.0–34.0)
MCHC: 31.5 g/dL (ref 30.0–36.0)
MCV: 80.7 fL (ref 80.0–100.0)
Monocytes Absolute: 0.9 10*3/uL (ref 0.1–1.0)
Monocytes Relative: 10 %
Neutro Abs: 5.8 10*3/uL (ref 1.7–7.7)
Neutrophils Relative %: 71 %
Platelets: 274 10*3/uL (ref 150–400)
RBC: 3.42 MIL/uL — ABNORMAL LOW (ref 4.22–5.81)
RDW: 18.6 % — ABNORMAL HIGH (ref 11.5–15.5)
WBC: 8.3 10*3/uL (ref 4.0–10.5)
nRBC: 0 % (ref 0.0–0.2)

## 2020-07-22 LAB — CMP (CANCER CENTER ONLY)
ALT: <6 U/L (ref 0–44)
AST: <6 U/L — ABNORMAL LOW (ref 15–41)
Albumin: 2.7 g/dL — ABNORMAL LOW (ref 3.5–5.0)
Alkaline Phosphatase: 77 U/L (ref 38–126)
Anion gap: 11 (ref 5–15)
BUN: 21 mg/dL (ref 8–23)
CO2: 19 mmol/L — ABNORMAL LOW (ref 22–32)
Calcium: 9 mg/dL (ref 8.9–10.3)
Chloride: 109 mmol/L (ref 98–111)
Creatinine: 2.09 mg/dL — ABNORMAL HIGH (ref 0.61–1.24)
GFR, Estimated: 34 mL/min — ABNORMAL LOW (ref >60)
Glucose, Bld: 165 mg/dL — ABNORMAL HIGH (ref 70–99)
Potassium: 3.8 mmol/L (ref 3.5–5.1)
Sodium: 139 mmol/L (ref 135–145)
Total Bilirubin: 0.6 mg/dL (ref 0.3–1.2)
Total Protein: 6.7 g/dL (ref 6.5–8.1)

## 2020-07-22 MED ORDER — DEXTROSE 5 % IV SOLN
36.0000 mg/m2 | Freq: Once | INTRAVENOUS | Status: AC
  Administered 2020-07-22: 70 mg via INTRAVENOUS
  Filled 2020-07-22: qty 30

## 2020-07-22 MED ORDER — DIPHENHYDRAMINE HCL 25 MG PO CAPS
ORAL_CAPSULE | ORAL | Status: AC
  Filled 2020-07-22: qty 1

## 2020-07-22 MED ORDER — ACETAMINOPHEN 325 MG PO TABS
ORAL_TABLET | ORAL | Status: AC
  Filled 2020-07-22: qty 2

## 2020-07-22 MED ORDER — SODIUM CHLORIDE 0.9 % IV SOLN
Freq: Once | INTRAVENOUS | Status: AC
  Filled 2020-07-22: qty 250

## 2020-07-22 MED ORDER — METHYLPREDNISOLONE SODIUM SUCC 125 MG IJ SOLR
60.0000 mg | Freq: Once | INTRAMUSCULAR | Status: AC
  Administered 2020-07-22: 60 mg via INTRAVENOUS

## 2020-07-22 MED ORDER — METHYLPREDNISOLONE SODIUM SUCC 125 MG IJ SOLR
INTRAMUSCULAR | Status: AC
  Filled 2020-07-22: qty 2

## 2020-07-22 MED ORDER — DIPHENHYDRAMINE HCL 25 MG PO CAPS
25.0000 mg | ORAL_CAPSULE | Freq: Once | ORAL | Status: AC
  Administered 2020-07-22: 25 mg via ORAL

## 2020-07-22 MED ORDER — ACETAMINOPHEN 325 MG PO TABS
650.0000 mg | ORAL_TABLET | Freq: Once | ORAL | Status: AC
  Administered 2020-07-22: 650 mg via ORAL

## 2020-07-22 NOTE — Patient Instructions (Signed)
[facility] Discharge Instructions for Patients Receiving Chemotherapy  Today you received the following chemotherapy agents: carfilzomib.  To help prevent nausea and vomiting after your treatment, we encourage you to take your nausea medication as directed.   If you develop nausea and vomiting that is not controlled by your nausea medication, call the clinic.   BELOW ARE SYMPTOMS THAT SHOULD BE REPORTED IMMEDIATELY:  *FEVER GREATER THAN 100.5 F  *CHILLS WITH OR WITHOUT FEVER  NAUSEA AND VOMITING THAT IS NOT CONTROLLED WITH YOUR NAUSEA MEDICATION  *UNUSUAL SHORTNESS OF BREATH  *UNUSUAL BRUISING OR BLEEDING  TENDERNESS IN MOUTH AND THROAT WITH OR WITHOUT PRESENCE OF ULCERS  *URINARY PROBLEMS  *BOWEL PROBLEMS  UNUSUAL RASH Items with * indicate a potential emergency and should be followed up as soon as possible.  Feel free to call the clinic should you have any questions or concerns. The clinic phone number is [phone].  Please show the CHEMO ALERT CARD at check-in to the Emergency Department and triage nurse.   

## 2020-07-22 NOTE — Progress Notes (Signed)
Per Dr. Irene Limbo: okay to treat with Scr of 2.09

## 2020-07-24 LAB — MULTIPLE MYELOMA PANEL, SERUM
Albumin SerPl Elph-Mcnc: 2.8 g/dL — ABNORMAL LOW (ref 2.9–4.4)
Albumin/Glob SerPl: 1.1 (ref 0.7–1.7)
Alpha 1: 0.3 g/dL (ref 0.0–0.4)
Alpha2 Glob SerPl Elph-Mcnc: 1 g/dL (ref 0.4–1.0)
B-Globulin SerPl Elph-Mcnc: 0.8 g/dL (ref 0.7–1.3)
Gamma Glob SerPl Elph-Mcnc: 0.8 g/dL (ref 0.4–1.8)
Globulin, Total: 2.8 g/dL (ref 2.2–3.9)
IgA: 62 mg/dL (ref 61–437)
IgG (Immunoglobin G), Serum: 862 mg/dL (ref 603–1613)
IgM (Immunoglobulin M), Srm: 28 mg/dL (ref 20–172)
M Protein SerPl Elph-Mcnc: 0.2 g/dL — ABNORMAL HIGH
Total Protein ELP: 5.6 g/dL — ABNORMAL LOW (ref 6.0–8.5)

## 2020-07-25 ENCOUNTER — Ambulatory Visit (INDEPENDENT_AMBULATORY_CARE_PROVIDER_SITE_OTHER): Payer: Medicare Other | Admitting: Internal Medicine

## 2020-07-25 ENCOUNTER — Other Ambulatory Visit: Payer: Self-pay

## 2020-07-25 ENCOUNTER — Encounter: Payer: Self-pay | Admitting: Internal Medicine

## 2020-07-25 VITALS — BP 110/70 | HR 72 | Temp 99.2°F | Wt 160.5 lb

## 2020-07-25 DIAGNOSIS — I10 Essential (primary) hypertension: Secondary | ICD-10-CM

## 2020-07-25 DIAGNOSIS — Z23 Encounter for immunization: Secondary | ICD-10-CM

## 2020-07-25 DIAGNOSIS — E7800 Pure hypercholesterolemia, unspecified: Secondary | ICD-10-CM

## 2020-07-25 DIAGNOSIS — I2510 Atherosclerotic heart disease of native coronary artery without angina pectoris: Secondary | ICD-10-CM

## 2020-07-25 DIAGNOSIS — C9000 Multiple myeloma not having achieved remission: Secondary | ICD-10-CM | POA: Diagnosis not present

## 2020-07-25 DIAGNOSIS — K5900 Constipation, unspecified: Secondary | ICD-10-CM

## 2020-07-25 DIAGNOSIS — I2693 Single subsegmental pulmonary embolism without acute cor pulmonale: Secondary | ICD-10-CM

## 2020-07-25 DIAGNOSIS — E1165 Type 2 diabetes mellitus with hyperglycemia: Secondary | ICD-10-CM | POA: Diagnosis not present

## 2020-07-25 DIAGNOSIS — N1831 Chronic kidney disease, stage 3a: Secondary | ICD-10-CM

## 2020-07-25 LAB — POCT GLYCOSYLATED HEMOGLOBIN (HGB A1C): Hemoglobin A1C: 8.5 % — AB (ref 4.0–5.6)

## 2020-07-25 NOTE — Patient Instructions (Addendum)
-  Nice seeing you today!!  -Take miralax once daily to keep bowels regular.  -Pneumonia vaccine today.  -Schedule follow up in 3 months for your physical. Please come in fasting that day.

## 2020-07-25 NOTE — Addendum Note (Signed)
Addended by: Westley Hummer B on: 07/25/2020 05:35 PM   Modules accepted: Orders

## 2020-07-25 NOTE — Progress Notes (Signed)
Established Patient Office Visit     This visit occurred during the SARS-CoV-2 public health emergency.  Safety protocols were in place, including screening questions prior to the visit, additional usage of staff PPE, and extensive cleaning of exam room while observing appropriate contact time as indicated for disinfecting solutions.    CC/Reason for Visit: Follow-up chronic medical conditions  HPI: [name] is a 65 y.o. male who is coming in today for the above mentioned reasons.  I only saw him once back in March 2021.  He has a complex past medical history that is significant for coronary artery disease status post CABG in 2002 with dilated cardiomyopathy.  He has a history of hypertension, hyperlipidemia and type 2 diabetes.  He is followed by Dr. [name], endocrinology.  He is currently only on Prandin.  He has stage III chronic kidney disease.  He was diagnosed with multiple myeloma last year and is currently undergoing treatment with oncology.  He was hospitalized back in September 2021 with sepsis due to aspiration pneumonia.  During that hospitalization he was also found to have a pulmonary embolism and has been on Eliquis ever since.  His only complaint today is constipation.  He has a bowel movement about once every 3 to 4 days.  He is fully vaccinated against COVID.  He is overdue for pneumonia vaccination.   Past Medical/Surgical History: Past Medical History:  Diagnosis Date  . Anemia   . Arthritis    "left knee" (11/24/2017)  . CAD (coronary artery disease)    CABG 2002  . Cancer [facility])    Multiple Myeloma  . CHF (congestive heart failure) ([place])   . Chronic kidney disease   . Chronic renal insufficiency   . Diabetes mellitus without complication ([place])   . Dilated cardiomyopathy ([place])    echo in 2009 showed improvement with a normal EF  . HTN (hypertension)   . Hyperlipemia   . Hypertension   . Noncompliance   . PAD (peripheral artery disease) ([place])   .  Pneumonia     Past Surgical History:  Procedure Laterality Date  . CARDIAC CATHETERIZATION  2002, 2006  . CORONARY ARTERY BYPASS GRAFT  2002   x4 DR. VAN TRIGT  . IR FLUORO GUIDED NEEDLE PLC ASPIRATION/INJECTION LOC  10/24/2019    Social History:  reports that he has never smoked. He has never used smokeless tobacco. He reports previous alcohol use. He reports previous drug use.  Allergies: No Known Allergies  Family History:  Family History  Problem Relation Age of Onset  . Hypertension Father   . Diabetes Mother        DIABETIC COMA  . Healthy Daughter      Current Outpatient Medications:  .  Accu-Chek Softclix Lancets lancets, Use Accu Chek softclix lancets to check blood sugar fasting or 2 hours after a meal every other day., Disp: 100 each, Rfl: 2 .  acyclovir (ZOVIRAX) 400 MG tablet, Take 0.5 tablets (200 mg total) by mouth 2 (two) times daily., Disp: 60 tablet, Rfl: 11 .  amLODipine (NORVASC) 5 MG tablet, Take 5 mg by mouth daily., Disp: , Rfl:  .  apixaban (ELIQUIS) 2.5 MG TABS tablet, Take 1 tablet (2.5 mg total) by mouth 2 (two) times daily., Disp: 60 tablet, Rfl: 2 .  atorvastatin (LIPITOR) 80 MG tablet, Take 1 tablet by mouth once daily, Disp: 90 tablet, Rfl: 3 .  Blood Glucose Monitoring Suppl (ACCU-CHEK GUIDE ME) w/Device KIT, 1  each by Does not apply route., Disp: , Rfl:  .  carvedilol (COREG) 25 MG tablet, Take 25 mg by mouth 2 (two) times daily with a meal., Disp: , Rfl:  .  dexamethasone (DECADRON) 4 MG tablet, Take 12 mg by mouth See admin instructions. Take after chemo treatments, Disp: , Rfl:  .  glucose blood (ACCU-CHEK GUIDE) test strip, Use Accu Chek test strips as instructed to check blood sugar fasting or two hours after meals, every other day., Disp: 100 each, Rfl: 2 .  hydrALAZINE (APRESOLINE) 50 MG tablet, TAKE 1 TABLET BY MOUTH THREE TIMES DAILY, Disp: 270 tablet, Rfl: 3 .  isosorbide mononitrate (IMDUR) 30 MG 24 hr tablet, Take 1 tablet (30 mg  total) by mouth daily., Disp: 90 tablet, Rfl: 2 .  losartan (COZAAR) 100 MG tablet, Take 100 mg by mouth daily., Disp: , Rfl:  .  potassium chloride SA (KLOR-CON) 20 MEQ tablet, Take 1 tablet by mouth once daily, Disp: 30 tablet, Rfl: 3 .  prochlorperazine (COMPAZINE) 10 MG tablet, Take 1 tablet (10 mg total) by mouth every 6 (six) hours as needed (Nausea or vomiting)., Disp: 30 tablet, Rfl: 1 .  repaglinide (PRANDIN) 1 MG tablet, Take 1 tablet (1 mg total) by mouth 3 (three) times daily before meals., Disp: 90 tablet, Rfl: 1 .  senna-docusate (SENNA S) 8.6-50 MG tablet, Take 2 tablets by mouth at bedtime., Disp: 60 tablet, Rfl: 5 .  predniSONE (DELTASONE) 20 MG tablet, Take 1 tablet (20 mg total) by mouth daily as needed (arthiritis). (Patient not taking: Reported on 07/25/2020), Disp: 30 tablet, Rfl: 3 No current facility-administered medications for this visit.  Facility-Administered Medications Ordered in Other Visits:  .  sodium chloride flush (NS) 0.9 % injection 10 mL, 10 mL, Intravenous, PRN, Irene Limbo, Cloria [place], MD .  sodium chloride flush (NS) 0.9 % injection 10 mL, 10 mL, Intracatheter, Once PRN, Brunetta Genera, MD  Review of Systems:  Constitutional: Denies fever, chills, diaphoresis, appetite change and fatigue.  HEENT: Denies photophobia, eye pain, redness, hearing loss, ear pain, congestion, sore throat, rhinorrhea, sneezing, mouth sores, trouble swallowing, neck pain, neck stiffness and tinnitus.   Respiratory: Denies SOB, DOE, cough, chest tightness,  and wheezing.   Cardiovascular: Denies chest pain, palpitations and leg swelling.  Gastrointestinal: Denies nausea, vomiting, abdominal pain, diarrhea, constipation, blood in stool and abdominal distention.  Genitourinary: Denies dysuria, urgency, frequency, hematuria, flank pain and difficulty urinating.  Endocrine: Denies: hot or cold intolerance, sweats, changes in hair or nails, polyuria, polydipsia. Musculoskeletal:  Denies myalgias, back pain, joint swelling, arthralgias and gait problem.  Skin: Denies pallor, rash and wound.  Neurological: Denies dizziness, seizures, syncope, weakness, light-headedness, numbness and headaches.  Hematological: Denies adenopathy. Easy bruising, personal or family bleeding history  Psychiatric/Behavioral: Denies suicidal ideation, mood changes, confusion, nervousness, sleep disturbance and agitation    Physical Exam: Vitals:   07/25/20 1446  BP: 110/70  Pulse: 72  Temp: 99.2 F (37.3 C)  TempSrc: Oral  SpO2: 97%  Weight: 160 lb 8 oz (72.8 kg)    Body mass index is 23.7 kg/m.   Constitutional: NAD, calm, comfortable Eyes: PERRL, lids and conjunctivae normal, wears corrective lenses ENMT: Mucous membranes are moist. Respiratory: clear to auscultation bilaterally, no wheezing, no crackles. Normal respiratory effort. No accessory muscle use.  Cardiovascular: Regular rate and rhythm, no murmurs / rubs / gallops. No extremity edema.  Psychiatric: Normal judgment and insight. Alert and oriented x 3. Normal mood.  Impression and Plan:  Uncontrolled type 2 diabetes mellitus with hyperglycemia ([place]) -A1c is not at goal at 8.5 today. -It appears he is only on Prandin and is followed by endocrinology.  Primary hypertension -Well-controlled.  Pure hypercholesterolemia -Last LDL was 39 in February 2021.  Multiple myeloma, remission status unspecified (HCC) -Currently undergoing treatment, followed by Dr. Irene Limbo.  Chronic renal impairment, stage 3a/4 (HCC) -Baseline creatinine remains around 2.1. -Nephrology referral today.  Single subsegmental pulmonary embolism without acute cor pulmonale (HCC) -Maintained on anticoagulation with Eliquis.  Constipation, unspecified constipation type -Advise use of daily MiraLAX to aim for 1 soft bowel movement every 1 to 2 days.  Need for vaccination against Streptococcus pneumoniae -PPSV23 administered  today   Patient Instructions  -Nice seeing you today!!  -Take miralax once daily to keep bowels regular.  -Pneumonia vaccine today.  -Schedule follow up in 3 months for your physical. Please come in fasting that day.       Lelon Frohlich, MD [facility]

## 2020-07-26 ENCOUNTER — Telehealth: Payer: Self-pay | Admitting: Hematology

## 2020-07-26 NOTE — Telephone Encounter (Signed)
Scheduled per 12/21 los, patient has been called and notified of upcoming appointments.

## 2020-07-27 ENCOUNTER — Other Ambulatory Visit: Payer: Self-pay | Admitting: Nurse Practitioner

## 2020-07-29 ENCOUNTER — Inpatient Hospital Stay: Payer: Medicare Other

## 2020-07-29 ENCOUNTER — Other Ambulatory Visit: Payer: No Typology Code available for payment source

## 2020-08-01 ENCOUNTER — Other Ambulatory Visit: Payer: No Typology Code available for payment source

## 2020-08-02 ENCOUNTER — Other Ambulatory Visit: Payer: Self-pay

## 2020-08-06 ENCOUNTER — Encounter: Payer: Self-pay | Admitting: Endocrinology

## 2020-08-06 ENCOUNTER — Ambulatory Visit (INDEPENDENT_AMBULATORY_CARE_PROVIDER_SITE_OTHER): Payer: Medicare Other | Admitting: Endocrinology

## 2020-08-06 ENCOUNTER — Other Ambulatory Visit: Payer: Self-pay

## 2020-08-06 VITALS — BP 124/64 | HR 90 | Ht 69.0 in | Wt 152.4 lb

## 2020-08-06 DIAGNOSIS — E1165 Type 2 diabetes mellitus with hyperglycemia: Secondary | ICD-10-CM | POA: Diagnosis not present

## 2020-08-06 DIAGNOSIS — E785 Hyperlipidemia, unspecified: Secondary | ICD-10-CM | POA: Diagnosis not present

## 2020-08-06 DIAGNOSIS — I2510 Atherosclerotic heart disease of native coronary artery without angina pectoris: Secondary | ICD-10-CM | POA: Diagnosis not present

## 2020-08-06 LAB — GLUCOSE, POCT (MANUAL RESULT ENTRY): POC Glucose: 138 mg/dl — AB (ref 70–99)

## 2020-08-06 NOTE — Patient Instructions (Addendum)
Stop Metformin  Take Repeglanide before each meal unless not eating any starch at a given meal Take 2 pills three times a day on day on infusion  Check blood sugars on waking up 2-3 days a week  Also check blood sugars about 2 hours after meals and do this after different meals by rotation  Recommended blood sugar levels on waking up are 90-130 and about 2 hours after meal is 130-160  Please bring your blood sugar monitor to each visit, thank you  Get Diabetic Boost or Glucerna shakes

## 2020-08-06 NOTE — Progress Notes (Signed)
Patient ID: [name], male   DOB: Dec 13, 1955, 65 y.o.   MRN: [id number]           Reason for Appointment: Follow-up for Type 2 Diabetes   History of Present Illness:          Date of diagnosis of type 2 diabetes mellitus:   2018      Background history:  He has never been told to have diabetes but review of his previous labs indicate that he had high blood sugar 179 in 2014 His highest sugar has been 234 in 10/2016 when his A1c was 8.8, not clear if he was symptomatic at that time He was recommended referral for diabetes at that time but he did not make an appointment  Recent history:   A1c is 8.5, was 9.7   Non-insulin hypoglycemic drugs the patient is taking are: Metformin 500, Prandin 1-2 mg 3 times daily  Current management, blood sugar patterns and problems identified:   He did not bring his monitor for download  Unclear how often he is checking his sugars as he claims that his blood sugars are always 124/125 and not any higher  Lab glucose was 165  Today after eating chicken noodle soup his blood sugar is 138 in the office  Also not clear if he is getting high sugars after getting Solu-Medrol with his chemotherapy infusions about once a month  He has been was advised to take 2 tablets of Prandin instead of 1 with meals when he is getting infusions  Supposed to take Prandin before each meal and he thinks he is doing this  Taking only 500 mg metformin currently, dose reduced because of renal dysfunction  Renal function is significantly worse and staying the same now            Glucose monitoring:  Very irregularly, has Accu-Chek guide meter  Blood sugars not available  Previous readings:  PRE-MEAL Fasting Lunch Dinner Bedtime Overall  Glucose range:  115-162  223, 226  125  114   Mean/median:  135     150   POST-MEAL PC Breakfast PC Lunch PC Dinner  Glucose range:  ? ?  Mean/median:         Dietician visit, most recent: 2/21  Weight  history:  Wt Readings from Last 3 Encounters:  08/06/20 152 lb 6.4 oz (69.1 kg)  07/25/20 160 lb 8 oz (72.8 kg)  07/22/20 159 lb 12 oz (72.5 kg)    Glycemic control:   Lab Results  Component Value Date   HGBA1C 8.5 (A) 07/25/2020   HGBA1C 9.7 (H) 05/03/2020   HGBA1C 8.1 (H) 04/05/2020   Lab Results  Component Value Date   MICROALBUR 12.6 (H) 07/21/2018   LDLCALC 39 09/04/2019   CREATININE 2.09 (H) 07/22/2020   Lab Results  Component Value Date   MICRALBCREAT 10.0 07/21/2018    Lab Results  Component Value Date   FRUCTOSAMINE 327 (H) 05/28/2020   FRUCTOSAMINE 345 (H) 04/05/2020   FRUCTOSAMINE 322 (H) 02/02/2020    No visits with results within 1 Week(s) from this visit.  Latest known visit with results is:  Office Visit on 07/25/2020  Component Date Value Ref Range Status  . Hemoglobin A1C 07/25/2020 8.5* 4.0 - 5.6 % Final    Allergies as of 08/06/2020   No Known Allergies     Medication List       Accurate as of August 06, 2020  1:41 PM. If you have any  questions, ask your nurse or doctor.        Accu-Chek Guide Me w/Device Kit 1 each by Does not apply route.   Accu-Chek Guide test strip Generic drug: glucose blood Use Accu Chek test strips as instructed to check blood sugar fasting or two hours after meals, every other day.   Accu-Chek Softclix Lancets lancets Use Accu Chek softclix lancets to check blood sugar fasting or 2 hours after a meal every other day.   acyclovir 400 MG tablet Commonly known as: ZOVIRAX Take 0.5 tablets (200 mg total) by mouth 2 (two) times daily.   amLODipine 5 MG tablet Commonly known as: NORVASC Take 5 mg by mouth daily.   apixaban 2.5 MG Tabs tablet Commonly known as: ELIQUIS Take 1 tablet (2.5 mg total) by mouth 2 (two) times daily.   atorvastatin 80 MG tablet Commonly known as: LIPITOR Take 1 tablet by mouth once daily   carvedilol 25 MG tablet Commonly known as: COREG Take 25 mg by mouth 2 (two) times  daily with a meal.   dexamethasone 4 MG tablet Commonly known as: DECADRON Take 12 mg by mouth See admin instructions. Take after chemo treatments   hydrALAZINE 50 MG tablet Commonly known as: APRESOLINE TAKE 1 TABLET BY MOUTH THREE TIMES DAILY   isosorbide mononitrate 30 MG 24 hr tablet Commonly known as: IMDUR Take 1 tablet (30 mg total) by mouth daily.   losartan 100 MG tablet Commonly known as: COZAAR Take 1 tablet by mouth once daily   potassium chloride SA 20 MEQ tablet Commonly known as: KLOR-CON Take 1 tablet by mouth once daily   predniSONE 20 MG tablet Commonly known as: DELTASONE Take 1 tablet (20 mg total) by mouth daily as needed (arthiritis).   prochlorperazine 10 MG tablet Commonly known as: COMPAZINE Take 1 tablet (10 mg total) by mouth every 6 (six) hours as needed (Nausea or vomiting).   repaglinide 1 MG tablet Commonly known as: Prandin Take 1 tablet (1 mg total) by mouth 3 (three) times daily before meals.   senna-docusate 8.6-50 MG tablet Commonly known as: Senna S Take 2 tablets by mouth at bedtime.       Allergies: No Known Allergies  Past Medical History:  Diagnosis Date  . Anemia   . Arthritis    "left knee" (11/24/2017)  . CAD (coronary artery disease)    CABG 2002  . Cancer [facility])    Multiple Myeloma  . CHF (congestive heart failure) ([place])   . Chronic kidney disease   . Chronic renal insufficiency   . Diabetes mellitus without complication ([place])   . Dilated cardiomyopathy ([place])    echo in 2009 showed improvement with a normal EF  . HTN (hypertension)   . Hyperlipemia   . Hypertension   . Noncompliance   . PAD (peripheral artery disease) ([place])   . Pneumonia     Past Surgical History:  Procedure Laterality Date  . CARDIAC CATHETERIZATION  2002, 2006  . CORONARY ARTERY BYPASS GRAFT  2002   x4 DR. VAN TRIGT  . IR FLUORO GUIDED NEEDLE PLC ASPIRATION/INJECTION LOC  10/24/2019    Family History  Problem Relation Age of Onset   . Hypertension Father   . Diabetes Mother        DIABETIC COMA  . Healthy Daughter     Social History:  reports that he has never smoked. He has never used smokeless tobacco. He reports previous alcohol use. He reports previous drug use.  Review of Systems     Lipid history: On atorvastatin 80 mg from his cardiologist since diagnosis of CAD, labs as follows    Lab Results  Component Value Date   CHOL 92 (L) 09/04/2019   HDL 35 (L) 09/04/2019   LDLCALC 39 09/04/2019   LDLDIRECT 149.4 01/07/2011   TRIG 89 09/04/2019   CHOLHDL 2.6 09/04/2019           Hypertension: Has been followed by cardiologist for blood pressure management Recent readings:  BP Readings from Last 3 Encounters:  08/06/20 124/64  07/25/20 110/70  07/22/20 (!) 152/70   RENAL dysfunction is improving  Renal dysfunction not related to diabetes  No history of proteinuria  Lab Results  Component Value Date   CREATININE 2.09 (H) 07/22/2020   CREATININE 2.12 (H) 07/15/2020   CREATININE 2.29 (H) 07/02/2020    Most recent eye exam was in 2018  Most recent foot exam: 07/2018  Currently known complications of diabetes:none  LABS:  No visits with results within 1 Week(s) from this visit.  Latest known visit with results is:  Office Visit on 07/25/2020  Component Date Value Ref Range Status  . Hemoglobin A1C 07/25/2020 8.5* 4.0 - 5.6 % Final    Physical Examination:  BP 124/64   Pulse 90   Ht $R'5\' 9"'vj$  (1.753 m)   Wt 152 lb 6.4 oz (69.1 kg)   SpO2 99%   BMI 22.51 kg/m          ASSESSMENT:  Diabetes type 2, mild  See history of present illness for detailed discussion of current diabetes management, blood sugar patterns and problems identified  A1c is now 8.5, previously 9.7  He is on a regimen of Metformin and Prandin before each meal He is a poor historian and not clear what regimen he is taking off his medications Also likely not checking blood sugars much and does not recall what  the numbers are He still gets Solu-Medrol once a month for his infusions and likely has high sugars with this  Today however blood sugar is 138 Explained to the patient that his A1c still indicates overall high readings   PLAN:    Stop Metformin since his creatinine is now stable around 2 and higher than usual  Repeglanide 1 mg as before to be taken before each meal upto 30 min before eating, take 2 tablets before meals on day of infusion when he gets steroids and next day also if he remembers  Check sugars daily consistently 2 or 3 times a weeks at least especially after meals  He will call if he has consistently high or low blood sugars Avoid all drinks with sugar    There are no Patient Instructions on file for this visit.     Elayne Snare 08/06/2020, 1:41 PM   Note: This office note was prepared with Dragon voice recognition system technology. Any transcriptional errors that result from this process are unintentional.

## 2020-08-07 ENCOUNTER — Other Ambulatory Visit: Payer: Self-pay | Admitting: *Deleted

## 2020-08-07 MED ORDER — REPAGLINIDE 1 MG PO TABS: 1.0000 mg | ORAL_TABLET | Freq: Three times a day (TID) | ORAL | 1 refills | Status: DC

## 2020-08-08 ENCOUNTER — Telehealth: Payer: No Typology Code available for payment source | Admitting: Internal Medicine

## 2020-08-11 NOTE — Progress Notes (Signed)
HEMATOLOGY/ONCOLOGY CLINIC NOTE  Date of Service: 08/11/2020  Patient Care Team: Isaac Bliss, Rayford Halsted, MD as PCP - General (Internal Medicine) Martinique, Peter M, MD as PCP - Cardiology (Cardiology) Isaac Bliss, Rayford Halsted, MD (Internal Medicine)  CHIEF COMPLAINTS/PURPOSE OF CONSULTATION:  Continued mx of myeloma  HISTORY OF PRESENTING ILLNESS:  [name] is a wonderful 65 y.o. male who has been referred to [place] by Dr Servando Snare for evaluation and management of elevated protein. Pt is accompanied today by his wife, Mrs. Lasecki. The pt reports that he is doing well overall.    The pt reports he was first told that he had Diabetes two years ago. He has not previously needed to be on medications for his diabetes but has recently been started on Januvia by Dr. Elayne Snare, his Endocrinologist. His blood glucose was 149 this morning. He sees Burtis Junes - NP and Dr. Martinique for Cardiology. Pt has CAD and had to have open Welch surgery 20 years ago. He also has HTN that he feels has been stable. His Cardiology team is currently managing his Welch medications and recently told pt to discontinue taking Furosemide due kidney function on last labs. Dr. Martinique and Truitt Merle have been essentially functioning as his primary care, as he does not have a PCP at this time. He has an upcoming appointment with Truitt Merle in the beginning of May and Dr. [name] next Tuesday. Pt is currently using Ibuprofen a couple of times per week for his knee pain.   Pt has felt the same in the last 3-6 months and denies any new bone pain of any kinds. He is eating, functioning, and moving about as normal. He has lost about 13 lbs over the last few months. Pt does not smoke and does not drink much alcohol.    Most recent lab results (09/04/2019) of CBC, BMP and Hepatic function panel is as follows: all values are WNL except for RBC at 3.64, Hgb at 9.8, HCT at 29.8, Glucose at 129, BUN at 64, Creatinine at  2.62, GFR Est Af Am at 29, CO2 at 15, Total Protein at 10.0, Albumin at 3.6, ALP at 163, ALT at 45.    On review of systems, pt reports unexpected weight loss, left knee pain and denies new back pain, new shoulder pain, new hip pain, chest pain, SOB, leg swelling, testicular pain/swelling and any other symptoms.    On PMHx the pt reports Left Knee Arthritis, CAD, CHF, Type II Diabetes, HTN, HLD, Cardiac Catheterization, Coronary Artery Bypass Graft x4 (2002). On Social Hx the pt reports that he is a non-smoker and does not drink much alcohol.  INTERVAL HISTORY:   [name] is a wonderful 65 y.o. male is here for evaluation and management of Multiple Myeloma. The patient's last visit with [place] was on 07/15/2020. The pt reports that he is doing well overall.  The pt reports that he has no new symptoms or concerns. His fatigue is unchanged, but his appetite has been increasing slowly. The pt notes that he visited Dr. Estanislado Pandy regarding his arthritis on 07/19/2020, but he is not on any new medications yet. The pt also visited Dr. [name] for his diabetes, who took him off Metformin.  The pt notes that his anemia has worsened lately. He notes that his hands get very cold more often.   The pt's last SPEP showed his m-protein was down to 0.2.   Lab results today 08/12/2020 of CBC  w/diff and CMP is as follows: all values are WNL except for RBC at 3.37, Hgb of 8.6, HCT of 27.9, MCH of 25.5, RDW of 18.0, CO2 at 18, Glucose of 243, BUN of 24, Creatinine of 2.43, Albumin of 2.8, AST <6, GFR est of 29.  On review of systems, pt reports fatigue and denies decreased appetite, SOB, chest pain, abdominal pain, back pain, leg swelling and any other symptoms.  MEDICAL HISTORY:  Past Medical History:  Diagnosis Date  . Anemia   . Arthritis    "left knee" (11/24/2017)  . CAD (coronary artery disease)    CABG 2002  . Cancer [facility])    Multiple Myeloma  . CHF (congestive Welch failure) ([place])   . Chronic  kidney disease   . Chronic renal insufficiency   . Diabetes mellitus without complication ([place])   . Dilated cardiomyopathy ([place])    echo in 2009 showed improvement with a normal EF  . HTN (hypertension)   . Hyperlipemia   . Hypertension   . Noncompliance   . PAD (peripheral artery disease) ([place])   . Pneumonia     SURGICAL HISTORY: Past Surgical History:  Procedure Laterality Date  . CARDIAC CATHETERIZATION  2002, 2006  . CORONARY ARTERY BYPASS GRAFT  2002   x4 DR. VAN TRIGT  . IR FLUORO GUIDED NEEDLE PLC ASPIRATION/INJECTION LOC  10/24/2019    SOCIAL HISTORY: Social History   Socioeconomic History  . Marital status: Married    Spouse name: Not on file  . Number of children: 1  . Years of education: Not on file  . Highest education level: Not on file  Occupational History  . Occupation: Furniture conservator/restorer  Tobacco Use  . Smoking status: Never Smoker  . Smokeless tobacco: Never Used  Vaping Use  . Vaping Use: Never used  Substance and Sexual Activity  . Alcohol use: Not Currently  . Drug use: Not Currently  . Sexual activity: Yes  Other Topics Concern  . Not on file  Social History Narrative   ** Merged History Encounter **       Social Determinants of Health   Financial Resource Strain: Not on file  Food Insecurity: Not on file  Transportation Needs: Not on file  Physical Activity: Not on file  Stress: Not on file  Social Connections: Not on file  Intimate Partner Violence: Not on file    FAMILY HISTORY: Family History  Problem Relation Age of Onset  . Hypertension Father   . Diabetes Mother        DIABETIC COMA  . Healthy Daughter     ALLERGIES:  has No Known Allergies.  MEDICATIONS:  Current Outpatient Medications  Medication Sig Dispense Refill  . Accu-Chek Softclix Lancets lancets Use Accu Chek softclix lancets to check blood sugar fasting or 2 hours after a meal every other day. 100 each 2  . acyclovir (ZOVIRAX) 400 MG tablet Take 0.5 tablets (200  mg total) by mouth 2 (two) times daily. 60 tablet 11  . amLODipine (NORVASC) 5 MG tablet Take 5 mg by mouth daily.    [name] apixaban (ELIQUIS) 2.5 MG TABS tablet Take 1 tablet (2.5 mg total) by mouth 2 (two) times daily. 60 tablet 2  . atorvastatin (LIPITOR) 80 MG tablet Take 1 tablet by mouth once daily 90 tablet 3  . Blood Glucose Monitoring Suppl (ACCU-CHEK GUIDE ME) w/Device KIT 1 each by Does not apply route.    . carvedilol (COREG) 25 MG tablet Take 25 mg  by mouth 2 (two) times daily with a meal.    . dexamethasone (DECADRON) 4 MG tablet Take 12 mg by mouth See admin instructions. Take after chemo treatments    . glucose blood (ACCU-CHEK GUIDE) test strip Use Accu Chek test strips as instructed to check blood sugar fasting or two hours after meals, every other day. 100 each 2  . hydrALAZINE (APRESOLINE) 50 MG tablet TAKE 1 TABLET BY MOUTH THREE TIMES DAILY 270 tablet 3  . isosorbide mononitrate (IMDUR) 30 MG 24 hr tablet Take 1 tablet (30 mg total) by mouth daily. 90 tablet 2  . losartan (COZAAR) 100 MG tablet Take 1 tablet by mouth once daily 90 tablet 3  . potassium chloride SA (KLOR-CON) 20 MEQ tablet Take 1 tablet by mouth once daily 30 tablet 3  . predniSONE (DELTASONE) 20 MG tablet Take 1 tablet (20 mg total) by mouth daily as needed (arthiritis). (Patient not taking: No sig reported) 30 tablet 3  . prochlorperazine (COMPAZINE) 10 MG tablet Take 1 tablet (10 mg total) by mouth every 6 (six) hours as needed (Nausea or vomiting). 30 tablet 1  . repaglinide (PRANDIN) 1 MG tablet Take 1 tablet (1 mg total) by mouth 3 (three) times daily before meals. 90 tablet 1  . senna-docusate (SENNA S) 8.6-50 MG tablet Take 2 tablets by mouth at bedtime. 60 tablet 5   No current facility-administered medications for this visit.   Facility-Administered Medications Ordered in Other Visits  Medication Dose Route Frequency Provider Last Rate Last Admin  . sodium chloride flush (NS) 0.9 % injection 10 mL  10  mL Intravenous PRN Brunetta Genera, MD      . sodium chloride flush (NS) 0.9 % injection 10 mL  10 mL Intracatheter Once PRN Brunetta Genera, MD        REVIEW OF SYSTEMS:  10 Point review of Systems was done is negative except as noted above.  PHYSICAL EXAMINATION: ECOG PERFORMANCE STATUS: 0 - Asymptomatic  .BP (!) 102/59 (BP Location: Left Arm, Patient Position: Sitting)   Pulse 84   Temp 98.1 F (36.7 C) (Tympanic)   Resp 20   Ht $R'5\' 9"'RF$  (1.753 m)   Wt 153 lb 11.2 oz (69.7 kg)   SpO2 100%   BMI 22.70 kg/m  Exam was given in a chair.   GENERAL:alert, in no acute distress and comfortable SKIN: no acute rashes, no significant lesions EYES: conjunctiva are pink and non-injected, sclera anicteric OROPHARYNX: MMM, no exudates, no oropharyngeal erythema or ulceration NECK: supple, no JVD LYMPH:  no palpable lymphadenopathy in the cervical, axillary or inguinal regions LUNGS: clear to auscultation b/l with normal respiratory effort Welch: regular rate & rhythm ABDOMEN:  normoactive bowel sounds , non tender, not distended. Extremity: no pedal edema PSYCH: alert & oriented x 3 with fluent speech NEURO: no focal motor/sensory deficits  LABORATORY DATA:  I have reviewed the data as listed  . CBC Latest Ref Rng & Units 08/12/2020 07/22/2020 07/15/2020  WBC 4.0 - 10.5 K/uL 5.8 8.3 6.0  Hemoglobin 13.0 - 17.0 g/dL 8.6(L) 8.7(L) 8.4(L)  Hematocrit 39.0 - 52.0 % 27.9(L) 27.6(L) 26.0(L)  Platelets 150 - 400 K/uL 348 274 403(H)    . CMP Latest Ref Rng & Units 08/12/2020 07/22/2020 07/15/2020  Glucose 70 - 99 mg/dL 243(H) 165(H) 183(H)  BUN 8 - 23 mg/dL 24(H) 21 18  Creatinine 0.61 - 1.24 mg/dL 2.43(H) 2.09(H) 2.12(H)  Sodium 135 - 145 mmol/L 138 139 138  Potassium 3.5 - 5.1 mmol/L 3.5 3.8 3.8  Chloride 98 - 111 mmol/L 109 109 109  CO2 22 - 32 mmol/L 18(L) 19(L) 20(L)  Calcium 8.9 - 10.3 mg/dL 9.1 9.0 9.2  Total Protein 6.5 - 8.1 g/dL 6.8 6.7 6.7  Total Bilirubin 0.3 - 1.2  mg/dL 0.4 0.6 0.5  Alkaline Phos 38 - 126 U/L 70 77 73  AST 15 - 41 U/L <6(L) <6(L) <6(L)  ALT 0 - 44 U/L <6 <6 6   10/24/2019 FISH Panel:     10/24/2019 BM Bx Surgical Pathology:    RADIOGRAPHIC STUDIES: I have personally reviewed the radiological images as listed and agreed with the findings in the report. XR Hand 2 View Left  Result Date: 07/19/2020 Juxta-articular osteopenia was noted.  Narrowing of intercarpal, first second and third MCP joints was noted.  Cystic changes were noted in the MCPs and carpal bones.  Severe PIP and DIP narrowing was noted. Impression: These findings are consistent with inflammatory arthritis of the hand most likely rheumatoid arthritis.   ASSESSMENT & PLAN:    65 yo with   1) High risk Multiple myeloma -09/27/19 M Protein at 3.4 -10/25/2019 PET/CT ([id number]) revealed "1. No specific myelomatous lesions are identified. 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  2) Anemia ? Related to CKD vs ? Plasma cell dyscrasia 3) CKD ?etiology - HTN ? Cardiac issues ? Myeloma related. 4) recently diagnosed small Pulmonary embolism  PLAN: -Discussed pt labwork today, 08/12/2020; Hgb holding stable, chemistries stable for most part. -Advised pt his m-protein is down to 0.2. Discussed treatment options once maximum response is achieved-- maintenance versus aggressive treatment. The pt  wishes to go on maintenance therapy and avoid a transplant. -The pt has no prohibitive toxicities from continuing Daratumumab/Carfilzomib/Dexamethasone at this time.  -Continue renally adjusted dose of Eliquis and hold Aspirin. -Will get Ferritin labs today. IV iron if levels are low.  -Will see back in 4 weeks with labs and myeloma labs with C11D15.   FOLLOW UP: Plz schedule next cycle 11 of Carfilzomib/Daratumumab Labs on D1,8 and 15 of each cycle Next MD visit with C11D15 of next cycle of treatment in 4 weeks    The total time spent in the appointment was 30 minutes and more than 50% was on counseling and direct patient cares.  All of the patient's questions were answered with apparent satisfaction. The patient knows to call the clinic with any problems, questions or concerns.    Sullivan Lone MD [place] AAHIVMS [facility] Hematology/Oncology Physician [facility]  (Office):       [phone] (Work cell):  [phone] (Fax):           [phone]  08/11/2020 9:40 AM  I, Reinaldo Raddle, am acting as scribe for Dr. Sullivan Lone, MD.   .I have reviewed the above documentation for accuracy and completeness, and I agree with the above. Brunetta Genera MD

## 2020-08-12 ENCOUNTER — Other Ambulatory Visit: Payer: Self-pay

## 2020-08-12 ENCOUNTER — Inpatient Hospital Stay: Payer: Medicare Other

## 2020-08-12 ENCOUNTER — Inpatient Hospital Stay (HOSPITAL_BASED_OUTPATIENT_CLINIC_OR_DEPARTMENT_OTHER): Payer: Medicare Other | Admitting: Hematology

## 2020-08-12 ENCOUNTER — Inpatient Hospital Stay: Payer: Medicare Other | Admitting: Nutrition

## 2020-08-12 ENCOUNTER — Other Ambulatory Visit: Payer: Self-pay | Admitting: Hematology

## 2020-08-12 VITALS — BP 102/59 | HR 84 | Temp 98.1°F | Resp 20 | Ht 69.0 in | Wt 153.7 lb

## 2020-08-12 VITALS — BP 130/56 | HR 74 | Resp 18

## 2020-08-12 DIAGNOSIS — C9000 Multiple myeloma not having achieved remission: Secondary | ICD-10-CM

## 2020-08-12 DIAGNOSIS — I2510 Atherosclerotic heart disease of native coronary artery without angina pectoris: Secondary | ICD-10-CM | POA: Diagnosis not present

## 2020-08-12 DIAGNOSIS — Z5112 Encounter for antineoplastic immunotherapy: Secondary | ICD-10-CM | POA: Diagnosis not present

## 2020-08-12 DIAGNOSIS — Z5111 Encounter for antineoplastic chemotherapy: Secondary | ICD-10-CM | POA: Diagnosis not present

## 2020-08-12 DIAGNOSIS — Z7189 Other specified counseling: Secondary | ICD-10-CM

## 2020-08-12 LAB — CBC WITH DIFFERENTIAL/PLATELET
Abs Immature Granulocytes: 0.04 10*3/uL (ref 0.00–0.07)
Basophils Absolute: 0 10*3/uL (ref 0.0–0.1)
Basophils Relative: 1 %
Eosinophils Absolute: 0.1 10*3/uL (ref 0.0–0.5)
Eosinophils Relative: 2 %
HCT: 27.9 % — ABNORMAL LOW (ref 39.0–52.0)
Hemoglobin: 8.6 g/dL — ABNORMAL LOW (ref 13.0–17.0)
Immature Granulocytes: 1 %
Lymphocytes Relative: 21 %
Lymphs Abs: 1.2 10*3/uL (ref 0.7–4.0)
MCH: 25.5 pg — ABNORMAL LOW (ref 26.0–34.0)
MCHC: 30.8 g/dL (ref 30.0–36.0)
MCV: 82.8 fL (ref 80.0–100.0)
Monocytes Absolute: 0.6 10*3/uL (ref 0.1–1.0)
Monocytes Relative: 10 %
Neutro Abs: 3.8 10*3/uL (ref 1.7–7.7)
Neutrophils Relative %: 65 %
Platelets: 348 10*3/uL (ref 150–400)
RBC: 3.37 MIL/uL — ABNORMAL LOW (ref 4.22–5.81)
RDW: 18 % — ABNORMAL HIGH (ref 11.5–15.5)
WBC: 5.8 10*3/uL (ref 4.0–10.5)
nRBC: 0 % (ref 0.0–0.2)

## 2020-08-12 LAB — CMP (CANCER CENTER ONLY)
ALT: <6 U/L (ref 0–44)
AST: <6 U/L — ABNORMAL LOW (ref 15–41)
Albumin: 2.8 g/dL — ABNORMAL LOW (ref 3.5–5.0)
Alkaline Phosphatase: 70 U/L (ref 38–126)
Anion gap: 11 (ref 5–15)
BUN: 24 mg/dL — ABNORMAL HIGH (ref 8–23)
CO2: 18 mmol/L — ABNORMAL LOW (ref 22–32)
Calcium: 9.1 mg/dL (ref 8.9–10.3)
Chloride: 109 mmol/L (ref 98–111)
Creatinine: 2.43 mg/dL — ABNORMAL HIGH (ref 0.61–1.24)
GFR, Estimated: 29 mL/min — ABNORMAL LOW (ref >60)
Glucose, Bld: 243 mg/dL — ABNORMAL HIGH (ref 70–99)
Potassium: 3.5 mmol/L (ref 3.5–5.1)
Sodium: 138 mmol/L (ref 135–145)
Total Bilirubin: 0.4 mg/dL (ref 0.3–1.2)
Total Protein: 6.8 g/dL (ref 6.5–8.1)

## 2020-08-12 MED ORDER — MONTELUKAST SODIUM 10 MG PO TABS
ORAL_TABLET | ORAL | Status: AC
  Filled 2020-08-12: qty 1

## 2020-08-12 MED ORDER — SODIUM CHLORIDE 0.9 % IV SOLN
Freq: Once | INTRAVENOUS | Status: AC
  Filled 2020-08-12: qty 250

## 2020-08-12 MED ORDER — SODIUM CHLORIDE 0.9 % IV SOLN
1100.0000 mg | Freq: Once | INTRAVENOUS | Status: AC
  Administered 2020-08-12: 1100 mg via INTRAVENOUS
  Filled 2020-08-12: qty 15

## 2020-08-12 MED ORDER — METHYLPREDNISOLONE SODIUM SUCC 125 MG IJ SOLR
INTRAMUSCULAR | Status: AC
  Filled 2020-08-12: qty 2

## 2020-08-12 MED ORDER — MEPERIDINE HCL 25 MG/ML IJ SOLN
25.0000 mg | Freq: Once | INTRAMUSCULAR | Status: AC
  Administered 2020-08-12: 25 mg via INTRAVENOUS

## 2020-08-12 MED ORDER — DEXTROSE 5 % IV SOLN
36.0000 mg/m2 | Freq: Once | INTRAVENOUS | Status: AC
  Administered 2020-08-12: 70 mg via INTRAVENOUS
  Filled 2020-08-12: qty 30

## 2020-08-12 MED ORDER — ACETAMINOPHEN 325 MG PO TABS
650.0000 mg | ORAL_TABLET | Freq: Once | ORAL | Status: AC
  Administered 2020-08-12: 650 mg via ORAL

## 2020-08-12 MED ORDER — MEPERIDINE HCL 25 MG/ML IJ SOLN
INTRAMUSCULAR | Status: AC
  Filled 2020-08-12: qty 1

## 2020-08-12 MED ORDER — ACETAMINOPHEN 325 MG PO TABS
ORAL_TABLET | ORAL | Status: AC
  Filled 2020-08-12: qty 2

## 2020-08-12 MED ORDER — METHYLPREDNISOLONE SODIUM SUCC 125 MG IJ SOLR
100.0000 mg | Freq: Once | INTRAMUSCULAR | Status: AC
  Administered 2020-08-12: 100 mg via INTRAVENOUS

## 2020-08-12 MED ORDER — MONTELUKAST SODIUM 10 MG PO TABS
10.0000 mg | ORAL_TABLET | Freq: Once | ORAL | Status: AC
  Administered 2020-08-12: 10 mg via ORAL

## 2020-08-12 MED ORDER — DIPHENHYDRAMINE HCL 25 MG PO CAPS
50.0000 mg | ORAL_CAPSULE | Freq: Once | ORAL | Status: AC
  Administered 2020-08-12: 50 mg via ORAL

## 2020-08-12 MED ORDER — FAMOTIDINE 200 MG/20ML IV SOLN
40.0000 mg | Freq: Once | INTRAVENOUS | Status: AC
  Administered 2020-08-12: 40 mg via INTRAVENOUS
  Filled 2020-08-12: qty 4

## 2020-08-12 MED ORDER — DIPHENHYDRAMINE HCL 25 MG PO CAPS
ORAL_CAPSULE | ORAL | Status: AC
  Filled 2020-08-12: qty 2

## 2020-08-12 NOTE — Progress Notes (Signed)
Office Visit Note  Patient: [name]             Date of Birth: 09/06/1955           MRN: [id number]             PCP: Isaac Bliss, Rayford Halsted, MD Referring: Isaac Bliss, [place]* Visit Date: 08/15/2020 Occupation: @GUAROCC @  Subjective:  Rheumatoid arthritis.   History of Present Illness: [name] is a 65 y.o. male with a history of severe rheumatoid arthritis with nodulosis. He returns today for follow-up visit. He states that he had one episode of increased pain and swelling in his right hand which responded to the prednisone tablet. He states now he is doing well. He has been getting infusions for multiple myeloma at the hospital. He states he is not interested in any long-term treatment for rheumatoid arthritis.  Activities of Daily Living:  Patient reports morning stiffness for 0 minutes.   Patient Denies nocturnal pain.  Difficulty dressing/grooming: Denies Difficulty climbing stairs: Reports Difficulty getting out of chair: Reports Difficulty using hands for taps, buttons, cutlery, and/or writing: Denies  Review of Systems  Constitutional: Negative for fatigue.  HENT: Negative for mouth sores, mouth dryness and nose dryness.   Eyes: Negative for pain, itching and dryness.  Respiratory: Negative for shortness of breath and difficulty breathing.   Cardiovascular: Negative for chest pain and palpitations.  Gastrointestinal: Positive for constipation and diarrhea. Negative for blood in stool.  Endocrine: Negative for increased urination.  Genitourinary: Positive for difficulty urinating.  Musculoskeletal: Positive for arthralgias, joint pain and joint swelling. Negative for myalgias, morning stiffness, muscle tenderness and myalgias.  Skin: Negative for color change, rash and redness.  Allergic/Immunologic: Negative for susceptible to infections.  Neurological: Negative for dizziness, numbness, headaches, memory loss and weakness.  Hematological:  Negative for bruising/bleeding tendency.  Psychiatric/Behavioral: Negative for confusion.    PMFS History:  Patient Active Problem List   Diagnosis Date Noted  . Flu vaccine need 06/04/2020  . Iron deficiency anemia 06/04/2020  . Rheumatoid arthritis involving multiple sites ([place]) 04/17/2020  . Aspiration pneumonia ([place]) 03/13/2020  . Severe sepsis ([place]) 03/13/2020  . Pulmonary embolism ([place]) 03/13/2020  . AKI (acute kidney injury) ([place]) 03/13/2020  . Anemia 03/13/2020  . Multiple myeloma ([place]) 03/13/2020  . Hypertension 03/13/2020  . Acute hypoxemic respiratory failure ([place]) 03/13/2020  . Multiple myeloma ([place]) 11/06/2019  . Counseling regarding advance care planning and goals of care 11/06/2019  . Uncontrolled type 2 diabetes mellitus with hyperglycemia ([place]) 09/19/2018  . Acute on chronic combined systolic and diastolic CHF (congestive heart failure) ([place]) 11/26/2017  . Hyperlipemia 01/07/2011  . CAD (coronary artery disease)   . Dilated cardiomyopathy ([place])   . HTN (hypertension)   . PAD (peripheral artery disease) ([place])   . Chronic renal insufficiency     Past Medical History:  Diagnosis Date  . Anemia   . Arthritis    "left knee" (11/24/2017)  . CAD (coronary artery disease)    CABG 2002  . Cancer [facility])    Multiple Myeloma  . CHF (congestive heart failure) ([place])   . Chronic kidney disease   . Chronic renal insufficiency   . Diabetes mellitus without complication ([place])   . Dilated cardiomyopathy ([place])    echo in 2009 showed improvement with a normal EF  . HTN (hypertension)   . Hyperlipemia   . Hypertension   . Noncompliance   . PAD (peripheral artery disease) ([place])   .  Pneumonia     Family History  Problem Relation Age of Onset  . Hypertension Father   . Diabetes Mother        DIABETIC COMA  . Healthy Daughter    Past Surgical History:  Procedure Laterality Date  . CARDIAC CATHETERIZATION  2002, 2006  . CORONARY ARTERY BYPASS GRAFT  2002   x4 DR. VAN  TRIGT  . IR FLUORO GUIDED NEEDLE PLC ASPIRATION/INJECTION LOC  10/24/2019   Social History   Social History Narrative   ** Merged History Encounter **       Immunization History  Administered Date(s) Administered  . Influenza,inj,Quad PF,6+ Mos 06/04/2020  . PFIZER(Purple Top)SARS-COV-2 Vaccination 10/19/2019, 11/13/2019, 04/30/2020  . Pneumococcal Polysaccharide-23 07/25/2020  . Tdap 10/04/2019     Objective: Vital Signs: BP 111/68 (BP Location: Left Arm, Patient Position: Sitting, Cuff Size: Normal)   Pulse 88   Resp 16   Ht $R'5\' 9"'HI$  (1.753 m)   Wt 154 lb 12.8 oz (70.2 kg)   BMI 22.86 kg/m    Physical Exam Vitals and nursing note reviewed.  Constitutional:      Appearance: He is well-developed and well-nourished.  HENT:     Head: Normocephalic and atraumatic.  Eyes:     Extraocular Movements: EOM normal.     Conjunctiva/sclera: Conjunctivae normal.     Pupils: Pupils are equal, round, and reactive to light.  Cardiovascular:     Rate and Rhythm: Normal rate and regular rhythm.     Heart sounds: Normal heart sounds.  Pulmonary:     Effort: Pulmonary effort is normal.     Breath sounds: Normal breath sounds.  Abdominal:     General: Bowel sounds are normal.     Palpations: Abdomen is soft.  Musculoskeletal:     Cervical back: Normal range of motion and neck supple.  Skin:    General: Skin is warm and dry.     Capillary Refill: Capillary refill takes less than 2 seconds.     Comments: Rheumatoid nodules are present over bilateral elbows.  Neurological:     Mental Status: He is alert and oriented to person, place, and time.  Psychiatric:        Mood and Affect: Mood and affect normal.        Behavior: Behavior normal.      Musculoskeletal Exam: He had limited range of motion of the cervical spine. He had bilateral hip joint contractures and nodulosis. He had limited range of motion of bilateral wrist joints. He has synovial thickening over bilateral wrist joints  and MCP joints. He had incomplete fist formation and incomplete extension of his fingers due to severe rheumatoid arthritis and osteoarthritis overlap. Knee joints with good range of motion. He had no tenderness over ankles or MTP joints.  CDAI Exam: CDAI Score: -- Patient Global: --; Provider Global: -- Swollen: --; Tender: -- Joint Exam 08/15/2020   No joint exam has been documented for this visit   There is currently no information documented on the homunculus. Go to the Rheumatology activity and complete the homunculus joint exam.  Investigation: No additional findings.  Imaging: XR Hand 2 View Left  Result Date: 07/19/2020 Juxta-articular osteopenia was noted.  Narrowing of intercarpal, first second and third MCP joints was noted.  Cystic changes were noted in the MCPs and carpal bones.  Severe PIP and DIP narrowing was noted. Impression: These findings are consistent with inflammatory arthritis of the hand most likely rheumatoid arthritis.   Recent  Labs: Lab Results  Component Value Date   WBC 5.8 08/12/2020   HGB 8.6 (L) 08/12/2020   PLT 348 08/12/2020   NA 138 08/12/2020   K 3.5 08/12/2020   CL 109 08/12/2020   CO2 18 (L) 08/12/2020   GLUCOSE 243 (H) 08/12/2020   BUN 24 (H) 08/12/2020   CREATININE 2.43 (H) 08/12/2020   BILITOT 0.4 08/12/2020   ALKPHOS 70 08/12/2020   AST <6 (L) 08/12/2020   ALT <6 08/12/2020   PROT 6.8 08/12/2020   ALBUMIN 2.8 (L) 08/12/2020   CALCIUM 9.1 08/12/2020   GFRAA 58 (L) 04/23/2020    Speciality Comments: No specialty comments available.  Procedures:  No procedures performed Allergies: Patient has no known allergies.   Assessment / Plan:     Visit Diagnoses: Rheumatoid arthritis involving multiple sites with positive rheumatoid factor (HCC) - RF 22, anti-CCP negative, severe erosive disease noted on the x-rays.  He had no synovitis on the examination.  Nodulosis noted over bilateral elbows. He states he had a flare about a week ago  which responded to 1 tablet of prednisone. We had detailed discussion regarding immunosuppressive therapy but he does not want any treatment. He states he is already taking treatment for multiple myeloma which is difficult for him to manage. He stated that he will return for follow-up visit if needed.  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  His RA is burned-out.  She declined treatment at the last visit. Declined treatment today as well.  History of multiple myeloma - Recent diagnosis. He is on chemotherapy now.  Anemia, unspecified type - Due to multiple myeloma.  Single subsegmental pulmonary embolism without acute cor pulmonale (HCC)  PAD (peripheral artery disease) ([place])  Primary hypertension  Dilated cardiomyopathy (HCC)  Coronary artery disease involving native coronary artery of native heart without angina pectoris  History of hyperlipidemia  Acute hypoxemic respiratory failure ([place])  Uncontrolled type 2 diabetes mellitus with hyperglycemia (HCC)  Chronic renal impairment, stage 3a (HCC)  Severe sepsis ([place])  Orders: No orders of the defined types were placed in this encounter.  No orders of the defined types were placed in this encounter.    Follow-Up Instructions: Return if symptoms worsen or fail to improve, for Rheumatoid arthritis.   Bo Merino, MD  Note - This record has been created using Editor, commissioning.  Chart creation errors have been sought, but may not always  have been located. Such creation errors do not reflect on  the standard of medical care.

## 2020-08-12 NOTE — Progress Notes (Signed)
Nutrition follow-up completed with patient receiving treatment for multiple myeloma.  He is followed by Dr. Irene Limbo. Patient reports he is eating better. His appetite is improving. He continues to drink boost between meals. Weight documented as 153.7 pounds January 31 down from 162 pounds on December 21.  Nutrition diagnosis: Inadequate oral intake improving.  Intervention: Enforced importance of smaller more frequent meals and snacks using high-calorie, high-protein foods. Encouraged bowel regimen with adequate fluid intake. Oral nutrition supplements as tolerated.  Provided coupons. Gave patient name and phone number for questions.  Monitoring, evaluation, goals: We will monitor weights along with oral intake to minimize weight loss.  Next visit: To be scheduled as needed.  **Disclaimer: This note was dictated with voice recognition software. Similar sounding words can inadvertently be transcribed and this note may contain transcription errors which may not have been corrected upon publication of note.**

## 2020-08-12 NOTE — Progress Notes (Signed)
Ok to decrease Darzalex based on wt loss.  [name] [place], [place], BCPS, BCOP 08/12/2020 11:35 AM.

## 2020-08-12 NOTE — Patient Instructions (Signed)
[facility] Discharge Instructions for Patients Receiving Chemotherapy  Today you received the following chemotherapy agents: Daratumumab and Kyprolis  To help prevent nausea and vomiting after your treatment, we encourage you to take your nausea medication  as prescribed.    If you develop nausea and vomiting that is not controlled by your nausea medication, call the clinic.   BELOW ARE SYMPTOMS THAT SHOULD BE REPORTED IMMEDIATELY:  *FEVER GREATER THAN 100.5 F  *CHILLS WITH OR WITHOUT FEVER  NAUSEA AND VOMITING THAT IS NOT CONTROLLED WITH YOUR NAUSEA MEDICATION  *UNUSUAL SHORTNESS OF BREATH  *UNUSUAL BRUISING OR BLEEDING  TENDERNESS IN MOUTH AND THROAT WITH OR WITHOUT PRESENCE OF ULCERS  *URINARY PROBLEMS  *BOWEL PROBLEMS  UNUSUAL RASH Items with * indicate a potential emergency and should be followed up as soon as possible.  Feel free to call the clinic should you have any questions or concerns. The clinic phone number is [phone].  Please show the [facility] at check-in to the Emergency Department and triage nurse.

## 2020-08-12 NOTE — Progress Notes (Signed)
Per Dr. Irene Limbo - okay to treat with elevated creatinine levels.

## 2020-08-15 ENCOUNTER — Encounter: Payer: Self-pay | Admitting: Rheumatology

## 2020-08-15 ENCOUNTER — Other Ambulatory Visit: Payer: Self-pay

## 2020-08-15 ENCOUNTER — Ambulatory Visit (INDEPENDENT_AMBULATORY_CARE_PROVIDER_SITE_OTHER): Payer: Medicare Other | Admitting: Rheumatology

## 2020-08-15 VITALS — BP 111/68 | HR 88 | Resp 16 | Ht 69.0 in | Wt 154.8 lb

## 2020-08-15 DIAGNOSIS — Z8579 Personal history of other malignant neoplasms of lymphoid, hematopoietic and related tissues: Secondary | ICD-10-CM

## 2020-08-15 DIAGNOSIS — D649 Anemia, unspecified: Secondary | ICD-10-CM

## 2020-08-15 DIAGNOSIS — M79641 Pain in right hand: Secondary | ICD-10-CM

## 2020-08-15 DIAGNOSIS — M79642 Pain in left hand: Secondary | ICD-10-CM

## 2020-08-15 DIAGNOSIS — Z8639 Personal history of other endocrine, nutritional and metabolic disease: Secondary | ICD-10-CM

## 2020-08-15 DIAGNOSIS — M0579 Rheumatoid arthritis with rheumatoid factor of multiple sites without organ or systems involvement: Secondary | ICD-10-CM

## 2020-08-15 DIAGNOSIS — N1831 Chronic kidney disease, stage 3a: Secondary | ICD-10-CM

## 2020-08-15 DIAGNOSIS — I420 Dilated cardiomyopathy: Secondary | ICD-10-CM

## 2020-08-15 DIAGNOSIS — A419 Sepsis, unspecified organism: Secondary | ICD-10-CM

## 2020-08-15 DIAGNOSIS — I2510 Atherosclerotic heart disease of native coronary artery without angina pectoris: Secondary | ICD-10-CM

## 2020-08-15 DIAGNOSIS — E1165 Type 2 diabetes mellitus with hyperglycemia: Secondary | ICD-10-CM

## 2020-08-15 DIAGNOSIS — I739 Peripheral vascular disease, unspecified: Secondary | ICD-10-CM

## 2020-08-15 DIAGNOSIS — J9601 Acute respiratory failure with hypoxia: Secondary | ICD-10-CM

## 2020-08-15 DIAGNOSIS — I2693 Single subsegmental pulmonary embolism without acute cor pulmonale: Secondary | ICD-10-CM

## 2020-08-15 DIAGNOSIS — I10 Essential (primary) hypertension: Secondary | ICD-10-CM

## 2020-08-15 DIAGNOSIS — N2889 Other specified disorders of kidney and ureter: Secondary | ICD-10-CM

## 2020-08-15 DIAGNOSIS — R6520 Severe sepsis without septic shock: Secondary | ICD-10-CM

## 2020-08-16 ENCOUNTER — Telehealth: Payer: Self-pay | Admitting: *Deleted

## 2020-08-16 NOTE — Telephone Encounter (Signed)
[name], RpH conferred with Dr. Irene Limbo - Patient does not need to come on Monday 2/7 for labs and Kyprolis infusion. Next Kyprolis infusion is 2/14 (scheduled) .  Contacted patient. Left this information on named voice mail and informed patient that appts will be cancelled. Appointments cancelled. Encouraged patient to contact office Monday 2/7 am if questions.

## 2020-08-19 ENCOUNTER — Inpatient Hospital Stay: Payer: Medicare Other

## 2020-08-26 ENCOUNTER — Other Ambulatory Visit: Payer: Self-pay

## 2020-08-26 ENCOUNTER — Inpatient Hospital Stay: Payer: Medicare Other | Attending: Hematology

## 2020-08-26 ENCOUNTER — Inpatient Hospital Stay: Payer: Medicare Other

## 2020-08-26 ENCOUNTER — Other Ambulatory Visit: Payer: Self-pay | Admitting: Hematology

## 2020-08-26 VITALS — BP 126/62 | HR 66 | Temp 97.7°F | Resp 18 | Wt 156.2 lb

## 2020-08-26 DIAGNOSIS — Z7189 Other specified counseling: Secondary | ICD-10-CM

## 2020-08-26 DIAGNOSIS — C9000 Multiple myeloma not having achieved remission: Secondary | ICD-10-CM

## 2020-08-26 DIAGNOSIS — Z5111 Encounter for antineoplastic chemotherapy: Secondary | ICD-10-CM | POA: Diagnosis present

## 2020-08-26 DIAGNOSIS — Z5112 Encounter for antineoplastic immunotherapy: Secondary | ICD-10-CM | POA: Diagnosis present

## 2020-08-26 LAB — CMP (CANCER CENTER ONLY)
ALT: <6 U/L (ref 0–44)
AST: <6 U/L — ABNORMAL LOW (ref 15–41)
Albumin: 3.1 g/dL — ABNORMAL LOW (ref 3.5–5.0)
Alkaline Phosphatase: 82 U/L (ref 38–126)
Anion gap: 10 (ref 5–15)
BUN: 21 mg/dL (ref 8–23)
CO2: 19 mmol/L — ABNORMAL LOW (ref 22–32)
Calcium: 9.3 mg/dL (ref 8.9–10.3)
Chloride: 111 mmol/L (ref 98–111)
Creatinine: 2.36 mg/dL — ABNORMAL HIGH (ref 0.61–1.24)
GFR, Estimated: 30 mL/min — ABNORMAL LOW (ref >60)
Glucose, Bld: 243 mg/dL — ABNORMAL HIGH (ref 70–99)
Potassium: 3.5 mmol/L (ref 3.5–5.1)
Sodium: 140 mmol/L (ref 135–145)
Total Bilirubin: 0.4 mg/dL (ref 0.3–1.2)
Total Protein: 6.9 g/dL (ref 6.5–8.1)

## 2020-08-26 LAB — CBC WITH DIFFERENTIAL/PLATELET
Abs Immature Granulocytes: 0.04 10*3/uL (ref 0.00–0.07)
Basophils Absolute: 0.1 10*3/uL (ref 0.0–0.1)
Basophils Relative: 1 %
Eosinophils Absolute: 0.1 10*3/uL (ref 0.0–0.5)
Eosinophils Relative: 2 %
HCT: 27.7 % — ABNORMAL LOW (ref 39.0–52.0)
Hemoglobin: 8.6 g/dL — ABNORMAL LOW (ref 13.0–17.0)
Immature Granulocytes: 1 %
Lymphocytes Relative: 17 %
Lymphs Abs: 1.2 10*3/uL (ref 0.7–4.0)
MCH: 26.5 pg (ref 26.0–34.0)
MCHC: 31 g/dL (ref 30.0–36.0)
MCV: 85.2 fL (ref 80.0–100.0)
Monocytes Absolute: 0.4 10*3/uL (ref 0.1–1.0)
Monocytes Relative: 6 %
Neutro Abs: 5.4 10*3/uL (ref 1.7–7.7)
Neutrophils Relative %: 73 %
Platelets: 346 10*3/uL (ref 150–400)
RBC: 3.25 MIL/uL — ABNORMAL LOW (ref 4.22–5.81)
RDW: 19 % — ABNORMAL HIGH (ref 11.5–15.5)
WBC: 7.2 10*3/uL (ref 4.0–10.5)
nRBC: 0 % (ref 0.0–0.2)

## 2020-08-26 MED ORDER — SODIUM CHLORIDE 0.9 % IV SOLN
40.0000 mg | Freq: Once | INTRAVENOUS | Status: AC
  Administered 2020-08-26: 40 mg via INTRAVENOUS
  Filled 2020-08-26: qty 4

## 2020-08-26 MED ORDER — METHYLPREDNISOLONE SODIUM SUCC 125 MG IJ SOLR
100.0000 mg | Freq: Once | INTRAMUSCULAR | Status: AC
Start: 2020-08-26 — End: 2020-08-26
  Administered 2020-08-26: 100 mg via INTRAVENOUS

## 2020-08-26 MED ORDER — DIPHENHYDRAMINE HCL 25 MG PO CAPS
50.0000 mg | ORAL_CAPSULE | Freq: Once | ORAL | Status: AC
  Administered 2020-08-26: 50 mg via ORAL

## 2020-08-26 MED ORDER — MEPERIDINE HCL 25 MG/ML IJ SOLN
INTRAMUSCULAR | Status: AC
  Filled 2020-08-26: qty 1

## 2020-08-26 MED ORDER — ACETAMINOPHEN 325 MG PO TABS
ORAL_TABLET | ORAL | Status: AC
  Filled 2020-08-26: qty 2

## 2020-08-26 MED ORDER — ACETAMINOPHEN 325 MG PO TABS
650.0000 mg | ORAL_TABLET | Freq: Once | ORAL | Status: AC
  Administered 2020-08-26: 650 mg via ORAL

## 2020-08-26 MED ORDER — MEPERIDINE HCL 25 MG/ML IJ SOLN
25.0000 mg | Freq: Once | INTRAMUSCULAR | Status: AC
  Administered 2020-08-26: 25 mg via INTRAVENOUS

## 2020-08-26 MED ORDER — SODIUM CHLORIDE 0.9 % IV SOLN
16.0000 mg/kg | Freq: Once | INTRAVENOUS | Status: AC
  Administered 2020-08-26: 1100 mg via INTRAVENOUS
  Filled 2020-08-26: qty 40

## 2020-08-26 MED ORDER — METHYLPREDNISOLONE SODIUM SUCC 125 MG IJ SOLR
INTRAMUSCULAR | Status: AC
  Filled 2020-08-26: qty 2

## 2020-08-26 MED ORDER — DIPHENHYDRAMINE HCL 25 MG PO CAPS
ORAL_CAPSULE | ORAL | Status: AC
  Filled 2020-08-26: qty 2

## 2020-08-26 MED ORDER — DEXTROSE 5 % IV SOLN
36.0000 mg/m2 | Freq: Once | INTRAVENOUS | Status: AC
  Administered 2020-08-26: 70 mg via INTRAVENOUS
  Filled 2020-08-26: qty 30

## 2020-08-26 MED ORDER — MONTELUKAST SODIUM 10 MG PO TABS
ORAL_TABLET | ORAL | Status: AC
  Filled 2020-08-26: qty 1

## 2020-08-26 MED ORDER — SODIUM CHLORIDE 0.9 % IV SOLN
Freq: Once | INTRAVENOUS | Status: AC
  Filled 2020-08-26: qty 250

## 2020-08-26 MED ORDER — MONTELUKAST SODIUM 10 MG PO TABS
10.0000 mg | ORAL_TABLET | Freq: Once | ORAL | Status: AC
  Administered 2020-08-26: 10 mg via ORAL

## 2020-08-26 NOTE — Progress Notes (Signed)
Per Dr. Kale, ok to treat with elevated creatinine.  

## 2020-08-26 NOTE — Patient Instructions (Signed)
[facility] Discharge Instructions for Patients Receiving Chemotherapy  Today you received the following chemotherapy agents: carfilzomib and daratumumab.  To help prevent nausea and vomiting after your treatment, we encourage you to take your nausea medication as directed.   If you develop nausea and vomiting that is not controlled by your nausea medication, call the clinic.   BELOW ARE SYMPTOMS THAT SHOULD BE REPORTED IMMEDIATELY:  *FEVER GREATER THAN 100.5 F  *CHILLS WITH OR WITHOUT FEVER  NAUSEA AND VOMITING THAT IS NOT CONTROLLED WITH YOUR NAUSEA MEDICATION  *UNUSUAL SHORTNESS OF BREATH  *UNUSUAL BRUISING OR BLEEDING  TENDERNESS IN MOUTH AND THROAT WITH OR WITHOUT PRESENCE OF ULCERS  *URINARY PROBLEMS  *BOWEL PROBLEMS  UNUSUAL RASH Items with * indicate a potential emergency and should be followed up as soon as possible.  Feel free to call the clinic should you have any questions or concerns. The clinic phone number is [phone].  Please show the [facility] at check-in to the Emergency Department and triage nurse.

## 2020-08-26 NOTE — Progress Notes (Signed)
Ok to treat with Scr 2.36 today per Dr. Irene Limbo.  Raul [place], [place], BCPS, BCOP 08/26/2020 10:41 AM

## 2020-08-28 LAB — MULTIPLE MYELOMA PANEL, SERUM
Albumin SerPl Elph-Mcnc: 3.2 g/dL (ref 2.9–4.4)
Albumin/Glob SerPl: 1.2 (ref 0.7–1.7)
Alpha 1: 0.3 g/dL (ref 0.0–0.4)
Alpha2 Glob SerPl Elph-Mcnc: 1 g/dL (ref 0.4–1.0)
B-Globulin SerPl Elph-Mcnc: 0.9 g/dL (ref 0.7–1.3)
Gamma Glob SerPl Elph-Mcnc: 0.8 g/dL (ref 0.4–1.8)
Globulin, Total: 2.9 g/dL (ref 2.2–3.9)
IgA: 72 mg/dL (ref 61–437)
IgG (Immunoglobin G), Serum: 942 mg/dL (ref 603–1613)
IgM (Immunoglobulin M), Srm: 26 mg/dL (ref 20–172)
Total Protein ELP: 6.1 g/dL (ref 6.0–8.5)

## 2020-08-30 ENCOUNTER — Other Ambulatory Visit: Payer: Self-pay | Admitting: Endocrinology

## 2020-09-02 ENCOUNTER — Other Ambulatory Visit: Payer: Self-pay

## 2020-09-02 ENCOUNTER — Inpatient Hospital Stay: Payer: Medicare Other

## 2020-09-02 VITALS — BP 117/45 | HR 85 | Temp 98.1°F | Resp 17 | Wt 153.5 lb

## 2020-09-02 DIAGNOSIS — Z7189 Other specified counseling: Secondary | ICD-10-CM

## 2020-09-02 DIAGNOSIS — Z5112 Encounter for antineoplastic immunotherapy: Secondary | ICD-10-CM

## 2020-09-02 DIAGNOSIS — C9000 Multiple myeloma not having achieved remission: Secondary | ICD-10-CM

## 2020-09-02 DIAGNOSIS — Z5111 Encounter for antineoplastic chemotherapy: Secondary | ICD-10-CM | POA: Diagnosis not present

## 2020-09-02 LAB — CMP (CANCER CENTER ONLY)
ALT: <6 U/L (ref 0–44)
AST: <6 U/L — ABNORMAL LOW (ref 15–41)
Albumin: 2.8 g/dL — ABNORMAL LOW (ref 3.5–5.0)
Alkaline Phosphatase: 69 U/L (ref 38–126)
Anion gap: 12 (ref 5–15)
BUN: 25 mg/dL — ABNORMAL HIGH (ref 8–23)
CO2: 17 mmol/L — ABNORMAL LOW (ref 22–32)
Calcium: 8.6 mg/dL — ABNORMAL LOW (ref 8.9–10.3)
Chloride: 109 mmol/L (ref 98–111)
Creatinine: 2.5 mg/dL — ABNORMAL HIGH (ref 0.61–1.24)
GFR, Estimated: 28 mL/min — ABNORMAL LOW (ref >60)
Glucose, Bld: 147 mg/dL — ABNORMAL HIGH (ref 70–99)
Potassium: 3.2 mmol/L — ABNORMAL LOW (ref 3.5–5.1)
Sodium: 138 mmol/L (ref 135–145)
Total Bilirubin: 0.6 mg/dL (ref 0.3–1.2)
Total Protein: 6.5 g/dL (ref 6.5–8.1)

## 2020-09-02 LAB — CBC WITH DIFFERENTIAL/PLATELET
Abs Immature Granulocytes: 0.04 10*3/uL (ref 0.00–0.07)
Basophils Absolute: 0 10*3/uL (ref 0.0–0.1)
Basophils Relative: 0 %
Eosinophils Absolute: 0.1 10*3/uL (ref 0.0–0.5)
Eosinophils Relative: 1 %
HCT: 26.1 % — ABNORMAL LOW (ref 39.0–52.0)
Hemoglobin: 8.3 g/dL — ABNORMAL LOW (ref 13.0–17.0)
Immature Granulocytes: 1 %
Lymphocytes Relative: 17 %
Lymphs Abs: 1.2 10*3/uL (ref 0.7–4.0)
MCH: 26.5 pg (ref 26.0–34.0)
MCHC: 31.8 g/dL (ref 30.0–36.0)
MCV: 83.4 fL (ref 80.0–100.0)
Monocytes Absolute: 0.8 10*3/uL (ref 0.1–1.0)
Monocytes Relative: 11 %
Neutro Abs: 5.1 10*3/uL (ref 1.7–7.7)
Neutrophils Relative %: 70 %
Platelets: 272 10*3/uL (ref 150–400)
RBC: 3.13 MIL/uL — ABNORMAL LOW (ref 4.22–5.81)
RDW: 18.3 % — ABNORMAL HIGH (ref 11.5–15.5)
WBC: 7.3 10*3/uL (ref 4.0–10.5)
nRBC: 0 % (ref 0.0–0.2)

## 2020-09-02 LAB — FERRITIN: Ferritin: 780 ng/mL — ABNORMAL HIGH (ref 24–336)

## 2020-09-02 LAB — IRON AND TIBC
Iron: 20 ug/dL — ABNORMAL LOW (ref 42–163)
Saturation Ratios: 9 % — ABNORMAL LOW (ref 20–55)
TIBC: 214 ug/dL (ref 202–409)
UIBC: 194 ug/dL (ref 117–376)

## 2020-09-02 MED ORDER — METHYLPREDNISOLONE SODIUM SUCC 125 MG IJ SOLR
60.0000 mg | Freq: Once | INTRAMUSCULAR | Status: AC
  Administered 2020-09-02: 60 mg via INTRAVENOUS

## 2020-09-02 MED ORDER — ACETAMINOPHEN 325 MG PO TABS
650.0000 mg | ORAL_TABLET | Freq: Once | ORAL | Status: AC
  Administered 2020-09-02: 650 mg via ORAL

## 2020-09-02 MED ORDER — DIPHENHYDRAMINE HCL 25 MG PO CAPS
ORAL_CAPSULE | ORAL | Status: AC
  Filled 2020-09-02: qty 1

## 2020-09-02 MED ORDER — DEXTROSE 5 % IV SOLN
36.0000 mg/m2 | Freq: Once | INTRAVENOUS | Status: AC
  Administered 2020-09-02: 70 mg via INTRAVENOUS
  Filled 2020-09-02: qty 30

## 2020-09-02 MED ORDER — SODIUM CHLORIDE 0.9 % IV SOLN
Freq: Once | INTRAVENOUS | Status: AC
  Filled 2020-09-02: qty 250

## 2020-09-02 MED ORDER — ACETAMINOPHEN 325 MG PO TABS
ORAL_TABLET | ORAL | Status: AC
  Filled 2020-09-02: qty 2

## 2020-09-02 MED ORDER — DIPHENHYDRAMINE HCL 25 MG PO CAPS
25.0000 mg | ORAL_CAPSULE | Freq: Once | ORAL | Status: AC
  Administered 2020-09-02: 25 mg via ORAL

## 2020-09-02 MED ORDER — METHYLPREDNISOLONE SODIUM SUCC 125 MG IJ SOLR
INTRAMUSCULAR | Status: AC
  Filled 2020-09-02: qty 2

## 2020-09-02 NOTE — Patient Instructions (Signed)
[facility]charge Inst[facility] for Patients Receiving Chemotherapy  Today you received the following chemotherapy agents:  Kyprolis  To help prevent nausea and vomiting after your treatment, we encourage you to take your nausea medication as directed.   If you develop nausea and vomiting that is not controlled by your nausea medication, call the clinic.   BELOW ARE SYMPTOMS THAT SHOULD BE REPORTED IMMEDIATELY:  *FEVER GREATER THAN 100.5 F  *CHILLS WITH OR WITHOUT FEVER  NAUSEA AND VOMITING THAT IS NOT CONTROLLED WITH YOUR NAUSEA MEDICATION  *UNUSUAL SHORTNESS OF BREATH  *UNUSUAL BRUISING OR BLEEDING  TENDERNESS IN MOUTH AND THROAT WITH OR WITHOUT PRESENCE OF ULCERS  *URINARY PROBLEMS  *BOWEL PROBLEMS  UNUSUAL RASH Items with * indicate a potential emergency and should be followed up as soon as possible.  Feel free to call the clinic should you have any questions or concerns. The clinic phone number is [phone].  Please show the CHEMO ALERT CARD at check-in to the Emergency Department and triage nurse.   

## 2020-09-02 NOTE — Progress Notes (Signed)
Verbal order/Dr. Irene Limbo:  ok to receive D8 C 11 Kyprolis today with Creatinine 2.5

## 2020-09-08 NOTE — Progress Notes (Signed)
HEMATOLOGY/ONCOLOGY CLINIC NOTE  Date of Service: 09/08/2020  Patient Care Team: Isaac Bliss, Rayford Halsted, MD as PCP - General (Internal Medicine) Martinique, Peter M, MD as PCP - Cardiology (Cardiology) Isaac Bliss, Rayford Halsted, MD (Internal Medicine)  CHIEF COMPLAINTS/PURPOSE OF CONSULTATION:  Continued mx of myeloma  HISTORY OF PRESENTING ILLNESS:  [name] is a wonderful 65 y.o. male who has been referred to [place] by Dr Servando Snare for evaluation and management of elevated protein. Pt is accompanied today by his wife, Mrs. Mckey. The pt reports that he is doing well overall.    The pt reports he was first told that he had Diabetes two years ago. He has not previously needed to be on medications for his diabetes but has recently been started on Januvia by Dr. Elayne Snare, his Endocrinologist. His blood glucose was 149 this morning. He sees Burtis Junes - NP and Dr. Martinique for Cardiology. Pt has CAD and had to have open heart surgery 20 years ago. He also has HTN that he feels has been stable. His Cardiology team is currently managing his heart medications and recently told pt to discontinue taking Furosemide due kidney function on last labs. Dr. Martinique and Truitt Merle have been essentially functioning as his primary care, as he does not have a PCP at this time. He has an upcoming appointment with Truitt Merle in the beginning of May and Dr. [name] next Tuesday. Pt is currently using Ibuprofen a couple of times per week for his knee pain.   Pt has felt the same in the last 3-6 months and denies any new bone pain of any kinds. He is eating, functioning, and moving about as normal. He has lost about 13 lbs over the last few months. Pt does not smoke and does not drink much alcohol.    Most recent lab results (09/04/2019) of CBC, BMP and Hepatic function panel is as follows: all values are WNL except for RBC at 3.64, Hgb at 9.8, HCT at 29.8, Glucose at 129, BUN at 64, Creatinine at  2.62, GFR Est Af Am at 29, CO2 at 15, Total Protein at 10.0, Albumin at 3.6, ALP at 163, ALT at 45.    On review of systems, pt reports unexpected weight loss, left knee pain and denies new back pain, new shoulder pain, new hip pain, chest pain, SOB, leg swelling, testicular pain/swelling and any other symptoms.    On PMHx the pt reports Left Knee Arthritis, CAD, CHF, Type II Diabetes, HTN, HLD, Cardiac Catheterization, Coronary Artery Bypass Graft x4 (2002). On Social Hx the pt reports that he is a non-smoker and does not drink much alcohol.  INTERVAL HISTORY:    [name] is a wonderful 65 y.o. male is here for evaluation and management of Multiple Myeloma. The patient's last visit with [place] was on 08/12/2020. The pt reports that he is doing well overall. He is here for C11D15 Dara/Carfilzomib/Dexa.   The pt reports that he is more fatigued and he has been intermittently struggling with eating and drinking well. The pt is not drinking as much water and struggles with a decreased appetite. The pt feels cold all the time. The pt notes that he is not currently on any treatments for his arthritis by Dr. Estanislado Pandy.   Lab results today 09/09/2020 of CBC w/diff and CMP is as follows: all values are WNL except for RBC of 3.18, Hgb of 8.6, HCT of 27.2, RDW of 17.5, Abs Immature  Granulocytes of 0.11K, Potassium of 2.9, CO2 of 17, Glucose of 180, BUN of 28, Creatinine of 2.74, Calcium of 8.8, Albumin of 2.8, AST of <6, GFR est of 25. SPEP 2/14-- showed undetectable M spike.  On review of systems, pt reports fatigue, cold intolerance, decreased appetite, dehydration and denies leg swelling, new bone pains, tingling/numbness in hands/feet, abdominal pain, back pain and any other symptoms.  MEDICAL HISTORY:  Past Medical History:  Diagnosis Date  . Anemia   . Arthritis    "left knee" (11/24/2017)  . CAD (coronary artery disease)    CABG 2002  . Cancer [facility])    Multiple Myeloma  . CHF  (congestive heart failure) ([place])   . Chronic kidney disease   . Chronic renal insufficiency   . Diabetes mellitus without complication ([place])   . Dilated cardiomyopathy ([place])    echo in 2009 showed improvement with a normal EF  . HTN (hypertension)   . Hyperlipemia   . Hypertension   . Noncompliance   . PAD (peripheral artery disease) ([place])   . Pneumonia     SURGICAL HISTORY: Past Surgical History:  Procedure Laterality Date  . CARDIAC CATHETERIZATION  2002, 2006  . CORONARY ARTERY BYPASS GRAFT  2002   x4 DR. VAN TRIGT  . IR FLUORO GUIDED NEEDLE PLC ASPIRATION/INJECTION LOC  10/24/2019    SOCIAL HISTORY: Social History   Socioeconomic History  . Marital status: Married    Spouse name: Not on file  . Number of children: 1  . Years of education: Not on file  . Highest education level: Not on file  Occupational History  . Occupation: Furniture conservator/restorer  Tobacco Use  . Smoking status: Never Smoker  . Smokeless tobacco: Never Used  Vaping Use  . Vaping Use: Never used  Substance and Sexual Activity  . Alcohol use: Not Currently  . Drug use: Not Currently  . Sexual activity: Yes  Other Topics Concern  . Not on file  Social History Narrative   ** Merged History Encounter **       Social Determinants of Health   Financial Resource Strain: Not on file  Food Insecurity: Not on file  Transportation Needs: Not on file  Physical Activity: Not on file  Stress: Not on file  Social Connections: Not on file  Intimate Partner Violence: Not on file    FAMILY HISTORY: Family History  Problem Relation Age of Onset  . Hypertension Father   . Diabetes Mother        DIABETIC COMA  . Healthy Daughter     ALLERGIES:  has No Known Allergies.  MEDICATIONS:  Current Outpatient Medications  Medication Sig Dispense Refill  . Accu-Chek Softclix Lancets lancets Use Accu Chek softclix lancets to check blood sugar fasting or 2 hours after a meal every other day. 100 each 2  . acyclovir  (ZOVIRAX) 400 MG tablet Take 0.5 tablets (200 mg total) by mouth 2 (two) times daily. 60 tablet 11  . amLODipine (NORVASC) 5 MG tablet Take 5 mg by mouth daily.    [name] apixaban (ELIQUIS) 2.5 MG TABS tablet Take 1 tablet (2.5 mg total) by mouth 2 (two) times daily. 60 tablet 2  . atorvastatin (LIPITOR) 80 MG tablet Take 1 tablet by mouth once daily 90 tablet 3  . Blood Glucose Monitoring Suppl (ACCU-CHEK GUIDE ME) w/Device KIT 1 each by Does not apply route.    . carvedilol (COREG) 25 MG tablet Take 25 mg by mouth 2 (two) times  daily with a meal.    . dexamethasone (DECADRON) 4 MG tablet Take 12 mg by mouth See admin instructions. Take after chemo treatments    . glucose blood (ACCU-CHEK GUIDE) test strip Use Accu Chek test strips as instructed to check blood sugar fasting or two hours after meals, every other day. 100 each 2  . hydrALAZINE (APRESOLINE) 50 MG tablet TAKE 1 TABLET BY MOUTH THREE TIMES DAILY 270 tablet 3  . isosorbide mononitrate (IMDUR) 30 MG 24 hr tablet Take 1 tablet (30 mg total) by mouth daily. 90 tablet 2  . losartan (COZAAR) 100 MG tablet Take 1 tablet by mouth once daily 90 tablet 3  . potassium chloride SA (KLOR-CON) 20 MEQ tablet Take 1 tablet by mouth once daily 30 tablet 3  . prochlorperazine (COMPAZINE) 10 MG tablet Take 1 tablet (10 mg total) by mouth every 6 (six) hours as needed (Nausea or vomiting). 30 tablet 1  . repaglinide (PRANDIN) 1 MG tablet TAKE 1 TABLET BY MOUTH THREE TIMES DAILY BEFORE MEAL(S) 90 tablet 2  . senna-docusate (SENNA S) 8.6-50 MG tablet Take 2 tablets by mouth at bedtime. 60 tablet 5   No current facility-administered medications for this visit.   Facility-Administered Medications Ordered in Other Visits  Medication Dose Route Frequency Provider Last Rate Last Admin  . sodium chloride flush (NS) 0.9 % injection 10 mL  10 mL Intravenous PRN Brunetta Genera, MD      . sodium chloride flush (NS) 0.9 % injection 10 mL  10 mL Intracatheter Once  PRN Brunetta Genera, MD        REVIEW OF SYSTEMS:  10 Point review of Systems was done is negative except as noted above.  PHYSICAL EXAMINATION: ECOG PERFORMANCE STATUS: 0 - Asymptomatic  .BP (!) 110/53 (BP Location: Left Arm, Patient Position: Sitting)   Pulse 75   Temp (!) 97 F (36.1 C) (Tympanic)   Resp 18   Ht 5' 9" (1.753 m)   Wt 150 lb 11.2 oz (68.4 kg)   SpO2 100%   BMI 22.25 kg/m  Exam was given in a chair.   GENERAL:alert, in no acute distress and comfortable SKIN: no acute rashes, no significant lesions EYES: conjunctiva are pink and non-injected, sclera anicteric OROPHARYNX: MMM, no exudates, no oropharyngeal erythema or ulceration NECK: supple, no JVD LYMPH:  no palpable lymphadenopathy in the cervical, axillary or inguinal regions LUNGS: clear to auscultation b/l with normal respiratory effort HEART: regular rate & rhythm ABDOMEN:  normoactive bowel sounds , non tender, not distended. Extremity: no pedal edema PSYCH: alert & oriented x 3 with fluent speech NEURO: no focal motor/sensory deficits  LABORATORY DATA:  I have reviewed the data as listed  . CBC Latest Ref Rng & Units 09/09/2020 09/02/2020 08/26/2020  WBC 4.0 - 10.5 K/uL 6.7 7.3 7.2  Hemoglobin 13.0 - 17.0 g/dL 8.6(L) 8.3(L) 8.6(L)  Hematocrit 39.0 - 52.0 % 27.2(L) 26.1(L) 27.7(L)  Platelets 150 - 400 K/uL 289 272 346    . CMP Latest Ref Rng & Units 09/09/2020 09/02/2020 08/26/2020  Glucose 70 - 99 mg/dL 180(H) 147(H) 243(H)  BUN 8 - 23 mg/dL 28(H) 25(H) 21  Creatinine 0.61 - 1.24 mg/dL 2.74(H) 2.50(H) 2.36(H)  Sodium 135 - 145 mmol/L 139 138 140  Potassium 3.5 - 5.1 mmol/L 2.9(L) 3.2(L) 3.5  Chloride 98 - 111 mmol/L 111 109 111  CO2 22 - 32 mmol/L 17(L) 17(L) 19(L)  Calcium 8.9 - 10.3 mg/dL 8.8(L) 8.6(L) 9.3  Total Protein 6.5 - 8.1 g/dL 6.7 6.5 6.9  Total Bilirubin 0.3 - 1.2 mg/dL 0.6 0.6 0.4  Alkaline Phos 38 - 126 U/L 66 69 82  AST 15 - 41 U/L <6(L) <6(L) <6(L)  ALT 0 - 44 U/L <6 <6  <6   10/24/2019 FISH Panel:     10/24/2019 [facility] Bx Surgical Pathology:    RADIOGRAPHIC STUDIES: I have personally reviewed the radiological images as listed and agreed with the findings in the report. No results found.  ASSESSMENT & PLAN:    65 yo with   1) High risk Multiple myeloma -09/27/19 M Protein at 3.4 -10/25/2019 PET/CT ([id number]) revealed "1. No specific myelomatous lesions are identified. 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  2) Anemia ? Related to CKD vs ? Plasma cell dyscrasia 3) CKD ?etiology - HTN ? Cardiac issues ? Myeloma related. 4) recently diagnosed small Pulmonary embolism 5) Hypokalemia PLAN: -Discussed pt labwork today, 09/09/2020; anemic due to rheumatoid arthritis. Dehydrated. -Discussed 08/26/2020 MMP; m-spike not detected.  -Advised pt that due to his last Myeloma labs he has officially entered remission (February 2022).  -Advised pt we will get a repeat PET CT and Bm Bx. The pt does not wish to get a repeat Bm Bx at this time. -Will get IV iron. Goal Iron Sat >20.  -Discussed potential of starting Retacrit shots. Will continue to monitor at this time. -Advised pt to focus on drinking and eating as much as he can.  -Pt is not a good candidate for transplant given heart and kidney issues and overall functional status.. Will switch to maintenance treatment at this time. -Will  hold Carfilzomib. Begin maintenance Daratumumab q2weeks. Continue to monitor with labs. -Continue renally adjusted dose of Eliquis and hold Aspirin. -Continue potassium pills.  -continue f/u with PCP and nephrology -Will see back in 4 weeks with labs.   FOLLOW UP: Please schedule next 2 cycles (4 doses) of every 2 weeks maintenance daratumumab with port flush and labs MD visit and 4 weeks   The total time spent in the appointment was 30 minutes and more than 50% was on counseling and direct patient cares.   All of the patient's questions were answered with apparent satisfaction. The patient knows to call the clinic with any problems, questions or concerns.    Sullivan Lone MD [place] AAHIVMS [facility] Hematology/Oncology Physician [facility] [place]  (Office):       [phone] (Work cell):  [phone] (Fax):           [phone]  09/08/2020 10:53 AM  I, Reinaldo Raddle, am acting as scribe for Dr. Sullivan Lone, MD.   .I have reviewed the above documentation for accuracy and completeness, and I agree with the above. Brunetta Genera MD

## 2020-09-09 ENCOUNTER — Inpatient Hospital Stay: Payer: Medicare Other

## 2020-09-09 ENCOUNTER — Inpatient Hospital Stay (HOSPITAL_BASED_OUTPATIENT_CLINIC_OR_DEPARTMENT_OTHER): Payer: Medicare Other | Admitting: Hematology

## 2020-09-09 ENCOUNTER — Other Ambulatory Visit: Payer: Self-pay

## 2020-09-09 VITALS — BP 120/57 | HR 68 | Temp 98.3°F | Resp 18

## 2020-09-09 VITALS — BP 110/53 | HR 75 | Temp 97.0°F | Resp 18 | Ht 69.0 in | Wt 150.7 lb

## 2020-09-09 DIAGNOSIS — Z5112 Encounter for antineoplastic immunotherapy: Secondary | ICD-10-CM

## 2020-09-09 DIAGNOSIS — C9000 Multiple myeloma not having achieved remission: Secondary | ICD-10-CM

## 2020-09-09 DIAGNOSIS — I2510 Atherosclerotic heart disease of native coronary artery without angina pectoris: Secondary | ICD-10-CM

## 2020-09-09 DIAGNOSIS — E876 Hypokalemia: Secondary | ICD-10-CM

## 2020-09-09 DIAGNOSIS — Z7189 Other specified counseling: Secondary | ICD-10-CM

## 2020-09-09 DIAGNOSIS — Z5111 Encounter for antineoplastic chemotherapy: Secondary | ICD-10-CM | POA: Diagnosis not present

## 2020-09-09 LAB — CMP (CANCER CENTER ONLY)
ALT: <6 U/L (ref 0–44)
AST: <6 U/L — ABNORMAL LOW (ref 15–41)
Albumin: 2.8 g/dL — ABNORMAL LOW (ref 3.5–5.0)
Alkaline Phosphatase: 66 U/L (ref 38–126)
Anion gap: 11 (ref 5–15)
BUN: 28 mg/dL — ABNORMAL HIGH (ref 8–23)
CO2: 17 mmol/L — ABNORMAL LOW (ref 22–32)
Calcium: 8.8 mg/dL — ABNORMAL LOW (ref 8.9–10.3)
Chloride: 111 mmol/L (ref 98–111)
Creatinine: 2.74 mg/dL — ABNORMAL HIGH (ref 0.61–1.24)
GFR, Estimated: 25 mL/min — ABNORMAL LOW (ref >60)
Glucose, Bld: 180 mg/dL — ABNORMAL HIGH (ref 70–99)
Potassium: 2.9 mmol/L — ABNORMAL LOW (ref 3.5–5.1)
Sodium: 139 mmol/L (ref 135–145)
Total Bilirubin: 0.6 mg/dL (ref 0.3–1.2)
Total Protein: 6.7 g/dL (ref 6.5–8.1)

## 2020-09-09 LAB — CBC WITH DIFFERENTIAL/PLATELET
Abs Immature Granulocytes: 0.11 10*3/uL — ABNORMAL HIGH (ref 0.00–0.07)
Basophils Absolute: 0 10*3/uL (ref 0.0–0.1)
Basophils Relative: 0 %
Eosinophils Absolute: 0.1 10*3/uL (ref 0.0–0.5)
Eosinophils Relative: 2 %
HCT: 27.2 % — ABNORMAL LOW (ref 39.0–52.0)
Hemoglobin: 8.6 g/dL — ABNORMAL LOW (ref 13.0–17.0)
Immature Granulocytes: 2 %
Lymphocytes Relative: 14 %
Lymphs Abs: 1 10*3/uL (ref 0.7–4.0)
MCH: 27 pg (ref 26.0–34.0)
MCHC: 31.6 g/dL (ref 30.0–36.0)
MCV: 85.5 fL (ref 80.0–100.0)
Monocytes Absolute: 0.7 10*3/uL (ref 0.1–1.0)
Monocytes Relative: 11 %
Neutro Abs: 4.8 10*3/uL (ref 1.7–7.7)
Neutrophils Relative %: 71 %
Platelets: 289 10*3/uL (ref 150–400)
RBC: 3.18 MIL/uL — ABNORMAL LOW (ref 4.22–5.81)
RDW: 17.5 % — ABNORMAL HIGH (ref 11.5–15.5)
WBC: 6.7 10*3/uL (ref 4.0–10.5)
nRBC: 0 % (ref 0.0–0.2)

## 2020-09-09 MED ORDER — POTASSIUM CHLORIDE CRYS ER 20 MEQ PO TBCR
40.0000 meq | EXTENDED_RELEASE_TABLET | Freq: Once | ORAL | Status: AC
  Administered 2020-09-09: 40 meq via ORAL

## 2020-09-09 MED ORDER — SODIUM CHLORIDE 0.9 % IV SOLN
16.0000 mg/kg | Freq: Once | INTRAVENOUS | Status: AC
  Administered 2020-09-09: 1100 mg via INTRAVENOUS
  Filled 2020-09-09: qty 40

## 2020-09-09 MED ORDER — MONTELUKAST SODIUM 10 MG PO TABS
10.0000 mg | ORAL_TABLET | Freq: Once | ORAL | Status: AC
  Administered 2020-09-09: 10 mg via ORAL

## 2020-09-09 MED ORDER — MEPERIDINE HCL 25 MG/ML IJ SOLN
INTRAMUSCULAR | Status: AC
  Filled 2020-09-09: qty 1

## 2020-09-09 MED ORDER — ACETAMINOPHEN 325 MG PO TABS
650.0000 mg | ORAL_TABLET | Freq: Once | ORAL | Status: AC
  Administered 2020-09-09: 650 mg via ORAL

## 2020-09-09 MED ORDER — METHYLPREDNISOLONE SODIUM SUCC 125 MG IJ SOLR
80.0000 mg | Freq: Once | INTRAMUSCULAR | Status: AC
  Administered 2020-09-09: 80 mg via INTRAVENOUS

## 2020-09-09 MED ORDER — METHYLPREDNISOLONE SODIUM SUCC 125 MG IJ SOLR
INTRAMUSCULAR | Status: AC
  Filled 2020-09-09: qty 2

## 2020-09-09 MED ORDER — ACETAMINOPHEN 325 MG PO TABS
ORAL_TABLET | ORAL | Status: AC
  Filled 2020-09-09: qty 2

## 2020-09-09 MED ORDER — SODIUM CHLORIDE 0.9 % IV SOLN
Freq: Once | INTRAVENOUS | Status: AC
  Filled 2020-09-09: qty 250

## 2020-09-09 MED ORDER — MONTELUKAST SODIUM 10 MG PO TABS
ORAL_TABLET | ORAL | Status: AC
  Filled 2020-09-09: qty 1

## 2020-09-09 MED ORDER — POTASSIUM CHLORIDE CRYS ER 20 MEQ PO TBCR
EXTENDED_RELEASE_TABLET | ORAL | Status: AC
  Filled 2020-09-09: qty 2

## 2020-09-09 MED ORDER — MEPERIDINE HCL 25 MG/ML IJ SOLN
25.0000 mg | Freq: Once | INTRAMUSCULAR | Status: AC
Start: 2020-09-09 — End: 2020-09-09
  Administered 2020-09-09: 25 mg via INTRAVENOUS

## 2020-09-09 MED ORDER — SODIUM CHLORIDE 0.9 % IV SOLN
40.0000 mg | Freq: Once | INTRAVENOUS | Status: AC
  Administered 2020-09-09: 40 mg via INTRAVENOUS
  Filled 2020-09-09: qty 4

## 2020-09-09 MED ORDER — DIPHENHYDRAMINE HCL 25 MG PO CAPS
50.0000 mg | ORAL_CAPSULE | Freq: Once | ORAL | Status: AC
  Administered 2020-09-09: 50 mg via ORAL

## 2020-09-09 MED ORDER — DIPHENHYDRAMINE HCL 25 MG PO CAPS
ORAL_CAPSULE | ORAL | Status: AC
  Filled 2020-09-09: qty 2

## 2020-09-09 NOTE — Progress Notes (Signed)
Verbal order/Dr. Irene Limbo: Ok to receive treatment (Darzalex) today with Creatinine 2.74

## 2020-09-09 NOTE — Patient Instructions (Signed)
[facility] Discharge Instructions for Patients Receiving Chemotherapy  Today you received the following chemotherapy agents: daratumumab.  To help prevent nausea and vomiting after your treatment, we encourage you to take your nausea medication as directed.   If you develop nausea and vomiting that is not controlled by your nausea medication, call the clinic.   BELOW ARE SYMPTOMS THAT SHOULD BE REPORTED IMMEDIATELY:  *FEVER GREATER THAN 100.5 F  *CHILLS WITH OR WITHOUT FEVER  NAUSEA AND VOMITING THAT IS NOT CONTROLLED WITH YOUR NAUSEA MEDICATION  *UNUSUAL SHORTNESS OF BREATH  *UNUSUAL BRUISING OR BLEEDING  TENDERNESS IN MOUTH AND THROAT WITH OR WITHOUT PRESENCE OF ULCERS  *URINARY PROBLEMS  *BOWEL PROBLEMS  UNUSUAL RASH Items with * indicate a potential emergency and should be followed up as soon as possible.  Feel free to call the clinic should you have any questions or concerns. The clinic phone number is [phone].  Please show the CHEMO ALERT CARD at check-in to the Emergency Department and triage nurse.   

## 2020-09-13 ENCOUNTER — Telehealth: Payer: Self-pay | Admitting: Hematology

## 2020-09-13 NOTE — Telephone Encounter (Signed)
Scheduled per 02/28 los, patient has been called and notified.  ?

## 2020-09-23 ENCOUNTER — Inpatient Hospital Stay: Payer: Medicare Other

## 2020-09-23 ENCOUNTER — Inpatient Hospital Stay: Payer: Medicare Other | Attending: Hematology

## 2020-09-23 ENCOUNTER — Other Ambulatory Visit: Payer: Self-pay | Admitting: Hematology

## 2020-09-23 ENCOUNTER — Other Ambulatory Visit: Payer: Self-pay

## 2020-09-23 VITALS — BP 126/60 | HR 75 | Temp 98.3°F | Resp 16 | Wt 152.0 lb

## 2020-09-23 DIAGNOSIS — E785 Hyperlipidemia, unspecified: Secondary | ICD-10-CM | POA: Diagnosis not present

## 2020-09-23 DIAGNOSIS — Z951 Presence of aortocoronary bypass graft: Secondary | ICD-10-CM | POA: Insufficient documentation

## 2020-09-23 DIAGNOSIS — I10 Essential (primary) hypertension: Secondary | ICD-10-CM | POA: Diagnosis not present

## 2020-09-23 DIAGNOSIS — M069 Rheumatoid arthritis, unspecified: Secondary | ICD-10-CM | POA: Insufficient documentation

## 2020-09-23 DIAGNOSIS — I2510 Atherosclerotic heart disease of native coronary artery without angina pectoris: Secondary | ICD-10-CM | POA: Diagnosis not present

## 2020-09-23 DIAGNOSIS — E119 Type 2 diabetes mellitus without complications: Secondary | ICD-10-CM | POA: Insufficient documentation

## 2020-09-23 DIAGNOSIS — C9000 Multiple myeloma not having achieved remission: Secondary | ICD-10-CM

## 2020-09-23 DIAGNOSIS — M1990 Unspecified osteoarthritis, unspecified site: Secondary | ICD-10-CM | POA: Diagnosis not present

## 2020-09-23 DIAGNOSIS — D509 Iron deficiency anemia, unspecified: Secondary | ICD-10-CM | POA: Diagnosis not present

## 2020-09-23 DIAGNOSIS — Z5112 Encounter for antineoplastic immunotherapy: Secondary | ICD-10-CM | POA: Diagnosis present

## 2020-09-23 DIAGNOSIS — Z7189 Other specified counseling: Secondary | ICD-10-CM

## 2020-09-23 DIAGNOSIS — N189 Chronic kidney disease, unspecified: Secondary | ICD-10-CM | POA: Diagnosis not present

## 2020-09-23 DIAGNOSIS — I2699 Other pulmonary embolism without acute cor pulmonale: Secondary | ICD-10-CM | POA: Diagnosis not present

## 2020-09-23 DIAGNOSIS — I509 Heart failure, unspecified: Secondary | ICD-10-CM | POA: Insufficient documentation

## 2020-09-23 DIAGNOSIS — M2550 Pain in unspecified joint: Secondary | ICD-10-CM | POA: Insufficient documentation

## 2020-09-23 DIAGNOSIS — Z79899 Other long term (current) drug therapy: Secondary | ICD-10-CM | POA: Diagnosis not present

## 2020-09-23 DIAGNOSIS — Z7984 Long term (current) use of oral hypoglycemic drugs: Secondary | ICD-10-CM | POA: Diagnosis not present

## 2020-09-23 DIAGNOSIS — D508 Other iron deficiency anemias: Secondary | ICD-10-CM

## 2020-09-23 DIAGNOSIS — I420 Dilated cardiomyopathy: Secondary | ICD-10-CM | POA: Insufficient documentation

## 2020-09-23 DIAGNOSIS — R627 Adult failure to thrive: Secondary | ICD-10-CM | POA: Diagnosis not present

## 2020-09-23 DIAGNOSIS — R5383 Other fatigue: Secondary | ICD-10-CM | POA: Diagnosis not present

## 2020-09-23 DIAGNOSIS — D649 Anemia, unspecified: Secondary | ICD-10-CM

## 2020-09-23 LAB — CMP (CANCER CENTER ONLY)
ALT: <6 U/L (ref 0–44)
AST: 6 U/L — ABNORMAL LOW (ref 15–41)
Albumin: 2.7 g/dL — ABNORMAL LOW (ref 3.5–5.0)
Alkaline Phosphatase: 81 U/L (ref 38–126)
Anion gap: 12 (ref 5–15)
BUN: 31 mg/dL — ABNORMAL HIGH (ref 8–23)
CO2: 16 mmol/L — ABNORMAL LOW (ref 22–32)
Calcium: 8.8 mg/dL — ABNORMAL LOW (ref 8.9–10.3)
Chloride: 110 mmol/L (ref 98–111)
Creatinine: 2.58 mg/dL — ABNORMAL HIGH (ref 0.61–1.24)
GFR, Estimated: 27 mL/min — ABNORMAL LOW (ref >60)
Glucose, Bld: 144 mg/dL — ABNORMAL HIGH (ref 70–99)
Potassium: 4 mmol/L (ref 3.5–5.1)
Sodium: 138 mmol/L (ref 135–145)
Total Bilirubin: 0.3 mg/dL (ref 0.3–1.2)
Total Protein: 6.6 g/dL (ref 6.5–8.1)

## 2020-09-23 LAB — CBC WITH DIFFERENTIAL/PLATELET
Abs Immature Granulocytes: 0.05 10*3/uL (ref 0.00–0.07)
Basophils Absolute: 0 10*3/uL (ref 0.0–0.1)
Basophils Relative: 0 %
Eosinophils Absolute: 0.1 10*3/uL (ref 0.0–0.5)
Eosinophils Relative: 2 %
HCT: 26.9 % — ABNORMAL LOW (ref 39.0–52.0)
Hemoglobin: 8.4 g/dL — ABNORMAL LOW (ref 13.0–17.0)
Immature Granulocytes: 1 %
Lymphocytes Relative: 12 %
Lymphs Abs: 1 10*3/uL (ref 0.7–4.0)
MCH: 26.8 pg (ref 26.0–34.0)
MCHC: 31.2 g/dL (ref 30.0–36.0)
MCV: 85.9 fL (ref 80.0–100.0)
Monocytes Absolute: 0.4 10*3/uL (ref 0.1–1.0)
Monocytes Relative: 5 %
Neutro Abs: 6.6 10*3/uL (ref 1.7–7.7)
Neutrophils Relative %: 80 %
Platelets: 399 10*3/uL (ref 150–400)
RBC: 3.13 MIL/uL — ABNORMAL LOW (ref 4.22–5.81)
RDW: 16.3 % — ABNORMAL HIGH (ref 11.5–15.5)
WBC: 8.2 10*3/uL (ref 4.0–10.5)
nRBC: 0 % (ref 0.0–0.2)

## 2020-09-23 MED ORDER — SODIUM CHLORIDE 0.9 % IV SOLN
16.0000 mg/kg | Freq: Once | INTRAVENOUS | Status: AC
  Administered 2020-09-23: 1100 mg via INTRAVENOUS
  Filled 2020-09-23: qty 40

## 2020-09-23 MED ORDER — METHYLPREDNISOLONE SODIUM SUCC 125 MG IJ SOLR
INTRAMUSCULAR | Status: AC
  Filled 2020-09-23: qty 2

## 2020-09-23 MED ORDER — ACETAMINOPHEN 325 MG PO TABS
650.0000 mg | ORAL_TABLET | Freq: Once | ORAL | Status: AC
  Administered 2020-09-23: 650 mg via ORAL

## 2020-09-23 MED ORDER — ACETAMINOPHEN 325 MG PO TABS
ORAL_TABLET | ORAL | Status: AC
  Filled 2020-09-23: qty 2

## 2020-09-23 MED ORDER — DIPHENHYDRAMINE HCL 25 MG PO CAPS
ORAL_CAPSULE | ORAL | Status: AC
  Filled 2020-09-23: qty 2

## 2020-09-23 MED ORDER — MEPERIDINE HCL 25 MG/ML IJ SOLN
25.0000 mg | Freq: Once | INTRAMUSCULAR | Status: AC
  Administered 2020-09-23: 25 mg via INTRAVENOUS

## 2020-09-23 MED ORDER — MONTELUKAST SODIUM 10 MG PO TABS
ORAL_TABLET | ORAL | Status: AC
  Filled 2020-09-23: qty 1

## 2020-09-23 MED ORDER — MEPERIDINE HCL 25 MG/ML IJ SOLN
INTRAMUSCULAR | Status: AC
  Filled 2020-09-23: qty 1

## 2020-09-23 MED ORDER — DIPHENHYDRAMINE HCL 25 MG PO CAPS
50.0000 mg | ORAL_CAPSULE | Freq: Once | ORAL | Status: AC
Start: 2020-09-23 — End: 2020-09-23
  Administered 2020-09-23: 50 mg via ORAL

## 2020-09-23 MED ORDER — METHYLPREDNISOLONE SODIUM SUCC 125 MG IJ SOLR
80.0000 mg | Freq: Once | INTRAMUSCULAR | Status: AC
  Administered 2020-09-23: 80 mg via INTRAVENOUS

## 2020-09-23 MED ORDER — SODIUM CHLORIDE 0.9 % IV SOLN
300.0000 mg | Freq: Once | INTRAVENOUS | Status: AC
  Administered 2020-09-23: 300 mg via INTRAVENOUS
  Filled 2020-09-23: qty 5

## 2020-09-23 MED ORDER — SODIUM CHLORIDE 0.9 % IV SOLN
Freq: Once | INTRAVENOUS | Status: AC
  Filled 2020-09-23: qty 250

## 2020-09-23 MED ORDER — SODIUM CHLORIDE 0.9 % IV SOLN
40.0000 mg | Freq: Once | INTRAVENOUS | Status: AC
  Administered 2020-09-23: 40 mg via INTRAVENOUS
  Filled 2020-09-23: qty 4

## 2020-09-23 MED ORDER — MONTELUKAST SODIUM 10 MG PO TABS
10.0000 mg | ORAL_TABLET | Freq: Once | ORAL | Status: AC
  Administered 2020-09-23: 10 mg via ORAL

## 2020-09-23 MED ORDER — FAMOTIDINE IN NACL 20-0.9 MG/50ML-% IV SOLN
INTRAVENOUS | Status: AC
  Filled 2020-09-23: qty 50

## 2020-09-23 NOTE — Patient Instructions (Signed)
[facility] Discharge Instructions for Patients Receiving Chemotherapy  Today you received the following chemotherapy agents: daratumumab.  To help prevent nausea and vomiting after your treatment, we encourage you to take your nausea medication as directed.   If you develop nausea and vomiting that is not controlled by your nausea medication, call the clinic.   BELOW ARE SYMPTOMS THAT SHOULD BE REPORTED IMMEDIATELY:  *FEVER GREATER THAN 100.5 F  *CHILLS WITH OR WITHOUT FEVER  NAUSEA AND VOMITING THAT IS NOT CONTROLLED WITH YOUR NAUSEA MEDICATION  *UNUSUAL SHORTNESS OF BREATH  *UNUSUAL BRUISING OR BLEEDING  TENDERNESS IN MOUTH AND THROAT WITH OR WITHOUT PRESENCE OF ULCERS  *URINARY PROBLEMS  *BOWEL PROBLEMS  UNUSUAL RASH Items with * indicate a potential emergency and should be followed up as soon as possible.  Feel free to call the clinic should you have any questions or concerns. The clinic phone number is [phone].  Please show the CHEMO ALERT CARD at check-in to the Emergency Department and triage nurse.   

## 2020-09-26 LAB — MULTIPLE MYELOMA PANEL, SERUM
Albumin SerPl Elph-Mcnc: 2.8 g/dL — ABNORMAL LOW (ref 2.9–4.4)
Albumin/Glob SerPl: 1 (ref 0.7–1.7)
Alpha 1: 0.3 g/dL (ref 0.0–0.4)
Alpha2 Glob SerPl Elph-Mcnc: 1.1 g/dL — ABNORMAL HIGH (ref 0.4–1.0)
B-Globulin SerPl Elph-Mcnc: 0.8 g/dL (ref 0.7–1.3)
Gamma Glob SerPl Elph-Mcnc: 0.8 g/dL (ref 0.4–1.8)
Globulin, Total: 3 g/dL (ref 2.2–3.9)
IgA: 87 mg/dL (ref 61–437)
IgG (Immunoglobin G), Serum: 875 mg/dL (ref 603–1613)
IgM (Immunoglobulin M), Srm: 25 mg/dL (ref 20–172)
Total Protein ELP: 5.8 g/dL — ABNORMAL LOW (ref 6.0–8.5)

## 2020-10-02 ENCOUNTER — Telehealth: Payer: Self-pay | Admitting: Hematology

## 2020-10-02 NOTE — Telephone Encounter (Signed)
Per sch msg, patient would like to move 3/28 appt to 3/29. Called and spoke with patient. He wanted all appts on the same day. After I explained to him the reason for decoupling, he is ok to keep appts as is. No changes were made

## 2020-10-04 ENCOUNTER — Other Ambulatory Visit: Payer: Medicare Other

## 2020-10-06 NOTE — Progress Notes (Signed)
HEMATOLOGY/ONCOLOGY CLINIC NOTE  Date of Service: 10/07/2020  Patient Care Team: [name], Joseph Halsted, MD as PCP - General (Internal Medicine) [name], Joseph M, MD as PCP - Cardiology (Cardiology) [name], Joseph Halsted, MD (Internal Medicine)  CHIEF COMPLAINTS/PURPOSE OF CONSULTATION:  Continued mx of myeloma  HISTORY OF PRESENTING ILLNESS:  [name] is a wonderful 65 y.o. male who has been referred to [place] by [name] for evaluation and management of elevated protein. Pt is accompanied today by his wife, [name]. The pt reports that he is doing well overall.    The pt reports he was first told that he had Diabetes two years ago. He has not previously needed to be on medications for his diabetes but has recently been started on Januvia by Joseph. Elayne Welch, his Endocrinologist. His blood glucose was 149 this morning. He sees [name] - NP and Joseph. Martin[place] for Cardiology. Pt has CAD and had to have open heart surgery 20 years ago. He also has HTN that he feels has been stable. His Cardiology team is currently managing his heart medications and recently told pt to discontinue taking Furosemide due kidney function on last labs. Joseph. [place] and [name] have been essentially functioning as his primary care, as he does not have a PCP at this time. He has an upcoming appointment with [name] in the beginning of May and Joseph. Dwyane Welch next Tuesday. Pt is currently using Ibuprofen a couple of times per week for his knee pain.   Pt has felt the same in the last 3-6 months and denies any new bone pain of any kinds. He is eating, functioning, and moving about as normal. He has lost about 13 lbs over the last few months. Pt does not smoke and does not drink much alcohol.    Most recent lab results (09/04/2019) of CBC, BMP and Hepatic function panel is as follows: all values are WNL except for RBC at 3.64, Hgb at 9.8, HCT at 29.8, Glucose at 129, BUN at 64, Creatinine at  2.62, GFR Est Af Am at 29, CO2 at 15, Total Protein at 10.0, Albumin at 3.6, ALP at 163, ALT at 45.    On review of systems, pt reports unexpected weight loss, left knee pain and denies new back pain, new shoulder pain, new hip pain, chest pain, SOB, leg swelling, testicular pain/swelling and any other symptoms.    On PMHx the pt reports Left Knee Arthritis, CAD, CHF, Type II Diabetes, HTN, HLD, Cardiac Catheterization, Coronary Artery Bypass Graft x4 (2002). On Social Hx the pt reports that he is a non-smoker and does not drink much alcohol.  INTERVAL HISTORY:    [name] is a wonderful 65 y.o. male is here for evaluation and management of Multiple Myeloma. The patient's last visit with [place] was on 09/09/2020. The pt reports that he is doing well overall.  The pt reports that he has not been eating. He has lost 15 pounds in 13 days and is down to 137 pounds. The pt is experiencing severe fatigue and notes he cannot eat nor sit up. The pt notes his blood sugars have been controlled well. He notes that he will eat one day and not eat for the next two or so days. He is unsure of what has caused this. The pt denies any depression. The pt's wife notes that his urine has changed in color and the pt does not eat at all, even  when suggested. She notes this lack of appetite has been going on for around a month. She notes that he has been off his medications. She notes the pt sleeps a lot. The pt's last bowel movement was around four days ago.  Lab results today 10/07/2020 of CBC w/diff and CMP is as follows: all values are WNL except for WBC of 12.3K, RBC of 3.42, Hgb of 9.1, HCT of 28.5, Neutro Abs of 10.4K, Abs Immature Granulocytes of 0.12, Sodium of 132, CO2 of 14, Glucose of 175, BUN of 35, Creatinine of 2.81, Albumin of 2.5, GFR est of 24.  On review of systems, pt reports no food intake, change in urine color, severe fatigue, weakness, change in bowel habits and denies fevers, chills, night  sweats, difficulty passing urine, decreased appetite, difficulty swallowing, SOB, chest pain, abdominal pain, and any other symptoms.  MEDICAL HISTORY:  Past Medical History:  Diagnosis Date  . Anemia   . Arthritis    "left knee" (11/24/2017)  . CAD (coronary artery disease)    CABG 2002  . Cancer [facility])    Multiple Myeloma  . CHF (congestive heart failure) ([place])   . Chronic kidney disease   . Chronic renal insufficiency   . Diabetes mellitus without complication ([place])   . Dilated cardiomyopathy ([place])    echo in 2009 showed improvement with a normal EF  . HTN (hypertension)   . Hyperlipemia   . Hypertension   . Noncompliance   . PAD (peripheral artery disease) ([place])   . Pneumonia     SURGICAL HISTORY: Past Surgical History:  Procedure Laterality Date  . CARDIAC CATHETERIZATION  2002, 2006  . CORONARY ARTERY BYPASS GRAFT  2002   x4 Joseph. VAN TRIGT  . IR FLUORO GUIDED NEEDLE PLC ASPIRATION/INJECTION LOC  10/24/2019    SOCIAL HISTORY: Social History   Socioeconomic History  . Marital status: Married    Spouse name: Not on file  . Number of children: 1  . Years of education: Not on file  . Highest education level: Not on file  Occupational History  . Occupation: Furniture conservator/restorer  Tobacco Use  . Smoking status: Never Smoker  . Smokeless tobacco: Never Used  Vaping Use  . Vaping Use: Never used  Substance and Sexual Activity  . Alcohol use: Not Currently  . Drug use: Not Currently  . Sexual activity: Yes  Other Topics Concern  . Not on file  Social History Narrative   ** Merged History Encounter **       Social Determinants of Health   Financial Resource Strain: Not on file  Food Insecurity: Not on file  Transportation Needs: Not on file  Physical Activity: Not on file  Stress: Not on file  Social Connections: Not on file  Intimate Partner Violence: Not on file    FAMILY HISTORY: Family History  Problem Relation Age of Onset  . Hypertension Father   .  Diabetes Mother        DIABETIC COMA  . Healthy Daughter     ALLERGIES:  has No Known Allergies.  MEDICATIONS:  Current Outpatient Medications  Medication Sig Dispense Refill  . Accu-Chek Softclix Lancets lancets Use Accu Chek softclix lancets to check blood sugar fasting or 2 hours after a meal every other day. 100 each 2  . acyclovir (ZOVIRAX) 400 MG tablet Take 0.5 tablets (200 mg total) by mouth 2 (two) times daily. 60 tablet 11  . amLODipine (NORVASC) 5 MG tablet Take 5 mg by  mouth daily.    [name] apixaban (ELIQUIS) 2.5 MG TABS tablet Take 1 tablet (2.5 mg total) by mouth 2 (two) times daily. 60 tablet 2  . atorvastatin (LIPITOR) 80 MG tablet Take 1 tablet by mouth once daily 90 tablet 3  . Blood Glucose Monitoring Suppl (ACCU-CHEK GUIDE ME) w/Device KIT 1 each by Does not apply route.    . carvedilol (COREG) 25 MG tablet Take 25 mg by mouth 2 (two) times daily with a meal.    . dexamethasone (DECADRON) 4 MG tablet Take 12 mg by mouth See admin instructions. Take after chemo treatments    . glucose blood (ACCU-CHEK GUIDE) test strip Use Accu Chek test strips as instructed to check blood sugar fasting or two hours after meals, every other day. 100 each 2  . hydrALAZINE (APRESOLINE) 50 MG tablet TAKE 1 TABLET BY MOUTH THREE TIMES DAILY 270 tablet 3  . isosorbide mononitrate (IMDUR) 30 MG 24 hr tablet Take 1 tablet (30 mg total) by mouth daily. 90 tablet 2  . losartan (COZAAR) 100 MG tablet Take 1 tablet by mouth once daily 90 tablet 3  . potassium chloride SA (KLOR-CON) 20 MEQ tablet Take 1 tablet by mouth once daily 30 tablet 3  . prochlorperazine (COMPAZINE) 10 MG tablet Take 1 tablet (10 mg total) by mouth every 6 (six) hours as needed (Nausea or vomiting). 30 tablet 1  . repaglinide (PRANDIN) 1 MG tablet TAKE 1 TABLET BY MOUTH THREE TIMES DAILY BEFORE MEAL(S) 90 tablet 2  . senna-docusate (SENNA S) 8.6-50 MG tablet Take 2 tablets by mouth at bedtime. 60 tablet 5   No current  facility-administered medications for this visit.   Facility-Administered Medications Ordered in Other Visits  Medication Dose Route Frequency Provider Last Rate Last Admin  . sodium chloride flush (NS) 0.9 % injection 10 mL  10 mL Intravenous PRN Brunetta Genera, MD      . sodium chloride flush (NS) 0.9 % injection 10 mL  10 mL Intracatheter Once PRN Brunetta Genera, MD        REVIEW OF SYSTEMS:  10 Point review of Systems was done is negative except as noted above.  PHYSICAL EXAMINATION: ECOG PERFORMANCE STATUS: 0 - Asymptomatic  .BP 139/90 (BP Location: Right Wrist, Patient Position: Sitting)   Pulse (!) 113   Temp (!) 100.4 F (38 C) (Tympanic)   Resp 16   Ht $R'5\' 9"'Qe$  (1.753 Welch)   Wt 137 lb 12.8 oz (62.5 kg)   SpO2 98%   BMI 20.35 kg/Welch   Exam was given in a wheelchair.  GENERAL:alert, in no acute distress and comfortable SKIN: no acute rashes, no significant lesions EYES: conjunctiva are pink and non-injected, sclera anicteric OROPHARYNX: MMM, no exudates, no oropharyngeal erythema or ulceration NECK: supple, no JVD LYMPH:  no palpable lymphadenopathy in the cervical, axillary or inguinal regions LUNGS: clear to auscultation b/l with normal respiratory effort HEART: regular rate & rhythm ABDOMEN:  normoactive bowel sounds , non tender, not distended. Extremity: no pedal edema PSYCH: alert & oriented x 3 with fluent speech NEURO: no focal motor/sensory deficits  LABORATORY DATA:  I have reviewed the data as listed  . CBC Latest Ref Rng & Units 10/07/2020 09/23/2020 09/09/2020  WBC 4.0 - 10.5 K/uL 12.3(H) 8.2 6.7  Hemoglobin 13.0 - 17.0 g/dL 9.1(L) 8.4(L) 8.6(L)  Hematocrit 39.0 - 52.0 % 28.5(L) 26.9(L) 27.2(L)  Platelets 150 - 400 K/uL 317 399 289    . CMP Latest Ref  Rng & Units 10/07/2020 09/23/2020 09/09/2020  Glucose 70 - 99 mg/dL 175(H) 144(H) 180(H)  BUN 8 - 23 mg/dL 35(H) 31(H) 28(H)  Creatinine 0.61 - 1.24 mg/dL 2.81(H) 2.58(H) 2.74(H)  Sodium 135 -  145 mmol/L 132(L) 138 139  Potassium 3.5 - 5.1 mmol/L 3.7 4.0 2.9(L)  Chloride 98 - 111 mmol/L 103 110 111  CO2 22 - 32 mmol/L 14(L) 16(L) 17(L)  Calcium 8.9 - 10.3 mg/dL 9.2 8.8(L) 8.8(L)  Total Protein 6.5 - 8.1 g/dL 7.2 6.6 6.7  Total Bilirubin 0.3 - 1.2 mg/dL 0.5 0.3 0.6  Alkaline Phos 38 - 126 U/L 82 81 66  AST 15 - 41 U/L 19 6(L) <6(L)  ALT 0 - 44 U/L 16 <6 <6   10/24/2019 FISH Panel:     10/24/2019 [facility] Surgical Pathology:    RADIOGRAPHIC STUDIES: I have personally reviewed the radiological images as listed and agreed with the findings in the report. No results found.  ASSESSMENT & PLAN:    65 yo with   1) High risk Multiple myeloma -09/27/19 Welch Protein at 3.4 -10/25/2019 PET/CT ([id number]) revealed "1. No specific myelomatous lesions are identified. 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  2) Anemia ? Related to CKD vs ? Plasma cell dyscrasia 3) CKD ?etiology - HTN ? Cardiac issues ? Myeloma related. 4) recently diagnosed small Pulmonary embolism 5) Hypokalemia  PLAN: -Discussed pt labwork today, 10/07/2020; kidney numbers elevated, dehydrated. Counts stable. -Advised pt that from a Myeloma standpoint, he is in remission and there is no more active Myeloma. He should be feeling improved and appetite improved now that he is only on maintenance treatment. -Advised pt we may have to admit him  to the hospital for failure to thrive.  -Discussed tube feeding if the pt continues to refuse to eat. -Advised pt that chronic uncontrolled rheumatoid arthritis can cause sever fatigue and pain. Discussed UTI that could cause urine changes. -Advised pt that dehydration and not eating in combination with BP medications can cause issues. -Will hold maintenance Daratumumab q2weeks at this time. -Continue renally adjusted dose of Eliquis and hold Aspirin. -Continue potassium pills.  -Will admit the pt to the ER today for failure to thrive.   FOLLOW UP: Pt was sent to ED for failure to thrive.  Will reschedule accordingly.   The total time spent in the appointment was 30 minutes and more than 50% was on counseling and direct patient cares, co-ordination of cares with ED     All of the patient's questions were answered with apparent satisfaction. The patient knows to call the clinic with any problems, questions or concerns.    Sullivan Lone MD [place] AAHIVMS [facility] Hematology/Oncology Physician [facility]  (Office):       [phone] (Work cell):  [phone] (Fax):           [phone]  10/07/2020 11:07 AM  I, Reinaldo Raddle, am acting as scribe for Joseph. Sullivan Lone, MD.    .I  have reviewed the above documentation for accuracy and completeness, and I agree with the above. Brunetta Genera MD

## 2020-10-07 ENCOUNTER — Other Ambulatory Visit: Payer: Self-pay

## 2020-10-07 ENCOUNTER — Inpatient Hospital Stay: Payer: Medicare Other

## 2020-10-07 ENCOUNTER — Encounter (HOSPITAL_COMMUNITY): Payer: Self-pay

## 2020-10-07 ENCOUNTER — Ambulatory Visit: Payer: Medicare Other | Admitting: Endocrinology

## 2020-10-07 ENCOUNTER — Inpatient Hospital Stay (HOSPITAL_COMMUNITY): Payer: Medicare Other

## 2020-10-07 ENCOUNTER — Inpatient Hospital Stay (HOSPITAL_BASED_OUTPATIENT_CLINIC_OR_DEPARTMENT_OTHER): Payer: Medicare Other | Admitting: Hematology

## 2020-10-07 ENCOUNTER — Emergency Department (HOSPITAL_COMMUNITY): Payer: Medicare Other

## 2020-10-07 ENCOUNTER — Inpatient Hospital Stay (HOSPITAL_COMMUNITY)
Admission: EM | Admit: 2020-10-07 | Discharge: 2020-10-11 | DRG: 872 | Disposition: A | Discharge status: Home Health | Payer: Medicare Other | Attending: Family Medicine | Admitting: Family Medicine

## 2020-10-07 VITALS — BP 139/90 | HR 113 | Temp 100.4°F | Resp 16 | Ht 69.0 in | Wt 137.8 lb

## 2020-10-07 DIAGNOSIS — L89151 Pressure ulcer of sacral region, stage 1: Secondary | ICD-10-CM | POA: Diagnosis present

## 2020-10-07 DIAGNOSIS — C9001 Multiple myeloma in remission: Secondary | ICD-10-CM | POA: Diagnosis present

## 2020-10-07 DIAGNOSIS — R0602 Shortness of breath: Secondary | ICD-10-CM

## 2020-10-07 DIAGNOSIS — E1165 Type 2 diabetes mellitus with hyperglycemia: Secondary | ICD-10-CM | POA: Diagnosis present

## 2020-10-07 DIAGNOSIS — I5042 Chronic combined systolic (congestive) and diastolic (congestive) heart failure: Secondary | ICD-10-CM | POA: Diagnosis present

## 2020-10-07 DIAGNOSIS — E872 Acidosis: Secondary | ICD-10-CM | POA: Diagnosis present

## 2020-10-07 DIAGNOSIS — Z951 Presence of aortocoronary bypass graft: Secondary | ICD-10-CM

## 2020-10-07 DIAGNOSIS — E785 Hyperlipidemia, unspecified: Secondary | ICD-10-CM | POA: Diagnosis present

## 2020-10-07 DIAGNOSIS — E860 Dehydration: Secondary | ICD-10-CM | POA: Diagnosis present

## 2020-10-07 DIAGNOSIS — A4101 Sepsis due to Methicillin susceptible Staphylococcus aureus: Principal | ICD-10-CM | POA: Diagnosis present

## 2020-10-07 DIAGNOSIS — E1151 Type 2 diabetes mellitus with diabetic peripheral angiopathy without gangrene: Secondary | ICD-10-CM | POA: Diagnosis present

## 2020-10-07 DIAGNOSIS — N136 Pyonephrosis: Secondary | ICD-10-CM | POA: Diagnosis present

## 2020-10-07 DIAGNOSIS — N390 Urinary tract infection, site not specified: Secondary | ICD-10-CM | POA: Diagnosis not present

## 2020-10-07 DIAGNOSIS — Z7901 Long term (current) use of anticoagulants: Secondary | ICD-10-CM

## 2020-10-07 DIAGNOSIS — Z833 Family history of diabetes mellitus: Secondary | ICD-10-CM

## 2020-10-07 DIAGNOSIS — N329 Bladder disorder, unspecified: Secondary | ICD-10-CM | POA: Diagnosis present

## 2020-10-07 DIAGNOSIS — I10 Essential (primary) hypertension: Secondary | ICD-10-CM | POA: Diagnosis not present

## 2020-10-07 DIAGNOSIS — I420 Dilated cardiomyopathy: Secondary | ICD-10-CM | POA: Diagnosis present

## 2020-10-07 DIAGNOSIS — N1339 Other hydronephrosis: Secondary | ICD-10-CM | POA: Diagnosis not present

## 2020-10-07 DIAGNOSIS — I2693 Single subsegmental pulmonary embolism without acute cor pulmonale: Secondary | ICD-10-CM | POA: Diagnosis not present

## 2020-10-07 DIAGNOSIS — A419 Sepsis, unspecified organism: Secondary | ICD-10-CM

## 2020-10-07 DIAGNOSIS — Z5112 Encounter for antineoplastic immunotherapy: Secondary | ICD-10-CM

## 2020-10-07 DIAGNOSIS — Z20822 Contact with and (suspected) exposure to covid-19: Secondary | ICD-10-CM | POA: Diagnosis present

## 2020-10-07 DIAGNOSIS — E43 Unspecified severe protein-calorie malnutrition: Secondary | ICD-10-CM | POA: Diagnosis not present

## 2020-10-07 DIAGNOSIS — D500 Iron deficiency anemia secondary to blood loss (chronic): Secondary | ICD-10-CM | POA: Diagnosis present

## 2020-10-07 DIAGNOSIS — E1122 Type 2 diabetes mellitus with diabetic chronic kidney disease: Secondary | ICD-10-CM | POA: Diagnosis present

## 2020-10-07 DIAGNOSIS — M1712 Unilateral primary osteoarthritis, left knee: Secondary | ICD-10-CM | POA: Diagnosis present

## 2020-10-07 DIAGNOSIS — Z79899 Other long term (current) drug therapy: Secondary | ICD-10-CM

## 2020-10-07 DIAGNOSIS — D631 Anemia in chronic kidney disease: Secondary | ICD-10-CM | POA: Diagnosis present

## 2020-10-07 DIAGNOSIS — Z7952 Long term (current) use of systemic steroids: Secondary | ICD-10-CM

## 2020-10-07 DIAGNOSIS — Z515 Encounter for palliative care: Secondary | ICD-10-CM | POA: Diagnosis not present

## 2020-10-07 DIAGNOSIS — I2510 Atherosclerotic heart disease of native coronary artery without angina pectoris: Secondary | ICD-10-CM | POA: Diagnosis present

## 2020-10-07 DIAGNOSIS — R6520 Severe sepsis without septic shock: Secondary | ICD-10-CM | POA: Diagnosis present

## 2020-10-07 DIAGNOSIS — R627 Adult failure to thrive: Secondary | ICD-10-CM | POA: Diagnosis present

## 2020-10-07 DIAGNOSIS — K5900 Constipation, unspecified: Secondary | ICD-10-CM | POA: Diagnosis present

## 2020-10-07 DIAGNOSIS — C9000 Multiple myeloma not having achieved remission: Secondary | ICD-10-CM | POA: Diagnosis present

## 2020-10-07 DIAGNOSIS — N3289 Other specified disorders of bladder: Secondary | ICD-10-CM

## 2020-10-07 DIAGNOSIS — M069 Rheumatoid arthritis, unspecified: Secondary | ICD-10-CM | POA: Diagnosis present

## 2020-10-07 DIAGNOSIS — E876 Hypokalemia: Secondary | ICD-10-CM | POA: Diagnosis present

## 2020-10-07 DIAGNOSIS — N184 Chronic kidney disease, stage 4 (severe): Secondary | ICD-10-CM | POA: Diagnosis present

## 2020-10-07 DIAGNOSIS — I2699 Other pulmonary embolism without acute cor pulmonale: Secondary | ICD-10-CM | POA: Diagnosis present

## 2020-10-07 DIAGNOSIS — Z86711 Personal history of pulmonary embolism: Secondary | ICD-10-CM

## 2020-10-07 DIAGNOSIS — L8990 Pressure ulcer of unspecified site, unspecified stage: Secondary | ICD-10-CM | POA: Insufficient documentation

## 2020-10-07 DIAGNOSIS — I130 Hypertensive heart and chronic kidney disease with heart failure and stage 1 through stage 4 chronic kidney disease, or unspecified chronic kidney disease: Secondary | ICD-10-CM | POA: Diagnosis present

## 2020-10-07 DIAGNOSIS — Z7189 Other specified counseling: Secondary | ICD-10-CM | POA: Diagnosis not present

## 2020-10-07 DIAGNOSIS — Z7982 Long term (current) use of aspirin: Secondary | ICD-10-CM

## 2020-10-07 DIAGNOSIS — Z888 Allergy status to other drugs, medicaments and biological substances status: Secondary | ICD-10-CM

## 2020-10-07 DIAGNOSIS — Z8249 Family history of ischemic heart disease and other diseases of the circulatory system: Secondary | ICD-10-CM

## 2020-10-07 DIAGNOSIS — R531 Weakness: Secondary | ICD-10-CM | POA: Diagnosis not present

## 2020-10-07 LAB — CBC WITH DIFFERENTIAL/PLATELET
Abs Immature Granulocytes: 0.12 10*3/uL — ABNORMAL HIGH (ref 0.00–0.07)
Basophils Absolute: 0 10*3/uL (ref 0.0–0.1)
Basophils Relative: 0 %
Eosinophils Absolute: 0 10*3/uL (ref 0.0–0.5)
Eosinophils Relative: 0 %
HCT: 28.5 % — ABNORMAL LOW (ref 39.0–52.0)
Hemoglobin: 9.1 g/dL — ABNORMAL LOW (ref 13.0–17.0)
Immature Granulocytes: 1 %
Lymphocytes Relative: 8 %
Lymphs Abs: 0.9 10*3/uL (ref 0.7–4.0)
MCH: 26.6 pg (ref 26.0–34.0)
MCHC: 31.9 g/dL (ref 30.0–36.0)
MCV: 83.3 fL (ref 80.0–100.0)
Monocytes Absolute: 0.8 10*3/uL (ref 0.1–1.0)
Monocytes Relative: 7 %
Neutro Abs: 10.4 10*3/uL — ABNORMAL HIGH (ref 1.7–7.7)
Neutrophils Relative %: 84 %
Platelets: 317 10*3/uL (ref 150–400)
RBC: 3.42 MIL/uL — ABNORMAL LOW (ref 4.22–5.81)
RDW: 15.4 % (ref 11.5–15.5)
WBC: 12.3 10*3/uL — ABNORMAL HIGH (ref 4.0–10.5)
nRBC: 0 % (ref 0.0–0.2)

## 2020-10-07 LAB — URINALYSIS, ROUTINE W REFLEX MICROSCOPIC
Bilirubin Urine: NEGATIVE
Glucose, UA: NEGATIVE mg/dL
Ketones, ur: NEGATIVE mg/dL
Nitrite: NEGATIVE
Protein, ur: 100 mg/dL — AB
Specific Gravity, Urine: 1.009 (ref 1.005–1.030)
WBC, UA: >50 WBC/hpf — ABNORMAL HIGH (ref 0–5)
pH: 7 (ref 5.0–8.0)

## 2020-10-07 LAB — CMP (CANCER CENTER ONLY)
ALT: 16 U/L (ref 0–44)
AST: 19 U/L (ref 15–41)
Albumin: 2.5 g/dL — ABNORMAL LOW (ref 3.5–5.0)
Alkaline Phosphatase: 82 U/L (ref 38–126)
Anion gap: 15 (ref 5–15)
BUN: 35 mg/dL — ABNORMAL HIGH (ref 8–23)
CO2: 14 mmol/L — ABNORMAL LOW (ref 22–32)
Calcium: 9.2 mg/dL (ref 8.9–10.3)
Chloride: 103 mmol/L (ref 98–111)
Creatinine: 2.81 mg/dL — ABNORMAL HIGH (ref 0.61–1.24)
GFR, Estimated: 24 mL/min — ABNORMAL LOW (ref >60)
Glucose, Bld: 175 mg/dL — ABNORMAL HIGH (ref 70–99)
Potassium: 3.7 mmol/L (ref 3.5–5.1)
Sodium: 132 mmol/L — ABNORMAL LOW (ref 135–145)
Total Bilirubin: 0.5 mg/dL (ref 0.3–1.2)
Total Protein: 7.2 g/dL (ref 6.5–8.1)

## 2020-10-07 LAB — TSH: TSH: 0.916 u[IU]/mL (ref 0.350–4.500)

## 2020-10-07 LAB — RESP PANEL BY RT-PCR (FLU A&B, COVID) ARPGX2
Influenza A by PCR: NEGATIVE
Influenza B by PCR: NEGATIVE
SARS Coronavirus 2 by RT PCR: NEGATIVE

## 2020-10-07 LAB — PROCALCITONIN: Procalcitonin: 1.34 ng/mL

## 2020-10-07 LAB — LACTIC ACID, PLASMA
Lactic Acid, Venous: 1.9 mmol/L (ref 0.5–1.9)
Lactic Acid, Venous: 1.3 mmol/L (ref 0.5–1.9)

## 2020-10-07 MED ORDER — ADULT MULTIVITAMIN W/MINERALS CH
1.0000 | ORAL_TABLET | Freq: Every day | ORAL | Status: DC
  Administered 2020-10-07 – 2020-10-11 (×3): 1 via ORAL
  Filled 2020-10-07 (×5): qty 1

## 2020-10-07 MED ORDER — ACETAMINOPHEN 325 MG PO TABS
650.0000 mg | ORAL_TABLET | Freq: Once | ORAL | Status: AC
  Administered 2020-10-07: 650 mg via ORAL
  Filled 2020-10-07: qty 2

## 2020-10-07 MED ORDER — VANCOMYCIN HCL IN DEXTROSE 1-5 GM/200ML-% IV SOLN
1000.0000 mg | Freq: Once | INTRAVENOUS | Status: AC
  Administered 2020-10-07: 1000 mg via INTRAVENOUS
  Filled 2020-10-07: qty 200

## 2020-10-07 MED ORDER — VANCOMYCIN HCL 1250 MG/250ML IV SOLN: 1250.0000 mg | INTRAVENOUS | Status: DC

## 2020-10-07 MED ORDER — SODIUM CHLORIDE 0.9% FLUSH
3.0000 mL | Freq: Two times a day (BID) | INTRAVENOUS | Status: DC
  Administered 2020-10-07 – 2020-10-11 (×8): 3 mL via INTRAVENOUS

## 2020-10-07 MED ORDER — VANCOMYCIN HCL IN DEXTROSE 1-5 GM/200ML-% IV SOLN: 1000.0000 mg | Freq: Once | INTRAVENOUS | Status: DC

## 2020-10-07 MED ORDER — SODIUM CHLORIDE 0.9 % IV SOLN
2.0000 g | Freq: Every day | INTRAVENOUS | Status: DC
  Administered 2020-10-07 – 2020-10-08 (×2): 2 g via INTRAVENOUS
  Filled 2020-10-07 (×3): qty 2

## 2020-10-07 MED ORDER — LACTATED RINGERS IV SOLN: INTRAVENOUS | Status: AC

## 2020-10-07 MED ORDER — LACTATED RINGERS IV SOLN: INTRAVENOUS | Status: DC

## 2020-10-07 MED ORDER — VANCOMYCIN HCL 1250 MG/250ML IV SOLN
1250.0000 mg | INTRAVENOUS | Status: DC
  Administered 2020-10-08: 1250 mg via INTRAVENOUS
  Filled 2020-10-07: qty 250

## 2020-10-07 MED ORDER — HEPARIN SODIUM (PORCINE) 5000 UNIT/ML IJ SOLN
5000.0000 [IU] | Freq: Three times a day (TID) | INTRAMUSCULAR | Status: DC
  Administered 2020-10-07 – 2020-10-08 (×3): 5000 [IU] via SUBCUTANEOUS
  Filled 2020-10-07 (×3): qty 1

## 2020-10-07 MED ORDER — LACTATED RINGERS IV BOLUS (SEPSIS)
500.0000 mL | Freq: Once | INTRAVENOUS | Status: AC
  Administered 2020-10-07: 500 mL via INTRAVENOUS

## 2020-10-07 MED ORDER — METRONIDAZOLE IN NACL 5-0.79 MG/ML-% IV SOLN
500.0000 mg | Freq: Three times a day (TID) | INTRAVENOUS | Status: DC
  Administered 2020-10-07 – 2020-10-09 (×6): 500 mg via INTRAVENOUS
  Filled 2020-10-07 (×6): qty 100

## 2020-10-07 MED ORDER — LACTATED RINGERS IV BOLUS
1000.0000 mL | Freq: Once | INTRAVENOUS | Status: AC
  Administered 2020-10-07: 1000 mL via INTRAVENOUS

## 2020-10-07 NOTE — ED Triage Notes (Addendum)
Pt comes in from the cancer center. Labs drawn this morning. Pt's cancer is currently in remission. Wife states the pt has lost 15 pounds in the past 2 weeks and is not eating or drinking. Wife reports pt has brown urine.

## 2020-10-07 NOTE — Progress Notes (Signed)
Per Dr. Irene Limbo verbal order - patient taken in wheelchair to [facility] ED, rm 23 - arrived ED 1150 . Patient wife accompanied patient.  Patient alert, oriented and cooperative. MD concerns and request for ED assessment given to Ronnell Gu[place], RN.

## 2020-10-07 NOTE — H&P (Addendum)
History and Physical        Hospital Admission Note Date: 10/07/2020  Patient name: [name] Medical record number: [id number] Date of birth: 1956-06-15 Age: 65 y.o. Gender: male  PCP: Isaac Bliss, Rayford Halsted, MD  Patient coming from: home Lives with: wife At baseline, ambulates: cane  Chief Complaint    Chief Complaint  Patient presents with  . Dehydration      HPI:   This is a 65 year old male with past medical history of multiple myeloma (in remission, follows with Dr. Irene Limbo), hypertension, hyperlipidemia, diabetes, RA, CKD 3b, combined systolic and diastolic CHF, PE who presented to the ED with poor p.o. intake for several weeks and 15 pound weight loss in 13 days.  He was seen by his oncologist today who noted that he will eat one day and not eat for the next 2 days or so and recommended direct admission to [facility] for failure to thrive.  Due to patient's lack of appetite he has not been taking any of his medications for several days.  He is unclear of what medications he is on currently as he was told to stop taking some but not sure which ones.  He denies any known fever, chills, night sweats, nausea, vomiting, shortness of breath, rashes, dysuria or hematuria.  Admits to some constipation.   ED Course: Febrile, tachycardic, tachypneic,  hemodynamically stable, on room air. Notable Labs: Sodium 132, K3.7, CO2 14, glucose 175, BUN 35, creatinine 2.1, albumin 2.5, WBC 12.3, Hb 9.1, platelets 317, lactic acid 1.9, COVID-19 and flu negative. Notable Imaging: CXR-mild cardiomegaly without acute abnormality. Patient received 1 L LR bolus with maintenance fluid, Tylenol.    Vitals:   10/07/20 1530 10/07/20 1645  BP: 109/65 92/61  Pulse: 88 90  Resp: (!) 27 (!) 21  Temp:  97.8 F (36.6 C)  SpO2: 99% 98%     Review of Systems:  Review of Systems  All other systems  reviewed and are negative.   Medical/Social/Family History   Past Medical History: Past Medical History:  Diagnosis Date  . Anemia   . Arthritis    "left knee" (11/24/2017)  . CAD (coronary artery disease)    CABG 2002  . Cancer [facility])    Multiple Myeloma  . CHF (congestive heart failure) ([place])   . Chronic kidney disease   . Chronic renal insufficiency   . Diabetes mellitus without complication ([place])   . Dilated cardiomyopathy ([place])    echo in 2009 showed improvement with a normal EF  . HTN (hypertension)   . Hyperlipemia   . Hypertension   . Noncompliance   . PAD (peripheral artery disease) ([place])   . Pneumonia     Past Surgical History:  Procedure Laterality Date  . CARDIAC CATHETERIZATION  2002, 2006  . CORONARY ARTERY BYPASS GRAFT  2002   x4 DR. VAN TRIGT  . IR FLUORO GUIDED NEEDLE PLC ASPIRATION/INJECTION LOC  10/24/2019    Medications: Prior to Admission medications   Medication Sig Start Date End Date Taking? Authorizing Provider  Accu-Chek Softclix Lancets lancets Use Accu Chek softclix lancets to check blood sugar fasting or 2 hours after a meal every other day. 11/23/19  Elayne Snare, MD  acyclovir (ZOVIRAX) 400 MG tablet Take 0.5 tablets (200 mg total) by mouth 2 (two) times daily. 11/06/19   Brunetta Genera, MD  amLODipine (NORVASC) 5 MG tablet Take 5 mg by mouth daily. 01/09/20   [provider]  apixaban (ELIQUIS) 2.5 MG TABS tablet Take 1 tablet (2.5 mg total) by mouth 2 (two) times daily. 07/02/20   Brunetta Genera, MD  atorvastatin (LIPITOR) 80 MG tablet Take 1 tablet by mouth once daily 06/25/20   Martin[place], Peter M, MD  Blood Glucose Monitoring Suppl (ACCU-CHEK GUIDE ME) w/Device KIT 1 each by Does not apply route.    [provider]  carvedilol (COREG) 25 MG tablet Take 25 mg by mouth 2 (two) times daily with a meal.    [provider]  dexamethasone (DECADRON) 4 MG tablet Take 12 mg by mouth See admin instructions. Take  after chemo treatments    [provider]  glucose blood (ACCU-CHEK GUIDE) test strip Use Accu Chek test strips as instructed to check blood sugar fasting or two hours after meals, every other day. 08/18/19   Elayne Snare, MD  hydrALAZINE (APRESOLINE) 50 MG tablet TAKE 1 TABLET BY MOUTH THREE TIMES DAILY 06/25/20   [place], Peter M, MD  isosorbide mononitrate (IMDUR) 30 MG 24 hr tablet Take 1 tablet (30 mg total) by mouth daily. 04/10/20   Burtis Junes, NP  losartan (COZAAR) 100 MG tablet Take 1 tablet by mouth once daily 07/29/20   Burtis Junes, NP  potassium chloride SA (KLOR-CON) 20 MEQ tablet Take 1 tablet by mouth once daily 05/13/20   Elayne Snare, MD  prochlorperazine (COMPAZINE) 10 MG tablet Take 1 tablet (10 mg total) by mouth every 6 (six) hours as needed (Nausea or vomiting). 11/06/19   Brunetta Genera, MD  repaglinide (PRANDIN) 1 MG tablet TAKE 1 TABLET BY MOUTH THREE TIMES DAILY BEFORE MEAL(S) 08/30/20   Elayne Snare, MD  senna-docusate (SENNA S) 8.6-50 MG tablet Take 2 tablets by mouth at bedtime. 07/15/20   Brunetta Genera, MD    Allergies:   Allergies  Allergen Reactions  . Metformin And Related Diarrhea    Social History:  reports that he has never smoked. He has never used smokeless tobacco. He reports previous alcohol use. He reports previous drug use.  Family History: Family History  Problem Relation Age of Onset  . Hypertension Father   . Diabetes Mother        DIABETIC COMA  . Healthy Daughter      Objective   Physical Exam: Blood pressure 92/61, pulse 90, temperature 97.8 F (36.6 C), temperature source Oral, resp. rate (!) 21, SpO2 98 %.  Physical Exam Vitals and nursing note reviewed. Exam conducted with a chaperone present.  Constitutional:      General: He is not in acute distress.    Comments: Frail, cachectic elderly male appears older than stated age.  Responds to questioning but slow  Eyes:     General: No scleral icterus.     Conjunctiva/sclera: Conjunctivae normal.  Cardiovascular:     Rate and Rhythm: Normal rate.     Heart sounds: No murmur heard.     Comments: Occasional extra beats Pulmonary:     Effort: Pulmonary effort is normal.     Breath sounds: Normal breath sounds.  Abdominal:     General: There is no distension.     Palpations: There is mass.     Tenderness: There  is abdominal tenderness.     Comments: Lower abdominal tenderness to palpation  Musculoskeletal:        General: No swelling or tenderness.     Comments: No lesions or signs of infection on feet  Skin:    General: Skin is warm.     Coloration: Skin is not jaundiced.  Neurological:     Mental Status: He is alert. Mental status is at baseline.  Psychiatric:        Mood and Affect: Mood normal.        Behavior: Behavior normal.     LABS on Admission: I have personally reviewed all the labs and imaging below    Basic Metabolic Panel: Recent Labs  Lab 10/07/20 1000  NA 132*  K 3.7  CL 103  CO2 14*  GLUCOSE 175*  BUN 35*  CREATININE 2.81*  CALCIUM 9.2   Liver Function Tests: Recent Labs  Lab 10/07/20 1000  AST 19  ALT 16  ALKPHOS 82  BILITOT 0.5  PROT 7.2  ALBUMIN 2.5*   No results for input(s): LIPASE, AMYLASE in the last 168 hours. No results for input(s): AMMONIA in the last 168 hours. CBC: Recent Labs  Lab 10/07/20 0919  WBC 12.3*  NEUTROABS 10.4*  HGB 9.1*  HCT 28.5*  MCV 83.3  PLT 317   Cardiac Enzymes: No results for input(s): CKTOTAL, CKMB, CKMBINDEX, TROPONINI in the last 168 hours. BNP: Invalid input(s): POCBNP CBG: No results for input(s): GLUCAP in the last 168 hours.  Radiological Exams on Admission:  DG Chest Port 1 View  Result Date: 10/07/2020 CLINICAL DATA:  Cough EXAM: PORTABLE CHEST 1 VIEW COMPARISON:  05/24/2020 FINDINGS: Mild cardiomegaly status post median sternotomy. Both lungs are clear. The visualized skeletal structures are unremarkable. IMPRESSION: Mild cardiomegaly  without acute abnormality of the lungs in AP portable projection. Electronically Signed   By: Eddie Candle M.D.   On: 10/07/2020 13:16      EKG: Sinus tachycardia, PAC's noted, occasional PVC noted, Q waves in anterior leads   A & P   Principal Problem:   Failure to thrive in adult Active Problems:   Uncontrolled type 2 diabetes mellitus with hyperglycemia ([place])   Multiple myeloma ([place])   Sepsis ([place])   Pulmonary embolism ([place])   Hypertension   1. Sepsis without septic shock of unclear etiology a. Sepsis criteria fever, tachycardia, tachypnea, leukocytosis b. IV fluids per sepsis protocol c. Broad-spectrum antibiotics with vancomycin, cefepime, metronidazole d. Follow-up blood cultures e. Check UA  2. Failure to thrive a. SLP eval b. Dietitian eval c. Palliative care consult  3. Multiple myeloma a. In remission per oncology today  4. Abdominal pain a. Noted on exam b. CT abdomen pelvis without contrast due to renal function to evaluate for source of sepsis i. Addendum: Discussed with radiology. abnormal CT with large mass overlying bladder and possibly affecting the colon. Bilateral hydronephrosis. Possible pneumonia as well. Will consult oncology and place foley. See CT for details  5. 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 Able to make urine b. Creatinine has been slowly worsening for the past several months and is currently 2.81 (2.58 on 3/14) c. Gentle IV fluids for hydration  6. Hypertension, currently soft BP a. Holding home antihypertensives  7. History of pulmonary embolism a. Continue home renally dosed Eliquis and hold aspirin  8. Diabetes a. Very sensitive sliding scale  9. Chronic systolic and diastolic CHF, not in acute exacerbation a. Holding home meds  due to low BP b. Monitor for volume overload after getting IV fluids  10. Rheumatoid arthritis a. Does not appear to be in an acute flare as he is not having any joint pain    DVT  prophylaxis: Eliquis   Code Status: Full Code  Diet: Heart healthy carb modified, pending SLP eval Family Communication: Admission, patients condition and plan of care including tests being ordered have been discussed with the patient who indicates understanding and agrees with the plan and Code Status. Patient's wife was updated  Disposition Plan: The appropriate patient status for this patient is INPATIENT. Inpatient status is judged to be reasonable and necessary in order to provide the required intensity of service to ensure the patient's safety. The patient's presenting symptoms, physical exam findings, and initial radiographic and laboratory data in the context of their chronic comorbidities is felt to place them at high risk for further clinical deterioration. Furthermore, it is not anticipated that the patient will be medically stable for discharge from the hospital within 2 midnights of admission. The following factors support the patient status of inpatient.   " The patient's presenting symptoms include failure to thrive. " The worrisome physical exam findings include cachectic male. " The initial radiographic and laboratory data are worrisome because of sepsis. " The chronic co-morbidities include as above   * I certify that at the point of admission it is my clinical judgment that the patient will require inpatient hospital care spanning beyond 2 midnights from the point of admission due to high intensity of service, high risk for further deterioration and high frequency of surveillance required.*     Consultants  . Palliative care  Procedures  . None  Time Spent on Admission: 66 minutes    Harold Hedge, DO [facility]  10/07/2020, 5:32 PM

## 2020-10-07 NOTE — ED Notes (Signed)
Patient's bladder scanned, 565 mL greatest volume observed, RN made aware.

## 2020-10-07 NOTE — ED Provider Notes (Signed)
[place] DEPT Provider Note   CSN: [id number] Arrival date & time: 10/07/20  1146     History Chief Complaint  Patient presents with  . Dehydration    [name] is a 65 y.o. male.  65 year old male with history multiple myeloma presents with several weeks of decreased oral intake.  Denies any emesis.  Has had increased whole body weakness.  Slight cough without urinary symptoms.  Seen by his physician today at the cancer center.  Laboratory studies were performed and shows worsening renal function as well as increasing dehydration.  Patient sent here for admission for failure to thrive        Past Medical History:  Diagnosis Date  . Anemia   . Arthritis    "left knee" (11/24/2017)  . CAD (coronary artery disease)    CABG 2002  . Cancer [facility])    Multiple Myeloma  . CHF (congestive heart failure) ([place])   . Chronic kidney disease   . Chronic renal insufficiency   . Diabetes mellitus without complication ([place])   . Dilated cardiomyopathy ([place])    echo in 2009 showed improvement with a normal EF  . HTN (hypertension)   . Hyperlipemia   . Hypertension   . Noncompliance   . PAD (peripheral artery disease) ([place])   . Pneumonia     Patient Active Problem List   Diagnosis Date Noted  . Flu vaccine need 06/04/2020  . Iron deficiency anemia 06/04/2020  . Rheumatoid arthritis involving multiple sites ([place]) 04/17/2020  . Aspiration pneumonia ([place]) 03/13/2020  . Severe sepsis ([place]) 03/13/2020  . Pulmonary embolism ([place]) 03/13/2020  . AKI (acute kidney injury) ([place]) 03/13/2020  . Anemia 03/13/2020  . Multiple myeloma ([place]) 03/13/2020  . Hypertension 03/13/2020  . Acute hypoxemic respiratory failure ([place]) 03/13/2020  . Multiple myeloma ([place]) 11/06/2019  . Counseling regarding advance care planning and goals of care 11/06/2019  . Uncontrolled type 2 diabetes mellitus with hyperglycemia ([place]) 09/19/2018  . Acute on chronic combined  systolic and diastolic CHF (congestive heart failure) ([place]) 11/26/2017  . Hyperlipemia 01/07/2011  . CAD (coronary artery disease)   . Dilated cardiomyopathy ([place])   . HTN (hypertension)   . PAD (peripheral artery disease) ([place])   . Chronic renal insufficiency     Past Surgical History:  Procedure Laterality Date  . CARDIAC CATHETERIZATION  2002, 2006  . CORONARY ARTERY BYPASS GRAFT  2002   x4 DR. VAN TRIGT  . IR FLUORO GUIDED NEEDLE PLC ASPIRATION/INJECTION LOC  10/24/2019       Family History  Problem Relation Age of Onset  . Hypertension Father   . Diabetes Mother        DIABETIC COMA  . Healthy Daughter     Social History   Tobacco Use  . Smoking status: Never Smoker  . Smokeless tobacco: Never Used  Vaping Use  . Vaping Use: Never used  Substance Use Topics  . Alcohol use: Not Currently  . Drug use: Not Currently    Home Medications Prior to Admission medications   Medication Sig Start Date End Date Taking? Authorizing Provider  Accu-Chek Softclix Lancets lancets Use Accu Chek softclix lancets to check blood sugar fasting or 2 hours after a meal every other day. 11/23/19   Elayne Snare, MD  acyclovir (ZOVIRAX) 400 MG tablet Take 0.5 tablets (200 mg total) by mouth 2 (two) times daily. 11/06/19   Brunetta Genera, MD  amLODipine (NORVASC) 5 MG tablet Take 5  mg by mouth daily. 01/09/20   [provider]  apixaban (ELIQUIS) 2.5 MG TABS tablet Take 1 tablet (2.5 mg total) by mouth 2 (two) times daily. 07/02/20   Brunetta Genera, MD  atorvastatin (LIPITOR) 80 MG tablet Take 1 tablet by mouth once daily 06/25/20   [place], Peter M, MD  Blood Glucose Monitoring Suppl (ACCU-CHEK GUIDE ME) w/Device KIT 1 each by Does not apply route.    [provider]  carvedilol (COREG) 25 MG tablet Take 25 mg by mouth 2 (two) times daily with a meal.    [provider]  dexamethasone (DECADRON) 4 MG tablet Take 12 mg by mouth See admin instructions. Take  after chemo treatments    [provider]  glucose blood (ACCU-CHEK GUIDE) test strip Use Accu Chek test strips as instructed to check blood sugar fasting or two hours after meals, every other day. 08/18/19   Elayne Snare, MD  hydrALAZINE (APRESOLINE) 50 MG tablet TAKE 1 TABLET BY MOUTH THREE TIMES DAILY 06/25/20   [place], Peter M, MD  isosorbide mononitrate (IMDUR) 30 MG 24 hr tablet Take 1 tablet (30 mg total) by mouth daily. 04/10/20   Burtis Junes, NP  losartan (COZAAR) 100 MG tablet Take 1 tablet by mouth once daily 07/29/20   Burtis Junes, NP  potassium chloride SA (KLOR-CON) 20 MEQ tablet Take 1 tablet by mouth once daily 05/13/20   Elayne Snare, MD  prochlorperazine (COMPAZINE) 10 MG tablet Take 1 tablet (10 mg total) by mouth every 6 (six) hours as needed (Nausea or vomiting). 11/06/19   Brunetta Genera, MD  repaglinide (PRANDIN) 1 MG tablet TAKE 1 TABLET BY MOUTH THREE TIMES DAILY BEFORE MEAL(S) 08/30/20   Elayne Snare, MD  senna-docusate (SENNA S) 8.6-50 MG tablet Take 2 tablets by mouth at bedtime. 07/15/20   Brunetta Genera, MD    Allergies    Patient has no known allergies.  Review of Systems   Review of Systems  All other systems reviewed and are negative.   Physical Exam Updated Vital Signs BP 126/74   Pulse (!) 111   Temp 99.2 F (37.3 C) (Oral)   Resp 20   SpO2 99%   Physical Exam Vitals and nursing note reviewed.  Constitutional:      General: He is not in acute distress.    Appearance: Normal appearance. He is well-developed. He is not toxic-appearing.  HENT:     Head: Normocephalic and atraumatic.  Eyes:     General: Lids are normal.     Conjunctiva/sclera: Conjunctivae normal.     Pupils: Pupils are equal, round, and reactive to light.  Neck:     Thyroid: No thyroid mass.     Trachea: No tracheal deviation.  Cardiovascular:     Rate and Rhythm: Normal rate and regular rhythm.     Heart sounds: Normal heart sounds. No murmur heard. No  gallop.   Pulmonary:     Effort: Pulmonary effort is normal. No respiratory distress.     Breath sounds: Normal breath sounds. No stridor. No decreased breath sounds, wheezing, rhonchi or rales.  Abdominal:     General: Bowel sounds are normal. There is no distension.     Palpations: Abdomen is soft.     Tenderness: There is no abdominal tenderness. There is no rebound.  Musculoskeletal:        General: No tenderness. Normal range of motion.     Cervical back: Normal range of motion  and neck supple.  Skin:    General: Skin is warm and dry.     Findings: No abrasion or rash.  Neurological:     General: No focal deficit present.     Mental Status: He is alert and oriented to person, place, and time.     GCS: GCS eye subscore is 4. GCS verbal subscore is 5. GCS motor subscore is 6.     Cranial Nerves: No cranial nerve deficit.     Sensory: No sensory deficit.  Psychiatric:        Attention and Perception: Attention normal.        Mood and Affect: Affect is flat.        Speech: Speech is delayed.        Behavior: Behavior normal.     ED Results / Procedures / Treatments   Labs (all labs ordered are listed, but only abnormal results are displayed) Labs Reviewed  CULTURE, BLOOD (ROUTINE X 2)  CULTURE, BLOOD (ROUTINE X 2)  URINE CULTURE  RESP PANEL BY RT-PCR (FLU A&B, COVID) ARPGX2  LACTIC ACID, PLASMA  URINALYSIS, ROUTINE W REFLEX MICROSCOPIC    EKG EKG Interpretation  Date/Time:  Monday October 07 2020 12:32:27 EDT Ventricular Rate:  105 PR Interval:    QRS Duration: 79 QT Interval:  354 QTC Calculation: 468 R Axis:   -30 Text Interpretation: Sinus tachycardia Multiform ventricular premature complexes Probable left ventricular hypertrophy Anterior Q waves, possibly due to LVH Nonspecific T abnormalities, lateral leads Confirmed by Lacretia Leigh ([id number]) on 10/07/2020 2:21:13 PM   Radiology No results found.  Procedures Procedures   Medications Ordered in  ED Medications  lactated ringers infusion (has no administration in time range)  lactated ringers bolus 1,000 mL (has no administration in time range)    ED Course  I have reviewed the triage vital signs and the nursing notes.  Pertinent labs & imaging results that were available during my care of the patient were reviewed by me and considered in my medical decision making (see chart for details).    MDM Rules/Calculators/A&P                         Patient given IV fluids here. Patient's chest x-ray without infiltrate here.  Was noted to be febrile.  Will treat with Tylenol.  Urinalysis is pending at this time.  Blood cultures obtained.  Lactate within normal limits.  Plan will be to admit to the hospital service Final Clinical Impression(s) / ED Diagnoses Final diagnoses:  None    Rx / DC Orders ED Discharge Orders    None       Lacretia Leigh, MD 10/07/20 1447

## 2020-10-07 NOTE — Patient Instructions (Signed)

## 2020-10-07 NOTE — Progress Notes (Addendum)
Pharmacy Antibiotic Note  Kayveon Lennartz is a 65 y.o. male admitted on 10/07/2020 with sepsis. Pharmacy has been consulted for cefepime and vancomycin dosing.  Plan:  Vancomycin 1000 mg IV now, then 1250 mg IV q48 hr (est AUC 528 based on SCr 2.81; Vd 0.72)  Measure vancomycin AUC at steady state as indicated  SCr q24 while on vanc given poor baseline CrCl  Cefepime 2 g IV q24 hr      Temp (24hrs), Avg:100 F (37.8 C), Min:97.8 F (36.6 C), Max:102.5 F (39.2 C)  Recent Labs  Lab 10/07/20 0919 10/07/20 1000 10/07/20 1255 10/07/20 1504  WBC 12.3*  --   --   --   CREATININE  --  2.81*  --   --   LATICACIDVEN  --   --  1.9 1.3    Estimated Creatinine Clearance: 23.2 mL/min (A) (by C-G formula based on SCr of 2.81 mg/dL (H)).    Allergies  Allergen Reactions  . Metformin And Related Diarrhea    Antimicrobials this admission: 3/28 cefepime >>  3/28 vancomycin >>   Dose adjustments this admission: n/a  Microbiology results: 3/28 BCx: sent 3/28 UCx: sent   Thank you for allowing pharmacy to be a part of this patient's care.  Callum Wolf A 10/07/2020 5:12 PM

## 2020-10-08 ENCOUNTER — Telehealth: Payer: Self-pay | Admitting: Hematology

## 2020-10-08 ENCOUNTER — Other Ambulatory Visit: Payer: Medicare Other

## 2020-10-08 ENCOUNTER — Inpatient Hospital Stay: Payer: Medicare Other

## 2020-10-08 ENCOUNTER — Ambulatory Visit: Payer: Medicare Other

## 2020-10-08 ENCOUNTER — Ambulatory Visit: Payer: Medicare Other | Admitting: Hematology

## 2020-10-08 DIAGNOSIS — R627 Adult failure to thrive: Secondary | ICD-10-CM

## 2020-10-08 DIAGNOSIS — C9001 Multiple myeloma in remission: Secondary | ICD-10-CM | POA: Diagnosis not present

## 2020-10-08 DIAGNOSIS — N3289 Other specified disorders of bladder: Secondary | ICD-10-CM

## 2020-10-08 DIAGNOSIS — N1339 Other hydronephrosis: Secondary | ICD-10-CM

## 2020-10-08 DIAGNOSIS — N390 Urinary tract infection, site not specified: Secondary | ICD-10-CM

## 2020-10-08 LAB — COMPREHENSIVE METABOLIC PANEL
ALT: 11 U/L (ref 0–44)
AST: 12 U/L — ABNORMAL LOW (ref 15–41)
Albumin: 2.2 g/dL — ABNORMAL LOW (ref 3.5–5.0)
Alkaline Phosphatase: 55 U/L (ref 38–126)
Anion gap: 7 (ref 5–15)
BUN: 34 mg/dL — ABNORMAL HIGH (ref 8–23)
CO2: 16 mmol/L — ABNORMAL LOW (ref 22–32)
Calcium: 8.3 mg/dL — ABNORMAL LOW (ref 8.9–10.3)
Chloride: 115 mmol/L — ABNORMAL HIGH (ref 98–111)
Creatinine, Ser: 2.22 mg/dL — ABNORMAL HIGH (ref 0.61–1.24)
GFR, Estimated: 32 mL/min — ABNORMAL LOW (ref >60)
Glucose, Bld: 83 mg/dL (ref 70–99)
Potassium: 3.4 mmol/L — ABNORMAL LOW (ref 3.5–5.1)
Sodium: 138 mmol/L (ref 135–145)
Total Bilirubin: 0.8 mg/dL (ref 0.3–1.2)
Total Protein: 5.8 g/dL — ABNORMAL LOW (ref 6.5–8.1)

## 2020-10-08 LAB — GLUCOSE, CAPILLARY
Glucose-Capillary: 87 mg/dL (ref 70–99)
Glucose-Capillary: 86 mg/dL (ref 70–99)

## 2020-10-08 LAB — CBC
HCT: 24.1 % — ABNORMAL LOW (ref 39.0–52.0)
Hemoglobin: 7.8 g/dL — ABNORMAL LOW (ref 13.0–17.0)
MCH: 27.1 pg (ref 26.0–34.0)
MCHC: 32.4 g/dL (ref 30.0–36.0)
MCV: 83.7 fL (ref 80.0–100.0)
Platelets: 284 10*3/uL (ref 150–400)
RBC: 2.88 MIL/uL — ABNORMAL LOW (ref 4.22–5.81)
RDW: 15.6 % — ABNORMAL HIGH (ref 11.5–15.5)
WBC: 8 10*3/uL (ref 4.0–10.5)
nRBC: 0 % (ref 0.0–0.2)

## 2020-10-08 LAB — CREATININE, SERUM
Creatinine, Ser: 2.41 mg/dL — ABNORMAL HIGH (ref 0.61–1.24)
GFR, Estimated: 29 mL/min — ABNORMAL LOW (ref >60)

## 2020-10-08 LAB — MRSA PCR SCREENING: MRSA by PCR: NEGATIVE

## 2020-10-08 MED ORDER — APIXABAN 2.5 MG PO TABS
2.5000 mg | ORAL_TABLET | Freq: Two times a day (BID) | ORAL | Status: DC
  Administered 2020-10-08 – 2020-10-11 (×6): 2.5 mg via ORAL
  Filled 2020-10-08 (×6): qty 1

## 2020-10-08 MED ORDER — CHLORHEXIDINE GLUCONATE CLOTH 2 % EX PADS
6.0000 | MEDICATED_PAD | Freq: Every day | CUTANEOUS | Status: DC
  Administered 2020-10-09 – 2020-10-11 (×3): 6 via TOPICAL

## 2020-10-08 MED ORDER — INSULIN ASPART 100 UNIT/ML ~~LOC~~ SOLN: 0.0000 [IU] | Freq: Three times a day (TID) | SUBCUTANEOUS | Status: DC

## 2020-10-08 MED ORDER — TAMSULOSIN HCL 0.4 MG PO CAPS
0.4000 mg | ORAL_CAPSULE | Freq: Every day | ORAL | Status: DC
  Administered 2020-10-08 – 2020-10-11 (×4): 0.4 mg via ORAL
  Filled 2020-10-08 (×4): qty 1

## 2020-10-08 MED ORDER — POTASSIUM CHLORIDE CRYS ER 20 MEQ PO TBCR
40.0000 meq | EXTENDED_RELEASE_TABLET | Freq: Once | ORAL | Status: AC
  Administered 2020-10-08: 40 meq via ORAL
  Filled 2020-10-08: qty 2

## 2020-10-08 NOTE — ED Notes (Signed)
Pt in bed resting watching tv wife just left bedside. Respirations even and unlabored.

## 2020-10-08 NOTE — ED Notes (Signed)
Pt in bed resting, respirations even and unlabored. 

## 2020-10-08 NOTE — Evaluation (Signed)
SLP Cancellation Note  Patient Details Name: [name] MRN: [id number] DOB: 1956/01/30   Cancelled treatment:       Reason Eval/Treat Not Completed: Other (comment) (pt was prepping for tx for floor from ED earlier this date, will reattempt 3/30)   Macario Golds 10/08/2020, 6:49 PM   Kathleen Lime, MS [facility] SLP [place] Office [phone] Pager [phone]

## 2020-10-08 NOTE — Progress Notes (Addendum)
HEMATOLOGY-ONCOLOGY PROGRESS NOTE  SUBJECTIVE: [name] is currently followed by our office for multiple myeloma.  Based on labs from February 2022, he has officially entered remission.  He is receiving maintenance daratumumab every 2 weeks.  The patient was seen yesterday for routine follow-up.  The patient was not eating or drinking and had a weight loss of 15 pounds in 13 days.  Due to his symptoms, he was referred to the emergency room for further evaluation.  Labs showed a WBC of 12.3, hemoglobin 9.1, sodium 132, BUN 35, creatinine 2.81, albumin 2.5.  CT of the abdomen/pelvis showed severe bilateral hydroureteronephrosis and an irregularly thickened, lobular urinary bladder with marked irregular mural thickening as well as more masslike extension from the bladder dome which closely apposes several adjacent loops of thickened small bowel and colon with loss of discernible fat plane.  He also had an enlarged prostate with median lobe hypertrophy indenting the bladder base.  The patient met criteria on admission and was started on IV fluids and broad-spectrum antibiotics.  He was pancultured.  A dietitian and palliative care consult have been ordered.  Urology has been consulted due to the bladder mass and bilateral hydroureteronephrosis.  The patient was seen in the emergency room today.  He has no specific complaints.  He denies having any recent hematuria.  He does report some urinary hesitancy.  He overall feels weak.  He denies headaches, dizziness, chest pain, shortness of breath.  Denies abdominal pain, nausea, vomiting.  Oncology History  Multiple myeloma (HCC)  11/06/2019 Initial Diagnosis   Multiple myeloma (HCC)   11/14/2019 -  Chemotherapy    Patient is on Treatment Plan: MYELOMA DARATUMUMAB Q28D         REVIEW OF SYSTEMS:   Constitutional: Denies fevers, chills.  15 pound weight loss over about 2 weeks. Eyes: Denies blurriness of vision Ears, nose, mouth, throat, and face:  Denies mucositis or sore throat Respiratory: Denies cough, dyspnea or wheezes Cardiovascular: Denies palpitation, chest discomfort Gastrointestinal:  Denies nausea, heartburn or change in bowel habits Skin: Denies abnormal skin rashes Lymphatics: Denies new lymphadenopathy or easy bruising Neurological:Denies numbness, tingling or new weaknesses Behavioral/Psych: Mood is stable, no new changes  Extremities: No lower extremity edema All other systems were reviewed with the patient and are negative.  I have reviewed the past medical history, past surgical history, social history and family history with the patient and they are unchanged from previous note.   PHYSICAL EXAMINATION: ECOG PERFORMANCE STATUS: 1 - Symptomatic but completely ambulatory  Vitals:   10/08/20 0400 10/08/20 0600  BP: 126/63 128/71  Pulse: 85 83  Resp: 20 17  Temp:    SpO2: 100% 100%   There were no vitals filed for this visit.  Intake/Output from previous day: 03/28 0701 - 03/29 0700 In: 350.6 [I.V.:150; IV Piggyback:200.6] Out: 2500 [Urine:2500]  GENERAL: Awake and alert, no distress SKIN: skin color, texture, turgor are normal, no rashes or significant lesions EYES: normal, Conjunctiva are pink and non-injected, sclera clear OROPHARYNX:no exudate, no erythema and lips, buccal mucosa, and tongue normal  LUNGS: clear to auscultation and percussion with normal breathing effort HEART: regular rate & rhythm and no murmurs and no lower extremity edema ABDOMEN:abdomen soft, non-tender and normal bowel sounds, fullness noted over bladder and he has some discomfort with light palpation. Musculoskeletal:no cyanosis of digits and no clubbing  NEURO: alert & oriented x 3 with fluent speech, no focal motor/sensory deficits  LABORATORY DATA:  I have reviewed the  data as listed CMP Latest Ref Rng & Units 10/08/2020 10/07/2020 09/23/2020  Glucose 70 - 99 mg/dL - 730(B) 208(N)  BUN 8 - 23 mg/dL - 58(I) 85(B)   Creatinine 0.61 - 1.24 mg/dL 8.79(P) 1.17(D) 9.40(T)  Sodium 135 - 145 mmol/L - 132(L) 138  Potassium 3.5 - 5.1 mmol/L - 3.7 4.0  Chloride 98 - 111 mmol/L - 103 110  CO2 22 - 32 mmol/L - 14(L) 16(L)  Calcium 8.9 - 10.3 mg/dL - 9.2 0.0(J)  Total Protein 6.5 - 8.1 g/dL - 7.2 6.6  Total Bilirubin 0.3 - 1.2 mg/dL - 0.5 0.3  Alkaline Phos 38 - 126 U/L - 82 81  AST 15 - 41 U/L - 19 6(L)  ALT 0 - 44 U/L - 16 <6    Lab Results  Component Value Date   WBC 12.3 (H) 10/07/2020   HGB 9.1 (L) 10/07/2020   HCT 28.5 (L) 10/07/2020   MCV 83.3 10/07/2020   PLT 317 10/07/2020   NEUTROABS 10.4 (H) 10/07/2020    CT ABDOMEN PELVIS WO CONTRAST  Result Date: 10/07/2020 CLINICAL DATA:  Abdominal pain, fever EXAM: CT ABDOMEN AND PELVIS WITHOUT CONTRAST TECHNIQUE: Multidetector CT imaging of the abdomen and pelvis was performed following the standard protocol without IV contrast. COMPARISON:  PET-CT 10/25/2019 FINDINGS: Lower chest: Airways thickening and scattered secretions in the lower lobe airways. Some additional mixed solid and ground-glass opacities present in the right lung base. Could reflect acute infection or inflammation or sequela of aspiration. Cardiomegaly. Hypoattenuation of the cardiac blood pool compatible with relative anemia. Three-vessel coronary artery atherosclerosis. No pericardial effusion. Hepatobiliary: No visible focal liver lesion. Smooth surface contour. Normal hepatic attenuation. Normal gallbladder and biliary tree without visible calcified gallstone. Pancreas: No pancreatic ductal dilatation or surrounding inflammatory changes. Spleen: Normal in size. No concerning splenic lesions. Adrenals/Urinary Tract: Adrenal. Previously seen left upper pole hyperdense cyst is no longer visible. No new or concerning focal renal lesions. Severe bilateral hydroureteronephrosis to the level of an irregularly thickened, lobular shaped urinary bladder with marked irregular mural thickening as well as  more masslike extension from the bladder dome C2 several adjacent loops small bowel and colon best seen coronal image 3/58. Additional layering density is seen within the bladder lumen. Indentation of bladder base by an enlarged prostate as well. Extensive surrounding inflammatory changes of the kidneys, ureters and urinary bladder. Stomach/Bowel: Distal esophagus, stomach and duodenum are unremarkable. No proximal small bowel thickening or dilatation. Portion of the small bowel closely apposing the irregular lobular extension of the bladder dome with loss of clearly discernible fat plane. No resulting obstruction. Similar site irregular fat plane loss noted in the descending and proximal sigmoid colon as well with some mild mural thickening of the adjacent colonic segment. Normal appendix. No bowel obstruction. Vascular/Lymphatic: Atherosclerotic calcifications within the abdominal aorta and branch vessels. No aneurysm or ectasia. No enlarged abdominopelvic lymph nodes. Reproductive: Enlarged prostate with median lobe hypertrophy indenting the bladder base. Other: Inflammatory changes centered upon the bladder, kidneys and ureters as above. No extraluminal fluid or gas is evident. No bowel containing hernia. Small fat containing umbilical and bilateral inguinal hernias. Musculoskeletal: The osseous structures appear diffusely demineralized which may limit detection of small or nondisplaced fractures. Transitional lumbosacral vertebrae partially fused with the adjacent sacrum and ilia. Bony fusion across the bilateral SI joints. Mild dextrocurvature of the spine apex at the L1 level. Additional multilevel degenerative changes in the hips and pelvis. No acute osseous abnormality or suspicious  osseous lesion. IMPRESSION: 1. Severe bilateral hydroureteronephrosis to the level of an irregularly thickened, lobular urinary bladder with marked irregular mural thickening as well as more masslike extension from the bladder  dome which closely apposes several adjacent loops of thickened small bowel and colon with loss of discernible fat plane. Overall appearance is new from comparison PET-CT 10/25/2019. Highly worrisome for a primary bladder malignancy with resulting obstructive uropathy and likely superimposed infection. Fistulization with the adjacent bowel loops is not completely excluded though less favored in the absence of intraluminal gas. Additional layering density is seen within the bladder lumen, could reflect blood products or proteinaceous debris. 2. Enlarged prostate with median lobe hypertrophy indenting the bladder base. 3. Airways thickening and scattered secretions in the lower lobe airways with additional mixed solid and ground-glass opacities in the right lung base, could reflect acute infection or inflammation or sequela of aspiration. 4. Aortic Atherosclerosis (ICD10-I70.0). These results were called by telephone at the time of interpretation on 10/07/2020 at 6:16 pm to provider [facility] , who verbally acknowledged these results. Electronically Signed   By: Kreg Shropshire M.D.   On: 10/07/2020 18:14   DG Chest Port 1 View  Result Date: 10/07/2020 CLINICAL DATA:  Cough EXAM: PORTABLE CHEST 1 VIEW COMPARISON:  05/24/2020 FINDINGS: Mild cardiomegaly status post median sternotomy. Both lungs are clear. The visualized skeletal structures are unremarkable. IMPRESSION: Mild cardiomegaly without acute abnormality of the lungs in AP portable projection. Electronically Signed   By: Lauralyn Primes M.D.   On: 10/07/2020 13:16    ASSESSMENT AND PLAN:  65 yo with   1) High risk Multiple myeloma -09/27/19 M Protein at 3.4 -10/25/2019 PET/CT ([id number]) revealed "1. No specific myelomatous lesions are identified. 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   2) Anemia ? Related to CKD vs ? Plasma cell dyscrasia  3) CKD ?etiology - HTN ? Cardiac issues ? Myeloma related.  4) recently diagnosed small Pulmonary embolism  5) Hypokalemia  6) bladder mass  7) severe bilateral hydroureteronephrosis  PLAN: -Lab work from today has been reviewed which shows hemoglobin is 7.8, potassium 3.4, BUN 34, creatinine 2.22, calcium 8.3, total protein 5.8, albumin 2.2, AST 12. -Discussed CT scan findings with the patient. -Recommend urology consult for evaluation of bladder mass and management of bilateral hydroureteronephrosis. -Based on last myeloma labs performed in February 2022, he is in remission from multiple myeloma standpoint. -He has been receiving maintenance daratumumab every 2 weeks. -Recommend PRBC transfusion for hemoglobin less than 7.5.  Recommend use of least incompatible blood. -On heparin for history of PE.  We can transition back to renal dosing of Eliquis once all procedures are completed.   LOS: 1 day   Clenton Pare, DNP, AGPCNP-BC, AOCNP 10/08/20   ADDENDUM  .Patient was Personally and independently interviewed, examined and relevant elements of the history of present illness were reviewed in details and an assessment and plan was created. All elements of the patient's history of present illness , assessment and  plan were discussed in details with Mikey Bussing, DNP, AGPCNP-BC, AOCNP. The above documentation reflects our combined findings assessment and plan.  Sullivan Lone MD MS

## 2020-10-08 NOTE — Telephone Encounter (Signed)
Checked out appointment. No LOS notes needing to be scheduled. No changes made. 

## 2020-10-08 NOTE — ED Notes (Signed)
Pt in bed eating breakfast no s/s of distress

## 2020-10-08 NOTE — Progress Notes (Addendum)
PROGRESS NOTE    [name]  TIR:[id number] DOB: 05/19/1956 DOA: 10/07/2020 PCP: Isaac Bliss, Rayford Halsted, MD   Brief Narrative:  Patient is a 65 year old African-American male with a past medical history significant for but not limited to multiple myeloma which is currently in remission followed by Dr. Irene Limbo, history of hypertension, hyperlipidemia, diabetes mellitus type 2, rheumatoid arthritis, chronic kidney disease stage IIIb, history of chronic combined systolic and diastolic CHF, history of PE on anticoagulation with apixaban as well as other comorbidities who presented to the ED with poor p.o. intake for last several weeks and had a 15 pound weight loss in the last 13 days.  He was seen by his oncologist who noted that the patient will eat 1 day and not eat for the next few days recommended and recommended to was for failure to thrive.  Due to the patient's lack of of appetite and not taking much of his medications f as he was unclear what medications he is currently on and he was told to stop taking some.  He presented the ED with dehydration and was noted to be febrile, tachycardic, tachypneic but hemodynamic stable on room air.  Further work-up revealed that he had a leukocytosis and chest x-ray showed mild cardiomegaly without any acute abnormality.  He received 1 L normal saline bolus and then ended up getting a CT of the abdomen pelvis for evaluation for sepsis without septic shock of unclear etiology.  He is also admitted for failure to thrive and abdominal pain and was noted to have significant hydroureteronephrosis and was noted to have an abnormal CT scan with large mass overlying the bladder and possibly affecting the colon.  Is also noted to have a pneumonia as well and medical oncology was consulted and a Foley catheter was placed.  Medical oncology recommended urology consultation I spoke with Dr. Gloriann Loan who will see the patient later today.  Currently he remains on  broad-spectrum antibiotics with IV vancomycin and IV cefepime but will stop the IV vancomycin if MRSA PCR is negative  Assessment & Plan:   Principal Problem:   Failure to thrive in adult Active Problems:   Uncontrolled type 2 diabetes mellitus with hyperglycemia (HCC)   Multiple myeloma (HCC)   Sepsis ([place])   Pulmonary embolism ([place])   Hypertension  Sepsis without septic shock of unclear etiology but most likely the urine and Pneumonia -Sepsis criteria fever, tachycardia, tachypnea, leukocytosis; patient's WBC has improved and gone from 12.3 and is now 8.0 -IV fluids per sepsis protocol -Patient's lactic acid level trended down from 1.9 is now 1.3 and procalcitonin level was elevated at 1.34 -Initiated on broad-spectrum antibiotics with vancomycin, cefepime, metronidazole -Follow-up blood cultures -Checked UA and this could be the source of his infection and showed a cloudy appearance with amber color urine, moderate hemoglobin, large leukocytes, negative nitrites, many bacteria, 0-5 non-squamous epithelial cells, 25-50 RBCs per high-power field, greater than 50 WBCs with urine culture pending -Continue monitor cultures and temperature curve -Repeat CBC in a.m. -MRSA PCR was negative so we will stop IV vancomycin and continue IV cefepime and IV metronidazole given his abdominal pain -CT Abd Pelvis below but did show "Airways thickening and scattered secretions in the lower lobe airways with additional mixed solid and ground-glass opacities in the right lung base, could reflect acute infection or inflammation or sequela of aspiration." -Of note SARS-CoV-2 testing and his respiratory virus panel was negative for influenza AMB via PCR  Failure to Thrive -SLP  eval; on a heart healthy carb modified diet for now -Dietitian eval -Palliative care consult -We will need PT OT to further evaluate and treat -IV fluid hydration is now stopped but will need to continue monitor carefully and use  fluids judiciously given his history of chronic systolic and diastolic CHF  Multiple Myeloma -In remission per oncology given his recent labs drawn in February 2022; Medical Oncology consulted and following  -He has been getting maintenance daratumumab every 2 weeks  Abdominal pain -Noted on exam -CT abdomen pelvis without contrast due to renal function to evaluate for source of sepsis; CT Can showed "Severe bilateral hydroureteronephrosis to the level of an irregularly thickened, lobular urinary ladder with marked irregular mural thickening as well as more masslike extension from the bladder dome which closely apposes several adjacent loops of thickened small bowel and colon with loss of discernible fat plane. Overall appearance is new from comparison PET-CT 10/25/2019. Highly worrisome for a primary bladder malignancy with resulting obstructive uropathy and likely superimposed infection. Fistulization with the adjacent bowel loops is not completely excluded though less favored in the absence of intraluminal gas. Additional layering density is seen within the bladder lumen, could reflect blood products or proteinaceous debris. 2. Enlarged prostate with median lobe hypertrophy indenting the bladder base. 3. Airways thickening and scattered secretions in the lower lobe airways with additional mixed solid and ground-glass opacities in the right lung base, could reflect acute infection or inflammation or sequela of aspiration." -Neurology consulted and Foley catheter in place and urology recommending tamsulosin 0.4 mg daily  CKD 3b, likely transitioning to CKD 4 Metabolic Acidosis -Able to make urine -Creatinine has been slowly worsening for the past several months and is currently 2.81 (2.58 on 3/14) -Gentle IV fluids for hydration initially but stopped due to concern for volume overload -Patient's BUNs/creatinine is improving and BUNs/creatinine went from 35/2.1 is now trended down to  34/2.22 -Patient's CO2 was 16, anion gap was 7, chloride level was 115 -Avoid nephrotoxic medications, contrast dyes, hypotension and renally dose medications -Continue to monitor trend and repeat CMP in a.m.  Hypokalemia -Patient's potassium went from 3.7 and then trended down to 3.4 -Replete with p.o. potassium chloride 40 mEq x 1 -Continue to Monitor and Replete as Necessary -Repeat CMP in the AM   Hypertension, currently soft BP -Holding home antihypertensives  History of Pulmonary Embolism -Continue home renally dosed Eliquis once all the procedures he is needed are completed and and hold aspirin; Now resumed Apixaban per Urology and Onc -Urology consulted for further evaluation recommendations  Diabetes Mellitus Type 2 -Very sensitive sliding scale -Continue to Monitor Blood Sugars Carefully   Chronic systolic and diastolic CHF, not in acute exacerbation -Holding home meds due to low BP -Monitor for volume overload after getting IV fluids -Strict I's and O's and Daily Weights -Appeared Euvolemic -Continue to Monitor for S/Sx of Volume Overload  Normocytic Anemia/Anemia of Chronic Disease -Patient's Hgb/Hct went from 9.1/28.5 -> 7.8/24.1 -Check Anemia Panel in the AM -Oncology recommending Transfusing pRBC's for a Hgb < 7.5 -Continue to Monitor for S/Sx of Bleeding; Currently no overt bleeding noted -Repeat CBC in the AM  Rheumatoid Arthritis -Does not appear to be in an acute flare as he is not having any joint pain  DVT prophylaxis: Apixaban Code Status: FULL CODE Family Communication: No family present at bedside Disposition Plan: Pending further workup and clearance by Oncology   Status is: Inpatient  Remains inpatient appropriate because:Unsafe d/c plan, IV treatments  appropriate due to intensity of illness or inability to take PO and Inpatient level of care appropriate due to severity of illness   Dispo: The patient is from: Home               Anticipated d/c is to: TBD              Patient currently is medically stable to d/c.   Difficult to place patient No  Consultants:   Urology  Medical Oncology   Procedures: None  Antimicrobials:  Anti-infectives (From admission, onward)   Start     Dose/Rate Route Frequency Ordered Stop   10/08/20 1030  vancomycin (VANCOREADY) IVPB 1250 mg/250 mL  Status:  Discontinued        1,250 mg 166.7 mL/hr over 90 Minutes Intravenous Every 48 hours 10/07/20 1648 10/08/20 1455   10/08/20 1000  vancomycin (VANCOREADY) IVPB 1250 mg/250 mL  Status:  Discontinued        1,250 mg 166.7 mL/hr over 90 Minutes Intravenous Every 48 hours 10/07/20 1629 10/07/20 1648   10/07/20 1800  metroNIDAZOLE (FLAGYL) IVPB 500 mg        500 mg 100 mL/hr over 60 Minutes Intravenous Every 8 hours 10/07/20 1611     10/07/20 1730  vancomycin (VANCOCIN) IVPB 1000 mg/200 mL premix        1,000 mg 200 mL/hr over 60 Minutes Intravenous  Once 10/07/20 1629 10/07/20 1815   10/07/20 1700  ceFEPIme (MAXIPIME) 2 g in sodium chloride 0.9 % 100 mL IVPB        2 g 200 mL/hr over 30 Minutes Intravenous Daily 10/07/20 1611     10/07/20 1615  vancomycin (VANCOCIN) IVPB 1000 mg/200 mL premix  Status:  Discontinued        1,000 mg 200 mL/hr over 60 Minutes Intravenous  Once 10/07/20 1611 10/07/20 1622        Subjective: And examined at bedside and states that he is hungry.  Was complaining of some abdominal pain lower abdomen.  Felt okay but was a little agitated.  No other concerns or points at this time.  Foley catheter draining appropriately.  Objective: Vitals:   10/08/20 1300 10/08/20 1315 10/08/20 1330 10/08/20 1626  BP: 124/67   134/66  Pulse: 87 87 91 88  Resp: (!) 25 (!) 23 (!) 21 18  Temp:    99.1 F (37.3 C)  TempSrc:    Oral  SpO2: 99% 100% 100% 100%    Intake/Output Summary (Last 24 hours) at 10/08/2020 1651 Last data filed at 10/08/2020 0522 Gross per 24 hour  Intake 350.57 ml  Output 2500 ml  Net  -2149.43 ml   There were no vitals filed for this visit.  Examination: Physical Exam:  Constitutional: WN/WD African-American male currently no acute distress does appear little agitated Eyes: Lids and conjunctivae normal, sclerae anicteric  ENMT: External Ears, Nose appear normal. Grossly normal hearing. Neck: Appears normal, supple, no cervical masses, normal ROM, no appreciable thyromegaly; no JVD Respiratory: Diminished to auscultation bilaterally more so on the right, no wheezing, rales, rhonchi or crackles. Normal respiratory effort and patient is not tachypenic. No accessory muscle use.  Unlabored breathing Cardiovascular: RRR, no murmurs / rubs / gallops. S1 and S2 auscultated.  No appreciable extremity edema Abdomen: Soft, tender to palpate in the lower abdomen, distended slightly. Bowel sounds positive.  GU: Deferred. Musculoskeletal: No clubbing / cyanosis of digits/nails. No joint deformity upper and lower extremities.  Skin: No rashes, lesions,  ulcers on limited skin evaluation. No induration; Warm and dry.  Neurologic: CN 2-12 grossly intact with no focal deficits. Romberg sign cerebellar reflexes not assessed.  Psychiatric: Normal judgment and insight. Alert and oriented x 3.  Slightly agitated mood and appropriate affect.   Data Reviewed: I have personally reviewed following labs and imaging studies  CBC: Recent Labs  Lab 10/07/20 0919 10/08/20 0747  WBC 12.3* 8.0  NEUTROABS 10.4*  --   HGB 9.1* 7.8*  HCT 28.5* 24.1*  MCV 83.3 83.7  PLT 317 355   Basic Metabolic Panel: Recent Labs  Lab 10/07/20 1000 10/08/20 0521 10/08/20 0747  NA 132*  --  138  K 3.7  --  3.4*  CL 103  --  115*  CO2 14*  --  16*  GLUCOSE 175*  --  83  BUN 35*  --  34*  CREATININE 2.81* 2.41* 2.22*  CALCIUM 9.2  --  8.3*   GFR: Estimated Creatinine Clearance: 29.3 mL/min (A) (by C-G formula based on SCr of 2.22 mg/dL (H)). Liver Function Tests: Recent Labs  Lab 10/07/20 1000  10/08/20 0747  AST 19 12*  ALT 16 11  ALKPHOS 82 55  BILITOT 0.5 0.8  PROT 7.2 5.8*  ALBUMIN 2.5* 2.2*   No results for input(s): LIPASE, AMYLASE in the last 168 hours. No results for input(s): AMMONIA in the last 168 hours. Coagulation Profile: No results for input(s): INR, PROTIME in the last 168 hours. Cardiac Enzymes: No results for input(s): CKTOTAL, CKMB, CKMBINDEX, TROPONINI in the last 168 hours. BNP (last 3 results) No results for input(s): PROBNP in the last 8760 hours. HbA1C: No results for input(s): HGBA1C in the last 72 hours. CBG: No results for input(s): GLUCAP in the last 168 hours. Lipid Profile: No results for input(s): CHOL, HDL, LDLCALC, TRIG, CHOLHDL, LDLDIRECT in the last 72 hours. Thyroid Function Tests: Recent Labs    10/07/20 1255  TSH 0.916   Anemia Panel: No results for input(s): VITAMINB12, FOLATE, FERRITIN, TIBC, IRON, RETICCTPCT in the last 72 hours. Sepsis Labs: Recent Labs  Lab 10/07/20 1255 10/07/20 1504  PROCALCITON 1.34  --   LATICACIDVEN 1.9 1.3    Recent Results (from the past 240 hour(s))  Culture, blood (Routine X 2) w Reflex to ID Panel     Status: None (Preliminary result)   Collection Time: 10/07/20 12:55 PM   Specimen: BLOOD  Result Value Ref Range Status   Specimen Description   Final    BLOOD LEFT ANTECUBITAL Performed at [facility], [place] [address] [place], [place] [address]    Special Requests   Final    BOTTLES DRAWN AEROBIC AND ANAEROBIC Blood Culture adequate volume Performed at [facility] [address] [place], [place] [address]    Culture   Final    NO GROWTH < 24 HOURS Performed at [facility] [address] [place], [place] [address]    Report Status PENDING  Incomplete  Resp Panel by RT-PCR (Flu A&B, Covid) Nasopharyngeal Swab     Status: None   Collection Time: 10/07/20  1:02 PM   Specimen: Nasopharyngeal Swab; Nasopharyngeal(NP) swabs in vial  transport medium  Result Value Ref Range Status   SARS Coronavirus 2 by RT PCR NEGATIVE NEGATIVE Final    Comment: (NOTE) SARS-CoV-2 target nucleic acids are NOT DETECTED.  The SARS-CoV-2 RNA is generally detectable in upper respiratory specimens during the acute phase of infection. The lowest concentration of  SARS-CoV-2 viral copies this assay can detect is 138 copies/mL. A negative result does not preclude SARS-Cov-2 infection and should not be used as the sole basis for treatment or other patient management decisions. A negative result may occur with  improper specimen collection/handling, submission of specimen other than nasopharyngeal swab, presence of viral mutation(s) within the areas targeted by this assay, and inadequate number of viral copies(<138 copies/mL). A negative result must be combined with clinical observations, patient history, and epidemiological information. The expected result is Negative.  Fact Sheet for Patients:  BloggerCourse.com  Fact Sheet for Healthcare Providers:  SeriousBroker.it  This test is no t yet approved or cleared by the [place] FDA and  has been authorized for detection and/or diagnosis of SARS-CoV-2 by FDA under an Emergency Use Authorization (EUA). This EUA will remain  in effect (meaning this test can be used) for the duration of the COVID-19 declaration under Section 564(b)(1) of the Act, 21 U.S.C.section 360bbb-3(b)(1), unless the authorization is terminated  or revoked sooner.       Influenza A by PCR NEGATIVE NEGATIVE Final   Influenza B by PCR NEGATIVE NEGATIVE Final    Comment: (NOTE) The Xpert Xpress SARS-CoV-2/FLU/RSV plus assay is intended as an aid in the diagnosis of influenza from Nasopharyngeal swab specimens and should not be used as a sole basis for treatment. Nasal washings and aspirates are unacceptable for Xpert Xpress SARS-CoV-2/FLU/RSV testing.  Fact  Sheet for Patients: BloggerCourse.com  Fact Sheet for Healthcare Providers: SeriousBroker.it  This test is not yet approved or cleared by the [place] FDA and has been authorized for detection and/or diagnosis of SARS-CoV-2 by FDA under an Emergency Use Authorization (EUA). This EUA will remain in effect (meaning this test can be used) for the duration of the COVID-19 declaration under Section 564(b)(1) of the Act, 21 U.S.C. section 360bbb-3(b)(1), unless the authorization is terminated or revoked.  Performed at [facility], [address], [place], [place] [address]   MRSA PCR Screening     Status: None   Collection Time: 10/08/20 11:27 AM   Specimen: Nasopharyngeal  Result Value Ref Range Status   MRSA by PCR NEGATIVE NEGATIVE Final    Comment:        The GeneXpert MRSA Assay (FDA approved for NASAL specimens only), is one component of a comprehensive MRSA colonization surveillance program. It is not intended to diagnose MRSA infection nor to guide or monitor treatment for MRSA infections. Performed at [facility], [address], [place], [place] [address]     RN Pressure Injury Documentation:     Estimated body mass index is 20.35 kg/m as calculated from the following:   Height as of an earlier encounter on 10/07/20: 5\' 9"  (1.753 m).   Weight as of an earlier encounter on 10/07/20: 62.5 kg.  Malnutrition Type:   Malnutrition Characteristics:   Nutrition Interventions:    Radiology Studies: CT ABDOMEN PELVIS WO CONTRAST  Result Date: 10/07/2020 CLINICAL DATA:  Abdominal pain, fever EXAM: CT ABDOMEN AND PELVIS WITHOUT CONTRAST TECHNIQUE: Multidetector CT imaging of the abdomen and pelvis was performed following the standard protocol without IV contrast. COMPARISON:  PET-CT 10/25/2019 FINDINGS: Lower chest: Airways thickening and scattered secretions in the lower lobe  airways. Some additional mixed solid and ground-glass opacities present in the right lung base. Could reflect acute infection or inflammation or sequela of aspiration. Cardiomegaly. Hypoattenuation of the cardiac blood pool compatible with relative anemia. Three-vessel coronary artery atherosclerosis. No  pericardial effusion. Hepatobiliary: No visible focal liver lesion. Smooth surface contour. Normal hepatic attenuation. Normal gallbladder and biliary tree without visible calcified gallstone. Pancreas: No pancreatic ductal dilatation or surrounding inflammatory changes. Spleen: Normal in size. No concerning splenic lesions. Adrenals/Urinary Tract: Adrenal. Previously seen left upper pole hyperdense cyst is no longer visible. No new or concerning focal renal lesions. Severe bilateral hydroureteronephrosis to the level of an irregularly thickened, lobular shaped urinary bladder with marked irregular mural thickening as well as more masslike extension from the bladder dome C2 several adjacent loops small bowel and colon best seen coronal image 3/58. Additional layering density is seen within the bladder lumen. Indentation of bladder base by an enlarged prostate as well. Extensive surrounding inflammatory changes of the kidneys, ureters and urinary bladder. Stomach/Bowel: Distal esophagus, stomach and duodenum are unremarkable. No proximal small bowel thickening or dilatation. Portion of the small bowel closely apposing the irregular lobular extension of the bladder dome with loss of clearly discernible fat plane. No resulting obstruction. Similar site irregular fat plane loss noted in the descending and proximal sigmoid colon as well with some mild mural thickening of the adjacent colonic segment. Normal appendix. No bowel obstruction. Vascular/Lymphatic: Atherosclerotic calcifications within the abdominal aorta and branch vessels. No aneurysm or ectasia. No enlarged abdominopelvic lymph nodes. Reproductive: Enlarged  prostate with median lobe hypertrophy indenting the bladder base. Other: Inflammatory changes centered upon the bladder, kidneys and ureters as above. No extraluminal fluid or gas is evident. No bowel containing hernia. Small fat containing umbilical and bilateral inguinal hernias. Musculoskeletal: The osseous structures appear diffusely demineralized which may limit detection of small or nondisplaced fractures. Transitional lumbosacral vertebrae partially fused with the adjacent sacrum and ilia. Bony fusion across the bilateral SI joints. Mild dextrocurvature of the spine apex at the L1 level. Additional multilevel degenerative changes in the hips and pelvis. No acute osseous abnormality or suspicious osseous lesion. IMPRESSION: 1. Severe bilateral hydroureteronephrosis to the level of an irregularly thickened, lobular urinary bladder with marked irregular mural thickening as well as more masslike extension from the bladder dome which closely apposes several adjacent loops of thickened small bowel and colon with loss of discernible fat plane. Overall appearance is new from comparison PET-CT 10/25/2019. Highly worrisome for a primary bladder malignancy with resulting obstructive uropathy and likely superimposed infection. Fistulization with the adjacent bowel loops is not completely excluded though less favored in the absence of intraluminal gas. Additional layering density is seen within the bladder lumen, could reflect blood products or proteinaceous debris. 2. Enlarged prostate with median lobe hypertrophy indenting the bladder base. 3. Airways thickening and scattered secretions in the lower lobe airways with additional mixed solid and ground-glass opacities in the right lung base, could reflect acute infection or inflammation or sequela of aspiration. 4. Aortic Atherosclerosis (ICD10-I70.0). These results were called by telephone at the time of interpretation on 10/07/2020 at 6:16 pm to provider [facility] ,  who verbally acknowledged these results. Electronically Signed   By: Lovena Le M.D.   On: 10/07/2020 18:14   DG Chest Port 1 View  Result Date: 10/07/2020 CLINICAL DATA:  Cough EXAM: PORTABLE CHEST 1 VIEW COMPARISON:  05/24/2020 FINDINGS: Mild cardiomegaly status post median sternotomy. Both lungs are clear. The visualized skeletal structures are unremarkable. IMPRESSION: Mild cardiomegaly without acute abnormality of the lungs in AP portable projection. Electronically Signed   By: Eddie Candle M.D.   On: 10/07/2020 13:16   Scheduled Meds: . heparin  5,000 Units Subcutaneous Q8H  .  multivitamin with minerals  1 tablet Oral Daily  . sodium chloride flush  3 mL Intravenous Q12H  . tamsulosin  0.4 mg Oral Daily   Continuous Infusions: . ceFEPime (MAXIPIME) IV Stopped (10/08/20 0953)  . metronidazole Stopped (10/08/20 1032)    LOS: 1 day   Kerney Elbe, DO [facility] PAGER is on AMION  If 7PM-7AM, please contact night-coverage [email or web address]

## 2020-10-08 NOTE — Consult Note (Signed)
H&P Physician requesting consult: [name]  Chief Complaint: Bilateral hydronephrosis, questionable bladder mass  History of Present Illness: 65 year old male with history of multiple myeloma currently in remission as well as a history of CHF, history of pulmonary embolus on apixaban presented with poor p.o. intake and a 15 pound weight loss/failure to thrive.  He has had a lack of appetite and has not been eating.  He was noted to be febrile, tachycardic, tachypneic.  He subsequently underwent CT of the abdomen and pelvis without contrast that revealed severe bilateral hydroureteronephrosis down to the level of a markedly irregular bladder with masslike extension of the bladder dome that appears to be a large mass overlying the bladder dome and distended bladder with enlarged prostate with median lobe hypertrophy.  This was concerning for possible primary bladder malignancy.  He had a Foley catheter placed draining clear yellow urine.  Creatinine is improving as is his clinical condition.  He has had excellent urine output.  Past Medical History:  Diagnosis Date  . Anemia   . Arthritis    "left knee" (11/24/2017)  . CAD (coronary artery disease)    CABG 2002  . Cancer [facility])    Multiple Myeloma  . CHF (congestive heart failure) ([place])   . Chronic kidney disease   . Chronic renal insufficiency   . Diabetes mellitus without complication ([place])   . Dilated cardiomyopathy ([place])    echo in 2009 showed improvement with a normal EF  . HTN (hypertension)   . Hyperlipemia   . Hypertension   . Noncompliance   . PAD (peripheral artery disease) ([place])   . Pneumonia    Past Surgical History:  Procedure Laterality Date  . CARDIAC CATHETERIZATION  2002, 2006  . CORONARY ARTERY BYPASS GRAFT  2002   x4 DR. VAN TRIGT  . IR FLUORO GUIDED NEEDLE PLC ASPIRATION/INJECTION LOC  10/24/2019    Home Medications:  Medications Prior to Admission  Medication Sig Dispense Refill Last Dose  . amLODipine  (NORVASC) 5 MG tablet Take 5 mg by mouth daily.   Past Week at Unknown time  . aspirin EC 81 MG tablet Take 81 mg by mouth daily. Swallow whole.   Past Week at Unknown time  . atorvastatin (LIPITOR) 80 MG tablet Take 1 tablet by mouth once daily (Patient taking differently: Take 80 mg by mouth daily.) 90 tablet 3 Past Week at Unknown time  . carvedilol (COREG) 25 MG tablet Take 25 mg by mouth 2 (two) times daily with a meal.   Past Week at Unknown time  . dexamethasone (DECADRON) 4 MG tablet Take 12 mg by mouth See admin instructions. Take as directed after chemo treatments   Past Week at Unknown time  . hydrALAZINE (APRESOLINE) 50 MG tablet TAKE 1 TABLET BY MOUTH THREE TIMES DAILY (Patient taking differently: Take 50 mg by mouth 3 (three) times daily.) 270 tablet 3 Past Week at Unknown time  . isosorbide mononitrate (IMDUR) 30 MG 24 hr tablet Take 1 tablet (30 mg total) by mouth daily. 90 tablet 2 Past Week at Unknown time  . lactose free nutrition (BOOST) LIQD Take 237 mLs by mouth as needed (for supplementation).   Past Month at Unknown time  . prochlorperazine (COMPAZINE) 10 MG tablet Take 1 tablet (10 mg total) by mouth every 6 (six) hours as needed (Nausea or vomiting). (Patient taking differently: Take 10 mg by mouth every 6 (six) hours as needed for nausea or vomiting.) 30 tablet 1 unk  .  repaglinide (PRANDIN) 1 MG tablet TAKE 1 TABLET BY MOUTH THREE TIMES DAILY BEFORE MEAL(S) (Patient taking differently: Take 1 mg by mouth 3 (three) times daily before meals.) 90 tablet 2 Past Week at Unknown time  . Accu-Chek Softclix Lancets lancets Use Accu Chek softclix lancets to check blood sugar fasting or 2 hours after a meal every other day. 100 each 2   . acyclovir (ZOVIRAX) 400 MG tablet Take 0.5 tablets (200 mg total) by mouth 2 (two) times daily. 60 tablet 11 unk  . apixaban (ELIQUIS) 2.5 MG TABS tablet Take 1 tablet (2.5 mg total) by mouth 2 (two) times daily. (Patient not taking: No sig reported)  60 tablet 2 Not Taking at Unknown time  . Blood Glucose Monitoring Suppl (ACCU-CHEK GUIDE ME) w/Device KIT 1 each by Does not apply route.     [name] glucose blood (ACCU-CHEK GUIDE) test strip Use Accu Chek test strips as instructed to check blood sugar fasting or two hours after meals, every other day. 100 each 2   . losartan (COZAAR) 100 MG tablet Take 1 tablet by mouth once daily (Patient not taking: No sig reported) 90 tablet 3 Not Taking at Unknown time  . potassium chloride SA (KLOR-CON) 20 MEQ tablet Take 1 tablet by mouth once daily (Patient not taking: No sig reported) 30 tablet 3 Not Taking at Unknown time  . senna-docusate (SENNA S) 8.6-50 MG tablet Take 2 tablets by mouth at bedtime. (Patient not taking: Reported on 10/07/2020) 60 tablet 5 Not Taking at Unknown time   Allergies:  Allergies  Allergen Reactions  . Metformin And Related Diarrhea    Family History  Problem Relation Age of Onset  . Hypertension Father   . Diabetes Mother        DIABETIC COMA  . Healthy Daughter    Social History:  reports that he has never smoked. He has never used smokeless tobacco. He reports previous alcohol use. He reports previous drug use.  ROS: A complete review of systems was performed.  All systems are negative except for pertinent findings as noted. ROS   Physical Exam:  Vital signs in last 24 hours: Temp:  [97.5 F (36.4 C)-99.1 F (37.3 C)] 99.1 F (37.3 C) (03/29 1626) Pulse Rate:  [74-98] 88 (03/29 1626) Resp:  [17-37] 18 (03/29 1626) BP: (107-134)/(54-71) 134/66 (03/29 1626) SpO2:  [98 %-100 %] 100 % (03/29 1626) General:  Alert and oriented, No acute distress HEENT: Normocephalic, atraumatic Neck: No JVD or lymphadenopathy Cardiovascular: Regular rate and rhythm Lungs: Regular rate and effort Abdomen: Abdomen was soft but he tensed up with palpation making it difficult to determine if there was mass.  Back: No CVA tenderness Genitourinary: Uncircumcised phallus.  Foreskin  was retracted with paraphimosis and I reduce this.  Foley catheter draining clear yellow urine Extremities: No edema Neurologic: Grossly intact  Laboratory Data:  Results for orders placed or performed during the hospital encounter of 10/07/20 (from the past 24 hour(s))  Creatinine, serum     Status: Abnormal   Collection Time: 10/08/20  5:21 AM  Result Value Ref Range   Creatinine, Ser 2.41 (H) 0.61 - 1.24 mg/dL   GFR, Estimated 29 (L) >60 mL/min  CBC     Status: Abnormal   Collection Time: 10/08/20  7:47 AM  Result Value Ref Range   WBC 8.0 4.0 - 10.5 K/uL   RBC 2.88 (L) 4.22 - 5.81 MIL/uL   Hemoglobin 7.8 (L) 13.0 - 17.0 g/dL  HCT 24.1 (L) 39.0 - 52.0 %   MCV 83.7 80.0 - 100.0 fL   MCH 27.1 26.0 - 34.0 pg   MCHC 32.4 30.0 - 36.0 g/dL   RDW 15.6 (H) 11.5 - 15.5 %   Platelets 284 150 - 400 K/uL   nRBC 0.0 0.0 - 0.2 %  Comprehensive metabolic panel     Status: Abnormal   Collection Time: 10/08/20  7:47 AM  Result Value Ref Range   Sodium 138 135 - 145 mmol/L   Potassium 3.4 (L) 3.5 - 5.1 mmol/L   Chloride 115 (H) 98 - 111 mmol/L   CO2 16 (L) 22 - 32 mmol/L   Glucose, Bld 83 70 - 99 mg/dL   BUN 34 (H) 8 - 23 mg/dL   Creatinine, Ser 2.22 (H) 0.61 - 1.24 mg/dL   Calcium 8.3 (L) 8.9 - 10.3 mg/dL   Total Protein 5.8 (L) 6.5 - 8.1 g/dL   Albumin 2.2 (L) 3.5 - 5.0 g/dL   AST 12 (L) 15 - 41 U/L   ALT 11 0 - 44 U/L   Alkaline Phosphatase 55 38 - 126 U/L   Total Bilirubin 0.8 0.3 - 1.2 mg/dL   GFR, Estimated 32 (L) >60 mL/min   Anion gap 7 5 - 15  MRSA PCR Screening     Status: None   Collection Time: 10/08/20 11:27 AM   Specimen: Nasopharyngeal  Result Value Ref Range   MRSA by PCR NEGATIVE NEGATIVE  Glucose, capillary     Status: None   Collection Time: 10/08/20  5:40 PM  Result Value Ref Range   Glucose-Capillary 87 70 - 99 mg/dL   Recent Results (from the past 240 hour(s))  Culture, blood (Routine X 2) w Reflex to ID Panel     Status: None (Preliminary result)    Collection Time: 10/07/20 12:55 PM   Specimen: BLOOD  Result Value Ref Range Status   Specimen Description   Final    BLOOD LEFT ANTECUBITAL Performed at [facility], [address], [place], [place] [address]    Special Requests   Final    BOTTLES DRAWN AEROBIC AND ANAEROBIC Blood Culture adequate volume Performed at [facility] [address], [place], [place] [address]    Culture   Final    NO GROWTH < 24 HOURS Performed at [facility] [address] [place], [place] [address]    Report Status PENDING  Incomplete  Resp Panel by RT-PCR (Flu A&B, Covid) Nasopharyngeal Swab     Status: None   Collection Time: 10/07/20  1:02 PM   Specimen: Nasopharyngeal Swab; Nasopharyngeal(NP) swabs in vial transport medium  Result Value Ref Range Status   SARS Coronavirus 2 by RT PCR NEGATIVE NEGATIVE Final    Comment: (NOTE) SARS-CoV-2 target nucleic acids are NOT DETECTED.  The SARS-CoV-2 RNA is generally detectable in upper respiratory specimens during the acute phase of infection. The lowest concentration of SARS-CoV-2 viral copies this assay can detect is 138 copies/mL. A negative result does not preclude SARS-Cov-2 infection and should not be used as the sole basis for treatment or other patient management decisions. A negative result may occur with  improper specimen collection/handling, submission of specimen other than nasopharyngeal swab, presence of viral mutation(s) within the areas targeted by this assay, and inadequate number of viral copies(<138 copies/mL). A negative result must be combined with clinical observations, patient history, and epidemiological information. The expected result is Negative.  Fact  Sheet for Patients:  EntrepreneurPulse.com.au  Fact Sheet for Healthcare Providers:  IncredibleEmployment.be  This test is no t yet approved or cleared by the [place] FDA and  has  been authorized for detection and/or diagnosis of SARS-CoV-2 by FDA under an Emergency Use Authorization (EUA). This EUA will remain  in effect (meaning this test can be used) for the duration of the COVID-19 declaration under Section 564(b)(1) of the Act, 21 U.S.C.section 360bbb-3(b)(1), unless the authorization is terminated  or revoked sooner.       Influenza A by PCR NEGATIVE NEGATIVE Final   Influenza B by PCR NEGATIVE NEGATIVE Final    Comment: (NOTE) The Xpert Xpress SARS-CoV-2/FLU/RSV plus assay is intended as an aid in the diagnosis of influenza from Nasopharyngeal swab specimens and should not be used as a sole basis for treatment. Nasal washings and aspirates are unacceptable for Xpert Xpress SARS-CoV-2/FLU/RSV testing.  Fact Sheet for Patients: EntrepreneurPulse.com.au  Fact Sheet for Healthcare Providers: IncredibleEmployment.be  This test is not yet approved or cleared by the [place] FDA and has been authorized for detection and/or diagnosis of SARS-CoV-2 by FDA under an Emergency Use Authorization (EUA). This EUA will remain in effect (meaning this test can be used) for the duration of the COVID-19 declaration under Section 564(b)(1) of the Act, 21 U.S.C. section 360bbb-3(b)(1), unless the authorization is terminated or revoked.  Performed at [facility], [place] [address], [place], [place] [address]   MRSA PCR Screening     Status: None   Collection Time: 10/08/20 11:27 AM   Specimen: Nasopharyngeal  Result Value Ref Range Status   MRSA by PCR NEGATIVE NEGATIVE Final    Comment:        The GeneXpert MRSA Assay (FDA approved for NASAL specimens only), is one component of a comprehensive MRSA colonization surveillance program. It is not intended to diagnose MRSA infection nor to guide or monitor treatment for MRSA infections. 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  Follow renal function.  Agree with urine and blood cultures and continuing antibiotics and treating sepsis.  He will need a cystoscopy outpatient after he has recovered from sepsis.  Marton Redwood, III 10/08/2020, 7:05 PM

## 2020-10-09 ENCOUNTER — Inpatient Hospital Stay (HOSPITAL_COMMUNITY): Payer: Medicare Other

## 2020-10-09 DIAGNOSIS — L8990 Pressure ulcer of unspecified site, unspecified stage: Secondary | ICD-10-CM | POA: Insufficient documentation

## 2020-10-09 DIAGNOSIS — N3289 Other specified disorders of bladder: Secondary | ICD-10-CM

## 2020-10-09 LAB — COMPREHENSIVE METABOLIC PANEL
ALT: 8 U/L (ref 0–44)
AST: 8 U/L — ABNORMAL LOW (ref 15–41)
Albumin: 2.3 g/dL — ABNORMAL LOW (ref 3.5–5.0)
Alkaline Phosphatase: 49 U/L (ref 38–126)
Anion gap: 13 (ref 5–15)
BUN: 30 mg/dL — ABNORMAL HIGH (ref 8–23)
CO2: 14 mmol/L — ABNORMAL LOW (ref 22–32)
Calcium: 8.7 mg/dL — ABNORMAL LOW (ref 8.9–10.3)
Chloride: 111 mmol/L (ref 98–111)
Creatinine, Ser: 1.87 mg/dL — ABNORMAL HIGH (ref 0.61–1.24)
GFR, Estimated: 39 mL/min — ABNORMAL LOW (ref >60)
Glucose, Bld: 81 mg/dL (ref 70–99)
Potassium: 3.4 mmol/L — ABNORMAL LOW (ref 3.5–5.1)
Sodium: 138 mmol/L (ref 135–145)
Total Bilirubin: 0.9 mg/dL (ref 0.3–1.2)
Total Protein: 5.5 g/dL — ABNORMAL LOW (ref 6.5–8.1)

## 2020-10-09 LAB — CBC WITH DIFFERENTIAL/PLATELET
Abs Immature Granulocytes: 0.09 10*3/uL — ABNORMAL HIGH (ref 0.00–0.07)
Basophils Absolute: 0 10*3/uL (ref 0.0–0.1)
Basophils Relative: 0 %
Eosinophils Absolute: 0 10*3/uL (ref 0.0–0.5)
Eosinophils Relative: 0 %
HCT: 24.9 % — ABNORMAL LOW (ref 39.0–52.0)
Hemoglobin: 7.6 g/dL — ABNORMAL LOW (ref 13.0–17.0)
Immature Granulocytes: 1 %
Lymphocytes Relative: 10 %
Lymphs Abs: 0.7 10*3/uL (ref 0.7–4.0)
MCH: 26.8 pg (ref 26.0–34.0)
MCHC: 30.5 g/dL (ref 30.0–36.0)
MCV: 87.7 fL (ref 80.0–100.0)
Monocytes Absolute: 0.5 10*3/uL (ref 0.1–1.0)
Monocytes Relative: 7 %
Neutro Abs: 5.8 10*3/uL (ref 1.7–7.7)
Neutrophils Relative %: 82 %
Platelets: 303 10*3/uL (ref 150–400)
RBC: 2.84 MIL/uL — ABNORMAL LOW (ref 4.22–5.81)
RDW: 15.6 % — ABNORMAL HIGH (ref 11.5–15.5)
WBC: 7.1 10*3/uL (ref 4.0–10.5)
nRBC: 0 % (ref 0.0–0.2)

## 2020-10-09 LAB — TYPE AND SCREEN
ABO/RH(D): O POS
Antibody Screen: POSITIVE

## 2020-10-09 LAB — GLUCOSE, CAPILLARY
Glucose-Capillary: 74 mg/dL (ref 70–99)
Glucose-Capillary: 105 mg/dL — ABNORMAL HIGH (ref 70–99)
Glucose-Capillary: 92 mg/dL (ref 70–99)
Glucose-Capillary: 135 mg/dL — ABNORMAL HIGH (ref 70–99)

## 2020-10-09 LAB — PHOSPHORUS: Phosphorus: 2.7 mg/dL (ref 2.5–4.6)

## 2020-10-09 LAB — MAGNESIUM: Magnesium: 1.5 mg/dL — ABNORMAL LOW (ref 1.7–2.4)

## 2020-10-09 MED ORDER — ENSURE ENLIVE PO LIQD
237.0000 mL | Freq: Two times a day (BID) | ORAL | Status: DC
  Administered 2020-10-09 – 2020-10-11 (×4): 237 mL via ORAL

## 2020-10-09 MED ORDER — MAGNESIUM SULFATE 2 GM/50ML IV SOLN
2.0000 g | Freq: Once | INTRAVENOUS | Status: AC
  Administered 2020-10-09: 2 g via INTRAVENOUS
  Filled 2020-10-09: qty 50

## 2020-10-09 MED ORDER — SODIUM CHLORIDE 0.9 % IV SOLN
2.0000 g | Freq: Two times a day (BID) | INTRAVENOUS | Status: DC
  Administered 2020-10-09 – 2020-10-10 (×3): 2 g via INTRAVENOUS
  Filled 2020-10-09 (×3): qty 2

## 2020-10-09 MED ORDER — POTASSIUM CHLORIDE CRYS ER 20 MEQ PO TBCR
40.0000 meq | EXTENDED_RELEASE_TABLET | Freq: Once | ORAL | Status: AC
  Administered 2020-10-09: 40 meq via ORAL
  Filled 2020-10-09: qty 2

## 2020-10-09 MED ORDER — PROSOURCE PLUS PO LIQD
30.0000 mL | Freq: Two times a day (BID) | ORAL | Status: DC
  Administered 2020-10-09 – 2020-10-10 (×3): 30 mL via ORAL
  Filled 2020-10-09 (×4): qty 30

## 2020-10-09 NOTE — Progress Notes (Signed)
Initial Nutrition Assessment  DOCUMENTATION CODES:   Not applicable  INTERVENTION:  - will order Ensure Enlive BID, each supplement provides 350 kcal and 20 grams of protein. - will order 30 ml Prosource Plus BID, each supplement provides 100 kcal and 15 grams protein.  - complete NFPE when feasible.  NUTRITION DIAGNOSIS:   Inadequate oral intake related to poor appetite as evidenced by per patient/family report,meal completion < 50%.  GOAL:   Patient will meet greater than or equal to 90% of their needs  MONITOR:   PO intake,Supplement acceptance,Labs,Weight trends  REASON FOR ASSESSMENT:   Malnutrition Screening Tool,Consult Assessment of nutrition requirement/status  ASSESSMENT:   65 year old male with medical history of multiple myeloma (in remission), HTN, HLD, DM, RA, stage 3 CKD, and CHF. He presented to the ED with poor p.o. intake for several weeks and reported 15 pound weight loss in 13 days. He was seen by Oncologist today and reported he will eat one day and then not eat for another 2 days or so. Oncologist recommended direct admission for FTT. He reports some constipation.  No intakes documented since admission. Patient sitting in the chair with untouched lunch tray in front of him and no family or visitors present.   He states he does not want to eat right now and is very interested in moving back to the bed. He is frustrated with how many people have been in and out of his room asking him questions. Noted that he refused to work with SLP this AM.   He was assessed by [facility] RD on 07/03/20 at which time he was experiencing taste alteration (bland taste) with all food. He does report that this has improved.  Unable to obtain any other information from patient at this time.   From H&P, patient had reported to ED staff that he has lost 15 lb in the past 2 weeks. Weight today is 146 lb and weight on 09/23/20 was 151 lb which indicates 5 lb weight  loss (3% body weight) in the past 2 weeks.     Labs reviewed; CBGs: 74 and 105 mg/dl, K: 3.4 mmol/l, BUN: 30 mg/dl, creatinine: 1.87 mg/dl, Ca: 8.7 mg/dl, Mg: 1.5 mg/dl, GFR: 39 ml/min.  Medications reviewed; sliding scale novolog, 1 tablet multivitamin with minerals/day, 40 mEq Klor-Con x1 dose 3/30.    NUTRITION - FOCUSED PHYSICAL EXAM:  patient deferred  Diet Order:   Diet Order            Diet heart healthy/carb modified Room service appropriate? Yes; Fluid consistency: Thin  Diet effective now                 EDUCATION NEEDS:   Not appropriate for education at this time  Skin:  Skin Assessment: Skin Integrity Issues: Skin Integrity Issues:: Stage I Stage I: sacrum  Last BM:  PTA/unknown  Height:   Ht Readings from Last 1 Encounters:  10/08/20 $RemoveB'5\' 9"'SzyQeesM$  (1.753 m)    Weight:   Wt Readings from Last 1 Encounters:  10/09/20 66.4 kg    Estimated Nutritional Needs:  Kcal:  1800-2000 kcal Protein:  75-90 grams Fluid:  >/= 1.8 L/day      Jarome Matin, MS, RD, LDN, CNSC Inpatient Clinical Dietitian RD pager # available in [facility]  After hours/weekend pager # available in [facility]

## 2020-10-09 NOTE — Progress Notes (Signed)
Pharmacy Antibiotic Note  [name] is a 65 y.o. male admitted on 10/07/2020 with sepsis. Pharmacy has been consulted for cefepime dosing.  Day #3 abx (cefepime, flagyl) - Afebrile - WBC improved 7.1 - SCr improved 1.87, CrCl 37 ml/min - Cultures neg to date  Plan:  Adjust Cefepime 2 g IV q12 hr for improved renal function  Consider stopping Flagyl if abdominal source ruled out   Height: 5\' 9"  (175.3 cm) (per patient report) Weight: 66.4 kg (146 lb 6.2 oz) IBW/kg (Calculated) : 70.7  Temp (24hrs), Avg:98.7 F (37.1 C), Min:98.3 F (36.8 C), Max:99.1 F (37.3 C)  Recent Labs  Lab 10/07/20 0919 10/07/20 1000 10/07/20 1255 10/07/20 1504 10/08/20 0521 10/08/20 0747 10/09/20 0453  WBC 12.3*  --   --   --   --  8.0 7.1  CREATININE  --  2.81*  --   --  2.41* 2.22* 1.87*  LATICACIDVEN  --   --  1.9 1.3  --   --   --     Estimated Creatinine Clearance: 37 mL/min (A) (by C-G formula based on SCr of 1.87 mg/dL (H)).    Allergies  Allergen Reactions  . Metformin And Related Diarrhea    Antimicrobials this admission: 3/28 cefepime >>  3/28 vancomycin >>  3/29  3/28 flagyl >>  Dose adjustments this admission: n/a  Microbiology results: 3/28 BCx x 1: ngtd 3/28 UCx: reincubate  Thank you for allowing pharmacy to be a part of this patient's care.  Peggyann Juba, PharmD, BCPS Pharmacy: [phone] 10/09/2020 9:27 AM

## 2020-10-09 NOTE — TOC Progression Note (Signed)
Transition of Care [employer] [facility]) - Progression Note    Patient Details  Name: [name] MRN: [id number] Date of Birth: [birth year]  [facility] CM/SW Contact  Purcell Mouton, RN Phone Number: 10/09/2020, 12:49 PM  Clinical Narrative:    Pt from home with spouse. Plan to discharge home. TOC will continue to follow.    Expected Discharge Plan: [place] Barriers to Discharge: No Barriers Identified  Expected Discharge Plan and Services Expected Discharge Plan: [place] arrangements for the past 2 months: Single Family Home                                       Social Determinants of Health (SDOH) Interventions    Readmission Risk Interventions No flowsheet data found.

## 2020-10-09 NOTE — Progress Notes (Signed)
PROGRESS NOTE  [name]  LKJ:[id number] DOB: [birth year] DOA: 10/07/2020 PCP: Isaac Bliss, Rayford Halsted, MD   Brief Narrative: [name] is a 65 y.o. male with a history of multiple myeloma in remission, RA, T2DM, HTN, HLD, stage IIIb CKD, chronic combined HFrEF, PE on eliquis who presented to the ED on the advice of oncology who noted 15 lbs weight loss associated with anorexia. In the ED he was febrile with leukocytosis (WBC 12.3k), and CT abd/pelvis revealing severe bilateral hydroureteronephrosis and an irregularly thickened, lobular urinary bladder with marked irregular mural thickening as well as more masslike extension from the bladder dome which closely apposes several adjacent loops of thickened small bowel and colon with loss of discernible fat plane.    Assessment & Plan: Principal Problem:   Failure to thrive in adult Active Problems:   Uncontrolled type 2 diabetes mellitus with hyperglycemia (HCC)   Multiple myeloma (HCC)   Sepsis ([place])   Pulmonary embolism ([place])   Hypertension   Pressure injury of skin   Bladder mass  Severe sepsis: Due to UTI. Doubt pneumonia in the absence of cough or infiltrate.  - Monitor blood and urine cultures - DC flagyl, continue cefepime  Bladder mass with obstructive uropathy, bilateral severe hydroureteronephrosis, BPH:  - Urology consulted - Urine output is good with foley catheter.  - Palliative care consulted with this in light of recent FTT.   Weight loss, debility:  - PT, dietitian consulted.   Stage IIIb CKD with metabolic acidosis: - Severity of hydronephrosis with minimal impact to renal function suggests longer chronicity.   History of PE:  - Continue anticoagulation, back to renal dose eliquis.   Multifactorial anemia: Related to multiple myeloma and AOCD, also possible blood loss anemia from bladder mass with RBC on UA micro.  - Monitor CBC in AM with suggested transfusion threshold of 7.5g/dl per  oncology with least incompatible blood product.   Hypokalemia, hypomagnesemia:  - Supplement and monitor  HTN:  - Hold antihypertensives for now  T2DM:  - SSI  Chronic HFpEF:  - Monitor volume status  Multiple myeloma in remission:  - Continue daratumumab q2weeks per Dr. Irene Limbo.   RN Pressure Injury Documentation: Pressure Injury 10/08/20 Sacrum Mid;Lower Stage 1 -  Intact skin with non-blanchable redness of a localized area usually over a bony prominence. (Active)  10/08/20 1718  Location: Sacrum  Location Orientation: Mid;Lower  Staging: Stage 1 -  Intact skin with non-blanchable redness of a localized area usually over a bony prominence.  Wound Description (Comments):   Present on Admission: Yes   DVT prophylaxis: Eliquis Code Status: Full Family Communication: Wife at bedside Disposition Plan:  Status is: Inpatient  Remains inpatient appropriate because:Inpatient level of care appropriate due to severity of illness   Dispo: The patient is from: Home              Anticipated d/c is to: Home              Patient currently is not medically stable to d/c.   Difficult to place patient No       Consultants:   Urology  Oncology  Procedures:   Foley catheter  Antimicrobials:  Vancomycin, cefepime, flagyl >>> cefepime monotherapy   Subjective: Feels weak but hasn't gotten out of bed. Eating ok. Fever resolved. Difficult historian. Absolutely denies cough, wheeze, chest pain, sick contacts.   Objective: Vitals:   10/08/20 2300 10/09/20 0012 10/09/20 0427 10/09/20 1324  BP:  104/63 [name])  115/59 93/74  Pulse:  88 83 90  Resp:  _0 Temp:  98.3 F (36.8 C) 98.5 F (36.9 C) 98 F (36.7 C)  TempSrc:  Oral  Oral  SpO2:  99% 99% 98%  Weight:   66.4 kg   Height: 5' 9" (1.753 m)       Intake/Output Summary (Last 24 hours) at 10/09/2020 1903 Last data filed at 10/09/2020 1800 Gross per 24 hour  Intake 400 ml  Output 3050 ml  Net -2650 ml   Filed  Weights   10/09/20 0427  Weight: 66.4 kg    Gen: 64 y.o. male in no distress Pulm: Non-labored breathing. Clear to auscultation bilaterally.  CV: Regular rate and rhythm. No murmur, rub, or gallop. No JVD, no significant pedal edema. GI: Abdomen soft, tender with fullness in suprapubic region, non-distended, with normoactive bowel sounds. No organomegaly or masses felt. Ext: Warm, no deformities Skin: No rashes, lesions or ulcers Neuro: Alert and oriented. No focal neurological deficits. Psych: Judgement and insight appear normal. Mood & affect appropriate.   Data Reviewed: I have personally reviewed following labs and imaging studies  CBC: Recent Labs  Lab 10/07/20 0919 10/08/20 0747 10/09/20 0453  WBC 12.3* 8.0 7.1  NEUTROABS 10.4*  --  5.8  HGB 9.1* 7.8* 7.6*  HCT 28.5* 24.1* 24.9*  MCV 83.3 83.7 87.7  PLT 317 284 789   Basic Metabolic Panel: Recent Labs  Lab 10/07/20 1000 10/08/20 0521 10/08/20 0747 10/09/20 0453  NA 132*  --  138 138  K 3.7  --  3.4* 3.4*  CL 103  --  115* 111  CO2 14*  --  16* 14*  GLUCOSE 175*  --  83 81  BUN 35*  --  34* 30*  CREATININE 2.81* 2.41* 2.22* 1.87*  CALCIUM 9.2  --  8.3* 8.7*  MG  --   --   --  1.5*  PHOS  --   --   --  2.7   GFR: Estimated Creatinine Clearance: 37 mL/min (A) (by C-G formula based on SCr of 1.87 mg/dL (H)). Liver Function Tests: Recent Labs  Lab 10/07/20 1000 10/08/20 0747 10/09/20 0453  AST 19 12* 8*  ALT _1 ALKPHOS 82 55 49  BILITOT 0.5 0.8 0.9  PROT 7.2 5.8* 5.5*  ALBUMIN 2.5* 2.2* 2.3*   No results for input(s): LIPASE, AMYLASE in the last 168 hours. No results for input(s): AMMONIA in the last 168 hours. Coagulation Profile: No results for input(s): INR, PROTIME in the last 168 hours. Cardiac Enzymes: No results for input(s): CKTOTAL, CKMB, CKMBINDEX, TROPONINI in the last 168 hours. BNP (last 3 results) No results for input(s): PROBNP in the last 8760 hours. HbA1C: No results for  input(s): HGBA1C in the last 72 hours. CBG: Recent Labs  Lab 10/08/20 1740 10/08/20 2113 10/09/20 0749 10/09/20 1136 10/09/20 1755  GLUCAP 87 86 74 105* 92   Lipid Profile: No results for input(s): CHOL, HDL, LDLCALC, TRIG, CHOLHDL, LDLDIRECT in the last 72 hours. Thyroid Function Tests: Recent Labs    10/07/20 1255  TSH 0.916   Anemia Panel: No results for input(s): VITAMINB12, FOLATE, FERRITIN, TIBC, IRON, RETICCTPCT in the last 72 hours. Urine analysis:    Component Value Date/Time   COLORURINE AMBER (A) 10/07/2020 1843   APPEARANCEUR CLOUDY (A) 10/07/2020 1843   LABSPEC 1.009 10/07/2020 1843   PHURINE 7.0 10/07/2020 1843   GLUCOSEU NEGATIVE 10/07/2020 1843   HGBUR MODERATE (  A) 10/07/2020 1843   BILIRUBINUR NEGATIVE 10/07/2020 [place] 10/07/2020 1843   PROTEINUR 100 (A) 10/07/2020 1843   NITRITE NEGATIVE 10/07/2020 1843   LEUKOCYTESUR LARGE (A) 10/07/2020 1843   Recent Results (from the past 240 hour(s))  Culture, blood (Routine X 2) w Reflex to ID Panel     Status: None (Preliminary result)   Collection Time: 10/07/20 12:55 PM   Specimen: BLOOD  Result Value Ref Range Status   Specimen Description   Final    BLOOD LEFT ANTECUBITAL Performed at [facility], [place] [address], [place], [place] [address]    Special Requests   Final    BOTTLES DRAWN AEROBIC AND ANAEROBIC Blood Culture adequate volume Performed at [place] [address], [place], [place] [address]    Culture   Final    NO GROWTH 2 DAYS Performed at [place] [address], [place], [place] [address]    Report Status PENDING  Incomplete  Resp Panel by RT-PCR (Flu A&B, Covid) Nasopharyngeal Swab     Status: None   Collection Time: 10/07/20  1:02 PM   Specimen: Nasopharyngeal Swab; Nasopharyngeal(NP) swabs in vial transport medium  Result Value Ref Range Status   SARS Coronavirus 2 by RT PCR NEGATIVE NEGATIVE Final     Comment: (NOTE) SARS-CoV-2 target nucleic acids are NOT DETECTED.  The SARS-CoV-2 RNA is generally detectable in upper respiratory specimens during the acute phase of infection. The lowest concentration of SARS-CoV-2 viral copies this assay can detect is 138 copies/mL. A negative result does not preclude SARS-Cov-2 infection and should not be used as the sole basis for treatment or other patient management decisions. A negative result may occur with  improper specimen collection/handling, submission of specimen other than nasopharyngeal swab, presence of viral mutation(s) within the areas targeted by this assay, and inadequate number of viral copies(<138 copies/mL). A negative result must be combined with clinical observations, patient history, and epidemiological information. The expected result is Negative.  Fact Sheet for Patients:  EntrepreneurPulse.com.au  Fact Sheet for Healthcare Providers:  IncredibleEmployment.be  This test is no t yet approved or cleared by the [place] FDA and  has been authorized for detection and/or diagnosis of SARS-CoV-2 by FDA under an Emergency Use Authorization (EUA). This EUA will remain  in effect (meaning this test can be used) for the duration of the COVID-19 declaration under Section 564(b)(1) of the Act, 21 U.S.C.section 360bbb-3(b)(1), unless the authorization is terminated  or revoked sooner.       Influenza A by PCR NEGATIVE NEGATIVE Final   Influenza B by PCR NEGATIVE NEGATIVE Final    Comment: (NOTE) The Xpert Xpress SARS-CoV-2/FLU/RSV plus assay is intended as an aid in the diagnosis of influenza from Nasopharyngeal swab specimens and should not be used as a sole basis for treatment. Nasal washings and aspirates are unacceptable for Xpert Xpress SARS-CoV-2/FLU/RSV testing.  Fact Sheet for Patients: EntrepreneurPulse.com.au  Fact Sheet for Healthcare  Providers: IncredibleEmployment.be  This test is not yet approved or cleared by the [place] FDA and has been authorized for detection and/or diagnosis of SARS-CoV-2 by FDA under an Emergency Use Authorization (EUA). This EUA will remain in effect (meaning this test can be used) for the duration of the COVID-19 declaration under Section 564(b)(1) of the Act, 21 U.S.C. section 360bbb-3(b)(1), unless the authorization is terminated or revoked.  Performed at [facility], [place] [address], [place], [place] [address]  Urine Culture     Status: None (Preliminary result)   Collection Time: 10/07/20  6:43 PM   Specimen: Urine, Clean Catch  Result Value Ref Range Status   Specimen Description   Final    URINE, CLEAN CATCH Performed at [facility], [place] [address], [place], [place] [address]    Special Requests   Final    NONE Performed at [facility], [place] [address] [place], [place] [address]    Culture   Final    CULTURE REINCUBATED FOR BETTER GROWTH Performed at [facility], [place] [address], [place], [place] [address]    Report Status PENDING  Incomplete  MRSA PCR Screening     Status: None   Collection Time: 10/08/20 11:27 AM   Specimen: Nasopharyngeal  Result Value Ref Range Status   MRSA by PCR NEGATIVE NEGATIVE Final    Comment:        The GeneXpert MRSA Assay (FDA approved for NASAL specimens only), is one component of a comprehensive MRSA colonization surveillance program. It is not intended to diagnose MRSA infection nor to guide or monitor treatment for MRSA infections. Performed at [facility], [place] [address], [place], [place] [address]       Radiology Studies: DG CHEST PORT 1 VIEW  Result Date: 10/09/2020 CLINICAL DATA:  Shortness of breath. EXAM: PORTABLE CHEST 1 VIEW COMPARISON:  10/07/2020. FINDINGS: Prior CABG. Stable mild cardiomegaly. Mild bilateral  pulmonary infiltrates/edema. Mild CHF cannot be excluded. Low lung volumes. No pleural effusion or pneumothorax. IMPRESSION: Prior CABG. Stable mild cardiomegaly. Mild bilateral pulmonary infiltrates/edema. Mild CHF cannot be excluded. Electronically Signed   By: Marcello Moores  Register   On: 10/09/2020 05:33    Scheduled Meds: . (feeding supplement) PROSource Plus  30 mL Oral BID BM  . apixaban  2.5 mg Oral BID  . Chlorhexidine Gluconate Cloth  6 each Topical Daily  . feeding supplement  237 mL Oral BID BM  . insulin aspart  0-6 Units Subcutaneous TID WC  . multivitamin with minerals  1 tablet Oral Daily  . sodium chloride flush  3 mL Intravenous Q12H  . tamsulosin  0.4 mg Oral Daily   Continuous Infusions: . ceFEPime (MAXIPIME) IV Stopped (10/09/20 1030)     LOS: 2 days   Time spent: 25 minutes.  Patrecia Pour, MD [facility] [email or web address] 10/09/2020, 7:03 PM

## 2020-10-09 NOTE — Plan of Care (Signed)

## 2020-10-09 NOTE — Evaluation (Signed)
SLP Cancellation Note  Patient Details Name: [name] MRN: [id number] DOB: [birth year]   Cancelled treatment:       Reason Eval/Treat Not Completed: Other (comment);Patient declined, no reason specified (Pt declined to have evaluation completed after multiple explanation of reasoning for evaluation ordered, please reorder if pt agreeable.  Pt denies dysphagia.  Pt reports frustration at having many MDs involved in his care, explained need for specialists to address multiple areas of medical deficiencies at this time.  Thanks.)  Kathleen Lime, MS [facility] SLP Acute Rehab Services Office [phone] Pager [phone]   Macario Golds 10/09/2020, 10:51 AM

## 2020-10-10 DIAGNOSIS — Z515 Encounter for palliative care: Secondary | ICD-10-CM

## 2020-10-10 DIAGNOSIS — R531 Weakness: Secondary | ICD-10-CM

## 2020-10-10 DIAGNOSIS — Z7189 Other specified counseling: Secondary | ICD-10-CM

## 2020-10-10 LAB — CBC
HCT: 25.5 % — ABNORMAL LOW (ref 39.0–52.0)
Hemoglobin: 8.1 g/dL — ABNORMAL LOW (ref 13.0–17.0)
MCH: 27 pg (ref 26.0–34.0)
MCHC: 31.8 g/dL (ref 30.0–36.0)
MCV: 85 fL (ref 80.0–100.0)
Platelets: 367 10*3/uL (ref 150–400)
RBC: 3 MIL/uL — ABNORMAL LOW (ref 4.22–5.81)
RDW: 15.6 % — ABNORMAL HIGH (ref 11.5–15.5)
WBC: 7.8 10*3/uL (ref 4.0–10.5)
nRBC: 0 % (ref 0.0–0.2)

## 2020-10-10 LAB — URINE CULTURE: Culture: >=100000 — AB

## 2020-10-10 LAB — BASIC METABOLIC PANEL
Anion gap: 11 (ref 5–15)
BUN: 29 mg/dL — ABNORMAL HIGH (ref 8–23)
CO2: 18 mmol/L — ABNORMAL LOW (ref 22–32)
Calcium: 8.6 mg/dL — ABNORMAL LOW (ref 8.9–10.3)
Chloride: 110 mmol/L (ref 98–111)
Creatinine, Ser: 1.61 mg/dL — ABNORMAL HIGH (ref 0.61–1.24)
GFR, Estimated: 47 mL/min — ABNORMAL LOW (ref >60)
Glucose, Bld: 113 mg/dL — ABNORMAL HIGH (ref 70–99)
Potassium: 3.2 mmol/L — ABNORMAL LOW (ref 3.5–5.1)
Sodium: 139 mmol/L (ref 135–145)

## 2020-10-10 LAB — GLUCOSE, CAPILLARY
Glucose-Capillary: 104 mg/dL — ABNORMAL HIGH (ref 70–99)
Glucose-Capillary: 180 mg/dL — ABNORMAL HIGH (ref 70–99)
Glucose-Capillary: 129 mg/dL — ABNORMAL HIGH (ref 70–99)
Glucose-Capillary: 130 mg/dL — ABNORMAL HIGH (ref 70–99)

## 2020-10-10 LAB — MAGNESIUM: Magnesium: 1.8 mg/dL (ref 1.7–2.4)

## 2020-10-10 MED ORDER — POTASSIUM CHLORIDE CRYS ER 20 MEQ PO TBCR
40.0000 meq | EXTENDED_RELEASE_TABLET | Freq: Once | ORAL | Status: AC
  Administered 2020-10-10: 40 meq via ORAL
  Filled 2020-10-10: qty 2

## 2020-10-10 MED ORDER — CEFAZOLIN SODIUM-DEXTROSE 1-4 GM/50ML-% IV SOLN
1.0000 g | Freq: Two times a day (BID) | INTRAVENOUS | Status: DC
  Administered 2020-10-10: 1 g via INTRAVENOUS
  Filled 2020-10-10: qty 50

## 2020-10-10 NOTE — Care Management Important Message (Signed)
Important Message  Patient Details IM Letter given to the Patient Name: [name] MRN: [id number] Date of Birth: Jan 31, 1956   Medicare Important Message Given:  Yes     Kerin Salen 10/10/2020, 11:05 AM

## 2020-10-10 NOTE — Progress Notes (Signed)
HEMATOLOGY-ONCOLOGY PROGRESS NOTE  SUBJECTIVE: The patient is sitting up in the recliner today.  He reports that he is trying to eat better.  Denies abdominal pain, nausea, vomiting, hematuria.  Afebrile.  Urine culture positive for staph aureus.  Blood cultures negative to date.  Remains on antibiotics.  The patient has been seen by urology for the bladder mass with severe bilateral hydroureteronephrosis.  They plan for outpatient evaluation.  Oncology History  Multiple myeloma (HCC)  11/06/2019 Initial Diagnosis   Multiple myeloma (HCC)   11/14/2019 -  Chemotherapy    Patient is on Treatment Plan: MYELOMA DARATUMUMAB Q28D         REVIEW OF SYSTEMS:   Constitutional: Denies fevers, chills.  15 pound weight loss over about 2 weeks.  The patient is try to eat better. Eyes: Denies blurriness of vision Ears, nose, mouth, throat, and face: Denies mucositis or sore throat Respiratory: Denies cough, dyspnea or wheezes Cardiovascular: Denies palpitation, chest discomfort Gastrointestinal:  Denies nausea, heartburn or change in bowel habits Skin: Denies abnormal skin rashes Lymphatics: Denies new lymphadenopathy or easy bruising Neurological:Denies numbness, tingling or new weaknesses Behavioral/Psych: Mood is stable, no new changes  Extremities: No lower extremity edema All other systems were reviewed with the patient and are negative.  I have reviewed the past medical history, past surgical history, social history and family history with the patient and they are unchanged from previous note.   PHYSICAL EXAMINATION: ECOG PERFORMANCE STATUS: 1 - Symptomatic but completely ambulatory  Vitals:   10/09/20 2032 10/10/20 0538  BP: 118/63 (!) 108/57  Pulse: 87 90  Resp: (!) 21 20  Temp: 99.3 F (37.4 C) 99.2 F (37.3 C)  SpO2: 100% 100%   Filed Weights   10/09/20 0427 10/10/20 0500  Weight: 66.4 kg 61.7 kg    Intake/Output from previous day: 03/30 0701 - 03/31 0700 In: 440  [P.O.:240; IV Piggyback:200] Out: 2900 [Urine:2900]  GENERAL: Awake and alert, no distress SKIN: skin color, texture, turgor are normal, no rashes or significant lesions EYES: normal, Conjunctiva are pink and non-injected, sclera clear OROPHARYNX:no exudate, no erythema and lips, buccal mucosa, and tongue normal  LUNGS: clear to auscultation and percussion with normal breathing effort HEART: regular rate & rhythm and no murmurs and no lower extremity edema ABDOMEN:abdomen soft, non-tender and normal bowel sounds. Musculoskeletal:no cyanosis of digits and no clubbing  NEURO: alert & oriented x 3 with fluent speech, no focal motor/sensory deficits  LABORATORY DATA:  I have reviewed the data as listed CMP Latest Ref Rng & Units 10/10/2020 10/09/2020 10/08/2020  Glucose 70 - 99 mg/dL 518(Z) 81 83  BUN 8 - 23 mg/dL 35(O) 25(P) 89(Q)  Creatinine 0.61 - 1.24 mg/dL 4.21(I) 3.12(O) 1.18(A)  Sodium 135 - 145 mmol/L 139 138 138  Potassium 3.5 - 5.1 mmol/L 3.2(L) 3.4(L) 3.4(L)  Chloride 98 - 111 mmol/L 110 111 115(H)  CO2 22 - 32 mmol/L 18(L) 14(L) 16(L)  Calcium 8.9 - 10.3 mg/dL 6.7(R) 3.7(V) 8.3(L)  Total Protein 6.5 - 8.1 g/dL - 5.5(L) 5.8(L)  Total Bilirubin 0.3 - 1.2 mg/dL - 0.9 0.8  Alkaline Phos 38 - 126 U/L - 49 55  AST 15 - 41 U/L - 8(L) 12(L)  ALT 0 - 44 U/L - 8 11    Lab Results  Component Value Date   WBC 7.8 10/10/2020   HGB 8.1 (L) 10/10/2020   HCT 25.5 (L) 10/10/2020   MCV 85.0 10/10/2020   PLT 367 10/10/2020   NEUTROABS  5.8 10/09/2020    CT ABDOMEN PELVIS WO CONTRAST  Result Date: 10/07/2020 CLINICAL DATA:  Abdominal pain, fever EXAM: CT ABDOMEN AND PELVIS WITHOUT CONTRAST TECHNIQUE: Multidetector CT imaging of the abdomen and pelvis was performed following the standard protocol without IV contrast. COMPARISON:  PET-CT 10/25/2019 FINDINGS: Lower chest: Airways thickening and scattered secretions in the lower lobe airways. Some additional mixed solid and ground-glass  opacities present in the right lung base. Could reflect acute infection or inflammation or sequela of aspiration. Cardiomegaly. Hypoattenuation of the cardiac blood pool compatible with relative anemia. Three-vessel coronary artery atherosclerosis. No pericardial effusion. Hepatobiliary: No visible focal liver lesion. Smooth surface contour. Normal hepatic attenuation. Normal gallbladder and biliary tree without visible calcified gallstone. Pancreas: No pancreatic ductal dilatation or surrounding inflammatory changes. Spleen: Normal in size. No concerning splenic lesions. Adrenals/Urinary Tract: Adrenal. Previously seen left upper pole hyperdense cyst is no longer visible. No new or concerning focal renal lesions. Severe bilateral hydroureteronephrosis to the level of an irregularly thickened, lobular shaped urinary bladder with marked irregular mural thickening as well as more masslike extension from the bladder dome C2 several adjacent loops small bowel and colon best seen coronal image 3/58. Additional layering density is seen within the bladder lumen. Indentation of bladder base by an enlarged prostate as well. Extensive surrounding inflammatory changes of the kidneys, ureters and urinary bladder. Stomach/Bowel: Distal esophagus, stomach and duodenum are unremarkable. No proximal small bowel thickening or dilatation. Portion of the small bowel closely apposing the irregular lobular extension of the bladder dome with loss of clearly discernible fat plane. No resulting obstruction. Similar site irregular fat plane loss noted in the descending and proximal sigmoid colon as well with some mild mural thickening of the adjacent colonic segment. Normal appendix. No bowel obstruction. Vascular/Lymphatic: Atherosclerotic calcifications within the abdominal aorta and branch vessels. No aneurysm or ectasia. No enlarged abdominopelvic lymph nodes. Reproductive: Enlarged prostate with median lobe hypertrophy indenting the  bladder base. Other: Inflammatory changes centered upon the bladder, kidneys and ureters as above. No extraluminal fluid or gas is evident. No bowel containing hernia. Small fat containing umbilical and bilateral inguinal hernias. Musculoskeletal: The osseous structures appear diffusely demineralized which may limit detection of small or nondisplaced fractures. Transitional lumbosacral vertebrae partially fused with the adjacent sacrum and ilia. Bony fusion across the bilateral SI joints. Mild dextrocurvature of the spine apex at the L1 level. Additional multilevel degenerative changes in the hips and pelvis. No acute osseous abnormality or suspicious osseous lesion. IMPRESSION: 1. Severe bilateral hydroureteronephrosis to the level of an irregularly thickened, lobular urinary bladder with marked irregular mural thickening as well as more masslike extension from the bladder dome which closely apposes several adjacent loops of thickened small bowel and colon with loss of discernible fat plane. Overall appearance is new from comparison PET-CT 10/25/2019. Highly worrisome for a primary bladder malignancy with resulting obstructive uropathy and likely superimposed infection. Fistulization with the adjacent bowel loops is not completely excluded though less favored in the absence of intraluminal gas. Additional layering density is seen within the bladder lumen, could reflect blood products or proteinaceous debris. 2. Enlarged prostate with median lobe hypertrophy indenting the bladder base. 3. Airways thickening and scattered secretions in the lower lobe airways with additional mixed solid and ground-glass opacities in the right lung base, could reflect acute infection or inflammation or sequela of aspiration. 4. Aortic Atherosclerosis (ICD10-I70.0). These results were called by telephone at the time of interpretation on 10/07/2020 at 6:16 pm to  provider Marva Panda , who verbally acknowledged these results.  Electronically Signed   By: Lovena Le M.D.   On: 10/07/2020 18:14   DG CHEST PORT 1 VIEW  Result Date: 10/09/2020 CLINICAL DATA:  Shortness of breath. EXAM: PORTABLE CHEST 1 VIEW COMPARISON:  10/07/2020. FINDINGS: Prior CABG. Stable mild cardiomegaly. Mild bilateral pulmonary infiltrates/edema. Mild CHF cannot be excluded. Low lung volumes. No pleural effusion or pneumothorax. IMPRESSION: Prior CABG. Stable mild cardiomegaly. Mild bilateral pulmonary infiltrates/edema. Mild CHF cannot be excluded. Electronically Signed   By: Marcello Moores  Register   On: 10/09/2020 05:33   DG Chest Port 1 View  Result Date: 10/07/2020 CLINICAL DATA:  Cough EXAM: PORTABLE CHEST 1 VIEW COMPARISON:  05/24/2020 FINDINGS: Mild cardiomegaly status post median sternotomy. Both lungs are clear. The visualized skeletal structures are unremarkable. IMPRESSION: Mild cardiomegaly without acute abnormality of the lungs in AP portable projection. Electronically Signed   By: Eddie Candle M.D.   On: 10/07/2020 13:16    ASSESSMENT AND PLAN:  65 yo with   1) High risk Multiple myeloma -09/27/19 M Protein at 3.4 -10/25/2019 PET/CT ([id number]) revealed "1. No specific myelomatous lesions are identified. 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   2) Anemia ? Related to CKD vs ? Plasma cell dyscrasia  3) CKD ?etiology - HTN ? Cardiac issues ? Myeloma  related.  4) recently diagnosed small Pulmonary embolism  5) Hypokalemia  6) bladder mass  7) severe bilateral hydroureteronephrosis  PLAN: -Lab work from today has been reviewed which shows hemoglobin is up to 8.1, WBC and platelets are normal, potassium 3.2, BUN 29, creatinine 1.61, calcium 8.6. -Urology consult note has been reviewed and they plan for outpatient evaluation of the bladder mass and bilateral hydroureteronephrosis. -We will need evaluation of the bladder mass before resuming treatment for his multiple myeloma.  I explained to the patient that we will need to hold daratumumab for now. -Based on last myeloma labs performed in February 2022, he is in remission from multiple myeloma standpoint. -He has been receiving maintenance daratumumab every 2 weeks. -Recommend PRBC transfusion for hemoglobin less than 7.5.  Recommend use of least incompatible blood. -Continue renal dose of Eliquis for history of PE. -Antibiotics per hospitalist for treatment of UTI.   LOS: 3 days   Joseph Bussing, DNP, AGPCNP-BC, AOCNP 10/10/20

## 2020-10-10 NOTE — Evaluation (Addendum)
Physical Therapy Evaluation Patient Details Name: [name] MRN: [id number] DOB: December 17, 1955 Today's Date: 10/10/2020   History of Present Illness  65 year old male with past medical history of multiple myeloma (in remission), HTN, hyperlipidemia, diabetes, RA, CKD, CHF, PE with current c/o poor p.o. intake for several weeks and 15 pound weight loss in 13 days. CT found bladder mass with obstructive uropathy, bil severe hydroureteroneophrosis  Clinical Impression  Pt admitted with above diagnosis. At baseline, pt ambulatory without AD, lives at home with spouse. Pt expresses frustration with current functional status, verbalizes not wanting to be in hospital and frustration with lack of OOB mobility. Therapist offered listening ear and encouragement with fair response. Pt currently requiring min to mod A to power up from seated surface and min A for supine to sit to upright trunk. Pt ambulates limited distance in room using RW, min A to steady, limited by fatigue and requests to sit in chair. Cued pt through SLR while sitting up in chair and educated to perform additional reps later today, pt verbalized understanding. RN notified regarding pt's frustrations and verbalizes understanding. Recommending SNF due to current functional status, possibly home with HHPT and family to assist pending progress and family availability. Pt currently with functional limitations due to the deficits listed below (see PT Problem List). Pt will benefit from skilled PT to increase their independence and safety with mobility to allow discharge to the venue listed below.       Follow Up Recommendations SNF    Equipment Recommendations  None recommended by PT (TBD)    Recommendations for Other Services       Precautions / Restrictions Precautions Precautions: Fall Restrictions Weight Bearing Restrictions: No      Mobility  Bed Mobility Overal bed mobility: Needs Assistance Bed Mobility: Supine to  Sit  Supine to sit: Min assist  General bed mobility comments: min A to upright trunk to sitting, able to mobilize BLE over to EOB with cues    Transfers Overall transfer level: Needs assistance Equipment used: Rolling walker (2 wheeled) Transfers: Sit to/from Stand Sit to Stand: Min assist;Mod assist  General transfer comment: min A to rise from elevated bed, mod A to rise from low toilet, cues for hand placement to avoid pulling on RW to rise, weight posterior upon rising requiring cues to shift anterior  Ambulation/Gait Ambulation/Gait assistance: Min assist Gait Distance (Feet): 20 Feet Assistive device: Rolling walker (2 wheeled) Gait Pattern/deviations: Step-to pattern;Decreased stride length;Trunk flexed Gait velocity: decreased   General Gait Details: short steps with RW, min A to maneuver RW and slightly unsteady, no overt LOB, limited by fatigue  Stairs            Wheelchair Mobility    Modified Rankin (Stroke Patients Only)       Balance Overall balance assessment: Needs assistance Sitting-balance support: Feet supported Sitting balance-Leahy Scale: Fair Sitting balance - Comments: seated EOB   Standing balance support: During functional activity;Bilateral upper extremity supported Standing balance-Leahy Scale: Poor Standing balance comment: reliant on UE support          Pertinent Vitals/Pain Pain Assessment: No/denies pain    Home Living Family/patient expects to be discharged to:: Private residence Living Arrangements: Spouse/significant other Available Help at Discharge: Family;Available PRN/intermittently Type of Home: House Home Access: Stairs to enter Entrance Stairs-Rails: Right;Left   Home Layout: Two level;Bed/bath upstairs Home Equipment: None Additional Comments: all home information obtained per chart review, pt frustrated and unwilling to discuss,  no family present to answer questions    Prior Function Level of Independence:  Independent  Comments: Pt reports independent with ADLs, ambulatory, drives     Hand Dominance   Dominant Hand: Right    Extremity/Trunk Assessment   Upper Extremity Assessment Upper Extremity Assessment: Overall WFL for tasks assessed    Lower Extremity Assessment Lower Extremity Assessment: Generalized weakness (AROM WNL, strength grossly 3+/5 throughout, denies numbness/tingling throughout)    Cervical / Trunk Assessment Cervical / Trunk Assessment: Kyphotic  Communication   Communication: No difficulties  Cognition Arousal/Alertness: Awake/alert Behavior During Therapy: WFL for tasks assessed/performed;Agitated Overall Cognitive Status: Within Functional Limits for tasks assessed  General Comments: Pt extensively verbalizes frustration with hospital stay due to lack of OOB mobility, declines to answer home set up questions, responds fairly to therapist encouragement and understanding.      General Comments      Exercises General Exercises - Lower Extremity Straight Leg Raises: Seated;AROM;Strengthening;Both;5 reps   Assessment/Plan    PT Assessment Patient needs continued PT services  PT Problem List Decreased strength;Decreased activity tolerance;Decreased balance;Decreased mobility;Decreased knowledge of use of DME;Decreased safety awareness       PT Treatment Interventions DME instruction;Gait training;Functional mobility training;Therapeutic activities;Therapeutic exercise;Balance training;Patient/family education    PT Goals (Current goals can be found in the Care Plan section)  Acute Rehab PT Goals Patient Stated Goal: "go to the bathroom" PT Goal Formulation: With patient Time For Goal Achievement: 10/24/20 Potential to Achieve Goals: Good    Frequency Min 2X/week   Barriers to discharge        Co-evaluation               AM-PAC PT "6 Clicks" Mobility  Outcome Measure Help needed turning from your back to your side while in a flat bed  without using bedrails?: None Help needed moving from lying on your back to sitting on the side of a flat bed without using bedrails?: A Little Help needed moving to and from a bed to a chair (including a wheelchair)?: A Little Help needed standing up from a chair using your arms (e.g., wheelchair or bedside chair)?: A Lot Help needed to walk in hospital room?: A Little Help needed climbing 3-5 steps with a railing? : A Lot 6 Click Score: 17    End of Session Equipment Utilized During Treatment: Gait belt Activity Tolerance: Patient tolerated treatment well;Patient limited by fatigue Patient left: in chair;with call bell/phone within reach;with chair alarm set Nurse Communication: Mobility status;Other (comment) (frustration with hospitalization, lack of OOB mobility) PT Visit Diagnosis: Other abnormalities of gait and mobility (R26.89);Muscle weakness (generalized) (M62.81)    Time: 7741-2878 PT Time Calculation (min) (ACUTE ONLY): 34 min   Charges:   PT Evaluation $PT Eval Low Complexity: 1 Low PT Treatments $Therapeutic Activity: 8-22 mins         Tori Daisee Centner PT, DPT 10/10/20, 1:16 PM

## 2020-10-10 NOTE — Progress Notes (Signed)
PROGRESS NOTE  [name]  NOM:[id number] DOB: 04-22-1956 DOA: 10/07/2020 PCP: Isaac Bliss, Rayford Halsted, MD   Brief Narrative: [name] is a 65 y.o. male with a history of multiple myeloma in remission, RA, T2DM, HTN, HLD, stage IIIb CKD, chronic combined HFrEF, PE on eliquis who presented to the ED on the advice of oncology who noted 15 lbs weight loss associated with anorexia. In the ED he was febrile with leukocytosis (WBC 12.3k), and CT abd/pelvis revealing severe bilateral hydroureteronephrosis and an irregularly thickened, lobular urinary bladder with marked irregular mural thickening as well as more masslike extension from the bladder dome which closely apposes several adjacent loops of thickened small bowel and colon with loss of discernible fat plane.    Assessment & Plan: Principal Problem:   Failure to thrive in adult Active Problems:   Uncontrolled type 2 diabetes mellitus with hyperglycemia (HCC)   Multiple myeloma (HCC)   Sepsis ([place])   Pulmonary embolism ([place])   Hypertension   Pressure injury of skin   Bladder mass  Severe sepsis due to MSSA UTI. Doubt pneumonia in the absence of cough or infiltrate.  - Monitor blood and urine cultures - Change cefepime > ancef.   Bladder mass with obstructive uropathy, bilateral severe hydroureteronephrosis, BPH:  - Urology consulted - Urine output is good with foley catheter. Will continue until urology follow up.  - Palliative care consulted with this in light of recent FTT. Appreciate their assessment.   Weight loss, debility:  - PT, dietitian consulted. Will require rehabilitation at facility which both the patient and wife are amenable to. TOC consulted.   Stage IIIb CKD with metabolic acidosis: - Severity of hydronephrosis with minimal impact to renal function suggests longer chronicity.   History of PE:  - Continue anticoagulation, back to renal dose eliquis.   Multifactorial anemia: Related to  multiple myeloma and AOCD, also possible blood loss anemia from bladder mass with RBC on UA micro.  - Monitor CBC in AM with suggested transfusion threshold of 7.5g/dl per oncology with least incompatible blood product.   Hypokalemia, hypomagnesemia:  - Supplement again today and monitor  HTN:  - Hold antihypertensives for now  T2DM:  - SSI  Chronic HFpEF:  - Monitor volume status  Multiple myeloma in remission:  - Continue daratumumab q2weeks per Dr. Irene Limbo.   RN Pressure Injury Documentation: Pressure Injury 10/08/20 Sacrum Mid;Lower Stage 1 -  Intact skin with non-blanchable redness of a localized area usually over a bony prominence. (Active)  10/08/20 1718  Location: Sacrum  Location Orientation: Mid;Lower  Staging: Stage 1 -  Intact skin with non-blanchable redness of a localized area usually over a bony prominence.  Wound Description (Comments):   Present on Admission: Yes   DVT prophylaxis: Eliquis Code Status: Full Family Communication: Wife at bedside on PM rounds Disposition Plan:  Status is: Inpatient  Remains inpatient appropriate because:Inpatient level of care appropriate due to severity of illness   Dispo: The patient is from: Home              Anticipated d/c is to: SNF 4/1              Patient currently is not medically stable to d/c.   Difficult to place patient No  Consultants:   Urology  Oncology  Palliative care  Procedures:   Foley catheter  Antimicrobials:  Vancomycin, cefepime, flagyl >>> cefepime monotherapy   Subjective: Felt it was difficult to get up to chair  with PT today. No pain to speak of. Urine output is good, foley not bothering him. Eating a bit better. Amenable to SNF for rehabilitation prior to returning home.   Objective: Vitals:   10/10/20 0500 10/10/20 0538 10/10/20 1406 10/10/20 1955  BP:  (!) 108/57 101/65 (!) 144/67  Pulse:  90 93 96  Resp:  $Remo'20 16 18  'UKbaC$ Temp:  99.2 F (37.3 C) 99.1 F (37.3 C) 98.2 F (36.8  C)  TempSrc:  Oral Oral Oral  SpO2:  100% 100% 99%  Weight: 61.7 kg     Height:        Intake/Output Summary (Last 24 hours) at 10/10/2020 1958 Last data filed at 10/10/2020 1542 Gross per 24 hour  Intake 800 ml  Output 2300 ml  Net -1500 ml   Filed Weights   10/09/20 0427 10/10/20 0500  Weight: 66.4 kg 61.7 kg   Gen: 65 y.o. male in no distress Pulm: Nonlabored breathing room air. Clear. CV: Regular rate and rhythm. No murmur, rub, or gallop. No JVD, no dependent edema. GI: Abdomen soft, non-tender, non-distended, with normoactive bowel sounds.  Ext: Warm, no deformities Skin: No rashes, lesions or ulcers on visualized skin. Neuro: Alert and oriented. No focal neurological deficits. Psych: Judgement and insight appear fair. Mood euthymic & affect congruent. Behavior is appropriate.    Data Reviewed: I have personally reviewed following labs and imaging studies  CBC: Recent Labs  Lab 10/07/20 0919 10/08/20 0747 10/09/20 0453 10/10/20 0408  WBC 12.3* 8.0 7.1 7.8  NEUTROABS 10.4*  --  5.8  --   HGB 9.1* 7.8* 7.6* 8.1*  HCT 28.5* 24.1* 24.9* 25.5*  MCV 83.3 83.7 87.7 85.0  PLT 317 284 303 975   Basic Metabolic Panel: Recent Labs  Lab 10/07/20 1000 10/08/20 0521 10/08/20 0747 10/09/20 0453 10/10/20 0408  NA 132*  --  138 138 139  K 3.7  --  3.4* 3.4* 3.2*  CL 103  --  115* 111 110  CO2 14*  --  16* 14* 18*  GLUCOSE 175*  --  83 81 113*  BUN 35*  --  34* 30* 29*  CREATININE 2.81* 2.41* 2.22* 1.87* 1.61*  CALCIUM 9.2  --  8.3* 8.7* 8.6*  MG  --   --   --  1.5* 1.8  PHOS  --   --   --  2.7  --    GFR: Estimated Creatinine Clearance: 39.9 mL/min (A) (by C-G formula based on SCr of 1.61 mg/dL (H)). Liver Function Tests: Recent Labs  Lab 10/07/20 1000 10/08/20 0747 10/09/20 0453  AST 19 12* 8*  ALT $Re'16 11 8  'OOT$ ALKPHOS 82 55 49  BILITOT 0.5 0.8 0.9  PROT 7.2 5.8* 5.5*  ALBUMIN 2.5* 2.2* 2.3*   No results for input(s): LIPASE, AMYLASE in the last 168  hours. No results for input(s): AMMONIA in the last 168 hours. Coagulation Profile: No results for input(s): INR, PROTIME in the last 168 hours. Cardiac Enzymes: No results for input(s): CKTOTAL, CKMB, CKMBINDEX, TROPONINI in the last 168 hours. BNP (last 3 results) No results for input(s): PROBNP in the last 8760 hours. HbA1C: No results for input(s): HGBA1C in the last 72 hours. CBG: Recent Labs  Lab 10/09/20 1755 10/09/20 2028 10/10/20 0801 10/10/20 1151 10/10/20 1737  GLUCAP 92 135* 104* 180* 129*   Lipid Profile: No results for input(s): CHOL, HDL, LDLCALC, TRIG, CHOLHDL, LDLDIRECT in the last 72 hours. Thyroid Function Tests: No results for input(s):  TSH, T4TOTAL, FREET4, T3FREE, THYROIDAB in the last 72 hours. Anemia Panel: No results for input(s): VITAMINB12, FOLATE, FERRITIN, TIBC, IRON, RETICCTPCT in the last 72 hours. Urine analysis:    Component Value Date/Time   COLORURINE AMBER (A) 10/07/2020 1843   APPEARANCEUR CLOUDY (A) 10/07/2020 1843   LABSPEC 1.009 10/07/2020 1843   PHURINE 7.0 10/07/2020 1843   GLUCOSEU NEGATIVE 10/07/2020 1843   HGBUR MODERATE (A) 10/07/2020 1843   BILIRUBINUR NEGATIVE 10/07/2020 1843   KETONESUR NEGATIVE 10/07/2020 1843   PROTEINUR 100 (A) 10/07/2020 1843   NITRITE NEGATIVE 10/07/2020 1843   LEUKOCYTESUR LARGE (A) 10/07/2020 1843   Recent Results (from the past 240 hour(s))  Culture, blood (Routine X 2) w Reflex to ID Panel     Status: None (Preliminary result)   Collection Time: 10/07/20 12:55 PM   Specimen: BLOOD  Result Value Ref Range Status   Specimen Description   Final    BLOOD LEFT ANTECUBITAL Performed at [facility], [place] [address] [place], [place] [address]    Special Requests   Final    BOTTLES DRAWN AEROBIC AND ANAEROBIC Blood Culture adequate volume Performed at [place] [address] [place], [place] [address]    Culture   Final    NO GROWTH 3 DAYS Performed at  [facility], [place] [address], [place], [place] [address]    Report Status PENDING  Incomplete  Resp Panel by RT-PCR (Flu A&B, Covid) Nasopharyngeal Swab     Status: None   Collection Time: 10/07/20  1:02 PM   Specimen: Nasopharyngeal Swab; Nasopharyngeal(NP) swabs in vial transport medium  Result Value Ref Range Status   SARS Coronavirus 2 by RT PCR NEGATIVE NEGATIVE Final    Comment: (NOTE) SARS-CoV-2 target nucleic acids are NOT DETECTED.  The SARS-CoV-2 RNA is generally detectable in upper respiratory specimens during the acute phase of infection. The lowest concentration of SARS-CoV-2 viral copies this assay can detect is 138 copies/mL. A negative result does not preclude SARS-Cov-2 infection and should not be used as the sole basis for treatment or other patient management decisions. A negative result may occur with  improper specimen collection/handling, submission of specimen other than nasopharyngeal swab, presence of viral mutation(s) within the areas targeted by this assay, and inadequate number of viral copies(<138 copies/mL). A negative result must be combined with clinical observations, patient history, and epidemiological information. The expected result is Negative.  Fact Sheet for Patients:  EntrepreneurPulse.com.au  Fact Sheet for Healthcare Providers:  IncredibleEmployment.be  This test is no t yet approved or cleared by the [place] FDA and  has been authorized for detection and/or diagnosis of SARS-CoV-2 by FDA under an Emergency Use Authorization (EUA). This EUA will remain  in effect (meaning this test can be used) for the duration of the COVID-19 declaration under Section 564(b)(1) of the Act, 21 U.S.C.section 360bbb-3(b)(1), unless the authorization is terminated  or revoked sooner.       Influenza A by PCR NEGATIVE NEGATIVE Final   Influenza B by PCR NEGATIVE NEGATIVE Final    Comment: (NOTE) The Xpert  Xpress SARS-CoV-2/FLU/RSV plus assay is intended as an aid in the diagnosis of influenza from Nasopharyngeal swab specimens and should not be used as a sole basis for treatment. Nasal washings and aspirates are unacceptable for Xpert Xpress SARS-CoV-2/FLU/RSV testing.  Fact Sheet for Patients: EntrepreneurPulse.com.au  Fact Sheet for Healthcare Providers: IncredibleEmployment.be  This test is not yet approved or cleared by the  [place] Architectural technologist and has been authorized for detection and/or diagnosis of SARS-CoV-2 by FDA under an Print production planner (EUA). This EUA will remain in effect (meaning this test can be used) for the duration of the COVID-19 declaration under Section 564(b)(1) of the Act, 21 U.S.C. section 360bbb-3(b)(1), unless the authorization is terminated or revoked.  Performed at [facility], [place] [address] [place], [place] [address]   Urine Culture     Status: Abnormal   Collection Time: 10/07/20  6:43 PM   Specimen: Urine, Clean Catch  Result Value Ref Range Status   Specimen Description   Final    URINE, CLEAN CATCH Performed at [facility], [place] [address], [place], [place] [address]    Special Requests   Final    NONE Performed at [facility], [place] [address] [place], [place] [address]    Culture >=100,000 COLONIES/mL STAPHYLOCOCCUS AUREUS (A)  Final   Report Status 10/10/2020 FINAL  Final   Organism ID, Bacteria STAPHYLOCOCCUS AUREUS (A)  Final      Susceptibility   Staphylococcus aureus - MIC*    CIPROFLOXACIN <=0.5 SENSITIVE Sensitive     GENTAMICIN <=0.5 SENSITIVE Sensitive     NITROFURANTOIN <=16 SENSITIVE Sensitive     OXACILLIN <=0.25 SENSITIVE Sensitive     TETRACYCLINE <=1 SENSITIVE Sensitive     VANCOMYCIN <=0.5 SENSITIVE Sensitive     TRIMETH/SULFA <=10 SENSITIVE Sensitive     CLINDAMYCIN <=0.25 SENSITIVE Sensitive     RIFAMPIN <=0.5  SENSITIVE Sensitive     Inducible Clindamycin NEGATIVE Sensitive     * >=100,000 COLONIES/mL STAPHYLOCOCCUS AUREUS  MRSA PCR Screening     Status: None   Collection Time: 10/08/20 11:27 AM   Specimen: Nasopharyngeal  Result Value Ref Range Status   MRSA by PCR NEGATIVE NEGATIVE Final    Comment:        The GeneXpert MRSA Assay (FDA approved for NASAL specimens only), is one component of a comprehensive MRSA colonization surveillance program. It is not intended to diagnose MRSA infection nor to guide or monitor treatment for MRSA infections. Performed at [facility], [place] [address] [place], [place] [address]       Radiology Studies: DG CHEST PORT 1 VIEW  Result Date: 10/09/2020 CLINICAL DATA:  Shortness of breath. EXAM: PORTABLE CHEST 1 VIEW COMPARISON:  10/07/2020. FINDINGS: Prior CABG. Stable mild cardiomegaly. Mild bilateral pulmonary infiltrates/edema. Mild CHF cannot be excluded. Low lung volumes. No pleural effusion or pneumothorax. IMPRESSION: Prior CABG. Stable mild cardiomegaly. Mild bilateral pulmonary infiltrates/edema. Mild CHF cannot be excluded. Electronically Signed   By: Marcello Moores  Register   On: 10/09/2020 05:33    Scheduled Meds: . (feeding supplement) PROSource Plus  30 mL Oral BID BM  . apixaban  2.5 mg Oral BID  . Chlorhexidine Gluconate Cloth  6 each Topical Daily  . feeding supplement  237 mL Oral BID BM  . insulin aspart  0-6 Units Subcutaneous TID WC  . multivitamin with minerals  1 tablet Oral Daily  . sodium chloride flush  3 mL Intravenous Q12H  . tamsulosin  0.4 mg Oral Daily   Continuous Infusions: .  ceFAZolin (ANCEF) IV       LOS: 3 days   Time spent: 25 minutes.  Patrecia Pour, MD [facility] [email or web address] 10/10/2020, 7:58 PM

## 2020-10-10 NOTE — Consult Note (Signed)
Consultation Note Date: 10/10/2020   Patient Name: [name]  DOB: 02/28/1956  MRN: [id number]  Age / Sex: 65 y.o., male  PCP: Isaac Bliss, Rayford Halsted, MD Referring Physician: Patrecia Pour, MD  Reason for Consultation: Establishing goals of care  HPI/Patient Profile: 65 y.o. male  admitted on 10/07/2020   Clinical Assessment and Goals of Care:  [name] is a 65 y.o. male with a history of multiple myeloma in remission, RA, T2DM, HTN, HLD, stage IIIb CKD, chronic combined HFrEF, PE on eliquis who presented to the ED due to weight loss and anorexia. In the ED he was febrile with leukocytosis (WBC 12.3k), and CT abd/pelvis revealing severe bilateral hydroureteronephrosis and an irregularly thickened, lobular urinary bladder with marked irregular mural thickening as well as more masslike extension from the bladder dome which closely apposes several adjacent loops of thickened small bowel and colon with loss of discernible fat plane.   Patient remains admitted to hospital medicine service, he has been diagnosed with sepsis due to UTI, imaging shows possible bladder mass with obstructive uropathy, bilateral severe hydroureteronephrosis.   Palliative consult for code status and goals of care has been requested.   Patient is resting in bed, I introduced myself and palliative care as follows: Palliative medicine is specialized medical care for people living with serious illness. It focuses on providing relief from the symptoms and stress of a serious illness. The goal is to improve quality of life for both the patient and the family.  Goals of care: Broad aims of medical therapy in relation to the patient's values and preferences. Our aim is to provide medical care aimed at enabling patients to achieve the goals that matter most to them, given the circumstances of their particular medical  situation and their constraints.   Patient answers a few questions appropriately, he doesn't verbalize much, he denies pain or other complaints. See below.    NEXT OF KIN  wife.   SUMMARY OF RECOMMENDATIONS    Full Code, full scope care as per patient preference, he declines my offer to get in touch with his wife, he states he will get in touch with her. Patient wishes to continue current scope of care.  PT notes reviewed, SNF rehab with palliative care is recommended.  No further PMT specific recommendations at this time, thank you for the consult.   Code Status/Advance Care Planning:  Full code    Symptom Management:      Palliative Prophylaxis:   Delirium Protocol  Additional Recommendations (Limitations, Scope, Preferences):  Full Scope Treatment  Psycho-social/Spiritual:   Desire for further Chaplaincy support:yes  Additional Recommendations: Caregiving  Support/Resources  Prognosis:   Unable to determine  Discharge Planning: To Be Determined      Primary Diagnoses: Present on Admission: . Failure to thrive in adult . Multiple myeloma ([place]) . Hypertension . Pulmonary embolism ([place]) . Uncontrolled type 2 diabetes mellitus with hyperglycemia ([place])   I have reviewed the medical record, interviewed the patient and family, and  examined the patient. The following aspects are pertinent.  Past Medical History:  Diagnosis Date  . Anemia   . Arthritis    "left knee" (11/24/2017)  . CAD (coronary artery disease)    CABG 2002  . Cancer [facility])    Multiple Myeloma  . CHF (congestive heart failure) ([place])   . Chronic kidney disease   . Chronic renal insufficiency   . Diabetes mellitus without complication ([place])   . Dilated cardiomyopathy ([place])    echo in 2009 showed improvement with a normal EF  . HTN (hypertension)   . Hyperlipemia   . Hypertension   . Noncompliance   . PAD (peripheral artery disease) ([place])   . Pneumonia    Social History    Socioeconomic History  . Marital status: Married    Spouse name: Not on file  . Number of children: 1  . Years of education: Not on file  . Highest education level: Not on file  Occupational History  . Occupation: Furniture conservator/restorer  Tobacco Use  . Smoking status: Never Smoker  . Smokeless tobacco: Never Used  Vaping Use  . Vaping Use: Never used  Substance and Sexual Activity  . Alcohol use: Not Currently  . Drug use: Not Currently  . Sexual activity: Yes  Other Topics Concern  . Not on file  Social History Narrative   ** Merged History Encounter **       Social Determinants of Health   Financial Resource Strain: Not on file  Food Insecurity: Not on file  Transportation Needs: Not on file  Physical Activity: Not on file  Stress: Not on file  Social Connections: Not on file   Family History  Problem Relation Age of Onset  . Hypertension Father   . Diabetes Mother        DIABETIC COMA  . Healthy Daughter    Scheduled Meds: . (feeding supplement) PROSource Plus  30 mL Oral BID BM  . apixaban  2.5 mg Oral BID  . Chlorhexidine Gluconate Cloth  6 each Topical Daily  . feeding supplement  237 mL Oral BID BM  . insulin aspart  0-6 Units Subcutaneous TID WC  . multivitamin with minerals  1 tablet Oral Daily  . sodium chloride flush  3 mL Intravenous Q12H  . tamsulosin  0.4 mg Oral Daily   Continuous Infusions: . ceFEPime (MAXIPIME) IV 2 g (10/10/20 1010)   PRN Meds:. Medications Prior to Admission:  Prior to Admission medications   Medication Sig Start Date End Date Taking? Authorizing Provider  amLODipine (NORVASC) 5 MG tablet Take 5 mg by mouth daily. 01/09/20  Yes [provider]  aspirin EC 81 MG tablet Take 81 mg by mouth daily. Swallow whole.   Yes [provider]  atorvastatin (LIPITOR) 80 MG tablet Take 1 tablet by mouth once daily Patient taking differently: Take 80 mg by mouth daily. 06/25/20  Yes [place], Peter M, MD  carvedilol (COREG) 25 MG  tablet Take 25 mg by mouth 2 (two) times daily with a meal.   Yes [provider]  dexamethasone (DECADRON) 4 MG tablet Take 12 mg by mouth See admin instructions. Take as directed after chemo treatments   Yes [provider]  hydrALAZINE (APRESOLINE) 50 MG tablet TAKE 1 TABLET BY MOUTH THREE TIMES DAILY Patient taking differently: Take 50 mg by mouth 3 (three) times daily. 06/25/20  Yes [place], Peter M, MD  isosorbide mononitrate (IMDUR) 30 MG 24 hr tablet Take 1 tablet (30  mg total) by mouth daily. 04/10/20  Yes Burtis Junes, NP  lactose free nutrition (BOOST) LIQD Take 237 mLs by mouth as needed (for supplementation).   Yes [provider]  prochlorperazine (COMPAZINE) 10 MG tablet Take 1 tablet (10 mg total) by mouth every 6 (six) hours as needed (Nausea or vomiting). Patient taking differently: Take 10 mg by mouth every 6 (six) hours as needed for nausea or vomiting. 11/06/19  Yes Brunetta Genera, MD  repaglinide (PRANDIN) 1 MG tablet TAKE 1 TABLET BY MOUTH THREE TIMES DAILY BEFORE MEAL(S) Patient taking differently: Take 1 mg by mouth 3 (three) times daily before meals. 08/30/20  Yes Elayne Snare, MD  Accu-Chek Softclix Lancets lancets Use Accu Chek softclix lancets to check blood sugar fasting or 2 hours after a meal every other day. 11/23/19   Elayne Snare, MD  acyclovir (ZOVIRAX) 400 MG tablet Take 0.5 tablets (200 mg total) by mouth 2 (two) times daily. 11/06/19   Brunetta Genera, MD  apixaban (ELIQUIS) 2.5 MG TABS tablet Take 1 tablet (2.5 mg total) by mouth 2 (two) times daily. Patient not taking: No sig reported 07/02/20   Brunetta Genera, MD  Blood Glucose Monitoring Suppl (ACCU-CHEK GUIDE ME) w/Device KIT 1 each by Does not apply route.    [provider]  glucose blood (ACCU-CHEK GUIDE) test strip Use Accu Chek test strips as instructed to check blood sugar fasting or two hours after meals, every other day. 08/18/19   Elayne Snare, MD   losartan (COZAAR) 100 MG tablet Take 1 tablet by mouth once daily Patient not taking: No sig reported 07/29/20   Burtis Junes, NP  potassium chloride SA (KLOR-CON) 20 MEQ tablet Take 1 tablet by mouth once daily Patient not taking: No sig reported 05/13/20   Elayne Snare, MD  senna-docusate (SENNA S) 8.6-50 MG tablet Take 2 tablets by mouth at bedtime. Patient not taking: Reported on 10/07/2020 07/15/20   Brunetta Genera, MD   Allergies  Allergen Reactions  . Metformin And Related Diarrhea   Review of Systems Denies pain.   Physical Exam Awake alert No distress Denies pain Regular work of breathing S 1 S 2  Abdomen not tender Non focal  Vital Signs: BP (!) 108/57 (BP Location: Right Arm)   Pulse 90   Temp 99.2 F (37.3 C) (Oral)   Resp 20   Ht $R'5\' 9"'zN$  (1.753 m) Comment: per patient report  Wt 61.7 kg   SpO2 100%   BMI 20.09 kg/m  Pain Scale: 0-10   Pain Score: 0-No pain   SpO2: SpO2: 100 % O2 Device:SpO2: 100 % O2 Flow Rate: .   IO: Intake/output summary:   Intake/Output Summary (Last 24 hours) at 10/10/2020 1358 Last data filed at 10/10/2020 1200 Gross per 24 hour  Intake 440 ml  Output 2800 ml  Net -2360 ml    LBM: Last BM Date: 10/09/20 Baseline Weight: Weight: 66.4 kg Most recent weight: Weight: 61.7 kg     Palliative Assessment/Data:   Flowsheet Rows   Flowsheet Row Most Recent Value  Intake Tab   Referral Department Hospitalist  Unit at Time of Referral ER  Date Notified 10/07/20  Palliative Care Type New Palliative care  Reason for referral Clarify Goals of Care  Date of Admission 10/07/20  # of days IP prior to Palliative referral 0  Clinical Assessment   Psychosocial & Spiritual Assessment   Palliative Care Outcomes     PPS 50%  Time In:  1300  Time Out:  1400 Time Total:  60 min.  Greater than 50%  of this time was spent counseling and coordinating care related to the above assessment and plan.  Signed by: Loistine Chance, MD    Please contact Palliative Medicine Team phone at [phone] for questions and concerns.  For individual provider: See Shea Evans

## 2020-10-11 LAB — BASIC METABOLIC PANEL
Anion gap: 10 (ref 5–15)
BUN: 24 mg/dL — ABNORMAL HIGH (ref 8–23)
CO2: 16 mmol/L — ABNORMAL LOW (ref 22–32)
Calcium: 8.7 mg/dL — ABNORMAL LOW (ref 8.9–10.3)
Chloride: 113 mmol/L — ABNORMAL HIGH (ref 98–111)
Creatinine, Ser: 1.35 mg/dL — ABNORMAL HIGH (ref 0.61–1.24)
GFR, Estimated: 58 mL/min — ABNORMAL LOW (ref >60)
Glucose, Bld: 127 mg/dL — ABNORMAL HIGH (ref 70–99)
Potassium: 3.2 mmol/L — ABNORMAL LOW (ref 3.5–5.1)
Sodium: 139 mmol/L (ref 135–145)

## 2020-10-11 LAB — RESP PANEL BY RT-PCR (FLU A&B, COVID) ARPGX2
Influenza A by PCR: NEGATIVE
Influenza B by PCR: NEGATIVE
SARS Coronavirus 2 by RT PCR: NEGATIVE

## 2020-10-11 LAB — GLUCOSE, CAPILLARY
Glucose-Capillary: 128 mg/dL — ABNORMAL HIGH (ref 70–99)
Glucose-Capillary: 156 mg/dL — ABNORMAL HIGH (ref 70–99)

## 2020-10-11 MED ORDER — CEPHALEXIN 500 MG PO CAPS: 500.0000 mg | ORAL_CAPSULE | Freq: Three times a day (TID) | ORAL | 0 refills | Status: AC

## 2020-10-11 MED ORDER — CEFAZOLIN SODIUM-DEXTROSE 1-4 GM/50ML-% IV SOLN
1.0000 g | Freq: Three times a day (TID) | INTRAVENOUS | Status: DC
  Administered 2020-10-11: 1 g via INTRAVENOUS
  Filled 2020-10-11 (×2): qty 50

## 2020-10-11 MED ORDER — TAMSULOSIN HCL 0.4 MG PO CAPS: 0.4000 mg | ORAL_CAPSULE | Freq: Every day | ORAL | Status: DC

## 2020-10-11 NOTE — NC FL2 (Signed)
[place] MEDICAID FL2 LEVEL OF CARE SCREENING TOOL     IDENTIFICATION  Patient Name: [name] Birthdate: 08/31/1955 Sex: male Admission Date (Current Location): 10/07/2020  [facility] and [place] Number:  Producer, television/film/video and Address:  [facility],  [address] [place], [place] [address]      Provider Number: [id number]  Attending Physician Name and Address:  Tyrone Nine, MD  Relative Name and Phone Number:  JIBRAN, CROOKSHANKS   [phone]    Current Level of Care: Hospital Recommended Level of Care: Skilled Nursing Facility Prior Approval Number:    Date Approved/Denied:   PASRR Number: [id number]  Discharge Plan: SNF    Current Diagnoses: Patient Active Problem List   Diagnosis Date Noted  . Pressure injury of skin 10/09/2020  . Bladder mass   . Failure to thrive in adult 10/07/2020  . Flu vaccine need 06/04/2020  . Iron deficiency anemia 06/04/2020  . Rheumatoid arthritis involving multiple sites (HCC) 04/17/2020  . Aspiration pneumonia (HCC) 03/13/2020  . Sepsis (HCC) 03/13/2020  . Pulmonary embolism (HCC) 03/13/2020  . AKI (acute kidney injury) (HCC) 03/13/2020  . Anemia 03/13/2020  . Multiple myeloma (HCC) 03/13/2020  . Hypertension 03/13/2020  . Acute hypoxemic respiratory failure (HCC) 03/13/2020  . Multiple myeloma (HCC) 11/06/2019  . Counseling regarding advance care planning and goals of care 11/06/2019  . Uncontrolled type 2 diabetes mellitus with hyperglycemia (HCC) 09/19/2018  . Acute on chronic combined systolic and diastolic CHF (congestive heart failure) (HCC) 11/26/2017  . Hyperlipemia 01/07/2011  . CAD (coronary artery disease)   . Dilated cardiomyopathy (HCC)   . HTN (hypertension)   . PAD (peripheral artery disease) (HCC)   . Chronic renal insufficiency     Orientation RESPIRATION BLADDER Height & Weight     Self,Time,Situation,Place  Normal External catheter Weight: 59.9 kg Height:  5\' 9"  (175.3 cm)  (per patient report)  BEHAVIORAL SYMPTOMS/MOOD NEUROLOGICAL BOWEL NUTRITION STATUS      Continent Diet (Heart Healthy)  AMBULATORY STATUS COMMUNICATION OF NEEDS Skin   Extensive Assist Verbally Normal                       Personal Care Assistance Level of Assistance  Bathing,Feeding,Dressing Bathing Assistance: Limited assistance Feeding assistance: Limited assistance Dressing Assistance: Limited assistance     Functional Limitations Info  Sight,Hearing,Speech Sight Info: Adequate Hearing Info: Adequate Speech Info: Adequate    SPECIAL CARE FACTORS FREQUENCY  PT (By licensed PT),OT (By licensed OT)     PT Frequency: 5x week OT Frequency: 5x week            Contractures Contractures Info: Present    Additional Factors Info  Code Status,Allergies Code Status Info: FULL Allergies Info: Metformin And Related           Current Medications (10/11/2020):  This is the current hospital active medication list Current Facility-Administered Medications  Medication Dose Route Frequency Provider Last Rate Last Admin  . (feeding supplement) PROSource Plus liquid 30 mL  30 mL Oral BID BM 12/11/2020, MD   30 mL at 10/10/20 2003  . apixaban (ELIQUIS) tablet 2.5 mg  2.5 mg Oral BID 2004, MD   2.5 mg at 10/11/20 12/11/20  . ceFAZolin (ANCEF) IVPB 1 g/50 mL premix  1 g Intravenous Q8H 1051, RPH 100 mL/hr at 10/11/20 0554 Infusion Verify at 10/11/20 0554  . Chlorhexidine Gluconate Cloth 2 % PADS 6 each  6 each Topical Daily Raiford Noble [place], [place]   6 each at 10/11/20 0601  . feeding supplement (ENSURE ENLIVE / ENSURE PLUS) liquid 237 mL  237 mL Oral BID BM Patrecia Pour, MD   237 mL at 10/11/20 0926  . insulin aspart (novoLOG) injection 0-6 Units  0-6 Units Subcutaneous TID WC Harold Hedge, MD      . multivitamin with minerals tablet 1 tablet  1 tablet Oral Daily Harold Hedge, MD   1 tablet at 10/11/20 [phone]  . sodium chloride flush (NS) 0.9 % injection 3  mL  3 mL Intravenous Q12H Harold Hedge, MD   3 mL at 10/11/20 0925  . tamsulosin (FLOMAX) capsule 0.4 mg  0.4 mg Oral Daily Raiford Noble Latif, DO   0.4 mg at 10/11/20 3794   Facility-Administered Medications Ordered in Other Encounters  Medication Dose Route Frequency Provider Last Rate Last Admin  . sodium chloride flush (NS) 0.9 % injection 10 mL  10 mL Intravenous PRN Brunetta Genera, MD      . sodium chloride flush (NS) 0.9 % injection 10 mL  10 mL Intracatheter Once PRN Brunetta Genera, MD         Discharge Medications: Please see discharge summary for a list of discharge medications.  Relevant Imaging Results:  Relevant Lab Results:   Additional Information SS#[id number]  Purcell Mouton, RN

## 2020-10-11 NOTE — TOC Progression Note (Signed)
Transition of Care [facility]) - Progression Note    Patient Details  Name: [name] MRN: [id number] Date of Birth: 22-Feb-1956  Transition of [facility]) CM/SW Contact  Purcell Mouton, RN Phone Number: 10/11/2020, 11:52 AM  Clinical Narrative:    Novella Olive for a 4 PM pick up to go to [facility].    Expected Discharge Plan: [place] Barriers to Discharge: No Barriers Identified  Expected Discharge Plan and Services Expected Discharge Plan: [place] arrangements for the past 2 months: Single Family Home Expected Discharge Date: 10/11/20                                     Social Determinants of Health (SDOH) Interventions    Readmission Risk Interventions No flowsheet data found.

## 2020-10-11 NOTE — Plan of Care (Signed)

## 2020-10-11 NOTE — Discharge Summary (Signed)
Physician Discharge Summary  [name] RWE:[id number] DOB: May 03, 1956 DOA: 10/07/2020  PCP: Isaac Bliss, Rayford Halsted, MD  Admit date: 10/07/2020 Discharge date: 10/11/2020  Admitted From: Home Disposition: SNF   Recommendations for Outpatient Follow-up:  1. Follow up with [facility] in 2-3 weeks for trial of voiding and planning for cystoscopy.  2. Suggest monitoring BMP, CBC in 1-2 weeks.  Home Health: N/A Equipment/Devices: Per SNF Discharge Condition: Stable CODE STATUS: Full Diet recommendation: Heart healthy, carb-modified  Brief/Interim Summary: [name] is a 65 y.o. male with a history of multiple myeloma in remission, RA, T2DM, HTN, HLD, stage IIIb CKD, chronic combined HFrEF, PE on eliquis who presented to the ED on the advice of oncology who noted 15 lbs weight loss associated with anorexia. In the ED he was febrile with leukocytosis (WBC 12.3k), creatinine elevated to 2.81, and CT abd/pelvis revealing severe bilateral hydroureteronephrosis and an irregularly thickened, lobular urinary bladder with marked irregular mural thickening as well as more masslike extension from the bladder dome which closely apposes several adjacent loops of thickened small bowel and colon with loss of discernible fat plane.   He was started on IV fluids and antibiotics for sepsis with urine culture revealing MSSA. Urology was consulted, advising continuing foley catheter until follow up in urology clinic after treatment of sepsis. They will schedule cystoscopy at that time. With placement of foley catheter, creatinine has improved drastically to 1.35 on day of discharge. Leukocytosis has resolved and the patient is afebrile. The patient will benefit from physical rehabilitation prior to returning home which is arranged.  Discharge Diagnoses:  Principal Problem:   Failure to thrive in adult Active Problems:   Uncontrolled type 2 diabetes mellitus with hyperglycemia (HCC)    Multiple myeloma (HCC)   Sepsis ([place])   Pulmonary embolism ([place])   Hypertension   Pressure injury of skin   Bladder mass  Severe sepsis due to MSSA UTI. Doubt pneumonia in the absence of cough or infiltrate.  - Blood cultures NGTD, though with severity of sepsis on presentation and foley catheter placement, will continue culture-directed antibiotics x14 days with keflex x10 more days.  Bladder mass with obstructive uropathy, bilateral severe hydroureteronephrosis, BPH:  - Urine output is good with foley catheter. Will continue until urology follow up. Dr. Gloriann Loan of [facility] evaluated the patient and recommended outpatient follow up after treatment of sepsis for cystoscopy.  - Continue flomax - Palliative care consulted with this in light of recent FTT. Appreciate their assessment.   Weight loss, debility:  - PT, dietitian consulted. Will require rehabilitation at facility which both the patient and wife are amenable to.  Stage IIIb CKD with metabolic acidosis: - Improved.  Recently diagnosed PE:  - Continue renal dose eliquis.   Multifactorial anemia: Related to multiple myeloma and AOCD, also possible blood loss anemia from bladder mass with RBC on UA micro.  - H/H stable without transfusion, suggest recheck in 1-2 weeks.   Hypokalemia, hypomagnesemia:  - Continue potassium supplementation  HTN:  - Hold antihypertensives for now  T2DM:  - Continue prandin  Chronic HFpEF:  - Monitor volume status  Multiple myeloma in remission:  - Has been on daratumumab q2weeks per Dr. Irene Limbo. This will need to be delayed until evaluation of bladder mass per oncology.  RN Pressure Injury Documentation: Pressure Injury 10/08/20 Sacrum Mid;Lower Stage 1 -  Intact skin with non-blanchable redness of a localized area usually over a bony prominence. (Active)  10/08/20 1718  Location: Sacrum  Location Orientation: Mid;Lower  Staging: Stage 1 -  Intact skin with non-blanchable  redness of a localized area usually over a bony prominence.  Wound Description (Comments):   Present on Admission: Yes   Discharge Instructions  Allergies as of 10/11/2020      Reactions   Metformin And Related Diarrhea      Medication List    STOP taking these medications   losartan 100 MG tablet Commonly known as: COZAAR   senna-docusate 8.6-50 MG tablet Commonly known as: Senna S     TAKE these medications   Accu-Chek Guide Me w/Device Kit 1 each by Does not apply route.   Accu-Chek Guide test strip Generic drug: glucose blood Use Accu Chek test strips as instructed to check blood sugar fasting or two hours after meals, every other day.   Accu-Chek Softclix Lancets lancets Use Accu Chek softclix lancets to check blood sugar fasting or 2 hours after a meal every other day.   acyclovir 400 MG tablet Commonly known as: ZOVIRAX Take 0.5 tablets (200 mg total) by mouth 2 (two) times daily.   amLODipine 5 MG tablet Commonly known as: NORVASC Take 5 mg by mouth daily.   apixaban 2.5 MG Tabs tablet Commonly known as: ELIQUIS Take 1 tablet (2.5 mg total) by mouth 2 (two) times daily.   aspirin EC 81 MG tablet Take 81 mg by mouth daily. Swallow whole.   atorvastatin 80 MG tablet Commonly known as: LIPITOR Take 1 tablet by mouth once daily   carvedilol 25 MG tablet Commonly known as: COREG Take 25 mg by mouth 2 (two) times daily with a meal.   cephALEXin 500 MG capsule Commonly known as: KEFLEX Take 1 capsule (500 mg total) by mouth 3 (three) times daily for 10 days.   dexamethasone 4 MG tablet Commonly known as: DECADRON Take 12 mg by mouth See admin instructions. Take as directed after chemo treatments   hydrALAZINE 50 MG tablet Commonly known as: APRESOLINE TAKE 1 TABLET BY MOUTH THREE TIMES DAILY   isosorbide mononitrate 30 MG 24 hr tablet Commonly known as: IMDUR Take 1 tablet (30 mg total) by mouth daily.   lactose free nutrition Liqd Take 237 mLs  by mouth as needed (for supplementation).   potassium chloride SA 20 MEQ tablet Commonly known as: KLOR-CON Take 1 tablet by mouth once daily   prochlorperazine 10 MG tablet Commonly known as: COMPAZINE Take 1 tablet (10 mg total) by mouth every 6 (six) hours as needed (Nausea or vomiting). What changed: reasons to take this   repaglinide 1 MG tablet Commonly known as: PRANDIN TAKE 1 TABLET BY MOUTH THREE TIMES DAILY BEFORE MEAL(S) What changed: See the new instructions.   tamsulosin 0.4 MG Caps capsule Commonly known as: FLOMAX Take 1 capsule (0.4 mg total) by mouth daily. Start taking on: October 12, 2020       Follow-up Information    Isaac Bliss, Rayford Halsted, MD Follow up.   Specialty: Internal Medicine Contact information: [place] [address] [phone]        Lucas Mallow, MD. Schedule an appointment as soon as possible for a visit in 2 week(s).   Specialty: Urology Contact information: [place] [address] [phone]              Allergies  Allergen Reactions  . Metformin And Related Diarrhea    Consultations:  Urology  Palliative care  Oncology  Procedures/Studies: CT ABDOMEN PELVIS WO CONTRAST  Result Date: 10/07/2020 CLINICAL DATA:  Abdominal pain, fever EXAM: CT ABDOMEN AND PELVIS WITHOUT CONTRAST TECHNIQUE: Multidetector CT imaging of the abdomen and pelvis was performed following the standard protocol without IV contrast. COMPARISON:  PET-CT 10/25/2019 FINDINGS: Lower chest: Airways thickening and scattered secretions in the lower lobe airways. Some additional mixed solid and ground-glass opacities present in the right lung base. Could reflect acute infection or inflammation or sequela of aspiration. Cardiomegaly. Hypoattenuation of the cardiac blood pool compatible with relative anemia. Three-vessel coronary artery atherosclerosis. No pericardial effusion. Hepatobiliary: No visible focal  liver lesion. Smooth surface contour. Normal hepatic attenuation. Normal gallbladder and biliary tree without visible calcified gallstone. Pancreas: No pancreatic ductal dilatation or surrounding inflammatory changes. Spleen: Normal in size. No concerning splenic lesions. Adrenals/Urinary Tract: Adrenal. Previously seen left upper pole hyperdense cyst is no longer visible. No new or concerning focal renal lesions. Severe bilateral hydroureteronephrosis to the level of an irregularly thickened, lobular shaped urinary bladder with marked irregular mural thickening as well as more masslike extension from the bladder dome C2 several adjacent loops small bowel and colon best seen coronal image 3/58. Additional layering density is seen within the bladder lumen. Indentation of bladder base by an enlarged prostate as well. Extensive surrounding inflammatory changes of the kidneys, ureters and urinary bladder. Stomach/Bowel: Distal esophagus, stomach and duodenum are unremarkable. No proximal small bowel thickening or dilatation. Portion of the small bowel closely apposing the irregular lobular extension of the bladder dome with loss of clearly discernible fat plane. No resulting obstruction. Similar site irregular fat plane loss noted in the descending and proximal sigmoid colon as well with some mild mural thickening of the adjacent colonic segment. Normal appendix. No bowel obstruction. Vascular/Lymphatic: Atherosclerotic calcifications within the abdominal aorta and branch vessels. No aneurysm or ectasia. No enlarged abdominopelvic lymph nodes. Reproductive: Enlarged prostate with median lobe hypertrophy indenting the bladder base. Other: Inflammatory changes centered upon the bladder, kidneys and ureters as above. No extraluminal fluid or gas is evident. No bowel containing hernia. Small fat containing umbilical and bilateral inguinal hernias. Musculoskeletal: The osseous structures appear diffusely demineralized which  may limit detection of small or nondisplaced fractures. Transitional lumbosacral vertebrae partially fused with the adjacent sacrum and ilia. Bony fusion across the bilateral SI joints. Mild dextrocurvature of the spine apex at the L1 level. Additional multilevel degenerative changes in the hips and pelvis. No acute osseous abnormality or suspicious osseous lesion. IMPRESSION: 1. Severe bilateral hydroureteronephrosis to the level of an irregularly thickened, lobular urinary bladder with marked irregular mural thickening as well as more masslike extension from the bladder dome which closely apposes several adjacent loops of thickened small bowel and colon with loss of discernible fat plane. Overall appearance is new from comparison PET-CT 10/25/2019. Highly worrisome for a primary bladder malignancy with resulting obstructive uropathy and likely superimposed infection. Fistulization with the adjacent bowel loops is not completely excluded though less favored in the absence of intraluminal gas. Additional layering density is seen within the bladder lumen, could reflect blood products or proteinaceous debris. 2. Enlarged prostate with median lobe hypertrophy indenting the bladder base. 3. Airways thickening and scattered secretions in the lower lobe airways with additional mixed solid and ground-glass opacities in the right lung base, could reflect acute infection or inflammation or sequela of aspiration. 4. Aortic Atherosclerosis (ICD10-I70.0). These results were called by telephone at the time of interpretation on 10/07/2020 at 6:16 pm to provider JARED SEGAL ,  who verbally acknowledged these results. Electronically Signed   By: Lovena Le M.D.   On: 10/07/2020 18:14   DG CHEST PORT 1 VIEW  Result Date: 10/09/2020 CLINICAL DATA:  Shortness of breath. EXAM: PORTABLE CHEST 1 VIEW COMPARISON:  10/07/2020. FINDINGS: Prior CABG. Stable mild cardiomegaly. Mild bilateral pulmonary infiltrates/edema. Mild CHF cannot be  excluded. Low lung volumes. No pleural effusion or pneumothorax. IMPRESSION: Prior CABG. Stable mild cardiomegaly. Mild bilateral pulmonary infiltrates/edema. Mild CHF cannot be excluded. Electronically Signed   By: Marcello Moores  Register   On: 10/09/2020 05:33   DG Chest Port 1 View  Result Date: 10/07/2020 CLINICAL DATA:  Cough EXAM: PORTABLE CHEST 1 VIEW COMPARISON:  05/24/2020 FINDINGS: Mild cardiomegaly status post median sternotomy. Both lungs are clear. The visualized skeletal structures are unremarkable. IMPRESSION: Mild cardiomegaly without acute abnormality of the lungs in AP portable projection. Electronically Signed   By: Eddie Candle M.D.   On: 10/07/2020 13:16   Subjective: Feels well. No dyspnea, leg swelling. Urinating robustly. No abd pain. Eating better. No fevers.   Discharge Exam: Vitals:   10/10/20 1955 10/11/20 0504  BP: (!) 144/67 112/67  Pulse: 96 95  Resp: 18 18  Temp: 98.2 F (36.8 C) 97.8 F (36.6 C)  SpO2: 99% 100%   General: Pt is alert, awake, not in acute distress Cardiovascular: RRR, S1/S2 +, no rubs, no gallops Respiratory: CTA bilaterally, no wheezing, no rhonchi Abdominal: Soft, NT, ND, bowel sounds + Extremities: No edema, no cyanosis  Labs: BNP (last 3 results) Recent Labs    03/13/20 0642  BNP 732.2*   Basic Metabolic Panel: Recent Labs  Lab 10/07/20 1000 10/08/20 0521 10/08/20 0747 10/09/20 0453 10/10/20 0408 10/11/20 0400  NA 132*  --  138 138 139 139  K 3.7  --  3.4* 3.4* 3.2* 3.2*  CL 103  --  115* 111 110 113*  CO2 14*  --  16* 14* 18* 16*  GLUCOSE 175*  --  83 81 113* 127*  BUN 35*  --  34* 30* 29* 24*  CREATININE 2.81* 2.41* 2.22* 1.87* 1.61* 1.35*  CALCIUM 9.2  --  8.3* 8.7* 8.6* 8.7*  MG  --   --   --  1.5* 1.8  --   PHOS  --   --   --  2.7  --   --    Liver Function Tests: Recent Labs  Lab 10/07/20 1000 10/08/20 0747 10/09/20 0453  AST 19 12* 8*  ALT $Re'16 11 8  'Rle$ ALKPHOS 82 55 49  BILITOT 0.5 0.8 0.9  PROT 7.2 5.8*  5.5*  ALBUMIN 2.5* 2.2* 2.3*   No results for input(s): LIPASE, AMYLASE in the last 168 hours. No results for input(s): AMMONIA in the last 168 hours. CBC: Recent Labs  Lab 10/07/20 0919 10/08/20 0747 10/09/20 0453 10/10/20 0408  WBC 12.3* 8.0 7.1 7.8  NEUTROABS 10.4*  --  5.8  --   HGB 9.1* 7.8* 7.6* 8.1*  HCT 28.5* 24.1* 24.9* 25.5*  MCV 83.3 83.7 87.7 85.0  PLT 317 284 303 367   Cardiac Enzymes: No results for input(s): CKTOTAL, CKMB, CKMBINDEX, TROPONINI in the last 168 hours. BNP: Invalid input(s): POCBNP CBG: Recent Labs  Lab 10/10/20 0801 10/10/20 1151 10/10/20 1737 10/10/20 2123 10/11/20 0745  GLUCAP 104* 180* 129* 130* 128*   D-Dimer No results for input(s): DDIMER in the last 72 hours. Hgb A1c No results for input(s): HGBA1C in the last 72 hours. Lipid Profile No results  for input(s): CHOL, HDL, LDLCALC, TRIG, CHOLHDL, LDLDIRECT in the last 72 hours. Thyroid function studies No results for input(s): TSH, T4TOTAL, T3FREE, THYROIDAB in the last 72 hours.  Invalid input(s): FREET3 Anemia work up No results for input(s): VITAMINB12, FOLATE, FERRITIN, TIBC, IRON, RETICCTPCT in the last 72 hours. Urinalysis    Component Value Date/Time   COLORURINE AMBER (A) 10/07/2020 1843   APPEARANCEUR CLOUDY (A) 10/07/2020 1843   LABSPEC 1.009 10/07/2020 1843   PHURINE 7.0 10/07/2020 1843   GLUCOSEU NEGATIVE 10/07/2020 1843   HGBUR MODERATE (A) 10/07/2020 1843   BILIRUBINUR NEGATIVE 10/07/2020 1843   KETONESUR NEGATIVE 10/07/2020 1843   PROTEINUR 100 (A) 10/07/2020 1843   NITRITE NEGATIVE 10/07/2020 1843   LEUKOCYTESUR LARGE (A) 10/07/2020 1843    Microbiology Recent Results (from the past 240 hour(s))  Culture, blood (Routine X 2) w Reflex to ID Panel     Status: None (Preliminary result)   Collection Time: 10/07/20 12:55 PM   Specimen: BLOOD  Result Value Ref Range Status   Specimen Description   Final    BLOOD LEFT ANTECUBITAL Performed at [facility], [place] [address], [place], [place] [address]    Special Requests   Final    BOTTLES DRAWN AEROBIC AND ANAEROBIC Blood Culture adequate volume Performed at [place] [address] [place], [place] [address]    Culture   Final    NO GROWTH 3 DAYS Performed at [facility], [place] [address] [place], [place] [address]    Report Status PENDING  Incomplete  Resp Panel by RT-PCR (Flu A&B, Covid) Nasopharyngeal Swab     Status: None   Collection Time: 10/07/20  1:02 PM   Specimen: Nasopharyngeal Swab; Nasopharyngeal(NP) swabs in vial transport medium  Result Value Ref Range Status   SARS Coronavirus 2 by RT PCR NEGATIVE NEGATIVE Final    Comment: (NOTE) SARS-CoV-2 target nucleic acids are NOT DETECTED.  The SARS-CoV-2 RNA is generally detectable in upper respiratory specimens during the acute phase of infection. The lowest concentration of SARS-CoV-2 viral copies this assay can detect is 138 copies/mL. A negative result does not preclude SARS-Cov-2 infection and should not be used as the sole basis for treatment or other patient management decisions. A negative result may occur with  improper specimen collection/handling, submission of specimen other than nasopharyngeal swab, presence of viral mutation(s) within the areas targeted by this assay, and inadequate number of viral copies(<138 copies/mL). A negative result must be combined with clinical observations, patient history, and epidemiological information. The expected result is Negative.  Fact Sheet for Patients:  EntrepreneurPulse.com.au  Fact Sheet for Healthcare Providers:  IncredibleEmployment.be  This test is no t yet approved or cleared by the [place] FDA and  has been authorized for detection and/or diagnosis of SARS-CoV-2 by FDA under an Emergency Use Authorization (EUA). This EUA will remain  in effect (meaning this test can be  used) for the duration of the COVID-19 declaration under Section 564(b)(1) of the Act, 21 U.S.C.section 360bbb-3(b)(1), unless the authorization is terminated  or revoked sooner.       Influenza A by PCR NEGATIVE NEGATIVE Final   Influenza B by PCR NEGATIVE NEGATIVE Final    Comment: (NOTE) The Xpert Xpress SARS-CoV-2/FLU/RSV plus assay is intended as an aid in the diagnosis of influenza from Nasopharyngeal swab specimens and should not be used as a sole basis for treatment. Nasal washings and aspirates are unacceptable for Xpert Xpress SARS-CoV-2/FLU/RSV  testing.  Fact Sheet for Patients: EntrepreneurPulse.com.au  Fact Sheet for Healthcare Providers: IncredibleEmployment.be  This test is not yet approved or cleared by the [place] FDA and has been authorized for detection and/or diagnosis of SARS-CoV-2 by FDA under an Emergency Use Authorization (EUA). This EUA will remain in effect (meaning this test can be used) for the duration of the COVID-19 declaration under Section 564(b)(1) of the Act, 21 U.S.C. section 360bbb-3(b)(1), unless the authorization is terminated or revoked.  Performed at [facility], [place] [address] [place], [place] [address]   Urine Culture     Status: Abnormal   Collection Time: 10/07/20  6:43 PM   Specimen: Urine, Clean Catch  Result Value Ref Range Status   Specimen Description   Final    URINE, CLEAN CATCH Performed at [facility], [place] [address], [place], [place] [address]    Special Requests   Final    NONE Performed at [facility], [place] [address], [place], [place] [address]    Culture >=100,000 COLONIES/mL STAPHYLOCOCCUS AUREUS (A)  Final   Report Status 10/10/2020 FINAL  Final   Organism ID, Bacteria STAPHYLOCOCCUS AUREUS (A)  Final      Susceptibility   Staphylococcus aureus - MIC*    CIPROFLOXACIN <=0.5 SENSITIVE Sensitive      GENTAMICIN <=0.5 SENSITIVE Sensitive     NITROFURANTOIN <=16 SENSITIVE Sensitive     OXACILLIN <=0.25 SENSITIVE Sensitive     TETRACYCLINE <=1 SENSITIVE Sensitive     VANCOMYCIN <=0.5 SENSITIVE Sensitive     TRIMETH/SULFA <=10 SENSITIVE Sensitive     CLINDAMYCIN <=0.25 SENSITIVE Sensitive     RIFAMPIN <=0.5 SENSITIVE Sensitive     Inducible Clindamycin NEGATIVE Sensitive     * >=100,000 COLONIES/mL STAPHYLOCOCCUS AUREUS  MRSA PCR Screening     Status: None   Collection Time: 10/08/20 11:27 AM   Specimen: Nasopharyngeal  Result Value Ref Range Status   MRSA by PCR NEGATIVE NEGATIVE Final    Comment:        The GeneXpert MRSA Assay (FDA approved for NASAL specimens only), is one component of a comprehensive MRSA colonization surveillance program. It is not intended to diagnose MRSA infection nor to guide or monitor treatment for MRSA infections. Performed at [facility], [place] [address] [place], [place] [address]     Time coordinating discharge: Approximately 40 minutes  Patrecia Pour, MD  [facility] 10/11/2020, 10:28 AM

## 2020-10-11 NOTE — Progress Notes (Signed)
Attempted to call report 3 times. Left message and no return call.

## 2020-10-11 NOTE — TOC Progression Note (Signed)
Transition of Care [facility]) - Progression Note    Patient Details  Name: Gehrig Patras MRN: [id number] Date of Birth: Jun 17, 1956  Transition of Care [facility] CM/SW Contact  Purcell Mouton, RN Phone Number: 10/11/2020, 9:56 AM  Clinical Narrative:     Spoke with pt concerning SNF for Rehab. Pt stated okay for PT. Asked if this CM could call his wife concerning SNF. Pt agreed. Pt's wife states that pt will need to go to SNF and asked for [facility]. Referral called to Admission Coordinator at [facility].   Expected Discharge Plan: [place] Barriers to Discharge: No Barriers Identified  Expected Discharge Plan and Services Expected Discharge Plan: [place] arrangements for the past 2 months: Single Family Home Expected Discharge Date: 10/11/20                                     Social Determinants of Health (SDOH) Interventions    Readmission Risk Interventions No flowsheet data found.

## 2020-10-12 LAB — CULTURE, BLOOD (ROUTINE X 2)
Culture: NO GROWTH
Special Requests: ADEQUATE

## 2020-10-18 NOTE — Progress Notes (Signed)
CARDIOLOGY OFFICE NOTE  Date:  10/23/2020    [name] Date of Birth: [birth year] Medical Record #[id number]  PCP:  Isaac Bliss, Rayford Halsted, MD  Cardiologist:  Dr Martinique  Chief Complaint  Patient presents with  . Coronary Artery Disease    History of Present Illness: Shan Valdes is a 65 y.o. male who presents today for a follow up visit.    He has a history ofCAD s/p CABG 2002, HTN,dilated cardiomyopathy, HLD andlong standingpast noncompliance with follow-up/meds. Echocardiogram in 2009 showed significant improvement in his LV function and EF was normal. Cardiac catheterization in 2006 demonstrated diseasebut he wasnot a candidate for further revascularization. Remote CTA abdomen showed 50% left renal artery stenosis in 2006 as well. Follow up renal duplex was ok.  He has been followed primarily by Truitt Merle NP- he tended to go long periods between visits despite our recommendations. He ended up being admitted in May of 2019 with marked HTN and CHF requiring aggressive diuresis. Echo showed EF of 50 to 55% with DD. He has been found to have diabetes - took several visits to convince him to see Endocrine. His medicines always tend to be unclear as to what he is exactly taking.  Whenseen back in February 2021- total protein elevated - sent to hematology - concern for MM. Noted significant weight loss.When seen back in April 2021- he had retired - he had followed thru with getting primary care, seeing hematology, Renal, etc.He noted he "had more time to go to the doctor since he had retired".He was to have bone marrow biopsy with Dr. Irene Limbo. He had not started his Norvasc that I had started at the visit prior - but otherwise, felt to be doing ok. When seen in June - he was doing ok - on infusions of Darzalex for his multiple myeloma. BP had improved with the addition of Norvasc. Had visit in October - had been admitted the month prior with  hypoxemia/UTI/sepsis - small PE - was on short course of Eliquis - ARB had been stopped. Unclear etiology of those events.   He was recently admitted 3/28-10/11/20: he presented to the ED on the advice of oncology who noted 15 lbs weight loss associated with anorexia. In the ED he was febrile with leukocytosis (WBC 12.3k), creatinine elevated to 2.81, and CT abd/pelvis revealing severe bilateral hydroureteronephrosis and an irregularly thickened, lobular urinary bladder with marked irregular mural thickening as well as more masslike extension from the bladder dome which closely apposes several adjacent loops of thickened small bowel and colon with loss of discernible fat plane.   He was started on IV fluids and antibiotics for sepsis with urine culture revealing MSSA. Urology was consulted, advising continuing foley catheter until follow up in urology clinic after treatment of sepsis. They will schedule cystoscopy at that time. With placement of foley catheter, creatinine has improved drastically to 1.35 on day of discharge. Leukocytosis has resolved and the patient is afebrile. He was DC to Rehab facility.  He has progressed with Rehab therapies. Weight and appetite have improved. Scheduled to see urology. Plans to be DC home next week. Denies any chest pain, palpitations, dyspnea or edema. No bleeding.         Past Medical History:  Diagnosis Date  . Anemia   . Arthritis    "left knee" (11/24/2017)  . CAD (coronary artery disease)    CABG 2002  . Cancer [facility])    Multiple Myeloma  .  CHF (congestive heart failure) ([place])   . Chronic kidney disease   . Chronic renal insufficiency   . Diabetes mellitus without complication ([place])   . Dilated cardiomyopathy ([place])    echo in 2009 showed improvement with a normal EF  . HTN (hypertension)   . Hyperlipemia   . Hypertension   . Noncompliance   . PAD (peripheral artery disease) ([place])   . Pneumonia     Past Surgical History:  Procedure  Laterality Date  . CARDIAC CATHETERIZATION  2002, 2006  . CORONARY ARTERY BYPASS GRAFT  2002   x4 DR. VAN TRIGT  . IR FLUORO GUIDED NEEDLE PLC ASPIRATION/INJECTION LOC  10/24/2019     Medications: Current Meds  Medication Sig  . Accu-Chek Softclix Lancets lancets Use Accu Chek softclix lancets to check blood sugar fasting or 2 hours after a meal every other day.  [name] acyclovir (ZOVIRAX) 400 MG tablet Take 0.5 tablets (200 mg total) by mouth 2 (two) times daily.  [name] amLODipine (NORVASC) 5 MG tablet Take 5 mg by mouth daily.  [name] apixaban (ELIQUIS) 2.5 MG TABS tablet Take 1 tablet (2.5 mg total) by mouth 2 (two) times daily.  [name] aspirin EC 81 MG tablet Take 81 mg by mouth daily. Swallow whole.  [name] atorvastatin (LIPITOR) 80 MG tablet Take 1 tablet by mouth once daily (Patient taking differently: Take 80 mg by mouth daily.)  . Blood Glucose Monitoring Suppl (ACCU-CHEK GUIDE ME) w/Device KIT 1 each by Does not apply route.  . carvedilol (COREG) 25 MG tablet Take 25 mg by mouth 2 (two) times daily with a meal.  . dexamethasone (DECADRON) 4 MG tablet Take 12 mg by mouth See admin instructions. Take as directed after chemo treatments  . glucose blood (ACCU-CHEK GUIDE) test strip Use Accu Chek test strips as instructed to check blood sugar fasting or two hours after meals, every other day.  . hydrALAZINE (APRESOLINE) 50 MG tablet TAKE 1 TABLET BY MOUTH THREE TIMES DAILY (Patient taking differently: Take 50 mg by mouth 3 (three) times daily.)  . isosorbide mononitrate (IMDUR) 30 MG 24 hr tablet Take 1 tablet (30 mg total) by mouth daily.  [name] lactose free nutrition (BOOST) LIQD Take 237 mLs by mouth as needed (for supplementation).  . potassium chloride SA (KLOR-CON) 20 MEQ tablet Take 1 tablet by mouth once daily  . prochlorperazine (COMPAZINE) 10 MG tablet Take 1 tablet (10 mg total) by mouth every 6 (six) hours as needed (Nausea or vomiting). (Patient taking differently: Take 10 mg by mouth every 6 (six)  hours as needed for nausea or vomiting.)  . repaglinide (PRANDIN) 1 MG tablet TAKE 1 TABLET BY MOUTH THREE TIMES DAILY BEFORE MEAL(S) (Patient taking differently: Take 1 mg by mouth 3 (three) times daily before meals.)  . tamsulosin (FLOMAX) 0.4 MG CAPS capsule Take 1 capsule (0.4 mg total) by mouth daily.     Allergies: Allergies  Allergen Reactions  . Metformin And Related Diarrhea    Social History: The patient  reports that he has never smoked. He has never used smokeless tobacco. He reports previous alcohol use. He reports previous drug use.   Family History: The patient's family history includes Diabetes in his mother; Healthy in his daughter; Hypertension in his father.   Review of Systems: Please see the history of present illness.   All other systems are reviewed and negative.   Physical Exam: VS:  BP 120/78   Pulse 83   Ht $R'5\' 9"'nE$  (1.753  m)   Wt 143 lb 3.2 oz (65 kg)   SpO2 99%   BMI 21.15 kg/m  .  BMI Body mass index is 21.15 kg/m.  Wt Readings from Last 3 Encounters:  10/23/20 143 lb 3.2 oz (65 kg)  10/11/20 132 lb 0.9 oz (59.9 kg)  10/07/20 137 lb 12.8 oz (62.5 kg)    General: Alert and in no acute distress. Very thin. Walks with a walker.  HEENT: Normal.  Neck: Supple, no JVD, carotid bruits, or masses noted.  Cardiac: Regular rate and rhythm. No murmurs, rubs, or gallops. No edema.  Respiratory:  Lungs are clear to auscultation bilaterally with normal work of breathing.  GI: Soft and nontender.  MS: No deformity or atrophy. Gait and ROM intact.  Skin: Warm and dry. Color is normal.  Neuro:  Strength and sensation are intact and no gross focal deficits noted.  Psych: Alert, appropriate and with normal affect.   LABORATORY DATA:  EKG:  EKG is not ordered today.    Lab Results  Component Value Date   WBC 7.8 10/10/2020   HGB 8.1 (L) 10/10/2020   HCT 25.5 (L) 10/10/2020   PLT 367 10/10/2020   GLUCOSE 127 (H) 10/11/2020   CHOL 92 (L) 09/04/2019    TRIG 89 09/04/2019   HDL 35 (L) 09/04/2019   LDLDIRECT 149.4 01/07/2011   LDLCALC 39 09/04/2019   ALT 8 10/09/2020   AST 8 (L) 10/09/2020   NA 139 10/11/2020   K 3.2 (L) 10/11/2020   CL 113 (H) 10/11/2020   CREATININE 1.35 (H) 10/11/2020   BUN 24 (H) 10/11/2020   CO2 16 (L) 10/11/2020   TSH 0.916 10/07/2020   INR 1.1 03/13/2020   HGBA1C 8.5 (A) 07/25/2020   MICROALBUR 12.6 (H) 07/21/2018     BNP (last 3 results) Recent Labs    03/13/20 0642  BNP 419.0*    ProBNP (last 3 results) No results for input(s): PROBNP in the last 8760 hours.   Other Studies Reviewed Today:  Echo 11/25/2017 LV EF: 50% - 55% Study Conclusions  - Left ventricle: The cavity size was normal. There was moderate concentric hypertrophy. Systolic function was normal. The estimated ejection fraction was in the range of 50% to 55%. Wall motion was normal; there were no regional wall motion abnormalities. Doppler parameters are consistent with abnormal left ventricular relaxation (grade 1 diastolic dysfunction). Doppler parameters are consistent with high ventricular filling pressure. - Aortic valve: Transvalvular velocity was within the normal range. There was no stenosis. There was no regurgitation. - Mitral valve: Transvalvular velocity was within the normal range. There was no evidence for stenosis. There was no regurgitation. - Left atrium: The atrium was moderately dilated. - Right ventricle: The cavity size was normal. Wall thickness was normal. Systolic function was normal. - Right atrium: The atrium was severely dilated. - Atrial septum: No defect or patent foramen ovale was identified by color flow Doppler. - Tricuspid valve: There was trivial regurgitation. - Pulmonary arteries: Systolic pressure was within the normal range. PA peak pressure: 36 mm Hg (S).  Echo 03/14/20: IMPRESSIONS    1. Left ventricular ejection fraction, by estimation, is 55 to 60%.  The  left ventricle has normal function. The left ventricle has no regional  wall motion abnormalities. Left ventricular diastolic parameters were  normal.  2. Right ventricular systolic function is normal. The right ventricular  size is normal.  3. Left atrial size was mildly dilated.  4. The mitral valve  is normal in structure. Trivial mitral valve  regurgitation. No evidence of mitral stenosis.  5. The aortic valve is normal in structure. Aortic valve regurgitation is  trivial. No aortic stenosis is present.  6. The inferior vena cava is normal in size with greater than 50%  respiratory variability, suggesting right atrial pressure of 3 mmHg.   Assessment & Plan:  1. Ischemic heart disease - managed medically - asymptomatic.   2. HTN - BP is well controlled on medication  3. Multiple Myeloma - per Oncology.  Patient reports he is in remission.  4. Small subsegmental PE in September during hospitalization for respiratory failure and aspiration PNA. Unclear if he needs to continue Eliquis long term. Would defer to oncology. Definitely need to hold for cystoscopy.    5. Chronic diastolic HF - well compensated.   6. Acute on CKD - follows with Renal. Recent admission with obstructive uropathy. Plan cystoscopy with urology  7. HLD - on statin therapy  8. DM - per Endocrine.     Signed: Shaneca Orne Martinique, MD  10/23/2020 10:23 AM

## 2020-10-23 ENCOUNTER — Encounter: Payer: Self-pay | Admitting: Cardiology

## 2020-10-23 ENCOUNTER — Other Ambulatory Visit: Payer: Self-pay

## 2020-10-23 ENCOUNTER — Ambulatory Visit (INDEPENDENT_AMBULATORY_CARE_PROVIDER_SITE_OTHER): Payer: Medicare Other | Admitting: Cardiology

## 2020-10-23 VITALS — BP 120/78 | HR 83 | Ht 69.0 in | Wt 143.2 lb

## 2020-10-23 DIAGNOSIS — I5032 Chronic diastolic (congestive) heart failure: Secondary | ICD-10-CM | POA: Diagnosis not present

## 2020-10-23 DIAGNOSIS — I25810 Atherosclerosis of coronary artery bypass graft(s) without angina pectoris: Secondary | ICD-10-CM

## 2020-10-23 DIAGNOSIS — I10 Essential (primary) hypertension: Secondary | ICD-10-CM

## 2020-10-23 DIAGNOSIS — N184 Chronic kidney disease, stage 4 (severe): Secondary | ICD-10-CM | POA: Diagnosis not present

## 2020-11-08 ENCOUNTER — Other Ambulatory Visit: Payer: Self-pay | Admitting: Endocrinology

## 2020-11-08 ENCOUNTER — Other Ambulatory Visit (INDEPENDENT_AMBULATORY_CARE_PROVIDER_SITE_OTHER): Payer: Medicare Other

## 2020-11-08 ENCOUNTER — Other Ambulatory Visit: Payer: Self-pay

## 2020-11-08 DIAGNOSIS — E1165 Type 2 diabetes mellitus with hyperglycemia: Secondary | ICD-10-CM

## 2020-11-08 DIAGNOSIS — E785 Hyperlipidemia, unspecified: Secondary | ICD-10-CM | POA: Diagnosis not present

## 2020-11-08 LAB — BASIC METABOLIC PANEL
BUN: 14 mg/dL (ref 6–23)
CO2: 27 mEq/L (ref 19–32)
Calcium: 8.5 mg/dL (ref 8.4–10.5)
Chloride: 107 mEq/L (ref 96–112)
Creatinine, Ser: 1.17 mg/dL (ref 0.40–1.50)
GFR: 65.49 mL/min (ref >60.00)
Glucose, Bld: 163 mg/dL — ABNORMAL HIGH (ref 70–99)
Potassium: 3.3 mEq/L — ABNORMAL LOW (ref 3.5–5.1)
Sodium: 142 mEq/L (ref 135–145)

## 2020-11-08 LAB — LDL CHOLESTEROL, DIRECT: Direct LDL: 61 mg/dL

## 2020-11-08 LAB — HEMOGLOBIN A1C: Hgb A1c MFr Bld: 5.9 % (ref 4.6–6.5)

## 2020-11-09 LAB — FRUCTOSAMINE: Fructosamine: 237 umol/L (ref 0–285)

## 2020-11-12 ENCOUNTER — Ambulatory Visit (INDEPENDENT_AMBULATORY_CARE_PROVIDER_SITE_OTHER): Payer: Medicare Other | Admitting: Endocrinology

## 2020-11-12 ENCOUNTER — Other Ambulatory Visit: Payer: Self-pay

## 2020-11-12 ENCOUNTER — Encounter: Payer: Self-pay | Admitting: Endocrinology

## 2020-11-12 VITALS — BP 132/62 | HR 83 | Ht 69.0 in | Wt 157.4 lb

## 2020-11-12 DIAGNOSIS — I25810 Atherosclerosis of coronary artery bypass graft(s) without angina pectoris: Secondary | ICD-10-CM | POA: Diagnosis not present

## 2020-11-12 DIAGNOSIS — E876 Hypokalemia: Secondary | ICD-10-CM

## 2020-11-12 DIAGNOSIS — E1165 Type 2 diabetes mellitus with hyperglycemia: Secondary | ICD-10-CM | POA: Diagnosis not present

## 2020-11-12 DIAGNOSIS — I10 Essential (primary) hypertension: Secondary | ICD-10-CM | POA: Diagnosis not present

## 2020-11-12 NOTE — Addendum Note (Signed)
Addended by: Elayne Snare on: 11/12/2020 12:23 PM   Modules accepted: Level of Service

## 2020-11-12 NOTE — Progress Notes (Signed)
Patient ID: [name], male   DOB: 09/08/1955, 65 y.o.   MRN: [id number]           Reason for Appointment: Follow-up for Type 2 Diabetes   History of Present Illness:          Date of diagnosis of type 2 diabetes mellitus:   2018      Background history:  He has never been told to have diabetes but review of his previous labs indicate that he had high blood sugar 179 in 2014 His highest sugar has been 234 in 10/2016 when his A1c was 8.8, not clear if he was symptomatic at that time  Recent history:   A1c is 8.5, was 9.7   Non-insulin hypoglycemic drugs the patient is taking are:   Prandin 1-2 mg 3 times daily  Current management, blood sugar patterns and problems identified:   He did bring his monitor for download  He has only checked his blood sugars on 3 separate days in the last 2 weeks  His blood sugars appear to be mildly increased after breakfast  This is despite him saying that he is trying to take Prandin consistently before meals  Today had a breakfast sandwich with egg and sausage and blood sugar about an hour later was 177  Also nonfasting lab glucose in the morning was 163  He apparently had lost a lot of weight early part of this year but appears to be gaining it all back now  Metformin previously stopped because of renal dysfunction  However fasting readings appear to be relatively better  No hypoglycemia reported            Glucose monitoring:  Very irregularly, has Accu-Chek guide meter  Blood sugars in the last 2 weeks show fasting of 92, 103 After breakfast 171, 177    Dietician visit, most recent: 2/21  Weight history:  Wt Readings from Last 3 Encounters:  11/12/20 157 lb 6.4 oz (71.4 kg)  10/23/20 143 lb 3.2 oz (65 kg)  10/11/20 132 lb 0.9 oz (59.9 kg)    Glycemic control:   Lab Results  Component Value Date   HGBA1C 5.9 11/08/2020   HGBA1C 8.5 (A) 07/25/2020   HGBA1C 9.7 (H) 05/03/2020   Lab Results  Component Value  Date   MICROALBUR 12.6 (H) 07/21/2018   LDLCALC 39 09/04/2019   CREATININE 1.17 11/08/2020   Lab Results  Component Value Date   MICRALBCREAT 10.0 07/21/2018    Lab Results  Component Value Date   FRUCTOSAMINE 237 11/08/2020   FRUCTOSAMINE 327 (H) 05/28/2020   FRUCTOSAMINE 345 (H) 04/05/2020   Lab Results  Component Value Date   HGB 8.1 (L) 10/10/2020    Lab on 11/08/2020  Component Date Value Ref Range Status  . Fructosamine 11/08/2020 237  0 - 285 umol/L Final   Comment: Published reference interval for apparently healthy subjects between age 5 and 62 is 48 - 285 umol/L and in a poorly controlled diabetic population is 228 - 563 umol/L with a mean of 396 umol/L.   [name] Sodium 11/08/2020 142  135 - 145 mEq/L Final  . Potassium 11/08/2020 3.3* 3.5 - 5.1 mEq/L Final  . Chloride 11/08/2020 107  96 - 112 mEq/L Final  . CO2 11/08/2020 27  19 - 32 mEq/L Final  . Glucose, Bld 11/08/2020 163* 70 - 99 mg/dL Final  . BUN 11/08/2020 14  6 - 23 mg/dL Final  . Creatinine, Ser 11/08/2020 1.17  0.40 -  1.50 mg/dL Final  . GFR 11/08/2020 65.49  >60.00 mL/min Final   Calculated using the CKD-EPI Creatinine Equation (2021)  . Calcium 11/08/2020 8.5  8.4 - 10.5 mg/dL Final  . Direct LDL 11/08/2020 61.0  mg/dL Final   Optimal:  <100 mg/dLNear or Above Optimal:  100-129 mg/dLBorderline High:  130-159 mg/dLHigh:  160-189 mg/dLVery High:  >190 mg/dL  . Hgb A1c MFr Bld 11/08/2020 5.9  4.6 - 6.5 % Final   Glycemic Control Guidelines for People with Diabetes:Non Diabetic:  <6%Goal of Therapy: <7%Additional Action Suggested:  >8%     Allergies as of 11/12/2020      Reactions   Metformin And Related Diarrhea      Medication List       Accurate as of Nov 12, 2020 10:10 AM. If you have any questions, ask your nurse or doctor.        Accu-Chek Guide Me w/Device Kit 1 each by Does not apply route.   Accu-Chek Guide test strip Generic drug: glucose blood Use Accu Chek test strips as  instructed to check blood sugar fasting or two hours after meals, every other day.   Accu-Chek Softclix Lancets lancets Use Accu Chek softclix lancets to check blood sugar fasting or 2 hours after a meal every other day.   acyclovir 400 MG tablet Commonly known as: ZOVIRAX Take 0.5 tablets (200 mg total) by mouth 2 (two) times daily.   amLODipine 5 MG tablet Commonly known as: NORVASC Take 5 mg by mouth daily.   apixaban 2.5 MG Tabs tablet Commonly known as: ELIQUIS Take 1 tablet (2.5 mg total) by mouth 2 (two) times daily.   aspirin EC 81 MG tablet Take 81 mg by mouth daily. Swallow whole.   atorvastatin 80 MG tablet Commonly known as: LIPITOR Take 1 tablet by mouth once daily   carvedilol 25 MG tablet Commonly known as: COREG Take 25 mg by mouth 2 (two) times daily with a meal.   dexamethasone 4 MG tablet Commonly known as: DECADRON Take 12 mg by mouth See admin instructions. Take as directed after chemo treatments   hydrALAZINE 50 MG tablet Commonly known as: APRESOLINE TAKE 1 TABLET BY MOUTH THREE TIMES DAILY   isosorbide mononitrate 30 MG 24 hr tablet Commonly known as: IMDUR Take 1 tablet (30 mg total) by mouth daily.   lactose free nutrition Liqd Take 237 mLs by mouth as needed (for supplementation).   potassium chloride SA 20 MEQ tablet Commonly known as: KLOR-CON Take 1 tablet by mouth once daily   prochlorperazine 10 MG tablet Commonly known as: COMPAZINE Take 1 tablet (10 mg total) by mouth every 6 (six) hours as needed (Nausea or vomiting). What changed: reasons to take this   repaglinide 1 MG tablet Commonly known as: PRANDIN TAKE 1 TABLET BY MOUTH THREE TIMES DAILY BEFORE MEAL(S) What changed: See the new instructions.   tamsulosin 0.4 MG Caps capsule Commonly known as: FLOMAX Take 1 capsule (0.4 mg total) by mouth daily.       Allergies:  Allergies  Allergen Reactions  . Metformin And Related Diarrhea    Past Medical History:   Diagnosis Date  . Anemia   . Arthritis    "left knee" (11/24/2017)  . CAD (coronary artery disease)    CABG 2002  . Cancer [facility])    Multiple Myeloma  . CHF (congestive heart failure) ([place])   . Chronic kidney disease   . Chronic renal insufficiency   . Diabetes mellitus  without complication ([facility])   . Dilated cardiomyopathy ([facility])    echo in 2009 showed improvement with a normal EF  . HTN (hypertension)   . Hyperlipemia   . Hypertension   . Noncompliance   . PAD (peripheral artery disease) ([facility])   . Pneumonia     Past Surgical History:  Procedure Laterality Date  . CARDIAC CATHETERIZATION  2002, 2006  . CORONARY ARTERY BYPASS GRAFT  2002   x4 DR. VAN TRIGT  . IR FLUORO GUIDED NEEDLE PLC ASPIRATION/INJECTION LOC  10/24/2019    Family History  Problem Relation Age of Onset  . Hypertension Father   . Diabetes Mother        DIABETIC COMA  . Healthy Daughter     Social History:  reports that he has never smoked. He has never used smokeless tobacco. He reports previous alcohol use. He reports previous drug use.   Review of Systems   Lipid history: On atorvastatin 80 mg from his cardiologist since diagnosis of CAD, labs as follows    Lab Results  Component Value Date   CHOL 92 (L) 09/04/2019   HDL 35 (L) 09/04/2019   LDLCALC 39 09/04/2019   LDLDIRECT 61.0 11/08/2020   TRIG 89 09/04/2019   CHOLHDL 2.6 09/04/2019           Hypertension: Has been followed by cardiologist for blood pressure management Recent readings:  BP Readings from Last 3 Encounters:  11/12/20 132/62  10/23/20 120/78  10/11/20 113/63   RENAL dysfunction is improved  Renal dysfunction not related to diabetes Not clear why he has persistent hypokalemia, he thinks he is taking his supplement  No history of proteinuria  Lab Results  Component Value Date   CREATININE 1.17 11/08/2020   CREATININE 1.35 (H) 10/11/2020   CREATININE 1.61 (H) 10/10/2020   Lab Results  Component Value Date    K 3.3 (L) 11/08/2020    Most recent eye exam was in 2018  Most recent foot exam: 07/2018   LABS:  Lab on 11/08/2020  Component Date Value Ref Range Status  . Fructosamine 11/08/2020 237  0 - 285 umol/L Final   Comment: Published reference interval for apparently healthy subjects between age 8 and 25 is 41 - 285 umol/L and in a poorly controlled diabetic population is 228 - 563 umol/L with a mean of 396 umol/L.   [name] Sodium 11/08/2020 142  135 - 145 mEq/L Final  . Potassium 11/08/2020 3.3* 3.5 - 5.1 mEq/L Final  . Chloride 11/08/2020 107  96 - 112 mEq/L Final  . CO2 11/08/2020 27  19 - 32 mEq/L Final  . Glucose, Bld 11/08/2020 163* 70 - 99 mg/dL Final  . BUN 61/05/6596 14  6 - 23 mg/dL Final  . Creatinine, Ser 11/08/2020 1.17  0.40 - 1.50 mg/dL Final  . GFR 85/94/0141 65.49  >60.00 mL/min Final   Calculated using the CKD-EPI Creatinine Equation (2021)  . Calcium 11/08/2020 8.5  8.4 - 10.5 mg/dL Final  . Direct LDL 15/08/1031 61.0  mg/dL Final   Optimal:  <051 mg/dLNear or Above Optimal:  100-129 mg/dLBorderline High:  130-159 mg/dLHigh:  160-189 mg/dLVery High:  >190 mg/dL  . Hgb A1c MFr Bld 11/08/2020 5.9  4.6 - 6.5 % Final   Glycemic Control Guidelines for People with Diabetes:Non Diabetic:  <6%Goal of Therapy: <7%Additional Action Suggested:  >8%     Physical Examination:  BP 132/62   Pulse 83   Ht 5\' 9"  (1.753 m)  Wt 157 lb 6.4 oz (71.4 kg)   SpO2 99%   BMI 23.24 kg/m          ASSESSMENT:  Diabetes type 2, mild  See history of present illness for detailed discussion of current diabetes management, blood sugar patterns and problems identified  A1c is much lower than usual at 5.9  He is on a regimen of only 1 mg Prandin before each meal Earlier this year he had lost a significant amount of weight and likely this caused his blood sugars to come down as well as not taking any steroid injections Still not monitoring blood sugars much around  Fasting blood  sugars appear to be fairly good but postprandial readings after breakfast are mildly increased around 170  Hypokalemia with hypertension: Rule out hyperaldosteronism, will order aldosterone/renin ratio  PLAN:    Will not restart metformin as yet unless fasting readings go up  Continue Repeglanide 1 mg before each meal Although he may benefit from taking 2 mg at breakfast and may be simpler to let him follow the same schedule for each meal for better compliance Discussed needing to check blood sugars at different times by rotation and he will have his wife help him to do this  We will increase his potassium to twice daily for 15 days and then continue 1 a day  There are no Patient Instructions on file for this visit.     Elayne Snare 11/12/2020, 10:10 AM   Note: This office note was prepared with Dragon voice recognition system technology. Any transcriptional errors that result from this process are unintentional.

## 2020-11-12 NOTE — Patient Instructions (Addendum)
Check blood sugars on waking up days 2-3 a week  Also check blood sugars about 2 hours after meals and do this after different meals by rotation  Recommended blood sugar levels on waking up are 90-130 and about 2 hours after meal is 130-180  Please bring your blood sugar monitor to each visit, thank you  Take 2 potassium daily for 15 days then 1 daily

## 2020-11-13 ENCOUNTER — Ambulatory Visit (INDEPENDENT_AMBULATORY_CARE_PROVIDER_SITE_OTHER): Payer: Medicare Other | Admitting: Internal Medicine

## 2020-11-13 ENCOUNTER — Encounter: Payer: Self-pay | Admitting: Internal Medicine

## 2020-11-13 VITALS — BP 140/80 | HR 78 | Temp 98.1°F | Wt 158.3 lb

## 2020-11-13 DIAGNOSIS — N179 Acute kidney failure, unspecified: Secondary | ICD-10-CM | POA: Diagnosis not present

## 2020-11-13 DIAGNOSIS — N3289 Other specified disorders of bladder: Secondary | ICD-10-CM | POA: Diagnosis not present

## 2020-11-13 DIAGNOSIS — C9001 Multiple myeloma in remission: Secondary | ICD-10-CM

## 2020-11-13 DIAGNOSIS — Z09 Encounter for follow-up examination after completed treatment for conditions other than malignant neoplasm: Secondary | ICD-10-CM

## 2020-11-13 DIAGNOSIS — I25810 Atherosclerosis of coronary artery bypass graft(s) without angina pectoris: Secondary | ICD-10-CM

## 2020-11-13 DIAGNOSIS — N1831 Chronic kidney disease, stage 3a: Secondary | ICD-10-CM

## 2020-11-13 DIAGNOSIS — A419 Sepsis, unspecified organism: Secondary | ICD-10-CM | POA: Diagnosis not present

## 2020-11-13 LAB — CBC WITH DIFFERENTIAL/PLATELET
Basophils Absolute: 0.1 10*3/uL (ref 0.0–0.1)
Basophils Relative: 1 % (ref 0.0–3.0)
Eosinophils Absolute: 0.2 10*3/uL (ref 0.0–0.7)
Eosinophils Relative: 2.4 % (ref 0.0–5.0)
HCT: 29.5 % — ABNORMAL LOW (ref 39.0–52.0)
Hemoglobin: 9.7 g/dL — ABNORMAL LOW (ref 13.0–17.0)
Lymphocytes Relative: 14.1 % (ref 12.0–46.0)
Lymphs Abs: 1.1 10*3/uL (ref 0.7–4.0)
MCHC: 32.8 g/dL (ref 30.0–36.0)
MCV: 86.1 fl (ref 78.0–100.0)
Monocytes Absolute: 0.6 10*3/uL (ref 0.1–1.0)
Monocytes Relative: 7.5 % (ref 3.0–12.0)
Neutro Abs: 5.8 10*3/uL (ref 1.4–7.7)
Neutrophils Relative %: 75 % (ref 43.0–77.0)
Platelets: 346 10*3/uL (ref 150.0–400.0)
RBC: 3.42 Mil/uL — ABNORMAL LOW (ref 4.22–5.81)
RDW: 17.7 % — ABNORMAL HIGH (ref 11.5–15.5)
WBC: 7.7 10*3/uL (ref 4.0–10.5)

## 2020-11-13 LAB — COMPREHENSIVE METABOLIC PANEL
ALT: 6 U/L (ref 0–53)
AST: 11 U/L (ref 0–37)
Albumin: 3.5 g/dL (ref 3.5–5.2)
Alkaline Phosphatase: 90 U/L (ref 39–117)
BUN: 16 mg/dL (ref 6–23)
CO2: 29 mEq/L (ref 19–32)
Calcium: 8.6 mg/dL (ref 8.4–10.5)
Chloride: 106 mEq/L (ref 96–112)
Creatinine, Ser: 1.31 mg/dL (ref 0.40–1.50)
GFR: 57.17 mL/min — ABNORMAL LOW (ref >60.00)
Glucose, Bld: 128 mg/dL — ABNORMAL HIGH (ref 70–99)
Potassium: 3.6 mEq/L (ref 3.5–5.1)
Sodium: 145 mEq/L (ref 135–145)
Total Bilirubin: 0.5 mg/dL (ref 0.2–1.2)
Total Protein: 5.7 g/dL — ABNORMAL LOW (ref 6.0–8.3)

## 2020-11-13 NOTE — Progress Notes (Signed)
Established Patient Office Visit     This visit occurred during the SARS-CoV-2 public health emergency.  Safety protocols were in place, including screening questions prior to the visit, additional usage of staff PPE, and extensive cleaning of exam room while observing appropriate contact time as indicated for disinfecting solutions.    CC/Reason for Visit: Hospital discharge follow-up  HPI: [name] is a 65 y.o. male who is coming in today for the above mentioned reasons. Past Medical History is significant for: He has a complex past medical history that is significant for coronary artery disease status post CABG in 2002 with dilated cardiomyopathy.  He has a history of hypertension, hyperlipidemia and type 2 diabetes.  He is followed by Dr. [name], endocrinology.  He is currently only on Prandin.  He has stage III chronic kidney disease.  He was diagnosed with multiple myeloma last year and is currently undergoing treatment with oncology.  He was hospitalized back in September 2021 with sepsis due to aspiration pneumonia.  During that hospitalization he was also found to have a pulmonary embolism and has been on Eliquis ever since.  He was hospitalized from March 28 through April 1 due to urosepsis secondary to bilateral hydroureteronephrosis from what appears to be a bladder mass.  His creatinine improved with Foley catheter placement.  He received IV fluids and antibiotic therapy.  From the hospital he was sent to rehab.  He has his first post hospital appointment with urology scheduled for tomorrow.  He still has Foley catheter in place. He still feels very weak but other than this has no acute complaints.   Past Medical/Surgical History: Past Medical History:  Diagnosis Date  . Anemia   . Arthritis    "left knee" (11/24/2017)  . CAD (coronary artery disease)    CABG 2002  . Cancer [facility])    Multiple Myeloma  . CHF (congestive heart failure) ([place])   . Chronic kidney  disease   . Chronic renal insufficiency   . Diabetes mellitus without complication ([place])   . Dilated cardiomyopathy ([place])    echo in 2009 showed improvement with a normal EF  . HTN (hypertension)   . Hyperlipemia   . Hypertension   . Noncompliance   . PAD (peripheral artery disease) ([place])   . Pneumonia     Past Surgical History:  Procedure Laterality Date  . CARDIAC CATHETERIZATION  2002, 2006  . CORONARY ARTERY BYPASS GRAFT  2002   x4 DR. VAN TRIGT  . IR FLUORO GUIDED NEEDLE PLC ASPIRATION/INJECTION LOC  10/24/2019    Social History:  reports that he has never smoked. He has never used smokeless tobacco. He reports previous alcohol use. He reports previous drug use.  Allergies: Allergies  Allergen Reactions  . Metformin And Related Diarrhea    Family History:  Family History  Problem Relation Age of Onset  . Hypertension Father   . Diabetes Mother        DIABETIC COMA  . Healthy Daughter      Current Outpatient Medications:  .  Accu-Chek Softclix Lancets lancets, Use Accu Chek softclix lancets to check blood sugar fasting or 2 hours after a meal every other day., Disp: 100 each, Rfl: 2 .  acyclovir (ZOVIRAX) 400 MG tablet, Take 0.5 tablets (200 mg total) by mouth 2 (two) times daily., Disp: 60 tablet, Rfl: 11 .  amLODipine (NORVASC) 5 MG tablet, Take 5 mg by mouth daily., Disp: , Rfl:  .  apixaban (  ELIQUIS) 2.5 MG TABS tablet, Take 1 tablet (2.5 mg total) by mouth 2 (two) times daily., Disp: 60 tablet, Rfl: 2 .  aspirin EC 81 MG tablet, Take 81 mg by mouth daily. Swallow whole., Disp: , Rfl:  .  atorvastatin (LIPITOR) 80 MG tablet, Take 1 tablet by mouth once daily (Patient taking differently: Take 80 mg by mouth daily.), Disp: 90 tablet, Rfl: 3 .  Blood Glucose Monitoring Suppl (ACCU-CHEK GUIDE ME) w/Device KIT, 1 each by Does not apply route., Disp: , Rfl:  .  carvedilol (COREG) 25 MG tablet, Take 25 mg by mouth 2 (two) times daily with a meal., Disp: , Rfl:  .   glucose blood (ACCU-CHEK GUIDE) test strip, Use Accu Chek test strips as instructed to check blood sugar fasting or two hours after meals, every other day., Disp: 100 each, Rfl: 2 .  hydrALAZINE (APRESOLINE) 50 MG tablet, TAKE 1 TABLET BY MOUTH THREE TIMES DAILY (Patient taking differently: Take 50 mg by mouth 3 (three) times daily.), Disp: 270 tablet, Rfl: 3 .  isosorbide mononitrate (IMDUR) 30 MG 24 hr tablet, Take 1 tablet (30 mg total) by mouth daily., Disp: 90 tablet, Rfl: 2 .  lactose free nutrition (BOOST) LIQD, Take 237 mLs by mouth as needed (for supplementation)., Disp: , Rfl:  .  potassium chloride SA (KLOR-CON) 20 MEQ tablet, Take 1 tablet by mouth once daily, Disp: 30 tablet, Rfl: 3 .  prochlorperazine (COMPAZINE) 10 MG tablet, Take 1 tablet (10 mg total) by mouth every 6 (six) hours as needed (Nausea or vomiting). (Patient taking differently: Take 10 mg by mouth every 6 (six) hours as needed for nausea or vomiting.), Disp: 30 tablet, Rfl: 1 .  repaglinide (PRANDIN) 1 MG tablet, TAKE 1 TABLET BY MOUTH THREE TIMES DAILY BEFORE MEAL(S) (Patient taking differently: Take 1 mg by mouth 3 (three) times daily before meals.), Disp: 90 tablet, Rfl: 2 .  tamsulosin (FLOMAX) 0.4 MG CAPS capsule, Take 1 capsule (0.4 mg total) by mouth daily., Disp: , Rfl:  No current facility-administered medications for this visit.  Facility-Administered Medications Ordered in Other Visits:  .  sodium chloride flush (NS) 0.9 % injection 10 mL, 10 mL, Intravenous, PRN, Irene Limbo, Cloria Spring, MD .  sodium chloride flush (NS) 0.9 % injection 10 mL, 10 mL, Intracatheter, Once PRN, Brunetta Genera, MD  Review of Systems:  Constitutional: Denies fever, chills, diaphoresis, appetite change. HEENT: Denies photophobia, eye pain, redness, hearing loss, ear pain, congestion, sore throat, rhinorrhea, sneezing, mouth sores, trouble swallowing, neck pain, neck stiffness and tinnitus.   Respiratory: Denies SOB, DOE, cough,  chest tightness,  and wheezing.   Cardiovascular: Denies chest pain, palpitations and leg swelling.  Gastrointestinal: Denies nausea, vomiting, abdominal pain, diarrhea, constipation, blood in stool and abdominal distention.  Genitourinary: Denies dysuria, urgency, frequency, hematuria, flank pain and difficulty urinating.  Endocrine: Denies: hot or cold intolerance, sweats, changes in hair or nails, polyuria, polydipsia. Musculoskeletal: Denies myalgias, back pain, joint swelling, arthralgias and gait problem.  Skin: Denies pallor, rash and wound.  Neurological: Denies dizziness, seizures, syncope, light-headedness, numbness and headaches.  Hematological: Denies adenopathy. Easy bruising, personal or family bleeding history  Psychiatric/Behavioral: Denies suicidal ideation, mood changes, confusion, nervousness, sleep disturbance and agitation    Physical Exam: Vitals:   11/13/20 1342  BP: 140/80  Pulse: 78  Temp: 98.1 F (36.7 C)  TempSrc: Oral  SpO2: 99%  Weight: 158 lb 4.8 oz (71.8 kg)    Body mass index is  23.38 kg/m.   Constitutional: NAD, calm, comfortable, ambulates with a cane, unsteady gait. Eyes: PERRL, lids and conjunctivae normal, wears corrective lenses. ENMT: Mucous membranes are moist.  Respiratory: clear to auscultation bilaterally, no wheezing, no crackles. Normal respiratory effort. No accessory muscle use.  Cardiovascular: Regular rate and rhythm, no murmurs / rubs / gallops. No extremity edema.  Psychiatric: Normal judgment and insight. Alert and oriented x 3. Normal mood.    Impression and Plan:  Hospital discharge follow-up - Plan: Comprehensive metabolic panel, CBC with Differential/Platelet  Bladder mass - Plan: CBC with Differential/Platelet  Sepsis without acute organ dysfunction, due to unspecified organism ([place])  AKI (acute kidney injury) ([place])  Chronic renal impairment, stage 3a ([place])  Multiple myeloma in remission [facility])  -Hospital  charts have been reviewed in detail. -He has completed antibiotic therapy. -He still has Foley catheter in place and has his first post hospital follow-up with urology scheduled for tomorrow at 2 PM. -I will order labs today as requested by discharging hospitalist. -Advised to follow-up in 3 months for his annual physical.  Time spent: 32 minutes reviewing hospital charts, examining patient, performing medication reconciliation and formulating plan of care.      Lelon Frohlich, MD [facility] at [facility] with a cane, unsteady gait

## 2020-11-18 ENCOUNTER — Other Ambulatory Visit (HOSPITAL_COMMUNITY): Payer: Self-pay | Admitting: Urology

## 2020-11-18 DIAGNOSIS — D214 Benign neoplasm of connective and other soft tissue of abdomen: Secondary | ICD-10-CM

## 2020-11-26 ENCOUNTER — Encounter (HOSPITAL_COMMUNITY): Payer: Self-pay

## 2020-11-26 ENCOUNTER — Ambulatory Visit (HOSPITAL_COMMUNITY)
Admission: RE | Admit: 2020-11-26 | Discharge: 2020-11-26 | Disposition: A | Discharge status: Home / Self Care | Payer: Medicare Other | Source: Ambulatory Visit | Attending: Urology | Admitting: Urology

## 2020-11-26 ENCOUNTER — Other Ambulatory Visit: Payer: Self-pay

## 2020-11-26 DIAGNOSIS — D214 Benign neoplasm of connective and other soft tissue of abdomen: Secondary | ICD-10-CM | POA: Diagnosis present

## 2020-11-26 MED ORDER — GADOBUTROL 1 MMOL/ML IV SOLN
7.5000 mL | Freq: Once | INTRAVENOUS | Status: AC | PRN
  Administered 2020-11-26: 7.5 mL via INTRAVENOUS

## 2020-12-11 ENCOUNTER — Encounter: Payer: Self-pay | Admitting: Hematology

## 2020-12-19 ENCOUNTER — Encounter: Payer: Self-pay | Admitting: Hematology

## 2020-12-20 ENCOUNTER — Encounter: Payer: Self-pay | Admitting: Hematology

## 2020-12-23 ENCOUNTER — Other Ambulatory Visit: Payer: Self-pay | Admitting: Urology

## 2020-12-26 ENCOUNTER — Encounter: Payer: Self-pay | Admitting: Hematology

## 2020-12-27 ENCOUNTER — Telehealth: Payer: Self-pay | Admitting: Cardiology

## 2020-12-27 ENCOUNTER — Telehealth: Payer: Self-pay

## 2020-12-27 MED ORDER — AMLODIPINE BESYLATE 5 MG PO TABS: 5.0000 mg | ORAL_TABLET | Freq: Every day | ORAL | 3 refills | Status: DC

## 2020-12-27 NOTE — Telephone Encounter (Signed)
Spoke with pt, he reports taking amlodipine 5 mg once daily for a long time. It was given to him by lori gerhardt np. Patient made aware to make sure to bring medications to follow up appointments so his medications are up to date. Patient voiced understanding. Refill sent to the pharmacy electronically.

## 2020-12-27 NOTE — Telephone Encounter (Signed)
Patient left a message on refill voice mail stating he needs a refill on Norvasc and that he has switched to from Truitt Merle to Peter Martinique since Darrington has retired.   I reviewed patients chart and did not see the medication on his list.   Left patient a message advising him to contact the office with correct medication or with the medication he needed.

## 2020-12-27 NOTE — Telephone Encounter (Signed)
   Pt c/o medication issue:  1. Name of Medication: amlodipine  2. How are you currently taking this medication (dosage and times per day)? Not currently  3. Are you having a reaction (difficulty breathing--STAT)? No  4. What is your medication issue? Pt is calling to get a medication refill.The patient does not remember who prescribed the medication

## 2020-12-29 NOTE — Telephone Encounter (Signed)
Per our visit in April he was on norvasc 5 mg daily. Please renew with refills.  Jaylend Reiland Martinique MD, [facility]

## 2020-12-30 NOTE — Telephone Encounter (Signed)
Norvasc 5 mg refill already sent to pharmacy 12/27/20.

## 2020-12-31 NOTE — Patient Instructions (Addendum)
DUE TO COVID-19 ONLY ONE VISITOR IS ALLOWED TO COME WITH YOU AND STAY IN THE WAITING ROOM ONLY DURING PRE OP AND PROCEDURE DAY OF SURGERY. THE 2 VISITORS  MAY VISIT WITH YOU AFTER SURGERY IN YOUR PRIVATE ROOM DURING VISITING HOURS ONLY!  YOU NEED TO HAVE A COVID 19 TEST ON__6/23_____ @_2 :35pm______, THIS TEST MUST BE DONE BEFORE SURGERY,  COVID TESTING SITE [place] [address], IT IS ON THE RIGHT GOING OUT [address] APPROXIMATELY  2 MINUTES PAST [employer] ON THE RIGHT. ONCE YOUR COVID TEST IS COMPLETED,  PLEASE BEGIN THE QUARANTINE INSTRUCTIONS AS OUTLINED IN YOUR HANDOUT.                [name]     Your procedure is scheduled on: 01/06/21   Report to [facility] Main  Entrance   Report to admitting at  7:00 AM     Call this number if you have problems the morning of surgery [phone]    Remember: Do not eat food after Midnight.  You may have clear liquids until 6:00 am    BRUSH YOUR TEETH MORNING OF SURGERY AND RINSE YOUR MOUTH OUT, NO CHEWING GUM CANDY OR MINTS.     Take these medicines the morning of surgery with A SIP OF WATER: Carvedilol, Amlodipine  DO NOT TAKE ANY DIABETIC MEDICATIONS DAY OF YOUR SURGERY                               You may not have any metal on your body including              piercings  Do not wear jewelry,  lotions, powders or deodorant                     Men may shave face and neck.   Do not bring valuables to the hospital. [place].  Contacts, dentures or bridgework may not be worn into surgery.                 Please read over the following fact sheets you were given: _____________________________________________________________________             [facility] - Preparing for Surgery Before surgery, you can play an important role.  Because skin is not sterile, your skin needs to be as free of germs as possible.  You can reduce the  number of germs on your skin by washing with CHG (chlorahexidine gluconate) soap before surgery.  CHG is an antiseptic cleaner which kills germs and bonds with the skin to continue killing germs even after washing. Please DO NOT use if you have an allergy to CHG or antibacterial soaps.  If your skin becomes reddened/irritated stop using the CHG and inform your nurse when you arrive at Short Stay.   You may shave your face/neck.  Please follow these instructions carefully:  1.  Shower with CHG Soap the night before surgery and the  morning of Surgery.  2.  If you choose to wash your hair, wash your hair first as usual with your  normal  shampoo.  3.  After you shampoo, rinse your hair and body thoroughly to remove the  shampoo.  4.  Use CHG as you would any other liquid soap.  You can apply chg directly  to the skin and wash                       Gently with a scrungie or clean washcloth.  5.  Apply the CHG Soap to your body ONLY FROM THE NECK DOWN.   Do not use on face/ open                           Wound or open sores. Avoid contact with eyes, ears mouth and genitals (private parts).                       Wash face,  Genitals (private parts) with your normal soap.             6.  Wash thoroughly, paying special attention to the area where your surgery  will be performed.  7.  Thoroughly rinse your body with warm water from the neck down.  8.  DO NOT shower/wash with your normal soap after using and rinsing off  the CHG Soap.             9.  Pat yourself dry with a clean towel.            10.  Wear clean pajamas.            11.  Place clean sheets on your bed the night of your first shower and do not  sleep with pets. Day of Surgery : Do not apply any lotions/deodorants the morning of surgery.  Please wear clean clothes to the hospital/surgery center.  FAILURE TO FOLLOW THESE INSTRUCTIONS MAY RESULT IN THE CANCELLATION OF YOUR SURGERY PATIENT  SIGNATURE_________________________________  NURSE SIGNATURE__________________________________  ________________________________________________________________________

## 2021-01-02 ENCOUNTER — Encounter (HOSPITAL_COMMUNITY)
Admission: RE | Admit: 2021-01-02 | Discharge: 2021-01-02 | Disposition: A | Discharge status: Home / Self Care | Payer: Medicare Other | Source: Ambulatory Visit | Attending: Urology | Admitting: Urology

## 2021-01-02 ENCOUNTER — Other Ambulatory Visit (HOSPITAL_COMMUNITY)
Admission: RE | Admit: 2021-01-02 | Discharge: 2021-01-02 | Disposition: A | Discharge status: Home / Self Care | Payer: Medicare Other | Source: Ambulatory Visit | Attending: Urology | Admitting: Urology

## 2021-01-02 ENCOUNTER — Encounter (HOSPITAL_COMMUNITY): Payer: Self-pay

## 2021-01-02 ENCOUNTER — Other Ambulatory Visit: Payer: Self-pay

## 2021-01-02 DIAGNOSIS — Z01812 Encounter for preprocedural laboratory examination: Secondary | ICD-10-CM | POA: Insufficient documentation

## 2021-01-02 DIAGNOSIS — Z20822 Contact with and (suspected) exposure to covid-19: Secondary | ICD-10-CM | POA: Insufficient documentation

## 2021-01-02 LAB — CBC
HCT: 36.2 % — ABNORMAL LOW (ref 39.0–52.0)
Hemoglobin: 11.2 g/dL — ABNORMAL LOW (ref 13.0–17.0)
MCH: 27.9 pg (ref 26.0–34.0)
MCHC: 30.9 g/dL (ref 30.0–36.0)
MCV: 90 fL (ref 80.0–100.0)
Platelets: 284 10*3/uL (ref 150–400)
RBC: 4.02 MIL/uL — ABNORMAL LOW (ref 4.22–5.81)
RDW: 14.7 % (ref 11.5–15.5)
WBC: 7.8 10*3/uL (ref 4.0–10.5)
nRBC: 0 % (ref 0.0–0.2)

## 2021-01-02 LAB — PROTIME-INR
INR: 0.9 (ref 0.8–1.2)
Prothrombin Time: 12.5 seconds (ref 11.4–15.2)

## 2021-01-02 LAB — BASIC METABOLIC PANEL
Anion gap: 8 (ref 5–15)
BUN: 23 mg/dL (ref 8–23)
CO2: 25 mmol/L (ref 22–32)
Calcium: 8.9 mg/dL (ref 8.9–10.3)
Chloride: 105 mmol/L (ref 98–111)
Creatinine, Ser: 1.66 mg/dL — ABNORMAL HIGH (ref 0.61–1.24)
GFR, Estimated: 45 mL/min — ABNORMAL LOW (ref >60)
Glucose, Bld: 131 mg/dL — ABNORMAL HIGH (ref 70–99)
Potassium: 3.9 mmol/L (ref 3.5–5.1)
Sodium: 138 mmol/L (ref 135–145)

## 2021-01-02 LAB — HEMOGLOBIN A1C
Hgb A1c MFr Bld: 6.6 % — ABNORMAL HIGH (ref 4.8–5.6)
Mean Plasma Glucose: 142.72 mg/dL

## 2021-01-02 LAB — GLUCOSE, CAPILLARY: Glucose-Capillary: 182 mg/dL — ABNORMAL HIGH (ref 70–99)

## 2021-01-02 LAB — SARS CORONAVIRUS 2 (TAT 6-24 HRS): SARS Coronavirus 2: NEGATIVE

## 2021-01-02 NOTE — Progress Notes (Signed)
COVID Vaccine Completed:yes Date COVID Vaccine completed:pt reports that he had 3 shots COVID vaccine manufacturer: Pfizer     PCP - Dr. Rockney Ghee  LOV 11/13/20 Cardiologist - Dr. P. Martinique LOV 10/23/20  Chest x-ray - no EKG - 09/17/20-epic Stress Test - no ECHO - no Cardiac Cath - 2002,2006, CABG x4 2002 Pacemaker/ICD device last checked:NA  Sleep Study - no CPAP -   Fasting Blood Sugar - 100-102 Checks Blood Sugar __QD___ times a day  Blood Thinner Instructions:ASA 81/ Dr. Martinique Aspirin Instructions:stop 3 days prior to DOS/ Dr. Gloriann Loan Last Dose 01/02/21  Anesthesia review: cardiac history  Patient denies shortness of breath, fever, cough and chest pain at PAT appointment Yes Pt's gait is very slow he reports no SOB climbing stairs, doing work around the house or with ADLs  Patient verbalized understanding of instructions that were given to them at the PAT appointment. Patient was also instructed that they will need to review over the PAT instructions again at home before surgery. Yes

## 2021-01-03 NOTE — Progress Notes (Signed)
Anesthesia Chart Review   Case: [id number] Date/Time: 01/06/21 0845   Procedure: TRANSURETHRAL RESECTION OF THE PROSTATE (TURP) POSSIBLE TRANSURETHRAL RESECTION OF BLADDER TUMOR - REQUESTING 90 MINS   Anesthesia type: General   Pre-op diagnosis: BENIGN PROSTATE HYPERPLASIA   Location: [facility] PROCEDURE ROOM / [facility]   Surgeons: Crista Elliot, MD       DISCUSSION:64 y.o. never smoker with h/o HTN, DM II, CKD, CAD (CABG 2002), CHF, multiple myeloma in remission, BPH scheduled for above procedure 01/06/2021 with Dr. Modena Slater.   Pt last seen by cardiology 10/23/2020. Per OV note pt stable.  VS: BP (!) 171/69   Pulse 80   Temp 36.9 C (Oral)   Resp 20   Ht 5' 9.5" (1.765 m)   Wt 77.1 kg   SpO2 100%   BMI 24.74 kg/m   PROVIDERS: [name], [name], MD is PCP   [place], Peter, MD is Cardiologist  LABS: Labs reviewed: Acceptable for surgery. (all labs ordered are listed, but only abnormal results are displayed)  Labs Reviewed  HEMOGLOBIN A1C - Abnormal; Notable for the following components:      Result Value   Hgb A1c MFr Bld 6.6 (*)    All other components within normal limits  CBC - Abnormal; Notable for the following components:   RBC 4.02 (*)    Hemoglobin 11.2 (*)    HCT 36.2 (*)    All other components within normal limits  BASIC METABOLIC PANEL - Abnormal; Notable for the following components:   Glucose, Bld 131 (*)    Creatinine, Ser 1.66 (*)    GFR, Estimated 45 (*)    All other components within normal limits  GLUCOSE, CAPILLARY - Abnormal; Notable for the following components:   Glucose-Capillary 182 (*)    All other components within normal limits  PROTIME-INR  TYPE AND SCREEN     IMAGES:   EKG: 10/07/2020 Rate 105 bpm  Sinus tachycardia Multiform ventricular premature complexes Probable left ventricular hypertrophy Anterior Q waves, possibly due to LVH Nonspecific T abnormalities, lateral leads  CV: Echo 03/14/2020  1. Left ventricular  ejection fraction, by estimation, is 55 to 60%. The  left ventricle has normal function. The left ventricle has no regional  wall motion abnormalities. Left ventricular diastolic parameters were  normal.   2. Right ventricular systolic function is normal. The right ventricular  size is normal.   3. Left atrial size was mildly dilated.   4. The mitral valve is normal in structure. Trivial mitral valve  regurgitation. No evidence of mitral stenosis.   5. The aortic valve is normal in structure. Aortic valve regurgitation is  trivial. No aortic stenosis is present.   6. The inferior vena cava is normal in size with greater than 50%  respiratory variability, suggesting right atrial pressure of 3 mmHg.  Past Medical History:  Diagnosis Date   Anemia    Arthritis    "left knee" (11/24/2017)   CAD (coronary artery disease)    CABG 2002   Cancer [facility])    Multiple Myeloma   CHF (congestive heart failure) ([facility]) 2021   Chronic kidney disease    Chronic renal insufficiency    Diabetes mellitus without complication ([facility])    Dilated cardiomyopathy ([facility])    echo in 2009 showed improvement with a normal EF   HTN (hypertension)    Hyperlipemia    Hypertension    Noncompliance    PAD (peripheral artery disease) ([facility])  Pneumonia     Past Surgical History:  Procedure Laterality Date   CARDIAC CATHETERIZATION  2002, 2006   CORONARY ARTERY BYPASS GRAFT  2002   x4 DR. VAN TRIGT   IR FLUORO GUIDED NEEDLE PLC ASPIRATION/INJECTION LOC  10/24/2019    MEDICATIONS:  Accu-Chek Softclix Lancets lancets   acyclovir (ZOVIRAX) 400 MG tablet   amLODipine (NORVASC) 5 MG tablet   apixaban (ELIQUIS) 2.5 MG TABS tablet   aspirin EC 81 MG tablet   atorvastatin (LIPITOR) 80 MG tablet   Blood Glucose Monitoring Suppl (ACCU-CHEK GUIDE ME) w/Device KIT   carvedilol (COREG) 25 MG tablet   glucose blood (ACCU-CHEK GUIDE) test strip   hydrALAZINE (APRESOLINE) 50 MG tablet   isosorbide mononitrate (IMDUR) 30  MG 24 hr tablet   potassium chloride SA (KLOR-CON) 20 MEQ tablet   prochlorperazine (COMPAZINE) 10 MG tablet   repaglinide (PRANDIN) 1 MG tablet   tamsulosin (FLOMAX) 0.4 MG CAPS capsule   No current facility-administered medications for this encounter.    sodium chloride flush (NS) 0.9 % injection 10 mL   sodium chloride flush (NS) 0.9 % injection 10 mL    Konrad Felix, PA-C [facility] Pre-Surgical Testing [phone]

## 2021-01-06 ENCOUNTER — Encounter (HOSPITAL_COMMUNITY): Payer: Self-pay | Admitting: Urology

## 2021-01-06 ENCOUNTER — Ambulatory Visit (HOSPITAL_COMMUNITY): Payer: Medicare Other | Admitting: Physician Assistant

## 2021-01-06 ENCOUNTER — Other Ambulatory Visit: Payer: Self-pay

## 2021-01-06 ENCOUNTER — Ambulatory Visit (HOSPITAL_COMMUNITY): Payer: Medicare Other

## 2021-01-06 ENCOUNTER — Observation Stay (HOSPITAL_COMMUNITY)
Admission: RE | Admit: 2021-01-06 | Discharge: 2021-01-07 | Disposition: A | Discharge status: Home / Self Care | Payer: Medicare Other | Attending: Urology | Admitting: Urology

## 2021-01-06 ENCOUNTER — Ambulatory Visit (HOSPITAL_COMMUNITY): Payer: Medicare Other | Admitting: Anesthesiology

## 2021-01-06 ENCOUNTER — Encounter (HOSPITAL_COMMUNITY)
Admission: RE | Disposition: A | Discharge status: Home / Self Care | Payer: Self-pay | Source: Home / Self Care | Attending: Urology

## 2021-01-06 DIAGNOSIS — N323 Diverticulum of bladder: Secondary | ICD-10-CM | POA: Diagnosis not present

## 2021-01-06 DIAGNOSIS — R338 Other retention of urine: Secondary | ICD-10-CM | POA: Diagnosis not present

## 2021-01-06 DIAGNOSIS — Z7901 Long term (current) use of anticoagulants: Secondary | ICD-10-CM | POA: Diagnosis not present

## 2021-01-06 DIAGNOSIS — I119 Hypertensive heart disease without heart failure: Secondary | ICD-10-CM | POA: Diagnosis not present

## 2021-01-06 DIAGNOSIS — Z7982 Long term (current) use of aspirin: Secondary | ICD-10-CM | POA: Diagnosis not present

## 2021-01-06 DIAGNOSIS — N401 Enlarged prostate with lower urinary tract symptoms: Secondary | ICD-10-CM | POA: Diagnosis present

## 2021-01-06 DIAGNOSIS — Z9079 Acquired absence of other genital organ(s): Secondary | ICD-10-CM

## 2021-01-06 DIAGNOSIS — Z79899 Other long term (current) drug therapy: Secondary | ICD-10-CM | POA: Diagnosis not present

## 2021-01-06 DIAGNOSIS — C61 Malignant neoplasm of prostate: Principal | ICD-10-CM | POA: Insufficient documentation

## 2021-01-06 HISTORY — PX: TRANSURETHRAL RESECTION OF PROSTATE: SHX73

## 2021-01-06 LAB — TYPE AND SCREEN
ABO/RH(D): O POS
Antibody Screen: POSITIVE
Unit division: 0
Unit division: 0

## 2021-01-06 LAB — BPAM RBC
Blood Product Expiration Date: 202207302359
Blood Product Expiration Date: 202208032359
Unit Type and Rh: 9500
Unit Type and Rh: 9500

## 2021-01-06 LAB — GLUCOSE, CAPILLARY
Glucose-Capillary: 176 mg/dL — ABNORMAL HIGH (ref 70–99)
Glucose-Capillary: 123 mg/dL — ABNORMAL HIGH (ref 70–99)
Glucose-Capillary: 193 mg/dL — ABNORMAL HIGH (ref 70–99)
Glucose-Capillary: 243 mg/dL — ABNORMAL HIGH (ref 70–99)

## 2021-01-06 SURGERY — TURP (TRANSURETHRAL RESECTION OF PROSTATE)
Anesthesia: General | Site: Bladder

## 2021-01-06 MED ORDER — DEXAMETHASONE SODIUM PHOSPHATE 10 MG/ML IJ SOLN
INTRAMUSCULAR | Status: DC | PRN
  Administered 2021-01-06: 5 mg via INTRAVENOUS

## 2021-01-06 MED ORDER — CEFAZOLIN SODIUM-DEXTROSE 2-4 GM/100ML-% IV SOLN
2.0000 g | Freq: Three times a day (TID) | INTRAVENOUS | Status: AC
  Administered 2021-01-06 – 2021-01-07 (×3): 2 g via INTRAVENOUS
  Filled 2021-01-06 (×3): qty 100

## 2021-01-06 MED ORDER — KETOROLAC TROMETHAMINE 15 MG/ML IJ SOLN: 15.0000 mg | Freq: Once | INTRAMUSCULAR | Status: DC | PRN

## 2021-01-06 MED ORDER — OXYBUTYNIN CHLORIDE 5 MG PO TABS: 5.0000 mg | ORAL_TABLET | Freq: Three times a day (TID) | ORAL | Status: DC | PRN

## 2021-01-06 MED ORDER — DEXAMETHASONE SODIUM PHOSPHATE 10 MG/ML IJ SOLN
INTRAMUSCULAR | Status: AC
  Filled 2021-01-06: qty 1

## 2021-01-06 MED ORDER — LIDOCAINE 2% (20 MG/ML) 5 ML SYRINGE
INTRAMUSCULAR | Status: DC | PRN
  Administered 2021-01-06: 60 mg via INTRAVENOUS

## 2021-01-06 MED ORDER — DIATRIZOATE MEGLUMINE 30 % UR SOLN
URETHRAL | Status: AC
  Filled 2021-01-06: qty 100

## 2021-01-06 MED ORDER — ACETAMINOPHEN 325 MG PO TABS
650.0000 mg | ORAL_TABLET | Freq: Four times a day (QID) | ORAL | Status: DC
  Administered 2021-01-06 – 2021-01-07 (×3): 650 mg via ORAL
  Filled 2021-01-06 (×3): qty 2

## 2021-01-06 MED ORDER — LACTATED RINGERS IV SOLN: INTRAVENOUS | Status: DC

## 2021-01-06 MED ORDER — AMISULPRIDE (ANTIEMETIC) 5 MG/2ML IV SOLN: 5.0000 mg | Freq: Once | INTRAVENOUS | Status: DC | PRN

## 2021-01-06 MED ORDER — CHLORHEXIDINE GLUCONATE 0.12 % MT SOLN: 15.0000 mL | Freq: Once | OROMUCOSAL | Status: DC

## 2021-01-06 MED ORDER — HYDRALAZINE HCL 50 MG PO TABS
50.0000 mg | ORAL_TABLET | Freq: Three times a day (TID) | ORAL | Status: DC
  Administered 2021-01-06 – 2021-01-07 (×3): 50 mg via ORAL
  Filled 2021-01-06 (×3): qty 1

## 2021-01-06 MED ORDER — OXYCODONE HCL 5 MG PO TABS: 5.0000 mg | ORAL_TABLET | ORAL | Status: DC | PRN

## 2021-01-06 MED ORDER — ATORVASTATIN CALCIUM 40 MG PO TABS
80.0000 mg | ORAL_TABLET | Freq: Every day | ORAL | Status: DC
  Administered 2021-01-06 – 2021-01-07 (×2): 80 mg via ORAL
  Filled 2021-01-06 (×2): qty 2

## 2021-01-06 MED ORDER — INSULIN ASPART 100 UNIT/ML IJ SOLN
0.0000 [IU] | Freq: Every day | INTRAMUSCULAR | Status: DC
  Administered 2021-01-06: 2 [IU] via SUBCUTANEOUS

## 2021-01-06 MED ORDER — PHENYLEPHRINE 40 MCG/ML (10ML) SYRINGE FOR IV PUSH (FOR BLOOD PRESSURE SUPPORT)
PREFILLED_SYRINGE | INTRAVENOUS | Status: DC | PRN
  Administered 2021-01-06 (×3): 80 ug via INTRAVENOUS

## 2021-01-06 MED ORDER — DIATRIZOATE MEGLUMINE 30 % UR SOLN
URETHRAL | Status: AC
  Filled 2021-01-06: qty 200

## 2021-01-06 MED ORDER — MIDAZOLAM HCL 2 MG/2ML IJ SOLN
INTRAMUSCULAR | Status: AC
  Filled 2021-01-06: qty 2

## 2021-01-06 MED ORDER — 0.9 % SODIUM CHLORIDE (POUR BTL) OPTIME
TOPICAL | Status: DC | PRN
  Administered 2021-01-06: 1000 mL

## 2021-01-06 MED ORDER — INSULIN ASPART 100 UNIT/ML IJ SOLN
0.0000 [IU] | Freq: Three times a day (TID) | INTRAMUSCULAR | Status: DC
  Administered 2021-01-06 – 2021-01-07 (×2): 3 [IU] via SUBCUTANEOUS

## 2021-01-06 MED ORDER — CEFAZOLIN SODIUM-DEXTROSE 2-4 GM/100ML-% IV SOLN
2.0000 g | INTRAVENOUS | Status: AC
  Administered 2021-01-06: 2 g via INTRAVENOUS
  Filled 2021-01-06: qty 100

## 2021-01-06 MED ORDER — PROPOFOL 10 MG/ML IV BOLUS
INTRAVENOUS | Status: AC
  Filled 2021-01-06: qty 40

## 2021-01-06 MED ORDER — PROPOFOL 10 MG/ML IV BOLUS
INTRAVENOUS | Status: DC | PRN
  Administered 2021-01-06: 30 mg via INTRAVENOUS
  Administered 2021-01-06: 200 mg via INTRAVENOUS
  Administered 2021-01-06: 20 mg via INTRAVENOUS

## 2021-01-06 MED ORDER — AMLODIPINE BESYLATE 5 MG PO TABS
5.0000 mg | ORAL_TABLET | Freq: Every day | ORAL | Status: DC
  Administered 2021-01-07: 5 mg via ORAL
  Filled 2021-01-06: qty 1

## 2021-01-06 MED ORDER — ONDANSETRON HCL 4 MG/2ML IJ SOLN: 4.0000 mg | INTRAMUSCULAR | Status: DC | PRN

## 2021-01-06 MED ORDER — FENTANYL CITRATE (PF) 100 MCG/2ML IJ SOLN
INTRAMUSCULAR | Status: AC
  Filled 2021-01-06: qty 2

## 2021-01-06 MED ORDER — FENTANYL CITRATE (PF) 100 MCG/2ML IJ SOLN
INTRAMUSCULAR | Status: DC | PRN
  Administered 2021-01-06 (×2): 50 ug via INTRAVENOUS

## 2021-01-06 MED ORDER — FENTANYL CITRATE (PF) 100 MCG/2ML IJ SOLN: 25.0000 ug | INTRAMUSCULAR | Status: DC | PRN

## 2021-01-06 MED ORDER — MIDAZOLAM HCL 5 MG/5ML IJ SOLN
INTRAMUSCULAR | Status: DC | PRN
  Administered 2021-01-06: 1 mg via INTRAVENOUS

## 2021-01-06 MED ORDER — CHLORHEXIDINE GLUCONATE CLOTH 2 % EX PADS
6.0000 | MEDICATED_PAD | Freq: Every day | CUTANEOUS | Status: DC
  Administered 2021-01-06 – 2021-01-07 (×2): 6 via TOPICAL

## 2021-01-06 MED ORDER — ACETAMINOPHEN 10 MG/ML IV SOLN: 1000.0000 mg | Freq: Once | INTRAVENOUS | Status: DC | PRN

## 2021-01-06 MED ORDER — STERILE WATER FOR IRRIGATION IR SOLN
Status: DC | PRN
  Administered 2021-01-06: 500 mL

## 2021-01-06 MED ORDER — ONDANSETRON HCL 4 MG/2ML IJ SOLN
INTRAMUSCULAR | Status: AC
  Filled 2021-01-06: qty 2

## 2021-01-06 MED ORDER — SODIUM CHLORIDE 0.9 % IR SOLN
3000.0000 mL | Status: DC
  Administered 2021-01-06: 3000 mL

## 2021-01-06 MED ORDER — PROPOFOL 10 MG/ML IV BOLUS
INTRAVENOUS | Status: AC
  Filled 2021-01-06: qty 20

## 2021-01-06 MED ORDER — SUGAMMADEX SODIUM 200 MG/2ML IV SOLN
INTRAVENOUS | Status: DC | PRN
  Administered 2021-01-06: 200 mg via INTRAVENOUS

## 2021-01-06 MED ORDER — ONDANSETRON HCL 4 MG/2ML IJ SOLN: 4.0000 mg | Freq: Once | INTRAMUSCULAR | Status: DC | PRN

## 2021-01-06 MED ORDER — ROCURONIUM BROMIDE 100 MG/10ML IV SOLN
INTRAVENOUS | Status: DC | PRN
  Administered 2021-01-06: 30 mg via INTRAVENOUS
  Administered 2021-01-06: 20 mg via INTRAVENOUS

## 2021-01-06 MED ORDER — CARVEDILOL 25 MG PO TABS
25.0000 mg | ORAL_TABLET | Freq: Two times a day (BID) | ORAL | Status: DC
  Administered 2021-01-06 – 2021-01-07 (×2): 25 mg via ORAL
  Filled 2021-01-06 (×2): qty 1

## 2021-01-06 MED ORDER — SODIUM CHLORIDE 0.9 % IR SOLN
Status: DC | PRN
  Administered 2021-01-06 (×6): 3000 mL via INTRAVESICAL

## 2021-01-06 MED ORDER — DIATRIZOATE MEGLUMINE 30 % UR SOLN
URETHRAL | Status: DC | PRN
  Administered 2021-01-06: 260 mL via URETHRAL

## 2021-01-06 MED ORDER — ONDANSETRON HCL 4 MG/2ML IJ SOLN
INTRAMUSCULAR | Status: DC | PRN
  Administered 2021-01-06: 4 mg via INTRAVENOUS

## 2021-01-06 MED ORDER — ORAL CARE MOUTH RINSE: 15.0000 mL | Freq: Once | OROMUCOSAL | Status: DC

## 2021-01-06 SURGICAL SUPPLY — 34 items
BAG URINE DRAIN 2000ML AR STRL (UROLOGICAL SUPPLIES) ×3 IMPLANT
BAG URO CATCHER STRL LF (MISCELLANEOUS) ×3 IMPLANT
CATH FOLEY 2WAY SLVR  5CC 18FR (CATHETERS) ×3
CATH FOLEY 2WAY SLVR 5CC 18FR (CATHETERS) ×1 IMPLANT
CATH FOLEY 3WAY 30CC 24FR (CATHETERS) ×3
CATH INTERMIT  6FR 70CM (CATHETERS) ×3 IMPLANT
CATH URTH STD 24FR FL 3W 2 (CATHETERS) ×1 IMPLANT
CLOTH BEACON ORANGE TIMEOUT ST (SAFETY) ×3 IMPLANT
CNTNR URN SCR LID CUP LEK RST (MISCELLANEOUS) ×1 IMPLANT
CONT SPEC 4OZ STRL OR WHT (MISCELLANEOUS) ×3
DRAPE FOOT SWITCH (DRAPES) ×3 IMPLANT
ELECT REM PT RETURN 15FT ADLT (MISCELLANEOUS) IMPLANT
GLOVE SURG ENC MOIS LTX SZ7.5 (GLOVE) ×3 IMPLANT
GLOVE SURG POLYISO LF SZ6 (GLOVE) ×3 IMPLANT
GLOVE SURG PR MICRO ENCORE 7.5 (GLOVE) ×3 IMPLANT
GLOVE SURG UNDER POLY LF SZ6.5 (GLOVE) ×3 IMPLANT
GLOVE SURG UNDER POLY LF SZ7 (GLOVE) ×3 IMPLANT
GOWN STRL REUS W/TWL XL LVL3 (GOWN DISPOSABLE) ×9 IMPLANT
GUIDEWIRE STR DUAL SENSOR (WIRE) ×3 IMPLANT
HOLDER FOLEY CATH W/STRAP (MISCELLANEOUS) ×3 IMPLANT
IV NS IRRIG 3000ML ARTHROMATIC (IV SOLUTION) ×18 IMPLANT
KIT TURNOVER KIT A (KITS) ×3 IMPLANT
LOOP CUT BIPOLAR 24F LRG (ELECTROSURGICAL) ×3 IMPLANT
MANIFOLD NEPTUNE II (INSTRUMENTS) ×3 IMPLANT
NS IRRIG 1000ML POUR BTL (IV SOLUTION) ×3 IMPLANT
PACK CYSTO (CUSTOM PROCEDURE TRAY) ×3 IMPLANT
PENCIL SMOKE EVACUATOR (MISCELLANEOUS) IMPLANT
SYR 30ML LL (SYRINGE) ×3 IMPLANT
SYR TOOMEY IRRIG 70ML (MISCELLANEOUS) ×3
SYRINGE TOOMEY IRRIG 70ML (MISCELLANEOUS) ×1 IMPLANT
TUBING CONNECTING 10 (TUBING) ×2 IMPLANT
TUBING CONNECTING 10' (TUBING) ×1
TUBING UROLOGY SET (TUBING) ×3 IMPLANT
WATER STERILE IRR 500ML POUR (IV SOLUTION) ×3 IMPLANT

## 2021-01-06 NOTE — Anesthesia Procedure Notes (Signed)
Procedure Name: Intubation Date/Time: 01/06/2021 9:29 AM Performed by: Gwyndolyn Saxon, CRNA Pre-anesthesia Checklist: Patient identified, Emergency Drugs available, Suction available and Patient being monitored Patient Re-evaluated:Patient Re-evaluated prior to induction Oxygen Delivery Method: Circle system utilized Preoxygenation: Pre-oxygenation with 100% oxygen Induction Type: IV induction Ventilation: Mask ventilation without difficulty Laryngoscope Size: Glidescope and 3 Grade View: Grade I Tube type: Oral Tube size: 7.5 mm Number of attempts: 2 Airway Equipment and Method: Patient positioned with wedge pillow and Rigid stylet Placement Confirmation: ETT inserted through vocal cords under direct vision, positive ETCO2 and breath sounds checked- equal and bilateral Secured at: 23 cm Tube secured with: Tape Dental Injury: Teeth and Oropharynx as per pre-operative assessment  Difficulty Due To: Difficult Airway- due to reduced neck mobility Comments: Pt has no extension in his neck. DL #1 with miller 2; no view and no attempt to pass ETT. DL #2 with glidescope; grade 1 view.

## 2021-01-06 NOTE — H&P (Signed)
CC/HPI: CC: History urinary retention with bilateral hydronephrosis and abdominal versus bladder mass  HPI:  65 year old male was recently admitted to the hospital with failure to thrive. A CT of the abdomen and pelvis without contrast revealed severe bilateral hydronephrosis down to the level of a markedly irregular bladder with masslike extension of the bladder dome appearing to be a large mass. On my review, is difficult to tell if this mass is in the a bladder dome mass versus a primary abdominal mass. He presents for cystoscopy for further evaluation. He has been tolerating the catheter well.   12/17/2020  Patient is status post MRI. This revealed fluid-filled diverticulum without any mass in the bladder. I had performed a cystoscopy that revealed an obstructing prostate but I did not see any obvious diverticulum or mass but there was some impaired visualization due to his chronic catheterization. MRI did confirm enlarged prostate with median lobe.     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   Tobacco Use Assessment Completed: Used Tobacco in  last 30 days? Does not use smokeless tobacco. Does not use drugs. Drinks 1 caffeinated drink per day. Patient's occupation is/was Retired.    REVIEW OF SYSTEMS:    GU Review Male:   Patient denies frequent urination, hard to postpone urination, burning/ pain with urination, get up at night to urinate, leakage of urine, stream starts and stops, trouble starting your stream, have to strain to urinate , erection problems, and penile pain.  Gastrointestinal (Upper):   Patient denies nausea, vomiting, and indigestion/ heartburn.  Gastrointestinal (Lower):   Patient denies diarrhea and constipation.  Constitutional:   Patient denies fever, night sweats, weight loss, and fatigue.  Skin:   Patient denies skin rash/ lesion and itching.  Eyes:   Patient denies blurred vision and double vision.  Ears/ Nose/ Throat:   Patient denies sore throat and sinus problems.  Hematologic/Lymphatic:   Patient denies swollen glands and easy bruising.  Cardiovascular:   Patient denies leg swelling and chest pains.  Respiratory:   Patient denies cough and shortness of breath.  Endocrine:   Patient denies excessive thirst.  Musculoskeletal:   Patient denies back pain and joint pain.  Neurological:   Patient denies headaches and dizziness.  Psychologic:   Patient denies depression and anxiety.   VITAL SIGNS: None   Complexity of Data:  Source Of History:  Patient  X-Ray Review: MRI Abdomen: Reviewed Films. Reviewed Report. Discussed With Patient.     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 He would like to proceed with surgical resection. I discussed bipolar  transurethral resection of the prostate. I specifically discussed the risks including but not limited to bleeding which could require blood transfusion, infection, and injury to surrounding structures. Also discussed the possibility that the surgery would not improve symptoms though most men have a great improvement in their symptoms. Also discussed the low likelihood but possibility of development of new symptoms such as irritative voiding symptoms or urinary incontinence. Most men will have some degree of urinary urgency and discomfort immediately following the surgery that resolves in a short amount of time. He understands that most often this is an outpatient procedure but occasionally patients require hospitalization for continuous bladder irrigation in the event of excess bleeding. He also understands the possibility of being sent home with a urethral catheter. The patient expressed understanding and is eager to proceed. I will plan to also consent him for TURBT in the event that I see a lesion in the bladder. I briefly discussed the role of diverticulectomy which may be needed depending on how he does after TURP. I think it is appropriate to relieve his bladder outlet obstruction 1st.   Cc: Dr. Isaac Bliss    Signed by Link Snuffer, III, M.D. on 12/17/20 at 8:47 AM (EDT

## 2021-01-06 NOTE — Transfer of Care (Signed)
Immediate Anesthesia Transfer of Care Note  Patient: [name]  Procedure(s) Performed: CYSTOSCOPY WITH CYSTOGRAM/TRANSURETHRAL RESECTION OF THE PROSTATE (TURP) (Bladder)  Patient Location: PACU  Anesthesia Type:General  Level of Consciousness: drowsy and patient cooperative  Airway & Oxygen Therapy: Patient Spontanous Breathing and Patient connected to face mask oxygen  Post-op Assessment: Report given to RN and Post -op Vital signs reviewed and stable  Post vital signs: Reviewed and stable  Last Vitals:  Vitals Value Taken Time  BP 156/95 01/06/21 1100  Temp    Pulse 70 01/06/21 1104  Resp 17 01/06/21 1104  SpO2 100 % 01/06/21 1104  Vitals shown include unvalidated device data.  Last Pain:  Vitals:   01/06/21 0702  TempSrc: Oral         Complications: No notable events documented.

## 2021-01-06 NOTE — Anesthesia Preprocedure Evaluation (Addendum)
Anesthesia Evaluation  Patient identified by MRN, date of birth, ID band Patient awake    Reviewed: Allergy & Precautions, NPO status , Patient's Chart, lab work & pertinent test results  Airway Mallampati: III  TM Distance: >3 FB Neck ROM: Full    Dental no notable dental hx.    Pulmonary neg pulmonary ROS,    Pulmonary exam normal breath sounds clear to auscultation       Cardiovascular hypertension, Pt. on medications and Pt. on home beta blockers + CAD, + CABG, + Peripheral Vascular Disease and +CHF  Normal cardiovascular exam Rhythm:Regular Rate:Normal  ECG: ST, rate 105  ECHO: 1. Left ventricular ejection fraction, by estimation, is 55 to 60%. The left ventricle has normal function. The left ventricle has no regional wall motion abnormalities. Left ventricular diastolic parameters were normal. 2. Right ventricular systolic function is normal. The right ventricular size is normal. 3. Left atrial size was mildly dilated. 4. The mitral valve is normal in structure. Trivial mitral valve regurgitation. No evidence of mitral stenosis. 5. The aortic valve is normal in structure. Aortic valve regurgitation is trivial. No aortic stenosis is present. 6. The inferior vena cava is normal in size with greater than 50% respiratory variability, suggesting right atrial pressure of 3 mmHg.   Neuro/Psych negative neurological ROS  negative psych ROS   GI/Hepatic negative GI ROS, Neg liver ROS,   Endo/Other  negative endocrine ROSdiabetes  Renal/GU Renal InsufficiencyRenal disease     Musculoskeletal  (+) Arthritis ,   Abdominal   Peds  Hematology  (+) anemia , HLD   Anesthesia Other Findings BENIGN PROSTATE HYPERPLASIA  Reproductive/Obstetrics                            Anesthesia Physical Anesthesia Plan  ASA: 3  Anesthesia Plan: General   Post-op Pain Management:    Induction:  Intravenous  PONV Risk Score and Plan: 3 and Ondansetron, Dexamethasone, Midazolam and Treatment may vary due to age or medical condition  Airway Management Planned: Oral ETT  Additional Equipment:   Intra-op Plan:   Post-operative Plan: Extubation in OR  Informed Consent: I have reviewed the patients History and Physical, chart, labs and discussed the procedure including the risks, benefits and alternatives for the proposed anesthesia with the patient or authorized representative who has indicated his/her understanding and acceptance.     Dental advisory given  Plan Discussed with: CRNA  Anesthesia Plan Comments:        Anesthesia Quick Evaluation

## 2021-01-06 NOTE — Op Note (Signed)
Preoperative diagnosis: Bladder outlet obstruction secondary to BPH Possible bladder diverticulum versus urachal diverticulum  Postoperative diagnosis:  Bladder outlet obstruction secondary to BPH Possible bladder diverticulum versus urachal diverticulum  Procedure:  Cystoscopy Intraoperative cystogram Transurethral resection of the prostate  Surgeon: Eugene D Bell, III. M.D.  Anesthesia: General  Complications: None  EBL: Minimal  Specimens: Prostate chips  Indication: [name] is a patient with bladder outlet obstruction secondary to benign prostatic hyperplasia. After reviewing the management options for treatment, he elected to proceed with the above surgical procedure(s). We have discussed the potential benefits and risks of the procedure, side effects of the proposed treatment, the likelihood of the patient achieving the goals of the procedure, and any potential problems that might occur during the procedure or recuperation. Informed consent has been obtained.  Description of procedure:  The patient was taken to the operating room and general anesthesia was induced.  The patient was placed in the dorsal lithotomy position, prepped and draped in the usual sterile fashion, and preoperative antibiotics were administered. A preoperative time-out was performed.   Cystourethroscopy was performed.  The patient's urethra was examined and demonstrated bilobar prostatic hypertrophy with a median lobe.   The bladder was then systematically examined in its entirety. There was no evidence of any bladder tumors, stones, or other mucosal pathology.  There was some evidence of catheter edema.  There was a small pinpoint opening that I thought might be the opening to the bladder diverticulum versus urachal diverticulum.  I tried to pass a wire through this and it met resistance.  I did not see any other openings throughout the entire bladder mucosa.  I decided to perform cystogram.   I withdrew the scope and placed an 18 French Foley catheter.  I instilled about 300 cc of cystogram into the bladder.  There was reflux on the left and the bladder was at maximum capacity.  There was good distention of the bladder with some trabeculation but no obvious contrast extending into a diverticulum.  The ureteral orifices were identified and marked so as to be avoided during the procedure.  The prostate adenoma was then resected utilizing loop cautery resection with the bipolar cutting loop.  The prostate adenoma from the bladder neck back to the verumontanum was resected beginning at the six o'clock position and then extended to include the right and left lobes of the prostate and anterior prostate. Care was taken not to resect distal to the verumontanum.  Hemostasis was then achieved with the cautery and the bladder was emptied and reinspected with no significant bleeding noted at the end of the procedure.    A 24 French 3 way catheter was then placed into the bladder.  The patient appeared to tolerate the procedure well and without complications.  The patient was able to be awakened and transferred to the recovery unit in satisfactory condition.   

## 2021-01-06 NOTE — Anesthesia Postprocedure Evaluation (Signed)
Anesthesia Post Note  Patient: [name]  Procedure(s) Performed: CYSTOSCOPY WITH CYSTOGRAM/TRANSURETHRAL RESECTION OF THE PROSTATE (TURP) (Bladder)     Patient location during evaluation: PACU Anesthesia Type: General Level of consciousness: awake Pain management: pain level controlled Vital Signs Assessment: post-procedure vital signs reviewed and stable Respiratory status: spontaneous breathing, nonlabored ventilation, respiratory function stable and patient connected to nasal cannula oxygen Cardiovascular status: blood pressure returned to baseline and stable Postop Assessment: no apparent nausea or vomiting Anesthetic complications: no   No notable events documented.  Last Vitals:  Vitals:   01/06/21 1300 01/06/21 1400  BP: (!) 152/81 (!) 145/79  Pulse: 67 71  Resp: 12 18  Temp:    SpO2: 100% 100%    Last Pain:  Vitals:   01/06/21 1400  TempSrc:   PainSc: 0-No pain                 [name]

## 2021-01-07 ENCOUNTER — Encounter (HOSPITAL_COMMUNITY): Payer: Self-pay | Admitting: Urology

## 2021-01-07 ENCOUNTER — Telehealth: Payer: Self-pay | Admitting: Cardiology

## 2021-01-07 DIAGNOSIS — C61 Malignant neoplasm of prostate: Secondary | ICD-10-CM | POA: Diagnosis not present

## 2021-01-07 LAB — CBC
HCT: 35.8 % — ABNORMAL LOW (ref 39.0–52.0)
Hemoglobin: 11.4 g/dL — ABNORMAL LOW (ref 13.0–17.0)
MCH: 27.6 pg (ref 26.0–34.0)
MCHC: 31.8 g/dL (ref 30.0–36.0)
MCV: 86.7 fL (ref 80.0–100.0)
Platelets: 233 10*3/uL (ref 150–400)
RBC: 4.13 MIL/uL — ABNORMAL LOW (ref 4.22–5.81)
RDW: 14.6 % (ref 11.5–15.5)
WBC: 11.4 10*3/uL — ABNORMAL HIGH (ref 4.0–10.5)
nRBC: 0 % (ref 0.0–0.2)

## 2021-01-07 LAB — BASIC METABOLIC PANEL
Anion gap: 9 (ref 5–15)
BUN: 27 mg/dL — ABNORMAL HIGH (ref 8–23)
CO2: 26 mmol/L (ref 22–32)
Calcium: 9 mg/dL (ref 8.9–10.3)
Chloride: 106 mmol/L (ref 98–111)
Creatinine, Ser: 1.55 mg/dL — ABNORMAL HIGH (ref 0.61–1.24)
GFR, Estimated: 49 mL/min — ABNORMAL LOW (ref >60)
Glucose, Bld: 158 mg/dL — ABNORMAL HIGH (ref 70–99)
Potassium: 3.8 mmol/L (ref 3.5–5.1)
Sodium: 141 mmol/L (ref 135–145)

## 2021-01-07 LAB — GLUCOSE, CAPILLARY: Glucose-Capillary: 160 mg/dL — ABNORMAL HIGH (ref 70–99)

## 2021-01-07 LAB — SURGICAL PATHOLOGY

## 2021-01-07 NOTE — Progress Notes (Signed)
Urology Progress Note   1 Day Post-Op from TURP with Dr. Gloriann Loan on 01/06/21.   Subjective: NAEON.   Objective: Vital signs in last 24 hours: Temp:  [97.5 F (36.4 C)-98.4 F (36.9 C)] 97.9 F (36.6 C) (06/28 0519) Pulse Rate:  [60-97] 60 (06/28 0519) Resp:  [12-19] 19 (06/28 0519) BP: (144-201)/(73-95) 151/85 (06/28 0519) SpO2:  [97 %-100 %] 97 % (06/28 0519) Weight:  [76.7 kg] 76.7 kg (06/27 1518)  Intake/Output from previous day: 06/27 0701 - 06/28 0700 In: 2960 [P.O.:360; I.V.:800; IV Piggyback:300] Out: 3760 [Urine:3750; Blood:10] Intake/Output this shift: Total I/O In: 520 [P.O.:120; Other:300; IV Piggyback:100] Out: 1300 [Urine:1300]  Physical Exam:  General: Alert and oriented CV: Regular rate Lungs: No increased work of breathing Abdomen:  Soft, non tender.  GU: Foley in place draining clear pink urine, CBI on slow drip  Ext: NT, No erythema  Lab Results: Recent Labs    01/07/21 0310  HGB 11.4*  HCT 35.8*   Recent Labs    01/07/21 0310  NA 141  K 3.8  CL 106  CO2 26  GLUCOSE 158*  BUN 27*  CREATININE 1.55*  CALCIUM 9.0    Studies/Results: DG C-Arm 1-60 Min-No Report  Result Date: 01/06/2021 Fluoroscopy was utilized by the requesting physician.  No radiographic interpretation.    Assessment/Plan:  65 y.o. male s/p TURP on 01/06/21 with Dr. Gloriann Loan.  Overall doing well post-op.   - Clamp CBI - Ambulate - [place], regular diet - D/C IV Pain meds - Resume home baby aspirin  Dispo: home today with foley    LOS: 0 days

## 2021-01-07 NOTE — Progress Notes (Signed)
Patient discharged home, discharge instructions given and explained to patient. Reviewed foley care and management for home, patient verbalized he is aware on foley care because she has had foley at home and comfortable managing it at home. Patient denies any pain or distress, accompanied home by wife.

## 2021-01-07 NOTE — Discharge Summary (Signed)
[facility] Discharge Summary  Admit date: 01/06/2021  Discharge date and time: 01/07/21   Discharge to: Home  Discharge Service: Urology  Discharge Attending Physician:  Link Snuffer, MD  Discharge  Diagnoses: <principal problem not specified>  Secondary Diagnosis: Active Problems:   S/P TURP   OR Procedures: Procedure(s): CYSTOSCOPY WITH CYSTOGRAM/TRANSURETHRAL RESECTION OF THE PROSTATE (TURP) 01/06/2021   Ancillary Procedures: None   Discharge Day Services: The patient was seen and examined by the Urology team both in the morning and immediately prior to discharge.  Vital signs and laboratory values were stable and within normal limits.  The physical exam was benign and unchanged and all surgical wounds were examined.  Discharge instructions were explained and all questions answered.  Subjective  No acute events overnight. Pain Controlled. No fever or chills.  Objective Patient Vitals for the past 8 hrs:  BP Temp Temp src Pulse Resp SpO2  01/07/21 0519 (!) 151/85 97.9 F (36.6 C) Oral 60 19 97 %  01/07/21 0058 (!) 144/73 98 F (36.7 C) Oral 67 15 99 %   No intake/output data recorded.  General Appearance:        No acute distress Lungs:                       Normal work of breathing on room air Heart:                                Regular rate and rhythm Abdomen:                         Soft, non-tender, non-distended GU:    Foley in place. Clear pink output.  Extremities:                      Warm and well perfused   Hospital Course:  The patient underwent TURP on 01/06/2021.  The patient tolerated the procedure well, was extubated in the OR, and afterwards was taken to the PACU for routine post-surgical care. When stable the patient was transferred to the floor.   The patient did well postoperatively.  The patient's diet was slowly advanced and at the time of discharge was tolerating a regular diet.  The patient was discharged home 1 Day Post-Op, at which point  was tolerating a regular solid diet, CBI clamped with pink output, have adequate pain control with P.O. pain medication, and could ambulate without difficulty. The patient will follow up with [place] for post op check.   Condition at Discharge: Improved  Discharge Medications:  Allergies as of 01/07/2021       Reactions   Metformin And Related Diarrhea        Medication List     STOP taking these medications    acyclovir 400 MG tablet Commonly known as: ZOVIRAX   apixaban 2.5 MG Tabs tablet Commonly known as: ELIQUIS       TAKE these medications    Accu-Chek Guide Me w/Device Kit 1 each by Does not apply route.   Accu-Chek Guide test strip Generic drug: glucose blood Use Accu Chek test strips as instructed to check blood sugar fasting or two hours after meals, every other day.   Accu-Chek Softclix Lancets lancets Use Accu Chek softclix lancets to check blood sugar fasting or 2 hours after a meal every other day.   amLODipine 5 MG  tablet Commonly known as: NORVASC Take 1 tablet (5 mg total) by mouth daily.   aspirin EC 81 MG tablet Take 81 mg by mouth daily. Swallow whole.   carvedilol 25 MG tablet Commonly known as: COREG Take 25 mg by mouth 2 (two) times daily with a meal.       ASK your doctor about these medications    atorvastatin 80 MG tablet Commonly known as: LIPITOR Take 1 tablet by mouth once daily   hydrALAZINE 50 MG tablet Commonly known as: APRESOLINE TAKE 1 TABLET BY MOUTH THREE TIMES DAILY   isosorbide mononitrate 30 MG 24 hr tablet Commonly known as: IMDUR Take 1 tablet (30 mg total) by mouth daily.   potassium chloride SA 20 MEQ tablet Commonly known as: KLOR-CON Take 1 tablet by mouth once daily   prochlorperazine 10 MG tablet Commonly known as: COMPAZINE Take 1 tablet (10 mg total) by mouth every 6 (six) hours as needed (Nausea or vomiting).   repaglinide 1 MG tablet Commonly known as: PRANDIN TAKE 1 TABLET BY MOUTH THREE TIMES  DAILY BEFORE MEAL(S)   tamsulosin 0.4 MG Caps capsule Commonly known as: FLOMAX Take 1 capsule (0.4 mg total) by mouth daily.

## 2021-01-07 NOTE — Telephone Encounter (Signed)
*  STAT* If patient is at the pharmacy, call can be transferred to refill team.   1. Which medications need to be refilled? (please list name of each medication and dose if known) isosorbide  30  2. Which pharmacy/location (including street and city if local pharmacy) is medication to be sent to? [facility]   3. Do they need a 30 day or 90 day supply? [place]

## 2021-01-07 NOTE — Discharge Instructions (Signed)
You underwent TURP with Dr. Gloriann Loan on 01/06/21. You are being discharged home.   Please keep your scheduled follow up with [place] for foley removal.   You may see some blood in the urine and may have some burning with urination for 48-72 hours. You also may notice that you have to urinate more frequently or urgently after your procedure which is normal.  You should call should you develop an inability urinate, fever > 101, persistent nausea and vomiting that prevents you from eating or drinking to stay hydrated.  If you have a stent, you will likely urinate more frequently and urgently until the stent is removed and you may experience some discomfort/pain in the lower abdomen and flank especially when urinating. You may take pain medication prescribed to you if needed for pain. You may also intermittently have blood in the urine until the stent is removed. If you have a catheter, you will be taught how to take care of the catheter by the nursing staff prior to discharge from the hospital.  You may periodically feel a strong urge to void with the catheter in place.  This is a bladder spasm and most often can occur when having a bowel movement or moving around. It is typically self-limited and usually will stop after a few minutes.  You may use some Vaseline or Neosporin around the tip of the catheter to reduce friction at the tip of the penis. You may also see some blood in the urine.  A very small amount of blood can make the urine look quite red.  As long as the catheter is draining well, there usually is not a problem.  However, if the catheter is not draining well and is bloody, you should call the office [phone] to notify us.    If you have any issues, you should call the office [phone]) to notify us.

## 2021-01-07 NOTE — Plan of Care (Signed)
  Problem: Health Behavior/Discharge Planning: Goal: Ability to manage health-related needs will improve Outcome: Progressing   Problem: Elimination: Goal: Will not experience complications related to urinary retention Outcome: Progressing   Problem: Pain Managment: Goal: General experience of comfort will improve Outcome: Progressing

## 2021-01-08 MED ORDER — ISOSORBIDE MONONITRATE ER 30 MG PO TB24: 30.0000 mg | ORAL_TABLET | Freq: Every day | ORAL | 2 refills | Status: DC

## 2021-01-10 LAB — TYPE AND SCREEN
ABO/RH(D): O POS
Antibody Screen: POSITIVE
Unit division: 0
Unit division: 0

## 2021-01-10 LAB — BPAM RBC
Blood Product Expiration Date: 202207302359
Blood Product Expiration Date: 202208032359
Unit Type and Rh: 9500
Unit Type and Rh: 9500

## 2021-02-10 ENCOUNTER — Other Ambulatory Visit (INDEPENDENT_AMBULATORY_CARE_PROVIDER_SITE_OTHER): Payer: Medicare Other

## 2021-02-10 ENCOUNTER — Other Ambulatory Visit: Payer: Medicare Other

## 2021-02-10 ENCOUNTER — Other Ambulatory Visit: Payer: Self-pay

## 2021-02-10 DIAGNOSIS — E1165 Type 2 diabetes mellitus with hyperglycemia: Secondary | ICD-10-CM | POA: Diagnosis not present

## 2021-02-10 DIAGNOSIS — I10 Essential (primary) hypertension: Secondary | ICD-10-CM

## 2021-02-10 DIAGNOSIS — E876 Hypokalemia: Secondary | ICD-10-CM

## 2021-02-10 LAB — HEMOGLOBIN A1C: Hgb A1c MFr Bld: 8.5 % — ABNORMAL HIGH (ref 4.6–6.5)

## 2021-02-10 LAB — BASIC METABOLIC PANEL
BUN: 35 mg/dL — ABNORMAL HIGH (ref 6–23)
CO2: 20 mEq/L (ref 19–32)
Calcium: 9.3 mg/dL (ref 8.4–10.5)
Chloride: 106 mEq/L (ref 96–112)
Creatinine, Ser: 1.8 mg/dL — ABNORMAL HIGH (ref 0.40–1.50)
GFR: 38.98 mL/min — ABNORMAL LOW (ref >60.00)
Glucose, Bld: 218 mg/dL — ABNORMAL HIGH (ref 70–99)
Potassium: 3.6 mEq/L (ref 3.5–5.1)
Sodium: 136 mEq/L (ref 135–145)

## 2021-02-13 ENCOUNTER — Encounter: Payer: Self-pay | Admitting: Endocrinology

## 2021-02-13 ENCOUNTER — Other Ambulatory Visit: Payer: Self-pay

## 2021-02-13 ENCOUNTER — Ambulatory Visit (INDEPENDENT_AMBULATORY_CARE_PROVIDER_SITE_OTHER): Payer: Medicare Other | Admitting: Endocrinology

## 2021-02-13 VITALS — BP 148/78 | HR 77 | Ht 69.0 in | Wt 171.2 lb

## 2021-02-13 DIAGNOSIS — I25810 Atherosclerosis of coronary artery bypass graft(s) without angina pectoris: Secondary | ICD-10-CM

## 2021-02-13 DIAGNOSIS — E876 Hypokalemia: Secondary | ICD-10-CM

## 2021-02-13 DIAGNOSIS — E1165 Type 2 diabetes mellitus with hyperglycemia: Secondary | ICD-10-CM | POA: Diagnosis not present

## 2021-02-13 DIAGNOSIS — I10 Essential (primary) hypertension: Secondary | ICD-10-CM

## 2021-02-13 LAB — ALDOSTERONE + RENIN ACTIVITY W/ RATIO
ALDOS/RENIN RATIO: 0.5 (ref 0.0–30.0)
ALDOSTERONE: 2.5 ng/dL (ref 0.0–30.0)
Renin: 4.666 ng/mL/hr (ref 0.167–5.380)

## 2021-02-13 MED ORDER — REPAGLINIDE 2 MG PO TABS: 2.0000 mg | ORAL_TABLET | Freq: Three times a day (TID) | ORAL | 2 refills | Status: DC

## 2021-02-13 NOTE — Patient Instructions (Addendum)
Take 2 pills of REPAGLANIDE before each meal  Check blood sugars on waking up  2-3 days a week  Also check blood sugars about 2 hours after meals and do this after different meals by rotation  Recommended blood sugar levels on waking up are 90-130 and about 2 hours after meal is 130-180  Please bring your blood sugar monitor to each visit, thank you  If not eating any starchy dinner take only 1 pill

## 2021-02-13 NOTE — Progress Notes (Signed)
Patient ID: [name], male   DOB: 07/14/1955, 65 y.o.   MRN: [id number]           Reason for Appointment: Follow-up for Type 2 Diabetes   History of Present Illness:          Date of diagnosis of type 2 diabetes mellitus:   2018      Background history:  He has never been told to have diabetes but review of his previous labs indicate that he had high blood sugar 179 in 2014 His highest sugar has been 234 in 10/2016 when his A1c was 8.8, not clear if he was symptomatic at that time  Recent history:   A1c is much higher at 8.5   Non-insulin hypoglycemic drugs the patient is taking are:   Prandin 1 mg 3 times daily  Current management, blood sugar patterns and problems identified:  He did bring his monitor for download He has checked his blood sugars about every other day at home but all of them are in the mornings  Lab glucose was over 200 but his fasting readings at home are averaging below 150 He only has had 1 high reading of 218 in the morning His weight has started coming back up He is eating 3 meals a day and likely more than before  He will usually will have some carbohydrate at dinnertime and at lunchtime usually will have a cheeseburger  Has a balanced breakfast with some protein in the morning  he is not doing much exercise            Glucose monitoring:   has Accu-Chek guide meter  Blood sugars FASTING averaging 144 range 102-218  Dietician visit, most recent: 2/21  Weight history:  Wt Readings from Last 3 Encounters:  02/13/21 171 lb 3.2 oz (77.7 kg)  01/06/21 169 lb 1.5 oz (76.7 kg)  01/02/21 170 lb (77.1 kg)    Glycemic control:   Lab Results  Component Value Date   HGBA1C 8.5 (H) 02/10/2021   HGBA1C 6.6 (H) 01/02/2021   HGBA1C 5.9 11/08/2020   Lab Results  Component Value Date   MICROALBUR 12.6 (H) 07/21/2018   LDLCALC 39 09/04/2019   CREATININE 1.80 (H) 02/10/2021   Lab Results  Component Value Date   MICRALBCREAT 10.0  07/21/2018    Lab Results  Component Value Date   FRUCTOSAMINE 237 11/08/2020   FRUCTOSAMINE 327 (H) 05/28/2020   FRUCTOSAMINE 345 (H) 04/05/2020   Lab Results  Component Value Date   HGB 11.4 (L) 01/07/2021    Lab on 02/10/2021  Component Date Value Ref Range Status   ALDOSTERONE 02/10/2021 2.5  0.0 - 30.0 ng/dL Final   Renin 02/10/2021 4.666  0.167 - 5.380 ng/mL/hr Final   ALDOS/RENIN RATIO 02/10/2021 0.5  0.0 - 30.0 Final                            Units:      ng/dL per ng/mL/hr   Hgb A1c MFr Bld 02/10/2021 8.5 (A) 4.6 - 6.5 % Final   Glycemic Control Guidelines for People with Diabetes:Non Diabetic:  <6%Goal of Therapy: <7%Additional Action Suggested:  >8%    Sodium 02/10/2021 136  135 - 145 mEq/L Final   Potassium 02/10/2021 3.6  3.5 - 5.1 mEq/L Final   Chloride 02/10/2021 106  96 - 112 mEq/L Final   CO2 02/10/2021 20  19 - 32 mEq/L Final   Glucose,  Bld 02/10/2021 218 (A) 70 - 99 mg/dL Final   BUN 02/10/2021 35 (A) 6 - 23 mg/dL Final   Creatinine, Ser 02/10/2021 1.80 (A) 0.40 - 1.50 mg/dL Final   GFR 02/10/2021 38.98 (A) >60.00 mL/min Final   Calculated using the CKD-EPI Creatinine Equation (2021)   Calcium 02/10/2021 9.3  8.4 - 10.5 mg/dL Final    Allergies as of 02/13/2021       Reactions   Metformin And Related Diarrhea        Medication List        Accurate as of February 13, 2021  2:49 PM. If you have any questions, ask your nurse or doctor.          Accu-Chek Guide Me w/Device Kit 1 each by Does not apply route.   Accu-Chek Guide test strip Generic drug: glucose blood Use Accu Chek test strips as instructed to check blood sugar fasting or two hours after meals, every other day.   Accu-Chek Softclix Lancets lancets Use Accu Chek softclix lancets to check blood sugar fasting or 2 hours after a meal every other day.   amLODipine 5 MG tablet Commonly known as: NORVASC Take 1 tablet (5 mg total) by mouth daily.   aspirin EC 81 MG tablet Take 81 mg  by mouth daily. Swallow whole.   atorvastatin 80 MG tablet Commonly known as: LIPITOR Take 1 tablet by mouth once daily   carvedilol 25 MG tablet Commonly known as: COREG Take 25 mg by mouth 2 (two) times daily with a meal.   hydrALAZINE 50 MG tablet Commonly known as: APRESOLINE TAKE 1 TABLET BY MOUTH THREE TIMES DAILY   isosorbide mononitrate 30 MG 24 hr tablet Commonly known as: IMDUR Take 1 tablet (30 mg total) by mouth daily.   potassium chloride SA 20 MEQ tablet Commonly known as: KLOR-CON Take 1 tablet by mouth once daily   prochlorperazine 10 MG tablet Commonly known as: COMPAZINE Take 1 tablet (10 mg total) by mouth every 6 (six) hours as needed (Nausea or vomiting).   repaglinide 2 MG tablet Commonly known as: Prandin Take 1 tablet (2 mg total) by mouth 3 (three) times daily before meals. What changed:  medication strength See the new instructions. Changed by: Elayne Snare, MD   tamsulosin 0.4 MG Caps capsule Commonly known as: FLOMAX Take 1 capsule (0.4 mg total) by mouth daily.        Allergies:  Allergies  Allergen Reactions   Metformin And Related Diarrhea    Past Medical History:  Diagnosis Date   Anemia    Arthritis    "left knee" (11/24/2017)   CAD (coronary artery disease)    CABG 2002   Cancer [facility])    Multiple Myeloma   CHF (congestive heart failure) ([place]) 2021   Chronic kidney disease    Chronic renal insufficiency    Diabetes mellitus without complication ([place])    Dilated cardiomyopathy ([place])    echo in 2009 showed improvement with a normal EF   HTN (hypertension)    Hyperlipemia    Hypertension    Noncompliance    PAD (peripheral artery disease) ([place])    Pneumonia     Past Surgical History:  Procedure Laterality Date   CARDIAC CATHETERIZATION  2002, 2006   CORONARY ARTERY BYPASS GRAFT  2002   x4 DR. VAN TRIGT   IR FLUORO GUIDED NEEDLE PLC ASPIRATION/INJECTION LOC  10/24/2019   TRANSURETHRAL RESECTION OF PROSTATE N/A  01/06/2021   Procedure: CYSTOSCOPY  WITH CYSTOGRAM/TRANSURETHRAL RESECTION OF THE PROSTATE (TURP);  Surgeon: Lucas Mallow, MD;  Location: [facility];  Service: Urology;  Laterality: N/A;    Family History  Problem Relation Age of Onset   Hypertension Father    Diabetes Mother        DIABETIC COMA   Healthy Daughter     Social History:  reports that he has never smoked. He has never used smokeless tobacco. He reports previous alcohol use. He reports previous drug use.   Review of Systems   Lipid history: On atorvastatin 80 mg from his cardiologist since diagnosis of CAD, labs as follows    Lab Results  Component Value Date   CHOL 92 (L) 09/04/2019   HDL 35 (L) 09/04/2019   LDLCALC 39 09/04/2019   LDLDIRECT 61.0 11/08/2020   TRIG 89 09/04/2019   CHOLHDL 2.6 09/04/2019           Hypertension: Has been followed by cardiologist for blood pressure management Blood pressure appears persistently high  Aldosterone level has been checked for his history of hypokalemia and is normal  Although he does not think he can swallow large tablets he does take potassium supplements, last prescription was sent in 11/21  Recent readings:  BP Readings from Last 3 Encounters:  02/13/21 (!) 148/78  01/07/21 (!) 154/74  01/02/21 (!) 171/69   RENAL dysfunction:  Renal dysfunction not related to diabetes No history of proteinuria  Lab Results  Component Value Date   CREATININE 1.80 (H) 02/10/2021   CREATININE 1.55 (H) 01/07/2021   CREATININE 1.66 (H) 01/02/2021   Lab Results  Component Value Date   K 3.6 02/10/2021    Most recent eye exam was in 2018  Most recent foot exam: 07/2018   LABS:  Lab on 02/10/2021  Component Date Value Ref Range Status   ALDOSTERONE 02/10/2021 2.5  0.0 - 30.0 ng/dL Final   Renin 02/10/2021 4.666  0.167 - 5.380 ng/mL/hr Final   ALDOS/RENIN RATIO 02/10/2021 0.5  0.0 - 30.0 Final                            Units:      ng/dL per ng/mL/hr   Hgb A1c  MFr Bld 02/10/2021 8.5 (A) 4.6 - 6.5 % Final   Glycemic Control Guidelines for People with Diabetes:Non Diabetic:  <6%Goal of Therapy: <7%Additional Action Suggested:  >8%    Sodium 02/10/2021 136  135 - 145 mEq/L Final   Potassium 02/10/2021 3.6  3.5 - 5.1 mEq/L Final   Chloride 02/10/2021 106  96 - 112 mEq/L Final   CO2 02/10/2021 20  19 - 32 mEq/L Final   Glucose, Bld 02/10/2021 218 (A) 70 - 99 mg/dL Final   BUN 02/10/2021 35 (A) 6 - 23 mg/dL Final   Creatinine, Ser 02/10/2021 1.80 (A) 0.40 - 1.50 mg/dL Final   GFR 02/10/2021 38.98 (A) >60.00 mL/min Final   Calculated using the CKD-EPI Creatinine Equation (2021)   Calcium 02/10/2021 9.3  8.4 - 10.5 mg/dL Final    Physical Examination:  BP (!) 148/78   Pulse 77   Ht $R'5\' 9"'ej$  (1.753 m)   Wt 171 lb 3.2 oz (77.7 kg)   SpO2 98%   BMI 25.28 kg/m   No pedal edema present       ASSESSMENT:  Diabetes type 2, mild  See history of present illness for detailed discussion of current diabetes management, blood sugar patterns  and problems identified  A1c is much higher than usual at 8.5  He is on a regimen of only 1 mg Prandin before each meal Since he has gained back weight and is eating 3 full meals a day he is appearing to have more postprandial hyperglycemia  Does not appear to have consistently high fiber intake or high carbohydrate intake  Difficult to assess his blood sugars as he only checks in the mornings   this year he had lost a significant amount of weight and likely this caused his blood sugars to come down as well as not taking any steroid injections Still not monitoring blood sugars much around  Fasting blood sugars appear to be fairly good but postprandial readings after breakfast are mildly increased around 170  Hypokalemia with hypertension: Ruled out hyperaldosteronism Potassium supplemented and normal now  PLAN:      He will now take Repeglanide 2 mg before each meal unless he is eating a low carbohydrate meal  such as in the evening Discussed need to recheck his blood sugars after meals regularly and he will let us know if they are consistently high May also consider adding an SGLT2 drug Encouraged him to start walking regularly Consider follow-up with dietitian to help with meal planning  Continue potassium supplements He will follow-up with his cardiologist for blood pressure management  Likely need nephrology consultation also for abnormal renal function  Patient Instructions  Take 2 pills of REPAGLANIDE before each meal  Check blood sugars on waking up  2-3 days a week  Also check blood sugars about 2 hours after meals and do this after different meals by rotation  Recommended blood sugar levels on waking up are 90-130 and about 2 hours after meal is 130-180  Please bring your blood sugar monitor to each visit, thank you  If not eating any starchy dinner take only 1 pill     Elayne Snare 02/13/2021, 2:49 PM   Note: This office note was prepared with Dragon voice recognition system technology. Any transcriptional errors that result from this process are unintentional.

## 2021-02-19 NOTE — Progress Notes (Signed)
..  The following Assist/Replace Program for Darzalex from [place] has been terminated due to patient's insurance plan changed making him in-eligible for program.  Last DOS: 09/09/2020. Marland KitchenJuan Quam, CPhT IV Drug Replacement Specialist [place] Phone: [phone]

## 2021-02-19 NOTE — Progress Notes (Addendum)
..  The following Assist/Replace Program for Venofer from [facility] has been terminated due to patient insurance benefit has changed making him ineligible for program.  Last DOS: 06/18/2020. Marland KitchenJuan Quam, CPhT IV Drug Replacement Specialist [place] Phone: [phone]

## 2021-03-03 ENCOUNTER — Other Ambulatory Visit: Payer: Self-pay

## 2021-03-04 ENCOUNTER — Ambulatory Visit (INDEPENDENT_AMBULATORY_CARE_PROVIDER_SITE_OTHER): Payer: Medicare Other | Admitting: Internal Medicine

## 2021-03-04 VITALS — BP 180/90 | HR 84 | Temp 99.6°F | Wt 172.6 lb

## 2021-03-04 DIAGNOSIS — H66002 Acute suppurative otitis media without spontaneous rupture of ear drum, left ear: Secondary | ICD-10-CM

## 2021-03-04 DIAGNOSIS — I25810 Atherosclerosis of coronary artery bypass graft(s) without angina pectoris: Secondary | ICD-10-CM

## 2021-03-04 DIAGNOSIS — H60392 Other infective otitis externa, left ear: Secondary | ICD-10-CM

## 2021-03-04 MED ORDER — CIPROFLOXACIN-HYDROCORTISONE 0.2-1 % OT SUSP: 3.0000 [drp] | Freq: Two times a day (BID) | OTIC | 0 refills | Status: DC

## 2021-03-04 MED ORDER — AMOXICILLIN-POT CLAVULANATE 875-125 MG PO TABS: 1.0000 | ORAL_TABLET | Freq: Two times a day (BID) | ORAL | 0 refills | Status: AC

## 2021-03-04 NOTE — Progress Notes (Signed)
Established Patient Office Visit     This visit occurred during the SARS-CoV-2 public health emergency.  Safety protocols were in place, including screening questions prior to the visit, additional usage of staff PPE, and extensive cleaning of exam room while observing appropriate contact time as indicated for disinfecting solutions.    CC/Reason for Visit: Left ear pain and drainage  HPI: [name] is a 65 y.o. male who is coming in today for the above mentioned reasons.  For the past 5 to 7 days he has been complaining of decreased hearing out of his left ear.  About 5 days ago he started noticing some drainage and bleeding.  He has been picking at it constantly with a Q-tip.  He has not had any fever, sinus pain or any other URI symptoms.  Past Medical/Surgical History: Past Medical History:  Diagnosis Date   Anemia    Arthritis    "left knee" (11/24/2017)   CAD (coronary artery disease)    CABG 2002   Cancer [facility])    Multiple Myeloma   CHF (congestive heart failure) ([place]) 2021   Chronic kidney disease    Chronic renal insufficiency    Diabetes mellitus without complication ([place])    Dilated cardiomyopathy ([place])    echo in 2009 showed improvement with a normal EF   HTN (hypertension)    Hyperlipemia    Hypertension    Noncompliance    PAD (peripheral artery disease) ([place])    Pneumonia     Past Surgical History:  Procedure Laterality Date   CARDIAC CATHETERIZATION  2002, 2006   CORONARY ARTERY BYPASS GRAFT  2002   x4 DR. VAN TRIGT   IR FLUORO GUIDED NEEDLE PLC ASPIRATION/INJECTION LOC  10/24/2019   TRANSURETHRAL RESECTION OF PROSTATE N/A 01/06/2021   Procedure: CYSTOSCOPY WITH CYSTOGRAM/TRANSURETHRAL RESECTION OF THE PROSTATE (TURP);  Surgeon: Lucas Mallow, MD;  Location: [facility];  Service: Urology;  Laterality: N/A;    Social History:  reports that he has never smoked. He has never used smokeless tobacco. He reports that he does not currently use  alcohol. He reports that he does not currently use drugs.  Allergies: Allergies  Allergen Reactions   Metformin And Related Diarrhea    Family History:  Family History  Problem Relation Age of Onset   Hypertension Father    Diabetes Mother        DIABETIC COMA   Healthy Daughter      Current Outpatient Medications:    Accu-Chek Softclix Lancets lancets, Use Accu Chek softclix lancets to check blood sugar fasting or 2 hours after a meal every other day., Disp: 100 each, Rfl: 2   amLODipine (NORVASC) 5 MG tablet, Take 1 tablet (5 mg total) by mouth daily., Disp: 180 tablet, Rfl: 3   amoxicillin-clavulanate (AUGMENTIN) 875-125 MG tablet, Take 1 tablet by mouth 2 (two) times daily for 7 days., Disp: 14 tablet, Rfl: 0   aspirin EC 81 MG tablet, Take 81 mg by mouth daily. Swallow whole., Disp: , Rfl:    atorvastatin (LIPITOR) 80 MG tablet, Take 1 tablet by mouth once daily (Patient taking differently: Take 80 mg by mouth daily.), Disp: 90 tablet, Rfl: 3   Blood Glucose Monitoring Suppl (ACCU-CHEK GUIDE ME) w/Device KIT, 1 each by Does not apply route., Disp: , Rfl:    carvedilol (COREG) 25 MG tablet, Take 25 mg by mouth 2 (two) times daily with a meal., Disp: , Rfl:  ciprofloxacin-hydrocortisone (CIPRO HC OTIC) OTIC suspension, Place 3 drops into the left ear 2 (two) times daily for 7 days., Disp: 2.1 mL, Rfl: 0   glucose blood (ACCU-CHEK GUIDE) test strip, Use Accu Chek test strips as instructed to check blood sugar fasting or two hours after meals, every other day., Disp: 100 each, Rfl: 2   hydrALAZINE (APRESOLINE) 50 MG tablet, TAKE 1 TABLET BY MOUTH THREE TIMES DAILY (Patient taking differently: Take 50 mg by mouth 3 (three) times daily.), Disp: 270 tablet, Rfl: 3   isosorbide mononitrate (IMDUR) 30 MG 24 hr tablet, Take 1 tablet (30 mg total) by mouth daily., Disp: 90 tablet, Rfl: 2   potassium chloride SA (KLOR-CON) 20 MEQ tablet, Take 1 tablet by mouth once daily (Patient taking  differently: Take 20 mEq by mouth daily.), Disp: 30 tablet, Rfl: 3   prochlorperazine (COMPAZINE) 10 MG tablet, Take 1 tablet (10 mg total) by mouth every 6 (six) hours as needed (Nausea or vomiting)., Disp: 30 tablet, Rfl: 1   repaglinide (PRANDIN) 2 MG tablet, Take 1 tablet (2 mg total) by mouth 3 (three) times daily before meals., Disp: 90 tablet, Rfl: 2   tamsulosin (FLOMAX) 0.4 MG CAPS capsule, Take 1 capsule (0.4 mg total) by mouth daily., Disp: , Rfl:  No current facility-administered medications for this visit.  Facility-Administered Medications Ordered in Other Visits:    sodium chloride flush (NS) 0.9 % injection 10 mL, 10 mL, Intravenous, PRN, Irene Limbo, Cloria [place], MD   sodium chloride flush (NS) 0.9 % injection 10 mL, 10 mL, Intracatheter, Once PRN, Brunetta Genera, MD  Review of Systems:  Constitutional: Denies fever, chills, diaphoresis, appetite change and fatigue.  HEENT: Denies photophobia, eye pain, redness, congestion, sore throat, rhinorrhea, sneezing, mouth sores, trouble swallowing, neck pain, neck stiffness and tinnitus.   Respiratory: Denies SOB, DOE, cough, chest tightness,  and wheezing.   Cardiovascular: Denies chest pain, palpitations and leg swelling.  Gastrointestinal: Denies nausea, vomiting, abdominal pain, diarrhea, constipation, blood in stool and abdominal distention.  Genitourinary: Denies dysuria, urgency, frequency, hematuria, flank pain and difficulty urinating.  Endocrine: Denies: hot or cold intolerance, sweats, changes in hair or nails, polyuria, polydipsia. Musculoskeletal: Denies myalgias, back pain, joint swelling, arthralgias and gait problem.  Skin: Denies pallor, rash and wound.  Neurological: Denies dizziness, seizures, syncope, weakness, light-headedness, numbness and headaches.  Hematological: Denies adenopathy. Easy bruising, personal or family bleeding history  Psychiatric/Behavioral: Denies suicidal ideation, mood changes, confusion,  nervousness, sleep disturbance and agitation    Physical Exam: Vitals:   03/04/21 1551  BP: (!) 180/90  Pulse: 84  Temp: 99.6 F (37.6 C)  TempSrc: Oral  SpO2: 96%  Weight: 172 lb 9.6 oz (78.3 kg)    Body mass index is 25.49 kg/m.   Constitutional: NAD, calm, comfortable Eyes: PERRL, lids and conjunctivae normal, wears corrective lenses ENMT: Mucous membranes are moist. Posterior pharynx clear of any exudate or lesions. Normal dentition. Tympanic membrane is pearly white, no erythema or bulging on the right, left has some visible bleeding from the pinna, some serosanguineous drainage, significant swelling of the tragus and pinna as well as outer ear canals. Neck: normal, supple, no masses, no thyromegaly Respiratory: clear to auscultation bilaterally, no wheezing, no crackles. Normal respiratory effort. No accessory muscle use.  Cardiovascular: Regular rate and rhythm, no murmurs / rubs / gallops. No extremity edema. 2+ pedal pulses. No carotid bruits.  Abdomen: no tenderness, no masses palpated. No hepatosplenomegaly. Bowel sounds positive.  Musculoskeletal: no  clubbing / cyanosis. No joint deformity upper and lower extremities. Good ROM, no contractures. Normal muscle tone.  Skin: no rashes, lesions, ulcers. No induration Neurologic: CN 2-12 grossly intact. Sensation intact, DTR normal. Strength 5/5 in all 4.  Psychiatric: Normal judgment and insight. Alert and oriented x 3. Normal mood.    Impression and Plan:  Non-recurrent acute suppurative otitis media of left ear without spontaneous rupture of tympanic membrane  Other infective acute otitis externa of left ear  -Etiology not entirely clear, he has not been swimming, this does have the appearance of an otitis externa and possibly otitis media. -Given significant amount of edema, erythema and drainage, I believe it is acceptable to do both oral and topical antibiotics.  He will be prescribed oral Augmentin and Cipro  otic. -I have had one of my clinic colleagues look at his ear as well and we agree on this treatment. -He will return in 2 weeks for follow-up.  Time spent: 31 minutes reviewing chart, interviewing and examining patient, formulating plan of care.   Patient Instructions  -Nice seeing you today!!  -Start augmentin 1 tablet twice daily for 7 days.  -Start cipro ear drops: 3 drops in the left twice daily for 7 days.  -Please schedule follow up in 2 weeks.    Lelon Frohlich, MD [facility]

## 2021-03-04 NOTE — Patient Instructions (Signed)
-  Nice seeing you today!!  -Start augmentin 1 tablet twice daily for 7 days.  -Start cipro ear drops: 3 drops in the left twice daily for 7 days.  -Please schedule follow up in 2 weeks.

## 2021-03-05 ENCOUNTER — Telehealth: Payer: Self-pay

## 2021-03-05 NOTE — Telephone Encounter (Signed)
Patient called stating he was seen on 8/23 and the medication prescribed insurance would not cover the cost of Rx patient would like another Rx sent to the pharmacy

## 2021-03-05 NOTE — Telephone Encounter (Signed)
Left message on machine for patient to return our call 

## 2021-03-06 ENCOUNTER — Encounter: Payer: Self-pay | Admitting: Hematology

## 2021-03-06 ENCOUNTER — Other Ambulatory Visit: Payer: Self-pay | Admitting: Internal Medicine

## 2021-03-06 DIAGNOSIS — H60392 Other infective otitis externa, left ear: Secondary | ICD-10-CM

## 2021-03-06 MED ORDER — OFLOXACIN 0.3 % OT SOLN: 10.0000 [drp] | Freq: Every day | OTIC | 0 refills | Status: DC

## 2021-03-06 NOTE — Telephone Encounter (Signed)
Left detailed message on machine for patient to let him know a prescription was sent to his pharmacy.

## 2021-03-06 NOTE — Telephone Encounter (Signed)
Patient returned call and was informed Rx was sent to pharmacy

## 2021-03-06 NOTE — Telephone Encounter (Signed)
Spoke with pharmacist Nicole [facility])  opinions are:  Neo Poly ear drops Ofloxacin ear drops Can try Cipro eye drops   Please advise

## 2021-03-06 NOTE — Telephone Encounter (Signed)
Spoke with wife and the ear drops was not covered by patient's insurance.

## 2021-03-08 ENCOUNTER — Other Ambulatory Visit: Payer: Self-pay

## 2021-03-08 ENCOUNTER — Emergency Department (HOSPITAL_COMMUNITY)
Admission: EM | Admit: 2021-03-08 | Discharge: 2021-03-08 | Disposition: A | Discharge status: Home / Self Care | Payer: Medicare Other | Attending: Emergency Medicine | Admitting: Emergency Medicine

## 2021-03-08 ENCOUNTER — Emergency Department (HOSPITAL_COMMUNITY): Payer: Medicare Other

## 2021-03-08 ENCOUNTER — Encounter (HOSPITAL_COMMUNITY): Payer: Self-pay

## 2021-03-08 DIAGNOSIS — N189 Chronic kidney disease, unspecified: Secondary | ICD-10-CM | POA: Insufficient documentation

## 2021-03-08 DIAGNOSIS — E1122 Type 2 diabetes mellitus with diabetic chronic kidney disease: Secondary | ICD-10-CM | POA: Diagnosis not present

## 2021-03-08 DIAGNOSIS — Z79899 Other long term (current) drug therapy: Secondary | ICD-10-CM | POA: Insufficient documentation

## 2021-03-08 DIAGNOSIS — I2510 Atherosclerotic heart disease of native coronary artery without angina pectoris: Secondary | ICD-10-CM | POA: Insufficient documentation

## 2021-03-08 DIAGNOSIS — Z951 Presence of aortocoronary bypass graft: Secondary | ICD-10-CM | POA: Insufficient documentation

## 2021-03-08 DIAGNOSIS — G510 Bell's palsy: Secondary | ICD-10-CM | POA: Diagnosis not present

## 2021-03-08 DIAGNOSIS — Z7982 Long term (current) use of aspirin: Secondary | ICD-10-CM | POA: Diagnosis not present

## 2021-03-08 DIAGNOSIS — I5043 Acute on chronic combined systolic (congestive) and diastolic (congestive) heart failure: Secondary | ICD-10-CM | POA: Insufficient documentation

## 2021-03-08 DIAGNOSIS — Z7984 Long term (current) use of oral hypoglycemic drugs: Secondary | ICD-10-CM | POA: Insufficient documentation

## 2021-03-08 DIAGNOSIS — H60312 Diffuse otitis externa, left ear: Secondary | ICD-10-CM | POA: Diagnosis not present

## 2021-03-08 DIAGNOSIS — R42 Dizziness and giddiness: Secondary | ICD-10-CM | POA: Insufficient documentation

## 2021-03-08 DIAGNOSIS — Z85820 Personal history of malignant melanoma of skin: Secondary | ICD-10-CM | POA: Diagnosis not present

## 2021-03-08 DIAGNOSIS — I130 Hypertensive heart and chronic kidney disease with heart failure and stage 1 through stage 4 chronic kidney disease, or unspecified chronic kidney disease: Secondary | ICD-10-CM | POA: Diagnosis not present

## 2021-03-08 DIAGNOSIS — R29810 Facial weakness: Secondary | ICD-10-CM | POA: Diagnosis present

## 2021-03-08 LAB — CBC WITH DIFFERENTIAL/PLATELET
Abs Immature Granulocytes: 0.05 10*3/uL (ref 0.00–0.07)
Basophils Absolute: 0 10*3/uL (ref 0.0–0.1)
Basophils Relative: 0 %
Eosinophils Absolute: 0.1 10*3/uL (ref 0.0–0.5)
Eosinophils Relative: 1 %
HCT: 42.9 % (ref 39.0–52.0)
Hemoglobin: 14.1 g/dL (ref 13.0–17.0)
Immature Granulocytes: 1 %
Lymphocytes Relative: 21 %
Lymphs Abs: 1.5 10*3/uL (ref 0.7–4.0)
MCH: 27.5 pg (ref 26.0–34.0)
MCHC: 32.9 g/dL (ref 30.0–36.0)
MCV: 83.6 fL (ref 80.0–100.0)
Monocytes Absolute: 0.8 10*3/uL (ref 0.1–1.0)
Monocytes Relative: 10 %
Neutro Abs: 4.9 10*3/uL (ref 1.7–7.7)
Neutrophils Relative %: 67 %
Platelets: 227 10*3/uL (ref 150–400)
RBC: 5.13 MIL/uL (ref 4.22–5.81)
RDW: 13.8 % (ref 11.5–15.5)
WBC: 7.3 10*3/uL (ref 4.0–10.5)
nRBC: 0 % (ref 0.0–0.2)

## 2021-03-08 LAB — COMPREHENSIVE METABOLIC PANEL
ALT: 11 U/L (ref 0–44)
AST: 15 U/L (ref 15–41)
Albumin: 4 g/dL (ref 3.5–5.0)
Alkaline Phosphatase: 111 U/L (ref 38–126)
Anion gap: 12 (ref 5–15)
BUN: 31 mg/dL — ABNORMAL HIGH (ref 8–23)
CO2: 22 mmol/L (ref 22–32)
Calcium: 9.4 mg/dL (ref 8.9–10.3)
Chloride: 106 mmol/L (ref 98–111)
Creatinine, Ser: 1.69 mg/dL — ABNORMAL HIGH (ref 0.61–1.24)
GFR, Estimated: 44 mL/min — ABNORMAL LOW (ref >60)
Glucose, Bld: 168 mg/dL — ABNORMAL HIGH (ref 70–99)
Potassium: 3.7 mmol/L (ref 3.5–5.1)
Sodium: 140 mmol/L (ref 135–145)
Total Bilirubin: 1.3 mg/dL — ABNORMAL HIGH (ref 0.3–1.2)
Total Protein: 7.6 g/dL (ref 6.5–8.1)

## 2021-03-08 LAB — CBG MONITORING, ED: Glucose-Capillary: 172 mg/dL — ABNORMAL HIGH (ref 70–99)

## 2021-03-08 LAB — LACTIC ACID, PLASMA: Lactic Acid, Venous: 1.2 mmol/L (ref 0.5–1.9)

## 2021-03-08 MED ORDER — CIPROFLOXACIN HCL 500 MG PO TABS: 500.0000 mg | ORAL_TABLET | Freq: Two times a day (BID) | ORAL | 0 refills | Status: DC

## 2021-03-08 MED ORDER — METHYLPREDNISOLONE 4 MG PO TBPK: ORAL_TABLET | ORAL | 0 refills | Status: DC

## 2021-03-08 MED ORDER — CIPROFLOXACIN IN D5W 400 MG/200ML IV SOLN
400.0000 mg | Freq: Once | INTRAVENOUS | Status: AC
  Administered 2021-03-08: 400 mg via INTRAVENOUS
  Filled 2021-03-08: qty 200

## 2021-03-08 MED ORDER — MECLIZINE HCL 25 MG PO TABS
25.0000 mg | ORAL_TABLET | Freq: Once | ORAL | Status: AC
  Administered 2021-03-08: 25 mg via ORAL
  Filled 2021-03-08: qty 1

## 2021-03-08 MED ORDER — MECLIZINE HCL 12.5 MG PO TABS: 12.5000 mg | ORAL_TABLET | Freq: Three times a day (TID) | ORAL | 0 refills | Status: DC | PRN

## 2021-03-08 MED ORDER — CIPROFLOXACIN-DEXAMETHASONE 0.3-0.1 % OT SUSP
4.0000 [drp] | Freq: Once | OTIC | Status: AC
  Administered 2021-03-08: 4 [drp] via OTIC
  Filled 2021-03-08: qty 7.5

## 2021-03-08 MED ORDER — HYPROMELLOSE (GONIOSCOPIC) 2.5 % OP SOLN: 2.0000 [drp] | OPHTHALMIC | 2 refills | Status: AC | PRN

## 2021-03-08 MED ORDER — PIPERACILLIN-TAZOBACTAM 3.375 G IVPB 30 MIN
3.3750 g | Freq: Once | INTRAVENOUS | Status: AC
  Administered 2021-03-08: 3.375 g via INTRAVENOUS
  Filled 2021-03-08: qty 50

## 2021-03-08 NOTE — ED Triage Notes (Addendum)
Pt reports waking up this morning around 0545 with left side facial droop, dizziness and "talking out right side of face". Went to bed last night around 2300.   Also sts he is being treated for left ear infection since this past Thursday.

## 2021-03-08 NOTE — ED Notes (Signed)
RN sent light green, lavender, 2 golds, light blue, dark green tubes to lab.

## 2021-03-08 NOTE — Progress Notes (Signed)
A consult was received from an ED physician for Zosyn per pharmacy dosing.  The patient's profile has been reviewed for ht/wt/allergies/indication/available labs.   A one time order has been placed for Zosyn 3.375 g iv once over 30 minutes.  Further antibiotics/pharmacy consults should be ordered by admitting physician if indicated.                       Thank you, Napoleon Form 03/08/2021  7:46 AM

## 2021-03-08 NOTE — ED Provider Notes (Addendum)
I personally evaluated the patient during the encounter and completed a history, physical, procedures, medical decision making to contribute to the overall care of the patient and decision making for the patient briefly, the patient is a 65 y.o. male who presents the ED with left-sided facial droop, left ear infection.  Normal vitals except for mild hypertension.  No fever.  Has had left ear infection for the past week.  Started on Augmentin and ofloxacin eardrops 2 days ago.  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  Went to bed normal last night and woke up this morning with left-sided facial droop.  Neurological exam is normal otherwise but has exam consistent with a Bell's palsy.  Suspect that this is from his otitis externa.  He is a diabetic.  He does have high cholesterol and hypertension.  We will get labs and CT scan of head and mastoid.  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  We will give him IV ciprofloxacin and IV Zosyn.  Anticipate admission but will talk to ENT as get input. Please see PA note for further results, evaluation, disposition of the patient.  Talked with ENT and will change antibiotics and ENT will f/u closely this week. Overall, patient with complicated malignant otitis externa.  This chart was dictated using voice recognition software.  Despite best efforts to proofread,  errors can occur which can change the documentation meaning.    EKG Interpretation  Date/Time:  Saturday March 08 2021 07:52:53 EDT Ventricular Rate:  71 PR Interval:  155 QRS Duration: 79 QT Interval:  416 QTC Calculation: 453 R Axis:   -38 Text Interpretation: Sinus rhythm Left axis deviation Anteroseptal infarct, old Confirmed by Ronnald Nian, Ananda Caya [id number]) on 03/08/2021 8:23:07 AM            Lennice Sites, DO 03/08/21  [place], [place], DO 03/08/21 [phone]

## 2021-03-08 NOTE — ED Provider Notes (Signed)
[facility] DEPT Provider Note   CSN: [id number] Arrival date & time: 03/08/21  6144     History Chief Complaint  Patient presents with   Facial Droop    [name] is a 65 y.o. male with a past medical history of CAD, PAD, hypertension, hyperlipidemia, dilated cardiomyopathy, CKD, diabetes, CHF, multiple myeloma who presents emergency department with facial droop.  Patient had onset of draining left ear starting 1 week ago.  2 days ago the patient went to his primary care physician and was diagnosed with otitis externa and started on Augmentin and Cipro drops.  He states that the same day he began having severe ataxia and disequilibrium.  His wife also reports that this morning he was doing much better as he was unable to walk at all prior to today but was able to walk with assistance today.  He awoke this morning with left facial droop.  He denies any ear pain, fevers, chills.  He denies vertigo or unilateral weakness.  HPI     Past Medical History:  Diagnosis Date   Anemia    Arthritis    "left knee" (11/24/2017)   CAD (coronary artery disease)    CABG 2002   Cancer [facility])    Multiple Myeloma   CHF (congestive heart failure) ([place]) 2021   Chronic kidney disease    Chronic renal insufficiency    Diabetes mellitus without complication ([place])    Dilated cardiomyopathy ([place])    echo in 2009 showed improvement with a normal EF   HTN (hypertension)    Hyperlipemia    Hypertension    Noncompliance    PAD (peripheral artery disease) ([place])    Pneumonia     Patient Active Problem List   Diagnosis Date Noted   S/P TURP 01/06/2021   Pressure injury of skin 10/09/2020   Bladder mass    Failure to thrive in adult 10/07/2020   Flu vaccine need 06/04/2020   Iron deficiency anemia 06/04/2020   Rheumatoid arthritis involving multiple sites ([place]) 04/17/2020   Aspiration pneumonia ([place]) 03/13/2020   Sepsis ([place]) 03/13/2020   Pulmonary embolism  ([place]) 03/13/2020   AKI (acute kidney injury) ([place]) 03/13/2020   Anemia 03/13/2020   Multiple myeloma ([place]) 03/13/2020   Hypertension 03/13/2020   Acute hypoxemic respiratory failure ([place]) 03/13/2020   Multiple myeloma ([place]) 11/06/2019   Counseling regarding advance care planning and goals of care 11/06/2019   Uncontrolled type 2 diabetes mellitus with hyperglycemia ([place]) 09/19/2018   Acute on chronic combined systolic and diastolic CHF (congestive heart failure) ([place]) 11/26/2017   Hyperlipemia 01/07/2011   CAD (coronary artery disease)    Dilated cardiomyopathy ([facility])    HTN (hypertension)    PAD (peripheral artery disease) ([place])    Chronic renal insufficiency     Past Surgical History:  Procedure Laterality Date   CARDIAC CATHETERIZATION  2002, 2006   CORONARY ARTERY BYPASS GRAFT  2002   x4 DR. VAN TRIGT   IR FLUORO GUIDED NEEDLE PLC ASPIRATION/INJECTION LOC  10/24/2019   TRANSURETHRAL RESECTION OF PROSTATE N/A 01/06/2021   Procedure: CYSTOSCOPY WITH CYSTOGRAM/TRANSURETHRAL RESECTION OF THE PROSTATE (TURP);  Surgeon: Lucas Mallow, MD;  Location: [facility];  Service: Urology;  Laterality: N/A;       Family History  Problem Relation Age of Onset   Hypertension Father    Diabetes Mother        DIABETIC COMA   Healthy Daughter     Social History  Tobacco Use   Smoking status: Never   Smokeless tobacco: Never  Vaping Use   Vaping Use: Never used  Substance Use Topics   Alcohol use: Not Currently   Drug use: Not Currently    Home Medications Prior to Admission medications   Medication Sig Start Date End Date Taking? Authorizing Provider  ciprofloxacin (CIPRO) 500 MG tablet Take 1 tablet (500 mg total) by mouth every 12 (twelve) hours. 03/08/21  Yes Ademola Vert, PA-C  hydroxypropyl methylcellulose / hypromellose (ISOPTO TEARS / GONIOVISC) 2.5 % ophthalmic solution Place 2 drops into the left eye as needed for dry eyes. 03/08/21  Yes Arthor Captain, PA-C  meclizine  (ANTIVERT) 12.5 MG tablet Take 1-2 tablets (12.5-25 mg total) by mouth 3 (three) times daily as needed for dizziness. 03/08/21  Yes Arthor Captain, PA-C  methylPREDNISolone (MEDROL DOSEPAK) 4 MG TBPK tablet Use as directed 03/08/21  Yes Desirai Traxler, PA-C  ofloxacin (FLOXIN) 0.3 % OTIC solution Place 10 drops into the left ear daily. 03/06/21   [name], [name], MD  Accu-Chek Softclix Lancets lancets Use Accu Chek softclix lancets to check blood sugar fasting or 2 hours after a meal every other day. 11/23/19   [name], MD  amLODipine (NORVASC) 5 MG tablet Take 1 tablet (5 mg total) by mouth daily. 12/27/20 03/27/21  [place], Peter M, MD  amoxicillin-clavulanate (AUGMENTIN) 875-125 MG tablet Take 1 tablet by mouth 2 (two) times daily for 7 days. 03/04/21 03/11/21  Henderson Cloud, MD  aspirin EC 81 MG tablet Take 81 mg by mouth daily. Swallow whole.    [provider]  atorvastatin (LIPITOR) 80 MG tablet Take 1 tablet by mouth once daily Patient taking differently: Take 80 mg by mouth daily. 06/25/20   [place], Peter M, MD  Blood Glucose Monitoring Suppl (ACCU-CHEK GUIDE ME) w/Device KIT 1 each by Does not apply route.    [provider]  carvedilol (COREG) 25 MG tablet Take 25 mg by mouth 2 (two) times daily with a meal.    [provider]  glucose blood (ACCU-CHEK GUIDE) test strip Use Accu Chek test strips as instructed to check blood sugar fasting or two hours after meals, every other day. 08/18/19   [name], MD  hydrALAZINE (APRESOLINE) 50 MG tablet TAKE 1 TABLET BY MOUTH THREE TIMES DAILY Patient taking differently: Take 50 mg by mouth 3 (three) times daily. 06/25/20   [place], Peter M, MD  isosorbide mononitrate (IMDUR) 30 MG 24 hr tablet Take 1 tablet (30 mg total) by mouth daily. 01/08/21   [place], Peter M, MD  potassium chloride SA (KLOR-CON) 20 MEQ tablet Take 1 tablet by mouth once daily Patient taking differently: Take 20 mEq by mouth daily.  05/13/20   [name], MD  prochlorperazine (COMPAZINE) 10 MG tablet Take 1 tablet (10 mg total) by mouth every 6 (six) hours as needed (Nausea or vomiting). 11/06/19   Johney Maine, MD  repaglinide (PRANDIN) 2 MG tablet Take 1 tablet (2 mg total) by mouth 3 (three) times daily before meals. 02/13/21   [name], MD  tamsulosin (FLOMAX) 0.4 MG CAPS capsule Take 1 capsule (0.4 mg total) by mouth daily. 10/12/20   Tyrone Nine, MD    Allergies    Metformin and related  Review of Systems   Review of Systems Ten systems reviewed and are negative for acute change, except as noted in the HPI.   Physical Exam Updated Vital Signs BP 137/71  Pulse (!) 56   Temp 98.6 F (37 C) (Oral)   Resp (!) 27   Ht 5' 9.5" (1.765 m)   Wt 78 kg   SpO2 99%   BMI 25.04 kg/m   Physical Exam Vitals and nursing note reviewed.  Constitutional:      General: He is not in acute distress.    Appearance: He is well-developed. He is obese. He is not ill-appearing or diaphoretic.  HENT:     Head: Normocephalic and atraumatic.     Right Ear: Tympanic membrane normal. No mastoid tenderness. Tympanic membrane is not injected.     Left Ear: Decreased hearing noted. Drainage and swelling present. No mastoid tenderness.     Ears:     Comments: L ear with marked swelling, Several small pustules noted on the helix, Crusted drainage noted anteriorly. Canal is markedly swollen and full of fluid and debris. Unable to visualize the TM. No mastoid tenderness. Eyes:     General: No visual field deficit or scleral icterus.    Extraocular Movements: Extraocular movements intact.     Conjunctiva/sclera: Conjunctivae normal.     Pupils: Pupils are equal, round, and reactive to light.     Comments: No nystagmus  Cardiovascular:     Rate and Rhythm: Normal rate and regular rhythm.     Heart sounds: Normal heart sounds.  Pulmonary:     Effort: Pulmonary effort is normal. No respiratory distress.     Breath sounds:  Normal breath sounds.  Abdominal:     Palpations: Abdomen is soft.     Tenderness: There is no abdominal tenderness.  Musculoskeletal:     Cervical back: Normal range of motion and neck supple.  Skin:    General: Skin is warm and dry.  Neurological:     Mental Status: He is alert and oriented to person, place, and time.     GCS: GCS eye subscore is 4. GCS verbal subscore is 5. GCS motor subscore is 6.     Cranial Nerves: Cranial nerve deficit and facial asymmetry present.     Sensory: Sensation is intact. No sensory deficit.     Motor: Motor function is intact. No weakness, tremor, abnormal muscle tone or pronator drift.     Coordination: Romberg sign positive. Coordination normal. Finger-Nose-Finger Test normal.     Gait: Gait abnormal.     Comments: Left facial droop noted with flattening of the Left forehead. Unable to shut the left eyelid. Gait- patient able to ambulate with assistance- must hold on to something. Balance is poor with standing alone. Gait is shuffeling.  Psychiatric:        Behavior: Behavior normal.    ED Results / Procedures / Treatments   Labs (all labs ordered are listed, but only abnormal results are displayed) Labs Reviewed  COMPREHENSIVE METABOLIC PANEL - Abnormal; Notable for the following components:      Result Value   Glucose, Bld 168 (*)    BUN 31 (*)    Creatinine, Ser 1.69 (*)    Total Bilirubin 1.3 (*)    GFR, Estimated 44 (*)    All other components within normal limits  CBG MONITORING, ED - Abnormal; Notable for the following components:   Glucose-Capillary 172 (*)    All other components within normal limits  CULTURE, BLOOD (ROUTINE X 2)  CULTURE, BLOOD (ROUTINE X 2)  CBC WITH DIFFERENTIAL/PLATELET  LACTIC ACID, PLASMA    EKG EKG Interpretation  Date/Time:  Saturday March 08 2021 07:52:53 EDT Ventricular Rate:  71 PR Interval:  155 QRS Duration: 79 QT Interval:  416 QTC Calculation: 453 R Axis:   -38 Text  Interpretation: Sinus rhythm Left axis deviation Anteroseptal infarct, old Confirmed by Lennice Sites [id number]) on 03/08/2021 8:23:07 AM  Radiology CT HEAD WO CONTRAST (5MM)  Result Date: 03/08/2021 CLINICAL DATA:  Neurologic deficit. EXAM: CT HEAD WITHOUT CONTRAST TECHNIQUE: Contiguous axial images were obtained from the base of the skull through the vertex without intravenous contrast. COMPARISON:  03/19/2005 FINDINGS: Brain: No evidence of acute infarction, hemorrhage, hydrocephalus, extra-axial collection or mass lesion/mass effect. There is mild diffuse low-attenuation within the subcortical and periventricular white matter compatible with chronic microvascular disease. Chronic left basal ganglia lacunar infarct. Prominence of the sulci and ventricles compatible with brain atrophy. Vascular: No hyperdense vessel or unexpected calcification. Skull: Normal. Negative for fracture or focal lesion. Sinuses/Orbits: No acute finding. Other: None IMPRESSION: 1. No acute intracranial abnormalities. 2. 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 Electronically Signed   By: Kerby Moors M.D.   On: 03/08/2021 07:58    Procedures Procedures   Medications Ordered in ED Medications  ciprofloxacin (CIPRO) IVPB 400 mg (0 mg Intravenous Stopped 03/08/21 1016)  piperacillin-tazobactam (ZOSYN) IVPB 3.375 g (0 g Intravenous Stopped 03/08/21 0831)  meclizine (ANTIVERT) tablet 25 mg (25 mg Oral Given 03/08/21 0831)  ciprofloxacin-dexamethasone (CIPRODEX) 0.3-0.1 % OTIC (EAR) suspension 4 drop (4 drops Left EAR Given 03/08/21 0941)    ED Course  I have reviewed the triage vital signs and the nursing notes.  Pertinent labs & imaging results that were available during my care of the patient were reviewed by me and considered in my medical decision making (see chart for details).  Clinical Course as of 03/08/21 1522  Sat Mar 08, 2021  4854 Case discussed with Dr. Redmond Baseman. Recommends ear wick, oral cipro,  ciprodex, and steroid.  Recheck in the office in on Tuesday. [AH]    Clinical Course User Index [AH] Margarita Mail, PA-C   MDM Rules/Calculators/A&P                            3:22 PM BP 137/71   Pulse (!) 56   Temp 98.6 F (37 C) (Oral)   Resp (!) 27   Ht 5' 9.5" (1.765 m)   Wt 78 kg   SpO2 99%   BMI 25.04 kg/m  Patient here with apparent Bells palsy, disequilibrium, and ataxia. I suspect this is all secondary to what appears to be a malignant otitis externa. I have placed the patient on IV abx with cipro and zosyn. Low suspicion for CVA. CT ordered to evaluate for mastoiditis/ bony erosion. Suspect patient will need admission.  Patient work-up has returned.  I ordered and reviewed labs that included CBC, lactate both within normal limits, CMP shows renal insufficiency, CBG with elevated blood glucose at 172.  I ordered and reviewed a head CT which shows no acute abnormalities including no bony erosion or osteomyelitis with the ER.  EKG shows sinus rhythm at a rate of 71. Case discussed with Dr. Redmond Baseman and recommendations as stated in ED course above.  Patient will be discharged with those recommendations. He appears otherwise appropriate for discharge at this time and is ambulatory with assistance. Final Clinical Impression(s) / ED Diagnoses Final diagnoses:  Bell's palsy  Acute diffuse otitis externa of left ear  Vertigo    Rx / DC Orders ED  Discharge Orders          Ordered    methylPREDNISolone (MEDROL DOSEPAK) 4 MG TBPK tablet        03/08/21 1031    ciprofloxacin (CIPRO) 500 MG tablet  Every 12 hours        03/08/21 1031    hydroxypropyl methylcellulose / hypromellose (ISOPTO TEARS / GONIOVISC) 2.5 % ophthalmic solution  As needed        03/08/21 1031    meclizine (ANTIVERT) 12.5 MG tablet  3 times daily PRN        03/08/21 1031             Margarita Mail, PA-C 03/08/21 [place], [place], DO 03/09/21 0732

## 2021-03-08 NOTE — Discharge Instructions (Addendum)
Ciprodex Ear drops- 4  drops of ciprofloxacin 0.3%/dexamethasone 0.1% into the left ear twice a day for 7 days. You will need to apply paper tape to your eye at night and use eye drops at least every 3 hours to keep the left eye moist. Please call Dr. Redmond Baseman Monday morning to get a follow up appointment on Tuesday. Follow up with Dr. Jerilee Hoh to check your sugars.  Get help right away if: You have NEW weakness or numbness in a part of your body other than your face. You have trouble swallowing. You develop neck pain or stiffness. You dizziness WORSENS or you develop shortness of breath.

## 2021-03-13 ENCOUNTER — Other Ambulatory Visit: Payer: Self-pay

## 2021-03-13 LAB — CULTURE, BLOOD (ROUTINE X 2)
Culture: NO GROWTH
Culture: NO GROWTH
Special Requests: ADEQUATE

## 2021-03-14 ENCOUNTER — Ambulatory Visit (INDEPENDENT_AMBULATORY_CARE_PROVIDER_SITE_OTHER): Payer: Medicare Other | Admitting: Internal Medicine

## 2021-03-14 ENCOUNTER — Encounter: Payer: Self-pay | Admitting: Internal Medicine

## 2021-03-14 VITALS — BP 140/90 | HR 61 | Temp 98.2°F | Wt 167.0 lb

## 2021-03-14 DIAGNOSIS — I25810 Atherosclerosis of coronary artery bypass graft(s) without angina pectoris: Secondary | ICD-10-CM | POA: Diagnosis not present

## 2021-03-14 DIAGNOSIS — G510 Bell's palsy: Secondary | ICD-10-CM

## 2021-03-14 DIAGNOSIS — Z09 Encounter for follow-up examination after completed treatment for conditions other than malignant neoplasm: Secondary | ICD-10-CM

## 2021-03-14 DIAGNOSIS — H6022 Malignant otitis externa, left ear: Secondary | ICD-10-CM | POA: Diagnosis not present

## 2021-03-14 LAB — HM DIABETES EYE EXAM

## 2021-03-14 MED ORDER — CIPROFLOXACIN-DEXAMETHASONE 0.3-0.1 % OT SUSP: 4.0000 [drp] | Freq: Two times a day (BID) | OTIC | 0 refills | Status: DC

## 2021-03-14 MED ORDER — CIPROFLOXACIN HCL 500 MG PO TABS: 500.0000 mg | ORAL_TABLET | Freq: Two times a day (BID) | ORAL | 0 refills | Status: AC

## 2021-03-14 NOTE — Progress Notes (Signed)
Established Patient Office Visit     This visit occurred during the SARS-CoV-2 public health emergency.  Safety protocols were in place, including screening questions prior to the visit, additional usage of staff PPE, and extensive cleaning of exam room while observing appropriate contact time as indicated for disinfecting solutions.    CC/Reason for Visit: Hospital follow-up  HPI: [name] is a 65 y.o. male who is coming in today for the above mentioned reasons.  I saw him in office on August 23.  At that time he was diagnosed with otitis externa, possibly otitis media and was sent home with Augmentin and ofloxacin eardrops.  On August 27 he presented to the emergency department with a Bell's palsy.  Has been feeling unbalanced.  CT scan was performed which was negative (although it appears that what was ordered was a head CT, did not include mastoids, there is a mention of no acute finding of the sinuses and orbits).  They spoke with ENT Dr. Redmond Baseman who recommended a switch to Cipro and Ciprodex otic.  They were asked to follow-up with him outpatient.  The wife called and the soonest he can be seen is on September 21.  It does not appear that a referral to ophthalmology was made.  He is still unable to close his left eye, I am concerned that he might develop a corneal ulcer if not addressed.  Past Medical/Surgical History: Past Medical History:  Diagnosis Date   Anemia    Arthritis    "left knee" (11/24/2017)   CAD (coronary artery disease)    CABG 2002   Cancer [facility])    Multiple Myeloma   CHF (congestive heart failure) ([place]) 2021   Chronic kidney disease    Chronic renal insufficiency    Diabetes mellitus without complication ([place])    Dilated cardiomyopathy ([place])    echo in 2009 showed improvement with a normal EF   HTN (hypertension)    Hyperlipemia    Hypertension    Noncompliance    PAD (peripheral artery disease) ([place])    Pneumonia     Past Surgical History:   Procedure Laterality Date   CARDIAC CATHETERIZATION  2002, 2006   CORONARY ARTERY BYPASS GRAFT  2002   x4 DR. VAN TRIGT   IR FLUORO GUIDED NEEDLE PLC ASPIRATION/INJECTION LOC  10/24/2019   TRANSURETHRAL RESECTION OF PROSTATE N/A 01/06/2021   Procedure: CYSTOSCOPY WITH CYSTOGRAM/TRANSURETHRAL RESECTION OF THE PROSTATE (TURP);  Surgeon: Lucas Mallow, MD;  Location: [facility];  Service: Urology;  Laterality: N/A;    Social History:  reports that he has never smoked. He has never used smokeless tobacco. He reports that he does not currently use alcohol. He reports that he does not currently use drugs.  Allergies: Allergies  Allergen Reactions   Metformin And Related Diarrhea    Family History:  Family History  Problem Relation Age of Onset   Hypertension Father    Diabetes Mother        DIABETIC COMA   Healthy Daughter      Current Outpatient Medications:    Accu-Chek Softclix Lancets lancets, Use Accu Chek softclix lancets to check blood sugar fasting or 2 hours after a meal every other day., Disp: 100 each, Rfl: 2   amLODipine (NORVASC) 5 MG tablet, Take 1 tablet (5 mg total) by mouth daily., Disp: 180 tablet, Rfl: 3   aspirin EC 81 MG tablet, Take 81 mg by mouth daily. Swallow whole., Disp: ,  Rfl:    atorvastatin (LIPITOR) 80 MG tablet, Take 1 tablet by mouth once daily (Patient taking differently: Take 80 mg by mouth daily.), Disp: 90 tablet, Rfl: 3   Blood Glucose Monitoring Suppl (ACCU-CHEK GUIDE ME) w/Device KIT, 1 each by Does not apply route., Disp: , Rfl:    carvedilol (COREG) 25 MG tablet, Take 25 mg by mouth 2 (two) times daily with a meal., Disp: , Rfl:    ciprofloxacin-dexamethasone (CIPRODEX) OTIC suspension, Place 4 drops into the left ear 2 (two) times daily., Disp: 7.5 mL, Rfl: 0   glucose blood (ACCU-CHEK GUIDE) test strip, Use Accu Chek test strips as instructed to check blood sugar fasting or two hours after meals, every other day., Disp: 100 each, Rfl: 2    hydrALAZINE (APRESOLINE) 50 MG tablet, TAKE 1 TABLET BY MOUTH THREE TIMES DAILY (Patient taking differently: Take 50 mg by mouth 3 (three) times daily.), Disp: 270 tablet, Rfl: 3   hydroxypropyl methylcellulose / hypromellose (ISOPTO TEARS / GONIOVISC) 2.5 % ophthalmic solution, Place 2 drops into the left eye as needed for dry eyes., Disp: 15 mL, Rfl: 2   isosorbide mononitrate (IMDUR) 30 MG 24 hr tablet, Take 1 tablet (30 mg total) by mouth daily., Disp: 90 tablet, Rfl: 2   methylPREDNISolone (MEDROL DOSEPAK) 4 MG TBPK tablet, Use as directed, Disp: 21 tablet, Rfl: 0   ofloxacin (FLOXIN) 0.3 % OTIC solution, Place 10 drops into the left ear daily., Disp: 5 mL, Rfl: 0   potassium chloride SA (KLOR-CON) 20 MEQ tablet, Take 1 tablet by mouth once daily (Patient taking differently: Take 20 mEq by mouth daily.), Disp: 30 tablet, Rfl: 3   prochlorperazine (COMPAZINE) 10 MG tablet, Take 1 tablet (10 mg total) by mouth every 6 (six) hours as needed (Nausea or vomiting)., Disp: 30 tablet, Rfl: 1   repaglinide (PRANDIN) 2 MG tablet, Take 1 tablet (2 mg total) by mouth 3 (three) times daily before meals., Disp: 90 tablet, Rfl: 2   tamsulosin (FLOMAX) 0.4 MG CAPS capsule, Take 1 capsule (0.4 mg total) by mouth daily., Disp: , Rfl:    ciprofloxacin (CIPRO) 500 MG tablet, Take 1 tablet (500 mg total) by mouth every 12 (twelve) hours for 7 days., Disp: 14 tablet, Rfl: 0   meclizine (ANTIVERT) 12.5 MG tablet, Take 1-2 tablets (12.5-25 mg total) by mouth 3 (three) times daily as needed for dizziness. (Patient not taking: Reported on 03/14/2021), Disp: 30 tablet, Rfl: 0 No current facility-administered medications for this visit.  Facility-Administered Medications Ordered in Other Visits:    sodium chloride flush (NS) 0.9 % injection 10 mL, 10 mL, Intravenous, PRN, Irene Limbo, Cloria [place], MD   sodium chloride flush (NS) 0.9 % injection 10 mL, 10 mL, Intracatheter, Once PRN, Brunetta Genera, MD  Review of Systems:   Constitutional: Denies fever, chills, diaphoresis, appetite change.  HEENT: Denies  mouth sores, trouble swallowing, neck pain, neck stiffness and tinnitus.   Respiratory: Denies SOB, DOE, cough, chest tightness,  and wheezing.   Cardiovascular: Denies chest pain, palpitations and leg swelling.  Gastrointestinal: Denies nausea, vomiting, abdominal pain, diarrhea, constipation, blood in stool and abdominal distention.  Genitourinary: Denies dysuria, urgency, frequency, hematuria, flank pain and difficulty urinating.  Endocrine: Denies: hot or cold intolerance, sweats, changes in hair or nails, polyuria, polydipsia. Musculoskeletal: Denies myalgias, back pain, joint swelling, arthralgias and gait problem.  Skin: Denies pallor, rash and wound.  Neurological: Denies , seizures, syncope,  numbness and headaches.  Hematological: Denies adenopathy.  Easy bruising, personal or family bleeding history  Psychiatric/Behavioral: Denies suicidal ideation, mood changes, confusion, nervousness, sleep disturbance and agitation    Physical Exam: Vitals:   03/14/21 0820  BP: 140/90  Pulse: 61  Temp: 98.2 F (36.8 C)  TempSrc: Oral  SpO2: 99%  Weight: 167 lb (75.8 kg)    Body mass index is 24.31 kg/m.   Constitutional: NAD, calm, comfortable Eyes: Left-sided Bell's palsy ENMT: Mucous membranes are moist. Posterior pharynx clear of any exudate or lesions.  Unable to visualize tympanic membrane due to edema of the left auditory canal.  Still with significant edema of the outer ear albeit improved from my initial visit on 23 August.   Neck: normal, supple, no masses, no thyromegaly Psychiatric: Normal judgment and insight. Alert and oriented x 3. Normal mood.    Impression and Plan:  ED follow-up Acute malignant otitis externa of left ear Bell's palsy  -I will give him another run of oral Cipro and another prescription for Ciprodex otic solution. -We will notify Dr. Redmond Baseman office to see if he  may be seen sooner and we will place an urgent referral to ophthalmology.  Time spent: 32 minutes reviewing chart, interviewing patient and wife, examining patient and formulating plan of care.   Patient Instructions  -Nice seeing you today!!  -Take cipro 500 mg twice daily for 7 days.  -Cipro dex ear drops: 4 drops in the left ear twice daily.  -Will place referral for eye doctor and see if ENT can see you sooner.    Lelon Frohlich, MD [facility]

## 2021-03-14 NOTE — Patient Instructions (Signed)
-  Nice seeing you today!!  -Take cipro 500 mg twice daily for 7 days.  -Cipro dex ear drops: 4 drops in the left ear twice daily.  -Will place referral for eye doctor and see if ENT can see you sooner.

## 2021-03-19 ENCOUNTER — Ambulatory Visit: Payer: Medicare Other | Admitting: Internal Medicine

## 2021-03-19 NOTE — Progress Notes (Signed)
Office Visit Note  Patient: [name]             Date of Birth: 1955/12/29           MRN: [id number]             PCP: Isaac Bliss, Rayford Halsted, MD Referring: Isaac Bliss, [facility]* Visit Date: 04/02/2021 Occupation: _0 @  Subjective:  Other (Right knee pain- patient denies fall or injury. Onset of pain was a few weeks ago. )   History of Present Illness: [name] is a 65 y.o. male returns today after his last visit in February 2022.  He has severe erosive rheumatoid arthritis with nodulosis.  At the last visit he was on chemotherapy for multiple myeloma and did not require any treatment.  We also discussed immunosuppressive therapy which she declined at the time.  He states he finished chemotherapy and has been in remission.  He has been experiencing pain and discomfort in his right knee for the last 4 days.  None of the other joints are painful.  He has been using a walker due to difficulty walking.  None of the other joints are painful.  Activities of Daily Living:  Patient reports morning stiffness for 0 minutes.   Patient Denies nocturnal pain.  Difficulty dressing/grooming: Denies Difficulty climbing stairs: Reports Difficulty getting out of chair: Reports Difficulty using hands for taps, buttons, cutlery, and/or writing: Denies  Review of Systems  Constitutional:  Negative for fatigue.  HENT:  Negative for mouth sores, mouth dryness and nose dryness.   Eyes:  Positive for dryness. Negative for pain and itching.  Respiratory:  Negative for shortness of breath and difficulty breathing.   Cardiovascular:  Negative for chest pain and palpitations.  Gastrointestinal:  Negative for blood in stool, constipation and diarrhea.  Endocrine: Negative for increased urination.  Genitourinary:  Negative for difficulty urinating.  Musculoskeletal:  Positive for joint pain, joint pain and joint swelling. Negative for myalgias, morning stiffness, muscle  tenderness and myalgias.  Skin:  Negative for color change, rash, redness and sensitivity to sunlight.  Allergic/Immunologic: Negative for susceptible to infections.  Neurological:  Negative for dizziness, numbness, headaches, memory loss and weakness.  Hematological:  Negative for bruising/bleeding tendency and swollen glands.  Psychiatric/Behavioral:  Negative for depressed mood and confusion. The patient is not nervous/anxious.    PMFS History:  Patient Active Problem List   Diagnosis Date Noted   S/P TURP 01/06/2021   Pressure injury of skin 10/09/2020   Bladder mass    Failure to thrive in adult 10/07/2020   Flu vaccine need 06/04/2020   Iron deficiency anemia 06/04/2020   Rheumatoid arthritis involving multiple sites ([place]) 04/17/2020   Aspiration pneumonia ([place]) 03/13/2020   Sepsis ([place]) 03/13/2020   Pulmonary embolism ([place]) 03/13/2020   AKI (acute kidney injury) ([place]) 03/13/2020   Anemia 03/13/2020   Multiple myeloma ([place]) 03/13/2020   Hypertension 03/13/2020   Acute hypoxemic respiratory failure ([place]) 03/13/2020   Multiple myeloma ([place]) 11/06/2019   Counseling regarding advance care planning and goals of care 11/06/2019   Uncontrolled type 2 diabetes mellitus with hyperglycemia ([place]) 09/19/2018   Acute on chronic combined systolic and diastolic CHF (congestive heart failure) ([place]) 11/26/2017   Hyperlipemia 01/07/2011   CAD (coronary artery disease)    Dilated cardiomyopathy (HCC)    HTN (hypertension)    PAD (peripheral artery disease) (HCC)    Chronic renal insufficiency     Past Medical History:  Diagnosis Date  Anemia    Arthritis    "left knee" (11/24/2017)   CAD (coronary artery disease)    CABG 2002   Cancer [facility])    Multiple Myeloma   CHF (congestive heart failure) ([place]) 2021   Chronic kidney disease    Chronic renal insufficiency    Diabetes mellitus without complication (HCC)    Dilated cardiomyopathy ([place])    echo in 2009 showed improvement with a  normal EF   HTN (hypertension)    Hyperlipemia    Hypertension    Noncompliance    PAD (peripheral artery disease) (HCC)    Pneumonia     Family History  Problem Relation Age of Onset   Hypertension Father    Diabetes Mother        DIABETIC COMA   Healthy Daughter    Past Surgical History:  Procedure Laterality Date   CARDIAC CATHETERIZATION  2002, 2006   CORONARY ARTERY BYPASS GRAFT  2002   x4 DR. VAN TRIGT   IR FLUORO GUIDED NEEDLE PLC ASPIRATION/INJECTION LOC  10/24/2019   TRANSURETHRAL RESECTION OF PROSTATE N/A 01/06/2021   Procedure: CYSTOSCOPY WITH CYSTOGRAM/TRANSURETHRAL RESECTION OF THE PROSTATE (TURP);  Surgeon: Lucas Mallow, MD;  Location: [facility]S;  Service: Urology;  Laterality: N/A;   Social History   Social History Narrative   ** Merged History Encounter **       Immunization History  Administered Date(s) Administered   Influenza,inj,Quad PF,6+ Mos 06/04/2020   PFIZER(Purple Top)SARS-COV-2 Vaccination 10/19/2019, 11/13/2019, 04/30/2020   Pneumococcal Polysaccharide-23 07/25/2020   Tdap 10/04/2019     Objective: Vital Signs: BP (!) 172/71 (BP Location: Left Arm, Patient Position: Sitting, Cuff Size: Normal)   Pulse 76   Ht 5' 9.5" (1.765 m)   Wt 175 lb 3.2 oz (79.5 kg)   BMI 25.50 kg/m    Physical Exam Vitals and nursing note reviewed.  Constitutional:      Appearance: He is well-developed.  HENT:     Head: Normocephalic and atraumatic.  Eyes:     Conjunctiva/sclera: Conjunctivae normal.     Pupils: Pupils are equal, round, and reactive to light.  Cardiovascular:     Rate and Rhythm: Normal rate and regular rhythm.     Heart sounds: Normal heart sounds.  Pulmonary:     Effort: Pulmonary effort is normal.     Breath sounds: Normal breath sounds.  Abdominal:     General: Bowel sounds are normal.     Palpations: Abdomen is soft.  Musculoskeletal:     Cervical back: Normal range of motion and neck supple.  Skin:    General: Skin is warm  and dry.     Capillary Refill: Capillary refill takes less than 2 seconds.  Neurological:     Mental Status: He is alert and oriented to person, place, and time.  Psychiatric:        Behavior: Behavior normal.     Musculoskeletal Exam: Limited range of motion of the cervical spine.  Shoulder joints and elbow joints with good range of motion.  He has some limitation with range of motion of his wrist joints without any synovitis.  He had bilateral MCP PIP and DIP thickening with no synovitis.  He had nodulosis over bilateral elbows, and his hands.  Hip joints are in good range of motion.  He had warmth and swelling in his right knee joint.  Left knee joint was in good range of motion without any warmth swelling or effusion.  He had  no tenderness over ankles or MTPs.  CDAI Exam: CDAI Score: -- Patient Global: --; Provider Global: -- Swollen: --; Tender: -- Joint Exam 04/02/2021   No joint exam has been documented for this visit   There is currently no information documented on the homunculus. Go to the Rheumatology activity and complete the homunculus joint exam.  Investigation: No additional findings.  Imaging: CT HEAD WO CONTRAST (5MM)  Result Date: 03/08/2021 CLINICAL DATA:  Neurologic deficit. EXAM: CT HEAD WITHOUT CONTRAST TECHNIQUE: Contiguous axial images were obtained from the base of the skull through the vertex without intravenous contrast. COMPARISON:  03/19/2005 FINDINGS: Brain: No evidence of acute infarction, hemorrhage, hydrocephalus, extra-axial collection or mass lesion/mass effect. There is mild diffuse low-attenuation within the subcortical and periventricular white matter compatible with chronic microvascular disease. Chronic left basal ganglia lacunar infarct. Prominence of the sulci and ventricles compatible with brain atrophy. Vascular: No hyperdense vessel or unexpected calcification. Skull: Normal. Negative for fracture or focal lesion. Sinuses/Orbits: No acute  finding. Other: None IMPRESSION: 1. No acute intracranial abnormalities. 2. 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 Electronically Signed   By: Kerby Moors M.D.   On: 03/08/2021 07:58    Recent Labs: Lab Results  Component Value Date   WBC 7.3 03/08/2021   HGB 14.1 03/08/2021   PLT 227 03/08/2021   NA 140 03/08/2021   K 3.7 03/08/2021   CL 106 03/08/2021   CO2 22 03/08/2021   GLUCOSE 168 (H) 03/08/2021   BUN 31 (H) 03/08/2021   CREATININE 1.69 (H) 03/08/2021   BILITOT 1.3 (H) 03/08/2021   ALKPHOS 111 03/08/2021   AST 15 03/08/2021   ALT 11 03/08/2021   PROT 7.6 03/08/2021   ALBUMIN 4.0 03/08/2021   CALCIUM 9.4 03/08/2021   GFRAA 58 (L) 04/23/2020    Speciality Comments: No specialty comments available.  Procedures:  Large Joint Inj on 04/02/2021 10:27 AM Indications: pain Details: 27 G 1.5 in needle, medial approach  Arthrogram: No  Medications: 40 mg triamcinolone acetonide 40 MG/ML; 3 mL lidocaine 1 % Aspirate: 12 mL Outcome: tolerated well, no immediate complications Procedure, treatment alternatives, risks and benefits explained, specific risks discussed. Consent was given by the patient. Immediately prior to procedure a time out was called to verify the correct patient, procedure, equipment, support staff and site/side marked as required. Patient was prepped and draped in the usual sterile fashion.    Allergies: Metformin and related   Assessment / Plan:     Visit Diagnoses: Rheumatoid arthritis involving multiple sites with positive rheumatoid factor (HCC) - RF 22, anti-CCP negative, severe erosive disease with nodulosis.  He returns today after his last visit in February 2022.  He had no synovitis at the initial visit.  He is also getting chemotherapy for multiple myeloma.  He has been having pain and swelling in his right knee joint for the last for 5 days.  He is having difficulty walking due to discomfort.  He has been using a walker.  I had  brief discussion with the patient regarding immunosuppressive therapy for rheumatoid arthritis but patient declined.  His wife is stated that he has multiple medical problems and gets frequent infections.  They would prefer not for him to take any immunosuppressive therapy.  At this point I am uncertain if the right knee effusion is due to rheumatoid arthritis or due to underlying osteoarthritis.  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  No active synovitis was  noted.  Effusion, right knee -he developed pain and swelling in his right knee joint about 4 days ago.  He has been having difficulty walking.  He has been using a walker.  He is diabetic and also has hypertension.  We discussed different treatment options.  As he was having severe pain and difficulty walking we decided to aspirate his knee joint.  After informed consent was obtained right knee joint was aspirated as described above.  The synovial fluid was sent for analysis.  His right knee was also injected with 40 mg of Kenalog.  Side effects of Kenalog including elevation blood pressure and blood sugar was discussed.  I advised him to contact his PCP for further management of blood pressure and blood sugar.  Plan: Anaerobic and Aerobic Culture, Synovial Fluid Analysis, Complete  History of multiple myeloma -s/p chemotherapy.  He was evaluated by Dr. Irene Limbo in March 2022.  I reviewed the notes and he appears to be in remission.  Anemia, unspecified type - Due to multiple myeloma.  Single subsegmental pulmonary embolism without acute cor pulmonale (HCC)  PAD (peripheral artery disease) ([place])  Primary hypertension  Coronary artery disease involving native coronary artery of native heart without angina pectoris  Dilated cardiomyopathy (HCC)  Acute hypoxemic respiratory failure (HCC)  History of hyperlipidemia  Chronic renal impairment, stage 3a (HCC)  Severe sepsis ([place])  Uncontrolled type  2 diabetes mellitus with hyperglycemia ([place])  Orders: Orders Placed This Encounter  Procedures   Large Joint Inj   Anaerobic and Aerobic Culture   Synovial Fluid Analysis, Complete    No orders of the defined types were placed in this encounter.    Follow-Up Instructions: Return in about 3 months (around 07/02/2021) for Rheumatoid arthritis.   Bo Merino, MD  Note - This record has been created using Editor, commissioning.  Chart creation errors have been sought, but may not always  have been located. Such creation errors do not reflect on  the standard of medical care.

## 2021-04-02 ENCOUNTER — Encounter: Payer: Self-pay | Admitting: Rheumatology

## 2021-04-02 ENCOUNTER — Other Ambulatory Visit: Payer: Self-pay

## 2021-04-02 ENCOUNTER — Ambulatory Visit (INDEPENDENT_AMBULATORY_CARE_PROVIDER_SITE_OTHER): Payer: Medicare Other | Admitting: Rheumatology

## 2021-04-02 VITALS — BP 172/71 | HR 76 | Ht 69.5 in | Wt 175.2 lb

## 2021-04-02 DIAGNOSIS — M79641 Pain in right hand: Secondary | ICD-10-CM | POA: Diagnosis not present

## 2021-04-02 DIAGNOSIS — Z8579 Personal history of other malignant neoplasms of lymphoid, hematopoietic and related tissues: Secondary | ICD-10-CM | POA: Diagnosis not present

## 2021-04-02 DIAGNOSIS — I420 Dilated cardiomyopathy: Secondary | ICD-10-CM

## 2021-04-02 DIAGNOSIS — I739 Peripheral vascular disease, unspecified: Secondary | ICD-10-CM

## 2021-04-02 DIAGNOSIS — M25461 Effusion, right knee: Secondary | ICD-10-CM

## 2021-04-02 DIAGNOSIS — I2510 Atherosclerotic heart disease of native coronary artery without angina pectoris: Secondary | ICD-10-CM | POA: Diagnosis not present

## 2021-04-02 DIAGNOSIS — M0579 Rheumatoid arthritis with rheumatoid factor of multiple sites without organ or systems involvement: Secondary | ICD-10-CM

## 2021-04-02 DIAGNOSIS — E1165 Type 2 diabetes mellitus with hyperglycemia: Secondary | ICD-10-CM

## 2021-04-02 DIAGNOSIS — J9601 Acute respiratory failure with hypoxia: Secondary | ICD-10-CM

## 2021-04-02 DIAGNOSIS — G510 Bell's palsy: Secondary | ICD-10-CM | POA: Insufficient documentation

## 2021-04-02 DIAGNOSIS — Z8639 Personal history of other endocrine, nutritional and metabolic disease: Secondary | ICD-10-CM

## 2021-04-02 DIAGNOSIS — D649 Anemia, unspecified: Secondary | ICD-10-CM

## 2021-04-02 DIAGNOSIS — M79642 Pain in left hand: Secondary | ICD-10-CM

## 2021-04-02 DIAGNOSIS — H7202 Central perforation of tympanic membrane, left ear: Secondary | ICD-10-CM | POA: Insufficient documentation

## 2021-04-02 DIAGNOSIS — I25810 Atherosclerosis of coronary artery bypass graft(s) without angina pectoris: Secondary | ICD-10-CM

## 2021-04-02 DIAGNOSIS — A419 Sepsis, unspecified organism: Secondary | ICD-10-CM

## 2021-04-02 DIAGNOSIS — H6122 Impacted cerumen, left ear: Secondary | ICD-10-CM | POA: Insufficient documentation

## 2021-04-02 DIAGNOSIS — I2693 Single subsegmental pulmonary embolism without acute cor pulmonale: Secondary | ICD-10-CM

## 2021-04-02 DIAGNOSIS — N1831 Chronic kidney disease, stage 3a: Secondary | ICD-10-CM

## 2021-04-02 DIAGNOSIS — I10 Essential (primary) hypertension: Secondary | ICD-10-CM

## 2021-04-02 DIAGNOSIS — R6520 Severe sepsis without septic shock: Secondary | ICD-10-CM

## 2021-04-02 MED ORDER — LIDOCAINE HCL 1 % IJ SOLN
3.0000 mL | INTRAMUSCULAR | Status: AC | PRN
  Administered 2021-04-02: 3 mL

## 2021-04-02 MED ORDER — TRIAMCINOLONE ACETONIDE 40 MG/ML IJ SUSP
40.0000 mg | INTRAMUSCULAR | Status: AC | PRN
  Administered 2021-04-02: 40 mg via INTRA_ARTICULAR

## 2021-04-02 NOTE — Progress Notes (Signed)
Synovial fluid is not inflammatory.  No crystals seen.

## 2021-04-07 LAB — ANAEROBIC AND AEROBIC CULTURE
AER RESULT:: NO GROWTH
MICRO NUMBER:: 12403788
MICRO NUMBER:: 12403789
SPECIMEN QUALITY:: ADEQUATE
SPECIMEN QUALITY:: ADEQUATE

## 2021-04-07 LAB — SYNOVIAL FLUID ANALYSIS, COMPLETE
Basophils, %: 0 %
Eosinophils-Synovial: 0 % (ref 0–2)
Lymphocytes-Synovial Fld: 11 % (ref 0–74)
Monocyte/Macrophage: 16 % (ref 0–69)
Neutrophil, Synovial: 73 % — ABNORMAL HIGH (ref 0–24)
Synoviocytes, %: 0 % (ref 0–15)
WBC, Synovial: 335 cells/uL — ABNORMAL HIGH (ref <150)

## 2021-04-07 NOTE — Progress Notes (Signed)
Synovial fluid culture is negative.

## 2021-04-10 ENCOUNTER — Other Ambulatory Visit: Payer: Self-pay

## 2021-04-10 NOTE — Telephone Encounter (Signed)
Patient's wife [name] called stating [name]'s knees are swollen again.  She states at his appointment last week he had fluid drained and states it has returned.  Patient requested a return call to let her know if he can schedule another appointment to have more fluid drained.

## 2021-04-10 NOTE — Telephone Encounter (Signed)
He cannot have another cortisone injection.  He declined treatment for rheumatoid arthritis.  He can send a prescription for prednisone starting at 20 mg and taper by 5 mg every 4 days.  Although prednisone will help him temporarily and then the symptoms will come back.

## 2021-04-11 MED ORDER — PREDNISONE 5 MG PO TABS: ORAL_TABLET | ORAL | 0 refills | Status: DC

## 2021-04-11 NOTE — Telephone Encounter (Signed)
[name] advised, [name] cannot have another cortisone injection.  He declined treatment for rheumatoid arthritis.  Advised her we can send a prescription for prednisone starting at 20 mg and taper by 5 mg every 4 days.  Although prednisone will help him temporarily and then the symptoms will come back. She expressed understanding.

## 2021-04-14 ENCOUNTER — Other Ambulatory Visit (INDEPENDENT_AMBULATORY_CARE_PROVIDER_SITE_OTHER): Payer: Medicare Other

## 2021-04-14 ENCOUNTER — Other Ambulatory Visit: Payer: Self-pay

## 2021-04-14 DIAGNOSIS — E1165 Type 2 diabetes mellitus with hyperglycemia: Secondary | ICD-10-CM

## 2021-04-14 LAB — BASIC METABOLIC PANEL
BUN: 32 mg/dL — ABNORMAL HIGH (ref 6–23)
CO2: 23 mEq/L (ref 19–32)
Calcium: 8.6 mg/dL (ref 8.4–10.5)
Chloride: 105 mEq/L (ref 96–112)
Creatinine, Ser: 1.56 mg/dL — ABNORMAL HIGH (ref 0.40–1.50)
GFR: 46.23 mL/min — ABNORMAL LOW (ref >60.00)
Glucose, Bld: 391 mg/dL — ABNORMAL HIGH (ref 70–99)
Potassium: 4.3 mEq/L (ref 3.5–5.1)
Sodium: 137 mEq/L (ref 135–145)

## 2021-04-15 LAB — FRUCTOSAMINE: Fructosamine: 347 umol/L — ABNORMAL HIGH (ref 0–285)

## 2021-04-15 LAB — TESTOSTERONE: Testosterone: <10

## 2021-04-18 ENCOUNTER — Ambulatory Visit: Payer: Medicare Other | Admitting: Internal Medicine

## 2021-04-18 ENCOUNTER — Ambulatory Visit (INDEPENDENT_AMBULATORY_CARE_PROVIDER_SITE_OTHER): Payer: Medicare Other | Admitting: Endocrinology

## 2021-04-18 ENCOUNTER — Other Ambulatory Visit: Payer: Self-pay

## 2021-04-18 VITALS — BP 140/75 | HR 105 | Ht 69.5 in | Wt 181.4 lb

## 2021-04-18 DIAGNOSIS — I10 Essential (primary) hypertension: Secondary | ICD-10-CM

## 2021-04-18 DIAGNOSIS — I25810 Atherosclerosis of coronary artery bypass graft(s) without angina pectoris: Secondary | ICD-10-CM | POA: Diagnosis not present

## 2021-04-18 DIAGNOSIS — E1165 Type 2 diabetes mellitus with hyperglycemia: Secondary | ICD-10-CM

## 2021-04-18 DIAGNOSIS — E7800 Pure hypercholesterolemia, unspecified: Secondary | ICD-10-CM

## 2021-04-18 DIAGNOSIS — Z23 Encounter for immunization: Secondary | ICD-10-CM | POA: Diagnosis not present

## 2021-04-18 NOTE — Progress Notes (Signed)
Patient ID: [name], male   DOB: Dec 28, 1955, 65 y.o.   MRN: [id number]           Reason for Appointment: Follow-up for Type 2 Diabetes   History of Present Illness:          Date of diagnosis of type 2 diabetes mellitus:   2018      Background history:  He has never been told to have diabetes but review of his previous labs indicate that he had high blood sugar 179 in 2014 His highest sugar has been 234 in 10/2016 when his A1c was 8.8, not clear if he was symptomatic at that time  Recent history:   A1c is fructosamine much higher at 347, previous 8.5   Non-insulin hypoglycemic drugs the patient is taking are:   Prandin 2 mg at meals  Current management, blood sugar patterns and problems identified:  He did bring his monitor for download He has checked his blood sugars with the help of his wife but has only 5 readings in the last 2 weeks He is mostly checking fasting readings  He was given a course of steroids just over 2 weeks ago  Prior to starting this his blood sugar was 10 5 in the morning but subsequently has been between 276 and 385 fasting  Late afternoon reading was 440 checked once and lab glucose 391 Has not called for high readings Also recently says he is usually consuming 1 regular soft drink daily Not able to do any exercise          Not able to take metformin because of renal function abnormalities  Glucose monitoring:   has Accu-Chek guide meter  Blood sugars as above Previous FASTING averaging 144 range 102-218  Dietician visit, most recent: 2/21  Weight history:  Wt Readings from Last 3 Encounters:  04/18/21 181 lb 6.4 oz (82.3 kg)  04/02/21 175 lb 3.2 oz (79.5 kg)  03/14/21 167 lb (75.8 kg)    Glycemic control:   Lab Results  Component Value Date   HGBA1C 8.5 (H) 02/10/2021   HGBA1C 6.6 (H) 01/02/2021   HGBA1C 5.9 11/08/2020   Lab Results  Component Value Date   MICROALBUR 12.6 (H) 07/21/2018   LDLCALC 39 09/04/2019    CREATININE 1.56 (H) 04/14/2021   Lab Results  Component Value Date   MICRALBCREAT 10.0 07/21/2018    Lab Results  Component Value Date   FRUCTOSAMINE 347 (H) 04/14/2021   FRUCTOSAMINE 237 11/08/2020   FRUCTOSAMINE 327 (H) 05/28/2020   Lab Results  Component Value Date   HGB 14.1 03/08/2021    Lab on 04/14/2021  Component Date Value Ref Range Status   Sodium 04/14/2021 137  135 - 145 mEq/L Final   Potassium 04/14/2021 4.3  3.5 - 5.1 mEq/L Final   Chloride 04/14/2021 105  96 - 112 mEq/L Final   CO2 04/14/2021 23  19 - 32 mEq/L Final   Glucose, Bld 04/14/2021 391 (A) 70 - 99 mg/dL Final   BUN 04/14/2021 32 (A) 6 - 23 mg/dL Final   Creatinine, Ser 04/14/2021 1.56 (A) 0.40 - 1.50 mg/dL Final   GFR 04/14/2021 46.23 (A) >60.00 mL/min Final   Calculated using the CKD-EPI Creatinine Equation (2021)   Calcium 04/14/2021 8.6  8.4 - 10.5 mg/dL Final   Fructosamine 04/14/2021 347 (A) 0 - 285 umol/L Final   Comment: Published reference interval for apparently healthy subjects between age 11 and 24 is 33 - 285 umol/L and  in a poorly controlled diabetic population is 228 - 563 umol/L with a mean of 396 umol/L.     Allergies as of 04/18/2021       Reactions   Metformin And Related Diarrhea        Medication List        Accurate as of April 18, 2021 11:59 PM. If you have any questions, ask your nurse or doctor.          Accu-Chek Guide Me w/Device Kit 1 each by Does not apply route.   Accu-Chek Guide test strip Generic drug: glucose blood Use Accu Chek test strips as instructed to check blood sugar fasting or two hours after meals, every other day.   Accu-Chek Softclix Lancets lancets Use Accu Chek softclix lancets to check blood sugar fasting or 2 hours after a meal every other day.   amLODipine 5 MG tablet Commonly known as: NORVASC Take 1 tablet (5 mg total) by mouth daily.   aspirin EC 81 MG tablet Take 81 mg by mouth daily. Swallow whole.   atorvastatin 80  MG tablet Commonly known as: LIPITOR Take 1 tablet by mouth once daily   carvedilol 25 MG tablet Commonly known as: COREG Take 25 mg by mouth 2 (two) times daily with a meal.   ciprofloxacin-dexamethasone OTIC suspension Commonly known as: CIPRODEX Place 4 drops into the left ear 2 (two) times daily.   hydrALAZINE 50 MG tablet Commonly known as: APRESOLINE TAKE 1 TABLET BY MOUTH THREE TIMES DAILY   hydroxypropyl methylcellulose / hypromellose 2.5 % ophthalmic solution Commonly known as: ISOPTO TEARS / GONIOVISC Place 2 drops into the left eye as needed for dry eyes.   isosorbide mononitrate 30 MG 24 hr tablet Commonly known as: IMDUR Take 1 tablet (30 mg total) by mouth daily.   meclizine 12.5 MG tablet Commonly known as: ANTIVERT Take 1-2 tablets (12.5-25 mg total) by mouth 3 (three) times daily as needed for dizziness.   methylPREDNISolone 4 MG Tbpk tablet Commonly known as: MEDROL DOSEPAK Use as directed   ofloxacin 0.3 % OTIC solution Commonly known as: FLOXIN Place 10 drops into the left ear daily.   potassium chloride SA 20 MEQ tablet Commonly known as: KLOR-CON Take 1 tablet by mouth once daily   predniSONE 5 MG tablet Commonly known as: DELTASONE Take 4 tabs po x 4 days, 3  tabs po x 4 days, 2  tabs po x 4 days, 1  tab po x 4 days   prochlorperazine 10 MG tablet Commonly known as: COMPAZINE Take 1 tablet (10 mg total) by mouth every 6 (six) hours as needed (Nausea or vomiting).   repaglinide 2 MG tablet Commonly known as: Prandin Take 1 tablet (2 mg total) by mouth 3 (three) times daily before meals.   tamsulosin 0.4 MG Caps capsule Commonly known as: FLOMAX Take 1 capsule (0.4 mg total) by mouth daily.        Allergies:  Allergies  Allergen Reactions   Metformin And Related Diarrhea    Past Medical History:  Diagnosis Date   Anemia    Arthritis    "left knee" (11/24/2017)   CAD (coronary artery disease)    CABG 2002   Cancer [facility])     Multiple Myeloma   CHF (congestive heart failure) ([place]) 2021   Chronic kidney disease    Chronic renal insufficiency    Diabetes mellitus without complication ([place])    Dilated cardiomyopathy ([place])    echo in 2009  showed improvement with a normal EF   HTN (hypertension)    Hyperlipemia    Hypertension    Noncompliance    PAD (peripheral artery disease) ([place])    Pneumonia     Past Surgical History:  Procedure Laterality Date   CARDIAC CATHETERIZATION  2002, 2006   CORONARY ARTERY BYPASS GRAFT  2002   x4 DR. VAN TRIGT   IR FLUORO GUIDED NEEDLE PLC ASPIRATION/INJECTION LOC  10/24/2019   TRANSURETHRAL RESECTION OF PROSTATE N/A 01/06/2021   Procedure: CYSTOSCOPY WITH CYSTOGRAM/TRANSURETHRAL RESECTION OF THE PROSTATE (TURP);  Surgeon: Lucas Mallow, MD;  Location: [facility];  Service: Urology;  Laterality: N/A;    Family History  Problem Relation Age of Onset   Hypertension Father    Diabetes Mother        DIABETIC COMA   Healthy Daughter     Social History:  reports that he has never smoked. He has never used smokeless tobacco. He reports that he does not currently use alcohol. He reports that he does not currently use drugs.   Review of Systems   Lipid history: On atorvastatin 80 mg from his cardiologist since diagnosis of CAD, labs as follows    Lab Results  Component Value Date   CHOL 92 (L) 09/04/2019   HDL 35 (L) 09/04/2019   LDLCALC 39 09/04/2019   LDLDIRECT 61.0 11/08/2020   TRIG 89 09/04/2019   CHOLHDL 2.6 09/04/2019           Hypertension: Has been followed by cardiologist for blood pressure management Blood pressure is frequently high However blood pressure done on the second attempt today later in the office visit was improving He says he is taking his medication regularly  Aldosterone level has been checked for his history of hypokalemia and is normal  Recent readings:  BP Readings from Last 3 Encounters:  04/18/21 140/75  04/02/21 (!) 172/71   03/14/21 140/90   RENAL dysfunction:  Renal dysfunction not related to diabetes Has variable levels as follows No history of proteinuria  Lab Results  Component Value Date   CREATININE 1.56 (H) 04/14/2021   CREATININE 1.69 (H) 03/08/2021   CREATININE 1.80 (H) 02/10/2021   Lab Results  Component Value Date   K 4.3 04/14/2021    Most recent eye exam was in 2018  Most recent foot exam: 07/2018   LABS:  Lab on 04/14/2021  Component Date Value Ref Range Status   Sodium 04/14/2021 137  135 - 145 mEq/L Final   Potassium 04/14/2021 4.3  3.5 - 5.1 mEq/L Final   Chloride 04/14/2021 105  96 - 112 mEq/L Final   CO2 04/14/2021 23  19 - 32 mEq/L Final   Glucose, Bld 04/14/2021 391 (A) 70 - 99 mg/dL Final   BUN 04/14/2021 32 (A) 6 - 23 mg/dL Final   Creatinine, Ser 04/14/2021 1.56 (A) 0.40 - 1.50 mg/dL Final   GFR 04/14/2021 46.23 (A) >60.00 mL/min Final   Calculated using the CKD-EPI Creatinine Equation (2021)   Calcium 04/14/2021 8.6  8.4 - 10.5 mg/dL Final   Fructosamine 04/14/2021 347 (A) 0 - 285 umol/L Final   Comment: Published reference interval for apparently healthy subjects between age 105 and 27 is 75 - 285 umol/L and in a poorly controlled diabetic population is 228 - 563 umol/L with a mean of 396 umol/L.     Physical Examination:  BP 140/75   Pulse (!) 105   Ht 5' 9.5" (1.765 m)   Wt  181 lb 6.4 oz (82.3 kg)   SpO2 98%   BMI 26.40 kg/m   No ankle edema present       ASSESSMENT:  Diabetes type 2, mild  See history of present illness for detailed discussion of current diabetes management, blood sugar patterns and problems identified  A1c is last higher and now fructosamine is high at 347  Not clear why his blood sugars have been fluctuating but at least in the last couple of weeks high sugars are related to prednisone for his arthritis which he is still taking He is on a regimen of only 2 mg Prandin before each meal Despite readings as high as 440 he  refuses to consider insulin  Again not a candidate for metformin because of renal dysfunction which is variable He also does not like brand-name medications because of cost Likely has increased thirst from hyperglycemia and is consuming regular soft drinks also  Fasting blood sugars have also gone up with taking steroid  Hypertension: Variable control although blood pressure was better on 6 measurement  PLAN:      He will now take Repeglanide 2 tablets of the 2 mg before each meal for about a week or at least until his blood sugars are improving Since his joint pains are much better he will taper off his prednisone more quickly than prescribed to avoid hyperglycemia He was advised to check his blood sugars at least twice a day in the next few days and call us if consistently high He can have his wife to help him with checking Discussed blood sugar targets after meals and also fasting  May also consider adding an SGLT2 drug because of his kidney disease Needs significantly reduced sugar intake especially with sweet drinks  Continue same blood pressure medications and discuss with cardiologist at upcoming visit Also needs follow-up lipids  Flu vaccine given  Will again need short-term follow-up  Patient Instructions  Take prednisone $RemoveBeforeD'5mg'[id number]$  2 pills for 4 days and 1 for 4 more days  Repaglanide 2 pills with each meal for 1 week then go back to 1 each meal  NO REGULAR SOFT DRINKS  Check sugar 2x daily     Elayne Snare 04/19/2021, 12:56 PM   Note: This office note was prepared with Dragon voice recognition system technology. Any transcriptional errors that result from this process are unintentional.

## 2021-04-18 NOTE — Patient Instructions (Addendum)
Take prednisone 5mg  2 pills for 4 days and 1 for 4 more days  Repaglanide 2 pills with each meal for 1 week then go back to 1 each meal  NO REGULAR SOFT DRINKS  Check sugar 2x daily

## 2021-04-25 NOTE — Progress Notes (Signed)
CARDIOLOGY OFFICE NOTE  Date:  04/28/2021    [name] Date of Birth: 1955/10/05 Medical Record #[id number]  PCP:  [name], Joseph Halsted, MD  Cardiologist:  Dr Welch  Chief Complaint  Patient presents with   Coronary Artery Disease     History of Present Illness: [name] is a 65 y.o. male who presents today for a follow up visit.     He has a history of CAD s/p CABG 2002, HTN, dilated cardiomyopathy, HLD and long standing past noncompliance with follow-up/meds. Echocardiogram in 2009 showed significant improvement in his LV function and EF was normal.  Cardiac catheterization in 2006 demonstrated disease but he was not a candidate for further revascularization. Remote CTA abdomen showed 50% left renal artery stenosis in 2006 as well.  Follow up renal duplex was ok.    He has been followed primarily by Joseph Merle NP- he tended to go long periods between visits despite our recommendations. He ended up being admitted in May of 2019 with marked HTN and CHF requiring aggressive diuresis. Echo showed EF of 50 to 55% with DD. He has been found to have diabetes - took several visits to convince him to see Endocrine. His medicines always tend to be unclear as to what he is exactly taking.   When seen back in February 2021- total protein elevated - sent to hematology - concern for MM. Noted significant weight loss. When seen back in April 2021- he had retired - he had followed thru with getting primary care, seeing hematology, Renal, etc. He noted he "had more time to go to the doctor since he had retired". He was to have bone marrow biopsy with Dr. Irene Welch. He had not started his Norvasc that I had started at the visit prior - but otherwise, felt to be doing ok. When seen in June - he was doing ok - on infusions of Darzalex for his multiple myeloma. BP had improved with the addition of Norvasc.  Had visit in October - had been admitted the month prior with  hypoxemia/UTI/sepsis - small PE - was on short course of Eliquis - ARB had been stopped. Unclear etiology of those events.   He was  admitted 3/28-10/11/20: he presented to the ED on the advice of oncology who noted 15 lbs weight loss associated with anorexia. In the ED he was febrile with leukocytosis (WBC 12.3k), creatinine elevated to 2.81, and CT abd/pelvis revealing severe bilateral hydroureteronephrosis and an irregularly thickened, lobular urinary bladder with marked irregular mural thickening as well as more masslike extension from the bladder dome which closely apposes several adjacent loops of thickened small bowel and colon with loss of discernible fat plane. He was started on IV fluids and antibiotics for sepsis with urine culture revealing MSSA. Urology was consulted, advising continuing foley catheter until follow up in urology clinic after treatment of sepsis. Creatinine  improved  to 1.35 on day of discharge.   He underwent cystoscopy and TURP in June for BPH. He is followed by Dr [name] for his DM and Dr [name] for his RA.   On follow up today he has a swollen and painful right knee. This has been injected. Walking with a walker. Limited activity. Has also had Bell's palsy and vertigo- these are improving. He has no chest pain  or dyspnea.           Past Medical History:  Diagnosis Date   Anemia    Arthritis    "  left knee" (11/24/2017)   CAD (coronary artery disease)    CABG 2002   Cancer [facility])    Multiple Myeloma   CHF (congestive heart failure) ([place]) 2021   Chronic kidney disease    Chronic renal insufficiency    Diabetes mellitus without complication ([place])    Dilated cardiomyopathy ([place])    echo in 2009 showed improvement with a normal EF   HTN (hypertension)    Hyperlipemia    Hypertension    Noncompliance    PAD (peripheral artery disease) ([place])    Pneumonia     Past Surgical History:  Procedure Laterality Date   CARDIAC CATHETERIZATION  2002, 2006    CORONARY ARTERY BYPASS GRAFT  2002   x4 DR. VAN Welch   IR FLUORO GUIDED NEEDLE PLC ASPIRATION/INJECTION LOC  10/24/2019   TRANSURETHRAL RESECTION OF PROSTATE N/A 01/06/2021   Procedure: CYSTOSCOPY WITH CYSTOGRAM/TRANSURETHRAL RESECTION OF THE PROSTATE (TURP);  Surgeon: Joseph Mallow, MD;  Location: [facility];  Service: Urology;  Laterality: N/A;     Medications: Current Meds  Medication Sig   Accu-Chek Softclix Lancets lancets Use Accu Chek softclix lancets to check blood sugar fasting or 2 hours after a meal every other day.   aspirin EC 81 MG tablet Take 81 mg by mouth daily. Swallow whole.   atorvastatin (LIPITOR) 80 MG tablet Take 1 tablet by mouth once daily (Patient taking differently: Take 80 mg by mouth daily.)   Blood Glucose Monitoring Suppl (ACCU-CHEK GUIDE ME) w/Device KIT 1 each by Does not apply route.   carvedilol (COREG) 25 MG tablet Take 25 mg by mouth 2 (two) times daily with a meal.   ciprofloxacin-dexamethasone (CIPRODEX) OTIC suspension Place 4 drops into the left ear 2 (two) times daily.   glucose blood (ACCU-CHEK GUIDE) test strip Use Accu Chek test strips as instructed to check blood sugar fasting or two hours after meals, every other day.   hydrALAZINE (APRESOLINE) 50 MG tablet TAKE 1 TABLET BY MOUTH THREE TIMES DAILY (Patient taking differently: Take 50 mg by mouth 3 (three) times daily.)   hydroxypropyl methylcellulose / hypromellose (ISOPTO TEARS / GONIOVISC) 2.5 % ophthalmic solution Place 2 drops into the left eye as needed for dry eyes.   isosorbide mononitrate (IMDUR) 30 MG 24 hr tablet Take 1 tablet (30 mg total) by mouth daily.   potassium chloride SA (KLOR-CON) 20 MEQ tablet Take 1 tablet by mouth once daily (Patient taking differently: Take 20 mEq by mouth daily.)   predniSONE (DELTASONE) 5 MG tablet Take 4 tabs po x 4 days, 3  tabs po x 4 days, 2  tabs po x 4 days, 1  tab po x 4 days   prochlorperazine (COMPAZINE) 10 MG tablet Take 1 tablet (10 mg total)  by mouth every 6 (six) hours as needed (Nausea or vomiting).   repaglinide (PRANDIN) 2 MG tablet Take 1 tablet (2 mg total) by mouth 3 (three) times daily before meals.   tamsulosin (FLOMAX) 0.4 MG CAPS capsule Take 1 capsule (0.4 mg total) by mouth daily.     Allergies: Allergies  Allergen Reactions   Metformin And Related Diarrhea    Social History: The patient  reports that he has never smoked. He has never used smokeless tobacco. He reports that he does not currently use alcohol. He reports that he does not currently use drugs.   Family History: The patient's family history includes Diabetes in his mother; Healthy in his daughter; Hypertension in his father.  Review of Systems: Please see the history of present illness.   All other systems are reviewed and negative.   Physical Exam: VS:  BP 130/80 (BP Location: Left Arm)   Pulse 78   Ht 5' 9.5" (1.765 m)   Wt 190 lb 12.8 oz (86.5 kg)   SpO2 99%   BMI 27.77 kg/m  .  BMI Body mass index is 27.77 kg/m.  Wt Readings from Last 3 Encounters:  04/28/21 190 lb 12.8 oz (86.5 kg)  04/18/21 181 lb 6.4 oz (82.3 kg)  04/02/21 175 lb 3.2 oz (79.5 kg)    General: Alert and in no acute distress.  Walks with a walker.  HEENT: Normal.  Neck: Supple, no JVD, carotid bruits, or masses noted.  Cardiac: Regular rate and rhythm. No murmurs, rubs, or gallops. No edema.  Respiratory:  Lungs are clear to auscultation bilaterally with normal work of breathing.  GI: Soft and nontender.  Skin: Warm and dry. Color is normal.  Neuro:  Strength and sensation are intact and no gross focal deficits noted.  Psych: Alert, appropriate and with normal affect.   LABORATORY DATA:  EKG:  EKG is not ordered today.    Lab Results  Component Value Date   WBC 7.3 03/08/2021   HGB 14.1 03/08/2021   HCT 42.9 03/08/2021   PLT 227 03/08/2021   GLUCOSE 391 (H) 04/14/2021   CHOL 92 (L) 09/04/2019   TRIG 89 09/04/2019   HDL 35 (L) 09/04/2019    LDLDIRECT 61.0 11/08/2020   LDLCALC 39 09/04/2019   ALT 11 03/08/2021   AST 15 03/08/2021   NA 137 04/14/2021   K 4.3 04/14/2021   CL 105 04/14/2021   CREATININE 1.56 (H) 04/14/2021   BUN 32 (H) 04/14/2021   CO2 23 04/14/2021   TSH 0.916 10/07/2020   INR 0.9 01/02/2021   HGBA1C 8.5 (H) 02/10/2021   MICROALBUR 12.6 (H) 07/21/2018     BNP (last 3 results) No results for input(s): BNP in the last 8760 hours.   ProBNP (last 3 results) No results for input(s): PROBNP in the last 8760 hours.   Other Studies Reviewed Today:  Echo 11/25/2017 LV EF: 50% -   55% Study Conclusions   - Left ventricle: The cavity size was normal. There was moderate   concentric hypertrophy. Systolic function was normal. The   estimated ejection fraction was in the range of 50% to 55%. Wall   motion was normal; there were no regional wall motion   abnormalities. Doppler parameters are consistent with abnormal   left ventricular relaxation (grade 1 diastolic dysfunction).   Doppler parameters are consistent with high ventricular filling   pressure. - Aortic valve: Transvalvular velocity was within the normal range.   There was no stenosis. There was no regurgitation. - Mitral valve: Transvalvular velocity was within the normal range.   There was no evidence for stenosis. There was no regurgitation. - Left atrium: The atrium was moderately dilated. - Right ventricle: The cavity size was normal. Wall thickness was   normal. Systolic function was normal. - Right atrium: The atrium was severely dilated. - Atrial septum: No defect or patent foramen ovale was identified   by color flow Doppler. - Tricuspid valve: There was trivial regurgitation. - Pulmonary arteries: Systolic pressure was within the normal   range. PA peak pressure: 36 mm Hg (S).    Echo 03/14/20: IMPRESSIONS     1. Left ventricular ejection fraction, by estimation, is 55 to 60%.  The  left ventricle has normal function. The left  ventricle has no regional  wall motion abnormalities. Left ventricular diastolic parameters were  normal.   2. Right ventricular systolic function is normal. The right ventricular  size is normal.   3. Left atrial size was mildly dilated.   4. The mitral valve is normal in structure. Trivial mitral valve  regurgitation. No evidence of mitral stenosis.   5. The aortic valve is normal in structure. Aortic valve regurgitation is  trivial. No aortic stenosis is present.   6. The inferior vena cava is normal in size with greater than 50%  respiratory variability, suggesting right atrial pressure of 3 mmHg.   Assessment & Plan:  1. Ischemic heart disease/ CAD s/p CABG remotely.  managed medically - class 1 symptoms.   2. HTN - BP is well controlled on medication  3. Multiple Myeloma - per Oncology.  Patient reports he is in remission.  4. Chronic diastolic HF - well compensated.   5.  Acute on CKD - follows with Renal. Recent admission with obstructive uropathy. Now s/p TURP  6.  HLD - on statin therapy- excellent control   8. DM - per Endocrine.   9. Severe RA per rheumatology.    Signed: Saniya Tranchina [place], MD  04/28/2021 10:49 AM

## 2021-04-28 ENCOUNTER — Other Ambulatory Visit: Payer: Self-pay

## 2021-04-28 ENCOUNTER — Ambulatory Visit (INDEPENDENT_AMBULATORY_CARE_PROVIDER_SITE_OTHER): Payer: Medicare Other | Admitting: Cardiology

## 2021-04-28 ENCOUNTER — Encounter: Payer: Self-pay | Admitting: Cardiology

## 2021-04-28 VITALS — BP 130/80 | HR 78 | Ht 69.5 in | Wt 190.8 lb

## 2021-04-28 DIAGNOSIS — I10 Essential (primary) hypertension: Secondary | ICD-10-CM | POA: Diagnosis not present

## 2021-04-28 DIAGNOSIS — I25810 Atherosclerosis of coronary artery bypass graft(s) without angina pectoris: Secondary | ICD-10-CM | POA: Diagnosis not present

## 2021-04-28 DIAGNOSIS — E785 Hyperlipidemia, unspecified: Secondary | ICD-10-CM | POA: Diagnosis not present

## 2021-04-28 DIAGNOSIS — N184 Chronic kidney disease, stage 4 (severe): Secondary | ICD-10-CM

## 2021-05-07 ENCOUNTER — Emergency Department (HOSPITAL_COMMUNITY): Payer: Medicare Other

## 2021-05-07 ENCOUNTER — Emergency Department (HOSPITAL_COMMUNITY)
Admission: EM | Admit: 2021-05-07 | Discharge: 2021-05-07 | Disposition: A | Discharge status: Home / Self Care | Payer: Medicare Other | Attending: Student | Admitting: Student

## 2021-05-07 ENCOUNTER — Other Ambulatory Visit: Payer: Self-pay

## 2021-05-07 ENCOUNTER — Emergency Department (HOSPITAL_BASED_OUTPATIENT_CLINIC_OR_DEPARTMENT_OTHER)
Admit: 2021-05-07 | Discharge: 2021-05-07 | Disposition: A | Discharge status: Home / Self Care | Payer: Medicare Other | Attending: Student | Admitting: Student

## 2021-05-07 ENCOUNTER — Ambulatory Visit (INDEPENDENT_AMBULATORY_CARE_PROVIDER_SITE_OTHER): Payer: Medicare Other | Admitting: Internal Medicine

## 2021-05-07 ENCOUNTER — Encounter (HOSPITAL_COMMUNITY): Payer: Self-pay

## 2021-05-07 VITALS — BP 170/100 | HR 76 | Temp 98.4°F | Ht 69.5 in | Wt 174.9 lb

## 2021-05-07 DIAGNOSIS — I130 Hypertensive heart and chronic kidney disease with heart failure and stage 1 through stage 4 chronic kidney disease, or unspecified chronic kidney disease: Secondary | ICD-10-CM | POA: Diagnosis not present

## 2021-05-07 DIAGNOSIS — I2510 Atherosclerotic heart disease of native coronary artery without angina pectoris: Secondary | ICD-10-CM | POA: Insufficient documentation

## 2021-05-07 DIAGNOSIS — I25810 Atherosclerosis of coronary artery bypass graft(s) without angina pectoris: Secondary | ICD-10-CM | POA: Diagnosis not present

## 2021-05-07 DIAGNOSIS — Z951 Presence of aortocoronary bypass graft: Secondary | ICD-10-CM | POA: Insufficient documentation

## 2021-05-07 DIAGNOSIS — I5043 Acute on chronic combined systolic (congestive) and diastolic (congestive) heart failure: Secondary | ICD-10-CM | POA: Insufficient documentation

## 2021-05-07 DIAGNOSIS — R29898 Other symptoms and signs involving the musculoskeletal system: Secondary | ICD-10-CM | POA: Diagnosis not present

## 2021-05-07 DIAGNOSIS — Z7984 Long term (current) use of oral hypoglycemic drugs: Secondary | ICD-10-CM | POA: Diagnosis not present

## 2021-05-07 DIAGNOSIS — Z79899 Other long term (current) drug therapy: Secondary | ICD-10-CM | POA: Diagnosis not present

## 2021-05-07 DIAGNOSIS — Z8579 Personal history of other malignant neoplasms of lymphoid, hematopoietic and related tissues: Secondary | ICD-10-CM | POA: Diagnosis not present

## 2021-05-07 DIAGNOSIS — Z7982 Long term (current) use of aspirin: Secondary | ICD-10-CM | POA: Diagnosis not present

## 2021-05-07 DIAGNOSIS — M79602 Pain in left arm: Secondary | ICD-10-CM

## 2021-05-07 DIAGNOSIS — E1122 Type 2 diabetes mellitus with diabetic chronic kidney disease: Secondary | ICD-10-CM | POA: Diagnosis not present

## 2021-05-07 DIAGNOSIS — N189 Chronic kidney disease, unspecified: Secondary | ICD-10-CM | POA: Diagnosis not present

## 2021-05-07 DIAGNOSIS — R531 Weakness: Secondary | ICD-10-CM | POA: Diagnosis present

## 2021-05-07 LAB — CBC WITH DIFFERENTIAL/PLATELET
Abs Immature Granulocytes: 0.16 10*3/uL — ABNORMAL HIGH (ref 0.00–0.07)
Basophils Absolute: 0 10*3/uL (ref 0.0–0.1)
Basophils Relative: 0 %
Eosinophils Absolute: 0.1 10*3/uL (ref 0.0–0.5)
Eosinophils Relative: 1 %
HCT: 39.6 % (ref 39.0–52.0)
Hemoglobin: 12.9 g/dL — ABNORMAL LOW (ref 13.0–17.0)
Immature Granulocytes: 2 %
Lymphocytes Relative: 12 %
Lymphs Abs: 1.2 10*3/uL (ref 0.7–4.0)
MCH: 27.7 pg (ref 26.0–34.0)
MCHC: 32.6 g/dL (ref 30.0–36.0)
MCV: 85 fL (ref 80.0–100.0)
Monocytes Absolute: 0.8 10*3/uL (ref 0.1–1.0)
Monocytes Relative: 8 %
Neutro Abs: 7.7 10*3/uL (ref 1.7–7.7)
Neutrophils Relative %: 77 %
Platelets: 203 10*3/uL (ref 150–400)
RBC: 4.66 MIL/uL (ref 4.22–5.81)
RDW: 15.3 % (ref 11.5–15.5)
WBC: 9.9 10*3/uL (ref 4.0–10.5)
nRBC: 0 % (ref 0.0–0.2)

## 2021-05-07 LAB — COMPREHENSIVE METABOLIC PANEL
ALT: 13 U/L (ref 0–44)
AST: 13 U/L — ABNORMAL LOW (ref 15–41)
Albumin: 3.9 g/dL (ref 3.5–5.0)
Alkaline Phosphatase: 96 U/L (ref 38–126)
Anion gap: 9 (ref 5–15)
BUN: 33 mg/dL — ABNORMAL HIGH (ref 8–23)
CO2: 20 mmol/L — ABNORMAL LOW (ref 22–32)
Calcium: 9.4 mg/dL (ref 8.9–10.3)
Chloride: 105 mmol/L (ref 98–111)
Creatinine, Ser: 1.54 mg/dL — ABNORMAL HIGH (ref 0.61–1.24)
GFR, Estimated: 50 mL/min — ABNORMAL LOW (ref >60)
Glucose, Bld: 225 mg/dL — ABNORMAL HIGH (ref 70–99)
Potassium: 4.3 mmol/L (ref 3.5–5.1)
Sodium: 134 mmol/L — ABNORMAL LOW (ref 135–145)
Total Bilirubin: 1.2 mg/dL (ref 0.3–1.2)
Total Protein: 7.5 g/dL (ref 6.5–8.1)

## 2021-05-07 LAB — CK: Total CK: 52 U/L (ref 49–397)

## 2021-05-07 LAB — D-DIMER, QUANTITATIVE: D-Dimer, Quant: 1.72 ug/mL-FEU — ABNORMAL HIGH (ref 0.00–0.50)

## 2021-05-07 MED ORDER — ACETAMINOPHEN 500 MG PO TABS
1000.0000 mg | ORAL_TABLET | Freq: Once | ORAL | Status: AC
  Administered 2021-05-07: 1000 mg via ORAL
  Filled 2021-05-07: qty 2

## 2021-05-07 NOTE — Progress Notes (Signed)
Established Patient Office Visit     This visit occurred during the SARS-CoV-2 public health emergency.  Safety protocols were in place, including screening questions prior to the visit, additional usage of staff PPE, and extensive cleaning of exam room while observing appropriate contact time as indicated for disinfecting solutions.    CC/Reason for Visit: Left arm weakness  HPI: [name] is a 65 y.o. male who is coming in today for the above mentioned reasons.  He has an extremely complex medical history significant for coronary artery disease with dilated cardiomyopathy, hypertension, hyperlipidemia, chronic combined heart failure, type 2 diabetes, rheumatoid arthritis and multiple myeloma.  It looks like sometime last night he noticed inability to move his left arm.  He is unclear about the time.  No matter how many times I asked him he cannot give me a timeframe however he clearly states that it was last night.  This morning he made an appointment to come see me.  He has significant weakness of his left arm.  His gait is already significantly impaired due to an acute RA flareup of his right knee.  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  His blood pressure is noted to be 170/100 today.  He is here with his wife.  Past Medical/Surgical History: Past Medical History:  Diagnosis Date   Anemia    Arthritis    "left knee" (11/24/2017)   CAD (coronary artery disease)    CABG 2002   Cancer [facility])    Multiple Myeloma   CHF (congestive heart failure) (HCC) 2021   Chronic kidney disease    Chronic renal insufficiency    Diabetes mellitus without complication (HCC)    Dilated cardiomyopathy (HCC)    echo in 2009 showed improvement with a normal EF   HTN (hypertension)    Hyperlipemia    Hypertension    Noncompliance    PAD  (peripheral artery disease) (HCC)    Pneumonia     Past Surgical History:  Procedure Laterality Date   CARDIAC CATHETERIZATION  2002, 2006   CORONARY ARTERY BYPASS GRAFT  2002   x4 DR. VAN TRIGT   IR FLUORO GUIDED NEEDLE PLC ASPIRATION/INJECTION LOC  10/24/2019   TRANSURETHRAL RESECTION OF PROSTATE N/A 01/06/2021   Procedure: CYSTOSCOPY WITH CYSTOGRAM/TRANSURETHRAL RESECTION OF THE PROSTATE (TURP);  Surgeon: Crista Elliot, MD;  Location: [facility];  Service: Urology;  Laterality: N/A;    Social History:  reports that he has never smoked. He has never used smokeless tobacco. He reports that he does not currently use alcohol. He reports that he does not currently use drugs.  Allergies: Allergies  Allergen Reactions   Metformin And Related Diarrhea    Family History:  Family History  Problem Relation Age of Onset   Hypertension Father    Diabetes Mother        DIABETIC COMA   Healthy Daughter      Current Outpatient Medications:    Accu-Chek Softclix Lancets lancets, Use Accu Chek softclix lancets to check blood sugar fasting or 2 hours after a meal every other day., Disp: 100 each, Rfl: 2   aspirin EC 81 MG tablet, Take 81 mg by mouth daily. Swallow whole., Disp: , Rfl:    atorvastatin (LIPITOR) 80 MG tablet, Take 1 tablet by mouth once daily (  Patient taking differently: Take 80 mg by mouth daily.), Disp: 90 tablet, Rfl: 3   Blood Glucose Monitoring Suppl (ACCU-CHEK GUIDE ME) w/Device KIT, 1 each by Does not apply route., Disp: , Rfl:    carvedilol (COREG) 25 MG tablet, Take 25 mg by mouth 2 (two) times daily with a meal., Disp: , Rfl:    ciprofloxacin-dexamethasone (CIPRODEX) OTIC suspension, Place 4 drops into the left ear 2 (two) times daily., Disp: 7.5 mL, Rfl: 0   erythromycin ophthalmic ointment, SMARTSIG:Sparingly Left Eye, Disp: , Rfl:    glucose blood (ACCU-CHEK GUIDE) test strip, Use Accu Chek test strips as instructed to check blood sugar fasting or two hours after  meals, every other day., Disp: 100 each, Rfl: 2   hydrALAZINE (APRESOLINE) 50 MG tablet, TAKE 1 TABLET BY MOUTH THREE TIMES DAILY (Patient taking differently: Take 50 mg by mouth 3 (three) times daily.), Disp: 270 tablet, Rfl: 3   hydroxypropyl methylcellulose / hypromellose (ISOPTO TEARS / GONIOVISC) 2.5 % ophthalmic solution, Place 2 drops into the left eye as needed for dry eyes., Disp: 15 mL, Rfl: 2   isosorbide mononitrate (IMDUR) 30 MG 24 hr tablet, Take 1 tablet (30 mg total) by mouth daily., Disp: 90 tablet, Rfl: 2   meclizine (ANTIVERT) 12.5 MG tablet, Take 1-2 tablets (12.5-25 mg total) by mouth 3 (three) times daily as needed for dizziness., Disp: 30 tablet, Rfl: 0   potassium chloride SA (KLOR-CON) 20 MEQ tablet, Take 1 tablet by mouth once daily (Patient taking differently: Take 20 mEq by mouth daily.), Disp: 30 tablet, Rfl: 3   predniSONE (DELTASONE) 5 MG tablet, Take 4 tabs po x 4 days, 3  tabs po x 4 days, 2  tabs po x 4 days, 1  tab po x 4 days, Disp: 40 tablet, Rfl: 0   prochlorperazine (COMPAZINE) 10 MG tablet, Take 1 tablet (10 mg total) by mouth every 6 (six) hours as needed (Nausea or vomiting)., Disp: 30 tablet, Rfl: 1   repaglinide (PRANDIN) 2 MG tablet, Take 1 tablet (2 mg total) by mouth 3 (three) times daily before meals., Disp: 90 tablet, Rfl: 2   tamsulosin (FLOMAX) 0.4 MG CAPS capsule, Take 1 capsule (0.4 mg total) by mouth daily., Disp: , Rfl:    amLODipine (NORVASC) 5 MG tablet, Take 1 tablet (5 mg total) by mouth daily., Disp: 180 tablet, Rfl: 3 No current facility-administered medications for this visit.  Facility-Administered Medications Ordered in Other Visits:    sodium chloride flush (NS) 0.9 % injection 10 mL, 10 mL, Intravenous, PRN, Irene Limbo, Cloria Spring, MD   sodium chloride flush (NS) 0.9 % injection 10 mL, 10 mL, Intracatheter, Once PRN, Brunetta Genera, MD  Review of Systems:  Constitutional: Denies fever, chills, diaphoresis, appetite change and  fatigue.  HEENT: Denies photophobia, eye pain, redness, hearing loss, ear pain, congestion, sore throat, rhinorrhea, sneezing, mouth sores, trouble swallowing, neck pain, neck stiffness and tinnitus.   Respiratory: Denies SOB, DOE, cough, chest tightness,  and wheezing.   Cardiovascular: Denies chest pain, palpitations and leg swelling.  Gastrointestinal: Denies nausea, vomiting, abdominal pain, diarrhea, constipation, blood in stool and abdominal distention.  Genitourinary: Denies dysuria, urgency, frequency, hematuria, flank pain and difficulty urinating.  Endocrine: Denies: hot or cold intolerance, sweats, changes in hair or nails, polyuria, polydipsia. Musculoskeletal: Denies myalgias, back pain, joint swelling, arthralgias and gait problem.  Skin: Denies pallor, rash and wound.   Hematological: Denies adenopathy. Easy bruising, personal or family bleeding history  Psychiatric/Behavioral:  Denies suicidal ideation, mood changes, confusion, nervousness, sleep disturbance and agitation    Physical Exam: Vitals:   05/07/21 1107  BP: (!) 170/100  Pulse: 76  Temp: 98.4 F (36.9 C)  TempSrc: Oral  SpO2: 99%  Weight: 174 lb 14.4 oz (79.3 kg)  Height: 5' 9.5" (1.765 m)    Body mass index is 25.46 kg/m.   Constitutional: NAD, calm, comfortable Eyes: Wears corrective lenses, inability to fully close left eyelid, left mouth droopiness, these findings are from previously diagnosed Bell's palsy. Respiratory: clear to auscultation bilaterally, no wheezing, no crackles. Normal respiratory effort. No accessory muscle use.  Cardiovascular: Regular rate and rhythm, no murmurs / rubs / gallops. No extremity edema.  Neurologic: Left-sided Bell's palsy noted, significant left arm weakness that is new Psychiatric:Alert and oriented x 3. Normal mood.    Impression and Plan:  Left arm weakness -I am extremely concerned for an acute CVA. -Unfortunately, as these events occurred last night, he is  outside the window for tPA and as such code stroke/EMS services have not been deployed. -I have advised wife to take him urgently to the emergency department as he will need an acute stroke work-up. -He will also need acute blood pressure management.  Time spent: 31 minutes reviewing chart, interviewing and examining patient and formulating plan of care.     Lelon Frohlich, MD [facility]

## 2021-05-07 NOTE — ED Provider Notes (Signed)
[place] DEPT Provider Note   CSN: [id number] Arrival date & time: 05/07/21  1203     History Chief Complaint  Patient presents with   Arm Injury    [name] is a 65 y.o. male with PMH CAD status post CABG, HTN, dilated cardiomyopathy, severe rheumatoid arthritis, recent presentation to the emergency department for malignant otitis externa with associated Bell's palsy who presents the emergency department for evaluation of left arm weakness.  Patient states that he awoke this morning with significant left arm pain limiting function and saw his primary care physician who sent the patient to the emergency department due to concern for stroke.  On chart review of the office visit, it appears the patient was completely unable to move the left upper extremity.  Here in the emergency department, the patient states his symptoms are improving and he is able to move the extremity but with difficulty.  Patient states that this is secondary to pain.  Patient denies chest pain, shortness of breath, abdominal pain, nausea, vomiting, fever or other systemic symptoms   Arm Injury Associated symptoms: no back pain and no fever       Past Medical History:  Diagnosis Date   Anemia    Arthritis    "left knee" (11/24/2017)   CAD (coronary artery disease)    CABG 2002   Cancer [facility])    Multiple Myeloma   CHF (congestive heart failure) ([place]) 2021   Chronic kidney disease    Chronic renal insufficiency    Diabetes mellitus without complication ([place])    Dilated cardiomyopathy ([place])    echo in 2009 showed improvement with a normal EF   HTN (hypertension)    Hyperlipemia    Hypertension    Noncompliance    PAD (peripheral artery disease) ([place])    Pneumonia     Patient Active Problem List   Diagnosis Date Noted   S/P TURP 01/06/2021   Pressure injury of skin 10/09/2020   Bladder mass    Failure to thrive in adult 10/07/2020   Flu vaccine need  06/04/2020   Iron deficiency anemia 06/04/2020   Rheumatoid arthritis involving multiple sites ([place]) 04/17/2020   Aspiration pneumonia ([place]) 03/13/2020   Sepsis ([place]) 03/13/2020   Pulmonary embolism ([place]) 03/13/2020   AKI (acute kidney injury) ([place]) 03/13/2020   Anemia 03/13/2020   Multiple myeloma ([place]) 03/13/2020   Hypertension 03/13/2020   Acute hypoxemic respiratory failure ([place]) 03/13/2020   Multiple myeloma ([place]) 11/06/2019   Counseling regarding advance care planning and goals of care 11/06/2019   Uncontrolled type 2 diabetes mellitus with hyperglycemia ([place]) 09/19/2018   Acute on chronic combined systolic and diastolic CHF (congestive heart failure) ([place]) 11/26/2017   Hyperlipemia 01/07/2011   CAD (coronary artery disease)    Dilated cardiomyopathy (HCC)    HTN (hypertension)    PAD (peripheral artery disease) ([place])    Chronic renal insufficiency     Past Surgical History:  Procedure Laterality Date   CARDIAC CATHETERIZATION  2002, 2006   CORONARY ARTERY BYPASS GRAFT  2002   x4 DR. VAN TRIGT   IR FLUORO GUIDED NEEDLE PLC ASPIRATION/INJECTION LOC  10/24/2019   TRANSURETHRAL RESECTION OF PROSTATE N/A 01/06/2021   Procedure: CYSTOSCOPY WITH CYSTOGRAM/TRANSURETHRAL RESECTION OF THE PROSTATE (TURP);  Surgeon: Lucas Mallow, MD;  Location: [facility];  Service: Urology;  Laterality: N/A;       Family History  Problem Relation Age of Onset   Hypertension Father  Diabetes Mother        DIABETIC COMA   Healthy Daughter     Social History   Tobacco Use   Smoking status: Never   Smokeless tobacco: Never  Vaping Use   Vaping Use: Never used  Substance Use Topics   Alcohol use: Not Currently   Drug use: Not Currently    Home Medications Prior to Admission medications   Medication Sig Start Date End Date Taking? Authorizing Provider  Accu-Chek Softclix Lancets lancets Use Accu Chek softclix lancets to check blood sugar fasting or 2 hours after a meal every other  day. 11/23/19   Elayne Snare, MD  amLODipine (NORVASC) 5 MG tablet Take 1 tablet (5 mg total) by mouth daily. 12/27/20 04/02/21  [place], Peter M, MD  aspirin EC 81 MG tablet Take 81 mg by mouth daily. Swallow whole.    [provider]  atorvastatin (LIPITOR) 80 MG tablet Take 1 tablet by mouth once daily Patient taking differently: Take 80 mg by mouth daily. 06/25/20   [place], Peter M, MD  Blood Glucose Monitoring Suppl (ACCU-CHEK GUIDE ME) w/Device KIT 1 each by Does not apply route.    [provider]  carvedilol (COREG) 25 MG tablet Take 25 mg by mouth 2 (two) times daily with a meal.    [provider]  ciprofloxacin-dexamethasone (CIPRODEX) OTIC suspension Place 4 drops into the left ear 2 (two) times daily. 03/14/21   Isaac Bliss, Rayford Halsted, MD  erythromycin ophthalmic ointment SMARTSIG:Sparingly Left Eye 04/23/21   [provider]  glucose blood (ACCU-CHEK GUIDE) test strip Use Accu Chek test strips as instructed to check blood sugar fasting or two hours after meals, every other day. 08/18/19   Elayne Snare, MD  hydrALAZINE (APRESOLINE) 50 MG tablet TAKE 1 TABLET BY MOUTH THREE TIMES DAILY Patient taking differently: Take 50 mg by mouth 3 (three) times daily. 06/25/20   [place], Peter M, MD  hydroxypropyl methylcellulose / hypromellose (ISOPTO TEARS / GONIOVISC) 2.5 % ophthalmic solution Place 2 drops into the left eye as needed for dry eyes. 03/08/21   Margarita Mail, PA-C  isosorbide mononitrate (IMDUR) 30 MG 24 hr tablet Take 1 tablet (30 mg total) by mouth daily. 01/08/21   [place], Peter M, MD  meclizine (ANTIVERT) 12.5 MG tablet Take 1-2 tablets (12.5-25 mg total) by mouth 3 (three) times daily as needed for dizziness. 03/08/21   Margarita Mail, PA-C  potassium chloride SA (KLOR-CON) 20 MEQ tablet Take 1 tablet by mouth once daily Patient taking differently: Take 20 mEq by mouth daily. 05/13/20   Elayne Snare, MD  predniSONE (DELTASONE) 5 MG tablet Take 4 tabs  po x 4 days, 3  tabs po x 4 days, 2  tabs po x 4 days, 1  tab po x 4 days 04/11/21   Bo Merino, MD  prochlorperazine (COMPAZINE) 10 MG tablet Take 1 tablet (10 mg total) by mouth every 6 (six) hours as needed (Nausea or vomiting). 11/06/19   Brunetta Genera, MD  repaglinide (PRANDIN) 2 MG tablet Take 1 tablet (2 mg total) by mouth 3 (three) times daily before meals. 02/13/21   Elayne Snare, MD  tamsulosin (FLOMAX) 0.4 MG CAPS capsule Take 1 capsule (0.4 mg total) by mouth daily. 10/12/20   Patrecia Pour, MD    Allergies    Metformin and related  Review of Systems   Review of Systems  Constitutional:  Negative for chills and fever.  HENT:  Negative for ear pain  and sore throat.   Eyes:  Negative for pain and visual disturbance.  Respiratory:  Negative for cough and shortness of breath.   Cardiovascular:  Negative for chest pain and palpitations.  Gastrointestinal:  Negative for abdominal pain and vomiting.  Genitourinary:  Negative for dysuria and hematuria.  Musculoskeletal:  Positive for arthralgias and myalgias. Negative for back pain.  Skin:  Negative for color change and rash.  Neurological:  Positive for weakness. Negative for seizures and syncope.  All other systems reviewed and are negative.  Physical Exam Updated Vital Signs BP (!) 147/67 (BP Location: Right Arm)   Pulse (!) 54   Temp 98.3 F (36.8 C) (Oral)   Resp 16   SpO2 99%   Physical Exam Vitals and nursing note reviewed.  Constitutional:      Appearance: He is well-developed.  HENT:     Head: Normocephalic and atraumatic.  Eyes:     Conjunctiva/sclera: Conjunctivae normal.  Cardiovascular:     Rate and Rhythm: Normal rate and regular rhythm.     Heart sounds: No murmur heard. Pulmonary:     Effort: Pulmonary effort is normal. No respiratory distress.     Breath sounds: Normal breath sounds.  Abdominal:     Palpations: Abdomen is soft.     Tenderness: There is no abdominal tenderness.   Musculoskeletal:        General: Tenderness (L brachioradialis) present.     Cervical back: Neck supple.     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  Neurological:     Mental Status: He is alert and oriented to person, place, and time.     Cranial Nerves: No cranial nerve deficit.     Sensory: No sensory deficit.     Motor: Weakness (LUE) present.    ED Results / Procedures / Treatments   Labs (all labs ordered are listed, but only abnormal results are displayed) Labs Reviewed - No data to display  EKG None  Radiology DG Elbow Complete Left  Result Date: 05/07/2021 CLINICAL DATA:  Pain beginning last night EXAM: LEFT ELBOW - COMPLETE 3+ VIEW COMPARISON:  None. 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 Evidence of chronic flexor and extensor tendinopathy. Regional arterial calcification. 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 Electronically Signed   By: Nelson Chimes M.D.   On: 05/07/2021 13:39   DG Knee Complete 4 Views Right  Result Date: 05/07/2021 CLINICAL DATA:  Right knee pain. EXAM: RIGHT KNEE - COMPLETE 4+ VIEW COMPARISON:  None. FINDINGS: No evidence of fracture, dislocation, or joint effusion. Moderate narrowing of the medial and lateral joint spaces are noted. Moderate patellar spurring is noted. Soft tissues are unremarkable. IMPRESSION: Moderate degenerative joint disease.  No acute abnormality seen Electronically Signed   By: Marijo Conception M.D.   On: 05/07/2021 13:40    Procedures Procedures   Medications Ordered in ED Medications - No data to display  ED Course  I have reviewed the triage vital signs and the nursing notes.  Pertinent labs & imaging results that were available during my care of the patient were reviewed by me and considered in my medical decision making (see chart for  details).    MDM Rules/Calculators/A&P  Patient seen the emergency department for evaluation of left arm weakness and pain.  Physical exam reveals limited range of motion of the left upper extremity that appears to be limited by pain at the insertion point of the brachial radialis muscle on the left but is improved compared to his exam at his primary care office this morning.  Physical exam also reveals what appears to be a healing malignant otitis externa on the left.  Laboratory evaluation reveals hyponatremia 134, glucose 225, creatinine 1.54 which is the patient's baseline.  CK unremarkable.  Dimer elevated to 1.72.  X-ray of the elbow reveals a large gouty tophus and chronic arthritic and chronic tendinopathy changes.  X-ray of the knee shows osteoarthritic changes but is otherwise unremarkable.  CT head with no evidence of stroke.  An ultrasound was performed of the left upper extremity to rule out DVT which was negative for DVT.  Patient given ibuprofen and on reevaluation has improved range of motion of the left upper extremity.  I have low suspicion for stroke at this time as the patient has point tenderness at the level of the brachial radialis insertion point and his weakness appears to be limited by pain.  At this time he is safe for discharge with PCP follow-up.  His pain may also be explained by rheumatoid arthritis flare and the patient states that he will take his home prednisone and monitor for improvement.  Patient then discharged.  Final Clinical Impression(s) / ED Diagnoses Final diagnoses:  None    Rx / DC Orders ED Discharge Orders     None        Jaquisha Frech, Debe Coder, MD 05/07/21 [phone]

## 2021-05-07 NOTE — Progress Notes (Signed)
Left upper extremity venous duplex has been completed. Preliminary results can be found in CV Proc through chart review.  Results were given to Dr. Matilde Sprang.  05/07/21 4:39 PM [name] RVT

## 2021-05-07 NOTE — ED Notes (Signed)
An After Visit Summary was printed and given to the patient. Discharge instructions given and no further questions at this time.  

## 2021-05-07 NOTE — ED Triage Notes (Signed)
Pt arrived via POV, c/o left arm pain, states he is unable to lift arm due to pain. Grip strengths equal. Sensation intact. Denies any of sx. States started last night. Denies any trauma.

## 2021-05-07 NOTE — ED Provider Notes (Signed)
Emergency Medicine Provider Triage Evaluation Note  [name] , a 65 y.o. male  was evaluated in triage.  Pt complains of left arm pain.  Began last night.  In the distal upper arm and proximal forearm.  He states this is preventing him from lifting his arm like normal.  No fall, trauma, or injury.  No numbness.  Recent history of Bell's palsy affecting the left side of the face.  Review of Systems  Positive: L arm pain Negative: numbness  Physical Exam  BP (!) 147/67 (BP Location: Right Arm)   Pulse (!) 54   Temp 98.3 F (36.8 C) (Oral)   Resp 16   SpO2 99%  Gen:   Awake, no distress   Resp:  Normal effort  MSK/neuro: When attempting to flex at the elbow or abduct the arm, patient unable to do so which she reports is due to pain.  Using his shoulder muscles and assistance.  Unable to check pronator drift due to inability to hold arm extended out.  Grip strength equal.  Radial pulses equal.  Sensation of the extremities equal.  Left-sided facial droop, which wife states is baseline and unchanged. No ttp of midline c-spine   Medical Decision Making  Medically screening exam initiated at 12:30 PM.  Appropriate orders placed.  [name] was informed that the remainder of the evaluation will be completed by another provider, this initial triage assessment does not replace that evaluation, and the importance of remaining in the ED until their evaluation is complete.  Is discussed with attending, Dr. [name] evaluated the patient.  On reevaluation, patient has significantly improved movement, able to flex at the elbow, has good supination and pronation strength.  Improved abduction.  As weakness was mostly pain related, and pain and movement is improving, low suspicion for stroke.  X-ray of the elbow obtained.   Franchot Heidelberg, PA-C 05/07/21 1245    Teressa Lower, MD 05/07/21 [phone]

## 2021-05-07 NOTE — Discharge Instructions (Addendum)
You were seen in the emergency department for evaluation of left arm pain and weakness.  Your lab work-up here in the emergency department was reassuring and x-ray imaging of your arm and your knee did show evidence of arthritis but did not show any evidence of fracture.  A CT of your head was performed to rule out stroke due to some new weakness in that arm, and there was no evidence of stroke in  this CT.  An ultrasound was also performed in your arm to rule out a blood clot which was reassuringly negative for a blood clot.  At this time, you are safe for discharge, but please follow-up with your primary care physician to ensure that your arm pain continues to improve.  You are free to take your home prednisone and I encourage you to take Tylenol for the arm pain.  Please return to the emergency department if you have new or worsening weakness, numbness, tingling, fevers or any other concerning symptoms.

## 2021-05-13 DIAGNOSIS — C61 Malignant neoplasm of prostate: Secondary | ICD-10-CM

## 2021-05-13 HISTORY — DX: Malignant neoplasm of prostate: C61

## 2021-05-22 ENCOUNTER — Other Ambulatory Visit: Payer: Self-pay

## 2021-05-22 ENCOUNTER — Ambulatory Visit (INDEPENDENT_AMBULATORY_CARE_PROVIDER_SITE_OTHER): Payer: Medicare Other | Admitting: Endocrinology

## 2021-05-22 ENCOUNTER — Encounter: Payer: Self-pay | Admitting: Endocrinology

## 2021-05-22 VITALS — BP 150/72 | HR 89 | Ht 69.5 in | Wt 173.6 lb

## 2021-05-22 DIAGNOSIS — I25810 Atherosclerosis of coronary artery bypass graft(s) without angina pectoris: Secondary | ICD-10-CM

## 2021-05-22 DIAGNOSIS — E1165 Type 2 diabetes mellitus with hyperglycemia: Secondary | ICD-10-CM | POA: Diagnosis not present

## 2021-05-22 LAB — POCT GLYCOSYLATED HEMOGLOBIN (HGB A1C): Hemoglobin A1C: 9.7 % — AB (ref 4.0–5.6)

## 2021-05-22 MED ORDER — EMPAGLIFLOZIN 10 MG PO TABS: 10.0000 mg | ORAL_TABLET | Freq: Every day | ORAL | 1 refills | Status: DC

## 2021-05-22 NOTE — Progress Notes (Signed)
Patient ID: [name], male   DOB: December 17, 1955, 65 y.o.   MRN: [id number]           Reason for Appointment: Follow-up for Type 2 Diabetes   History of Present Illness:          Date of diagnosis of type 2 diabetes mellitus:   2018      Background history:  He has never been told to have diabetes but review of his previous labs indicate that he had high blood sugar 179 in 2014 His highest sugar has been 234 in 10/2016 when his A1c was 8.8, not clear if he was symptomatic at that time  Recent history:   A1c is higher as expected at 9.7 compared to 8.5 No fructosamine available   Non-insulin hypoglycemic drugs the patient is taking are:   Prandin 2 mg at meals  Current management, blood sugar patterns and problems identified:  He did bring his monitor for download He has checked his blood sugars again sporadically and mostly in the mornings He is checking some readings 2 hours after breakfast also these appear to be usually  Also his lab glucose recently in the ER in the afternoon was over 200  He thinks he is trying to take his Prandin consistently before his meals  Does not have excessive carbohydrate intake at breakfast with only 2 slices of bread along with his [place] sausage and eggs  However he thinks he is able to practically eliminated regular soft drinks which she was doing before  No hypoglycemia reported His weight is about 7 or 8 pounds lower than last month and not clear why Not able to do any exercise          Not able to take metformin because of renal function abnormalities  Glucose monitoring:   has Accu-Chek guide meter  Blood sugars from download, glucose range only the last 3 weeks  PRE-MEAL Fasting Lunch Dinner Bedtime Overall  Glucose range: 95-161    95-262  Mean/median:     180   POST-MEAL PC Breakfast PC Lunch PC Dinner  Glucose range: 186-249    Mean/median:       Previous FASTING averaging 144 range 102-218  Dietician visit, most  recent: 2/21  Weight history:  Wt Readings from Last 3 Encounters:  05/22/21 173 lb 9.6 oz (78.7 kg)  05/07/21 174 lb 14.4 oz (79.3 kg)  04/28/21 190 lb 12.8 oz (86.5 kg)    Glycemic control:   Lab Results  Component Value Date   HGBA1C 9.7 (A) 05/22/2021   HGBA1C 8.5 (H) 02/10/2021   HGBA1C 6.6 (H) 01/02/2021   Lab Results  Component Value Date   MICROALBUR 12.6 (H) 07/21/2018   LDLCALC 39 09/04/2019   CREATININE 1.54 (H) 05/07/2021   Lab Results  Component Value Date   MICRALBCREAT 10.0 07/21/2018    Lab Results  Component Value Date   FRUCTOSAMINE 347 (H) 04/14/2021   FRUCTOSAMINE 237 11/08/2020   FRUCTOSAMINE 327 (H) 05/28/2020   Lab Results  Component Value Date   HGB 12.9 (L) 05/07/2021    Office Visit on 05/22/2021  Component Date Value Ref Range Status   Hemoglobin A1C 05/22/2021 9.7 (A)  4.0 - 5.6 % Final    Allergies as of 05/22/2021       Reactions   Metformin And Related Diarrhea        Medication List        Accurate as of May 22, 2021 12:10  PM. If you have any questions, ask your nurse or doctor.          Accu-Chek Guide Me w/Device Kit 1 each by Does not apply route.   Accu-Chek Guide test strip Generic drug: glucose blood Use Accu Chek test strips as instructed to check blood sugar fasting or two hours after meals, every other day.   Accu-Chek Softclix Lancets lancets Use Accu Chek softclix lancets to check blood sugar fasting or 2 hours after a meal every other day.   amLODipine 5 MG tablet Commonly known as: NORVASC Take 1 tablet (5 mg total) by mouth daily.   aspirin EC 81 MG tablet Take 81 mg by mouth daily. Swallow whole.   atorvastatin 80 MG tablet Commonly known as: LIPITOR Take 1 tablet by mouth once daily   carvedilol 25 MG tablet Commonly known as: COREG Take 25 mg by mouth 2 (two) times daily with a meal.   ciprofloxacin-dexamethasone OTIC suspension Commonly known as: CIPRODEX Place 4 drops into  the left ear 2 (two) times daily.   empagliflozin 10 MG Tabs tablet Commonly known as: Jardiance Take 1 tablet (10 mg total) by mouth daily. Started by: Elayne Snare, MD   erythromycin ophthalmic ointment SMARTSIG:Sparingly Left Eye   hydrALAZINE 50 MG tablet Commonly known as: APRESOLINE TAKE 1 TABLET BY MOUTH THREE TIMES DAILY   hydroxypropyl methylcellulose / hypromellose 2.5 % ophthalmic solution Commonly known as: ISOPTO TEARS / GONIOVISC Place 2 drops into the left eye as needed for dry eyes.   isosorbide mononitrate 30 MG 24 hr tablet Commonly known as: IMDUR Take 1 tablet (30 mg total) by mouth daily.   meclizine 12.5 MG tablet Commonly known as: ANTIVERT Take 1-2 tablets (12.5-25 mg total) by mouth 3 (three) times daily as needed for dizziness.   potassium chloride SA 20 MEQ tablet Commonly known as: KLOR-CON Take 1 tablet by mouth once daily   predniSONE 5 MG tablet Commonly known as: DELTASONE Take 4 tabs po x 4 days, 3  tabs po x 4 days, 2  tabs po x 4 days, 1  tab po x 4 days   prochlorperazine 10 MG tablet Commonly known as: COMPAZINE Take 1 tablet (10 mg total) by mouth every 6 (six) hours as needed (Nausea or vomiting).   repaglinide 2 MG tablet Commonly known as: Prandin Take 1 tablet (2 mg total) by mouth 3 (three) times daily before meals.   tamsulosin 0.4 MG Caps capsule Commonly known as: FLOMAX Take 1 capsule (0.4 mg total) by mouth daily.        Allergies:  Allergies  Allergen Reactions   Metformin And Related Diarrhea    Past Medical History:  Diagnosis Date   Anemia    Arthritis    "left knee" (11/24/2017)   CAD (coronary artery disease)    CABG 2002   Cancer [facility])    Multiple Myeloma   CHF (congestive heart failure) ([place]) 2021   Chronic kidney disease    Chronic renal insufficiency    Diabetes mellitus without complication ([place])    Dilated cardiomyopathy ([place])    echo in 2009 showed improvement with a normal EF   HTN  (hypertension)    Hyperlipemia    Hypertension    Noncompliance    PAD (peripheral artery disease) ([place])    Pneumonia     Past Surgical History:  Procedure Laterality Date   CARDIAC CATHETERIZATION  2002, 2006   CORONARY ARTERY BYPASS GRAFT  2002  x4 DR. Lucianne Lei TRIGT   IR FLUORO GUIDED NEEDLE PLC ASPIRATION/INJECTION LOC  10/24/2019   TRANSURETHRAL RESECTION OF PROSTATE N/A 01/06/2021   Procedure: CYSTOSCOPY WITH CYSTOGRAM/TRANSURETHRAL RESECTION OF THE PROSTATE (TURP);  Surgeon: Lucas Mallow, MD;  Location: [facility];  Service: Urology;  Laterality: N/A;    Family History  Problem Relation Age of Onset   Hypertension Father    Diabetes Mother        DIABETIC COMA   Healthy Daughter     Social History:  reports that he has never smoked. He has never used smokeless tobacco. He reports that he does not currently use alcohol. He reports that he does not currently use drugs.   Review of Systems   Lipid history: On atorvastatin 80 mg from his cardiologist since diagnosis of CAD, labs as follows    Lab Results  Component Value Date   CHOL 92 (L) 09/04/2019   HDL 35 (L) 09/04/2019   LDLCALC 39 09/04/2019   LDLDIRECT 61.0 11/08/2020   TRIG 89 09/04/2019   CHOLHDL 2.6 09/04/2019           Hypertension: Has been followed by cardiologist for blood pressure management Blood pressure is frequently high However blood pressure done on the second attempt today later in the office visit was improving He says he is taking his medication regularly  Aldosterone level has been checked for his history of hypokalemia and is normal  Recent readings:  BP Readings from Last 3 Encounters:  05/22/21 (!) 150/72  05/07/21 (!) 141/79  05/07/21 (!) 170/100   RENAL dysfunction:  Renal dysfunction not related to diabetes Has variable levels as follows No history of proteinuria  Lab Results  Component Value Date   CREATININE 1.54 (H) 05/07/2021   CREATININE 1.56 (H) 04/14/2021    CREATININE 1.69 (H) 03/08/2021   Lab Results  Component Value Date   K 4.3 05/07/2021    Most recent eye exam was in 2018  Most recent foot exam: 07/2018   LABS:  Office Visit on 05/22/2021  Component Date Value Ref Range Status   Hemoglobin A1C 05/22/2021 9.7 (A)  4.0 - 5.6 % Final    Physical Examination:  BP (!) 150/72   Pulse 89   Ht 5' 9.5" (1.765 m)   Wt 173 lb 9.6 oz (78.7 kg)   SpO2 99%   BMI 25.27 kg/m   No ankle edema present       ASSESSMENT:  Diabetes type 2, mild  See history of present illness for detailed discussion of current diabetes management, blood sugar patterns and problems identified  A1c is recently higher but may be reflecting his high readings when he was on steroids   Most recently his blood sugars are generally improving although checking primarily fasting readings  Currently only on repaglinide AC Also has renal dysfunction As above blood pressure appears to be significantly high at times  PLAN:      He will continue to take Repeglanide 2 tablets of the 2 mg before each meal  Also since he still has high postprandial readings and has chronic kidney disease we will add an SGLT2 drug Currently Jardiance appears to be better covered than [place]  Discussed action of SGLT 2 drugs on lowering glucose by decreasing kidney absorption of glucose, benefits of weight loss and lower blood pressure, possible side effects including candidiasis and dosage regimen  This should help with his blood pressure also He will let us know if he  has any unusually high or low readings However emphasized the need to check readings after meals in the afternoon or evening regularly and less fasting  Patient Instructions  Check blood sugars on waking up 1-2 days a week  Also check blood sugars about 2 hours after meals and do this after different meals by rotation  Recommended blood sugar levels on waking up are 90-130 and about 2 hours after meal is  130-180  Please bring your blood sugar monitor to each visit, thank you  Take Jardiance in am     Elayne Snare 05/22/2021, 12:10 PM   Note: This office note was prepared with Dragon voice recognition system technology. Any transcriptional errors that result from this process are unintentional.

## 2021-05-22 NOTE — Patient Instructions (Signed)
Check blood sugars on waking up 1-2 days a week  Also check blood sugars about 2 hours after meals and do this after different meals by rotation  Recommended blood sugar levels on waking up are 90-130 and about 2 hours after meal is 130-180  Please bring your blood sugar monitor to each visit, thank you  Take Jardiance in am

## 2021-05-30 ENCOUNTER — Telehealth: Payer: Self-pay | Admitting: Cardiology

## 2021-05-30 ENCOUNTER — Other Ambulatory Visit: Payer: Self-pay

## 2021-05-30 MED ORDER — CARVEDILOL 25 MG PO TABS: 25.0000 mg | ORAL_TABLET | Freq: Two times a day (BID) | ORAL | 3 refills | Status: DC

## 2021-05-30 NOTE — Telephone Encounter (Signed)
[facility] is requesting a refill on Carvedilol 25 mg tablet. Please address

## 2021-05-30 NOTE — Telephone Encounter (Signed)
*  STAT* If patient is at the pharmacy, call can be transferred to refill team.   1. Which medications need to be refilled? (please list name of each medication and dose if known) carvedilol (COREG) 25 MG tablet  2. Which pharmacy/location (including street and city if local pharmacy) is medication to be sent to? [facility] ([place]), [place] - [address]  3. Do they need a 30 day or 90 day supply? 90 ds

## 2021-06-02 ENCOUNTER — Other Ambulatory Visit: Payer: Self-pay

## 2021-06-03 MED ORDER — CARVEDILOL 25 MG PO TABS: 25.0000 mg | ORAL_TABLET | Freq: Two times a day (BID) | ORAL | 3 refills | Status: DC

## 2021-06-20 ENCOUNTER — Other Ambulatory Visit (HOSPITAL_COMMUNITY): Payer: Self-pay | Admitting: Urology

## 2021-06-20 DIAGNOSIS — C61 Malignant neoplasm of prostate: Secondary | ICD-10-CM

## 2021-06-24 NOTE — Progress Notes (Signed)
I called pt to introduce myself as the Prostate Nurse Navigator and the Coordinator of the [facility]state [place].   1. I confirmed with the patient he is aware of his referral to the clinic 12/27, arriving @ 12:30 pm.    2. I discussed the format of the clinic and the physicians he will be seeing that day.   3. I discussed where the clinic is located and how to contact me.   4. I confirmed his address and informed him I would be mailing a packet of information and forms to be completed. I asked him to bring them with him the day of his appointment.    He voiced understanding of the above. I asked him to call me if he has any questions or concerns regarding his appointments or the forms he needs to complete.

## 2021-07-02 ENCOUNTER — Other Ambulatory Visit: Payer: Self-pay

## 2021-07-02 ENCOUNTER — Encounter: Payer: Self-pay | Admitting: Hematology

## 2021-07-02 ENCOUNTER — Ambulatory Visit (HOSPITAL_COMMUNITY)
Admission: RE | Admit: 2021-07-02 | Discharge: 2021-07-02 | Disposition: A | Discharge status: Home / Self Care | Payer: Medicare Other | Source: Ambulatory Visit | Attending: Urology | Admitting: Urology

## 2021-07-02 DIAGNOSIS — C61 Malignant neoplasm of prostate: Secondary | ICD-10-CM | POA: Diagnosis not present

## 2021-07-02 MED ORDER — PIFLIFOLASTAT F 18 (PYLARIFY) INJECTION
9.0000 | Freq: Once | INTRAVENOUS | Status: AC
  Administered 2021-07-02: 15:00:00 9.9 via INTRAVENOUS

## 2021-07-04 NOTE — Progress Notes (Signed)
° °                              Care Plan Summary  Name: [name] DOB: [birth year]   Your Medical Team:   Urologist -  Dr. Raynelle Bring, [facility]  Radiation Oncologist - Dr. Tyler Pita, [facility]   Medical Oncologist - Dr. Zola Button, [place]  Recommendations: 1) Long term ADT (Androgen Deprivation Therapy)  2) Radiation     * These recommendations are based on information available as of todays consult.      Recommendations may change depending on the results of further tests or exams.    Next Steps: 1) [facility] will contact you to start your ADT.  2) [facility] will contact you to start radiation.  Radiation will start approximately 2 months after your ADT.    When appointments need to be scheduled, you will be contacted by [facility] and/or [facility].  Questions?  Please do not hesitate to call [name], BSN, RN at [phone] with any questions or concerns.  [name] is your Oncology Nurse Navigator and is available to assist you while youre receiving your medical care at [facility].

## 2021-07-08 ENCOUNTER — Other Ambulatory Visit: Payer: Self-pay

## 2021-07-08 ENCOUNTER — Inpatient Hospital Stay: Payer: Medicare Other | Attending: Oncology | Admitting: Oncology

## 2021-07-08 ENCOUNTER — Encounter: Payer: Self-pay | Admitting: Genetic Counselor

## 2021-07-08 ENCOUNTER — Ambulatory Visit
Admission: RE | Admit: 2021-07-08 | Discharge: 2021-07-08 | Disposition: A | Discharge status: Home / Self Care | Payer: Medicare Other | Source: Ambulatory Visit | Attending: Radiation Oncology | Admitting: Radiation Oncology

## 2021-07-08 DIAGNOSIS — I130 Hypertensive heart and chronic kidney disease with heart failure and stage 1 through stage 4 chronic kidney disease, or unspecified chronic kidney disease: Secondary | ICD-10-CM | POA: Insufficient documentation

## 2021-07-08 DIAGNOSIS — I509 Heart failure, unspecified: Secondary | ICD-10-CM | POA: Diagnosis not present

## 2021-07-08 DIAGNOSIS — E1122 Type 2 diabetes mellitus with diabetic chronic kidney disease: Secondary | ICD-10-CM | POA: Insufficient documentation

## 2021-07-08 DIAGNOSIS — Z1379 Encounter for other screening for genetic and chromosomal anomalies: Secondary | ICD-10-CM | POA: Insufficient documentation

## 2021-07-08 DIAGNOSIS — N189 Chronic kidney disease, unspecified: Secondary | ICD-10-CM | POA: Diagnosis not present

## 2021-07-08 DIAGNOSIS — C61 Malignant neoplasm of prostate: Secondary | ICD-10-CM | POA: Diagnosis not present

## 2021-07-08 DIAGNOSIS — C9000 Multiple myeloma not having achieved remission: Secondary | ICD-10-CM | POA: Diagnosis not present

## 2021-07-08 NOTE — Progress Notes (Incomplete)
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

## 2021-07-08 NOTE — Progress Notes (Signed)
Radiation Oncology         [phone] ________________________________  Multidisciplinary Prostate Cancer Clinic  Initial Radiation Oncology Consultation  Name: [name] MRN: [id number]  Date: 07/08/2021  DOB: 01/18/1956  DV:[id number] Joseph Beals, MD  Lucas Mallow, MD   REFERRING PHYSICIAN: Lucas Mallow, MD  DIAGNOSIS: 65 y.o. 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 Prostate cancer [facility])  C61       HISTORY OF PRESENT ILLNESS::[name] is a 65 y.o. gentleman with a history of multiple myeloma, currently in remission and managed by Dr. Irene Limbo. He initially was referred to the ED on 10/07/20 by Dr. Irene Limbo with abdominal pain, fever, dehydration, and shortness of breath. He underwent CT A/P at that time showing severe bilateral hydroureteronephrosis to the level of an irregularly-thickened, lobular urinary bladder with marked irregular mural thickening as well as more mass-like extension from bladder dome which closely apposes several adjacent loops of thickened small bowel and colon with loss of a discernible fat plane.  He was referred to Dr. Gloriann Loan for outpatient evaluation and underwent in-office cystoscopy on 11/14/20 showing an obstructing prostate but no obvious bladder diverticulum or mass.  An MRI A/P was performed on 11/26/20 showing a 5.5 cm, rounded fluid-filled structure abutting the bladder dome, containing a small focus of air, likely reflecting a large bladder diverticulum and was substantially decreased in size compared to prior CT with no evidence of discrete bladder mass, other mass, lymphadenopathy, or metastatic disease.  There was prostatomegaly with median lobe hypertrophy, felt most likely to be the etiology of outlet obstruction.   He was unable to pass a voiding trial so he proceeded to TURP on 01/06/21.  Surgical pathology revealed prostatic  adenocarcinoma, Gleason 3+4 involving 30% of the submitted tissue.  A PSA was checked in 03/2021 after his TURP and was noted to be elevated at 14.4 and a repeat PSA in 04/2021 remained elevated at 12.2. The patient proceeded to transrectal ultrasound with 12 biopsies of the prostate on 05/20/21.  The prostate volume measured 20.24 cc.  Out of 12 core biopsies, 8 were positive, including all left-sided cores.  The maximum Gleason score was 4+5, and this was seen in the left mid. Additionally, Gleason 4+4 was seen in the left base and right base (small focus), Gleason 4+3 in the left base lateral, left mid lateral, and left apex lateral, Gleason 3+4 in the left apex, and Gleason 3+3 in the right base lateral (small focus).  A PSMA PET scan was performed on 07/02/21 for disease staging and showed signs of activity in prostate compatible with known prostate cancer with suspicion of extension beyond the prostate near the seminal vesicles but no visceral, nodal, or bony metastases.  The patient reviewed the biopsy results with his urologist and he has kindly been referred today to the multidisciplinary prostate cancer clinic for presentation of pathology and radiology studies in our conference for discussion of potential radiation treatment options and clinical evaluation.    PREVIOUS RADIATION THERAPY: No  PAST MEDICAL HISTORY:  has a past medical history of Anemia, Arthritis, CAD (coronary artery disease), Cancer ([place]), CHF (congestive heart failure) ([place]) (2021), Chronic kidney disease, Chronic renal insufficiency, Diabetes mellitus without complication ([place]), Dilated cardiomyopathy ([place]), HTN (hypertension), Hyperlipemia, Hypertension, Noncompliance, PAD (peripheral artery disease) ([place]), and Pneumonia.    PAST SURGICAL HISTORY: Past Surgical  History:  Procedure Laterality Date   CARDIAC CATHETERIZATION  2002, 2006   CORONARY ARTERY BYPASS GRAFT  2002   x4 DR. VAN TRIGT   IR FLUORO GUIDED NEEDLE PLC  ASPIRATION/INJECTION LOC  10/24/2019   TRANSURETHRAL RESECTION OF PROSTATE N/A 01/06/2021   Procedure: CYSTOSCOPY WITH CYSTOGRAM/TRANSURETHRAL RESECTION OF THE PROSTATE (TURP);  Surgeon: Lucas Mallow, MD;  Location: [facility]S;  Service: Urology;  Laterality: N/A;    FAMILY HISTORY: family history includes Diabetes in his mother; Healthy in his daughter; Hypertension in his father.  SOCIAL HISTORY:  reports that he has never smoked. He has never used smokeless tobacco. He reports that he does not currently use alcohol. He reports that he does not currently use drugs.  ALLERGIES: Metformin and related  MEDICATIONS:  Current Outpatient Medications  Medication Sig Dispense Refill   Accu-Chek Softclix Lancets lancets Use Accu Chek softclix lancets to check blood sugar fasting or 2 hours after a meal every other day. 100 each 2   amLODipine (NORVASC) 5 MG tablet Take 1 tablet (5 mg total) by mouth daily. 180 tablet 3   aspirin EC 81 MG tablet Take 81 mg by mouth daily. Swallow whole.     atorvastatin (LIPITOR) 80 MG tablet Take 1 tablet by mouth once daily (Patient taking differently: Take 80 mg by mouth daily.) 90 tablet 3   Blood Glucose Monitoring Suppl (ACCU-CHEK GUIDE ME) w/Device KIT 1 each by Does not apply route.     carvedilol (COREG) 25 MG tablet Take 1 tablet (25 mg total) by mouth 2 (two) times daily with a meal. 180 tablet 3   ciprofloxacin-dexamethasone (CIPRODEX) OTIC suspension Place 4 drops into the left ear 2 (two) times daily. 7.5 mL 0   empagliflozin (JARDIANCE) 10 MG TABS tablet Take 1 tablet (10 mg total) by mouth daily. 30 tablet 1   erythromycin ophthalmic ointment SMARTSIG:Sparingly Left Eye     glucose blood (ACCU-CHEK GUIDE) test strip Use Accu Chek test strips as instructed to check blood sugar fasting or two hours after meals, every other day. 100 each 2   hydrALAZINE (APRESOLINE) 50 MG tablet TAKE 1 TABLET BY MOUTH THREE TIMES DAILY (Patient taking differently: Take  50 mg by mouth 3 (three) times daily.) 270 tablet 3   hydroxypropyl methylcellulose / hypromellose (ISOPTO TEARS / GONIOVISC) 2.5 % ophthalmic solution Place 2 drops into the left eye as needed for dry eyes. 15 mL 2   isosorbide mononitrate (IMDUR) 30 MG 24 hr tablet Take 1 tablet (30 mg total) by mouth daily. 90 tablet 2   meclizine (ANTIVERT) 12.5 MG tablet Take 1-2 tablets (12.5-25 mg total) by mouth 3 (three) times daily as needed for dizziness. 30 tablet 0   potassium chloride SA (KLOR-CON) 20 MEQ tablet Take 1 tablet by mouth once daily (Patient taking differently: Take 20 mEq by mouth daily.) 30 tablet 3   predniSONE (DELTASONE) 5 MG tablet Take 4 tabs po x 4 days, 3  tabs po x 4 days, 2  tabs po x 4 days, 1  tab po x 4 days 40 tablet 0   prochlorperazine (COMPAZINE) 10 MG tablet Take 1 tablet (10 mg total) by mouth every 6 (six) hours as needed (Nausea or vomiting). 30 tablet 1   repaglinide (PRANDIN) 2 MG tablet Take 1 tablet (2 mg total) by mouth 3 (three) times daily before meals. 90 tablet 2   tamsulosin (FLOMAX) 0.4 MG CAPS capsule Take 1 capsule (0.4 mg total) by  mouth daily.     No current facility-administered medications for this encounter.   Facility-Administered Medications Ordered in Other Encounters  Medication Dose Route Frequency Provider Last Rate Last Admin   sodium chloride flush (NS) 0.9 % injection 10 mL  10 mL Intravenous PRN Brunetta Genera, MD       sodium chloride flush (NS) 0.9 % injection 10 mL  10 mL Intracatheter Once PRN Brunetta Genera, MD        REVIEW OF SYSTEMS:  On review of systems, the patient reports that he is doing well overall. He denies any chest pain, shortness of breath, cough, fevers, chills, night sweats, unintended weight changes. He denies any bowel disturbances, and denies abdominal pain, nausea or vomiting. He denies any new musculoskeletal or joint aches or pains. His IPSS was 6, indicating mild urinary symptoms. His SHIM was 5,  indicating he has severe erectile dysfunction. A complete review of systems is obtained and is otherwise negative.   PHYSICAL EXAM:  Wt Readings from Last 3 Encounters:  05/22/21 173 lb 9.6 oz (78.7 kg)  05/07/21 174 lb 14.4 oz (79.3 kg)  04/28/21 190 lb 12.8 oz (86.5 kg)   Temp Readings from Last 3 Encounters:  07/08/21 (!) 96.5 F (35.8 C) (Oral)  05/07/21 98.3 F (36.8 C) (Oral)  05/07/21 98.4 F (36.9 C) (Oral)   BP Readings from Last 3 Encounters:  07/08/21 (!) 186/88  05/22/21 (!) 150/72  05/07/21 (!) 141/79   Pulse Readings from Last 3 Encounters:  07/08/21 86  05/22/21 89  05/07/21 66    /10  In general this is a well appearing African-American male in no acute distress. He's alert and oriented x4 and appropriate throughout the examination. Cardiopulmonary assessment is negative for acute distress and he exhibits normal effort.    KPS = 90  100 - Normal; no complaints; no evidence of disease. 90   - Able to carry on normal activity; minor signs or symptoms of disease. 80   - Normal activity with effort; some signs or symptoms of disease. 66   - Cares for self; unable to carry on normal activity or to do active work. 60   - Requires occasional assistance, but is able to care for most of his personal needs. 50   - Requires considerable assistance and frequent medical care. 79   - Disabled; requires special care and assistance. 88   - Severely disabled; hospital admission is indicated although death not imminent. 40   - Very sick; hospital admission necessary; active supportive treatment necessary. 10   - Moribund; fatal processes progressing rapidly. 0     - Dead  Karnofsky DA, Abelmann Forsyth, Craver LS and Burchenal [facility] [phone]) The use of the nitrogen mustards in the palliative treatment of carcinoma: with particular reference to bronchogenic carcinoma Cancer 1 [id number]   LABORATORY DATA:  Lab Results  Component Value Date   WBC 9.9 05/07/2021   HGB 12.9 (L)  05/07/2021   HCT 39.6 05/07/2021   MCV 85.0 05/07/2021   PLT 203 05/07/2021   Lab Results  Component Value Date   NA 134 (L) 05/07/2021   K 4.3 05/07/2021   CL 105 05/07/2021   CO2 20 (L) 05/07/2021   Lab Results  Component Value Date   ALT 13 05/07/2021   AST 13 (L) 05/07/2021   ALKPHOS 96 05/07/2021   BILITOT 1.2 05/07/2021     RADIOGRAPHY: NM PET (PSMA) SKULL TO MID THIGH  Result Date:  07/03/2021 CLINICAL DATA:  History of prostate cancer in a 65 year old male found at multiple sites on recent biopsy with high-risk disease. Most recent PSA of 12.2. Also with reported history of multiple myeloma. EXAM: NUCLEAR MEDICINE PET SKULL BASE TO THIGH TECHNIQUE: 9.9 mCi F18 Piflufolastat (Pylarify) was injected intravenously. Full-ring PET imaging was performed from the skull base to thigh after the radiotracer. CT data was obtained and used for attenuation correction and anatomic localization. COMPARISON:  PET evaluation of 10/25/2019. FINDINGS: NECK No radiotracer activity in neck lymph nodes. Incidental CT finding: None CHEST No radiotracer accumulation within mediastinal or hilar lymph nodes. No suspicious pulmonary nodules on the CT scan. Incidental CT finding: Calcified atheromatous plaque of the thoracic aorta. Three-vessel coronary artery disease post median sternotomy for CABG. Heart size is enlarged without pericardial effusion. Normal caliber of central pulmonary vessels. No adenopathy by size criteria in the chest. Basilar atelectasis. No effusion. Airways are patent. ABDOMEN/PELVIS Prostate: Activity within the prostate bed biased towards the LEFT prostate maximum SUV 9.2 (image 196/4) This tracks towards the apex and the base in the LEFT posterolateral gland. Area of mild radiotracer accumulation maximum SUV 4.9 (image 192/4) this is near the medial LEFT seminal vesicle at the prostate base and there is an 8 mm area of added density in this location. Lymph nodes: No abnormal radiotracer  accumulation within pelvic or abdominal nodes. Liver: No evidence of liver metastasis Incidental CT finding: Liver, gallbladder, pancreas, spleen and adrenal glands are unremarkable. No hydronephrosis. Mild chronic appearing perinephric stranding and cortical scarring. Small calculus in the LEFT kidney. Hemorrhagic cyst arising from the lower pole the LEFT kidney. This measures 2.1 cm. Urinary bladder is thickened and there are signs of prior TURP likely related to prior bladder outlet obstruction, not well assessed given degree of under distension. Calcified atheromatous plaque is present throughout the abdominal aorta and multiple branch vessels including smaller branch vessels in the abdomen and pelvis. No aneurysmal dilation. Smooth contour the IVC. No signs of adenopathy by size criteria in the abdomen or in the pelvis. SKELETON No focal  activity to suggest skeletal metastasis. IMPRESSION: Signs of activity in the prostate compatible with known prostate cancer. No visceral, nodal or bony metastases. Question of extension beyond the prostate near the seminal vesicles. Prostate MRI could be considered as warranted for further evaluation. Aortic atherosclerosis, Cardiomegaly and signs of prior CABG. Aortic Atherosclerosis (ICD10-I70.0). Electronically Signed   By: Zetta Bills M.D.   On: 07/03/2021 10:59      IMPRESSION/PLAN: 65 y.o. gentleman with Stage T1c adenocarcinoma of the prostate with a Gleason score of 4+5 and a PSA of 12.2.    We discussed the patient's workup and outlined the nature of prostate cancer in this setting. The patient's T stage, Gleason's score, and PSA put him into the high risk group. Accordingly, he is eligible for a variety of potential treatment options including prostatectomy or LT-ADT in combination with either 8 weeks of external radiation or 5 weeks of external radiation with an upfront brachytherapy boost. We discussed the available radiation techniques, and focused on the  details and logistics of delivery. The patient is not felt to be in ideal candidate for brachytherapy boost with a prostate volume of 20 cc prior to downsizing from hormone therapy, as the small gland size would make it challenging, if not impossible, to get good coverage with seeds in such a small amount of tissue. We discussed and outlined the risks, benefits, short and long-term effects  associated with radiotherapy and compared and contrasted these with prostatectomy. We discussed the role of SpaceOAR gel in reducing the rectal toxicity associated with radiotherapy. We also detailed the role of ADT in the treatment of high risk prostate cancer and outlined the associated side effects that could be expected with this therapy. He was encouraged to ask questions that were answered to his stated satisfaction.  At the end of the conversation the patient is interested in moving forward with 8 weeks of external beam therapy in combination with LT-ADT. He has not received his first Lupron injection so we will share our discussion with Dr. Gloriann Loan and make arrangements for a follow-up visit, first available to start ADT now.  We will also coordinate for fiducial markers and SpaceOAR gel placement in late February 2023, prior to simulation, to reduce rectal toxicity from radiotherapy. The patient appears to have a good understanding of his disease and our treatment recommendations which are of curative intent and is in agreement with the stated plan.  Therefore, we will move forward with treatment planning accordingly, in anticipation of beginning IMRT in March 2023, approximately 2 months after starting ADT.   We personally spent 60 minutes in this encounter including chart review, reviewing radiological studies, meeting face-to-face with the patient, entering orders and completing documentation.    Nicholos Johns, PA-C    Tyler Pita, MD  [facility] Direct Dial: [phone]   Fax:  [phone] [email or web address]   Skype   [email or web address]   This document serves as a record of services personally performed by Tyler Pita, MD and Freeman Caldron, PA-C. It was created on their behalf by Wilburn Mylar, a trained medical scribe. The creation of this record is based on the scribe's personal observations and the provider's statements to them. This document has been checked and approved by the attending provider.

## 2021-07-08 NOTE — Progress Notes (Signed)
Reason for the request:    Prostate cancer  HPI: I was asked by Dr. Gloriann Loan to evaluate [facility] for the evaluation of prostate cancer.  He is a 65 year old man with history of multiple myeloma diagnosed in 2021.  He had a bone marrow biopsy at that time which showed 30% of plasma cell infiltration with high risk genetic features.  He was found to have a IgG kappa and received daratumumab based therapy followed by maintenance.  He achieved 80 complete response with protein studies showed normal quantitative immunoglobulins in March 2022.  Daratumumab maintenance was discontinued.  He was found to have a bladder mass and underwent an MRI and found to have a an enlarged median lobe of the prostate.  TURP obtained 11/29/2020 confirmed the presence of prostate cancer Gleason score 4+4 = 8 as well as 4+5 = 9.  His PSA was 14 and September 2022.  PSMA PET scan was obtained on July 03, 2021 which showed no evidence of metastatic disease but activity noted within the prostate consistent with localized disease.  Clinically he reports no urinary complaints.  He denies any frequency urgency or hesitancy.  He denies any recent hospitalization or illnesses.  He is ambulatory with the help of a walker without any recent falls or syncope.  He does not report any headaches, blurry vision, syncope or seizures. Does not report any fevers, chills or sweats.  Does not report any cough, wheezing or hemoptysis.  Does not report any chest pain, palpitation, orthopnea or leg edema.  Does not report any nausea, vomiting or abdominal pain.  Does not report any constipation or diarrhea.  Does not report any skeletal complaints.    Does not report frequency, urgency or hematuria.  Does not report any skin rashes or lesions. Does not report any heat or cold intolerance.  Does not report any lymphadenopathy or petechiae.  Does not report any anxiety or depression.  Remaining review of systems is negative.     Past Medical History:   Diagnosis Date   Anemia    Arthritis    "left knee" (11/24/2017)   CAD (coronary artery disease)    CABG 2002   Cancer [facility])    Multiple Myeloma   CHF (congestive heart failure) ([place]) 2021   Chronic kidney disease    Chronic renal insufficiency    Diabetes mellitus without complication ([place])    Dilated cardiomyopathy ([place])    echo in 2009 showed improvement with a normal EF   HTN (hypertension)    Hyperlipemia    Hypertension    Noncompliance    PAD (peripheral artery disease) ([place])    Pneumonia   :   Past Surgical History:  Procedure Laterality Date   CARDIAC CATHETERIZATION  2002, 2006   CORONARY ARTERY BYPASS GRAFT  2002   x4 DR. VAN TRIGT   IR FLUORO GUIDED NEEDLE PLC ASPIRATION/INJECTION LOC  10/24/2019   TRANSURETHRAL RESECTION OF PROSTATE N/A 01/06/2021   Procedure: CYSTOSCOPY WITH CYSTOGRAM/TRANSURETHRAL RESECTION OF THE PROSTATE (TURP);  Surgeon: Lucas Mallow, MD;  Location: [facility] ORS;  Service: Urology;  Laterality: N/A;  :   Current Outpatient Medications:    Accu-Chek Softclix Lancets lancets, Use Accu Chek softclix lancets to check blood sugar fasting or 2 hours after a meal every other day., Disp: 100 each, Rfl: 2   amLODipine (NORVASC) 5 MG tablet, Take 1 tablet (5 mg total) by mouth daily., Disp: 180 tablet, Rfl: 3   aspirin EC 81 MG tablet, Take  81 mg by mouth daily. Swallow whole., Disp: , Rfl:    atorvastatin (LIPITOR) 80 MG tablet, Take 1 tablet by mouth once daily (Patient taking differently: Take 80 mg by mouth daily.), Disp: 90 tablet, Rfl: 3   Blood Glucose Monitoring Suppl (ACCU-CHEK GUIDE ME) w/Device KIT, 1 each by Does not apply route., Disp: , Rfl:    carvedilol (COREG) 25 MG tablet, Take 1 tablet (25 mg total) by mouth 2 (two) times daily with a meal., Disp: 180 tablet, Rfl: 3   ciprofloxacin-dexamethasone (CIPRODEX) OTIC suspension, Place 4 drops into the left ear 2 (two) times daily., Disp: 7.5 mL, Rfl: 0   empagliflozin (JARDIANCE) 10 MG  TABS tablet, Take 1 tablet (10 mg total) by mouth daily., Disp: 30 tablet, Rfl: 1   erythromycin ophthalmic ointment, SMARTSIG:Sparingly Left Eye, Disp: , Rfl:    glucose blood (ACCU-CHEK GUIDE) test strip, Use Accu Chek test strips as instructed to check blood sugar fasting or two hours after meals, every other day., Disp: 100 each, Rfl: 2   hydrALAZINE (APRESOLINE) 50 MG tablet, TAKE 1 TABLET BY MOUTH THREE TIMES DAILY (Patient taking differently: Take 50 mg by mouth 3 (three) times daily.), Disp: 270 tablet, Rfl: 3   hydroxypropyl methylcellulose / hypromellose (ISOPTO TEARS / GONIOVISC) 2.5 % ophthalmic solution, Place 2 drops into the left eye as needed for dry eyes., Disp: 15 mL, Rfl: 2   isosorbide mononitrate (IMDUR) 30 MG 24 hr tablet, Take 1 tablet (30 mg total) by mouth daily., Disp: 90 tablet, Rfl: 2   meclizine (ANTIVERT) 12.5 MG tablet, Take 1-2 tablets (12.5-25 mg total) by mouth 3 (three) times daily as needed for dizziness., Disp: 30 tablet, Rfl: 0   potassium chloride SA (KLOR-CON) 20 MEQ tablet, Take 1 tablet by mouth once daily (Patient taking differently: Take 20 mEq by mouth daily.), Disp: 30 tablet, Rfl: 3   predniSONE (DELTASONE) 5 MG tablet, Take 4 tabs po x 4 days, 3  tabs po x 4 days, 2  tabs po x 4 days, 1  tab po x 4 days, Disp: 40 tablet, Rfl: 0   prochlorperazine (COMPAZINE) 10 MG tablet, Take 1 tablet (10 mg total) by mouth every 6 (six) hours as needed (Nausea or vomiting)., Disp: 30 tablet, Rfl: 1   repaglinide (PRANDIN) 2 MG tablet, Take 1 tablet (2 mg total) by mouth 3 (three) times daily before meals., Disp: 90 tablet, Rfl: 2   tamsulosin (FLOMAX) 0.4 MG CAPS capsule, Take 1 capsule (0.4 mg total) by mouth daily., Disp: , Rfl:  No current facility-administered medications for this visit.  Facility-Administered Medications Ordered in Other Visits:    sodium chloride flush (NS) 0.9 % injection 10 mL, 10 mL, Intravenous, PRN, Irene Limbo, Cloria [place], MD   sodium  chloride flush (NS) 0.9 % injection 10 mL, 10 mL, Intracatheter, Once PRN, Irene Limbo, Cloria Spring, MD:   Allergies  Allergen Reactions   Metformin And Related Diarrhea  :   Family History  Problem Relation Age of Onset   Hypertension Father    Diabetes Mother        DIABETIC COMA   Healthy Daughter   :   Social History   Socioeconomic History   Marital status: Married    Spouse name: Not on file   Number of children: 1   Years of education: Not on file   Highest education level: 12th grade  Occupational History   Occupation: Furniture conservator/restorer  Tobacco Use   Smoking status: Never  Smokeless tobacco: Never  Vaping Use   Vaping Use: Never used  Substance and Sexual Activity   Alcohol use: Not Currently   Drug use: Not Currently   Sexual activity: Yes  Other Topics Concern   Not on file  Social History Narrative   ** Merged History Encounter **       Social Determinants of Health   Financial Resource Strain: Low Risk    Difficulty of Paying Living Expenses: Not hard at all  Food Insecurity: No Food Insecurity   Worried About Charity fundraiser in the Last Year: Never true   [place] in the Last Year: Never true  Transportation Needs: No Transportation Needs   Lack of Transportation (Medical): No   Lack of Transportation (Non-Medical): No  Physical Activity: Unknown   Days of Exercise per Week: 0 days   Minutes of Exercise per Session: Not on file  Stress: No Stress Concern Present   Feeling of Stress : Not at all  Social Connections: Moderately Isolated   Frequency of Communication with Friends and Family: Once a week   Frequency of Social Gatherings with Friends and Family: Once a week   Attends Religious Services: More than 4 times per year   Active Member of [employer] or Organizations: No   Attends Music therapist: Not on file   Marital Status: Married  Human resources officer Violence: Not on file  :  Pertinent items are noted in  HPI.  Exam: ECOG 1 General appearance: alert and cooperative appeared without distress. Head: atraumatic without any abnormalities. Eyes: conjunctivae/corneas clear. PERRL.  Sclera anicteric. Throat: lips, mucosa, and tongue normal; without oral thrush or ulcers. Resp: clear to auscultation bilaterally without rhonchi, wheezes or dullness to percussion. Cardio: regular rate and rhythm, S1, S2 normal, no murmur, click, rub or gallop GI: soft, non-tender; bowel sounds normal; no masses,  no organomegaly Skin: Skin color, texture, turgor normal. No rashes or lesions Lymph nodes: Cervical, supraclavicular, and axillary nodes normal. Neurologic: Grossly normal without any motor, sensory or deep tendon reflexes. Musculoskeletal: No joint deformity or effusion.   NM PET (PSMA) SKULL TO MID THIGH  Result Date: 07/03/2021 CLINICAL DATA:  History of prostate cancer in a 65 year old male found at multiple sites on recent biopsy with high-risk disease. Most recent PSA of 12.2. Also with reported history of multiple myeloma. EXAM: NUCLEAR MEDICINE PET SKULL BASE TO THIGH TECHNIQUE: 9.9 mCi F18 Piflufolastat (Pylarify) was injected intravenously. Full-ring PET imaging was performed from the skull base to thigh after the radiotracer. CT data was obtained and used for attenuation correction and anatomic localization. COMPARISON:  PET evaluation of 10/25/2019. FINDINGS: NECK No radiotracer activity in neck lymph nodes. Incidental CT finding: None CHEST No radiotracer accumulation within mediastinal or hilar lymph nodes. No suspicious pulmonary nodules on the CT scan. Incidental CT finding: Calcified atheromatous plaque of the thoracic aorta. Three-vessel coronary artery disease post median sternotomy for CABG. Heart size is enlarged without pericardial effusion. Normal caliber of central pulmonary vessels. No adenopathy by size criteria in the chest. Basilar atelectasis. No effusion. Airways are patent.  ABDOMEN/PELVIS Prostate: Activity within the prostate bed biased towards the LEFT prostate maximum SUV 9.2 (image 196/4) This tracks towards the apex and the base in the LEFT posterolateral gland. Area of mild radiotracer accumulation maximum SUV 4.9 (image 192/4) this is near the medial LEFT seminal vesicle at the prostate base and there is an 8 mm area of added density in this  location. Lymph nodes: No abnormal radiotracer accumulation within pelvic or abdominal nodes. Liver: No evidence of liver metastasis Incidental CT finding: Liver, gallbladder, pancreas, spleen and adrenal glands are unremarkable. No hydronephrosis. Mild chronic appearing perinephric stranding and cortical scarring. Small calculus in the LEFT kidney. Hemorrhagic cyst arising from the lower pole the LEFT kidney. This measures 2.1 cm. Urinary bladder is thickened and there are signs of prior TURP likely related to prior bladder outlet obstruction, not well assessed given degree of under distension. Calcified atheromatous plaque is present throughout the abdominal aorta and multiple branch vessels including smaller branch vessels in the abdomen and pelvis. No aneurysmal dilation. Smooth contour the IVC. No signs of adenopathy by size criteria in the abdomen or in the pelvis. SKELETON No focal  activity to suggest skeletal metastasis. IMPRESSION: Signs of activity in the prostate compatible with known prostate cancer. No visceral, nodal or bony metastases. Question of extension beyond the prostate near the seminal vesicles. Prostate MRI could be considered as warranted for further evaluation. Aortic atherosclerosis, Cardiomegaly and signs of prior CABG. Aortic Atherosclerosis (ICD10-I70.0). Electronically Signed   By: Zetta Bills M.D.   On: 07/03/2021 10:59    Assessment and Plan:    65 year old with:  1.  Prostate cancer diagnosed in November 2022 was found to have Gleason score 4+ 5 = PSA 14 with high risk disease.  He has evidence of  seminal vesicle invasion but no metastatic disease on PSMA PET scan.  His case was discussed today in the prostate cancer multidisciplinary clinic and treatment choices were reviewed.  His imaging studies were reviewed with radiology as well as pathology specimen was reviewed with the reviewing pathologist.  Primary surgical therapy versus radiation therapy and long-term androgen deprivation were discussed at this time.  Given his comorbidities as well as presence of high risk of multiple myeloma although it is in remission with high risk of relapse it is reasonable to consider definitive therapy with radiation and long-term ADT.  Complications associated with androgen deprivation therapy including weight gain, hot flashes and sexual dysfunction.  Oncology outcome for both approaches were reviewed and and his preference is to avoid surgery.  Therapy escalation with additional systemic therapy is deferred at this time unless he has advanced disease.  2.  Multiple myeloma: IgG subtype and currently in remission.  Continues to follow with Dr. Irene Limbo regarding this issue.  45  minutes were dedicated to this visit. The time was spent on reviewing laboratory data, imaging studies, discussing treatment options, and answering questions regarding future plan.      A copy of this consult has been forwarded to the requesting physician.

## 2021-07-08 NOTE — Consult Note (Signed)
Multi-Disciplinary Clinic     07/08/2021   --------------------------------------------------------------------------------   [name]  MRN: [id number]  DOB: 22-Jan-1956, 65 year old Male  SSN:    PRIMARY CARE:  [name]. [name], MD  REFERRING:  Latrelle Dodrill. Octavia Heir,   PROVIDER:  Rutherford Nail, M.D.  TREATING:  Heloise Purpura, M.D.  LOCATION:  [facility] [phone]     --------------------------------------------------------------------------------   CC/HPI: CC: Prostate Cancer   Physician requesting consult: Dr. Verdia Kuba  PCP: Dr. Chaya Jan  Location of consult: [facility] - Prostate Cancer Multidisciplinary Clinic   [name] is a 65 year old gentleman who was hospitalized in March 2022 for generalized failure to thrive. He had a CT scan at that time that demonstrated bilateral hydronephrosis and a possible bladder mass. He was in urinary retention at the time and his hydronephrosis resolved after urethral catheter placement. Cystoscopy did not reveal a bladder mass but he was noted to have a large median lobe. He eventually underwent a TURP on 01/06/21 by Dr. Alvester Morin. He was found to incidentally have Gleason 3+4=7 adenocarcinoma in 30% of the tissue resected. He was able to subsequently void after his TURP. A PSA was checked and remained elevated at 12.2 on 04/15/21. He underwent a TRUS biopsy of the prostate on 05/20/21 that confirmed Gleason 4+5=9 adenocarcinoma with 8 out of 12 biopsy cores positive for malignancy.   Family history: None.   Imaging studies: PSMA PET scan (07/02/2021): No evidence of metastatic disease. Questionable left seminal vesicle involvement.   PMH: He has a history of multiple myeloma (diagnosed in 2021, on treatment with daratumumab), rheumatoid arthritis, CAD s/p CABG, PAD, diabetes, hypertension, hyperlipidemia, and CHF. His cardiologist is Dr. [name]. His EF is 55-60%.  PSH:  CABG   TNM stage: cT1c N0 M0  PSA: 12.2  Gleason score: 4+5=9  Biopsy (05/20/21): 8/12 cores positive  Left: L lateral apex (10%, 4+3=7), L apex (10%, 3+4=7), L lateral mid (20%, 4+3=7), L mid (40%, 4+5=9), L lateral base (70%, 4+3=7), L base (40%, 4+4=8)  Right: R base (1%, 4+4=8), R lateral base (5%, 3+3=6)  Prostate volume: 20.2 cc   Nomogram  OC disease: 10%  EPE: 88%  SVI: 52%  LNI: 41%  PFS (5 year, 10 year): 30%, 18%   Urinary function: IPSS is 6. He is voiding well with minimal symptoms since undergoing his TURP. He is off medication.  Erectile function: SHIM score is 5.     ALLERGIES: Metformin    MEDICATIONS: Aspirin 81 mg tablet,chewable  Amlodipine Besylate  Atorvastatin Calcium 80 mg tablet  Carvedilol 25 mg tablet  Cephalexin 500 mg capsule  Compazine  Dexamethasone 4 mg tablet  Hydralazine Hcl 50 mg tablet  Isosorbide Dinitrate 30 mg tablet  Potassium Chloride  Repaglinide 1 mg tablet     GU PSH: Cystoscopy - 11/14/2020 Cystoscopy TURP - 01/06/2021 Prostate Needle Biopsy - 05/20/2021       PSH Notes: heart surgery   NON-GU PSH: Surgical Pathology, Gross And Microscopic Examination For Prostate Needle - 05/20/2021     GU PMH: Prostate Cancer - 05/29/2021, - 05/20/2021, - 04/15/2021 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   Tobacco Use Assessment Completed: Used Tobacco in last 30 days? Does not use smokeless tobacco. Does not use drugs. Drinks 1 caffeinated  drink per day. Patient's occupation is/was Retired.    REVIEW OF SYSTEMS:    GU Review Male:   Patient denies frequent urination, hard to postpone urination, burning/ pain with urination, get up at night to urinate, leakage of urine, stream starts and stops, trouble starting your streams, and have to strain to urinate .  Gastrointestinal (Upper):   Patient denies nausea and vomiting.  Gastrointestinal (Lower):   Patient denies diarrhea and constipation.  Constitutional:   Patient denies fever, night sweats, weight loss, and fatigue.  Skin:   Patient denies skin rash/ lesion and itching.  Eyes:   Patient denies blurred vision and double vision.  Ears/ Nose/ Throat:   Patient denies sore throat and sinus problems.  Hematologic/Lymphatic:   Patient denies swollen glands and easy bruising.  Cardiovascular:   Patient denies leg swelling and chest pains.  Respiratory:   Patient denies cough and shortness of breath.  Endocrine:   Patient denies excessive thirst.  Musculoskeletal:   Patient denies back pain and joint pain.  Neurological:   Patient denies headaches and dizziness.  Psychologic:   Patient denies depression and anxiety.   VITAL SIGNS: None   MULTI-SYSTEM PHYSICAL EXAMINATION:    Constitutional: Well-nourished. No physical deformities. Normally developed. Good grooming.     Complexity of Data:  Lab Test Review:   PSA  Records Review:   Pathology Reports, Previous Patient Records  X-Ray Review: PET Scan: Reviewed Films.     04/15/21 03/25/21  PSA  Total PSA 12.20 ng/mL 14.40 ng/mL   Notes:                     CLINICAL DATA: History of prostate cancer in a 65 year old male  found at multiple sites on recent biopsy with high-risk disease.  Most recent PSA of 12.2. Also with reported history of multiple  myeloma.   EXAM:  NUCLEAR MEDICINE PET SKULL BASE TO THIGH   TECHNIQUE:  9.9 mCi F18 Piflufolastat (Pylarify) was injected intravenously.  Full-ring PET imaging was  performed from the skull base to thigh  after the radiotracer. CT data was obtained and used for attenuation  correction and anatomic localization.   COMPARISON: PET evaluation of 10/25/2019.   FINDINGS:  NECK   No radiotracer activity in neck lymph nodes.   Incidental CT finding: None   CHEST   No radiotracer accumulation within mediastinal or hilar lymph nodes.  No suspicious pulmonary nodules on the CT scan.   Incidental CT finding: Calcified atheromatous plaque of the thoracic  aorta. Three-vessel coronary artery disease post median sternotomy  for CABG. Heart size is enlarged without pericardial effusion.  Normal caliber of central pulmonary vessels. No adenopathy by size  criteria in the chest. Basilar atelectasis. No effusion. Airways are  patent.   ABDOMEN/PELVIS   Prostate: Activity within the prostate bed biased towards the LEFT  prostate maximum SUV 9.2 (image 196/4)   This tracks towards the apex and the base in the LEFT posterolateral  gland.   Area of mild radiotracer accumulation maximum SUV 4.9 (image 192/4)  this is near the medial LEFT seminal vesicle at the prostate base  and  there is an 8 mm area of added density in this location.   Lymph nodes: No abnormal radiotracer accumulation within pelvic or  abdominal nodes.   Liver: No evidence of liver metastasis   Incidental CT finding: Liver, gallbladder, pancreas, spleen and  adrenal glands are unremarkable. No hydronephrosis. Mild chronic  appearing perinephric stranding and cortical scarring. Small  calculus in the LEFT kidney. Hemorrhagic cyst arising from the lower  pole the LEFT kidney. This measures 2.1 cm. Urinary bladder is  thickened and there are signs of prior TURP likely related to prior  bladder outlet obstruction, not well assessed given degree of under  distension. Calcified atheromatous plaque is present throughout the  abdominal aorta and multiple branch vessels including smaller  branch  vessels in the abdomen and pelvis. No aneurysmal dilation. Smooth  contour the IVC. No signs of adenopathy by size criteria in the  abdomen or in the pelvis.   SKELETON   No focal activity to suggest skeletal metastasis.   IMPRESSION:  Signs of activity in the prostate compatible with known prostate  cancer. No visceral, nodal or bony metastases.   Question of extension beyond the prostate near the seminal vesicles.  Prostate MRI could be considered as warranted for further  evaluation.   Aortic atherosclerosis, Cardiomegaly and signs of prior CABG.   Aortic Atherosclerosis (ICD10-I70.0).    Electronically Signed  By: Zetta Bills M.D.  On: 07/03/2021 10:59   PROCEDURES: None   ASSESSMENT:      ICD-10 Details  1 GU:   Prostate Cancer - C61    PLAN:           Document Letter(s):  Created for Patient: Clinical Summary         Notes:   1. High risk and possibly locally advanced prostate cancer: I had a detailed discussion with Mr. Unangst and his wife today in the multidisciplinary clinic regarding treatment options for very high risk prostate cancer. He has been counseled by Dr. Alen Blew already. The patient was counseled about the natural history of prostate cancer and the standard treatment options that are available for prostate cancer. It was explained to him how his age and life expectancy, clinical stage, Gleason score/prognostic grade group, and PSA (and PSA density) affect his prognosis, the decision to proceed with additional staging studies, as well as how that information influences recommended treatment strategies. We discussed the roles for active surveillance, radiation therapy, surgical therapy, androgen deprivation, as well as ablative therapy and other investigational options for the treatment of prostate cancer as appropriate to his individual cancer situation. We discussed the risks and benefits of these options with regard to their impact on cancer  control and also in terms of potential adverse events, complications, and impact on quality of life particularly related to urinary and sexual function. The patient was encouraged to ask questions throughout the discussion today and all questions were answered to his stated satisfaction. In addition, the patient was provided with and/or directed to appropriate resources and literature for further education about prostate cancer and treatment options.   After reviewing options and taking into account his significant medical history including his history of multiple myeloma, he is leaning toward proceeding with long-term androgen deprivation therapy and external beam radiation therapy. He will have further discussion with Dr. Tammi Klippel later this afternoon to potentially confirm his decision.   CC: Dr. Lelon Frohlich  Dr. Alen Blew  Dr. Zola Button  Dr. Tyler Pita

## 2021-07-21 ENCOUNTER — Encounter: Payer: Self-pay | Admitting: General Practice

## 2021-07-21 LAB — HM DIABETES EYE EXAM

## 2021-07-21 NOTE — Progress Notes (Signed)
[facility] Psychosocial Distress Screening Spiritual Care  Left voicemail for Mr Ellner following [facility] to introduce [place] team/resources, reviewing distress screen per protocol.  The patient scored a  0  on the Psychosocial Distress Thermometer which indicates  minimal  distress. Also assessed for distress and other psychosocial needs.   ONCBCN DISTRESS SCREENING 07/21/2021  Screening Type Initial Screening  Distress experienced in past week (1-10) 0  Referral to support programs Yes    Follow up needed: No. Encouraged Mr Byard to return call regarding Patient and Family Support resources.   [place], [place], [facility] Pager [phone] Voicemail [phone]

## 2021-07-22 ENCOUNTER — Other Ambulatory Visit: Payer: Self-pay

## 2021-07-22 ENCOUNTER — Telehealth: Payer: Self-pay | Admitting: *Deleted

## 2021-07-22 ENCOUNTER — Other Ambulatory Visit: Payer: Self-pay | Admitting: Urology

## 2021-07-22 ENCOUNTER — Other Ambulatory Visit (INDEPENDENT_AMBULATORY_CARE_PROVIDER_SITE_OTHER): Payer: Medicare Other

## 2021-07-22 DIAGNOSIS — E1165 Type 2 diabetes mellitus with hyperglycemia: Secondary | ICD-10-CM | POA: Diagnosis not present

## 2021-07-22 DIAGNOSIS — E7800 Pure hypercholesterolemia, unspecified: Secondary | ICD-10-CM | POA: Diagnosis not present

## 2021-07-22 LAB — BASIC METABOLIC PANEL
BUN: 22 mg/dL (ref 6–23)
CO2: 26 mEq/L (ref 19–32)
Calcium: 9.3 mg/dL (ref 8.4–10.5)
Chloride: 106 mEq/L (ref 96–112)
Creatinine, Ser: 1.45 mg/dL (ref 0.40–1.50)
GFR: 50.37 mL/min — ABNORMAL LOW (ref >60.00)
Glucose, Bld: 154 mg/dL — ABNORMAL HIGH (ref 70–99)
Potassium: 3.5 mEq/L (ref 3.5–5.1)
Sodium: 140 mEq/L (ref 135–145)

## 2021-07-22 LAB — HEMOGLOBIN A1C: Hgb A1c MFr Bld: 8 % — ABNORMAL HIGH (ref 4.6–6.5)

## 2021-07-22 LAB — LDL CHOLESTEROL, DIRECT: Direct LDL: 80 mg/dL

## 2021-07-22 NOTE — Telephone Encounter (Signed)
CALLED PATIENT TO ASK HE HAD HEARD FROM [facility] REGARDING HIS ADT, PATIENT INFORMED ME THAT HE HAD HIS ADT ON 07-21-21

## 2021-07-23 LAB — FRUCTOSAMINE: Fructosamine: 297 umol/L — ABNORMAL HIGH (ref 0–285)

## 2021-07-25 ENCOUNTER — Ambulatory Visit: Payer: Medicare Other | Admitting: Endocrinology

## 2021-07-29 ENCOUNTER — Other Ambulatory Visit: Payer: Self-pay

## 2021-07-29 ENCOUNTER — Ambulatory Visit (INDEPENDENT_AMBULATORY_CARE_PROVIDER_SITE_OTHER): Payer: Medicare Other | Admitting: Endocrinology

## 2021-07-29 ENCOUNTER — Encounter: Payer: Self-pay | Admitting: Endocrinology

## 2021-07-29 VITALS — BP 158/80 | HR 74 | Ht 69.5 in | Wt 178.4 lb

## 2021-07-29 DIAGNOSIS — N1831 Chronic kidney disease, stage 3a: Secondary | ICD-10-CM | POA: Diagnosis not present

## 2021-07-29 DIAGNOSIS — E1165 Type 2 diabetes mellitus with hyperglycemia: Secondary | ICD-10-CM

## 2021-07-29 DIAGNOSIS — E7800 Pure hypercholesterolemia, unspecified: Secondary | ICD-10-CM

## 2021-07-29 MED ORDER — DAPAGLIFLOZIN PROPANEDIOL 5 MG PO TABS: 5.0000 mg | ORAL_TABLET | Freq: Every day | ORAL | 3 refills | Status: DC

## 2021-07-29 MED ORDER — CANAGLIFLOZIN 100 MG PO TABS: ORAL_TABLET | ORAL | 3 refills | Status: DC

## 2021-07-29 NOTE — Patient Instructions (Signed)
Check blood sugars on waking up 1-2  days a week  Also check blood sugars about 2 hours after meals and do this after different meals by rotation  Recommended blood sugar levels on waking up are 90-130 and about 2 hours after meal is 130-160  Please bring your blood sugar monitor to each visit, thank you  

## 2021-07-29 NOTE — Progress Notes (Signed)
Patient ID: [name], male   DOB: [birth year], 66 y.o.   MRN: [id number]           Reason for Appointment: Follow-up for Type 2 Diabetes   History of Present Illness:          Date of diagnosis of type 2 diabetes mellitus:   2018      Background history:  He has never been told to have diabetes but review of his previous labs indicate that he had high blood sugar 179 in 2014 His highest sugar has been 234 in 10/2016 when his A1c was 8.8, not clear if he was symptomatic at that time  Recent history:   A1c is significantly better at 8 compared to 9.7  Fructosamine 297   Non-insulin hypoglycemic drugs the patient is taking are:   Prandin 2 mg at meals  Current management, blood sugar patterns and problems identified:  He did bring his monitor for download He has checked his blood sugars again in the mornings for for breakfast and only 1 reading at bedtime  However is lab glucose reportedly was after breakfast and was fairly good at 154  Blood sugars appear to be more steady in the morning  He believes he is taking his Prandin regularly at meals  He was prescribed Jardiance on the last visit because of his inadequate control and renal insufficiency but apparently this was not covered by his insurance His weight is recently about the same Not able to do much walking or other exercise           Glucose monitoring:   has Accu-Chek guide meter  Blood sugars from download  FASTING blood sugar range 98-124, AVERAGE 127 for 30 days Only 1 reading at bedtime of 154  Previous readings:  PRE-MEAL Fasting Lunch Dinner Bedtime Overall  Glucose range: 95-161    95-262  Mean/median:     180   POST-MEAL PC Breakfast PC Lunch PC Dinner  Glucose range: 186-249    Mean/median:       Dietician visit, most recent: 2/21  Weight history:  Wt Readings from Last 3 Encounters:  07/29/21 178 lb 6.4 oz (80.9 kg)  05/22/21 173 lb 9.6 oz (78.7 kg)  05/07/21 174 lb 14.4 oz (79.3  kg)    Glycemic control:   Lab Results  Component Value Date   HGBA1C 8.0 (H) 07/22/2021   HGBA1C 9.7 (A) 05/22/2021   HGBA1C 8.5 (H) 02/10/2021   Lab Results  Component Value Date   MICROALBUR 12.6 (H) 07/21/2018   LDLCALC 39 09/04/2019   CREATININE 1.45 07/22/2021   Lab Results  Component Value Date   MICRALBCREAT 10.0 07/21/2018    Lab Results  Component Value Date   FRUCTOSAMINE 297 (H) 07/22/2021   FRUCTOSAMINE 347 (H) 04/14/2021   FRUCTOSAMINE 237 11/08/2020   Lab Results  Component Value Date   HGB 12.9 (L) 05/07/2021    No visits with results within 1 Week(s) from this visit.  Latest known visit with results is:  Lab on 07/22/2021  Component Date Value Ref Range Status   Fructosamine 07/22/2021 297 (H)  0 - 285 umol/L Final   Comment: Published reference interval for apparently healthy subjects between age 52 and 44 is 58 - 285 umol/L and in a poorly controlled diabetic population is 228 - 563 umol/L with a mean of 396 umol/L.    Direct LDL 07/22/2021 80.0  mg/dL Final   Optimal:  <100 mg/dLNear or Above Optimal:  100-129 mg/dLBorderline High:  130-159 mg/dLHigh:  160-189 mg/dLVery High:  >190 mg/dL   Sodium 07/22/2021 140  135 - 145 mEq/L Final   Potassium 07/22/2021 3.5  3.5 - 5.1 mEq/L Final   Chloride 07/22/2021 106  96 - 112 mEq/L Final   CO2 07/22/2021 26  19 - 32 mEq/L Final   Glucose, Bld 07/22/2021 154 (H)  70 - 99 mg/dL Final   BUN 07/22/2021 22  6 - 23 mg/dL Final   Creatinine, Ser 07/22/2021 1.45  0.40 - 1.50 mg/dL Final   GFR 07/22/2021 50.37 (L)  >60.00 mL/min Final   Calculated using the CKD-EPI Creatinine Equation (2021)   Calcium 07/22/2021 9.3  8.4 - 10.5 mg/dL Final   Hgb A1c MFr Bld 07/22/2021 8.0 (H)  4.6 - 6.5 % Final   Glycemic Control Guidelines for People with Diabetes:Non Diabetic:  <6%Goal of Therapy: <7%Additional Action Suggested:  >8%     Allergies as of 07/29/2021       Reactions   Metformin And Related Diarrhea         Medication List        Accurate as of July 29, 2021 11:59 PM. If you have any questions, ask your nurse or doctor.          STOP taking these medications    empagliflozin 10 MG Tabs tablet Commonly known as: Jardiance Stopped by: Elayne Snare, MD       TAKE these medications    Accu-Chek Guide Me w/Device Kit 1 each by Does not apply route.   Accu-Chek Guide test strip Generic drug: glucose blood Use Accu Chek test strips as instructed to check blood sugar fasting or two hours after meals, every other day.   Accu-Chek Softclix Lancets lancets Use Accu Chek softclix lancets to check blood sugar fasting or 2 hours after a meal every other day.   amLODipine 5 MG tablet Commonly known as: NORVASC Take 1 tablet (5 mg total) by mouth daily.   aspirin EC 81 MG tablet Take 81 mg by mouth daily. Swallow whole.   atorvastatin 80 MG tablet Commonly known as: LIPITOR Take 1 tablet by mouth once daily   canagliflozin 100 MG Tabs tablet Commonly known as: Invokana 1 tablet before breakfast Started by: Elayne Snare, MD   carvedilol 25 MG tablet Commonly known as: COREG Take 1 tablet (25 mg total) by mouth 2 (two) times daily with a meal.   ciprofloxacin-dexamethasone OTIC suspension Commonly known as: CIPRODEX Place 4 drops into the left ear 2 (two) times daily.   dapagliflozin propanediol 5 MG Tabs tablet Commonly known as: Farxiga Take 1 tablet (5 mg total) by mouth daily. Started by: Elayne Snare, MD   erythromycin ophthalmic ointment SMARTSIG:Sparingly Left Eye   hydrALAZINE 50 MG tablet Commonly known as: APRESOLINE TAKE 1 TABLET BY MOUTH THREE TIMES DAILY   hydroxypropyl methylcellulose / hypromellose 2.5 % ophthalmic solution Commonly known as: ISOPTO TEARS / GONIOVISC Place 2 drops into the left eye as needed for dry eyes.   isosorbide mononitrate 30 MG 24 hr tablet Commonly known as: IMDUR Take 1 tablet (30 mg total) by mouth daily.   meclizine  12.5 MG tablet Commonly known as: ANTIVERT Take 1-2 tablets (12.5-25 mg total) by mouth 3 (three) times daily as needed for dizziness.   potassium chloride SA 20 MEQ tablet Commonly known as: KLOR-CON M Take 1 tablet by mouth once daily   predniSONE 5 MG tablet Commonly known as: DELTASONE Take 4  tabs po x 4 days, 3  tabs po x 4 days, 2  tabs po x 4 days, 1  tab po x 4 days   prochlorperazine 10 MG tablet Commonly known as: COMPAZINE Take 1 tablet (10 mg total) by mouth every 6 (six) hours as needed (Nausea or vomiting).   repaglinide 2 MG tablet Commonly known as: Prandin Take 1 tablet (2 mg total) by mouth 3 (three) times daily before meals.   tamsulosin 0.4 MG Caps capsule Commonly known as: FLOMAX Take 1 capsule (0.4 mg total) by mouth daily.        Allergies:  Allergies  Allergen Reactions   Metformin And Related Diarrhea    Past Medical History:  Diagnosis Date   Anemia    Arthritis    "left knee" (11/24/2017)   CAD (coronary artery disease)    CABG 2002   Cancer [facility])    Multiple Myeloma   CHF (congestive heart failure) ([place]) 2021   Chronic kidney disease    Chronic renal insufficiency    Diabetes mellitus without complication ([place])    Dilated cardiomyopathy ([place])    echo in 2009 showed improvement with a normal EF   HTN (hypertension)    Hyperlipemia    Hypertension    Noncompliance    PAD (peripheral artery disease) ([place])    Pneumonia     Past Surgical History:  Procedure Laterality Date   CARDIAC CATHETERIZATION  2002, 2006   CORONARY ARTERY BYPASS GRAFT  2002   x4 DR. VAN TRIGT   IR FLUORO GUIDED NEEDLE PLC ASPIRATION/INJECTION LOC  10/24/2019   TRANSURETHRAL RESECTION OF PROSTATE N/A 01/06/2021   Procedure: CYSTOSCOPY WITH CYSTOGRAM/TRANSURETHRAL RESECTION OF THE PROSTATE (TURP);  Surgeon: Lucas Mallow, MD;  Location: [facility];  Service: Urology;  Laterality: N/A;    Family History  Problem Relation Age of Onset   Hypertension Father     Diabetes Mother        DIABETIC COMA   Healthy Daughter     Social History:  reports that he has never smoked. He has never used smokeless tobacco. He reports that he does not currently use alcohol. He reports that he does not currently use drugs.   Review of Systems   Lipid history: On atorvastatin 80 mg from his cardiologist since diagnosis of CAD, labs as follows    Lab Results  Component Value Date   CHOL 92 (L) 09/04/2019   HDL 35 (L) 09/04/2019   LDLCALC 39 09/04/2019   LDLDIRECT 80.0 07/22/2021   TRIG 89 09/04/2019   CHOLHDL 2.6 09/04/2019           Hypertension: Has been followed by cardiologist for blood pressure management Blood pressure is frequently high  Aldosterone level has been checked for his history of hypokalemia and is normal  Recent readings:  BP Readings from Last 3 Encounters:  07/29/21 (!) 158/80  07/08/21 (!) 186/88  05/22/21 (!) 150/72   RENAL dysfunction:  Renal dysfunction not related to diabetes Has variable levels as follows No history of microalbuminuria but needs follow-up  Lab Results  Component Value Date   CREATININE 1.45 07/22/2021   CREATININE 1.54 (H) 05/07/2021   CREATININE 1.56 (H) 04/14/2021   Lab Results  Component Value Date   K 3.5 07/22/2021    Most recent eye exam was in 2018  Most recent foot exam: 07/2018   LABS:  No visits with results within 1 Week(s) from this visit.  Latest known visit  with results is:  Lab on 07/22/2021  Component Date Value Ref Range Status   Fructosamine 07/22/2021 297 (H)  0 - 285 umol/L Final   Comment: Published reference interval for apparently healthy subjects between age 71 and 85 is 44 - 285 umol/L and in a poorly controlled diabetic population is 228 - 563 umol/L with a mean of 396 umol/L.    Direct LDL 07/22/2021 80.0  mg/dL Final   Optimal:  <100 mg/dLNear or Above Optimal:  100-129 mg/dLBorderline High:  130-159 mg/dLHigh:  160-189 mg/dLVery High:  >190 mg/dL    Sodium 07/22/2021 140  135 - 145 mEq/L Final   Potassium 07/22/2021 3.5  3.5 - 5.1 mEq/L Final   Chloride 07/22/2021 106  96 - 112 mEq/L Final   CO2 07/22/2021 26  19 - 32 mEq/L Final   Glucose, Bld 07/22/2021 154 (H)  70 - 99 mg/dL Final   BUN 07/22/2021 22  6 - 23 mg/dL Final   Creatinine, Ser 07/22/2021 1.45  0.40 - 1.50 mg/dL Final   GFR 07/22/2021 50.37 (L)  >60.00 mL/min Final   Calculated using the CKD-EPI Creatinine Equation (2021)   Calcium 07/22/2021 9.3  8.4 - 10.5 mg/dL Final   Hgb A1c MFr Bld 07/22/2021 8.0 (H)  4.6 - 6.5 % Final   Glycemic Control Guidelines for People with Diabetes:Non Diabetic:  <6%Goal of Therapy: <7%Additional Action Suggested:  >8%     Physical Examination:  BP (!) 158/80    Pulse 74    Ht 5' 9.5" (1.765 m)    Wt 178 lb 6.4 oz (80.9 kg)    SpO2 99%    BMI 25.97 kg/m           ASSESSMENT:  Diabetes type 2, mild  See history of present illness for detailed discussion of current diabetes management, blood sugar patterns and problems identified  A1c is recently 8%  Currently only on repaglinide 2 mg before meals With this his fasting blood sugars are fairly good but not clear if he may have some high readings after meals  HYPERCHOLESTEROLEMIA: His LDL is 80, currently atorvastatin wound prescription being done by his cardiologist  CKD: We will need to recheck microalbumin on the next visit  PLAN:      He will continue to take Repeglanide 1 tablets of the 2 mg before each meal  Since Jardiance was not covered by insurance we will try to send prescription for Farxiga and Invokana to see if they are available on his insurance formulary This may also help his blood pressure  Emphasized need to check blood sugars couple of hours after meals especially with any meals that have carbohydrate Encouraged him to start walking for exercise  He will make sure he is taking his atorvastatin daily  Patient Instructions  Check blood sugars on waking up  1-2 days a week  Also check blood sugars about 2 hours after meals and do this after different meals by rotation  Recommended blood sugar levels on waking up are 90-130 and about 2 hours after meal is 130-160  Please bring your blood sugar monitor to each visit, thank you      Elayne Snare 07/30/2021, 8:54 AM   Note: This office note was prepared with Dragon voice recognition system technology. Any transcriptional errors that result from this process are unintentional.

## 2021-07-30 ENCOUNTER — Other Ambulatory Visit (HOSPITAL_COMMUNITY): Payer: Self-pay

## 2021-07-30 ENCOUNTER — Encounter: Payer: Self-pay | Admitting: Hematology

## 2021-08-11 LAB — PSA: PSA: 1.26

## 2021-08-20 ENCOUNTER — Other Ambulatory Visit: Payer: Self-pay | Admitting: Endocrinology

## 2021-08-20 ENCOUNTER — Other Ambulatory Visit: Payer: Self-pay | Admitting: Cardiology

## 2021-08-21 HISTORY — PX: CATARACT EXTRACTION W/ INTRAOCULAR LENS IMPLANT: SHX1309

## 2021-08-26 ENCOUNTER — Encounter: Payer: Self-pay | Admitting: Internal Medicine

## 2021-09-02 ENCOUNTER — Encounter (HOSPITAL_BASED_OUTPATIENT_CLINIC_OR_DEPARTMENT_OTHER): Payer: Self-pay | Admitting: Urology

## 2021-09-03 ENCOUNTER — Other Ambulatory Visit: Payer: Self-pay

## 2021-09-03 ENCOUNTER — Encounter (HOSPITAL_BASED_OUTPATIENT_CLINIC_OR_DEPARTMENT_OTHER): Payer: Self-pay | Admitting: Urology

## 2021-09-03 NOTE — Progress Notes (Addendum)
Chart reviewed w/ anesthesia, Dr Lissa Hoard MDA , for surgery scheduled 09-05-2021.  Pt has not returned pre-op call yet.  Dr Lissa Hoard MDA stated for pt to proceed unless when pre-op interview done pt has status change which speak with anesthesia.  Spoke w/ via phone for pre-op interview--- pt's wife, Adonis Huguenin Lab needs dos----  [employer] results------ current ekg in epic/ chart COVID test -----patient states asymptomatic no test needed  Arrive at ------- 0530 on 09-05-2021 NPO after MN NO Solid Food.  Clear liquids from MN until--- 0430 Med rec completed Medications to take morning of surgery ----- imdur, lipitor, hydralazine, coreg, norvasc Diabetic medication ----- do not take farxiga, prandin, invokana morning of surgery  Patient instructed to bring photo id and insurance card day of surgery Patient aware to have Driver (ride ) / caregiver  for 24 hours after surgery--- wife, Adonis Huguenin Patient Special Instructions ----- will do one fleet enema morning of surgery Pre-Op special Istructions ----- n/a Patient verbalized understanding of instructions that were given at this phone interview. Patient denies shortness of breath, chest pain, fever, cough at this phone interview.    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 Pt wife stated pt does not have cardiac symptoms, sob, and no peripheral swelling.   PCP:  Dr A. Jerilee Hoh [facility] 05-07-2021 epic) Cardiologist : Dr Martinique ([place] 04-28-2021 epic) Endocrinologist:  Dr Eduard Clos ([place] 07-19-2021 epic) Nephrologist:  Dr Joelyn Oms [facility] 09-26-2020  scanned in media and copy with chart) Oncologist:  Dr Irene Limbo [facility] 10-07-2020 epic)  Chest x-ray : 10-09-2020 EKG : 03-08-2021 Echo : 03-14-2020 Stress test:  no Cardiac Cath :  04-21-2005 epic Activity level:  pt wife stated pt has no sob w/ normal activity Sleep Study/ CPAP : no Fasting Blood Sugar :  150--230)    / Checks Blood Sugar -- times a day:  QOD fasting Blood Thinner/ Instructions Maryjane Hurter Dose: NO ASA / Instructions/ Last Dose :  ASA $Re'81mg'Efl$ /  per wife was not given instructions to stop prior to surgery

## 2021-09-04 NOTE — H&P (Signed)
Office Visit Report     08/19/2021   --------------------------------------------------------------------------------   [name]  MRN: [id number]  DOB: [birth year], 66 year old Male  SSN:    PRIMARY CARE:  Rayford Halsted. Isaac Bliss, MD  REFERRING:  Wonda Cheng. Williams Che,   PROVIDER:  Wendie Simmer, M.D.  TREATING:  Jiles Crocker, NP  LOCATION:  [facility] [phone]     --------------------------------------------------------------------------------   CC/HPI: CC: History urinary retention with bilateral hydronephrosis and abdominal versus bladder mass  HPI:  66 year old male was recently admitted to the hospital with failure to thrive. A CT of the abdomen and pelvis without contrast revealed severe bilateral hydronephrosis down to the level of a markedly irregular bladder with masslike extension of the bladder dome appearing to be a large mass. On my review, is difficult to tell if this mass is in the a bladder dome mass versus a primary abdominal mass. He presents for cystoscopy for further evaluation. He has been tolerating the catheter well.   12/17/2020  Patient is status post MRI. This revealed fluid-filled diverticulum without any mass in the bladder. I had performed a cystoscopy that revealed an obstructing prostate but I did not see any obvious diverticulum or mass but there was some impaired visualization due to his chronic catheterization. MRI did confirm enlarged prostate with median lobe.   01/14/2021: 66 year old male who presents today after undergoing TURP on 01/05/2021. He has been tolerating the catheter well with no complaints. He would like to proceed with a voiding trial. He denies fevers and chills. He denies gross hematuria and the catheter bag. His wife is with him today.   02/14/21: PResents for one month office visit after TURP. He is doing very well. He reports a good flow of stream, less frequency and his nocturia is one time per  night. No complaints of fevers, chills or dysuria. Ocassional urgency.   04/15/2021  Most recent PSA was 14 0.4 on 03/25/2021. He is scheduled to undergo prostate biopsy in November. He is voiding well with low PVR. He has no complaints today. 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 He has a history multiple myeloma. He uses a walker for assistance with walking.   05/29/2021  Patient presents after undergoing prostate biopsy. This revealed a prostate size of 20 g. Unfortunately, biopsy results revealed 8 out of 12 cores positive for prostate cancer, maximum Gleason score 4+5. Patient has no complaints today. 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 Cystoscopy did not show any obvious tumors but also did not show any obvious connection to a diverticulum and cystogram also did not show an obvious connection. He has done well with voiding after transurethral resection. He has no incontinence. He has no voiding complaints at this time. No hematuria or dysuria. He does have erectile dysfunction. He is not sexually active.   07/21/2021  In the interval, the patient met with Dr. Tammi Klippel and Dr. Alinda Money in the multidisciplinary clinic. He elected proceed with 2 years of ADT and concurrent external beam radiation. He will need markers and SpaceOAR placed in February. He presents today to start ADT. PET scan was negative for metastatic disease.   08/19/2021: Patient is scheduled for fiducial marker placement and SpaceOAR later this month in anticipation of beginning external beam radiation treatment sometime in March or early April. He received Mills Koller at last office visit. He will begin Eligard every 3  months through 2025 for a total of 2 years of treatment. Back today for follow-up exam. PSA assessed prior to today's visit has decreased to 1.26, testosterone at castrate level. Doing well today. He has had no side effects from  [place] denying any hot flashes or increased fatigue. Voiding symptoms remain stable without bothersome changes in force of stream, increased daytime frequency/urgency. No interval dysuria or gross hematuria. Nocturia stable 1-2 times nightly. He has had no interval fevers or chills, nausea/vomiting. No interval treatment for yeast infection or UTI. He denies any changes in past medical history, prescription medications taken on a daily basis, no interval surgical or procedural intervention. He denies any recent chest pain, shortness of breath, lightheadedness or dizziness.     ALLERGIES: Metformin    MEDICATIONS: Aspirin 81 mg tablet,chewable  Amlodipine Besylate  Atorvastatin Calcium 80 mg tablet  Carvedilol 25 mg tablet  Compazine  Dexamethasone 4 mg tablet  Hydralazine Hcl 50 mg tablet  Isosorbide Dinitrate 30 mg tablet  Potassium Chloride  Repaglinide 1 mg tablet     GU PSH: Cystoscopy - 11/14/2020 Cystoscopy TURP - 01/06/2021 Prostate Needle Biopsy - 05/20/2021       PSH Notes: heart surgery   NON-GU PSH: Surgical Pathology, Gross And Microscopic Examination For Prostate Needle - 05/20/2021     GU PMH: Prostate Cancer - 07/21/2021, - 07/08/2021, - 05/29/2021, - 05/20/2021, - 04/15/2021 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   Tobacco Use Assessment Completed: Used Tobacco in last 30 days? Does not use smokeless tobacco. Does not use drugs. Drinks 1 caffeinated drink per  day. Patient's occupation is/was Retired.    REVIEW OF SYSTEMS:    GU Review Male:   Patient reports get up at night to urinate. Patient denies frequent urination, penile pain, burning/ pain with urination, stream starts and stops, hard to postpone urination, leakage of urine, erection problems, trouble starting your stream, and have to strain to urinate .  Gastrointestinal (Upper):   Patient denies nausea, vomiting, and indigestion/ heartburn.  Gastrointestinal (Lower):   Patient denies diarrhea and constipation.  Constitutional:   Patient denies fever, night sweats, weight loss, and fatigue.  Skin:   Patient denies skin rash/ lesion and itching.  Eyes:   Patient denies blurred vision and double vision.  Ears/ Nose/ Throat:   Patient denies sore throat and sinus problems.  Hematologic/Lymphatic:   Patient denies swollen glands and easy bruising.  Cardiovascular:   Patient denies leg swelling and chest pains.  Respiratory:   Patient denies cough and shortness of breath.  Endocrine:   Patient denies excessive thirst.  Musculoskeletal:   Patient denies back pain and joint pain.  Neurological:   Patient denies headaches and dizziness.  Psychologic:   Patient denies depression and anxiety.   VITAL SIGNS:      08/19/2021 09:32 AM  Temperature 97.8 F / 36.5 C   MULTI-SYSTEM PHYSICAL EXAMINATION:    Constitutional: Well-nourished. No physical deformities. Normally developed. Good grooming.  Neck: Neck symmetrical, not swollen. Normal tracheal position.  Respiratory: No labored  breathing, no use of accessory muscles.   Cardiovascular: Normal temperature, normal extremity pulses, no swelling, no varicosities.  Skin: No paleness, no jaundice, no cyanosis. No lesion, no ulcer, no rash.  Neurologic / Psychiatric: Oriented to time, oriented to place, oriented to person. No depression, no anxiety, no agitation.  Gastrointestinal: No mass, no tenderness, no rigidity, non obese abdomen.   Musculoskeletal: Normal gait and station of head and neck.     Complexity of Data:  Source Of History:  Patient, Medical Record Summary  Lab Test Review:   PSA, Total Testosterone  Records Review:   Pathology Reports, Previous Doctor Records, Previous Hospital Records  Urine Test Review:   Urinalysis   08/11/21 04/15/21 03/25/21  PSA  Total PSA 1.26 ng/mL 12.20 ng/mL 14.40 ng/mL    08/11/21  Hormones  Testosterone, Total <10 ng/dL    08/19/21  Urinalysis  Urine Appearance Clear   Urine Color Yellow   Urine Glucose Neg mg/dL  Urine Bilirubin Neg mg/dL  Urine Ketones Neg mg/dL  Urine Specific Gravity 1.015   Urine Blood Neg ery/uL  Urine pH <=5.0   Urine Protein Neg mg/dL  Urine Urobilinogen 0.2 mg/dL  Urine Nitrites Neg   Urine Leukocyte Esterase Neg leu/uL   PROCEDURES:          Urinalysis Dipstick Dipstick Cont'd  Color: Yellow Bilirubin: Neg mg/dL  Appearance: Clear Ketones: Neg mg/dL  Specific Gravity: 1.015 Blood: Neg ery/uL  pH: <=5.0 Protein: Neg mg/dL  Glucose: Neg mg/dL Urobilinogen: 0.2 mg/dL    Nitrites: Neg    Leukocyte Esterase: Neg leu/uL         Eligard 22.$RemoveBef'5mg'YJXBoCzUla$ / 3 Month - [id number], [id number] The arm/ abdomen/ hip/ thigh was sterilely prepped with alcohol. Eligard was injected subcutaneously ([place]) using standard technique. The patient tolerated the procedure well. A band aid was applied. The site was dry when the patient left the exam room. The patient will return as scheduled.    Qty: 22.5 Adm. By: Alcide Goodness  Unit: mg Lot No [id number]  Route: SQ Exp. Date 10/12/2022  Freq: None Mfgr.:   Site: Right Hip   ASSESSMENT:      ICD-10 Details  1 GU:   Prostate Cancer - C61 Chronic, Threat to Bodily Function  2   BPH w/LUTS - N40.1 Chronic, Stable  3   Nocturia - R35.1 Chronic, Stable   PLAN:           Orders Labs Urine Culture          Schedule Labs: 3 Months - PSA    3 Months - Total Testosterone  Return Visit/Planned Activity: 3 Months - Follow  up MD, Eligard  Return Visit/Planned Activity: Keep Scheduled Appointment - Schedule Surgery  Return Visit/Planned Activity: One Week Prior to Visit - PSA, Total Testosterone  Procedure: 08/19/2021 at [facility] - [phone] - Eligard 22.$RemoveBefo'5mg'WAdyHnCJnfr$ / 3 Month (Eligard) - [id number], [phone]          Document Letter(s):  Created for Patient: Clinical Summary         Notes:   So far tolerating ADT well. He will receive Eligard today. I did recommend starting a calcium and vitamin D supplement to maintain bone health as he continues with ADT. Did discuss expected side effects including fatigue and hot flashes. He will let us know if these become too bothersome. Moving forward he will proceed with fiducial marker placement and SpaceOAR on the 24th. I am going to tentatively schedule him to  return in 3 months for repeat exam with labs assessed prior, repeat ADT. He should be finished with radiation at that time. All questions answered to the best of my ability regarding the upcoming procedure and expected postoperative course as well as expected increased lower urinary tract symptoms with radiation treatment. Understanding expressed by the patient.        Next Appointment:      Next Appointment: 09/05/2021 07:30 AM    Appointment Type: Surgery     Location: [facility] [phone]    Provider: Festus Aloe, M.D.    Reason for Visit: OP [place]      * Signed by Jiles Crocker, NP on 08/19/21 at 10:54 AM (EST)*      The information contained in this medical record document is considered private and confidential patient information. This information can only be used for the medical diagnosis and/or medical services that are being provided by the patient's selected caregivers. This information can only be distributed outside of the patient's care if the patient agrees and signs waivers of authorization for this information to be sent to an outside source  or route.

## 2021-09-05 ENCOUNTER — Encounter (HOSPITAL_BASED_OUTPATIENT_CLINIC_OR_DEPARTMENT_OTHER)
Admission: RE | Disposition: A | Discharge status: Home / Self Care | Payer: Self-pay | Source: Home / Self Care | Attending: Urology

## 2021-09-05 ENCOUNTER — Ambulatory Visit (HOSPITAL_BASED_OUTPATIENT_CLINIC_OR_DEPARTMENT_OTHER): Payer: Medicare Other | Admitting: Anesthesiology

## 2021-09-05 ENCOUNTER — Encounter (HOSPITAL_BASED_OUTPATIENT_CLINIC_OR_DEPARTMENT_OTHER): Payer: Self-pay | Admitting: Urology

## 2021-09-05 ENCOUNTER — Ambulatory Visit (HOSPITAL_BASED_OUTPATIENT_CLINIC_OR_DEPARTMENT_OTHER)
Admission: RE | Admit: 2021-09-05 | Discharge: 2021-09-05 | Disposition: A | Discharge status: Home / Self Care | Payer: Medicare Other | Attending: Urology | Admitting: Urology

## 2021-09-05 DIAGNOSIS — I10 Essential (primary) hypertension: Secondary | ICD-10-CM | POA: Diagnosis not present

## 2021-09-05 DIAGNOSIS — E1151 Type 2 diabetes mellitus with diabetic peripheral angiopathy without gangrene: Secondary | ICD-10-CM

## 2021-09-05 DIAGNOSIS — C9000 Multiple myeloma not having achieved remission: Secondary | ICD-10-CM | POA: Insufficient documentation

## 2021-09-05 DIAGNOSIS — E119 Type 2 diabetes mellitus without complications: Secondary | ICD-10-CM | POA: Diagnosis not present

## 2021-09-05 DIAGNOSIS — C61 Malignant neoplasm of prostate: Secondary | ICD-10-CM

## 2021-09-05 DIAGNOSIS — N401 Enlarged prostate with lower urinary tract symptoms: Secondary | ICD-10-CM | POA: Insufficient documentation

## 2021-09-05 DIAGNOSIS — I739 Peripheral vascular disease, unspecified: Secondary | ICD-10-CM | POA: Diagnosis not present

## 2021-09-05 DIAGNOSIS — Z951 Presence of aortocoronary bypass graft: Secondary | ICD-10-CM | POA: Insufficient documentation

## 2021-09-05 DIAGNOSIS — I2510 Atherosclerotic heart disease of native coronary artery without angina pectoris: Secondary | ICD-10-CM

## 2021-09-05 DIAGNOSIS — M069 Rheumatoid arthritis, unspecified: Secondary | ICD-10-CM | POA: Diagnosis not present

## 2021-09-05 DIAGNOSIS — R351 Nocturia: Secondary | ICD-10-CM | POA: Diagnosis not present

## 2021-09-05 HISTORY — DX: Personal history of transient ischemic attack (TIA), and cerebral infarction without residual deficits: Z86.73

## 2021-09-05 HISTORY — DX: Central perforation of tympanic membrane, left ear: H72.02

## 2021-09-05 HISTORY — DX: Chronic diastolic (congestive) heart failure: I50.32

## 2021-09-05 HISTORY — DX: Chronic kidney disease, stage 4 (severe): N18.4

## 2021-09-05 HISTORY — DX: Benign prostatic hyperplasia with lower urinary tract symptoms: N40.1

## 2021-09-05 HISTORY — DX: Chronic ischemic heart disease, unspecified: I25.9

## 2021-09-05 HISTORY — DX: Unspecified osteoarthritis, unspecified site: M19.90

## 2021-09-05 HISTORY — DX: Bell's palsy: G51.0

## 2021-09-05 HISTORY — DX: Dependence on other enabling machines and devices: Z99.89

## 2021-09-05 HISTORY — DX: Type 2 diabetes mellitus without complications: E11.9

## 2021-09-05 HISTORY — DX: Chronic kidney disease, stage 3 unspecified: N18.30

## 2021-09-05 HISTORY — DX: Personal history of COVID-19: Z86.16

## 2021-09-05 HISTORY — PX: SPACE OAR INSTILLATION: SHX6769

## 2021-09-05 HISTORY — PX: GOLD SEED IMPLANT: SHX6343

## 2021-09-05 HISTORY — DX: Presence of spectacles and contact lenses: Z97.3

## 2021-09-05 HISTORY — DX: Personal history of other infectious and parasitic diseases: Z86.19

## 2021-09-05 HISTORY — DX: Rheumatoid arthritis, unspecified: M06.9

## 2021-09-05 LAB — POCT I-STAT, CHEM 8
BUN: 28 mg/dL — ABNORMAL HIGH (ref 8–23)
Calcium, Ion: 1.18 mmol/L (ref 1.15–1.40)
Chloride: 109 mmol/L (ref 98–111)
Creatinine, Ser: 1.5 mg/dL — ABNORMAL HIGH (ref 0.61–1.24)
Glucose, Bld: 141 mg/dL — ABNORMAL HIGH (ref 70–99)
HCT: 38 % — ABNORMAL LOW (ref 39.0–52.0)
Hemoglobin: 12.9 g/dL — ABNORMAL LOW (ref 13.0–17.0)
Potassium: 3.5 mmol/L (ref 3.5–5.1)
Sodium: 142 mmol/L (ref 135–145)
TCO2: 24 mmol/L (ref 22–32)

## 2021-09-05 LAB — GLUCOSE, CAPILLARY: Glucose-Capillary: 117 mg/dL — ABNORMAL HIGH (ref 70–99)

## 2021-09-05 SURGERY — INSERTION, GOLD SEEDS
Anesthesia: General | Site: Prostate

## 2021-09-05 MED ORDER — PHENYLEPHRINE HCL (PRESSORS) 10 MG/ML IV SOLN
INTRAVENOUS | Status: DC | PRN
  Administered 2021-09-05: 80 ug via INTRAVENOUS

## 2021-09-05 MED ORDER — DEXAMETHASONE SODIUM PHOSPHATE 4 MG/ML IJ SOLN
INTRAMUSCULAR | Status: DC | PRN
  Administered 2021-09-05: 4 mg via INTRAVENOUS

## 2021-09-05 MED ORDER — LIDOCAINE HCL (PF) 2 % IJ SOLN
INTRAMUSCULAR | Status: AC
  Filled 2021-09-05: qty 5

## 2021-09-05 MED ORDER — FLEET ENEMA 7-19 GM/118ML RE ENEM: 1.0000 | ENEMA | Freq: Once | RECTAL | Status: DC

## 2021-09-05 MED ORDER — SODIUM CHLORIDE 0.9 % IV SOLN
1.0000 g | INTRAVENOUS | Status: AC
  Administered 2021-09-05: 1 g via INTRAVENOUS

## 2021-09-05 MED ORDER — ONDANSETRON HCL 4 MG/2ML IJ SOLN: 4.0000 mg | Freq: Once | INTRAMUSCULAR | Status: DC | PRN

## 2021-09-05 MED ORDER — SODIUM CHLORIDE FLUSH 0.9 % IV SOLN
INTRAVENOUS | Status: DC | PRN
  Administered 2021-09-05: 10 mL via INTRAVENOUS

## 2021-09-05 MED ORDER — FENTANYL CITRATE (PF) 100 MCG/2ML IJ SOLN
INTRAMUSCULAR | Status: DC | PRN
Start: 2021-09-05 — End: 2021-09-05
  Administered 2021-09-05: 50 ug via INTRAVENOUS

## 2021-09-05 MED ORDER — PROPOFOL 10 MG/ML IV BOLUS
INTRAVENOUS | Status: DC | PRN
  Administered 2021-09-05: 150 mg via INTRAVENOUS
  Administered 2021-09-05: 20 mg via INTRAVENOUS

## 2021-09-05 MED ORDER — FENTANYL CITRATE (PF) 100 MCG/2ML IJ SOLN: 25.0000 ug | INTRAMUSCULAR | Status: DC | PRN

## 2021-09-05 MED ORDER — SODIUM CHLORIDE 0.9 % IV SOLN: INTRAVENOUS | Status: DC

## 2021-09-05 MED ORDER — MIDAZOLAM HCL 2 MG/2ML IJ SOLN
INTRAMUSCULAR | Status: AC
  Filled 2021-09-05: qty 2

## 2021-09-05 MED ORDER — SODIUM CHLORIDE 0.9 % IV SOLN
INTRAVENOUS | Status: AC
Start: 2021-09-05 — End: ?
  Filled 2021-09-05: qty 100

## 2021-09-05 MED ORDER — OXYCODONE HCL 5 MG/5ML PO SOLN: 5.0000 mg | Freq: Once | ORAL | Status: DC | PRN

## 2021-09-05 MED ORDER — ONDANSETRON HCL 4 MG/2ML IJ SOLN
INTRAMUSCULAR | Status: DC | PRN
Start: 2021-09-05 — End: 2021-09-05
  Administered 2021-09-05: 4 mg via INTRAVENOUS

## 2021-09-05 MED ORDER — OXYCODONE HCL 5 MG PO TABS: 5.0000 mg | ORAL_TABLET | Freq: Once | ORAL | Status: DC | PRN

## 2021-09-05 MED ORDER — CEFTRIAXONE SODIUM 1 G IJ SOLR
INTRAMUSCULAR | Status: AC
  Filled 2021-09-05: qty 10

## 2021-09-05 MED ORDER — LIDOCAINE 2% (20 MG/ML) 5 ML SYRINGE
INTRAMUSCULAR | Status: DC | PRN
  Administered 2021-09-05: 100 mg via INTRAVENOUS

## 2021-09-05 MED ORDER — ACETAMINOPHEN 10 MG/ML IV SOLN: 1000.0000 mg | Freq: Once | INTRAVENOUS | Status: DC | PRN

## 2021-09-05 MED ORDER — ONDANSETRON HCL 4 MG/2ML IJ SOLN
INTRAMUSCULAR | Status: AC
  Filled 2021-09-05: qty 2

## 2021-09-05 MED ORDER — FENTANYL CITRATE (PF) 100 MCG/2ML IJ SOLN
INTRAMUSCULAR | Status: AC
  Filled 2021-09-05: qty 2

## 2021-09-05 MED ORDER — DEXAMETHASONE SODIUM PHOSPHATE 10 MG/ML IJ SOLN
INTRAMUSCULAR | Status: AC
  Filled 2021-09-05: qty 1

## 2021-09-05 MED ORDER — PROPOFOL 10 MG/ML IV BOLUS
INTRAVENOUS | Status: AC
  Filled 2021-09-05: qty 20

## 2021-09-05 SURGICAL SUPPLY — 21 items
CNTNR URN SCR LID CUP LEK RST (MISCELLANEOUS) ×1 IMPLANT
CONT SPEC 4OZ STRL OR WHT (MISCELLANEOUS) ×2
COVER BACK TABLE 60X90IN (DRAPES) ×2 IMPLANT
DRSG IV TEGADERM 3.5X4.5 STRL (GAUZE/BANDAGES/DRESSINGS) ×1 IMPLANT
DRSG TEGADERM 4X4.75 (GAUZE/BANDAGES/DRESSINGS) ×2 IMPLANT
DRSG TEGADERM 8X12 (GAUZE/BANDAGES/DRESSINGS) ×2 IMPLANT
GAUZE SPONGE 4X4 12PLY STRL (GAUZE/BANDAGES/DRESSINGS) ×1 IMPLANT
GAUZE SPONGE 4X4 12PLY STRL LF (GAUZE/BANDAGES/DRESSINGS) ×2 IMPLANT
GLOVE SURG ENC MOIS LTX SZ7.5 (GLOVE) ×2 IMPLANT
GLOVE SURG ENC MOIS LTX SZ8 (GLOVE) IMPLANT
GOWN STRL REUS W/TWL LRG LVL3 (GOWN DISPOSABLE) ×2 IMPLANT
IMPL SPACEOAR VUE SYSTEM (Spacer) ×1 IMPLANT
IMPLANT SPACEOAR VUE SYSTEM (Spacer) ×2 IMPLANT
KIT TURNOVER CYSTO (KITS) ×2 IMPLANT
MARKER GOLD PRELOAD 1.2X3 (Urological Implant) ×1 IMPLANT
NS IRRIG 500ML POUR BTL (IV SOLUTION) ×1 IMPLANT
SEED GOLD PRELOAD 1.2X3 (Urological Implant) ×2 IMPLANT
SURGILUBE 2OZ TUBE FLIPTOP (MISCELLANEOUS) ×2 IMPLANT
SYR CONTROL 10ML LL (SYRINGE) ×2 IMPLANT
Seed Gold Preload 1.2mm x 3mm ×1 IMPLANT
UNDERPAD 30X36 HEAVY ABSORB (UNDERPADS AND DIAPERS) ×2 IMPLANT

## 2021-09-05 NOTE — Anesthesia Procedure Notes (Addendum)
Procedure Name: LMA Insertion Date/Time: 09/05/2021 7:39 AM Performed by: Maryella Shivers, CRNA Pre-anesthesia Checklist: Patient identified, Emergency Drugs available, Suction available and Patient being monitored Patient Re-evaluated:Patient Re-evaluated prior to induction Oxygen Delivery Method: Circle system utilized Preoxygenation: Pre-oxygenation with 100% oxygen Induction Type: IV induction Ventilation: Mask ventilation without difficulty LMA: LMA inserted LMA Size: 5.0 Number of attempts: 1 Airway Equipment and Method: Bite block Placement Confirmation: positive ETCO2 Tube secured with: Tape Dental Injury: Teeth and Oropharynx as per pre-operative assessment

## 2021-09-05 NOTE — Interval H&P Note (Signed)
History and Physical Interval Note:  09/05/2021 7:28 AM  [name]  has presented today for surgery, with the diagnosis of PROSTATE CANCER.  The various methods of treatment have been discussed with the patient and family. After consideration of risks, benefits and other options for treatment, the patient has consented to  Procedure(s) with comments: GOLD SEED IMPLANT (N/A) - 13 MINS FOR CASE SPACE OAR INSTILLATION (N/A) as a surgical intervention.  The patient's history has been reviewed, patient examined, no change in status, stable for surgery.  I have reviewed the patient's chart and labs.  Questions were answered to the patient's satisfaction.  I drew patient picture of the relevant anatomy and discussed risk of urethral and rectal injury among others.  All questions answered.  He elects to proceed.   Festus Aloe

## 2021-09-05 NOTE — Op Note (Addendum)
Preoperative diagnosis: Prostate cancer Postoperative diagnosis: Prostate cancer  Procedure: Transrectal ultrasound-guided transperineal placement of gold seed prostate fiducial markers and SpaceOAR biodegradable gel  Surgeon: Junious Silk  Anesthesia: General  Indication for procedure: [name] is a 66 year old male with high risk prostate cancer who is preparing to start external beam radiation.  He presents today for the above.  Findings: Normal-appearing prostate without significant hypoechoic area and intact capsule. TURP defect noted. Seminal vesicles appeared normal.  Description of procedure: After consent was obtained patient brought to the operating room.  After adequate anesthesia he was placed in lithotomy position and the scrotum supported superiorly.  The perineum was prepped.  Ultrasound probe inserted rectally.  Prostate imaged in the axial and sagittal views.  The gold seed markers were passed transperineally under ultrasound guidance placing three total with one in the right base, right apex and left mid.  The 18-gauge needle was then inserted approximately 1 to 2 cm anterior to the anal opening and directed under ultrasonic guidance into the perirectal fat between the anterior rectal wall and the prostate capsule down to the mid-gland. Midline needle position was confirmed in the sagittal and axial views to verify the tip was in the perirectal fat.  Small amounts of saline were injected to hydrodissect the space between the prostate and the anterior rectal wall.  Axial imaging was viewed to confirm the needle was in the correct location in the mid gland and centered.  Aspiration confirmed no intravascular access.  The saline syringe was carefully disconnected maintaining the desired needle position and the hydrogel was attached to the needle.  Under ultrasound guidance in the sagittal view a smooth continuous injection was done over about 12 seconds delivering the hydrogel into the  space between the prostate and rectal wall.  The needle was withdrawn.  A dressing was placed and the patient was awakened and taken to the cover room in stable condition.  Complications: None  Blood loss: Minimal  Specimens: None  Drains: None  Disposition: Patient stable to PACU

## 2021-09-05 NOTE — Discharge Instructions (Signed)

## 2021-09-05 NOTE — Addendum Note (Signed)
Addendum  created 09/05/21 0945 by Maryella Shivers, CRNA   Flowsheet accepted

## 2021-09-05 NOTE — Anesthesia Preprocedure Evaluation (Signed)
Anesthesia Evaluation  Patient identified by MRN, date of birth, ID band Patient awake    Reviewed: Allergy & Precautions, NPO status , Patient's Chart, lab work & pertinent test results  Airway Mallampati: II  TM Distance: >3 FB Neck ROM: Full    Dental no notable dental hx.    Pulmonary neg pulmonary ROS,    Pulmonary exam normal breath sounds clear to auscultation       Cardiovascular hypertension, + CAD, + CABG and + Peripheral Vascular Disease  Normal cardiovascular exam Rhythm:Regular Rate:Normal     Neuro/Psych negative neurological ROS  negative psych ROS   GI/Hepatic negative GI ROS, Neg liver ROS,   Endo/Other  diabetes  Renal/GU Renal InsufficiencyRenal disease  negative genitourinary   Musculoskeletal  (+) Arthritis , Rheumatoid disorders,    Abdominal   Peds negative pediatric ROS (+)  Hematology negative hematology ROS (+)   Anesthesia Other Findings   Reproductive/Obstetrics negative OB ROS                             Anesthesia Physical Anesthesia Plan  ASA: 3  Anesthesia Plan: General   Post-op Pain Management:    Induction: Intravenous  PONV Risk Score and Plan: 2 and Ondansetron, Dexamethasone and Treatment may vary due to age or medical condition  Airway Management Planned: LMA  Additional Equipment:   Intra-op Plan:   Post-operative Plan: Extubation in OR  Informed Consent: I have reviewed the patients History and Physical, chart, labs and discussed the procedure including the risks, benefits and alternatives for the proposed anesthesia with the patient or authorized representative who has indicated his/her understanding and acceptance.     Dental advisory given  Plan Discussed with: CRNA and Surgeon  Anesthesia Plan Comments:         Anesthesia Quick Evaluation

## 2021-09-05 NOTE — Transfer of Care (Signed)
Immediate Anesthesia Transfer of Care Note  Patient: [name]  Procedure(s) Performed: GOLD SEED IMPLANT (Prostate) SPACE OAR INSTILLATION (Prostate)  Patient Location: PACU  Anesthesia Type:General  Level of Consciousness: sedated  Airway & Oxygen Therapy: Patient Spontanous Breathing and Patient connected to face mask oxygen  Post-op Assessment: Report given to RN and Post -op Vital signs reviewed and stable  Post vital signs: Reviewed and stable  Last Vitals:  Vitals Value Taken Time  BP 116/60 09/05/21 0815  Temp    Pulse 57 09/05/21 0815  Resp 13 09/05/21 0815  SpO2 100 % 09/05/21 0815  Vitals shown include unvalidated device data.  Last Pain:  Vitals:   09/05/21 0609  TempSrc: Oral  PainSc: 0-No pain      Patients Stated Pain Goal: 5 (41/96/22 2979)  Complications: No notable events documented.

## 2021-09-05 NOTE — Anesthesia Postprocedure Evaluation (Signed)
Anesthesia Post Note  Patient: [name]  Procedure(s) Performed: GOLD SEED IMPLANT (Prostate) SPACE OAR INSTILLATION (Prostate)     Patient location during evaluation: PACU Anesthesia Type: General Level of consciousness: awake and alert Pain management: pain level controlled Vital Signs Assessment: post-procedure vital signs reviewed and stable Respiratory status: spontaneous breathing, nonlabored ventilation, respiratory function stable and patient connected to nasal cannula oxygen Cardiovascular status: blood pressure returned to baseline and stable Postop Assessment: no apparent nausea or vomiting Anesthetic complications: no   No notable events documented.  Last Vitals:  Vitals:   09/05/21 0830 09/05/21 0845  BP:  (!) 148/58  Pulse: 72 63  Resp: 18 17  Temp:  (!) 36.4 C  SpO2: 100% 98%    Last Pain:  Vitals:   09/05/21 0845  TempSrc:   PainSc: 0-No pain                 Noma Quijas S

## 2021-09-08 ENCOUNTER — Encounter (HOSPITAL_BASED_OUTPATIENT_CLINIC_OR_DEPARTMENT_OTHER): Payer: Self-pay | Admitting: Urology

## 2021-09-08 ENCOUNTER — Telehealth: Payer: Self-pay | Admitting: *Deleted

## 2021-09-08 NOTE — Telephone Encounter (Signed)
CALLED PATIENT TO REMIND OF SIM APPT. FOR 09/09/21 - ARRIVAL TIME- 10:45 AM @ [facility], INFORMED PATIENT TO ARRIVE WITH FULL BLADDER AND EMPTY BOWEL, SPOKE WITH PATIENT AND HE VERIFIED UNDERSTANDING THIS APPT. AND IT'S INSTRUCTIONS

## 2021-09-09 ENCOUNTER — Other Ambulatory Visit: Payer: Self-pay

## 2021-09-09 ENCOUNTER — Ambulatory Visit
Admission: RE | Admit: 2021-09-09 | Discharge: 2021-09-09 | Disposition: A | Discharge status: Home / Self Care | Payer: Medicare Other | Source: Ambulatory Visit | Attending: Radiation Oncology | Admitting: Radiation Oncology

## 2021-09-09 DIAGNOSIS — C61 Malignant neoplasm of prostate: Secondary | ICD-10-CM | POA: Diagnosis not present

## 2021-09-09 NOTE — Progress Notes (Signed)
°  Radiation Oncology         [phone] ________________________________  Name: [name] MRN: [id number]  Date: 09/09/2021  DOB: [birth year]  SIMULATION AND TREATMENT PLANNING NOTE    ICD-10-CM   1. Prostate cancer [facility])  C61       DIAGNOSIS:  66 y.o. gentleman with stage T1c adenocarcinoma of the prostate with a Gleason's score of 4+5 and a PSA of 12.2  NARRATIVE:  The patient was brought to the [facility].  Identity was confirmed.  All relevant records and images related to the planned course of therapy were reviewed.  The patient freely provided informed written consent to proceed with treatment after reviewing the details related to the planned course of therapy. The consent form was witnessed and verified by the simulation staff.  Then, the patient was set-up in a stable reproducible supine position for radiation therapy.  A vacuum lock pillow device was custom fabricated to position his legs in a reproducible immobilized position.  Then, I performed a urethrogram under sterile conditions to identify the prostatic bed.  CT images were obtained.  Surface markings were placed.  The CT images were loaded into the planning software.  Then the prostate bed target, pelvic lymph node target and avoidance structures including the rectum, bladder, bowel and hips were contoured.  Treatment planning then occurred.  The radiation prescription was entered and confirmed.  A total of one complex treatment devices were fabricated. I have requested : Intensity Modulated Radiotherapy (IMRT) is medically necessary for this case for the following reason:  Rectal sparing.[name]  PLAN:  The patient will receive 45 Gy in 25 fractions of 1.8 Gy, followed by a boost to the prostate and SV invasion to a total dose of 75 Gy with 15 additional fractions of 2 Gy.   ________________________________  Sheral Apley Tammi Klippel, M.D.

## 2021-09-10 ENCOUNTER — Other Ambulatory Visit: Payer: Self-pay | Admitting: Endocrinology

## 2021-09-11 DIAGNOSIS — Z510 Encounter for antineoplastic radiation therapy: Secondary | ICD-10-CM | POA: Diagnosis not present

## 2021-09-11 DIAGNOSIS — C61 Malignant neoplasm of prostate: Secondary | ICD-10-CM | POA: Insufficient documentation

## 2021-09-18 ENCOUNTER — Other Ambulatory Visit: Payer: Self-pay

## 2021-09-18 ENCOUNTER — Ambulatory Visit
Admission: RE | Admit: 2021-09-18 | Discharge: 2021-09-18 | Disposition: A | Discharge status: Home / Self Care | Payer: Medicare Other | Source: Ambulatory Visit | Attending: Radiation Oncology | Admitting: Radiation Oncology

## 2021-09-18 DIAGNOSIS — Z510 Encounter for antineoplastic radiation therapy: Secondary | ICD-10-CM | POA: Diagnosis not present

## 2021-09-19 ENCOUNTER — Ambulatory Visit
Admission: RE | Admit: 2021-09-19 | Discharge: 2021-09-19 | Disposition: A | Discharge status: Home / Self Care | Payer: Medicare Other | Source: Ambulatory Visit | Attending: Radiation Oncology | Admitting: Radiation Oncology

## 2021-09-19 DIAGNOSIS — Z510 Encounter for antineoplastic radiation therapy: Secondary | ICD-10-CM | POA: Diagnosis not present

## 2021-09-22 ENCOUNTER — Other Ambulatory Visit: Payer: Self-pay

## 2021-09-22 ENCOUNTER — Ambulatory Visit
Admission: RE | Admit: 2021-09-22 | Discharge: 2021-09-22 | Disposition: A | Discharge status: Home / Self Care | Payer: Medicare Other | Source: Ambulatory Visit | Attending: Radiation Oncology | Admitting: Radiation Oncology

## 2021-09-22 ENCOUNTER — Encounter: Payer: Self-pay | Admitting: Internal Medicine

## 2021-09-22 DIAGNOSIS — Z510 Encounter for antineoplastic radiation therapy: Secondary | ICD-10-CM | POA: Diagnosis not present

## 2021-09-23 ENCOUNTER — Ambulatory Visit
Admission: RE | Admit: 2021-09-23 | Discharge: 2021-09-23 | Disposition: A | Discharge status: Home / Self Care | Payer: Medicare Other | Source: Ambulatory Visit | Attending: Radiation Oncology | Admitting: Radiation Oncology

## 2021-09-23 DIAGNOSIS — Z510 Encounter for antineoplastic radiation therapy: Secondary | ICD-10-CM | POA: Diagnosis not present

## 2021-09-24 ENCOUNTER — Other Ambulatory Visit: Payer: Self-pay

## 2021-09-24 ENCOUNTER — Ambulatory Visit
Admission: RE | Admit: 2021-09-24 | Discharge: 2021-09-24 | Disposition: A | Discharge status: Home / Self Care | Payer: Medicare Other | Source: Ambulatory Visit | Attending: Radiation Oncology | Admitting: Radiation Oncology

## 2021-09-24 DIAGNOSIS — Z510 Encounter for antineoplastic radiation therapy: Secondary | ICD-10-CM | POA: Diagnosis not present

## 2021-09-25 ENCOUNTER — Ambulatory Visit
Admission: RE | Admit: 2021-09-25 | Discharge: 2021-09-25 | Disposition: A | Discharge status: Home / Self Care | Payer: Medicare Other | Source: Ambulatory Visit | Attending: Radiation Oncology | Admitting: Radiation Oncology

## 2021-09-25 DIAGNOSIS — Z510 Encounter for antineoplastic radiation therapy: Secondary | ICD-10-CM | POA: Diagnosis not present

## 2021-09-26 ENCOUNTER — Ambulatory Visit
Admission: RE | Admit: 2021-09-26 | Discharge: 2021-09-26 | Disposition: A | Discharge status: Home / Self Care | Payer: Medicare Other | Source: Ambulatory Visit | Attending: Radiation Oncology | Admitting: Radiation Oncology

## 2021-09-26 ENCOUNTER — Other Ambulatory Visit: Payer: Self-pay

## 2021-09-26 DIAGNOSIS — Z510 Encounter for antineoplastic radiation therapy: Secondary | ICD-10-CM | POA: Diagnosis not present

## 2021-09-29 ENCOUNTER — Other Ambulatory Visit: Payer: Self-pay

## 2021-09-29 ENCOUNTER — Ambulatory Visit: Admission: RE | Admit: 2021-09-29 | Payer: Medicare Other | Source: Ambulatory Visit

## 2021-09-30 ENCOUNTER — Ambulatory Visit
Admission: RE | Admit: 2021-09-30 | Discharge: 2021-09-30 | Disposition: A | Discharge status: Home / Self Care | Payer: Medicare Other | Source: Ambulatory Visit | Attending: Radiation Oncology | Admitting: Radiation Oncology

## 2021-09-30 DIAGNOSIS — Z510 Encounter for antineoplastic radiation therapy: Secondary | ICD-10-CM | POA: Diagnosis not present

## 2021-10-01 ENCOUNTER — Other Ambulatory Visit: Payer: Self-pay

## 2021-10-01 ENCOUNTER — Ambulatory Visit
Admission: RE | Admit: 2021-10-01 | Discharge: 2021-10-01 | Disposition: A | Discharge status: Home / Self Care | Payer: Medicare Other | Source: Ambulatory Visit | Attending: Radiation Oncology | Admitting: Radiation Oncology

## 2021-10-01 DIAGNOSIS — Z510 Encounter for antineoplastic radiation therapy: Secondary | ICD-10-CM | POA: Diagnosis not present

## 2021-10-02 ENCOUNTER — Ambulatory Visit
Admission: RE | Admit: 2021-10-02 | Discharge: 2021-10-02 | Disposition: A | Discharge status: Home / Self Care | Payer: Medicare Other | Source: Ambulatory Visit | Attending: Radiation Oncology | Admitting: Radiation Oncology

## 2021-10-02 DIAGNOSIS — Z510 Encounter for antineoplastic radiation therapy: Secondary | ICD-10-CM | POA: Diagnosis not present

## 2021-10-03 ENCOUNTER — Other Ambulatory Visit: Payer: Self-pay

## 2021-10-03 ENCOUNTER — Ambulatory Visit
Admission: RE | Admit: 2021-10-03 | Discharge: 2021-10-03 | Disposition: A | Discharge status: Home / Self Care | Payer: Medicare Other | Source: Ambulatory Visit | Attending: Radiation Oncology | Admitting: Radiation Oncology

## 2021-10-03 DIAGNOSIS — Z510 Encounter for antineoplastic radiation therapy: Secondary | ICD-10-CM | POA: Diagnosis not present

## 2021-10-06 ENCOUNTER — Ambulatory Visit
Admission: RE | Admit: 2021-10-06 | Discharge: 2021-10-06 | Disposition: A | Discharge status: Home / Self Care | Payer: Medicare Other | Source: Ambulatory Visit | Attending: Radiation Oncology | Admitting: Radiation Oncology

## 2021-10-06 DIAGNOSIS — Z510 Encounter for antineoplastic radiation therapy: Secondary | ICD-10-CM | POA: Diagnosis not present

## 2021-10-06 NOTE — Progress Notes (Signed)
Patient presented to [facility] on 12/27 for Stage T1c adenocarcinoma of the prostate with a Gleason score of 4+5 and a PSA of 12.2. ? ?Patient has received ADT and is currently undergoing daily radiation treatment.  ? ?Left message for call back to assess any navigational needs.  ?

## 2021-10-07 ENCOUNTER — Ambulatory Visit
Admission: RE | Admit: 2021-10-07 | Discharge: 2021-10-07 | Disposition: A | Discharge status: Home / Self Care | Payer: Medicare Other | Source: Ambulatory Visit | Attending: Radiation Oncology | Admitting: Radiation Oncology

## 2021-10-07 ENCOUNTER — Other Ambulatory Visit: Payer: Self-pay

## 2021-10-07 DIAGNOSIS — Z510 Encounter for antineoplastic radiation therapy: Secondary | ICD-10-CM | POA: Diagnosis not present

## 2021-10-08 ENCOUNTER — Ambulatory Visit
Admission: RE | Admit: 2021-10-08 | Discharge: 2021-10-08 | Disposition: A | Discharge status: Home / Self Care | Payer: Medicare Other | Source: Ambulatory Visit | Attending: Radiation Oncology | Admitting: Radiation Oncology

## 2021-10-08 DIAGNOSIS — Z510 Encounter for antineoplastic radiation therapy: Secondary | ICD-10-CM | POA: Diagnosis not present

## 2021-10-09 ENCOUNTER — Ambulatory Visit
Admission: RE | Admit: 2021-10-09 | Discharge: 2021-10-09 | Disposition: A | Discharge status: Home / Self Care | Payer: Medicare Other | Source: Ambulatory Visit | Attending: Radiation Oncology | Admitting: Radiation Oncology

## 2021-10-09 ENCOUNTER — Other Ambulatory Visit: Payer: Self-pay

## 2021-10-09 DIAGNOSIS — Z510 Encounter for antineoplastic radiation therapy: Secondary | ICD-10-CM | POA: Diagnosis not present

## 2021-10-10 ENCOUNTER — Ambulatory Visit
Admission: RE | Admit: 2021-10-10 | Discharge: 2021-10-10 | Disposition: A | Discharge status: Home / Self Care | Payer: Medicare Other | Source: Ambulatory Visit | Attending: Radiation Oncology | Admitting: Radiation Oncology

## 2021-10-10 DIAGNOSIS — Z510 Encounter for antineoplastic radiation therapy: Secondary | ICD-10-CM | POA: Diagnosis not present

## 2021-10-13 ENCOUNTER — Other Ambulatory Visit: Payer: Self-pay

## 2021-10-13 ENCOUNTER — Ambulatory Visit
Admission: RE | Admit: 2021-10-13 | Discharge: 2021-10-13 | Disposition: A | Discharge status: Home / Self Care | Payer: Medicare Other | Source: Ambulatory Visit | Attending: Radiation Oncology | Admitting: Radiation Oncology

## 2021-10-13 DIAGNOSIS — Z510 Encounter for antineoplastic radiation therapy: Secondary | ICD-10-CM | POA: Insufficient documentation

## 2021-10-13 DIAGNOSIS — C61 Malignant neoplasm of prostate: Secondary | ICD-10-CM | POA: Insufficient documentation

## 2021-10-14 ENCOUNTER — Ambulatory Visit
Admission: RE | Admit: 2021-10-14 | Discharge: 2021-10-14 | Disposition: A | Discharge status: Home / Self Care | Payer: Medicare Other | Source: Ambulatory Visit | Attending: Radiation Oncology | Admitting: Radiation Oncology

## 2021-10-14 DIAGNOSIS — Z510 Encounter for antineoplastic radiation therapy: Secondary | ICD-10-CM | POA: Diagnosis not present

## 2021-10-15 ENCOUNTER — Other Ambulatory Visit: Payer: Self-pay

## 2021-10-15 ENCOUNTER — Ambulatory Visit
Admission: RE | Admit: 2021-10-15 | Discharge: 2021-10-15 | Disposition: A | Discharge status: Home / Self Care | Payer: Medicare Other | Source: Ambulatory Visit | Attending: Radiation Oncology | Admitting: Radiation Oncology

## 2021-10-15 DIAGNOSIS — Z510 Encounter for antineoplastic radiation therapy: Secondary | ICD-10-CM | POA: Diagnosis not present

## 2021-10-16 ENCOUNTER — Ambulatory Visit
Admission: RE | Admit: 2021-10-16 | Discharge: 2021-10-16 | Disposition: A | Discharge status: Home / Self Care | Payer: Medicare Other | Source: Ambulatory Visit | Attending: Radiation Oncology | Admitting: Radiation Oncology

## 2021-10-16 DIAGNOSIS — Z510 Encounter for antineoplastic radiation therapy: Secondary | ICD-10-CM | POA: Diagnosis not present

## 2021-10-17 ENCOUNTER — Other Ambulatory Visit: Payer: Self-pay

## 2021-10-17 ENCOUNTER — Ambulatory Visit
Admission: RE | Admit: 2021-10-17 | Discharge: 2021-10-17 | Disposition: A | Discharge status: Home / Self Care | Payer: Medicare Other | Source: Ambulatory Visit | Attending: Radiation Oncology | Admitting: Radiation Oncology

## 2021-10-17 ENCOUNTER — Other Ambulatory Visit: Payer: Self-pay | Admitting: Endocrinology

## 2021-10-17 DIAGNOSIS — Z510 Encounter for antineoplastic radiation therapy: Secondary | ICD-10-CM | POA: Diagnosis not present

## 2021-10-20 ENCOUNTER — Other Ambulatory Visit: Payer: Self-pay

## 2021-10-20 ENCOUNTER — Ambulatory Visit
Admission: RE | Admit: 2021-10-20 | Discharge: 2021-10-20 | Disposition: A | Discharge status: Home / Self Care | Payer: Medicare Other | Source: Ambulatory Visit | Attending: Radiation Oncology | Admitting: Radiation Oncology

## 2021-10-20 DIAGNOSIS — Z510 Encounter for antineoplastic radiation therapy: Secondary | ICD-10-CM | POA: Diagnosis not present

## 2021-10-21 ENCOUNTER — Ambulatory Visit
Admission: RE | Admit: 2021-10-21 | Discharge: 2021-10-21 | Disposition: A | Discharge status: Home / Self Care | Payer: Medicare Other | Source: Ambulatory Visit | Attending: Radiation Oncology | Admitting: Radiation Oncology

## 2021-10-21 DIAGNOSIS — Z510 Encounter for antineoplastic radiation therapy: Secondary | ICD-10-CM | POA: Diagnosis not present

## 2021-10-22 ENCOUNTER — Ambulatory Visit
Admission: RE | Admit: 2021-10-22 | Discharge: 2021-10-22 | Disposition: A | Discharge status: Home / Self Care | Payer: Medicare Other | Source: Ambulatory Visit | Attending: Radiation Oncology | Admitting: Radiation Oncology

## 2021-10-22 ENCOUNTER — Other Ambulatory Visit: Payer: Self-pay

## 2021-10-22 DIAGNOSIS — Z510 Encounter for antineoplastic radiation therapy: Secondary | ICD-10-CM | POA: Diagnosis not present

## 2021-10-23 ENCOUNTER — Ambulatory Visit: Payer: Medicare Other

## 2021-10-23 DIAGNOSIS — Z510 Encounter for antineoplastic radiation therapy: Secondary | ICD-10-CM | POA: Diagnosis not present

## 2021-10-24 ENCOUNTER — Ambulatory Visit: Payer: Medicare Other

## 2021-10-24 ENCOUNTER — Other Ambulatory Visit: Payer: Self-pay

## 2021-10-24 DIAGNOSIS — Z510 Encounter for antineoplastic radiation therapy: Secondary | ICD-10-CM | POA: Diagnosis not present

## 2021-10-25 ENCOUNTER — Other Ambulatory Visit: Payer: Self-pay | Admitting: Cardiology

## 2021-10-27 ENCOUNTER — Other Ambulatory Visit: Payer: Self-pay

## 2021-10-27 ENCOUNTER — Ambulatory Visit
Admission: RE | Admit: 2021-10-27 | Discharge: 2021-10-27 | Disposition: A | Discharge status: Home / Self Care | Payer: Medicare Other | Source: Ambulatory Visit | Attending: Radiation Oncology | Admitting: Radiation Oncology

## 2021-10-27 ENCOUNTER — Other Ambulatory Visit (INDEPENDENT_AMBULATORY_CARE_PROVIDER_SITE_OTHER): Payer: Medicare Other

## 2021-10-27 DIAGNOSIS — E1165 Type 2 diabetes mellitus with hyperglycemia: Secondary | ICD-10-CM | POA: Diagnosis not present

## 2021-10-27 DIAGNOSIS — N1831 Chronic kidney disease, stage 3a: Secondary | ICD-10-CM

## 2021-10-27 DIAGNOSIS — Z510 Encounter for antineoplastic radiation therapy: Secondary | ICD-10-CM | POA: Diagnosis not present

## 2021-10-27 LAB — BASIC METABOLIC PANEL
BUN: 18 mg/dL (ref 6–23)
CO2: 24 mEq/L (ref 19–32)
Calcium: 8.8 mg/dL (ref 8.4–10.5)
Chloride: 109 mEq/L (ref 96–112)
Creatinine, Ser: 1.31 mg/dL (ref 0.40–1.50)
GFR: 56.79 mL/min — ABNORMAL LOW (ref >60.00)
Glucose, Bld: 146 mg/dL — ABNORMAL HIGH (ref 70–99)
Potassium: 3.5 mEq/L (ref 3.5–5.1)
Sodium: 141 mEq/L (ref 135–145)

## 2021-10-27 LAB — HEMOGLOBIN A1C: Hgb A1c MFr Bld: 7.7 % — ABNORMAL HIGH (ref 4.6–6.5)

## 2021-10-28 ENCOUNTER — Other Ambulatory Visit: Payer: Self-pay

## 2021-10-28 ENCOUNTER — Ambulatory Visit
Admission: RE | Admit: 2021-10-28 | Discharge: 2021-10-28 | Disposition: A | Discharge status: Home / Self Care | Payer: Medicare Other | Source: Ambulatory Visit | Attending: Radiation Oncology | Admitting: Radiation Oncology

## 2021-10-28 DIAGNOSIS — Z510 Encounter for antineoplastic radiation therapy: Secondary | ICD-10-CM | POA: Diagnosis not present

## 2021-10-28 LAB — RAD ONC ARIA SESSION SUMMARY
Course Elapsed Days: 40
Plan Fractions Treated to Date: 3
Plan Prescribed Dose Per Fraction: 2 Gy
Plan Total Fractions Prescribed: 15
Plan Total Prescribed Dose: 30 Gy
Reference Point Dosage Given to Date: 51 Gy
Reference Point Session Dosage Given: 2 Gy
Session Number: 28

## 2021-10-29 ENCOUNTER — Other Ambulatory Visit: Payer: Self-pay

## 2021-10-29 ENCOUNTER — Ambulatory Visit
Admission: RE | Admit: 2021-10-29 | Discharge: 2021-10-29 | Disposition: A | Discharge status: Home / Self Care | Payer: Medicare Other | Source: Ambulatory Visit | Attending: Radiation Oncology | Admitting: Radiation Oncology

## 2021-10-29 DIAGNOSIS — Z510 Encounter for antineoplastic radiation therapy: Secondary | ICD-10-CM | POA: Diagnosis not present

## 2021-10-29 LAB — RAD ONC ARIA SESSION SUMMARY
Course Elapsed Days: 41
Plan Fractions Treated to Date: 4
Plan Prescribed Dose Per Fraction: 2 Gy
Plan Total Fractions Prescribed: 15
Plan Total Prescribed Dose: 30 Gy
Reference Point Dosage Given to Date: 53 Gy
Reference Point Session Dosage Given: 2 Gy
Session Number: 29

## 2021-10-30 ENCOUNTER — Encounter: Payer: Self-pay | Admitting: Endocrinology

## 2021-10-30 ENCOUNTER — Ambulatory Visit (INDEPENDENT_AMBULATORY_CARE_PROVIDER_SITE_OTHER): Payer: Medicare Other | Admitting: Endocrinology

## 2021-10-30 ENCOUNTER — Other Ambulatory Visit: Payer: Self-pay

## 2021-10-30 ENCOUNTER — Ambulatory Visit
Admission: RE | Admit: 2021-10-30 | Discharge: 2021-10-30 | Disposition: A | Discharge status: Home / Self Care | Payer: Medicare Other | Source: Ambulatory Visit | Attending: Radiation Oncology | Admitting: Radiation Oncology

## 2021-10-30 VITALS — BP 142/70 | HR 82 | Ht 69.5 in | Wt 180.8 lb

## 2021-10-30 DIAGNOSIS — Z510 Encounter for antineoplastic radiation therapy: Secondary | ICD-10-CM | POA: Diagnosis not present

## 2021-10-30 DIAGNOSIS — E1165 Type 2 diabetes mellitus with hyperglycemia: Secondary | ICD-10-CM | POA: Diagnosis not present

## 2021-10-30 LAB — RAD ONC ARIA SESSION SUMMARY
Course Elapsed Days: 42
Plan Fractions Treated to Date: 5
Plan Prescribed Dose Per Fraction: 2 Gy
Plan Total Fractions Prescribed: 15
Plan Total Prescribed Dose: 30 Gy
Reference Point Dosage Given to Date: 55 Gy
Reference Point Session Dosage Given: 2 Gy
Session Number: 30

## 2021-10-30 NOTE — Patient Instructions (Signed)
Check blood sugars on waking up 1-2 days a week ? ?Also check blood sugars about 2 hours after meals and do this after different meals by rotation ? ?Recommended blood sugar levels on waking up are 90-130 and about 2 hours after meal is 130-180 ? ?Please bring your blood sugar monitor to each visit, thank you ? ?

## 2021-10-30 NOTE — Progress Notes (Signed)
Patient ID: [name], male   DOB: Nov 27, 1955, 66 y.o.   MRN: [id number] ? ?       ? ? ?Reason for Appointment: Follow-up for Type 2 Diabetes ? ? ?History of Present Illness:  ?        ?Date of diagnosis of type 2 diabetes mellitus:   2018     ? ?Background history:  ?He has never been told to have diabetes but review of his previous labs indicate that he had high blood sugar 179 in 2014 ?His highest sugar has been 234 in 10/2016 when his A1c was 8.8, not clear if he was symptomatic at that time ? ?Recent history:  ? ?A1c is slightly improved at 7.7 compared to 8 ? ? ?Non-insulin hypoglycemic drugs the patient is taking are:   Prandin 2 mg at meals ? ?Current management, blood sugar patterns and problems identified: ? ?He did bring his monitor for download ?He has checked his blood sugars very infrequently again and only has 2 recent readings only in the morning  ?He was told to start [place] or Invokana on the last visit depending on his insurance coverage but he is not taking any SGLT2 drugs ?This may be likely from his high co-pay ?Blood sugars do not appear to be high in the mornings but he has not checked any readings after meals ?Lab glucose 146 late morning ?He believes he is taking his Prandin regularly at meals  ?Previously metformin had been discontinued ?His weight is again about the same ?Not able to do much walking for exercise ?         ? ?Glucose monitoring:   has Accu-Chek guide meter ? ?Blood sugars from review of monitor shows blood sugars 79 and 112 fasting this week ? ?PREVIOUS blood sugar range 98-124, AVERAGE 127 for 30 days ?Only 1 reading at bedtime of 154 ? ?Previous  ?Dietician visit, most recent: 2/21 ? ?Weight history: ? ?Wt Readings from Last 3 Encounters:  ?10/30/21 180 lb 12.8 oz (82 kg)  ?09/05/21 180 lb 14.4 oz (82.1 kg)  ?07/29/21 178 lb 6.4 oz (80.9 kg)  ? ? ?Glycemic control: ?  ?Lab Results  ?Component Value Date  ? HGBA1C 7.7 (H) 10/27/2021  ? HGBA1C 8.0 (H) 07/22/2021   ? HGBA1C 9.7 (A) 05/22/2021  ? ?Lab Results  ?Component Value Date  ? MICROALBUR 12.6 (H) 07/21/2018  ? [place] 39 09/04/2019  ? CREATININE 1.31 10/27/2021  ? ?Lab Results  ?Component Value Date  ? MICRALBCREAT 10.0 07/21/2018  ? ? ?Lab Results  ?Component Value Date  ? FRUCTOSAMINE 297 (H) 07/22/2021  ? FRUCTOSAMINE 347 (H) 04/14/2021  ? FRUCTOSAMINE 237 11/08/2020  ? ?Lab Results  ?Component Value Date  ? HGB 12.9 (L) 09/05/2021  ? ? ?Orders Only on 10/30/2021  ?Component Date Value Ref Range Status  ? Course ID 10/30/2021 [email or web address]   Final  ? Course Intent 10/30/2021 Unknown   Final  ? Course Start Date 10/30/2021 09/09/2021 12:01 PM   Final  ? Session Number 10/30/2021 30   Final  ? Course First Treatment Date 10/30/2021 09/18/2021 11:30 AM   Final  ? Course Last Treatment Date 10/30/2021 10/30/2021  9:21 AM   Final  ? Course Elapsed Days 10/30/2021 42   Final  ? Reference Point ID 10/30/2021 Prostate DP   Final  ? Reference Point Dosage Given to Da* 10/30/2021 55  Gy Final  ? Reference Point Session Dosage Giv* 10/30/2021 2  Gy Final  ?  Plan ID 10/30/2021 Prostate_Bst   Final  ? Plan Fractions Treated to Date 10/30/2021 5   Final  ? Plan Total Fractions Prescribed 10/30/2021 15   Final  ? Plan Prescribed Dose Per Fraction 10/30/2021 2  Gy Final  ? Plan Total Prescribed Dose 10/30/2021 30.000000  Gy Final  ? Plan Primary Reference Point 10/30/2021 Prostate DP   Final  ?Orders Only on 10/29/2021  ?Component Date Value Ref Range Status  ? Course ID 10/29/2021 [email or web address]   Final  ? Course Intent 10/29/2021 Unknown   Final  ? Course Start Date 10/29/2021 09/09/2021 12:01 PM   Final  ? Session Number 10/29/2021 29   Final  ? Course First Treatment Date 10/29/2021 09/18/2021 11:30 AM   Final  ? Course Last Treatment Date 10/29/2021 10/29/2021  9:42 AM   Final  ? Course Elapsed Days 10/29/2021 41   Final  ? Reference Point ID 10/29/2021 Prostate DP   Final  ? Reference Point Dosage Given to Da* 10/29/2021 53  Gy Final  ?  Reference Point Session Dosage Giv* 10/29/2021 2  Gy Final  ? Plan ID 10/29/2021 Prostate_Bst   Final  ? Plan Fractions Treated to Date 10/29/2021 4   Final  ? Plan Total Fractions Prescribed 10/29/2021 15   Final  ? Plan Prescribed Dose Per Fraction 10/29/2021 2  Gy Final  ? Plan Total Prescribed Dose 10/29/2021 30.000000  Gy Final  ? Plan Primary Reference Point 10/29/2021 Prostate DP   Final  ?Orders Only on 10/28/2021  ?Component Date Value Ref Range Status  ? Course ID 10/28/2021 [email or web address]   Final  ? Course Intent 10/28/2021 Unknown   Final  ? Course Start Date 10/28/2021 09/09/2021 12:01 PM   Final  ? Session Number 10/28/2021 28   Final  ? Course First Treatment Date 10/28/2021 09/18/2021 11:30 AM   Final  ? Course Last Treatment Date 10/28/2021 10/28/2021  9:31 AM   Final  ? Course Elapsed Days 10/28/2021 40   Final  ? Reference Point ID 10/28/2021 Prostate DP   Final  ? Reference Point Dosage Given to Da* 10/28/2021 51  Gy Final  ? Reference Point Session Dosage Giv* 10/28/2021 2  Gy Final  ? Plan ID 10/28/2021 Prostate_Bst   Final  ? Plan Fractions Treated to Date 10/28/2021 3   Final  ? Plan Total Fractions Prescribed 10/28/2021 15   Final  ? Plan Prescribed Dose Per Fraction 10/28/2021 2  Gy Final  ? Plan Total Prescribed Dose 10/28/2021 30.000000  Gy Final  ? Plan Primary Reference Point 10/28/2021 Prostate DP   Final  ?Lab on 10/27/2021  ?Component Date Value Ref Range Status  ? Sodium 10/27/2021 141  135 - 145 mEq/L Final  ? Potassium 10/27/2021 3.5  3.5 - 5.1 mEq/L Final  ? Chloride 10/27/2021 109  96 - 112 mEq/L Final  ? CO2 10/27/2021 24  19 - 32 mEq/L Final  ? Glucose, Bld 10/27/2021 146 (H)  70 - 99 mg/dL Final  ? BUN 10/27/2021 18  6 - 23 mg/dL Final  ? Creatinine, Ser 10/27/2021 1.31  0.40 - 1.50 mg/dL Final  ? GFR 10/27/2021 56.79 (L)  >60.00 mL/min Final  ? Calculated using the CKD-EPI Creatinine Equation (2021)  ? Calcium 10/27/2021 8.8  8.4 - 10.5 mg/dL Final  ? Hgb A1c MFr Bld 10/27/2021 7.7  (H)  4.6 - 6.5 % Final  ? Glycemic Control Guidelines for People with Diabetes:Non Diabetic:  <6%Goal of Therapy: <7%Additional  Action Suggested:  >8%   ? ? ?Allergies as of 10/30/2021   ? ?   Reactions  ? Metformin And Related Diarrhea  ? ?  ? ?  ?Medication List  ?  ? ?  ? Accurate as of October 30, 2021 10:40 AM. If you have any questions, ask your nurse or doctor.  ?  ?  ? ?  ? ?Accu-Chek Guide Me w/Device Kit ?1 each by Does not apply route. ?  ?Accu-Chek Guide test strip ?Generic drug: glucose blood ?Use Accu Chek test strips as instructed to check blood sugar fasting or two hours after meals, every other day. ?  ?Accu-Chek Softclix Lancets lancets ?Use Accu Chek softclix lancets to check blood sugar fasting or 2 hours after a meal every other day. ?  ?amLODipine 5 MG tablet ?Commonly known as: NORVASC ?Take 1 tablet (5 mg total) by mouth daily. ?  ?aspirin EC 81 MG tablet ?Take 81 mg by mouth daily. Swallow whole. ?  ?atorvastatin 80 MG tablet ?Commonly known as: LIPITOR ?Take 1 tablet by mouth once daily ?  ?canagliflozin 100 MG Tabs tablet ?Commonly known as: Invokana ?1 tablet before breakfast ?What changed: how much to take ?  ?carvedilol 25 MG tablet ?Commonly known as: COREG ?Take 1 tablet (25 mg total) by mouth 2 (two) times daily with a meal. ?  ?dapagliflozin propanediol 5 MG Tabs tablet ?Commonly known as: [place] ?Take 1 tablet (5 mg total) by mouth daily. ?  ?hydrALAZINE 50 MG tablet ?Commonly known as: APRESOLINE ?TAKE 1 TABLET BY MOUTH THREE TIMES DAILY ?  ?hydroxypropyl methylcellulose / hypromellose 2.5 % ophthalmic solution ?Commonly known as: ISOPTO TEARS / GONIOVISC ?Place 2 drops into the left eye as needed for dry eyes. ?  ?isosorbide mononitrate 30 MG 24 hr tablet ?Commonly known as: IMDUR ?Take 1 tablet by mouth once daily ?  ?Klor-Con M20 20 MEQ tablet ?Generic drug: potassium chloride SA ?Take 1 tablet by mouth once daily ?  ?repaglinide 2 MG tablet ?Commonly known as: PRANDIN ?Take 1  tablet (2 mg total) by mouth 3 (three) times daily before meals. ?  ? ?  ? ? ?Allergies:  ?Allergies  ?Allergen Reactions  ? Metformin And Related Diarrhea  ? ? ?Past Medical History:  ?Diagnosis Date  ?

## 2021-10-31 ENCOUNTER — Other Ambulatory Visit: Payer: Self-pay

## 2021-10-31 ENCOUNTER — Ambulatory Visit
Admission: RE | Admit: 2021-10-31 | Discharge: 2021-10-31 | Disposition: A | Discharge status: Home / Self Care | Payer: Medicare Other | Source: Ambulatory Visit | Attending: Radiation Oncology | Admitting: Radiation Oncology

## 2021-10-31 DIAGNOSIS — Z510 Encounter for antineoplastic radiation therapy: Secondary | ICD-10-CM | POA: Diagnosis not present

## 2021-10-31 LAB — RAD ONC ARIA SESSION SUMMARY
Course Elapsed Days: 43
Plan Fractions Treated to Date: 6
Plan Prescribed Dose Per Fraction: 2 Gy
Plan Total Fractions Prescribed: 15
Plan Total Prescribed Dose: 30 Gy
Reference Point Dosage Given to Date: 57 Gy
Reference Point Session Dosage Given: 2 Gy
Session Number: 31

## 2021-11-03 ENCOUNTER — Ambulatory Visit
Admission: RE | Admit: 2021-11-03 | Discharge: 2021-11-03 | Disposition: A | Discharge status: Home / Self Care | Payer: Medicare Other | Source: Ambulatory Visit | Attending: Radiation Oncology | Admitting: Radiation Oncology

## 2021-11-03 ENCOUNTER — Other Ambulatory Visit: Payer: Self-pay

## 2021-11-03 DIAGNOSIS — Z510 Encounter for antineoplastic radiation therapy: Secondary | ICD-10-CM | POA: Diagnosis not present

## 2021-11-03 LAB — RAD ONC ARIA SESSION SUMMARY
Course Elapsed Days: 46
Plan Fractions Treated to Date: 7
Plan Prescribed Dose Per Fraction: 2 Gy
Plan Total Fractions Prescribed: 15
Plan Total Prescribed Dose: 30 Gy
Reference Point Dosage Given to Date: 59 Gy
Reference Point Session Dosage Given: 2 Gy
Session Number: 32

## 2021-11-04 ENCOUNTER — Other Ambulatory Visit: Payer: Self-pay

## 2021-11-04 ENCOUNTER — Ambulatory Visit
Admission: RE | Admit: 2021-11-04 | Discharge: 2021-11-04 | Disposition: A | Discharge status: Home / Self Care | Payer: Medicare Other | Source: Ambulatory Visit | Attending: Radiation Oncology | Admitting: Radiation Oncology

## 2021-11-04 DIAGNOSIS — Z510 Encounter for antineoplastic radiation therapy: Secondary | ICD-10-CM | POA: Diagnosis not present

## 2021-11-04 LAB — RAD ONC ARIA SESSION SUMMARY
Course Elapsed Days: 47
Plan Fractions Treated to Date: 8
Plan Prescribed Dose Per Fraction: 2 Gy
Plan Total Fractions Prescribed: 15
Plan Total Prescribed Dose: 30 Gy
Reference Point Dosage Given to Date: 61 Gy
Reference Point Session Dosage Given: 2 Gy
Session Number: 33

## 2021-11-05 ENCOUNTER — Telehealth: Payer: Self-pay

## 2021-11-05 ENCOUNTER — Other Ambulatory Visit: Payer: Self-pay

## 2021-11-05 ENCOUNTER — Ambulatory Visit
Admission: RE | Admit: 2021-11-05 | Discharge: 2021-11-05 | Disposition: A | Discharge status: Home / Self Care | Payer: Medicare Other | Source: Ambulatory Visit | Attending: Radiation Oncology | Admitting: Radiation Oncology

## 2021-11-05 DIAGNOSIS — Z510 Encounter for antineoplastic radiation therapy: Secondary | ICD-10-CM | POA: Diagnosis not present

## 2021-11-05 LAB — RAD ONC ARIA SESSION SUMMARY
Course Elapsed Days: 48
Plan Fractions Treated to Date: 9
Plan Prescribed Dose Per Fraction: 2 Gy
Plan Total Fractions Prescribed: 15
Plan Total Prescribed Dose: 30 Gy
Reference Point Dosage Given to Date: 63 Gy
Reference Point Session Dosage Given: 2 Gy
Session Number: 34

## 2021-11-05 NOTE — Telephone Encounter (Signed)
Called to inform patient of approval for patient assistance farxiga and will notify when it is ready for pickup. No answer left vm to callback. ?

## 2021-11-06 ENCOUNTER — Other Ambulatory Visit: Payer: Self-pay

## 2021-11-06 ENCOUNTER — Ambulatory Visit
Admission: RE | Admit: 2021-11-06 | Discharge: 2021-11-06 | Disposition: A | Discharge status: Home / Self Care | Payer: Medicare Other | Source: Ambulatory Visit | Attending: Radiation Oncology | Admitting: Radiation Oncology

## 2021-11-06 DIAGNOSIS — Z510 Encounter for antineoplastic radiation therapy: Secondary | ICD-10-CM | POA: Diagnosis not present

## 2021-11-06 LAB — RAD ONC ARIA SESSION SUMMARY
Course Elapsed Days: 49
Plan Fractions Treated to Date: 10
Plan Prescribed Dose Per Fraction: 2 Gy
Plan Total Fractions Prescribed: 15
Plan Total Prescribed Dose: 30 Gy
Reference Point Dosage Given to Date: 65 Gy
Reference Point Session Dosage Given: 2 Gy
Session Number: 35

## 2021-11-07 ENCOUNTER — Other Ambulatory Visit: Payer: Self-pay

## 2021-11-07 ENCOUNTER — Ambulatory Visit
Admission: RE | Admit: 2021-11-07 | Discharge: 2021-11-07 | Disposition: A | Discharge status: Home / Self Care | Payer: Medicare Other | Source: Ambulatory Visit | Attending: Radiation Oncology | Admitting: Radiation Oncology

## 2021-11-07 DIAGNOSIS — Z510 Encounter for antineoplastic radiation therapy: Secondary | ICD-10-CM | POA: Diagnosis not present

## 2021-11-07 LAB — RAD ONC ARIA SESSION SUMMARY
Course Elapsed Days: 50
Plan Fractions Treated to Date: 11
Plan Prescribed Dose Per Fraction: 2 Gy
Plan Total Fractions Prescribed: 15
Plan Total Prescribed Dose: 30 Gy
Reference Point Dosage Given to Date: 67 Gy
Reference Point Session Dosage Given: 2 Gy
Session Number: 36

## 2021-11-09 IMAGING — XA IR FLUORO GUIDE NDL PLMT / BX
1 series · 2 of 2 positions shown · non-contrast
Comparison: none

INDICATION: 63-year-old male with a history of possible multiple myeloma

[Series 300: ir bone marrow biopsy & aspiration · 2 of 2 slices shown]
[im 1/2]
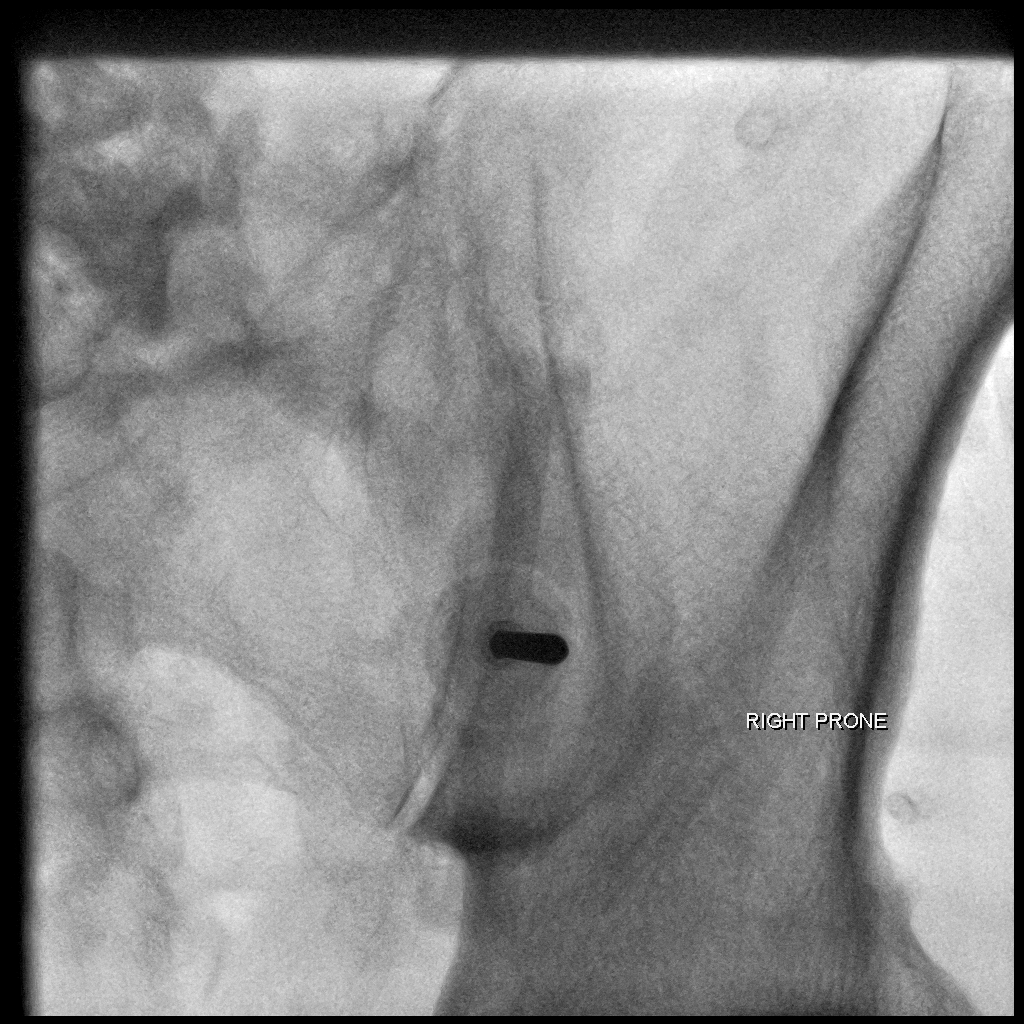
[im 2/2]
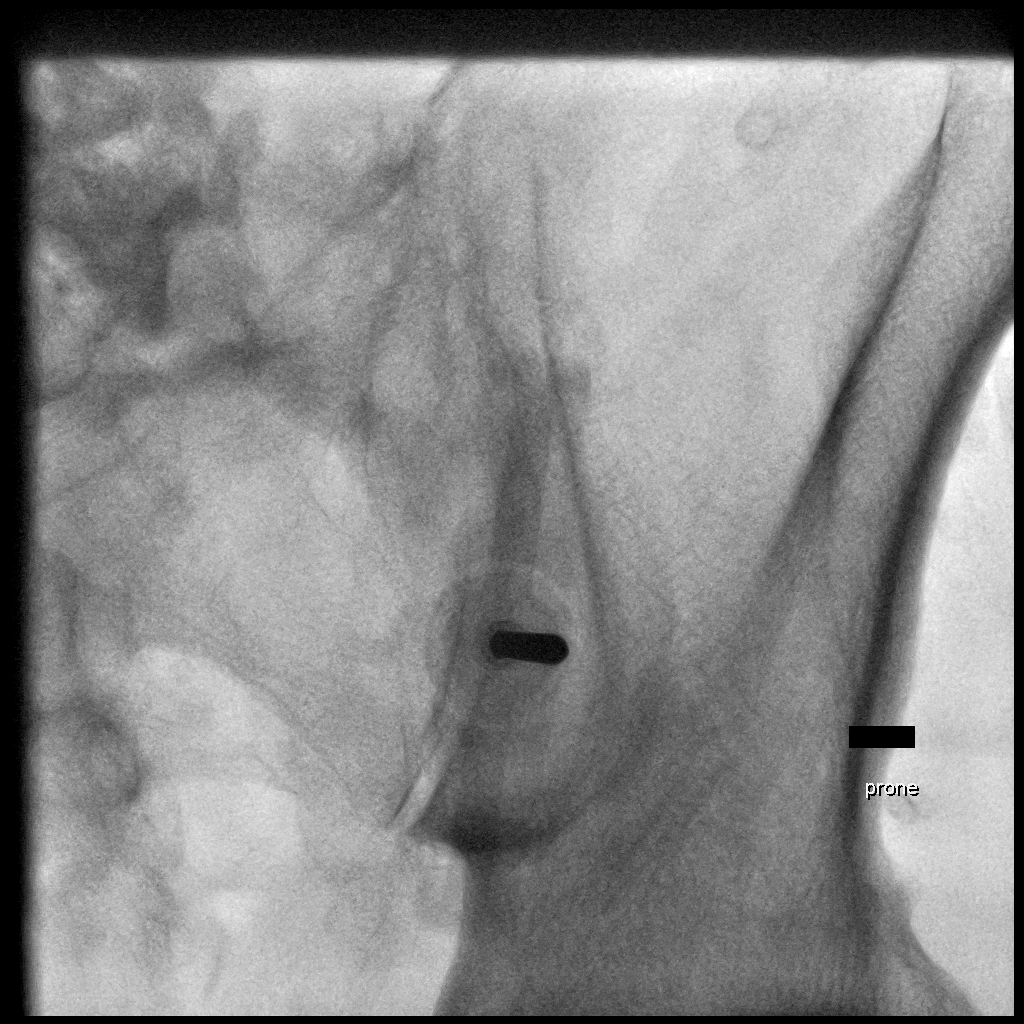

[2 of 2 positions shown; findings below may reference images not displayed]

EXAM:
IR FLUORO GUIDE NEEDLE PLACEMENT /BIOPSY

MEDICATIONS:
None.

ANESTHESIA/SEDATION:
Moderate (conscious) sedation was employed during this procedure. A
total of Versed 2.0 mg and Fentanyl 100 mcg was administered
intravenously.

Moderate Sedation Time: 16 minutes. The patient's level of
consciousness and vital signs were monitored continuously by
radiology nursing throughout the procedure under my direct
supervision.

FLUOROSCOPY TIME:  Fluoroscopy Time: 2 minutes 24 seconds (57 mGy).

COMPLICATIONS:
Min none

PROCEDURE:
Informed written consent was obtained from the patient after a
thorough discussion of the procedural risks, benefits and
alternatives. All questions were addressed. Maximal Sterile Barrier
Technique was utilized including caps, mask, sterile gowns, sterile
gloves, sterile drape, hand hygiene and skin antiseptic. A timeout
was performed prior to the initiation of the procedure.

Scout x-ray of the pelvis was performed for surgical planning
purposes.

The right posterior pelvis was prepped with Chlorhexidine in a
sterile fashion, and a sterile drape was applied covering the
operative field. A sterile gown and sterile gloves were used for the
procedure. Local anesthesia was provided with 1% Lidocaine.

Right posterior iliac bone was targeted for biopsy. The skin and
subcutaneous tissues were infiltrated with 1% lidocaine without
epinephrine. A small stab incision was made with an 11 blade
scalpel, and an 11 gauge Iles needle was advanced with fluoro
guidance to the posterior cortex. Manual forced was used to advance
the needle through the posterior cortex and the stylet was removed.
A bone marrow aspirate was retrieved and passed to a
cytotechnologist in the room. The Iles needle was then advanced
without the stylet for a core biopsy. The core biopsy was retrieved
and also passed to a cytotechnologist.

Manual pressure was used for hemostasis and a sterile dressing was
placed.

No complications were encountered no significant blood loss was
encountered.

Patient tolerated the procedure well and remained hemodynamically
stable throughout.
IMPRESSION: Status post image guided bone marrow biopsy of the right posterior
iliac bone.

## 2021-11-10 ENCOUNTER — Other Ambulatory Visit: Payer: Self-pay

## 2021-11-10 ENCOUNTER — Ambulatory Visit
Admission: RE | Admit: 2021-11-10 | Discharge: 2021-11-10 | Disposition: A | Discharge status: Home / Self Care | Payer: Medicare Other | Source: Ambulatory Visit | Attending: Radiation Oncology | Admitting: Radiation Oncology

## 2021-11-10 DIAGNOSIS — Z510 Encounter for antineoplastic radiation therapy: Secondary | ICD-10-CM | POA: Diagnosis not present

## 2021-11-10 DIAGNOSIS — C61 Malignant neoplasm of prostate: Secondary | ICD-10-CM | POA: Insufficient documentation

## 2021-11-10 LAB — RAD ONC ARIA SESSION SUMMARY
Course Elapsed Days: 53
Plan Fractions Treated to Date: 12
Plan Prescribed Dose Per Fraction: 2 Gy
Plan Total Fractions Prescribed: 15
Plan Total Prescribed Dose: 30 Gy
Reference Point Dosage Given to Date: 69 Gy
Reference Point Session Dosage Given: 2 Gy
Session Number: 37

## 2021-11-11 ENCOUNTER — Ambulatory Visit
Admission: RE | Admit: 2021-11-11 | Discharge: 2021-11-11 | Disposition: A | Discharge status: Home / Self Care | Payer: Medicare Other | Source: Ambulatory Visit | Attending: Radiation Oncology | Admitting: Radiation Oncology

## 2021-11-11 ENCOUNTER — Other Ambulatory Visit: Payer: Self-pay

## 2021-11-11 DIAGNOSIS — Z510 Encounter for antineoplastic radiation therapy: Secondary | ICD-10-CM | POA: Diagnosis not present

## 2021-11-11 LAB — RAD ONC ARIA SESSION SUMMARY
Course Elapsed Days: 54
Plan Fractions Treated to Date: 13
Plan Prescribed Dose Per Fraction: 2 Gy
Plan Total Fractions Prescribed: 15
Plan Total Prescribed Dose: 30 Gy
Reference Point Dosage Given to Date: 71 Gy
Reference Point Session Dosage Given: 2 Gy
Session Number: 38

## 2021-11-12 ENCOUNTER — Other Ambulatory Visit: Payer: Self-pay

## 2021-11-12 ENCOUNTER — Ambulatory Visit
Admission: RE | Admit: 2021-11-12 | Discharge: 2021-11-12 | Disposition: A | Discharge status: Home / Self Care | Payer: Medicare Other | Source: Ambulatory Visit | Attending: Radiation Oncology | Admitting: Radiation Oncology

## 2021-11-12 ENCOUNTER — Ambulatory Visit: Payer: Medicare Other

## 2021-11-12 DIAGNOSIS — Z510 Encounter for antineoplastic radiation therapy: Secondary | ICD-10-CM | POA: Diagnosis not present

## 2021-11-12 LAB — RAD ONC ARIA SESSION SUMMARY
Course Elapsed Days: 55
Plan Fractions Treated to Date: 14
Plan Prescribed Dose Per Fraction: 2 Gy
Plan Total Fractions Prescribed: 15
Plan Total Prescribed Dose: 30 Gy
Reference Point Dosage Given to Date: 73 Gy
Reference Point Session Dosage Given: 2 Gy
Session Number: 39

## 2021-11-13 ENCOUNTER — Other Ambulatory Visit: Payer: Self-pay

## 2021-11-13 ENCOUNTER — Encounter: Payer: Self-pay | Admitting: Urology

## 2021-11-13 ENCOUNTER — Ambulatory Visit
Admission: RE | Admit: 2021-11-13 | Discharge: 2021-11-13 | Disposition: A | Discharge status: Home / Self Care | Payer: Medicare Other | Source: Ambulatory Visit | Attending: Radiation Oncology | Admitting: Radiation Oncology

## 2021-11-13 DIAGNOSIS — Z510 Encounter for antineoplastic radiation therapy: Secondary | ICD-10-CM | POA: Diagnosis not present

## 2021-11-13 DIAGNOSIS — C61 Malignant neoplasm of prostate: Secondary | ICD-10-CM

## 2021-11-13 LAB — RAD ONC ARIA SESSION SUMMARY
Course Elapsed Days: 56
Plan Fractions Treated to Date: 15
Plan Prescribed Dose Per Fraction: 2 Gy
Plan Total Fractions Prescribed: 15
Plan Total Prescribed Dose: 30 Gy
Reference Point Dosage Given to Date: 75 Gy
Reference Point Session Dosage Given: 2 Gy
Session Number: 40

## 2021-11-14 LAB — TESTOSTERONE

## 2021-11-14 LAB — PSA

## 2021-11-18 ENCOUNTER — Other Ambulatory Visit: Payer: Self-pay | Admitting: Endocrinology

## 2021-11-21 ENCOUNTER — Other Ambulatory Visit: Payer: Self-pay

## 2021-11-21 DIAGNOSIS — E1165 Type 2 diabetes mellitus with hyperglycemia: Secondary | ICD-10-CM

## 2021-11-21 MED ORDER — ACCU-CHEK GUIDE VI STRP: ORAL_STRIP | 2 refills | Status: DC

## 2021-11-24 ENCOUNTER — Encounter: Payer: Self-pay | Admitting: Internal Medicine

## 2021-11-25 ENCOUNTER — Other Ambulatory Visit: Payer: Self-pay | Admitting: Cardiology

## 2021-11-26 ENCOUNTER — Other Ambulatory Visit: Payer: Self-pay

## 2021-11-26 DIAGNOSIS — E1165 Type 2 diabetes mellitus with hyperglycemia: Secondary | ICD-10-CM

## 2021-11-26 MED ORDER — ACCU-CHEK GUIDE VI STRP: ORAL_STRIP | 2 refills | Status: DC

## 2021-11-26 MED ORDER — ACCU-CHEK SOFTCLIX LANCETS MISC: 2 refills | Status: DC

## 2021-12-03 ENCOUNTER — Other Ambulatory Visit: Payer: Self-pay

## 2021-12-03 DIAGNOSIS — E1165 Type 2 diabetes mellitus with hyperglycemia: Secondary | ICD-10-CM

## 2021-12-03 MED ORDER — ACCU-CHEK GUIDE VI STRP: ORAL_STRIP | 2 refills | Status: DC

## 2021-12-16 ENCOUNTER — Encounter: Payer: Self-pay | Admitting: Urology

## 2021-12-16 NOTE — Progress Notes (Signed)
Telephone appointment. I verified patient's identity and began nursing interview. Patient is doing well. No issues reported at this time.  Meaningful use complete. I-PSS score of 5-mild. No urinary management medications. Urology appointment- July, 2023-per patient.  Reminded patient of his 8:30am-12/17/21 telephone appointment w/ Ashlyn Bruning PA-C. I let my extension [phone] in case patient needs anything. Patient verbalized understanding.  Patient contact [phone]

## 2021-12-16 NOTE — Progress Notes (Signed)
  Radiation Oncology         [phone]) [phone] ________________________________  Name: [name] MRN: [id number]  Date: 11/13/2021  DOB: [birth year]  End of Treatment Note  Diagnosis:   66 y.o. 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 The prostate, seminal vesicles, and pelvic lymph nodes were initially treated to 45 Gy in 25 fractions of 1.8 Gy  2. The prostate only was boosted to 75 Gy with 15 additional fractions of 2.0 Gy   Beams/energy:  1. The prostate, seminal vesicles, and pelvic lymph nodes were initially treated using VMAT intensity modulated radiotherapy delivering 6 megavolt photons. Image guidance was performed with CB-CT studies prior to each fraction. He was immobilized with a body fix lower extremity mold.  2. the prostate only was boosted using VMAT intensity modulated radiotherapy delivering 6 megavolt photons. Image guidance was performed with CB-CT studies prior to each fraction. He was immobilized with a body fix lower extremity mold.  Narrative: The patient tolerated radiation treatment relatively well.   The patient experienced some minor urinary irritation with nocturia x2 and modest fatigue.   Plan: The patient has completed radiation treatment. He will return to radiation oncology clinic for routine followup in one month. I advised him to call or return sooner if he has any questions or concerns related to his recovery or treatment. ________________________________  Sheral Apley. Tammi Klippel, M.D.

## 2021-12-17 ENCOUNTER — Ambulatory Visit
Admission: RE | Admit: 2021-12-17 | Discharge: 2021-12-17 | Disposition: A | Discharge status: Home / Self Care | Payer: Medicare Other | Source: Ambulatory Visit | Attending: Radiation Oncology | Admitting: Radiation Oncology

## 2021-12-17 DIAGNOSIS — C61 Malignant neoplasm of prostate: Secondary | ICD-10-CM | POA: Insufficient documentation

## 2021-12-17 NOTE — Progress Notes (Signed)
Radiation Oncology         [phone] ________________________________  Name: [name] MRN: [id number]  Date: 12/17/2021  DOB: [birth year]  Post Treatment Note  CC: Isaac Bliss, Rayford Halsted, MD  Lucas Mallow, MD  Diagnosis:   66 y.o. 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 The prostate, seminal vesicles, and pelvic lymph nodes were initially treated to 45 Gy in 25 fractions of 1.8 Gy  2. The prostate only was boosted to 75 Gy with 15 additional fractions of 2.0 Gy   Narrative:  I spoke with the patient to conduct his routine scheduled 1 month follow up visit via telephone to spare the patient unnecessary potential exposure in the healthcare setting during the current COVID-19 pandemic.  The patient was notified in advance and gave permission to proceed with this visit format.  He tolerated radiation treatment relatively well.   The patient experienced some minor urinary irritation with nocturia x2 and modest fatigue.                               On review of systems, the patient states that he is doing very well and is currently without complaints.  He feels like he tolerated the radiation treatments extremely well and really had no significant side effects aside from mild increased nocturia.  He specifically denies dysuria, gross hematuria, straining to void, excessive daytime frequency, urgency, incomplete bladder emptying or incontinence.  He reports a healthy appetite and is maintaining his weight.  He denies abdominal pain, nausea, vomiting, diarrhea or constipation.  He has also continued to tolerate the ADT well, despite occasional hot flashes and some decreased stamina.  Overall, he is quite pleased with his progress to date.  ALLERGIES:  is allergic to metformin and related.  Meds: Current  Outpatient Medications  Medication Sig Dispense Refill   Accu-Chek Softclix Lancets lancets Check blood sugar twice a day 100 each 2   amLODipine (NORVASC) 5 MG tablet Take 1 tablet (5 mg total) by mouth daily. (Patient taking differently: Take 5 mg by mouth daily.) 180 tablet 3   aspirin EC 81 MG tablet Take 81 mg by mouth daily. Swallow whole.     atorvastatin (LIPITOR) 80 MG tablet Take 1 tablet by mouth once daily 90 tablet 0   Blood Glucose Monitoring Suppl (ACCU-CHEK GUIDE ME) w/Device KIT 1 each by Does not apply route.     carvedilol (COREG) 25 MG tablet Take 1 tablet (25 mg total) by mouth 2 (two) times daily with a meal. 180 tablet 3   glucose blood (ACCU-CHEK GUIDE) test strip Check blood sugar once a day 100 each 2   hydrALAZINE (APRESOLINE) 50 MG tablet TAKE 1 TABLET BY MOUTH THREE TIMES DAILY (Patient taking differently: Take 50 mg by mouth 3 (three) times daily.) 270 tablet 3   hydroxypropyl methylcellulose / hypromellose (ISOPTO TEARS / GONIOVISC) 2.5 % ophthalmic solution Place 2 drops into the left eye as needed for dry eyes. 15 mL 2   isosorbide mononitrate (IMDUR) 30 MG 24 hr tablet Take 1 tablet by mouth once daily 90 tablet 2   potassium chloride SA (KLOR-CON M) 20 MEQ tablet Take 1 tablet by mouth once daily 30 tablet 2   repaglinide (  PRANDIN) 2 MG tablet Take 1 tablet (2 mg total) by mouth 3 (three) times daily before meals. 180 tablet 0   No current facility-administered medications for this encounter.   Facility-Administered Medications Ordered in Other Encounters  Medication Dose Route Frequency Provider Last Rate Last Admin   sodium chloride flush (NS) 0.9 % injection 10 mL  10 mL Intravenous PRN Brunetta Genera, MD       sodium chloride flush (NS) 0.9 % injection 10 mL  10 mL Intracatheter Once PRN Brunetta Genera, MD        Physical Findings:  vitals were not taken for this visit.  Pain Assessment Pain Score: 0-No pain/10 Unable to assess due to  telephone follow-up visit format.  Lab Findings: Lab Results  Component Value Date   WBC 9.9 05/07/2021   HGB 12.9 (L) 09/05/2021   HCT 38.0 (L) 09/05/2021   MCV 85.0 05/07/2021   PLT 203 05/07/2021     Radiographic Findings: No results found.  Impression/Plan: 1. 66 y.o. 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 He will continue to follow up with urology for ongoing PSA determinations and has an appointment scheduled with Dr. Gloriann Loan in July 2023 when he will be due for his next ADT injection.  He is tolerating the ADT well and intends to complete the full 18 - 104-month course of ADT, at the discretion of Dr. Gloriann Loan.  He understands what to expect with regards to PSA monitoring going forward. I will look forward to following his response to treatment via correspondence with urology, and would be happy to continue to participate in his care if clinically indicated. I talked to the patient about what to expect in the future, including his risk for erectile dysfunction and rectal bleeding. I encouraged him to call or return to the office if he has any questions regarding his previous radiation or possible radiation side effects. He was comfortable with this plan and will follow up as needed.       Nicholos Johns, PA-C

## 2021-12-18 ENCOUNTER — Ambulatory Visit (INDEPENDENT_AMBULATORY_CARE_PROVIDER_SITE_OTHER): Payer: Medicare Other | Admitting: Physician Assistant

## 2021-12-18 ENCOUNTER — Encounter: Payer: Self-pay | Admitting: Physician Assistant

## 2021-12-18 VITALS — BP 134/70 | HR 61 | Ht 69.5 in | Wt 183.6 lb

## 2021-12-18 DIAGNOSIS — E119 Type 2 diabetes mellitus without complications: Secondary | ICD-10-CM

## 2021-12-18 DIAGNOSIS — I25810 Atherosclerosis of coronary artery bypass graft(s) without angina pectoris: Secondary | ICD-10-CM | POA: Diagnosis not present

## 2021-12-18 DIAGNOSIS — I10 Essential (primary) hypertension: Secondary | ICD-10-CM | POA: Diagnosis not present

## 2021-12-18 DIAGNOSIS — E785 Hyperlipidemia, unspecified: Secondary | ICD-10-CM | POA: Diagnosis not present

## 2021-12-18 DIAGNOSIS — C9001 Multiple myeloma in remission: Secondary | ICD-10-CM

## 2021-12-18 NOTE — Progress Notes (Unsigned)
Cardiology Office Note:    Date:  12/18/2021   ID:  Joseph, Welch 02-19-1956, MRN [id number]  PCP:  Isaac Bliss, Rayford Halsted, MD   [facility] Providers Cardiologist:  Peter Martinique, MD { Click to update primary MD,subspecialty MD or APP then REFRESH:1}    Referring MD: Isaac Bliss, [place]*   No chief complaint on file. ***  History of Present Illness:    [name] is a 66 y.o. male with a hx of CAD s/p CABG, HTN, HLD, DM 2, dilated cardiomyopathy and longstanding past noncompliance with follow-up and meds.  Cardiac catheterization in 2006 demonstrated coronary artery disease, however he was not a candidate for further revascularization.  Remote CTA of abdomen showed 50% left renal artery stenosis in 2006.  Follow-up renal artery ultrasound was okay.  Echocardiogram in 2009 showed significant improvement in LV function and EF was normal.  He was admitted in May 2019 with significant hypertension and CHF requiring aggressive diuresis.  Echocardiogram at the time showed EF 50 to 55%.  He has been followed by hematology service for multiple myeloma.  He was diagnosed with a small PE in October 2021 and was on a short course of Eliquis.  Patient was admitted in March 2022 with anorexia and 15 pound weight loss at the recommendation of oncology service.  Creatinine was elevated at 2.81.  CT of abdomen and pelvis revealed severe bilateral hydroureteronephrosis and irregularly thickened lobular urinary bladder with marked irregular mural thickening as well as non-mass-like extension from the bladder dome.  Urology was consulted who recommended continuing Foley catheter until follow-up as outpatient. He underwent cystoscopy and TURP in June 2022.  He was last seen by Dr. Martinique on 04/28/2021 at which point he was stable from the cardiac perspective. He has been diagnosed with prostate cancer.  PET scan obtained in December 2022 showed no evidence of metastatic disease.  He  underwent gold seed implant by urology service in February 2023 and has been receiving radiation therapy.  Patient presents today for follow-up.  He is overdue for fasting lipid panel, but this can be done at any time in the next few months.  Overall, he is doing well without any chest pain or worsening dyspnea.  He has no lower extremity edema, orthopnea or PND.  He can follow-up with cardiology service in 6 months.  Past Medical History:  Diagnosis Date   Anemia    Benign localized prostatic hyperplasia with lower urinary tract symptoms (LUTS)    CAD (coronary artery disease)    cardiologist--- dr Martinique;   11-27-2020 cardiac cath for abnormal cardiolite,  severe 3V disease;   11-30-2000 s/p CABG x4;  cath 04-21-2005 patent SVG-OM1 & SVG --PDA graft's,  occluded radial graft -- OM, mod to sev LV dysfunction,  not candidate for revascularization   Chronic diastolic CHF (congestive heart failure) ([place])    followed by cardiologist   Chronic kidney disease (CKD), stage IV (severe) [facility])    nephrologist--- dr Joelyn Oms   (lov note scanned in [place] , epic, dated 09-26-2020)   Dilated cardiomyopathy ([place])    echo in 2009 showed improvement with a normal EF   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 03-08-2021   (09-03-2021  per pt wife is resolving)   History of COVID-19    spring 2022 with mild symptoms that resolved   History of pulmonary embolism 03/13/2020   complete blood thinner   History of sepsis  hx admission for severe sepsis twice;  admitted 03-13-2020 secondary to aspiration pneumonia w/ ARF and PE;   admitted 10-07-2020 secondary to MSSA uti   History of stroke    per imaging 05-07-2021 head CT ,  remote infarcts left thalamus, basal ganglia   HTN (hypertension)    Hyperlipemia    Ischemic heart disease    cardiologist--- dr Martinique;   Malignant neoplasm prostate [facility]) 05/2021   urologist-- dr eskridge/  oncologist--- dr Alen Blew;  dx 11/ 2022,  Gleason 9,  PSA 14   Multiple myeloma in remission [facility]) 2021   oncologist--- dr Irene Limbo--- dx 2021, IgG Kappa type, completed chemo 2021, in remission since   OA (osteoarthritis)    PAD (peripheral artery disease) ([place])    RA (rheumatoid arthritis) [facility])    rheumatologist--- dr s. Estanislado Pandy---  multiple sites,  no other treatment other than takes tylenol arthritis with flare-ups   Tympanic membrane central perforation, left    followed by dr bates (ent)  chronic since 08/ 2022   (09-03-2021  per pt wife has partially closed)   Type 2 diabetes mellitus [facility])    endocrinologist--- dr a [name]   Uses walker    Wears glasses     Past Surgical History:  Procedure Laterality Date   CARDIAC CATHETERIZATION  11/27/2000   '@[facility]'$  by Dr Martinique;   severe 3V disease,  ef 15-25%   CARDIAC CATHETERIZATION  04/21/2005   by Dr Martinique;    patnet SVG -- OM1 & SVG --PDA grafts,  occluded radial graft --OM1,  atretic LIMA -- Diag,  moderate to severe LV dysfunction,  not candidate for revascularization   CATARACT EXTRACTION W/ INTRAOCULAR LENS IMPLANT Right 08/21/2021   CORONARY ARTERY BYPASS GRAFT  11/30/2000   '@[facility]'$  by Dr Nils Pyle;   LIMA -- Diag,  radial artery -- OM1,  SVG -- OM,  SVG -- RCA   GOLD SEED IMPLANT N/A 09/05/2021   Procedure: GOLD SEED IMPLANT;  Surgeon: Festus Aloe, MD;  Location: [facility];  Service: Urology;  Laterality: N/A;  45 MINS FOR CASE   IR FLUORO GUIDED NEEDLE PLC ASPIRATION/INJECTION LOC  10/24/2019   SPACE OAR INSTILLATION N/A 09/05/2021   Procedure: SPACE OAR INSTILLATION;  Surgeon: Festus Aloe, MD;  Location[facility];  Service: Urology;  Laterality: N/A;   TRANSURETHRAL RESECTION OF PROSTATE N/A 01/06/2021   Procedure: CYSTOSCOPY WITH CYSTOGRAM/TRANSURETHRAL RESECTION OF THE PROSTATE (TURP);  Surgeon: Lucas Mallow, MD;  Location: [facility];  Service: Urology;  Laterality: N/A;    Current Medications: No outpatient medications have been  marked as taking for the 12/18/21 encounter (Appointment) with Almyra Deforest, [place].     Allergies:   Metformin and related   Social History   Socioeconomic History   Marital status: Married    Spouse name: Not on file   Number of children: 1   Years of education: Not on file   Highest education level: 12th grade  Occupational History   Occupation: Furniture conservator/restorer  Tobacco Use   Smoking status: Never   Smokeless tobacco: Never  Vaping Use   Vaping Use: Never used  Substance and Sexual Activity   Alcohol use: Not Currently   Drug use: Never   Sexual activity: Yes  Other Topics Concern   Not on file  Social History Narrative   ** Merged History Encounter **       Social Determinants of Health   Financial Resource Strain: Low Risk  (  05/07/2021)   Overall Financial Resource Strain (CARDIA)    Difficulty of Paying Living Expenses: Not hard at all  Food Insecurity: No Food Insecurity (05/07/2021)   Hunger Vital Sign    Worried About Running Out of Food in the Last Year: Never true    Ran Out of Food in the Last Year: Never true  Transportation Needs: No Transportation Needs (05/07/2021)   PRAPARE - Hydrologist (Medical): No    Lack of Transportation (Non-Medical): No  Physical Activity: Unknown (05/07/2021)   Exercise Vital Sign    Days of Exercise per Week: 0 days    Minutes of Exercise per Session: Not on file  Stress: No Stress Concern Present (05/07/2021)   [place]    Feeling of Stress : Not at all  Social Connections: Moderately Isolated (05/07/2021)   Social Connection and Isolation Panel [NHANES]    Frequency of Communication with Friends and Family: Once a week    Frequency of Social Gatherings with Friends and Family: Once a week    Attends Religious Services: More than 4 times per year    Active Member of [employer] or Organizations: No    Attends Music therapist:  Not on file    Marital Status: Married     Family History: The patient's ***family history includes Diabetes in his mother; Healthy in his daughter; Hypertension in his father.  ROS:   Please see the history of present illness.    *** All other systems reviewed and are negative.  EKGs/Labs/Other Studies Reviewed:    The following studies were reviewed today: ***  EKG:  EKG is *** ordered today.  The ekg ordered today demonstrates ***  Recent Labs: 05/07/2021: ALT 13; Platelets 203 09/05/2021: Hemoglobin 12.9 10/27/2021: BUN 18; Creatinine, Ser 1.31; Potassium 3.5; Sodium 141  Recent Lipid Panel    Component Value Date/Time   CHOL 92 (L) 09/04/2019 1631   TRIG 89 09/04/2019 1631   HDL 35 (L) 09/04/2019 1631   CHOLHDL 2.6 09/04/2019 1631   CHOLHDL 2 07/21/2018 1507   VLDL 13.0 07/21/2018 1507   LDLCALC 39 09/04/2019 1631   LDLDIRECT 80.0 07/22/2021 0928     Risk Assessment/Calculations:   {Does this patient have ATRIAL FIBRILLATION?[phone]}       Physical Exam:    VS:  There were no vitals taken for this visit.    Wt Readings from Last 3 Encounters:  10/30/21 180 lb 12.8 oz (82 kg)  09/05/21 180 lb 14.4 oz (82.1 kg)  07/29/21 178 lb 6.4 oz (80.9 kg)     GEN: *** Well nourished, well developed in no acute distress HEENT: Normal NECK: No JVD; No carotid bruits LYMPHATICS: No lymphadenopathy CARDIAC: ***RRR, no murmurs, rubs, gallops RESPIRATORY:  Clear to auscultation without rales, wheezing or rhonchi  ABDOMEN: Soft, non-tender, non-distended MUSCULOSKELETAL:  No edema; No deformity  SKIN: Warm and dry NEUROLOGIC:  Alert and oriented x 3 PSYCHIATRIC:  Normal affect   ASSESSMENT:    No diagnosis found. PLAN:    In order of problems listed above:  ***      {Are you ordering a CV Procedure (e.g. stress test, cath, DCCV, TEE, etc)?   Press F2        :[id number]}    Medication Adjustments/Labs and Tests Ordered: Current medicines are reviewed at  length with the patient today.  Concerns regarding medicines are outlined above.  No orders of  the defined types were placed in this encounter.  No orders of the defined types were placed in this encounter.   There are no Patient Instructions on file for this visit.   Hilbert Corrigan, [place]  12/18/2021 1:48 PM    [facility]

## 2021-12-18 NOTE — Patient Instructions (Addendum)
Medication Instructions:  Your physician recommends that you continue on your current medications as directed. Please refer to the Current Medication list given to you today.  *If you need a refill on your cardiac medications before your next appointment, please call your pharmacy*  Lab Work: Your physician recommends that you return for lab work at your earliest convenience:  Fasting Lipid Panel-DO NOT eat or drink past midnight. Okay to have water.   If you have labs (blood work) drawn today and your tests are completely normal, you will receive your results only by: [place] (if you have MyChart) OR A paper copy in the mail If you have any lab test that is abnormal or we need to change your treatment, we will call you to review the results.  Testing/Procedures: NONE ordered at this time of appointment   Follow-Up: At [facility], you and your health needs are our priority.  As part of our continuing mission to provide you with exceptional heart care, we have created designated Provider Care Teams.  These Care Teams include your primary Cardiologist (physician) and Advanced Practice Providers (APPs -  Physician Assistants and Nurse Practitioners) who all work together to provide you with the care you need, when you need it.  Your next appointment:   6 month(s)  The format for your next appointment:   In Person  Provider:   Peter Martinique, MD     Other Instructions   Important Information About Sugar

## 2021-12-22 ENCOUNTER — Other Ambulatory Visit: Payer: Self-pay

## 2021-12-22 DIAGNOSIS — E1165 Type 2 diabetes mellitus with hyperglycemia: Secondary | ICD-10-CM

## 2021-12-22 MED ORDER — ACCU-CHEK SOFTCLIX LANCETS MISC: 2 refills | Status: DC

## 2022-01-15 ENCOUNTER — Encounter: Payer: Self-pay | Admitting: *Deleted

## 2022-01-20 ENCOUNTER — Other Ambulatory Visit: Payer: Self-pay | Admitting: Cardiology

## 2022-01-23 ENCOUNTER — Other Ambulatory Visit (INDEPENDENT_AMBULATORY_CARE_PROVIDER_SITE_OTHER): Payer: Medicare Other

## 2022-01-23 DIAGNOSIS — E1165 Type 2 diabetes mellitus with hyperglycemia: Secondary | ICD-10-CM

## 2022-01-23 LAB — BASIC METABOLIC PANEL
BUN: 29 mg/dL — ABNORMAL HIGH (ref 6–23)
CO2: 25 mEq/L (ref 19–32)
Calcium: 10 mg/dL (ref 8.4–10.5)
Chloride: 108 mEq/L (ref 96–112)
Creatinine, Ser: 1.43 mg/dL (ref 0.40–1.50)
GFR: 51.04 mL/min — ABNORMAL LOW (ref >60.00)
Glucose, Bld: 147 mg/dL — ABNORMAL HIGH (ref 70–99)
Potassium: 4.1 mEq/L (ref 3.5–5.1)
Sodium: 141 mEq/L (ref 135–145)

## 2022-01-23 LAB — HEMOGLOBIN A1C: Hgb A1c MFr Bld: 6.6 % — ABNORMAL HIGH (ref 4.6–6.5)

## 2022-01-28 ENCOUNTER — Encounter: Payer: Self-pay | Admitting: Endocrinology

## 2022-01-28 ENCOUNTER — Ambulatory Visit (INDEPENDENT_AMBULATORY_CARE_PROVIDER_SITE_OTHER): Payer: Medicare Other | Admitting: Endocrinology

## 2022-01-28 VITALS — BP 146/74 | HR 66 | Ht 69.5 in | Wt 186.6 lb

## 2022-01-28 DIAGNOSIS — E1165 Type 2 diabetes mellitus with hyperglycemia: Secondary | ICD-10-CM

## 2022-01-28 DIAGNOSIS — I25810 Atherosclerosis of coronary artery bypass graft(s) without angina pectoris: Secondary | ICD-10-CM | POA: Diagnosis not present

## 2022-01-28 DIAGNOSIS — E785 Hyperlipidemia, unspecified: Secondary | ICD-10-CM

## 2022-01-28 DIAGNOSIS — N1831 Chronic kidney disease, stage 3a: Secondary | ICD-10-CM

## 2022-01-28 NOTE — Patient Instructions (Addendum)
Check blood sugars on waking up 2-3 days a week  Also check blood sugars about 2 hours after meals and do this after different meals by rotation  Recommended blood sugar levels on waking up are 90-130 and about 2 hours after meal is 130-180  Please bring your blood sugar monitor to each visit, thank you

## 2022-01-28 NOTE — Progress Notes (Signed)
Patient ID: [name], male   DOB: 10/13/1955, 67 y.o.   MRN: [id number]           Reason for Appointment: Follow-up for Type 2 Diabetes   History of Present Illness:          Date of diagnosis of type 2 diabetes mellitus:   2018      Background history:  He has never been told to have diabetes but review of his previous labs indicate that he had high blood sugar 179 in 2014 His highest sugar has been 234 in 10/2016 when his A1c was 8.8, not clear if he was symptomatic at that time  Recent history:   A1c is improved at 6.6 compared to 7.7    Non-insulin hypoglycemic drugs the patient is taking are:   Prandin 2 mg at meals  Current management, blood sugar patterns and problems identified:  He did start checking his blood sugars more regularly since his last visit and did bring his monitor for download He has checked his blood sugars however only in the mornings and only occasionally before dinner Previously had been explained the need to check readings after meals since he tends to have high readings after meals if any  Also he is supposed to start [place] but he says he has not heard back from the patient assistance program that we helped him with  Currently he is not able to do much physical exercise Weight is going up slowly Infrequently again and only has 2 recent readings only in the morning  He thinks he is taking his Prandin regularly before meals No hypoglycemia reported and fasting readings are only occasionally above target Not able to do much walking for exercise           Glucose monitoring:   has Accu-Chek guide meter   PRE-MEAL Fasting Lunch Dinner Bedtime Overall  Glucose range: 92-178  88, 112    Mean/median:     121   POST-MEAL PC Breakfast PC Lunch PC Dinner  Glucose range:   ?  Mean/median:      Previously: Blood sugars from review of monitor shows blood sugars 79 and 112 fasting   Dietician visit, most recent: 2/21  Weight history:  Wt  Readings from Last 3 Encounters:  01/28/22 186 lb 9.6 oz (84.6 kg)  12/18/21 183 lb 9.6 oz (83.3 kg)  10/30/21 180 lb 12.8 oz (82 kg)    Glycemic control:   Lab Results  Component Value Date   HGBA1C 6.6 (H) 01/23/2022   HGBA1C 7.7 (H) 10/27/2021   HGBA1C 8.0 (H) 07/22/2021   Lab Results  Component Value Date   MICROALBUR 12.6 (H) 07/21/2018   LDLCALC 39 09/04/2019   CREATININE 1.43 01/23/2022   Lab Results  Component Value Date   MICRALBCREAT 10.0 07/21/2018    Lab Results  Component Value Date   FRUCTOSAMINE 297 (H) 07/22/2021   FRUCTOSAMINE 347 (H) 04/14/2021   FRUCTOSAMINE 237 11/08/2020   Lab Results  Component Value Date   HGB 12.9 (L) 09/05/2021    Lab on 01/23/2022  Component Date Value Ref Range Status   Sodium 01/23/2022 141  135 - 145 mEq/L Final   Potassium 01/23/2022 4.1  3.5 - 5.1 mEq/L Final   Chloride 01/23/2022 108  96 - 112 mEq/L Final   CO2 01/23/2022 25  19 - 32 mEq/L Final   Glucose, Bld 01/23/2022 147 (H)  70 - 99 mg/dL Final   BUN 01/23/2022 29 (  H)  6 - 23 mg/dL Final   Creatinine, Ser 01/23/2022 1.43  0.40 - 1.50 mg/dL Final   GFR 01/23/2022 51.04 (L)  >60.00 mL/min Final   Calculated using the CKD-EPI Creatinine Equation (2021)   Calcium 01/23/2022 10.0  8.4 - 10.5 mg/dL Final   Hgb A1c MFr Bld 01/23/2022 6.6 (H)  4.6 - 6.5 % Final   Glycemic Control Guidelines for People with Diabetes:Non Diabetic:  <6%Goal of Therapy: <7%Additional Action Suggested:  >8%     Allergies as of 01/28/2022       Reactions   Metformin And Related Diarrhea        Medication List        Accurate as of January 28, 2022 10:15 AM. If you have any questions, ask your nurse or doctor.          Accu-Chek Guide Me w/Device Kit 1 each by Does not apply route.   Accu-Chek Guide test strip Generic drug: glucose blood Check blood sugar once a day   Accu-Chek Softclix Lancets lancets Check blood sugar once a day   amLODipine 5 MG tablet Commonly  known as: NORVASC Take 1 tablet by mouth once daily   aspirin EC 81 MG tablet Take 81 mg by mouth daily. Swallow whole.   atorvastatin 80 MG tablet Commonly known as: LIPITOR Take 1 tablet by mouth once daily   carvedilol 25 MG tablet Commonly known as: COREG Take 1 tablet (25 mg total) by mouth 2 (two) times daily with a meal.   hydrALAZINE 50 MG tablet Commonly known as: APRESOLINE TAKE 1 TABLET BY MOUTH THREE TIMES DAILY   hydroxypropyl methylcellulose / hypromellose 2.5 % ophthalmic solution Commonly known as: ISOPTO TEARS / GONIOVISC Place 2 drops into the left eye as needed for dry eyes.   isosorbide mononitrate 30 MG 24 hr tablet Commonly known as: IMDUR Take 1 tablet by mouth once daily   potassium chloride SA 20 MEQ tablet Commonly known as: KLOR-CON M Take 1 tablet by mouth once daily   repaglinide 2 MG tablet Commonly known as: PRANDIN Take 1 tablet (2 mg total) by mouth 3 (three) times daily before meals.        Allergies:  Allergies  Allergen Reactions   Metformin And Related Diarrhea    Past Medical History:  Diagnosis Date   Anemia    Benign localized prostatic hyperplasia with lower urinary tract symptoms (LUTS)    CAD (coronary artery disease)    cardiologist--- dr Martinique;   11-27-2020 cardiac cath for abnormal cardiolite,  severe 3V disease;   11-30-2000 s/p CABG x4;  cath 04-21-2005 patent SVG-OM1 & SVG --PDA graft's,  occluded radial graft -- OM, mod to sev LV dysfunction,  not candidate for revascularization   Chronic diastolic CHF (congestive heart failure) ([place])    followed by cardiologist   Chronic kidney disease (CKD), stage IV (severe) [facility])    nephrologist--- dr Joelyn Oms   (lov note scanned in [place] , epic, dated 09-26-2020)   Dilated cardiomyopathy ([place])    echo in 2009 showed improvement with a normal EF   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 03-08-2021   (09-03-2021  per pt wife is resolving)   History  of COVID-19    spring 2022 with mild symptoms that resolved   History of pulmonary embolism 03/13/2020   complete blood thinner   History of sepsis    hx admission for severe sepsis twice;  admitted 03-13-2020 secondary to aspiration pneumonia w/ ARF and PE;   admitted 10-07-2020 secondary to MSSA uti   History of stroke    per imaging 05-07-2021 head CT ,  remote infarcts left thalamus, basal ganglia   HTN (hypertension)    Hyperlipemia    Ischemic heart disease    cardiologist--- dr jordan;   Malignant neoplasm prostate ([facility]) 05/2021   urologist-- dr eskridge/  oncologist--- dr shadad;  dx 11/ 2022,  Gleason 9, PSA 14   Multiple myeloma in remission ([facility]) 2021   oncologist--- dr kale--- dx 2021, IgG Kappa type, completed chemo 2021, in remission since   OA (osteoarthritis)    PAD (peripheral artery disease) ([facility])    RA (rheumatoid arthritis) ([facility])    rheumatologist--- dr s. deveshwar---  multiple sites,  no other treatment other than takes tylenol arthritis with flare-ups   Tympanic membrane central perforation, left    followed by dr bates (ent)  chronic since 08/ 2022   (09-03-2021  per pt wife has partially closed)   Type 2 diabetes mellitus ([facility])    endocrinologist--- dr a kumar   Uses walker    Wears glasses     Past Surgical History:  Procedure Laterality Date   CARDIAC CATHETERIZATION  11/27/2000   @[facility] by Dr jordan;   severe 3V disease,  ef 15-25%   CARDIAC CATHETERIZATION  04/21/2005   by Dr Jordan;    patnet SVG -- OM1 & SVG --PDA grafts,  occluded radial graft --OM1,  atretic LIMA -- Diag,  moderate to severe LV dysfunction,  not candidate for revascularization   CATARACT EXTRACTION W/ INTRAOCULAR LENS IMPLANT Right 08/21/2021   CORONARY ARTERY BYPASS GRAFT  11/30/2000   @[facility] by Dr Van Tright;   LIMA -- Diag,  radial artery -- OM1,  SVG -- OM,  SVG -- RCA   GOLD SEED IMPLANT N/A 09/05/2021   Procedure: GOLD SEED IMPLANT;  Surgeon: Eskridge, Matthew, MD;  Location:  [facility];  Service: Urology;  Laterality: N/A;  45 MINS FOR CASE   IR FLUORO GUIDED NEEDLE PLC ASPIRATION/INJECTION LOC  10/24/2019   SPACE OAR INSTILLATION N/A 09/05/2021   Procedure: SPACE OAR INSTILLATION;  Surgeon: Eskridge, Matthew, MD;  Location: [facility];  Service: Urology;  Laterality: N/A;   TRANSURETHRAL RESECTION OF PROSTATE N/A 01/06/2021   Procedure: CYSTOSCOPY WITH CYSTOGRAM/TRANSURETHRAL RESECTION OF THE PROSTATE (TURP);  Surgeon: Bell, Eugene D III, MD;  Location[facility];  Service: Urology;  Laterality: N/A;    Family History  Problem Relation Age of Onset   Hypertension Father    Diabetes Mother        DIABETIC COMA   Healthy Daughter     Social History:  reports that he has never smoked. He has never used smokeless tobacco. He reports that he does not currently use alcohol. He reports that he does not use drugs.   Review of Systems   Lipid history: On atorvastatin 80 mg from his cardiologist since diagnosis of CAD, labs as follows    Lab Results  Component Value Date   CHOL 92 (L) 09/04/2019   HDL 35 (L) 09/04/2019   LDLCALC 39 09/04/2019   LDLDIRECT 80.0 07/22/2021   TRIG 89 09/04/2019   CHOLHDL 2.6 09/04/2019           Hypertension: Has been followed by cardiologist for blood pressure management Blood pressure is mostly high  Aldosterone level has been checked for his history of hypokalemia   and is normal  Recent readings:  BP Readings from Last 3 Encounters:  01/28/22 (!) 146/74  12/18/21 134/70  10/30/21 (!) 142/70   RENAL dysfunction:  Renal dysfunction not related to diabetes Has variable levels as follows No history of microalbuminuria  Lab Results  Component Value Date   CREATININE 1.43 01/23/2022   CREATININE 1.31 10/27/2021   CREATININE 1.50 (H) 09/05/2021   Lab Results  Component Value Date   K 4.1 01/23/2022    Most recent eye exam was in 9/22  Most recent foot exam: 4/23   Physical  Examination:  BP (!) 146/74 (BP Location: Left Arm, Patient Position: Sitting, Cuff Size: Normal)   Pulse 66   Ht 5' 9.5" (1.765 m)   Wt 186 lb 9.6 oz (84.6 kg)   BMI 27.16 kg/m          ASSESSMENT:  Diabetes type 2, mild  See history of present illness for detailed discussion of current diabetes management, blood sugar patterns and problems identified  A1c is 6.6  He has only on repaglinide 2 mg before meals and still has not received the Farxiga from patient assistance program  However his blood sugars appear to be improved and not clear whether this is related to dietary changes Monitoring blood sugars mostly in the morning and overall fairly well controlled  CKD: Relatively worse with slight decrease in GFR Normal microalbumin  PLAN:     Given him phone number for the patient assistance for Farxiga so he can check on this Also discussed that he needs to check his blood sugars consistently after meals rather than just in the morning or before dinner This may also help him modify his diet to see what is making his sugar go up, currently unable to pinpoint what makes his sugars higher in the morning some days  Continue taking Prandin before meals and make sure he takes them consistently before starting to eat However if he starts getting blood sugars in the 70s or 80s with starting Farxiga he will let us know to decrease the Prandin  Follow-up in 3 months  There are no Patient Instructions on file for this visit.      [name] 01/28/2022, 10:15 AM   Note: This office note was prepared with Dragon voice recognition system technology. Any transcriptional errors that result from this process are unintentional.   

## 2022-02-02 ENCOUNTER — Other Ambulatory Visit: Payer: Self-pay

## 2022-02-06 ENCOUNTER — Other Ambulatory Visit: Payer: Self-pay | Admitting: Endocrinology

## 2022-02-09 ENCOUNTER — Other Ambulatory Visit: Payer: Self-pay | Admitting: Endocrinology

## 2022-02-16 ENCOUNTER — Other Ambulatory Visit: Payer: Self-pay | Admitting: Cardiology

## 2022-02-26 ENCOUNTER — Other Ambulatory Visit: Payer: Self-pay | Admitting: Rheumatology

## 2022-02-27 ENCOUNTER — Ambulatory Visit (INDEPENDENT_AMBULATORY_CARE_PROVIDER_SITE_OTHER): Payer: Medicare Other | Admitting: Family Medicine

## 2022-02-27 ENCOUNTER — Encounter: Payer: Self-pay | Admitting: Family Medicine

## 2022-02-27 DIAGNOSIS — M1990 Unspecified osteoarthritis, unspecified site: Secondary | ICD-10-CM | POA: Diagnosis not present

## 2022-02-27 DIAGNOSIS — I25810 Atherosclerosis of coronary artery bypass graft(s) without angina pectoris: Secondary | ICD-10-CM

## 2022-02-27 LAB — URIC ACID: Uric Acid, Serum: 7 mg/dL (ref 4.0–7.8)

## 2022-02-27 MED ORDER — PREDNISONE 20 MG PO TABS: ORAL_TABLET | ORAL | 0 refills | Status: AC

## 2022-02-27 NOTE — Progress Notes (Signed)
Established Patient Office Visit  Subjective   Patient ID: [name], male    DOB: 27-Sep-1955  Age: 66 y.o. MRN: [id number]  Chief Complaint  Patient presents with   Bursitis    Patient complains of bursitis in left elbow    Knee Pain    Patient complains of left knee pain.     Patient is reporting left elbow and knee pain. States that he has had this in the past, state she was given prednisone in the past which helped. States he has had other joints swell up and have pain in the past, it moves from his ankles, to his elbows and knees. He denies any fever/chills, states this has been going on for years, will go several months without a flare up. I reviewed his x-ray results and his consultation with the rheumatologist.   Study Result  Narrative & Impression CLINICAL DATA:  Pain beginning last night   EXAM: LEFT ELBOW - COMPLETE 3+ VIEW   COMPARISON:  None.   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 Evidence of chronic flexor and extensor tendinopathy. Regional arterial calcification.   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   Osteoarthritis and chronic tendinopathy changes.     Electronically Signed   By: Nelson Chimes M.D.   On: 05/07/2021 13:39          Review of Systems  All other systems reviewed and are negative.     Objective:     BP (!) 140/70 (BP Location: Left Arm, Patient Position: Sitting, Cuff Size: Normal)   Pulse 80   Temp 97.7 F (36.5 C) (Oral)   Ht 5' 9.5" (1.765 m)   Wt 178 lb 8 oz (81 kg)   SpO2 98%   BMI 25.98 kg/m    Physical Exam Musculoskeletal:       Arms:     Comments: Significant edema and redness of the left wrist on the dorsal aspect of the hand. The wrist and hand are exquisitely tender to palpation. Patient also has large hardened areas on the BL elbows      Results for orders placed or performed in visit on 02/27/22  Uric  Acid  Result Value Ref Range   Uric Acid, Serum 7.0 4.0 - 7.8 mg/dL      The ASCVD Risk score (Arnett DK, et al., 2019) failed to calculate for the following reasons:   The valid total cholesterol range is 130 to 320 mg/dL    Assessment & Plan:   Problem List Items Addressed This Visit       Musculoskeletal and Integument   Inflammatory arthritis (Chronic)    Patient does have a diagnosis of rheumatoid arthritis, and his elbow x-ray showed gouty tophi in October 2022, will treat with a prednisone taper for relief of his symptoms and I will order additional labs including ANA, rheumatoid factor, uric acid and will also rule out lymes disease (although this dx is not likely). Patient is encouraged to follow up with his PCP after his flare has resolved for additional uric acid level.      Relevant Medications   predniSONE (DELTASONE) 20 MG tablet   Other Relevant Orders   Uric Acid (Completed)   Rheumatoid Factor   ANA   B. burgdorfi Antibody    Return in about 3 months (around 05/30/2022) for Dr. Jerilee Hoh follow up on arthritis.    Farrel Conners, MD

## 2022-02-27 NOTE — Assessment & Plan Note (Signed)
Patient does have a diagnosis of rheumatoid arthritis, and his elbow x-ray showed gouty tophi in October 2022, will treat with a prednisone taper for relief of his symptoms and I will order additional labs including ANA, rheumatoid factor, uric acid and will also rule out lymes disease (although this dx is not likely). Patient is encouraged to follow up with his PCP after his flare has resolved for additional uric acid level.

## 2022-02-27 NOTE — Progress Notes (Signed)
Uric acid level is 7 but it might be falsely low because he is having an acute gout flare.  I advise he have it rechecked at his next visit when he is not in ac acute flare up,

## 2022-03-01 LAB — ANTI-NUCLEAR AB-TITER (ANA TITER)
ANA TITER: 1:40 {titer} — ABNORMAL HIGH
ANA Titer 1: 1:40 {titer} — ABNORMAL HIGH

## 2022-03-01 LAB — RHEUMATOID FACTOR: Rheumatoid fact SerPl-aCnc: 18 IU/mL — ABNORMAL HIGH (ref <14)

## 2022-03-01 LAB — ANA: Anti Nuclear Antibody (ANA): POSITIVE — AB

## 2022-03-01 LAB — B. BURGDORFI ANTIBODIES: B burgdorferi Ab IgG+IgM: <0.9 index

## 2022-03-02 ENCOUNTER — Encounter: Payer: Self-pay | Admitting: Internal Medicine

## 2022-03-02 LAB — PSA: PSA: 0.025

## 2022-03-09 ENCOUNTER — Other Ambulatory Visit: Payer: Self-pay | Admitting: Endocrinology

## 2022-03-10 ENCOUNTER — Other Ambulatory Visit: Payer: Self-pay

## 2022-03-10 NOTE — Patient Outreach (Signed)
  Care Coordination   03/10/2022 Name: [name] MRN: [id number] DOB: September 01, 1955   Care Coordination Outreach Attempts:  An unsuccessful telephone outreach was attempted today to offer the patient information about available care coordination services as a benefit of their health plan.   Follow Up Plan:  Additional outreach attempts will be made to offer the patient care coordination information and services.   Encounter Outcome:  No Answer  Care Coordination Interventions Activated:  No   Care Coordination Interventions:  No, not indicated   Peter Garter RN, BSN,CCM, [facility] [phone]

## 2022-03-20 ENCOUNTER — Telehealth: Payer: Self-pay

## 2022-03-20 NOTE — Patient Outreach (Signed)
  Care Coordination   03/20/2022 Name: [name] MRN: [id number] DOB: 1956-02-14   Care Coordination Outreach Attempts:  A second unsuccessful outreach was attempted today to offer the patient with information about available care coordination services as a benefit of their health plan.     Follow Up Plan:  Additional outreach attempts will be made to offer the patient care coordination information and services.   Encounter Outcome:  No Answer  Care Coordination Interventions Activated:  No   Care Coordination Interventions:  No, not indicated    Peter Garter RN, BSN,CCM, [place] [facility] [phone]

## 2022-03-24 ENCOUNTER — Telehealth: Payer: Self-pay | Admitting: *Deleted

## 2022-03-24 NOTE — Telephone Encounter (Signed)
Appointment scheduled.

## 2022-03-24 NOTE — Telephone Encounter (Signed)
Pharmacy faxed a refill request for the Prednisone '20mg'$  taper last given by Dr Legrand Como on 8/18.  Message forwarded to PCP.

## 2022-03-25 ENCOUNTER — Ambulatory Visit: Payer: Medicare Other | Admitting: Internal Medicine

## 2022-03-26 ENCOUNTER — Ambulatory Visit (INDEPENDENT_AMBULATORY_CARE_PROVIDER_SITE_OTHER): Payer: Medicare Other | Admitting: Internal Medicine

## 2022-03-26 ENCOUNTER — Encounter: Payer: Self-pay | Admitting: Internal Medicine

## 2022-03-26 ENCOUNTER — Telehealth: Payer: Self-pay | Admitting: Internal Medicine

## 2022-03-26 VITALS — BP 130/86 | HR 65 | Temp 98.0°F | Wt 180.5 lb

## 2022-03-26 DIAGNOSIS — M0579 Rheumatoid arthritis with rheumatoid factor of multiple sites without organ or systems involvement: Secondary | ICD-10-CM

## 2022-03-26 DIAGNOSIS — I25810 Atherosclerosis of coronary artery bypass graft(s) without angina pectoris: Secondary | ICD-10-CM

## 2022-03-26 DIAGNOSIS — E1165 Type 2 diabetes mellitus with hyperglycemia: Secondary | ICD-10-CM

## 2022-03-26 DIAGNOSIS — Z23 Encounter for immunization: Secondary | ICD-10-CM | POA: Diagnosis not present

## 2022-03-26 LAB — POCT GLYCOSYLATED HEMOGLOBIN (HGB A1C): Hemoglobin A1C: 7.7 % — AB (ref 4.0–5.6)

## 2022-03-26 MED ORDER — PREDNISONE 10 MG (21) PO TBPK: ORAL_TABLET | ORAL | 0 refills | Status: DC

## 2022-03-26 NOTE — Telephone Encounter (Signed)
Pt call and stated he want to talk to dr. Mauri Brooklyn and that is all he would say.

## 2022-03-26 NOTE — Telephone Encounter (Signed)
Patient does not want to see Dr. Estanislado Pandy. New referral placed.

## 2022-03-26 NOTE — Addendum Note (Signed)
Addended by: Westley Hummer B on: 03/26/2022 10:42 AM   Modules accepted: Orders

## 2022-03-26 NOTE — Progress Notes (Signed)
Established Patient Office Visit     CC/Reason for Visit: Requesting prednisone  HPI: [name] is a 66 y.o. male who is coming in today for the above mentioned reasons.  He has a very complex past medical history but mostly significant for rheumatoid arthritis, heart failure, hypertension, hyperlipidemia, multiple myeloma.  He is requesting a flu vaccine today.  He was seen recently by another provider in this office for his RA inflammatory arthritis with bilateral hand swelling and was prescribed prednisone.  He states that while on prednisone he had significant improvement but now that he has completed this treatment the swelling and pain has returned.  He has not seen his rheumatologist in years and is not interested in going back to the same practice.   Past Medical/Surgical History: Past Medical History:  Diagnosis Date   Anemia    Benign localized prostatic hyperplasia with lower urinary tract symptoms (LUTS)    CAD (coronary artery disease)    cardiologist--- dr Martinique;   11-27-2020 cardiac cath for abnormal cardiolite,  severe 3V disease;   11-30-2000 s/p CABG x4;  cath 04-21-2005 patent SVG-OM1 & SVG --PDA graft's,  occluded radial graft -- OM, mod to sev LV dysfunction,  not candidate for revascularization   Chronic diastolic CHF (congestive heart failure) ([place])    followed by cardiologist   Chronic kidney disease (CKD), stage IV (severe) ([place])    nephrologist--- dr Joelyn Oms   (lov note scanned in [place] , epic, dated 09-26-2020)   Dilated cardiomyopathy ([place])    echo in 2009 showed improvement with a normal EF   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 03-08-2021   (09-03-2021  per pt wife is resolving)   History of COVID-19    spring 2022 with mild symptoms that resolved   History of pulmonary embolism 03/13/2020   complete blood thinner   History of sepsis    hx admission for severe sepsis twice;  admitted 03-13-2020 secondary to  aspiration pneumonia w/ ARF and PE;   admitted 10-07-2020 secondary to MSSA uti   History of stroke    per imaging 05-07-2021 head CT ,  remote infarcts left thalamus, basal ganglia   HTN (hypertension)    Hyperlipemia    Ischemic heart disease    cardiologist--- dr Martinique;   Malignant neoplasm prostate [facility]) 05/2021   urologist-- dr eskridge/  oncologist--- dr Alen Blew;  dx 11/ 2022,  Gleason 9, PSA 14   Multiple myeloma in remission [facility]) 2021   oncologist--- dr Irene Limbo--- dx 2021, IgG Kappa type, completed chemo 2021, in remission since   OA (osteoarthritis)    PAD (peripheral artery disease) ([place])    RA (rheumatoid arthritis) [facility])    rheumatologist--- dr s. Estanislado Pandy---  multiple sites,  no other treatment other than takes tylenol arthritis with flare-ups   Tympanic membrane central perforation, left    followed by dr bates (ent)  chronic since 08/ 2022   (09-03-2021  per pt wife has partially closed)   Type 2 diabetes mellitus [facility])    endocrinologist--- dr a [name]   Uses walker    Wears glasses     Past Surgical History:  Procedure Laterality Date   CARDIAC CATHETERIZATION  11/27/2000   '@[facility]'$  by Dr Martinique;   severe 3V disease,  ef 15-25%   CARDIAC CATHETERIZATION  04/21/2005   by Dr Martinique;    patnet SVG -- OM1 & SVG --PDA grafts,  occluded  radial graft --OM1,  atretic LIMA -- Diag,  moderate to severe LV dysfunction,  not candidate for revascularization   CATARACT EXTRACTION W/ INTRAOCULAR LENS IMPLANT Right 08/21/2021   CORONARY ARTERY BYPASS GRAFT  11/30/2000   '@[facility]'$  by Dr Nils Pyle;   LIMA -- Diag,  radial artery -- OM1,  SVG -- OM,  SVG -- RCA   GOLD SEED IMPLANT N/A 09/05/2021   Procedure: GOLD SEED IMPLANT;  Surgeon: Festus Aloe, MD;  Location: [facility];  Service: Urology;  Laterality: N/A;  45 MINS FOR CASE   IR FLUORO GUIDED NEEDLE PLC ASPIRATION/INJECTION LOC  10/24/2019   SPACE OAR INSTILLATION N/A 09/05/2021   Procedure: SPACE OAR INSTILLATION;   Surgeon: Festus Aloe, MD;  Location: [facility];  Service: Urology;  Laterality: N/A;   TRANSURETHRAL RESECTION OF PROSTATE N/A 01/06/2021   Procedure: CYSTOSCOPY WITH CYSTOGRAM/TRANSURETHRAL RESECTION OF THE PROSTATE (TURP);  Surgeon: Lucas Mallow, MD;  Location: [facility];  Service: Urology;  Laterality: N/A;    Social History:  reports that he has never smoked. He has never used smokeless tobacco. He reports that he does not currently use alcohol. He reports that he does not use drugs.  Allergies: Allergies  Allergen Reactions   Metformin And Related Diarrhea    Family History:  Family History  Problem Relation Age of Onset   Hypertension Father    Diabetes Mother        DIABETIC COMA   Healthy Daughter      Current Outpatient Medications:    Accu-Chek Softclix Lancets lancets, Check blood sugar once a day, Disp: 100 each, Rfl: 2   amLODipine (NORVASC) 5 MG tablet, Take 1 tablet by mouth once daily, Disp: 90 tablet, Rfl: 0   aspirin EC 81 MG tablet, Take 81 mg by mouth daily. Swallow whole., Disp: , Rfl:    atorvastatin (LIPITOR) 80 MG tablet, Take 1 tablet by mouth once daily, Disp: 90 tablet, Rfl: 3   Blood Glucose Monitoring Suppl (ACCU-CHEK GUIDE ME) w/Device KIT, 1 each by Does not apply route., Disp: , Rfl:    carvedilol (COREG) 25 MG tablet, Take 1 tablet (25 mg total) by mouth 2 (two) times daily with a meal., Disp: 180 tablet, Rfl: 3   glucose blood (ACCU-CHEK GUIDE) test strip, Check blood sugar once a day, Disp: 100 each, Rfl: 2   hydrALAZINE (APRESOLINE) 50 MG tablet, TAKE 1 TABLET BY MOUTH THREE TIMES DAILY (Patient taking differently: Take 50 mg by mouth 3 (three) times daily.), Disp: 270 tablet, Rfl: 3   hydroxypropyl methylcellulose / hypromellose (ISOPTO TEARS / GONIOVISC) 2.5 % ophthalmic solution, Place 2 drops into the left eye as needed for dry eyes., Disp: 15 mL, Rfl: 2   isosorbide mononitrate (IMDUR) 30 MG 24 hr tablet, Take 1  tablet by mouth once daily, Disp: 90 tablet, Rfl: 2   KLOR-CON M20 20 MEQ tablet, Take 1 tablet by mouth once daily, Disp: 30 tablet, Rfl: 1   predniSONE (STERAPRED UNI-PAK 21 TAB) 10 MG (21) TBPK tablet, Take as directed, Disp: 21 tablet, Rfl: 0   repaglinide (PRANDIN) 2 MG tablet, TAKE 1 TABLET BY MOUTH THREE TIMES DAILY BEFORE MEAL(S), Disp: 180 tablet, Rfl: 2 No current facility-administered medications for this visit.  Facility-Administered Medications Ordered in Other Visits:    sodium chloride flush (NS) 0.9 % injection 10 mL, 10 mL, Intravenous, PRN, Irene Limbo, Cloria Spring, MD   sodium chloride flush (NS) 0.9 % injection 10  mL, 10 mL, Intracatheter, Once PRN, Brunetta Genera, MD  Review of Systems:  Constitutional: Denies fever, chills, diaphoresis, appetite change and fatigue.  HEENT: Denies photophobia, eye pain, redness, hearing loss, ear pain, congestion, sore throat, rhinorrhea, sneezing, mouth sores, trouble swallowing, neck pain, neck stiffness and tinnitus.   Respiratory: Denies SOB, DOE, cough, chest tightness,  and wheezing.   Cardiovascular: Denies chest pain, palpitations and leg swelling.  Gastrointestinal: Denies nausea, vomiting, abdominal pain, diarrhea, constipation, blood in stool and abdominal distention.  Genitourinary: Denies dysuria, urgency, frequency, hematuria, flank pain and difficulty urinating.  Endocrine: Denies: hot or cold intolerance, sweats, changes in hair or nails, polyuria, polydipsia. Musculoskeletal: Positive for myalgias, back pain, joint swelling, arthralgias and gait problem.  Skin: Denies pallor, rash and wound.  Neurological: Denies dizziness, seizures, syncope, weakness, light-headedness, numbness and headaches.  Hematological: Denies adenopathy. Easy bruising, personal or family bleeding history  Psychiatric/Behavioral: Denies suicidal ideation, mood changes, confusion, nervousness, sleep disturbance and agitation    Physical  Exam: Vitals:   03/26/22 1011  BP: 130/86  Pulse: 65  Temp: 98 F (36.7 C)  TempSrc: Oral  SpO2: 99%  Weight: 180 lb 8 oz (81.9 kg)    Body mass index is 26.27 kg/m.   Constitutional: NAD, calm, comfortable Eyes: PERRL, lids and conjunctivae normal, wears corrective lenses ENMT: Mucous membranes are moist.  Respiratory: clear to auscultation bilaterally, no wheezing, no crackles. Normal respiratory effort. No accessory muscle use.  Cardiovascular: Regular rate and rhythm, no murmurs / rubs / gallops. No extremity edema. Psychiatric: Normal judgment and insight. Alert and oriented x 3. Normal mood.    Impression and Plan:  Rheumatoid arthritis involving multiple sites with positive rheumatoid factor ([place]) - Plan: predniSONE (STERAPRED UNI-PAK 21 TAB) 10 MG (21) TBPK tablet, Ambulatory referral to Rheumatology  Uncontrolled type 2 diabetes mellitus with hyperglycemia ([place]) - Plan: POCT glycosylated hemoglobin (Hb A1C)  -He has very active RA as demonstrated by significant swelling of MCP and PIP joints of both hands especially.  I will send referral to rheumatology, okay for prednisone taper although long-term prednisone would not be ideal for him given his diabetes and an A1c that has risen to 7.7.  I believe he needs to be on baseline RA treatment. -Flu vaccine given today.  Time spent:30 minutes reviewing chart, interviewing and examining patient and formulating plan of care.     Lelon Frohlich, MD [facility]

## 2022-03-31 ENCOUNTER — Telehealth: Payer: Self-pay | Admitting: Cardiology

## 2022-03-31 MED ORDER — HYDRALAZINE HCL 50 MG PO TABS: 50.0000 mg | ORAL_TABLET | Freq: Three times a day (TID) | ORAL | 3 refills | Status: DC

## 2022-03-31 NOTE — Telephone Encounter (Signed)
*  STAT* If patient is at the pharmacy, call can be transferred to refill team.   1. Which medications need to be refilled? (please list name of each medication and dose if known) hydrALAZINE (APRESOLINE) 50 MG tablet  2. Which pharmacy/location (including street and city if local pharmacy) is medication to be sent to? [place] ([place]), [place] - [address]  3. Do they need a 30 day or 90 day supply? [place]

## 2022-04-03 ENCOUNTER — Telehealth: Payer: Self-pay

## 2022-04-03 NOTE — Patient Outreach (Signed)
  Care Coordination   Initial Visit Note   04/03/2022 Name: [name] MRN: [id number] DOB: [birth year]  [name] is a 66 y.o. year old male who sees [name], Joseph Halsted, MD for primary care. I spoke with  [name] by phone today.  What matters to the patients health and wellness today?  No concerns today    Goals Addressed             This Visit's Progress    COMPLETED: Care Coordination Activities - no follow up required       Care Coordination Interventions: Advised patient to call to schedule Annual Wellness Visit Provided education to patient re: Annual Wellness Visit, importance of annual eye exam, care coordination services Reviewed medications with patient and discussed adherence Assessed social determinant of health barriers         SDOH assessments and interventions completed:  Yes  SDOH Interventions Today    Flowsheet Row Most Recent Value  SDOH Interventions   Food Insecurity Interventions Intervention Not Indicated  Transportation Interventions Intervention Not Indicated  Utilities Interventions Intervention Not Indicated        Care Coordination Interventions Activated:  Yes  Care Coordination Interventions:  Yes, provided   Follow up plan: No further intervention required.   Encounter Outcome:  Pt. Visit Completed  Peter Garter RN, BSN,CCM, CDE Care Management Coordinator [place] [facility] [phone]

## 2022-04-03 NOTE — Patient Instructions (Signed)
Visit Information  Thank you for taking time to visit with me today. Please don't hesitate to contact me if I can be of assistance to you.   Following are the goals we discussed today:   Goals Addressed             This Visit's Progress    COMPLETED: Care Coordination Activities - no follow up required       Care Coordination Interventions: Advised patient to call to schedule Annual Wellness Visit Provided education to patient re: Annual Wellness Visit, importance of annual eye exam, care coordination services Reviewed medications with patient and discussed adherence Assessed social determinant of health barriers         If you are experiencing a Mental Health or [place] or need someone to talk to, please call the Suicide and Crisis Lifeline: 988 call the [place] National Suicide Prevention Lifeline: 707-311-8205 or TTY: 862-854-7670 TTY 250-862-9203) to talk to a trained counselor call 1-800-273-TALK (toll free, 24 hour hotline) go to [facility] [address], [place] [phone]) call 911   Patient verbalizes understanding of instructions and care plan provided today and agrees to view in [place]. Active MyChart status and patient understanding of how to access instructions and care plan via MyChart confirmed with patient.     No further follow up required:    Peter Garter RN, Jackquline Denmark, [place] [facility] [phone]

## 2022-04-17 ENCOUNTER — Other Ambulatory Visit: Payer: Self-pay | Admitting: Cardiology

## 2022-05-05 ENCOUNTER — Other Ambulatory Visit: Payer: Self-pay | Admitting: Endocrinology

## 2022-05-21 LAB — PSA: PSA: 0.018

## 2022-05-28 ENCOUNTER — Ambulatory Visit: Payer: Medicare Other | Admitting: Internal Medicine

## 2022-06-01 ENCOUNTER — Ambulatory Visit: Payer: Medicare Other | Admitting: Internal Medicine

## 2022-06-01 ENCOUNTER — Encounter: Payer: Self-pay | Admitting: Internal Medicine

## 2022-06-03 ENCOUNTER — Encounter: Payer: Self-pay | Admitting: Internal Medicine

## 2022-06-03 ENCOUNTER — Ambulatory Visit (INDEPENDENT_AMBULATORY_CARE_PROVIDER_SITE_OTHER): Payer: Medicare Other | Admitting: Internal Medicine

## 2022-06-03 VITALS — BP 173/93 | HR 58 | Temp 98.2°F | Wt 195.7 lb

## 2022-06-03 DIAGNOSIS — I25810 Atherosclerosis of coronary artery bypass graft(s) without angina pectoris: Secondary | ICD-10-CM

## 2022-06-03 DIAGNOSIS — M069 Rheumatoid arthritis, unspecified: Secondary | ICD-10-CM

## 2022-06-03 DIAGNOSIS — I10 Essential (primary) hypertension: Secondary | ICD-10-CM

## 2022-06-03 NOTE — Progress Notes (Signed)
Established Patient Office Visit     CC/Reason for Visit: Requesting prednisone  HPI: [name] is a 66 y.o. male who is coming in today for the above mentioned reasons. Past Medical History is significant for: Rheumatoid arthritis and hypertension.  I have not seen him in some time.  He is requesting prednisone for his RA.  He has not seen a rheumatologist in years.  At a previous visit I had put in a referral and that appointment is on December 6.  I had explained at that previous visit how ongoing prednisone has significant side effects.  At that time he had significant inflammation of his hands.  Not so today.  He states the prednisone really helps with his joint stiffness, I am sure it does.  His blood pressure is noted to be elevated.   Past Medical/Surgical History: Past Medical History:  Diagnosis Date   Anemia    Benign localized prostatic hyperplasia with lower urinary tract symptoms (LUTS)    CAD (coronary artery disease)    cardiologist--- dr Martinique;   11-27-2020 cardiac cath for abnormal cardiolite,  severe 3V disease;   11-30-2000 s/p CABG x4;  cath 04-21-2005 patent SVG-OM1 & SVG --PDA graft's,  occluded radial graft -- OM, mod to sev LV dysfunction,  not candidate for revascularization   Chronic diastolic CHF (congestive heart failure) ([place])    followed by cardiologist   Chronic kidney disease (CKD), stage IV (severe) ([place])    nephrologist--- dr Joelyn Oms   (lov note scanned in [place] , epic, dated 09-26-2020)   Dilated cardiomyopathy ([place])    echo in 2009 showed improvement with a normal EF   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 03-08-2021   (09-03-2021  per pt wife is resolving)   History of COVID-19    spring 2022 with mild symptoms that resolved   History of pulmonary embolism 03/13/2020   complete blood thinner   History of sepsis    hx admission for severe sepsis twice;  admitted 03-13-2020 secondary to aspiration  pneumonia w/ ARF and PE;   admitted 10-07-2020 secondary to MSSA uti   History of stroke    per imaging 05-07-2021 head CT ,  remote infarcts left thalamus, basal ganglia   HTN (hypertension)    Hyperlipemia    Ischemic heart disease    cardiologist--- dr Martinique;   Malignant neoplasm prostate [facility]) 05/2021   urologist-- dr eskridge/  oncologist--- dr Alen Blew;  dx 11/ 2022,  Gleason 9, PSA 14   Multiple myeloma in remission [facility]) 2021   oncologist--- dr Irene Limbo--- dx 2021, IgG Kappa type, completed chemo 2021, in remission since   OA (osteoarthritis)    PAD (peripheral artery disease) ([place])    RA (rheumatoid arthritis) [facility])    rheumatologist--- dr s. Estanislado Pandy---  multiple sites,  no other treatment other than takes tylenol arthritis with flare-ups   Tympanic membrane central perforation, left    followed by dr bates (ent)  chronic since 08/ 2022   (09-03-2021  per pt wife has partially closed)   Type 2 diabetes mellitus [facility])    endocrinologist--- dr a [name]   Uses walker    Wears glasses     Past Surgical History:  Procedure Laterality Date   CARDIAC CATHETERIZATION  11/27/2000   _0  by Dr Martinique;   severe 3V disease,  ef 15-25%   CARDIAC CATHETERIZATION  04/21/2005   by Dr Martinique;  patnet SVG -- OM1 & SVG --PDA grafts,  occluded radial graft --OM1,  atretic LIMA -- Diag,  moderate to severe LV dysfunction,  not candidate for revascularization   CATARACT EXTRACTION W/ INTRAOCULAR LENS IMPLANT Right 08/21/2021   CORONARY ARTERY BYPASS GRAFT  11/30/2000   _0  by Dr Nils Pyle;   LIMA -- Diag,  radial artery -- OM1,  SVG -- OM,  SVG -- RCA   GOLD SEED IMPLANT N/A 09/05/2021   Procedure: GOLD SEED IMPLANT;  Surgeon: Festus Aloe, MD;  Location: [facility];  Service: Urology;  Laterality: N/A;  45 MINS FOR CASE   IR FLUORO GUIDED NEEDLE PLC ASPIRATION/INJECTION LOC  10/24/2019   SPACE OAR INSTILLATION N/A 09/05/2021   Procedure: SPACE OAR INSTILLATION;  Surgeon:  Festus Aloe, MD;  Location: [facility];  Service: Urology;  Laterality: N/A;   TRANSURETHRAL RESECTION OF PROSTATE N/A 01/06/2021   Procedure: CYSTOSCOPY WITH CYSTOGRAM/TRANSURETHRAL RESECTION OF THE PROSTATE (TURP);  Surgeon: Lucas Mallow, MD;  Location: [facility];  Service: Urology;  Laterality: N/A;    Social History:  reports that he has never smoked. He has never used smokeless tobacco. He reports that he does not currently use alcohol. He reports that he does not use drugs.  Allergies: Allergies  Allergen Reactions   Metformin And Related Diarrhea    Family History:  Family History  Problem Relation Age of Onset   Hypertension Father    Diabetes Mother        DIABETIC COMA   Healthy Daughter      Current Outpatient Medications:    Accu-Chek Softclix Lancets lancets, Check blood sugar once a day, Disp: 100 each, Rfl: 2   amLODipine (NORVASC) 5 MG tablet, Take 1 tablet by mouth once daily, Disp: 90 tablet, Rfl: 3   aspirin EC 81 MG tablet, Take 81 mg by mouth daily. Swallow whole., Disp: , Rfl:    atorvastatin (LIPITOR) 80 MG tablet, Take 1 tablet by mouth once daily, Disp: 90 tablet, Rfl: 3   Blood Glucose Monitoring Suppl (ACCU-CHEK GUIDE ME) w/Device KIT, 1 each by Does not apply route., Disp: , Rfl:    carvedilol (COREG) 25 MG tablet, Take 1 tablet (25 mg total) by mouth 2 (two) times daily with a meal., Disp: 180 tablet, Rfl: 3   glucose blood (ACCU-CHEK GUIDE) test strip, Check blood sugar once a day, Disp: 100 each, Rfl: 2   hydrALAZINE (APRESOLINE) 50 MG tablet, Take 1 tablet (50 mg total) by mouth 3 (three) times daily., Disp: 270 tablet, Rfl: 3   hydroxypropyl methylcellulose / hypromellose (ISOPTO TEARS / GONIOVISC) 2.5 % ophthalmic solution, Place 2 drops into the left eye as needed for dry eyes., Disp: 15 mL, Rfl: 2   isosorbide mononitrate (IMDUR) 30 MG 24 hr tablet, Take 1 tablet by mouth once daily, Disp: 90 tablet, Rfl: 2   KLOR-CON M20  20 MEQ tablet, Take 1 tablet by mouth once daily, Disp: 30 tablet, Rfl: 2   predniSONE (STERAPRED UNI-PAK 21 TAB) 10 MG (21) TBPK tablet, Take as directed, Disp: 21 tablet, Rfl: 0   repaglinide (PRANDIN) 2 MG tablet, TAKE 1 TABLET BY MOUTH THREE TIMES DAILY BEFORE MEAL(S), Disp: 180 tablet, Rfl: 2 No current facility-administered medications for this visit.  Facility-Administered Medications Ordered in Other Visits:    sodium chloride flush (NS) 0.9 % injection 10 mL, 10 mL, Intravenous, PRN, Brunetta Genera, MD   sodium chloride flush (NS) 0.9 %  injection 10 mL, 10 mL, Intracatheter, Once PRN, Brunetta Genera, MD  Review of Systems:  Constitutional: Denies fever, chills, diaphoresis, appetite change and fatigue.  HEENT: Denies photophobia, eye pain, redness, hearing loss, ear pain, congestion, sore throat, rhinorrhea, sneezing, mouth sores, trouble swallowing, neck pain, neck stiffness and tinnitus.   Respiratory: Denies SOB, DOE, cough, chest tightness,  and wheezing.   Cardiovascular: Denies chest pain, palpitations and leg swelling.  Gastrointestinal: Denies nausea, vomiting, abdominal pain, diarrhea, constipation, blood in stool and abdominal distention.  Genitourinary: Denies dysuria, urgency, frequency, hematuria, flank pain and difficulty urinating.  Endocrine: Denies: hot or cold intolerance, sweats, changes in hair or nails, polyuria, polydipsia. Musculoskeletal: Denies myalgias, back pain.  Skin: Denies pallor, rash and wound.  Neurological: Denies dizziness, seizures, syncope, weakness, light-headedness, numbness and headaches.  Hematological: Denies adenopathy. Easy bruising, personal or family bleeding history  Psychiatric/Behavioral: Denies suicidal ideation, mood changes, confusion, nervousness, sleep disturbance and agitation    Physical Exam: Vitals:   06/03/22 1023 06/03/22 1027  BP: (!) 150/78 (!) 173/93  Pulse: (!) 58   Temp: 98.2 F (36.8 C)   TempSrc:  Oral   SpO2: 98%   Weight: 195 lb 11.2 oz (88.8 kg)     Body mass index is 28.49 kg/m.   Constitutional: NAD, calm, comfortable Eyes: PERRL, lids and conjunctivae normal, wears corrective lenses ENMT: Mucous membranes are moist.  Respiratory: clear to auscultation bilaterally, no wheezing, no crackles. Normal respiratory effort. No accessory muscle use.  Cardiovascular: Regular rate and rhythm, no murmurs / rubs / gallops. No extremity edema.   Psychiatric: Normal judgment and insight. Alert and oriented x 3. Normal mood.    Impression and Plan:  Rheumatoid arthritis involving multiple sites, unspecified whether rheumatoid factor present ([place])  Primary hypertension  -Blood pressure is elevated.  We have gone over his medication list to confirm he is being compliant.  It appears he has.  He will do ambulatory blood pressure monitoring and return in 3 months for follow-up. -He is overdue for annual wellness visit and will schedule. -He has his appointment with his rheumatologist on December 4 to discuss disease modifying agents for his rheumatoid arthritis.  I will not prescribe prednisone today.  He has had at least 3 courses in the last 6 months that I can document.  Time spent:30 minutes reviewing chart, interviewing and examining patient and formulating plan of care.     Lelon Frohlich, MD [facility]

## 2022-06-04 ENCOUNTER — Other Ambulatory Visit: Payer: Self-pay

## 2022-06-08 ENCOUNTER — Other Ambulatory Visit (INDEPENDENT_AMBULATORY_CARE_PROVIDER_SITE_OTHER): Payer: Medicare Other

## 2022-06-08 DIAGNOSIS — E785 Hyperlipidemia, unspecified: Secondary | ICD-10-CM | POA: Diagnosis not present

## 2022-06-08 DIAGNOSIS — E1165 Type 2 diabetes mellitus with hyperglycemia: Secondary | ICD-10-CM

## 2022-06-08 LAB — LDL CHOLESTEROL, DIRECT: Direct LDL: 50 mg/dL

## 2022-06-08 LAB — BASIC METABOLIC PANEL
BUN: 23 mg/dL (ref 6–23)
CO2: 25 mEq/L (ref 19–32)
Calcium: 9.3 mg/dL (ref 8.4–10.5)
Chloride: 104 mEq/L (ref 96–112)
Creatinine, Ser: 1.34 mg/dL (ref 0.40–1.50)
GFR: 55.03 mL/min — ABNORMAL LOW (ref >60.00)
Glucose, Bld: 197 mg/dL — ABNORMAL HIGH (ref 70–99)
Potassium: 3.5 mEq/L (ref 3.5–5.1)
Sodium: 139 mEq/L (ref 135–145)

## 2022-06-08 LAB — HEMOGLOBIN A1C: Hgb A1c MFr Bld: 7.4 % — ABNORMAL HIGH (ref 4.6–6.5)

## 2022-06-10 ENCOUNTER — Encounter: Payer: Self-pay | Admitting: Endocrinology

## 2022-06-10 ENCOUNTER — Encounter: Payer: Self-pay | Admitting: Internal Medicine

## 2022-06-10 ENCOUNTER — Ambulatory Visit (INDEPENDENT_AMBULATORY_CARE_PROVIDER_SITE_OTHER): Payer: Medicare Other | Admitting: Endocrinology

## 2022-06-10 VITALS — BP 170/90 | HR 75 | Ht 69.5 in | Wt 198.0 lb

## 2022-06-10 DIAGNOSIS — I25810 Atherosclerosis of coronary artery bypass graft(s) without angina pectoris: Secondary | ICD-10-CM

## 2022-06-10 DIAGNOSIS — E1165 Type 2 diabetes mellitus with hyperglycemia: Secondary | ICD-10-CM | POA: Diagnosis not present

## 2022-06-10 IMAGING — CR DG CHEST 2V
2 series · 2 of 2 positions shown · non-contrast
Comparison: 04/23/2020

CLINICAL DATA: Pneumonia, follow-up examination

EXAM:
CHEST - 2 VIEW

[w chest pa]
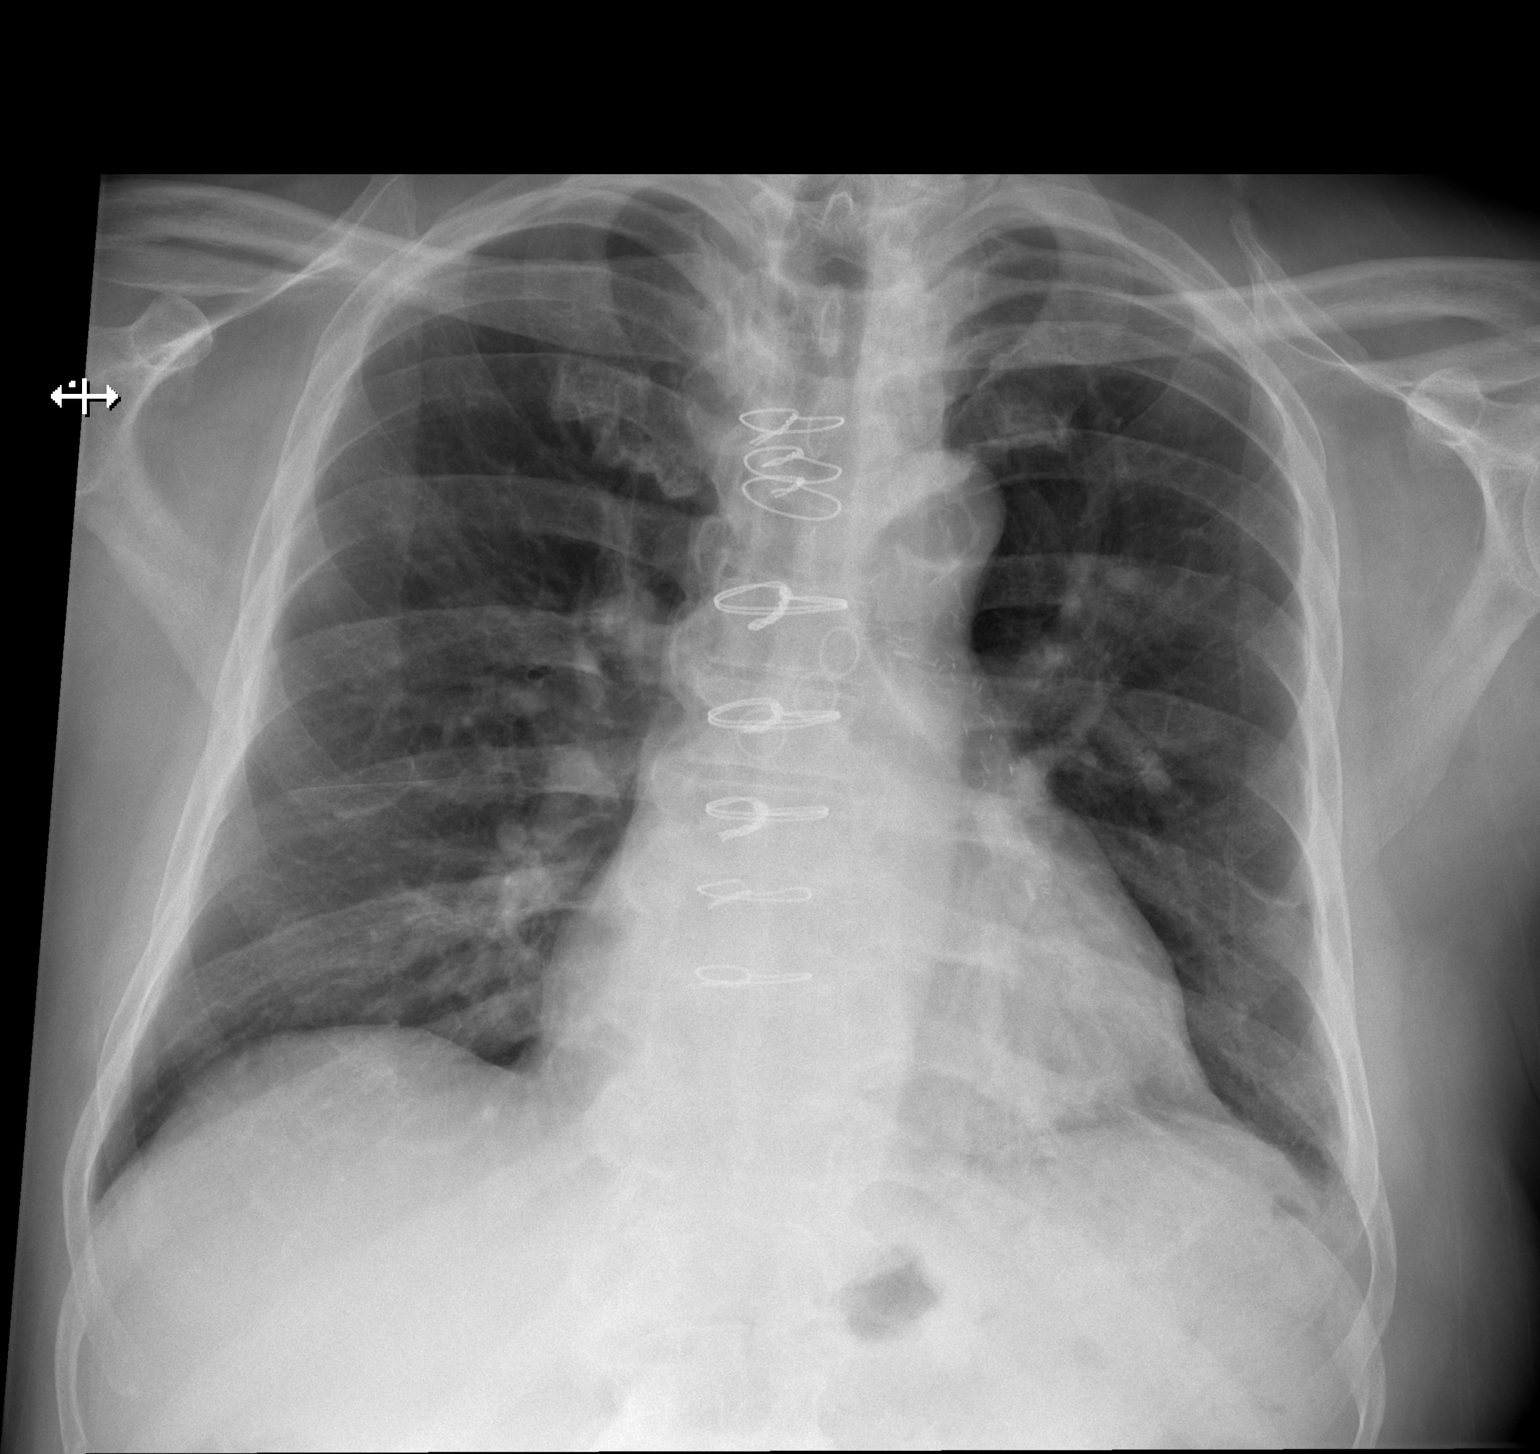

[w chest lat]
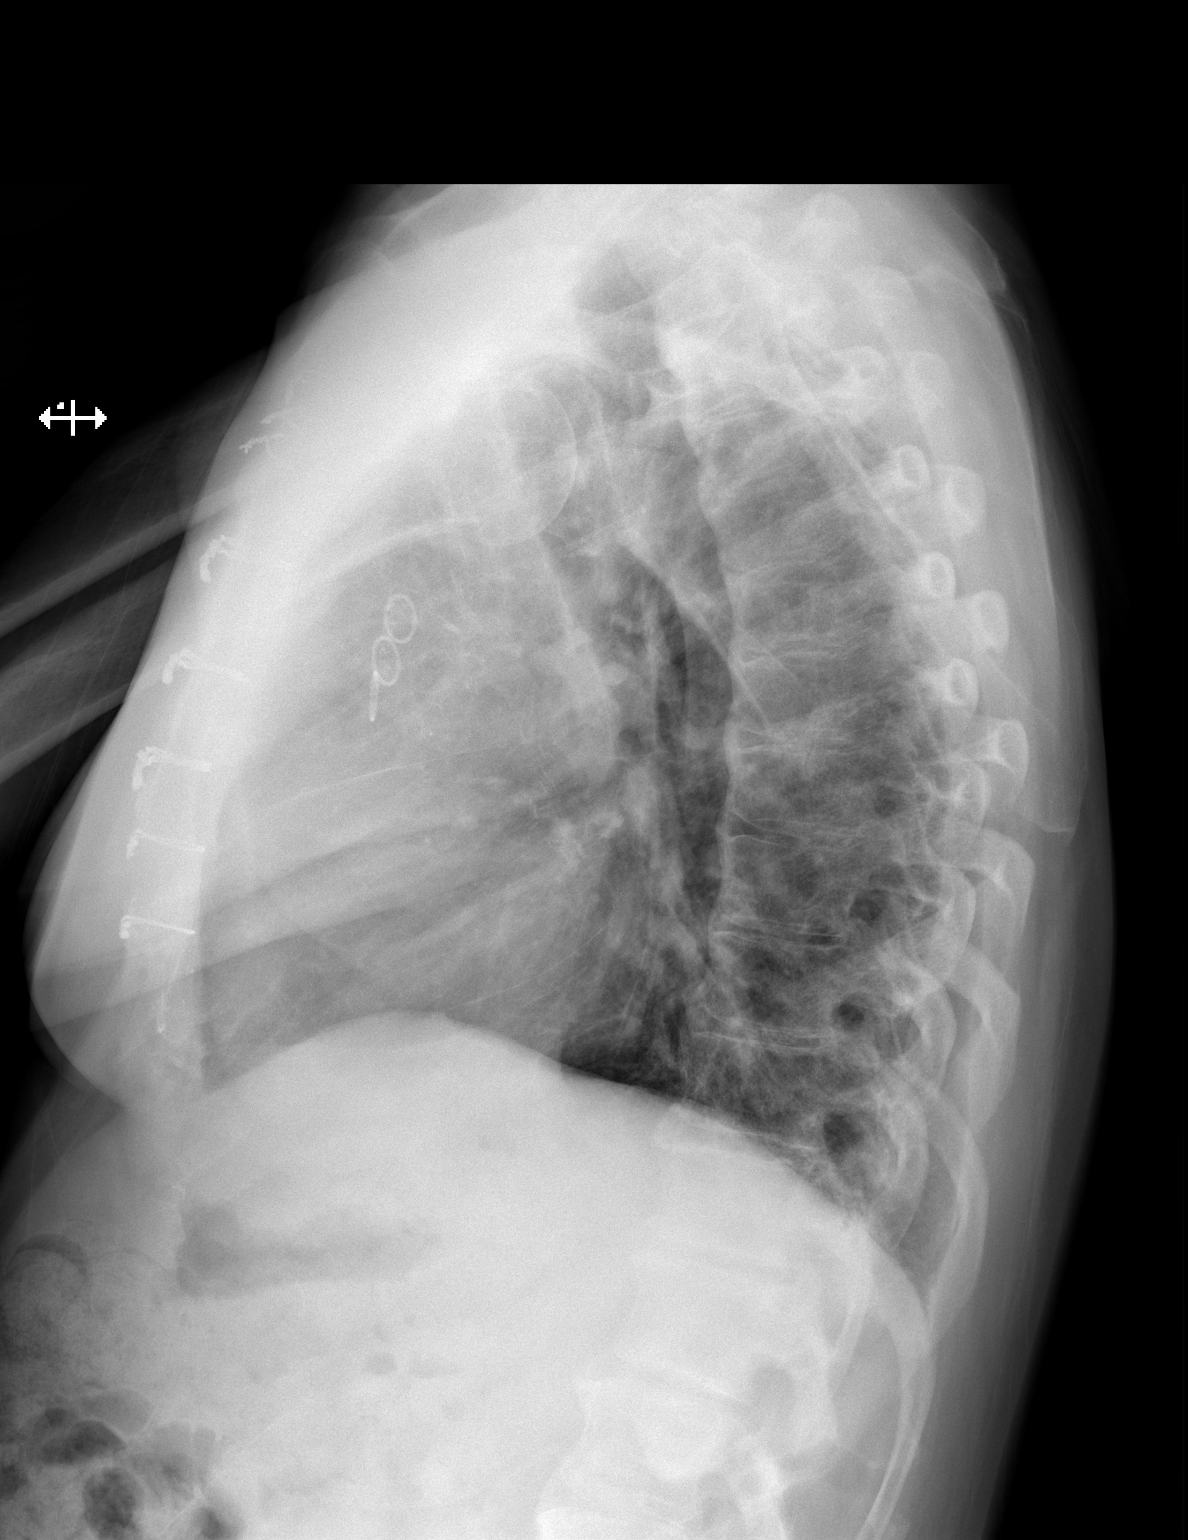

[2 of 2 positions shown; findings below may reference images not displayed]

FINDINGS: Lungs are clear. No pneumothorax or pleural effusion. Cardiac size
is within normal limits. Coronary artery bypass grafting has been
performed. Pulmonary vascularity is normal. Degenerative changes are
seen within the thoracic spine. No acute bone abnormality.
IMPRESSION: Resolved pulmonary infiltrates. No radiographic evidence of acute
cardiopulmonary disease.

## 2022-06-10 NOTE — Progress Notes (Signed)
Patient ID: [name], male   DOB: November 05, 1955, 66 y.o.   MRN: [id number]           Reason for Appointment: Follow-up for Type 2 Diabetes   History of Present Illness:          Date of diagnosis of type 2 diabetes mellitus:   2018      Background history:  He has never been told to have diabetes but review of his previous labs indicate that he had high blood sugar 179 in 2014 His highest sugar has been 234 in 10/2016 when his A1c was 8.8, not clear if he was symptomatic at that time  Recent history:   A1c is back up to 7.4 compared to 6.6   Non-insulin hypoglycemic drugs the patient is taking are:   Prandin 2 mg at meals, mostly in the morning  Current management, blood sugar patterns and problems identified:  He still has not received his [place] and not clear why, patient assistance program was initiated and he has not heard from them  His lab glucose was 197 after breakfast but he is checking readings only in the morning Weight appears to have gone up since last visit He says that he forgets his Prandin at mealtimes and mostly taking it in the morning Also his morning sugars are to be higher the last 3 days although previously not as high He appears to be eating a balanced meal in the morning at breakfast with eggs and bacon and toast Still not walking for exercise, does have some joint pains           Glucose monitoring:   has Accu-Chek guide meter  FBS:96-149 recently  Previously:  PRE-MEAL Fasting Lunch Dinner Bedtime Overall  Glucose range: 92-178  88, 112    Mean/median:     121   POST-MEAL PC Breakfast PC Lunch PC Dinner  Glucose range:   ?  Mean/median:      Previously: Blood sugars from review of monitor shows blood sugars 79 and 112 fasting   Dietician visit, most recent: 2/21  Weight history:  Wt Readings from Last 3 Encounters:  06/10/22 198 lb (89.8 kg)  06/03/22 195 lb 11.2 oz (88.8 kg)  03/26/22 180 lb 8 oz (81.9 kg)    Glycemic  control:   Lab Results  Component Value Date   HGBA1C 7.4 (H) 06/08/2022   HGBA1C 7.7 (A) 03/26/2022   HGBA1C 6.6 (H) 01/23/2022   Lab Results  Component Value Date   MICROALBUR 12.6 (H) 07/21/2018   LDLCALC 39 09/04/2019   CREATININE 1.34 06/08/2022   Lab Results  Component Value Date   MICRALBCREAT 10.0 07/21/2018    Lab Results  Component Value Date   FRUCTOSAMINE 297 (H) 07/22/2021   FRUCTOSAMINE 347 (H) 04/14/2021   FRUCTOSAMINE 237 11/08/2020   Lab Results  Component Value Date   HGB 12.9 (L) 09/05/2021    Lab on 06/08/2022  Component Date Value Ref Range Status   Direct LDL 06/08/2022 50.0  mg/dL Final   Optimal:  <100 mg/dLNear or Above Optimal:  100-129 mg/dLBorderline High:  130-159 mg/dLHigh:  160-189 mg/dLVery High:  >190 mg/dL   Sodium 06/08/2022 139  135 - 145 mEq/L Final   Potassium 06/08/2022 3.5  3.5 - 5.1 mEq/L Final   Chloride 06/08/2022 104  96 - 112 mEq/L Final   CO2 06/08/2022 25  19 - 32 mEq/L Final   Glucose, Bld 06/08/2022 197 (H)  70 - 99  mg/dL Final   BUN 06/08/2022 23  6 - 23 mg/dL Final   Creatinine, Ser 06/08/2022 1.34  0.40 - 1.50 mg/dL Final   GFR 06/08/2022 55.03 (L)  >60.00 mL/min Final   Calculated using the CKD-EPI Creatinine Equation (2021)   Calcium 06/08/2022 9.3  8.4 - 10.5 mg/dL Final   Hgb A1c MFr Bld 06/08/2022 7.4 (H)  4.6 - 6.5 % Final   Glycemic Control Guidelines for People with Diabetes:Non Diabetic:  <6%Goal of Therapy: <7%Additional Action Suggested:  >8%     Allergies as of 06/10/2022       Reactions   Metformin And Related Diarrhea        Medication List        Accurate as of June 10, 2022 10:11 AM. If you have any questions, ask your nurse or doctor.          Accu-Chek Guide Me w/Device Kit 1 each by Does not apply route.   Accu-Chek Guide test strip Generic drug: glucose blood Check blood sugar once a day   Accu-Chek Softclix Lancets lancets Check blood sugar once a day   amLODipine 5  MG tablet Commonly known as: NORVASC Take 1 tablet by mouth once daily   aspirin EC 81 MG tablet Take 81 mg by mouth daily. Swallow whole.   atorvastatin 80 MG tablet Commonly known as: LIPITOR Take 1 tablet by mouth once daily   carvedilol 25 MG tablet Commonly known as: COREG Take 1 tablet (25 mg total) by mouth 2 (two) times daily with a meal.   hydrALAZINE 50 MG tablet Commonly known as: APRESOLINE Take 1 tablet (50 mg total) by mouth 3 (three) times daily.   hydroxypropyl methylcellulose / hypromellose 2.5 % ophthalmic solution Commonly known as: ISOPTO TEARS / GONIOVISC Place 2 drops into the left eye as needed for dry eyes.   isosorbide mononitrate 30 MG 24 hr tablet Commonly known as: IMDUR Take 1 tablet by mouth once daily   Klor-Con M20 20 MEQ tablet Generic drug: potassium chloride SA Take 1 tablet by mouth once daily   predniSONE 10 MG (21) Tbpk tablet Commonly known as: STERAPRED UNI-PAK 21 TAB Take as directed   repaglinide 2 MG tablet Commonly known as: PRANDIN TAKE 1 TABLET BY MOUTH THREE TIMES DAILY BEFORE MEAL(S)        Allergies:  Allergies  Allergen Reactions   Metformin And Related Diarrhea    Past Medical History:  Diagnosis Date   Anemia    Benign localized prostatic hyperplasia with lower urinary tract symptoms (LUTS)    CAD (coronary artery disease)    cardiologist--- dr Martinique;   11-27-2020 cardiac cath for abnormal cardiolite,  severe 3V disease;   11-30-2000 s/p CABG x4;  cath 04-21-2005 patent SVG-OM1 & SVG --PDA graft's,  occluded radial graft -- OM, mod to sev LV dysfunction,  not candidate for revascularization   Chronic diastolic CHF (congestive heart failure) ([place])    followed by cardiologist   Chronic kidney disease (CKD), stage IV (severe) [facility])    nephrologist--- dr Joelyn Oms   (lov note scanned in [place] , epic, dated 09-26-2020)   Dilated cardiomyopathy ([place])    echo in 2009 showed improvement with a normal EF   Facial  paralysis on left side    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 03-08-2021   (09-03-2021  per pt wife is resolving)   History of COVID-19    spring 2022 with mild symptoms that resolved   History of  pulmonary embolism 03/13/2020   complete blood thinner   History of sepsis    hx admission for severe sepsis twice;  admitted 03-13-2020 secondary to aspiration pneumonia w/ ARF and PE;   admitted 10-07-2020 secondary to MSSA uti   History of stroke    per imaging 05-07-2021 head CT ,  remote infarcts left thalamus, basal ganglia   HTN (hypertension)    Hyperlipemia    Ischemic heart disease    cardiologist--- dr Martinique;   Malignant neoplasm prostate [facility]) 05/2021   urologist-- dr eskridge/  oncologist--- dr Alen Blew;  dx 11/ 2022,  Gleason 9, PSA 14   Multiple myeloma in remission [facility]) 2021   oncologist--- dr Irene Limbo--- dx 2021, IgG Kappa type, completed chemo 2021, in remission since   OA (osteoarthritis)    PAD (peripheral artery disease) ([place])    RA (rheumatoid arthritis) [facility])    rheumatologist--- dr s. Estanislado Pandy---  multiple sites,  no other treatment other than takes tylenol arthritis with flare-ups   Tympanic membrane central perforation, left    followed by dr bates (ent)  chronic since 08/ 2022   (09-03-2021  per pt wife has partially closed)   Type 2 diabetes mellitus [facility])    endocrinologist--- dr a [name]   Uses walker    Wears glasses     Past Surgical History:  Procedure Laterality Date   CARDIAC CATHETERIZATION  11/27/2000   _0  by Dr Martinique;   severe 3V disease,  ef 15-25%   CARDIAC CATHETERIZATION  04/21/2005   by Dr Martinique;    patnet SVG -- OM1 & SVG --PDA grafts,  occluded radial graft --OM1,  atretic LIMA -- Diag,  moderate to severe LV dysfunction,  not candidate for revascularization   CATARACT EXTRACTION W/ INTRAOCULAR LENS IMPLANT Right 08/21/2021   CORONARY ARTERY BYPASS GRAFT  11/30/2000   _1  by Dr Nils Pyle;   LIMA -- Diag,  radial artery -- OM1,  SVG -- OM,  SVG  -- RCA   GOLD SEED IMPLANT N/A 09/05/2021   Procedure: GOLD SEED IMPLANT;  Surgeon: Festus Aloe, MD;  Location: [facility];  Service: Urology;  Laterality: N/A;  45 MINS FOR CASE   IR FLUORO GUIDED NEEDLE PLC ASPIRATION/INJECTION LOC  10/24/2019   SPACE OAR INSTILLATION N/A 09/05/2021   Procedure: SPACE OAR INSTILLATION;  Surgeon: Festus Aloe, MD;  Location[facility];  Service: Urology;  Laterality: N/A;   TRANSURETHRAL RESECTION OF PROSTATE N/A 01/06/2021   Procedure: CYSTOSCOPY WITH CYSTOGRAM/TRANSURETHRAL RESECTION OF THE PROSTATE (TURP);  Surgeon: Lucas Mallow, MD;  Location[facility];  Service: Urology;  Laterality: N/A;    Family History  Problem Relation Age of Onset   Hypertension Father    Diabetes Mother        DIABETIC COMA   Healthy Daughter     Social History:  reports that he has never smoked. He has never used smokeless tobacco. He reports that he does not currently use alcohol. He reports that he does not use drugs.   Review of Systems   Lipid history: On atorvastatin 80 mg from his cardiologist since diagnosis of CAD, labs as follows    Lab Results  Component Value Date   CHOL 92 (L) 09/04/2019   HDL 35 (L) 09/04/2019   LDLCALC 39 09/04/2019   LDLDIRECT 50.0 06/08/2022   TRIG 89 09/04/2019   CHOLHDL 2.6 09/04/2019           Hypertension: Has been  followed by cardiologist for blood pressure management Blood pressure is again high  Aldosterone level has been checked for his history of hypokalemia and was normal  Recent readings:  BP Readings from Last 3 Encounters:  06/10/22 (!) 170/90  06/03/22 (!) 173/93  03/26/22 130/86   RENAL dysfunction:  Renal dysfunction: not related to diabetes Recently stable values No history of microalbuminuria  Lab Results  Component Value Date   CREATININE 1.34 06/08/2022   CREATININE 1.43 01/23/2022   CREATININE 1.31 10/27/2021   Lab Results  Component Value  Date   K 3.5 06/08/2022    Most recent eye exam was in 9/22  Most recent foot exam: 4/23   Physical Examination:  BP (!) 170/90   Pulse 75   Ht 5' 9.5" (1.765 m)   Wt 198 lb (89.8 kg)   SpO2 99%   BMI 28.82 kg/m          ASSESSMENT:  Diabetes type 2, mild  See history of present illness for detailed discussion of current diabetes management, blood sugar patterns and problems identified  A1c is 7.4  He has only on repaglinide 2 mg before meals although generally forgetting to take this before lunch and dinner Checking blood sugars infrequently and only in the morning which are not significantly high Again has not received the [place] from patient assistance program and will need to look into this   PLAN:     Discussed that he will benefit from [place] with better blood sugar control, weight management and cardiac benefits He has gained weight and does not exercise More consistent monitoring after meals He will try to take Prandin before each meal and take it with him when eating out  Follow-up in 3 months  There are no Patient Instructions on file for this visit.      Elayne Snare 06/10/2022, 10:11 AM   Note: This office note was prepared with Dragon voice recognition system technology. Any transcriptional errors that result from this process are unintentional.

## 2022-06-10 NOTE — Patient Instructions (Addendum)
Take Repeglanide before each meal  Check blood sugars on waking up 2 days a week  Also check blood sugars about 2 hours after meals and do this after different meals by rotation  Recommended blood sugar levels on waking up are 90-130 and about 2 hours after meal is 130-160  Please bring your blood sugar monitor to each visit, thank you  Walk daily

## 2022-06-11 ENCOUNTER — Other Ambulatory Visit: Payer: Self-pay

## 2022-06-16 LAB — LIPID PANEL
Chol/HDL Ratio: 2.8 ratio (ref 0.0–5.0)
Cholesterol, Total: 116 mg/dL (ref 100–199)
HDL: 42 mg/dL (ref >39)
LDL Chol Calc (NIH): 51 mg/dL (ref 0–99)
Triglycerides: 128 mg/dL (ref 0–149)
VLDL Cholesterol Cal: 23 mg/dL (ref 5–40)

## 2022-06-17 NOTE — Progress Notes (Signed)
Cardiology Office Note:    Date:  06/22/2022   ID:  Joseph, Welch 03-10-1956, MRN [id number]  PCP:  Isaac Bliss, Rayford Halsted, MD   [facility] Providers Cardiologist:  Witt Plitt Martinique, MD     Referring MD: Isaac Bliss, Estel*   Chief Complaint  Patient presents with   Coronary Artery Disease    History of Present Illness:    [name] is a 66 y.o. male with a hx of CAD s/p CABG, HTN, HLD, DM 2, dilated cardiomyopathy and longstanding past noncompliance with follow-up and meds.  Cardiac catheterization in 2006 demonstrated coronary artery disease, however he was not a candidate for further revascularization.  Remote CTA of abdomen showed 50% left renal artery stenosis in 2006.  Follow-up renal artery ultrasound was okay.  Echocardiogram in 2009 showed significant improvement in LV function and EF was normal.  He was admitted in May 2019 with significant hypertension and CHF requiring aggressive diuresis.  Echocardiogram at the time showed EF 50 to 55%.  He has been followed by hematology service for multiple myeloma.  He was diagnosed with a small PE in October 2021 and was on a short course of Eliquis.  Patient was admitted in March 2022 with anorexia and 15 pound weight loss at the recommendation of oncology service.  Creatinine was elevated at 2.81.  CT of abdomen and pelvis revealed severe bilateral hydroureteronephrosis and irregularly thickened lobular urinary bladder with marked irregular mural thickening as well as non-mass-like extension from the bladder dome.  Urology was consulted who recommended continuing Foley catheter until follow-up as outpatient. He underwent cystoscopy and TURP in June 2022.  He was last seen by Dr. Martinique on 04/28/2021 at which point he was stable from the cardiac perspective. He has been diagnosed with prostate cancer.  PET scan obtained in December 2022 showed no evidence of metastatic disease.  He underwent gold seed implant by  urology service in February 2023 and has been receiving radiation therapy.  Patient presents today for follow-up.   Overall, he is doing well without any chest pain or worsening dyspnea.  He has no lower extremity edema, orthopnea or PND.  He is now on steroids again for arthritis. His BP has been consistently high over last several doctor visits. He is walking and plays drums at his church.   Past Medical History:  Diagnosis Date   Anemia    Benign localized prostatic hyperplasia with lower urinary tract symptoms (LUTS)    CAD (coronary artery disease)    cardiologist--- dr Martinique;   11-27-2020 cardiac cath for abnormal cardiolite,  severe 3V disease;   11-30-2000 s/p CABG x4;  cath 04-21-2005 patent SVG-OM1 & SVG --PDA graft's,  occluded radial graft -- OM, mod to sev LV dysfunction,  not candidate for revascularization   Chronic diastolic CHF (congestive heart failure) ([place])    followed by cardiologist   Chronic kidney disease (CKD), stage IV (severe) [facility])    nephrologist--- dr Joelyn Oms   (lov note scanned in [place] , epic, dated 09-26-2020)   Dilated cardiomyopathy ([place])    echo in 2009 showed improvement with a normal EF   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) 05/2021   urologist-- dr eskridge/  oncologist--- dr Alen Blew;  dx 11/ 2022,  Gleason 9, PSA 14   Multiple  myeloma in remission [facility]) 2021   oncologist--- dr Irene Limbo--- dx 2021, IgG Kappa type, completed chemo 2021, in remission since   OA (osteoarthritis)    PAD (peripheral artery disease) ([place])    RA (rheumatoid arthritis) [facility])    rheumatologist--- dr s. Estanislado Pandy---  multiple sites,  no other treatment other than takes tylenol arthritis with flare-ups   Tympanic membrane central perforation, left    followed by dr bates (ent)  chronic since 08/ 2022   (09-03-2021  per pt wife has partially closed)   Type 2 diabetes mellitus [facility])    endocrinologist--- dr a [name]   Uses walker    Wears glasses     Past Surgical History:  Procedure Laterality Date   CARDIAC CATHETERIZATION  11/27/2000   _0  by Dr Martinique;   severe 3V disease,  ef 15-25%   CARDIAC CATHETERIZATION  04/21/2005   by Dr Martinique;    patnet SVG -- OM1 & SVG --PDA grafts,  occluded radial graft --OM1,  atretic LIMA -- Diag,  moderate to severe LV dysfunction,  not candidate for revascularization   CATARACT EXTRACTION W/ INTRAOCULAR LENS IMPLANT Right 08/21/2021   CORONARY ARTERY BYPASS GRAFT  11/30/2000   _1  by Dr Nils Pyle;   LIMA -- Diag,  radial artery -- OM1,  SVG -- OM,  SVG -- RCA   GOLD SEED IMPLANT N/A 09/05/2021   Procedure: GOLD SEED IMPLANT;  Surgeon: Festus Aloe, MD;  [facility];  Service: Urology;  Laterality: N/A;  45 MINS FOR CASE   IR FLUORO GUIDED NEEDLE PLC ASPIRATION/INJECTION LOC  10/24/2019   SPACE OAR INSTILLATION N/A 09/05/2021   Procedure: SPACE OAR INSTILLATION;  Surgeon: Festus Aloe, MD;  [facility]  Service: Urology;  Laterality: N/A;   TRANSURETHRAL RESECTION OF PROSTATE N/A 01/06/2021   Procedure: CYSTOSCOPY WITH CYSTOGRAM/TRANSURETHRAL RESECTION OF THE PROSTATE (TURP);  Surgeon: Lucas Mallow, MD;  Location: [facility];  Service: Urology;  Laterality: N/A;    Current Medications: Current Meds  Medication Sig   Accu-Chek Softclix Lancets  lancets Check blood sugar once a day   amLODipine (NORVASC) 10 MG tablet Take 1 tablet (10 mg total) by mouth daily.   aspirin EC 81 MG tablet Take 81 mg by mouth daily. Swallow whole.   atorvastatin (LIPITOR) 80 MG tablet Take 1 tablet by mouth once daily   Blood Glucose Monitoring Suppl (ACCU-CHEK GUIDE ME) w/Device KIT 1 each by Does not apply route.   carvedilol (COREG) 25 MG tablet Take 1 tablet (25 mg total) by mouth 2 (two) times daily with a meal.   glucose blood (ACCU-CHEK GUIDE) test strip Check blood sugar once a day   hydrALAZINE (APRESOLINE) 50 MG tablet Take 1 tablet (50 mg total) by mouth 3 (three) times daily.   hydroxypropyl methylcellulose / hypromellose (ISOPTO TEARS / GONIOVISC) 2.5 % ophthalmic solution Place 2 drops into the left eye  as needed for dry eyes.   isosorbide mononitrate (IMDUR) 30 MG 24 hr tablet Take 1 tablet by mouth once daily   KLOR-CON M20 20 MEQ tablet Take 1 tablet by mouth once daily   predniSONE (STERAPRED UNI-PAK 21 TAB) 10 MG (21) TBPK tablet Take as directed   repaglinide (PRANDIN) 2 MG tablet TAKE 1 TABLET BY MOUTH THREE TIMES DAILY BEFORE MEAL(S)   [DISCONTINUED] amLODipine (NORVASC) 5 MG tablet Take 1 tablet by mouth once daily     Allergies:   Metformin and related   Social History   Socioeconomic History   Marital status: Married    Spouse name: Not on file   Number of children: 1   Years of education: Not on file   Highest education level: 12th grade  Occupational History   Occupation: Furniture conservator/restorer  Tobacco Use   Smoking status: Never   Smokeless tobacco: Never  Vaping Use   Vaping Use: Never used  Substance and Sexual Activity   Alcohol use: Not Currently   Drug use: Never   Sexual activity: Yes  Other Topics Concern   Not on file  Social History Narrative   ** Merged History Encounter **       Social Determinants of Health   Financial Resource Strain: Low Risk  (05/07/2021)   Overall Financial Resource Strain (CARDIA)     Difficulty of Paying Living Expenses: Not hard at all  Food Insecurity: No Food Insecurity (06/01/2022)   Hunger Vital Sign    Worried About Running Out of Food in the Last Year: Never true    Ran Out of Food in the Last Year: Never true  Transportation Needs: No Transportation Needs (06/01/2022)   PRAPARE - Hydrologist (Medical): No    Lack of Transportation (Non-Medical): No  Physical Activity: Unknown (05/07/2021)   Exercise Vital Sign    Days of Exercise per Week: 0 days    Minutes of Exercise per Session: Not on file  Stress: No Stress Concern Present (05/07/2021)   [place]    Feeling of Stress : Not at all  Social Connections: Unknown (06/01/2022)   Social Connection and Isolation Panel [NHANES]    Frequency of Communication with Friends and Family: Three times a week    Frequency of Social Gatherings with Friends and Family: Once a week    Attends Religious Services: More than 4 times per year    Active Member of [employer] or Organizations: Not on file    Attends Archivist Meetings: Not on file    Marital Status: Married     Family History: The patient's family history includes Diabetes in his mother; Healthy in his daughter; Hypertension in his father.  ROS:   Please see the history of present illness.     All other systems reviewed and are negative.  EKGs/Labs/Other Studies Reviewed:    The following studies were reviewed today:  Echo 03/14/2020  1. Left ventricular ejection fraction, by estimation, is 55 to 60%. The  left ventricle has normal function. The left ventricle has no regional  wall motion abnormalities. Left ventricular diastolic parameters were  normal.   2. Right ventricular systolic function is normal. The right ventricular  size is normal.   3. Left atrial size was mildly dilated.   4. The mitral valve is normal in structure. Trivial mitral  valve  regurgitation. No evidence of mitral stenosis.   5. The  aortic valve is normal in structure. Aortic valve regurgitation is  trivial. No aortic stenosis is present.   6. The inferior vena cava is normal in size with greater than 50%  respiratory variability, suggesting right atrial pressure of 3 mmHg.   EKG:  EKG is ordered today.  The ekg ordered today demonstrates normal sinus rhythm, no significant ST-T wave changes.  Recent Labs: 09/05/2021: Hemoglobin 12.9 06/08/2022: BUN 23; Creatinine, Ser 1.34; Potassium 3.5; Sodium 139  Recent Lipid Panel    Component Value Date/Time   CHOL 116 06/16/2022 0917   TRIG 128 06/16/2022 0917   HDL 42 06/16/2022 0917   CHOLHDL 2.8 06/16/2022 0917   CHOLHDL 2 07/21/2018 1507   VLDL 13.0 07/21/2018 1507   LDLCALC 51 06/16/2022 0917   LDLDIRECT 50.0 06/08/2022 1011     Risk Assessment/Calculations:           Physical Exam:    VS:  BP (!) 150/70 (BP Location: Left Arm, Cuff Size: Large)   Pulse 76   Ht 5' 9.5" (1.765 m)   Wt 196 lb 6.4 oz (89.1 kg)   SpO2 100%   BMI 28.59 kg/m     Wt Readings from Last 3 Encounters:  06/22/22 196 lb 6.4 oz (89.1 kg)  06/10/22 198 lb (89.8 kg)  06/03/22 195 lb 11.2 oz (88.8 kg)     GEN:  Well nourished, well developed in no acute distress HEENT: Normal NECK: No JVD; No carotid bruits LYMPHATICS: No lymphadenopathy CARDIAC: RRR, no murmurs, rubs, gallops RESPIRATORY:  Clear to auscultation without rales, wheezing or rhonchi  ABDOMEN: Soft, non-tender, non-distended MUSCULOSKELETAL:  No edema; No deformity  SKIN: Warm and dry NEUROLOGIC:  Alert and oriented x 3 PSYCHIATRIC:  Normal affect   HYPERTENSION CONTROL Vitals:   06/22/22 1000 06/22/22 1015  BP: (!) 172/82 (!) 150/70    The patient's blood pressure is elevated above target today.  In order to address the patient's elevated BP: A current anti-hypertensive medication was adjusted today.       ASSESSMENT:    1. Coronary  artery disease involving coronary bypass graft of native heart without angina pectoris   2. Hyperlipidemia LDL goal <70   3. Essential hypertension   4. CKD (chronic kidney disease) stage 4, GFR 15-29 ml/min (HCC)     PLAN:    In order of problems listed above:  CAD s/p CABG: Denies any recent chest pain or worsening dyspnea.  On aspirin and Lipitor  Hyperlipidemia: On Lipitor 80 mg daily. LDL is at goal 50  Hypertension: Blood pressure is elevated. Recommend increase in amlodipine to 10 mg daily.   DM2: Managed by primary care provider  History of multiple myeloma: In remission.  Followed by hematology/oncology service.  6. RA now on steroids.            Medication Adjustments/Labs and Tests Ordered: Current medicines are reviewed at length with the patient today.  Concerns regarding medicines are outlined above.  No orders of the defined types were placed in this encounter.  Meds ordered this encounter  Medications   amLODipine (NORVASC) 10 MG tablet    Sig: Take 1 tablet (10 mg total) by mouth daily.    Dispense:  90 tablet    Refill:  3    There are no Patient Instructions on file for this visit.   Signed, Raneshia Derick Martinique, MD  06/22/2022 10:21 AM    [place]

## 2022-06-22 ENCOUNTER — Encounter: Payer: Self-pay | Admitting: Cardiology

## 2022-06-22 ENCOUNTER — Ambulatory Visit: Payer: Medicare Other | Attending: Cardiology | Admitting: Cardiology

## 2022-06-22 VITALS — BP 150/70 | HR 76 | Ht 69.5 in | Wt 196.4 lb

## 2022-06-22 DIAGNOSIS — N184 Chronic kidney disease, stage 4 (severe): Secondary | ICD-10-CM | POA: Diagnosis not present

## 2022-06-22 DIAGNOSIS — E785 Hyperlipidemia, unspecified: Secondary | ICD-10-CM | POA: Insufficient documentation

## 2022-06-22 DIAGNOSIS — I10 Essential (primary) hypertension: Secondary | ICD-10-CM | POA: Diagnosis not present

## 2022-06-22 DIAGNOSIS — I25810 Atherosclerosis of coronary artery bypass graft(s) without angina pectoris: Secondary | ICD-10-CM | POA: Diagnosis not present

## 2022-06-22 MED ORDER — AMLODIPINE BESYLATE 10 MG PO TABS: 10.0000 mg | ORAL_TABLET | Freq: Every day | ORAL | 3 refills | Status: DC

## 2022-06-22 NOTE — Patient Instructions (Signed)
Medication Instructions:  Increase Amlodipine to 10 mg daily Continue all other medications *If you need a refill on your cardiac medications before your next appointment, please call your pharmacy*   Lab Work: None ordered   Testing/Procedures: None ordered   Follow-Up: At [facility], you and your health needs are our priority.  As part of our continuing mission to provide you with exceptional heart care, we have created designated Provider Care Teams.  These Care Teams include your primary Cardiologist (physician) and Advanced Practice Providers (APPs -  Physician Assistants and Nurse Practitioners) who all work together to provide you with the care you need, when you need it.  We recommend signing up for the patient portal called "MyChart".  Sign up information is provided on this After Visit Summary.  MyChart is used to connect with patients for Virtual Visits (Telemedicine).  Patients are able to view lab/test results, encounter notes, upcoming appointments, etc.  Non-urgent messages can be sent to your provider as well.   To learn more about what you can do with MyChart, go to [email or web address].    Your next appointment:  1 year   Call in August to schedule Dec appointment     The format for your next appointment: Office     Provider:  Dr.Jordan   Important Information About Sugar

## 2022-07-23 ENCOUNTER — Other Ambulatory Visit: Payer: Self-pay | Admitting: Cardiology

## 2022-07-31 ENCOUNTER — Other Ambulatory Visit: Payer: Self-pay | Admitting: Cardiology

## 2022-08-04 ENCOUNTER — Other Ambulatory Visit: Payer: Self-pay | Admitting: Endocrinology

## 2022-08-13 ENCOUNTER — Other Ambulatory Visit: Payer: Self-pay | Admitting: Endocrinology

## 2022-08-13 DIAGNOSIS — E1165 Type 2 diabetes mellitus with hyperglycemia: Secondary | ICD-10-CM

## 2022-09-02 ENCOUNTER — Other Ambulatory Visit: Payer: Self-pay | Admitting: Endocrinology

## 2022-09-03 ENCOUNTER — Encounter: Payer: Self-pay | Admitting: Internal Medicine

## 2022-09-03 ENCOUNTER — Ambulatory Visit (INDEPENDENT_AMBULATORY_CARE_PROVIDER_SITE_OTHER): Payer: Medicare Other | Admitting: Internal Medicine

## 2022-09-03 VITALS — BP 168/84 | HR 58 | Temp 98.4°F | Wt 190.9 lb

## 2022-09-03 DIAGNOSIS — E1165 Type 2 diabetes mellitus with hyperglycemia: Secondary | ICD-10-CM

## 2022-09-03 DIAGNOSIS — C61 Malignant neoplasm of prostate: Secondary | ICD-10-CM

## 2022-09-03 DIAGNOSIS — M1990 Unspecified osteoarthritis, unspecified site: Secondary | ICD-10-CM | POA: Diagnosis not present

## 2022-09-03 DIAGNOSIS — E7800 Pure hypercholesterolemia, unspecified: Secondary | ICD-10-CM

## 2022-09-03 DIAGNOSIS — M069 Rheumatoid arthritis, unspecified: Secondary | ICD-10-CM

## 2022-09-03 DIAGNOSIS — I10 Essential (primary) hypertension: Secondary | ICD-10-CM | POA: Diagnosis not present

## 2022-09-03 DIAGNOSIS — N1831 Chronic kidney disease, stage 3a: Secondary | ICD-10-CM

## 2022-09-03 LAB — POCT GLYCOSYLATED HEMOGLOBIN (HGB A1C): Hemoglobin A1C: 9.7 % — AB (ref 4.0–5.6)

## 2022-09-03 NOTE — Progress Notes (Signed)
Established Patient Office Visit     CC/Reason for Visit: Follow-up chronic medical conditions  HPI: [name] is a 67 y.o. male who is coming in today for the above mentioned reasons. Past Medical History is significant for: Hypertension, hyperlipidemia, uncontrolled type 2 diabetes, rheumatoid arthritis, prior history of prostate cancer.  Since I last saw him he has had 2 courses of prednisone by rheumatology.  He is currently on 5 mg.  He has been seen by cardiology who increased his amlodipine from 5 to 10 mg.  Blood pressure and diabetes are both quite uncontrolled.  He states that when he tapers off prednisone he has recurrence of joint pain.  He has no concerns today.   Past Medical/Surgical History: Past Medical History:  Diagnosis Date   Anemia    Benign localized prostatic hyperplasia with lower urinary tract symptoms (LUTS)    CAD (coronary artery disease)    cardiologist--- dr Martinique;   11-27-2020 cardiac cath for abnormal cardiolite,  severe 3V disease;   11-30-2000 s/p CABG x4;  cath 04-21-2005 patent SVG-OM1 & SVG --PDA graft's,  occluded radial graft -- OM, mod to sev LV dysfunction,  not candidate for revascularization   Chronic diastolic CHF (congestive heart failure) ([place])    followed by cardiologist   Chronic kidney disease (CKD), stage IV (severe) ([place])    nephrologist--- dr Joelyn Oms   (lov note scanned in [place] , epic, dated 09-26-2020)   Dilated cardiomyopathy ([place])    echo in 2009 showed improvement with a normal EF   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 03-08-2021   (09-03-2021  per pt wife is resolving)   History of COVID-19    spring 2022 with mild symptoms that resolved   History of pulmonary embolism 03/13/2020   complete blood thinner   History of sepsis    hx admission for severe sepsis twice;  admitted 03-13-2020 secondary to aspiration pneumonia w/ ARF and PE;   admitted 10-07-2020 secondary to MSSA uti    History of stroke    per imaging 05-07-2021 head CT ,  remote infarcts left thalamus, basal ganglia   HTN (hypertension)    Hyperlipemia    Ischemic heart disease    cardiologist--- dr Martinique;   Malignant neoplasm prostate [facility]) 05/2021   urologist-- dr eskridge/  oncologist--- dr Alen Blew;  dx 11/ 2022,  Gleason 9, PSA 14   Multiple myeloma in remission [facility]) 2021   oncologist--- dr Irene Limbo--- dx 2021, IgG Kappa type, completed chemo 2021, in remission since   OA (osteoarthritis)    PAD (peripheral artery disease) ([place])    RA (rheumatoid arthritis) [facility])    rheumatologist--- dr s. Estanislado Pandy---  multiple sites,  no other treatment other than takes tylenol arthritis with flare-ups   Tympanic membrane central perforation, left    followed by dr bates (ent)  chronic since 08/ 2022   (09-03-2021  per pt wife has partially closed)   Type 2 diabetes mellitus [facility])    endocrinologist--- dr a [name]   Uses walker    Wears glasses     Past Surgical History:  Procedure Laterality Date   CARDIAC CATHETERIZATION  11/27/2000   @[facility]  by Dr Martinique;   severe 3V disease,  ef 15-25%   CARDIAC CATHETERIZATION  04/21/2005   by Dr Martinique;    patnet SVG -- OM1 & SVG --PDA grafts,  occluded radial graft --OM1,  atretic LIMA -- Diag,  moderate to severe LV dysfunction,  not candidate for revascularization   CATARACT EXTRACTION W/ INTRAOCULAR LENS IMPLANT Right 08/21/2021   CORONARY ARTERY BYPASS GRAFT  11/30/2000   @[facility]  by Dr Nils Pyle;   LIMA -- Diag,  radial artery -- OM1,  SVG -- OM,  SVG -- RCA   GOLD SEED IMPLANT N/A 09/05/2021   Procedure: GOLD SEED IMPLANT;  Surgeon: Festus Aloe, MD;  Location: [facility];  Service: Urology;  Laterality: N/A;  45 MINS FOR CASE   IR FLUORO GUIDED NEEDLE PLC ASPIRATION/INJECTION LOC  10/24/2019   SPACE OAR INSTILLATION N/A 09/05/2021   Procedure: SPACE OAR INSTILLATION;  Surgeon: Festus Aloe, MD;  Location: [facility];  Service:  Urology;  Laterality: N/A;   TRANSURETHRAL RESECTION OF PROSTATE N/A 01/06/2021   Procedure: CYSTOSCOPY WITH CYSTOGRAM/TRANSURETHRAL RESECTION OF THE PROSTATE (TURP);  Surgeon: Lucas Mallow, MD;  Location[facility];  Service: Urology;  Laterality: N/A;    Social History:  reports that he has never smoked. He has never used smokeless tobacco. He reports that he does not currently use alcohol. He reports that he does not use drugs.  Allergies: Allergies  Allergen Reactions   Metformin And Related Diarrhea    Family History:  Family History  Problem Relation Age of Onset   Hypertension Father    Diabetes Mother        DIABETIC COMA   Healthy Daughter      Current Outpatient Medications:    Accu-Chek Softclix Lancets lancets, USE ACCU CHEK SOFTCLIX LANCETS TO CHECK BLOOD SUGAR FASTING OR 2 HOURS AFTER A MEAL EVERY OTHER DAY., Disp: 100 each, Rfl: 3   amLODipine (NORVASC) 10 MG tablet, Take 1 tablet (10 mg total) by mouth daily., Disp: 90 tablet, Rfl: 3   aspirin EC 81 MG tablet, Take 81 mg by mouth daily. Swallow whole., Disp: , Rfl:    atorvastatin (LIPITOR) 80 MG tablet, Take 1 tablet by mouth once daily, Disp: 90 tablet, Rfl: 3   Blood Glucose Monitoring Suppl (ACCU-CHEK GUIDE ME) w/Device KIT, 1 each by Does not apply route., Disp: , Rfl:    carvedilol (COREG) 25 MG tablet, TAKE 1 TABLET BY MOUTH TWICE DAILY WITH A MEAL, Disp: 180 tablet, Rfl: 2   glucose blood (ACCU-CHEK GUIDE) test strip, Check blood sugar once a day, Disp: 100 each, Rfl: 2   hydrALAZINE (APRESOLINE) 50 MG tablet, Take 1 tablet (50 mg total) by mouth 3 (three) times daily., Disp: 270 tablet, Rfl: 3   hydroxypropyl methylcellulose / hypromellose (ISOPTO TEARS / GONIOVISC) 2.5 % ophthalmic solution, Place 2 drops into the left eye as needed for dry eyes., Disp: 15 mL, Rfl: 2   isosorbide mononitrate (IMDUR) 30 MG 24 hr tablet, Take 1 tablet (30 mg total) by mouth daily., Disp: 90 tablet, Rfl: 3   KLOR-CON M20  20 MEQ tablet, Take 1 tablet by mouth once daily, Disp: 30 tablet, Rfl: 0   predniSONE (STERAPRED UNI-PAK 21 TAB) 10 MG (21) TBPK tablet, Take as directed, Disp: 21 tablet, Rfl: 0   repaglinide (PRANDIN) 2 MG tablet, TAKE 1 TABLET BY MOUTH THREE TIMES DAILY BEFORE MEAL(S), Disp: 180 tablet, Rfl: 2 No current facility-administered medications for this visit.  Facility-Administered Medications Ordered in Other Visits:    sodium chloride flush (NS) 0.9 % injection 10 mL, 10 mL, Intravenous, PRN, Irene Limbo, Cloria Spring, MD   sodium chloride flush (NS) 0.9 % injection 10 mL, 10 mL, Intracatheter, Once  PRN, Brunetta Genera, MD  Review of Systems:  Negative unless indicated in HPI.   Physical Exam: Vitals:   09/03/22 1014 09/03/22 1016  BP: (!) 168/84 (!) 168/84  Pulse: (!) 58   Temp: 98.4 F (36.9 C)   TempSrc: Oral   SpO2: 98%   Weight: 190 lb 14.4 oz (86.6 kg)     Body mass index is 27.79 kg/m.   Physical Exam Vitals reviewed.  Constitutional:      Appearance: Normal appearance.  HENT:     Head: Normocephalic and atraumatic.  Eyes:     Conjunctiva/sclera: Conjunctivae normal.     Pupils: Pupils are equal, round, and reactive to light.  Cardiovascular:     Rate and Rhythm: Normal rate and regular rhythm.  Pulmonary:     Effort: Pulmonary effort is normal.     Breath sounds: Normal breath sounds.  Skin:    General: Skin is warm and dry.  Neurological:     General: No focal deficit present.     Mental Status: He is alert and oriented to person, place, and time.  Psychiatric:        Mood and Affect: Mood normal.        Behavior: Behavior normal.        Thought Content: Thought content normal.        Judgment: Judgment normal.      Impression and Plan:  Uncontrolled type 2 diabetes mellitus with hyperglycemia (HCC) - Plan: POC HgB A1c, Urine microalbumin-creatinine with uACR  Inflammatory arthritis  Primary hypertension  Pure  hypercholesterolemia  Rheumatoid arthritis involving multiple sites, unspecified whether rheumatoid factor present (HCC)  Prostate cancer ([place] [address])  Chronic renal impairment, stage 3a (HCC)  -Blood pressure and diabetes both remain uncontrolled.  Suspect ongoing prednisone has a lot to do with this. -His ambulatory blood pressure measurements are slightly better: 145/75, 132/80, 129/76.  No blood pressure changes today. -His A1c is quite elevated at 9.7.  He has follow-up with his endocrinologist for next week.  Time spent:32 minutes reviewing chart, interviewing and examining patient and formulating plan of care.     Lelon Frohlich, MD [facility]

## 2022-09-04 ENCOUNTER — Other Ambulatory Visit: Payer: Self-pay

## 2022-09-07 ENCOUNTER — Other Ambulatory Visit (INDEPENDENT_AMBULATORY_CARE_PROVIDER_SITE_OTHER): Payer: Medicare Other

## 2022-09-07 DIAGNOSIS — E1165 Type 2 diabetes mellitus with hyperglycemia: Secondary | ICD-10-CM | POA: Diagnosis not present

## 2022-09-07 LAB — BASIC METABOLIC PANEL
BUN: 26 mg/dL — ABNORMAL HIGH (ref 6–23)
CO2: 24 mEq/L (ref 19–32)
Calcium: 9.3 mg/dL (ref 8.4–10.5)
Chloride: 105 mEq/L (ref 96–112)
Creatinine, Ser: 1.49 mg/dL (ref 0.40–1.50)
GFR: 48.37 mL/min — ABNORMAL LOW (ref >60.00)
Glucose, Bld: 168 mg/dL — ABNORMAL HIGH (ref 70–99)
Potassium: 3.8 mEq/L (ref 3.5–5.1)
Sodium: 139 mEq/L (ref 135–145)

## 2022-09-07 LAB — HEMOGLOBIN A1C: Hgb A1c MFr Bld: 10.5 % — ABNORMAL HIGH (ref 4.6–6.5)

## 2022-09-10 ENCOUNTER — Ambulatory Visit (INDEPENDENT_AMBULATORY_CARE_PROVIDER_SITE_OTHER): Payer: Medicare Other | Admitting: Endocrinology

## 2022-09-10 ENCOUNTER — Encounter: Payer: Self-pay | Admitting: Endocrinology

## 2022-09-10 ENCOUNTER — Telehealth: Payer: Self-pay

## 2022-09-10 VITALS — BP 134/80 | HR 71 | Ht 69.5 in | Wt 189.0 lb

## 2022-09-10 DIAGNOSIS — E1165 Type 2 diabetes mellitus with hyperglycemia: Secondary | ICD-10-CM | POA: Diagnosis not present

## 2022-09-10 MED ORDER — EMPAGLIFLOZIN 25 MG PO TABS: 25.0000 mg | ORAL_TABLET | Freq: Every day | ORAL | 2 refills | Status: DC

## 2022-09-10 NOTE — Patient Instructions (Signed)
Check blood sugars on waking up 2 days a week  Also check blood sugars about 2 hours after meals and do this after different meals by rotation  Recommended blood sugar levels on waking up are 90-130 and about 2 hours after meal is 130-180  Please bring your blood sugar monitor to each visit, thank you

## 2022-09-10 NOTE — Progress Notes (Signed)
Patient ID: [name], male   DOB: 06-Dec-1955, 67 y.o.   MRN: [id number]           Reason for Appointment: Follow-up for Type 2 Diabetes   History of Present Illness:          Date of diagnosis of type 2 diabetes mellitus:   2018      Background history:  He has never been told to have diabetes but review of his previous labs indicate that he had high blood sugar 179 in 2014 His highest sugar has been 234 in 10/2016 when his A1c was 8.8, not clear if he was symptomatic at that time  Recent history:   A1c is 10.5, previously in November was 7.4   7.4 compared to 6.6   Non-insulin hypoglycemic drugs the patient is taking are:   Prandin 2 mg at meals  Current management, blood sugar patterns and problems identified:  He apparently has been on prednisone since December from his rheumatologist, details not available, also previously got a Dosepak Currently taking 10 mg daily He says he is taking his Prandin regularly before starting to eat up to 3 times a day However his blood sugar monitoring has been inconsistent and mostly checking fasting readings Not clear why his fasting glucose fluctuated between 78 and 220 Previously had been advised to get Farxiga from the patient assistance program but still has not received this drug Weight is the same Not able to start walking for exercise, does have some joint pains           Glucose monitoring:   has Accu-Chek guide meter  Blood sugar for meter review:  PRE-MEAL Fasting Lunch Dinner Bedtime Overall  Glucose range: 78-220      Mean/median:     182, 30-day   POST-MEAL PC Breakfast PC Lunch PC Dinner  Glucose range: 180 205   Mean/median:        Prior: [id number]   Dietician visit, most recent: 2/21  Weight history:  Wt Readings from Last 3 Encounters:  09/10/22 189 lb (85.7 kg)  09/03/22 190 lb 14.4 oz (86.6 kg)  06/22/22 196 lb 6.4 oz (89.1 kg)    Glycemic control:   Lab Results  Component Value Date    HGBA1C 10.5 (H) 09/07/2022   HGBA1C 9.7 (A) 09/03/2022   HGBA1C 7.4 (H) 06/08/2022   Lab Results  Component Value Date   MICROALBUR 12.6 (H) 07/21/2018   LDLCALC 51 06/16/2022   CREATININE 1.49 09/07/2022   Lab Results  Component Value Date   MICRALBCREAT 10.0 07/21/2018    Lab Results  Component Value Date   FRUCTOSAMINE 297 (H) 07/22/2021   FRUCTOSAMINE 347 (H) 04/14/2021   FRUCTOSAMINE 237 11/08/2020   Lab Results  Component Value Date   HGB 12.9 (L) 09/05/2021    Lab on 09/07/2022  Component Date Value Ref Range Status   Sodium 09/07/2022 139  135 - 145 mEq/L Final   Potassium 09/07/2022 3.8  3.5 - 5.1 mEq/L Final   Chloride 09/07/2022 105  96 - 112 mEq/L Final   CO2 09/07/2022 24  19 - 32 mEq/L Final   Glucose, Bld 09/07/2022 168 (H)  70 - 99 mg/dL Final   BUN 09/07/2022 26 (H)  6 - 23 mg/dL Final   Creatinine, Ser 09/07/2022 1.49  0.40 - 1.50 mg/dL Final   GFR 09/07/2022 48.37 (L)  >60.00 mL/min Final   Calculated using the CKD-EPI Creatinine Equation (2021)   Calcium 09/07/2022 9.3  8.4 - 10.5 mg/dL Final   Hgb A1c MFr Bld 09/07/2022 10.5 (H)  4.6 - 6.5 % Final   Glycemic Control Guidelines for People with Diabetes:Non Diabetic:  <6%Goal of Therapy: <7%Additional Action Suggested:  >8%     Allergies as of 09/10/2022       Reactions   Metformin And Related Diarrhea        Medication List        Accurate as of September 10, 2022 10:35 AM. If you have any questions, ask your nurse or doctor.          Accu-Chek Guide Me w/Device Kit 1 each by Does not apply route.   Accu-Chek Guide test strip Generic drug: glucose blood Check blood sugar once a day   Accu-Chek Softclix Lancets lancets USE ACCU CHEK SOFTCLIX LANCETS TO CHECK BLOOD SUGAR FASTING OR 2 HOURS AFTER A MEAL EVERY OTHER DAY.   allopurinol 100 MG tablet Commonly known as: ZYLOPRIM Take 100 mg by mouth daily.   amLODipine 10 MG tablet Commonly known as: NORVASC Take 1 tablet (10  mg total) by mouth daily.   aspirin EC 81 MG tablet Take 81 mg by mouth daily. Swallow whole.   atorvastatin 80 MG tablet Commonly known as: LIPITOR Take 1 tablet by mouth once daily   carvedilol 25 MG tablet Commonly known as: COREG TAKE 1 TABLET BY MOUTH TWICE DAILY WITH A MEAL   hydrALAZINE 50 MG tablet Commonly known as: APRESOLINE Take 1 tablet (50 mg total) by mouth 3 (three) times daily.   hydroxypropyl methylcellulose / hypromellose 2.5 % ophthalmic solution Commonly known as: ISOPTO TEARS / GONIOVISC Place 2 drops into the left eye as needed for dry eyes.   isosorbide mononitrate 30 MG 24 hr tablet Commonly known as: IMDUR Take 1 tablet (30 mg total) by mouth daily.   Klor-Con M20 20 MEQ tablet Generic drug: potassium chloride SA Take 1 tablet by mouth once daily   predniSONE 10 MG (21) Tbpk tablet Commonly known as: STERAPRED UNI-PAK 21 TAB Take as directed   repaglinide 2 MG tablet Commonly known as: PRANDIN TAKE 1 TABLET BY MOUTH THREE TIMES DAILY BEFORE MEAL(S)        Allergies:  Allergies  Allergen Reactions   Metformin And Related Diarrhea    Past Medical History:  Diagnosis Date   Anemia    Benign localized prostatic hyperplasia with lower urinary tract symptoms (LUTS)    CAD (coronary artery disease)    cardiologist--- dr Martinique;   11-27-2020 cardiac cath for abnormal cardiolite,  severe 3V disease;   11-30-2000 s/p CABG x4;  cath 04-21-2005 patent SVG-OM1 & SVG --PDA graft's,  occluded radial graft -- OM, mod to sev LV dysfunction,  not candidate for revascularization   Chronic diastolic CHF (congestive heart failure) ([place])    followed by cardiologist   Chronic kidney disease (CKD), stage IV (severe) [facility])    nephrologist--- dr Joelyn Oms   (lov note scanned in [place] , epic, dated 09-26-2020)   Dilated cardiomyopathy ([place])    echo in 2009 showed improvement with a normal EF   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  03-08-2021   (09-03-2021  per pt wife is resolving)   History of COVID-19    spring 2022 with mild symptoms that resolved   History of pulmonary embolism 03/13/2020   complete blood thinner   History of sepsis    hx admission for severe sepsis twice;  admitted 03-13-2020 secondary to aspiration pneumonia w/ ARF and PE;   admitted 10-07-2020 secondary to MSSA uti   History of stroke    per imaging 05-07-2021 head CT ,  remote infarcts left thalamus, basal ganglia   HTN (hypertension)    Hyperlipemia    Ischemic heart disease    cardiologist--- dr Martinique;   Malignant neoplasm prostate [facility]) 05/2021   urologist-- dr eskridge/  oncologist--- dr Alen Blew;  dx 11/ 2022,  Gleason 9, PSA 14   Multiple myeloma in remission [facility]) 2021   oncologist--- dr Irene Limbo--- dx 2021, IgG Kappa type, completed chemo 2021, in remission since   OA (osteoarthritis)    PAD (peripheral artery disease) ([place])    RA (rheumatoid arthritis) [facility])    rheumatologist--- dr s. Estanislado Pandy---  multiple sites,  no other treatment other than takes tylenol arthritis with flare-ups   Tympanic membrane central perforation, left    followed by dr bates (ent)  chronic since 08/ 2022   (09-03-2021  per pt wife has partially closed)   Type 2 diabetes mellitus [facility])    endocrinologist--- dr a [name]   Uses walker    Wears glasses     Past Surgical History:  Procedure Laterality Date   CARDIAC CATHETERIZATION  11/27/2000   '@[facility]'$  by Dr Martinique;   severe 3V disease,  ef 15-25%   CARDIAC CATHETERIZATION  04/21/2005   by Dr Martinique;    patnet SVG -- OM1 & SVG --PDA grafts,  occluded radial graft --OM1,  atretic LIMA -- Diag,  moderate to severe LV dysfunction,  not candidate for revascularization   CATARACT EXTRACTION W/ INTRAOCULAR LENS IMPLANT Right 08/21/2021   CORONARY ARTERY BYPASS GRAFT  11/30/2000   '@[facility]'$  by Dr Nils Pyle;   LIMA -- Diag,  radial artery -- OM1,  SVG -- OM,  SVG -- RCA   GOLD SEED IMPLANT N/A 09/05/2021   Procedure: GOLD  SEED IMPLANT;  Surgeon: Festus Aloe, MD;  Location: [facility];  Service: Urology;  Laterality: N/A;  45 MINS FOR CASE   IR FLUORO GUIDED NEEDLE PLC ASPIRATION/INJECTION LOC  10/24/2019   SPACE OAR INSTILLATION N/A 09/05/2021   Procedure: SPACE OAR INSTILLATION;  Surgeon: Festus Aloe, MD;  Location: [facility];  Service: Urology;  Laterality: N/A;   TRANSURETHRAL RESECTION OF PROSTATE N/A 01/06/2021   Procedure: CYSTOSCOPY WITH CYSTOGRAM/TRANSURETHRAL RESECTION OF THE PROSTATE (TURP);  Surgeon: Lucas Mallow, MD;  Location: [facility];  Service: Urology;  Laterality: N/A;    Family History  Problem Relation Age of Onset   Hypertension Father    Diabetes Mother        DIABETIC COMA   Healthy Daughter     Social History:  reports that he has never smoked. He has never used smokeless tobacco. He reports that he does not currently use alcohol. He reports that he does not use drugs.   Review of Systems   Lipid history: On atorvastatin 80 mg from his cardiologist since diagnosis of CAD, labs as follows    Lab Results  Component Value Date   CHOL 116 06/16/2022   HDL 42 06/16/2022   LDLCALC 51 06/16/2022   LDLDIRECT 50.0 06/08/2022   TRIG 128 06/16/2022   CHOLHDL 2.8 06/16/2022           Hypertension: Has been followed by cardiologist for blood pressure management Blood pressure is again high  Aldosterone level has been checked for his history of hypokalemia and was  normal  Recent readings:  BP Readings from Last 3 Encounters:  09/10/22 134/80  09/03/22 (!) 168/84  06/22/22 (!) 150/70   RENAL dysfunction:  Renal dysfunction: not related to diabetes Recently stable values No history of microalbuminuria  Lab Results  Component Value Date   CREATININE 1.49 09/07/2022   CREATININE 1.34 06/08/2022   CREATININE 1.43 01/23/2022   Lab Results  Component Value Date   K 3.8 09/07/2022    Most recent eye exam was in  9/22  Most recent foot exam: 4/23   Physical Examination:  BP 134/80 (BP Location: Left Arm, Patient Position: Sitting, Cuff Size: Large)   Pulse 71   Ht 5' 9.5" (1.765 m)   Wt 189 lb (85.7 kg)   SpO2 97%   BMI 27.51 kg/m          ASSESSMENT:  Diabetes type 2, mild  See history of present illness for detailed discussion of current diabetes management, blood sugar patterns and problems identified  A1c is 10.5  Currently treated with Prandin only  His blood sugars are totally out of control because of being on prednisone since December Although he should be managed with insulin he is very reluctant to do this Also thinks that he may be able to stop his prednisone soon Checking blood sugars periodically and mostly in the morning with variable results Likely has higher readings postprandially from prednisone during the day Again has not received the [place] from patient assistance program for unknown reasons   PLAN:     He agrees to take a trial of Jardiance 10 mg daily with samples for the next 2 weeks Subsequently will go to 25 mg, prescription sent to see if it is approved In the meantime we will try to get him approved for Farxiga patient assistance If blood sugars stay consistently high Will need short-term follow-up to make sure he does not need to be switched to insulin He will start monitoring after meals and less in the morning He will need to take Prandin before each meal consistently May also consider adding metformin  There are no Patient Instructions on file for this visit.      Elayne Snare 09/10/2022, 10:35 AM   Note: This office note was prepared with Dragon voice recognition system technology. Any transcriptional errors that result from this process are unintentional.

## 2022-09-10 NOTE — Telephone Encounter (Signed)
Follow up with patient regarding patient assistance.

## 2022-10-05 ENCOUNTER — Other Ambulatory Visit: Payer: Self-pay | Admitting: Internal Medicine

## 2022-10-05 DIAGNOSIS — M069 Rheumatoid arthritis, unspecified: Secondary | ICD-10-CM

## 2022-10-05 DIAGNOSIS — I10 Essential (primary) hypertension: Secondary | ICD-10-CM

## 2022-10-05 DIAGNOSIS — E1165 Type 2 diabetes mellitus with hyperglycemia: Secondary | ICD-10-CM

## 2022-10-05 DIAGNOSIS — C9000 Multiple myeloma not having achieved remission: Secondary | ICD-10-CM

## 2022-10-05 DIAGNOSIS — C61 Malignant neoplasm of prostate: Secondary | ICD-10-CM

## 2022-10-05 DIAGNOSIS — E7800 Pure hypercholesterolemia, unspecified: Secondary | ICD-10-CM

## 2022-10-05 DIAGNOSIS — M1990 Unspecified osteoarthritis, unspecified site: Secondary | ICD-10-CM

## 2022-10-05 DIAGNOSIS — I739 Peripheral vascular disease, unspecified: Secondary | ICD-10-CM

## 2022-10-05 DIAGNOSIS — I420 Dilated cardiomyopathy: Secondary | ICD-10-CM

## 2022-10-05 DIAGNOSIS — I2510 Atherosclerotic heart disease of native coronary artery without angina pectoris: Secondary | ICD-10-CM

## 2022-10-07 ENCOUNTER — Other Ambulatory Visit: Payer: Self-pay | Admitting: Endocrinology

## 2022-10-13 ENCOUNTER — Other Ambulatory Visit: Payer: Self-pay | Admitting: Endocrinology

## 2022-11-09 ENCOUNTER — Encounter: Payer: Self-pay | Admitting: Hematology

## 2022-11-09 ENCOUNTER — Other Ambulatory Visit (INDEPENDENT_AMBULATORY_CARE_PROVIDER_SITE_OTHER): Payer: Medicare Other

## 2022-11-09 DIAGNOSIS — E1165 Type 2 diabetes mellitus with hyperglycemia: Secondary | ICD-10-CM | POA: Diagnosis not present

## 2022-11-09 LAB — BASIC METABOLIC PANEL
BUN: 24 mg/dL — ABNORMAL HIGH (ref 6–23)
CO2: 24 mEq/L (ref 19–32)
Calcium: 8.9 mg/dL (ref 8.4–10.5)
Chloride: 104 mEq/L (ref 96–112)
Creatinine, Ser: 1.34 mg/dL (ref 0.40–1.50)
GFR: 54.87 mL/min — ABNORMAL LOW (ref >60.00)
Glucose, Bld: 236 mg/dL — ABNORMAL HIGH (ref 70–99)
Potassium: 3.8 mEq/L (ref 3.5–5.1)
Sodium: 137 mEq/L (ref 135–145)

## 2022-11-09 LAB — HEMOGLOBIN A1C: Hgb A1c MFr Bld: 9.1 % — ABNORMAL HIGH (ref 4.6–6.5)

## 2022-11-12 ENCOUNTER — Encounter: Payer: Self-pay | Admitting: Endocrinology

## 2022-11-12 ENCOUNTER — Ambulatory Visit (INDEPENDENT_AMBULATORY_CARE_PROVIDER_SITE_OTHER): Payer: Medicare Other | Admitting: Endocrinology

## 2022-11-12 VITALS — BP 132/78 | HR 69 | Ht 69.5 in | Wt 183.0 lb

## 2022-11-12 DIAGNOSIS — Z7984 Long term (current) use of oral hypoglycemic drugs: Secondary | ICD-10-CM

## 2022-11-12 DIAGNOSIS — N1831 Chronic kidney disease, stage 3a: Secondary | ICD-10-CM

## 2022-11-12 DIAGNOSIS — E1165 Type 2 diabetes mellitus with hyperglycemia: Secondary | ICD-10-CM

## 2022-11-12 DIAGNOSIS — Z794 Long term (current) use of insulin: Secondary | ICD-10-CM

## 2022-11-12 MED ORDER — INSULIN PEN NEEDLE 31G X 5 MM MISC: 1 refills | Status: AC

## 2022-11-12 MED ORDER — NOVOLOG MIX 70/30 FLEXPEN (70-30) 100 UNIT/ML ~~LOC~~ SUPN: PEN_INJECTOR | SUBCUTANEOUS | 1 refills | Status: DC

## 2022-11-12 NOTE — Progress Notes (Signed)
Patient ID: [name], male   DOB: July 21, 1955, 67 y.o.   MRN: [id number]           Reason for Appointment: Follow-up for Type 2 Diabetes   History of Present Illness:          Date of diagnosis of type 2 diabetes mellitus:   2018      Background history:  He has never been told to have diabetes but review of his previous labs indicate that he had high blood sugar 179 in 2014 His highest sugar has been 234 in 10/2016 when his A1c was 8.8, not clear if he was symptomatic at that time  Recent history:   A1c is 9.1   Non-insulin hypoglycemic drugs the patient is taking are:   Prandin 2 mg at meals  Current management, blood sugar patterns and problems identified:  He still has not taken either [place] or Jardiance Also unclear whether he applied for patient assistance for either 1, previously had been given patient assistance forms for the [place] Also he is not sure whether the samples of Jardiance helped his sugars previously His blood sugars are about the same as before Despite taking Prandin reportedly before meals his blood sugars are mostly higher after meals  Unable to download his meter and difficult to get a good idea of his blood sugar pattern As usual his fasting readings are generally better Does not think he has changed his diet Weight is the same Not able to do any walking for exercise, does have some joint pains           Glucose monitoring:   has Accu-Chek guide meter  Blood sugar data from meter review:   PRE-MEAL Fasting Lunch Dinner Bedtime Overall  Glucose range: 128-197 197, 153 331    Mean/median:     181   Previously:  PRE-MEAL Fasting Lunch Dinner Bedtime Overall  Glucose range: 78-220      Mean/median:     182, 30-day   POST-MEAL PC Breakfast PC Lunch PC Dinner  Glucose range: 180 205   Mean/median:       Dietician visit, most recent: 2/21  Weight history:  Wt Readings from Last 3 Encounters:  11/12/22 183 lb (83 kg)   09/10/22 189 lb (85.7 kg)  09/03/22 190 lb 14.4 oz (86.6 kg)    Glycemic control:   Lab Results  Component Value Date   HGBA1C 9.1 (H) 11/09/2022   HGBA1C 10.5 (H) 09/07/2022   HGBA1C 9.7 (A) 09/03/2022   Lab Results  Component Value Date   MICROALBUR 12.6 (H) 07/21/2018   LDLCALC 51 06/16/2022   CREATININE 1.34 11/09/2022   Lab Results  Component Value Date   MICRALBCREAT 10.0 07/21/2018    Lab Results  Component Value Date   FRUCTOSAMINE 297 (H) 07/22/2021   FRUCTOSAMINE 347 (H) 04/14/2021   FRUCTOSAMINE 237 11/08/2020   Lab Results  Component Value Date   HGB 12.9 (L) 09/05/2021    Lab on 11/09/2022  Component Date Value Ref Range Status   Sodium 11/09/2022 137  135 - 145 mEq/L Final   Potassium 11/09/2022 3.8  3.5 - 5.1 mEq/L Final   Chloride 11/09/2022 104  96 - 112 mEq/L Final   CO2 11/09/2022 24  19 - 32 mEq/L Final   Glucose, Bld 11/09/2022 236 (H)  70 - 99 mg/dL Final   BUN 40/98/1191 24 (H)  6 - 23 mg/dL Final   Creatinine, Ser 11/09/2022 1.34  0.40 -  1.50 mg/dL Final   GFR 45/40/9811 54.87 (L)  >60.00 mL/min Final   Calculated using the CKD-EPI Creatinine Equation (2021)   Calcium 11/09/2022 8.9  8.4 - 10.5 mg/dL Final   Hgb B1Y MFr Bld 11/09/2022 9.1 (H)  4.6 - 6.5 % Final   Glycemic Control Guidelines for People with Diabetes:Non Diabetic:  <6%Goal of Therapy: <7%Additional Action Suggested:  >8%     Allergies as of 11/12/2022       Reactions   Metformin And Related Diarrhea        Medication List        Accurate as of Nov 12, 2022 11:50 AM. If you have any questions, ask your nurse or doctor.          Accu-Chek Guide Me w/Device Kit 1 each by Does not apply route.   Accu-Chek Guide test strip Generic drug: glucose blood Check blood sugar once a day   Accu-Chek Softclix Lancets lancets USE ACCU CHEK SOFTCLIX LANCETS TO CHECK BLOOD SUGAR FASTING OR 2 HOURS AFTER A MEAL EVERY OTHER DAY.   allopurinol 100 MG tablet Commonly known  as: ZYLOPRIM Take 100 mg by mouth daily.   amLODipine 10 MG tablet Commonly known as: NORVASC Take 1 tablet (10 mg total) by mouth daily.   aspirin EC 81 MG tablet Take 81 mg by mouth daily. Swallow whole.   atorvastatin 80 MG tablet Commonly known as: LIPITOR Take 1 tablet by mouth once daily   carvedilol 25 MG tablet Commonly known as: COREG TAKE 1 TABLET BY MOUTH TWICE DAILY WITH A MEAL   empagliflozin 25 MG Tabs tablet Commonly known as: Jardiance Take 1 tablet (25 mg total) by mouth daily before breakfast.   hydrALAZINE 50 MG tablet Commonly known as: APRESOLINE Take 1 tablet (50 mg total) by mouth 3 (three) times daily.   hydroxypropyl methylcellulose / hypromellose 2.5 % ophthalmic solution Commonly known as: ISOPTO TEARS / GONIOVISC Place 2 drops into the left eye as needed for dry eyes.   isosorbide mononitrate 30 MG 24 hr tablet Commonly known as: IMDUR Take 1 tablet (30 mg total) by mouth daily.   potassium chloride SA 20 MEQ tablet Commonly known as: KLOR-CON M Take 1 tablet by mouth once daily   predniSONE 10 MG (21) Tbpk tablet Commonly known as: STERAPRED UNI-PAK 21 TAB Take as directed   repaglinide 2 MG tablet Commonly known as: PRANDIN TAKE 1 TABLET BY MOUTH THREE TIMES DAILY BEFORE MEAL(S)        Allergies:  Allergies  Allergen Reactions   Metformin And Related Diarrhea    Past Medical History:  Diagnosis Date   Anemia    Benign localized prostatic hyperplasia with lower urinary tract symptoms (LUTS)    CAD (coronary artery disease)    cardiologist--- dr [place];   11-27-2020 cardiac cath for abnormal cardiolite,  severe 3V disease;   11-30-2000 s/p CABG x4;  cath 04-21-2005 patent SVG-OM1 & SVG --PDA graft's,  occluded radial graft -- OM, mod to sev LV dysfunction,  not candidate for revascularization   Chronic diastolic CHF (congestive heart failure) ([facility])    followed by cardiologist   Chronic kidney disease (CKD), stage IV (severe)  [facility])    nephrologist--- dr Marisue Humble   (lov note scanned in [place] , epic, dated 09-26-2020)   Dilated cardiomyopathy ([facility])    echo in 2009 showed improvement with a normal EF   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  03-08-2021   (09-03-2021  per pt wife is resolving)   History of COVID-19    spring 2022 with mild symptoms that resolved   History of pulmonary embolism 03/13/2020   complete blood thinner   History of sepsis    hx admission for severe sepsis twice;  admitted 03-13-2020 secondary to aspiration pneumonia w/ ARF and PE;   admitted 10-07-2020 secondary to MSSA uti   History of stroke    per imaging 05-07-2021 head CT ,  remote infarcts left thalamus, basal ganglia   HTN (hypertension)    Hyperlipemia    Ischemic heart disease    cardiologist--- dr [place];   Malignant neoplasm prostate [facility]) 05/2021   urologist-- dr eskridge/  oncologist--- dr Clelia Croft;  dx 11/ 2022,  Gleason 9, PSA 14   Multiple myeloma in remission [facility]) 2021   oncologist--- dr Candise Che--- dx 2021, IgG Kappa type, completed chemo 2021, in remission since   OA (osteoarthritis)    PAD (peripheral artery disease) ([facility])    RA (rheumatoid arthritis) [facility])    rheumatologist--- dr s. Corliss Skains---  multiple sites,  no other treatment other than takes tylenol arthritis with flare-ups   Tympanic membrane central perforation, left    followed by dr bates (ent)  chronic since 08/ 2022   (09-03-2021  per pt wife has partially closed)   Type 2 diabetes mellitus [facility])    endocrinologist--- dr a Lucianne Muss   Uses walker    Wears glasses     Past Surgical History:  Procedure Laterality Date   CARDIAC CATHETERIZATION  11/27/2000   @[facility]  by Dr [place];   severe 3V disease,  ef 15-25%   CARDIAC CATHETERIZATION  04/21/2005   by Dr [place];    patnet SVG -- OM1 & SVG --PDA grafts,  occluded radial graft --OM1,  atretic LIMA -- Diag,  moderate to severe LV dysfunction,  not candidate for revascularization    CATARACT EXTRACTION W/ INTRAOCULAR LENS IMPLANT Right 08/21/2021   CORONARY ARTERY BYPASS GRAFT  11/30/2000   @[facility]  by Dr Morton Peters;   LIMA -- Diag,  radial artery -- OM1,  SVG -- OM,  SVG -- RCA   GOLD SEED IMPLANT N/A 09/05/2021   Procedure: GOLD SEED IMPLANT;  Surgeon: Jerilee Field, MD;  [facility];  Service: Urology;  Laterality: N/A;  45 MINS FOR CASE   IR FLUORO GUIDED NEEDLE PLC ASPIRATION/INJECTION LOC  10/24/2019   SPACE OAR INSTILLATION N/A 09/05/2021   Procedure: SPACE OAR INSTILLATION;  Surgeon: Jerilee Field, MD;  Location: [facility];  Service: Urology;  Laterality: N/A;   TRANSURETHRAL RESECTION OF PROSTATE N/A 01/06/2021   Procedure: CYSTOSCOPY WITH CYSTOGRAM/TRANSURETHRAL RESECTION OF THE PROSTATE (TURP);  Surgeon: Crista Elliot, MD;  Location[facility];  Service: Urology;  Laterality: N/A;    Family History  Problem Relation Age of Onset   Hypertension Father    Diabetes Mother        DIABETIC COMA   Healthy Daughter     Social History:  reports that he has never smoked. He has never used smokeless tobacco. He reports that he does not currently use alcohol. He reports that he does not use drugs.   Review of Systems   Lipid history: On atorvastatin 80 mg from his cardiologist since diagnosis of CAD, labs as follows    Lab Results  Component Value Date   CHOL 116 06/16/2022   HDL 42 06/16/2022   LDLCALC 51 06/16/2022  LDLDIRECT 50.0 06/08/2022   TRIG 128 06/16/2022   CHOLHDL 2.8 06/16/2022           Hypertension: Has been followed by cardiologist for blood pressure management Blood pressure is again high  Aldosterone level has been checked for his history of hypokalemia and was normal  Recent readings:  BP Readings from Last 3 Encounters:  11/12/22 132/78  09/10/22 134/80  09/03/22 (!) 168/84   RENAL dysfunction:  Renal dysfunction: not related to diabetes Recently stable values No history of  microalbuminuria  Lab Results  Component Value Date   CREATININE 1.34 11/09/2022   CREATININE 1.49 09/07/2022   CREATININE 1.34 06/08/2022   Lab Results  Component Value Date   K 3.8 11/09/2022    Most recent eye exam was in 9/22  Most recent foot exam: 4/23   Physical Examination:  BP 132/78   Pulse 69   Ht 5' 9.5" (1.765 m)   Wt 183 lb (83 kg)   SpO2 96%   BMI 26.64 kg/m          ASSESSMENT:  Diabetes type 2, mild  See history of present illness for detailed discussion of current diabetes management, blood sugar patterns and problems identified  A1c is 9.1 compared to 10.5  Currently treated with Prandin only  His A1c has been consistently high Although he is reportedly taking only 5 mg of prednisone his blood sugars are not significantly better and he needs additional medications He cannot afford Jardiance or Farxiga because of cost and likely being in the donut hole Highest blood sugar over 300 Again has not received the [place] from patient assistance program and not clear if this was processed or completed   PLAN:     Although will try to get him the patient assistance support for either [place] or Jardiance he likely needs to be on insulin at least for now Discussed how to use insulin and how this would be used with the insulin pen Will start with NovoLog mix 10 units daily in the morning and increase progressively until afternoon readings are below 150 He will be instructed by the diabetes educator Continue Prandin for now  There are no Patient Instructions on file for this visit.      [name] 11/12/2022, 11:50 AM   Note: This office note was prepared with Dragon voice recognition system technology. Any transcriptional errors that result from this process are unintentional.

## 2022-11-13 ENCOUNTER — Other Ambulatory Visit: Payer: Self-pay

## 2022-11-20 ENCOUNTER — Telehealth: Payer: Self-pay

## 2022-11-20 NOTE — Progress Notes (Unsigned)
Care Management & Coordination Services Pharmacy Team  Reason for Encounter: Appointment Reminder  Contacted patient to confirm in office appointment with Delano Metz, PharmD on 11/24/2022 at 9:00. Unsuccessful outreach. Left voicemail for patient to return call. Left message of appointment reminder.   Have you seen any other providers since your last visit? **  Any changes in your medications or health?   Any side effects from any medications?   Do you have an symptoms or problems not managed by your medications?   Any concerns about your health right now?   Has your provider asked that you check blood pressure, blood sugar, or follow special diet at home?   Do you get any type of exercise on a regular basis?   Can you think of a goal you would like to reach for your health?   Do you have any problems getting your medications?   Is there anything that you would like to discuss during the appointment?   Left message to  bring medications and supplements to appointment   Chart review:  Recent office visits:  09/03/2022 [name], [name], MD - Patient was seen for Uncontrolled type 2 diabetes mellitus with hyperglycemia and additional concerns. No medication changes.   06/04/2023 [name], [name], MD - Patient was seen for Rheumatoid arthritis involving multiple sites, unspecified whether rheumatoid factor present and primary hypertension. No medication changes.   Recent consult visits:  11/12/2022 [name] MD (endocrinology) - Patient was seen for Uncontrolled type 2 diabetes mellitus with hyperglycemia, without long-term current use of insulin and an additional concern. Started Novolog flexpen 70/30 inject 20 units before breakfast. Although will try to get him the patient assistance support for either [place] or Jardiance he likely needs to be on insulin at least for now Discussed how to use insulin and how this would be used with the insulin pen Will  start with NovoLog mix 10 units daily in the morning and increase progressively until afternoon readings are below 150 He will be instructed by the diabetes educator Continue Prandin for now  11/04/2022 [name] (rheum) - Patient was seen for joint swelling and additional concerns. No additional chart notes.   09/10/2022 [name] (endo) - Patient was seen for Uncontrolled type 2 diabetes mellitus with hyperglycemia, without long-term current use of insulin. Started Jardiance 25 mg daily.   06/22/2022 [name] MD (card) - Patient was seen for Coronary artery disease involving coronary bypass graft of native heart without angina pectoris and additional concerns. Increased Amlodipine to 10 mg daily.   06/10/2022 [name] (endo) - Patient was seen for Uncontrolled type 2 diabetes mellitus with hyperglycemia, without long-term current use of insulin.No medication changes.   Hospital visits:  None   Care Gaps: AWV - never done Last eye exam - 07/21/2021 Last foot exam - 10/30/2021 Last BP - 132/78 on 11/12/2022 Last A1C - 9.1 on 11/09/2022 Hep C Screen - never done Shingrix - never done Urine ACR - overdue Covid - overdue Pneumovax - postponed Colonoscopy - postponed  Star Rating Drugs: Atorvastatin 80 mg - last filled 11/05/2022 90 DS at [facility] Jardiance 25 mg - last filled 11/10/222 30 DS at [facility] (Patient not taking: Reported on 11/12/2022)  Inetta Fermo [facility]  Clinical Pharmacist Assistant [phone]

## 2022-11-20 NOTE — Progress Notes (Unsigned)
Care Management & Coordination Services Pharmacy Note  11/20/2022 Name:  [name] MRN:  [id number] DOB:  January 31, 1956  Summary: ***  Recommendations/Changes made from today's visit: ***  Follow up plan: ***   Subjective: [name] is an 67 y.o. year old male who is a primary patient of [name], Joseph Patricia, MD.  The care coordination team was consulted for assistance with disease management and care coordination needs.    {CCMTELEPHONEFACETOFACE:21091510} for initial visit.  Recent office visits: 09/03/2022 [name], Joseph Patricia, MD - Patient was seen for Uncontrolled type 2 diabetes mellitus with hyperglycemia and additional concerns. No medication changes.    06/04/2023 [name], Joseph Patricia, MD - Patient was seen for Rheumatoid arthritis involving multiple sites, unspecified whether rheumatoid factor present and primary hypertension. No medication changes.  Recent consult visits: 11/12/2022 Joseph Littler MD (endocrinology) - Patient was seen for Uncontrolled type 2 diabetes mellitus with hyperglycemia, without long-term current use of insulin and an additional concern. Started Novolog flexpen 70/30 inject 20 units before breakfast. Although will try to get him the patient assistance support for either [name] or [name] he likely needs to be on insulin at least for now Discussed how to use insulin and how this would be used with the insulin pen Will start with NovoLog mix 10 units daily in the morning and increase progressively until afternoon readings are below 150 He will be instructed by the diabetes educator Continue Prandin for now   11/04/2022 [name] (rheum) - Patient was seen for joint swelling and additional concerns. No additional chart notes.    09/10/2022 [name] (endo) - Patient was seen for Uncontrolled type 2 diabetes mellitus with hyperglycemia, without long-term current use of insulin. Started [name] 25 mg daily.     06/22/2022 Joseph Swazil[place] MD (card) - Patient was seen for Coronary artery disease involving coronary bypass graft of native heart without angina pectoris and additional concerns. Increased Amlodipine to 10 mg daily.    06/10/2022 [name] (endo) - Patient was seen for Uncontrolled type 2 diabetes mellitus with hyperglycemia, without long-term current use of insulin.No medication changes.   Hospital visits: None in previous 6 months   Objective:  Lab Results  Component Value Date   CREATININE 1.34 11/09/2022   BUN 24 (H) 11/09/2022   GFR 54.87 (L) 11/09/2022   GFRNONAA 50 (L) 05/07/2021   GFRAA 58 (L) 04/23/2020   NA 137 11/09/2022   K 3.8 11/09/2022   CALCIUM 8.9 11/09/2022   CO2 24 11/09/2022   GLUCOSE 236 (H) 11/09/2022    Lab Results  Component Value Date/Time   HGBA1C 9.1 (H) 11/09/2022 11:05 AM   HGBA1C 10.5 (H) 09/07/2022 10:05 AM   FRUCTOSAMINE 297 (H) 07/22/2021 09:28 AM   FRUCTOSAMINE 347 (H) 04/14/2021 09:12 AM   GFR 54.87 (L) 11/09/2022 11:05 AM   GFR 48.37 (L) 09/07/2022 10:05 AM   MICROALBUR 12.6 (H) 07/21/2018 03:07 PM    Last diabetic Eye exam:  Lab Results  Component Value Date/Time   HMDIABEYEEXA No Retinopathy 07/21/2021 12:00 AM    Last diabetic Foot exam: No results found for: "HMDIABFOOTEX"   Lab Results  Component Value Date   CHOL 116 06/16/2022   HDL 42 06/16/2022   LDLCALC 51 06/16/2022   LDLDIRECT 50.0 06/08/2022   TRIG 128 06/16/2022   CHOLHDL 2.8 06/16/2022       Latest Ref Rng & Units 05/07/2021    3:01 PM 03/08/2021    6:52 AM  11/13/2020    2:12 PM  Hepatic Function  Total Protein 6.5 - 8.1 g/dL 7.5  7.6  5.7   Albumin 3.5 - 5.0 g/dL 3.9  4.0  3.5   AST 15 - 41 U/L 13  15  11    ALT 0 - 44 U/L 13  11  6    Alk Phosphatase 38 - 126 U/L 96  111  90   Total Bilirubin 0.3 - 1.2 mg/dL 1.2  1.3  0.5     Lab Results  Component Value Date/Time   TSH 0.916 10/07/2020 12:55 PM   TSH 0.355 03/13/2020 10:25 AM   TSH 0.592  09/04/2019 04:31 PM   TSH 0.55 02/11/2016 09:34 AM       Latest Ref Rng & Units 09/05/2021    6:22 AM 05/07/2021    3:01 PM 03/08/2021    6:52 AM  CBC  WBC 4.0 - 10.5 K/uL  9.9  7.3   Hemoglobin 13.0 - 17.0 g/dL 96.0  45.4  09.8   Hematocrit 39.0 - 52.0 % 38.0  39.6  42.9   Platelets 150 - 400 K/uL  203  227     No results found for: "VD25OH", "VITAMINB12"  Clinical ASCVD: Yes  The ASCVD Risk score (Arnett DK, et al., 2019) failed to calculate for the following reasons:   The valid total cholesterol range is 130 to 320 mg/dL       07/31/1476   29:56 AM 11/13/2020    1:44 PM 10/04/2019   11:20 AM  Depression screen PHQ 2/9  Decreased Interest 0 0 0  Down, Depressed, Hopeless 0 0 0  PHQ - 2 Score 0 0 0  Altered sleeping 0 0 0  Tired, decreased energy 0 0 0  Change in appetite 0 0 0  Feeling bad or failure about yourself  0 0 0  Trouble concentrating 0 0 0  Moving slowly or fidgety/restless 0 0 0  Suicidal thoughts 0 0 0  PHQ-9 Score 0 0 0  Difficult doing work/chores Not difficult at all Not difficult at all Not difficult at all     Social History   Tobacco Use  Smoking Status Never  Smokeless Tobacco Never   BP Readings from Last 3 Encounters:  11/12/22 132/78  09/10/22 134/80  09/03/22 (!) 168/84   Pulse Readings from Last 3 Encounters:  11/12/22 69  09/10/22 71  09/03/22 (!) 58   Wt Readings from Last 3 Encounters:  11/12/22 183 lb (83 kg)  09/10/22 189 lb (85.7 kg)  09/03/22 190 lb 14.4 oz (86.6 kg)   BMI Readings from Last 3 Encounters:  11/12/22 26.64 kg/m  09/10/22 27.51 kg/m  09/03/22 27.79 kg/m    Allergies  Allergen Reactions   Metformin And Related Diarrhea    Medications Reviewed Today     Reviewed by Dorna Mai, CMA (Certified Medical Assistant) on 11/12/22 at 1134  Med List Status: <None>   Medication Order Taking? Sig Documenting Provider Last Dose Status Informant  Accu-Chek Softclix Lancets lancets [id number] Yes USE ACCU  CHEK SOFTCLIX LANCETS TO CHECK BLOOD SUGAR FASTING OR 2 HOURS AFTER A MEAL EVERY OTHER DAY. Joseph Littler, MD Taking Active   allopurinol (ZYLOPRIM) 100 MG tablet [id number] Yes Take 100 mg by mouth daily. [provider] Taking Active   amLODipine (NORVASC) 10 MG tablet [id number] Yes Take 1 tablet (10 mg total) by mouth daily. Welch, Joseph M, MD Taking Active   aspirin EC 81 MG tablet [id number]  Yes Take 81 mg by mouth daily. Swallow whole. [provider] Taking Active Spouse/Significant Other  atorvastatin (LIPITOR) 80 MG tablet [id number] Yes Take 1 tablet by mouth once daily Welch, Joseph M, MD Taking Active   Blood Glucose Monitoring Suppl (ACCU-CHEK GUIDE ME) w/Device KIT [id number] Yes 1 each by Does not apply route. [provider] Taking Active Self  carvedilol (COREG) 25 MG tablet [id number] Yes TAKE 1 TABLET BY MOUTH TWICE DAILY WITH A MEAL Welch, Joseph M, MD Taking Active   empagliflozin ([name]) 25 MG TABS tablet [id number] No Take 1 tablet (25 mg total) by mouth daily before breakfast.  Patient not taking: Reported on 11/12/2022   Joseph Littler, MD Not Taking Active   glucose blood (ACCU-CHEK GUIDE) test strip [id number] Yes Check blood sugar once a day Joseph Littler, MD Taking Active   hydrALAZINE (APRESOLINE) 50 MG tablet [id number] Yes Take 1 tablet (50 mg total) by mouth 3 (three) times daily. Welch, Joseph M, MD Taking Active   hydroxypropyl methylcellulose / hypromellose (ISOPTO TEARS / GONIOVISC) 2.5 % ophthalmic solution [id number] Yes Place 2 drops into the left eye as needed for dry eyes. Arthor Captain, PA-C Taking Active Spouse/Significant Other  isosorbide mononitrate (IMDUR) 30 MG 24 hr tablet [id number] Yes Take 1 tablet (30 mg total) by mouth daily. Welch, Joseph M, MD Taking Active   potassium chloride SA (KLOR-CON M) 20 MEQ tablet [id number] Yes Take 1 tablet by mouth once daily Joseph Littler, MD Taking Active   predniSONE (STERAPRED UNI-PAK 21 TAB) 10 MG  (21) TBPK tablet [id number] Yes Take as directed [name], Joseph Patricia, MD Taking Active   repaglinide (PRANDIN) 2 MG tablet [id number] Yes TAKE 1 TABLET BY MOUTH THREE TIMES DAILY BEFORE MEAL(S) Joseph Littler, MD Taking Active   sodium chloride flush (NS) 0.9 % injection 10 mL [id number]   Johney Maine, MD  Active   sodium chloride flush (NS) 0.9 % injection 10 mL [id number]   Johney Maine, MD  Active             SDOH:  (Social Determinants of Health) assessments and interventions performed: Yes SDOH Interventions    Flowsheet Row Telephone from 04/03/2022 in [facility] Coordination  SDOH Interventions   Food Insecurity Interventions Intervention Not Indicated  Transportation Interventions Intervention Not Indicated  Utilities Interventions Intervention Not Indicated       Medication Assistance: {MEDASSISTANCEINFO:25044}  Medication Access: Within the past 30 days, how often has patient missed a dose of medication? *** Is a pillbox or other method used to improve adherence? {YES/NO:21197} Factors that may affect medication adherence? {CHL DESC; BARRIERS:21522} Are meds synced by current pharmacy? {YES/NO:21197} Are meds delivered by current pharmacy? {YES/NO:21197} Does patient experience delays in picking up medications due to transportation concerns? {YES/NO:21197}  [facility] Reviewed: Is patient disadvantaged to use [facility]?: Yes  Current Rx insurance plan: [employer] Name and location of Current pharmacy:  [facility] [address] ([address]), [place] - [address] [place] ([place]) [place] [address] Phone: [phone] Fax: [phone]  [facility] services reviewed with patient today?: No  Patient requests to transfer care to [facility]?: No  Reason patient declined to change pharmacies: Disadvantaged due to insurance/mail order  Compliance/Adherence/Medication fill  history: Care Gaps: AWV - never done Last eye exam - 07/21/2021 Last foot exam - 10/30/2021 Last BP - 132/78 on 11/12/2022 Last A1C - 9.1 on 11/09/2022 Hep C Screen - never  done Shingrix - never done Urine ACR - overdue Covid - overdue Pneumovax - postponed Colonoscopy - postponed  Star-Rating Drugs: Atorvastatin 80 mg - last filled 11/05/2022 90 DS at [facility] [name] 25 mg - last filled 11/10/222 30 DS at [facility] (Patient not taking: Reported on 11/12/2022)   Assessment/Plan Hypertension (BP goal <140/90) -{US controlled/uncontrolled:25276} -Current treatment: Amlodipine 10mg  1 qd Hydralazine 50mg  1 TID -Medications previously tried: Clonidine, Lisinopril, Losartan, Ramipril -Current home readings: *** -Current dietary habits: *** -Current exercise habits: *** -{ACTIONS;DENIES/REPORTS:21021675::"Denies"} hypotensive/hypertensive symptoms -Educated on {CCM BP Counseling:25124} -Counseled to monitor BP at home ***, document, and provide log at future appointments -{CCMPHARMDINTERVENTION:25122}  Hyperlipidemia: (LDL goal < 70) -Controlled -Current treatment: Atorvastatin 80mg  1 qd -Medications previously tried: None  -Current dietary patterns: *** -Current exercise habits: *** -Educated on {CCM HLD Counseling:25126} -{CCMPHARMDINTERVENTION:25122}  Diabetes (A1c goal <7%) -Uncontrolled -Current medications: [name] 25mg  1 qd Prandin 2mg  1 TID before meals Novolog mix 70/30 up to 20 units daily as directed -Medications previously tried: Amaryl, Glipizide, Metformin, Januvia -Current home glucose readings fasting glucose: *** post prandial glucose: *** -{ACTIONS;DENIES/REPORTS:21021675::"Denies"} hypoglycemic/hyperglycemic symptoms -Current meal patterns:  breakfast: ***  lunch: ***  dinner: *** snacks: *** drinks: *** -Current exercise: *** -Educated on {CCM DM COUNSELING:25123} -Counseled to check feet daily and get yearly eye  exams -{CCMPHARMDINTERVENTION:25122}  Heart Failure (Goal: manage symptoms and prevent exacerbations) -{US controlled/uncontrolled:25276} -Last ejection fraction: 55-60% (Date: 03/14/20) -HF type: HFpEF (EF > 50%) -NYHA Class: II (slight limitation of activity) -AHA HF Stage: C (Heart disease and symptoms present) -Current treatment: [name] 25mg  1 qd Carvedilol 25mg  1 BID [place]-Con 20 mEq 1 qd -Medications previously tried: Chief Financial Officer, Spironolactone, Lasix -Current home BP/HR readings: *** -Current home daily weights: *** -Current dietary habits: *** -Current exercise habits: *** -Educated on {CCM HF Counseling:25125} -{CCMPHARMDINTERVENTION:25122}  CAD (Goal: Slow progression of atherosclerosis (plaques / blockages) throughout your body to reduce risk of heart attack and strokes) -Not assessed today Current Medication Therapy: Aspirin 81mg  EC 1 qd Isosorbide Mono ER 30mg  1 qd  Query Gout  -Not assessed today -Current treatment  Allopurinol 100mg  1 qd    OTC  -Current treatment  ***   Sherrill Raring Clinical Pharmacist [phone]

## 2022-11-24 ENCOUNTER — Ambulatory Visit: Payer: Medicare Other

## 2022-11-24 NOTE — Progress Notes (Signed)
Care Management & Coordination Services Pharmacy Note  11/24/2022 Name:  [name] MRN:  [id number] DOB:  [birth year]  Summary: BP at goal <140/90 LDL at goal <70 A1C not at goal <7 (uncontrolled at 9.1 last check)  Recommendations/Changes made from today's visit: -Counseled to be mindful of salt intake and check BP at home once weekly, counseled on proper BP monitoring technique -Counseled on low-carb, cholesterol-mindful diet and importance of statin adherence -Patient approved for healthwell assistance to obtain Jardiance for free -Start Jardiance 25mg  (11/25/22) and continue insulin as previously directed by endo -Reminded patient to schedule app with diabetes educator   Follow up plan: -DM follow up in 2 weeks to assess jardiance tolerance and effect   Subjective: [name] is an 67 y.o. year old male who is a primary patient of [name], [name], MD.  The care coordination team was consulted for assistance with disease management and care coordination needs.    Engaged with patient by telephone for initial visit.  Recent office visits: 09/03/2022 [name], [name], MD - Patient was seen for Uncontrolled type 2 diabetes mellitus with hyperglycemia and additional concerns. No medication changes.    06/04/2023 [name], [name], MD - Patient was seen for Rheumatoid arthritis involving multiple sites, unspecified whether rheumatoid factor present and primary hypertension. No medication changes.  Recent consult visits: 11/12/2022 [name] MD (endocrinology) - Patient was seen for Uncontrolled type 2 diabetes mellitus with hyperglycemia, without long-term current use of insulin and an additional concern. Started Novolog flexpen 70/30 inject 20 units before breakfast. Although will try to get him the patient assistance support for either [place] or Jardiance he likely needs to be on insulin at least for now Discussed how to use insulin  and how this would be used with the insulin pen Will start with NovoLog mix 10 units daily in the morning and increase progressively until afternoon readings are below 150 He will be instructed by the diabetes educator Continue Prandin for now   11/04/2022 [name] (rheum) - Patient was seen for joint swelling and additional concerns. No additional chart notes.    09/10/2022 [name] (endo) - Patient was seen for Uncontrolled type 2 diabetes mellitus with hyperglycemia, without long-term current use of insulin. Started Jardiance 25 mg daily.    06/22/2022 [name] MD (card) - Patient was seen for Coronary artery disease involving coronary bypass graft of native heart without angina pectoris and additional concerns. Increased Amlodipine to 10 mg daily.    06/10/2022 [name] (endo) - Patient was seen for Uncontrolled type 2 diabetes mellitus with hyperglycemia, without long-term current use of insulin.No medication changes.   Hospital visits: None in previous 6 months   Objective:  Lab Results  Component Value Date   CREATININE 1.34 11/09/2022   BUN 24 (H) 11/09/2022   GFR 54.87 (L) 11/09/2022   GFRNONAA 50 (L) 05/07/2021   GFRAA 58 (L) 04/23/2020   NA 137 11/09/2022   K 3.8 11/09/2022   CALCIUM 8.9 11/09/2022   CO2 24 11/09/2022   GLUCOSE 236 (H) 11/09/2022    Lab Results  Component Value Date/Time   HGBA1C 9.1 (H) 11/09/2022 11:05 AM   HGBA1C 10.5 (H) 09/07/2022 10:05 AM   FRUCTOSAMINE 297 (H) 07/22/2021 09:28 AM   FRUCTOSAMINE 347 (H) 04/14/2021 09:12 AM   GFR 54.87 (L) 11/09/2022 11:05 AM   GFR 48.37 (L) 09/07/2022 10:05 AM   MICROALBUR 12.6 (H) 07/21/2018 03:07 PM  Last diabetic Eye exam:  Lab Results  Component Value Date/Time   HMDIABEYEEXA No Retinopathy 07/21/2021 12:00 AM    Last diabetic Foot exam: No results found for: "HMDIABFOOTEX"   Lab Results  Component Value Date   CHOL 116 06/16/2022   HDL 42 06/16/2022   LDLCALC 51 06/16/2022    LDLDIRECT 50.0 06/08/2022   TRIG 128 06/16/2022   CHOLHDL 2.8 06/16/2022       Latest Ref Rng & Units 05/07/2021    3:01 PM 03/08/2021    6:52 AM 11/13/2020    2:12 PM  Hepatic Function  Total Protein 6.5 - 8.1 g/dL 7.5  7.6  5.7   Albumin 3.5 - 5.0 g/dL 3.9  4.0  3.5   AST 15 - 41 U/L 13  15  11    ALT 0 - 44 U/L 13  11  6    Alk Phosphatase 38 - 126 U/L 96  111  90   Total Bilirubin 0.3 - 1.2 mg/dL 1.2  1.3  0.5     Lab Results  Component Value Date/Time   TSH 0.916 10/07/2020 12:55 PM   TSH 0.355 03/13/2020 10:25 AM   TSH 0.592 09/04/2019 04:31 PM   TSH 0.55 02/11/2016 09:34 AM       Latest Ref Rng & Units 09/05/2021    6:22 AM 05/07/2021    3:01 PM 03/08/2021    6:52 AM  CBC  WBC 4.0 - 10.5 K/uL  9.9  7.3   Hemoglobin 13.0 - 17.0 g/dL 16.1  09.6  04.5   Hematocrit 39.0 - 52.0 % 38.0  39.6  42.9   Platelets 150 - 400 K/uL  203  227     No results found for: "VD25OH", "VITAMINB12"  Clinical ASCVD: Yes  The ASCVD Risk score (Arnett DK, et al., 2019) failed to calculate for the following reasons:   The valid total cholesterol range is 130 to 320 mg/dL       10/19/8117   14:78 AM 11/13/2020    1:44 PM 10/04/2019   11:20 AM  Depression screen PHQ 2/9  Decreased Interest 0 0 0  Down, Depressed, Hopeless 0 0 0  PHQ - 2 Score 0 0 0  Altered sleeping 0 0 0  Tired, decreased energy 0 0 0  Change in appetite 0 0 0  Feeling bad or failure about yourself  0 0 0  Trouble concentrating 0 0 0  Moving slowly or fidgety/restless 0 0 0  Suicidal thoughts 0 0 0  PHQ-9 Score 0 0 0  Difficult doing work/chores Not difficult at all Not difficult at all Not difficult at all     Social History   Tobacco Use  Smoking Status Never  Smokeless Tobacco Never   BP Readings from Last 3 Encounters:  11/12/22 132/78  09/10/22 134/80  09/03/22 (!) 168/84   Pulse Readings from Last 3 Encounters:  11/12/22 69  09/10/22 71  09/03/22 (!) 58   Wt Readings from Last 3 Encounters:   11/12/22 183 lb (83 kg)  09/10/22 189 lb (85.7 kg)  09/03/22 190 lb 14.4 oz (86.6 kg)   BMI Readings from Last 3 Encounters:  11/12/22 26.64 kg/m  09/10/22 27.51 kg/m  09/03/22 27.79 kg/m    Allergies  Allergen Reactions   Metformin And Related Diarrhea    Medications Reviewed Today     Reviewed by Sherrill Raring, RPH (Pharmacist) on 11/24/22 at 1345  Med List Status: <None>   Medication Order  Taking? Sig Documenting Provider Last Dose Status Informant  Accu-Chek Softclix Lancets lancets [id number]  USE ACCU CHEK SOFTCLIX LANCETS TO CHECK BLOOD SUGAR FASTING OR 2 HOURS AFTER A MEAL EVERY OTHER DAY. [name], MD  Active   allopurinol (ZYLOPRIM) 100 MG tablet [id number]  Take 100 mg by mouth daily. [provider]  Active   amLODipine (NORVASC) 10 MG tablet [id number]  Take 1 tablet (10 mg total) by mouth daily. [place], Peter M, MD  Active   aspirin EC 81 MG tablet [id number]  Take 81 mg by mouth daily. Swallow whole. [provider]  Active Spouse/Significant Other  atorvastatin (LIPITOR) 80 MG tablet [id number]  Take 1 tablet by mouth once daily [place], Peter M, MD  Active   Blood Glucose Monitoring Suppl (ACCU-CHEK GUIDE ME) w/Device KIT [id number]  1 each by Does not apply route. [provider]  Active Self  carvedilol (COREG) 25 MG tablet [id number]  TAKE 1 TABLET BY MOUTH TWICE DAILY WITH A MEAL [place], Peter M, MD  Active   empagliflozin (JARDIANCE) 25 MG TABS tablet [id number]  Take 1 tablet (25 mg total) by mouth daily before breakfast.  Patient not taking: Reported on 11/12/2022   [name], MD  Active   glucose blood (ACCU-CHEK GUIDE) test strip [id number]  Check blood sugar once a day [name], MD  Active   hydrALAZINE (APRESOLINE) 50 MG tablet [id number]  Take 1 tablet (50 mg total) by mouth 3 (three) times daily. [place], Peter M, MD  Active   hydroxychloroquine (PLAQUENIL) 200 MG tablet [id number] Yes Take 400 mg by mouth daily. [provider]  Active Self  hydroxypropyl methylcellulose / hypromellose (ISOPTO TEARS / GONIOVISC) 2.5 % ophthalmic solution [id number]  Place 2 drops into the left eye as needed for dry eyes. Arthor Captain, PA-C  Active Spouse/Significant Other  insulin aspart protamine - aspart (NOVOLOG MIX 70/30 FLEXPEN) (70-30) 100 UNIT/ML FlexPen [id number]  Upto 20 units before breakfast as directed [name], MD  Active   Insulin Pen Needle 31G X 5 MM MISC [id number]  Use for pen [name], MD  Active   isosorbide mononitrate (IMDUR) 30 MG 24 hr tablet [id number]  Take 1 tablet (30 mg total) by mouth daily. [place], Peter M, MD  Active   potassium chloride SA (KLOR-CON M) 20 MEQ tablet [id number]  Take 1 tablet by mouth once daily [name], MD  Active   predniSONE (STERAPRED UNI-PAK 21 TAB) 10 MG (21) TBPK tablet [id number]  Take as directed [name], [name], MD  Active   repaglinide (PRANDIN) 2 MG tablet [id number]  TAKE 1 TABLET BY MOUTH THREE TIMES DAILY BEFORE MEAL(S) [name], MD  Active             SDOH:  (Social Determinants of Health) assessments and interventions performed: Yes SDOH Interventions    Flowsheet Row Care Coordination from 11/24/2022 in [facility] Telephone from 04/03/2022 in [facility] Care Coordination  SDOH Interventions    Food Insecurity Interventions Intervention Not Indicated Intervention Not Indicated  Housing Interventions Intervention Not Indicated --  Transportation Interventions -- Intervention Not Indicated  Utilities Interventions -- Intervention Not Indicated       Medication Assistance: Jardiance assistance obtained through [employer]. Assistance ends 10/24/2023  Medication Access: Within the past 30 days, how often has patient missed a dose of medication? None Is a pillbox or other method used to improve adherence? Yes  Factors that may affect medication  adherence? financial need Are meds synced by  current pharmacy? No  Are meds delivered by current pharmacy? Yes  Does patient experience delays in picking up medications due to transportation concerns? No   [facility] Reviewed: Is patient disadvantaged to use [facility]?: Yes  Current Rx insurance plan: [employer] Name and location of Current pharmacy:  [id number] - [place] ([place]), [place] - [address] [place] ([place]) [place] [address] Phone: [phone] Fax: [phone]  [facility] services reviewed with patient today?: No  Patient requests to transfer care to [facility]?: No  Reason patient declined to change pharmacies: Disadvantaged due to insurance/mail order  Compliance/Adherence/Medication fill history: Care Gaps: AWV - never done Last eye exam - 07/21/2021 Last foot exam - 10/30/2021 Last BP - 132/78 on 11/12/2022 Last A1C - 9.1 on 11/09/2022 Hep C Screen - never done Shingrix - never done Urine ACR - overdue Covid - overdue Pneumovax - postponed Colonoscopy - postponed  Star-Rating Drugs: Atorvastatin 80 mg - last filled 11/05/2022 90 DS at [facility] Jardiance 25 mg - last filled 11/10/222 30 DS at [facility] (Patient not taking: Reported on 11/12/2022)   Assessment/Plan Hypertension (BP goal <140/90) -Controlled -Current treatment: Amlodipine 10mg  1 qd Appropriate, Effective, Safe, Accessible Hydralazine 50mg  1 TID Appropriate, Effective, Safe, Accessible -Medications previously tried: Clonidine, Lisinopril, Losartan, Ramipril -Current home readings: see below -Current dietary habits: mindful of salt intake -Current exercise habits: see below -Denies hypotensive/hypertensive symptoms -Educated on BP goals and benefits of medications for prevention of heart attack, stroke and kidney damage; Daily salt intake goal < 2300 mg; Exercise goal of 150 minutes per week; Importance of home blood pressure monitoring; Proper BP monitoring technique; -Counseled to  monitor BP at home weekly, document, and provide log at future appointments -Recommended to continue current medication  Hyperlipidemia: (LDL goal < 70) -Controlled -Current treatment: Atorvastatin 80mg  1 qd -Medications previously tried: None  -Current dietary patterns: see beloew -Current exercise habits: see below -Educated on Cholesterol goals;  Benefits of statin for ASCVD risk reduction; Exercise goal of 150 minutes per week; -Recommended to continue current medication  Diabetes (A1c goal <7%) -Uncontrolled -Current medications: Jardiance 25mg  1 qd Prandin 2mg  1 TID before meals Appropriate, Effective, Safe, Accessible Novolog mix 70/30 up to 20 units daily as directed Appropriate, Query effective -Medications previously tried: Amaryl, Glipizide, Metformin, Januvia -Current home glucose readings - checking fasting and 3 hours after a meal fasting glucose: 125-128 post prandial glucose: <200 -Denies hypoglycemic/hyperglycemic symptoms -Current meal patterns: 3 meals a day breakfast: [place] sausage with toast and a egg with grape jelly, cereal  lunch: go get a burger and fries from [employer], or lean [place] burger at home from [employer]  dinner: wife cooks, meat and green veggies (baked or boiled), potatoes snacks: debbie oatmeal cakes sometimes drinks: water, sometimes a glass of soda -Current exercise: just bought a treadmill for walking and does that 15-55min in mornings and evenings -Educated on A1c and blood sugar goals; Complications of diabetes including kidney damage, retinal damage, and cardiovascular disease; Exercise goal of 150 minutes per week; Prevention and management of hypoglycemic episodes; Continuous glucose monitoring; -Counseled to check feet daily and get yearly eye exams -Counseled on diet and exercise extensively -Recommend start Jardiance and continue insulin. Restart back at 10 units and slowly increase until sugars below 150 in afternoon, per endo  instruction  Heart Failure (Goal: manage symptoms and prevent exacerbations) -Controlled -Last ejection fraction: 55-60% (Date: 03/14/20) -HF  type: HFpEF (EF > 50%) -NYHA Class: II (slight limitation of activity) -AHA HF Stage: C (Heart disease and symptoms present) -Current treatment: Jardiance 25mg  1 qd Appropriate, Query effective Carvedilol 25mg  1 BID Appropriate, Effective, Safe, Accessible Klor-Con 20 mEq 1 qd Appropriate, Effective, Safe, Accessible -Medications previously tried: Chief Financial Officer, Spironolactone, Lasix -Current home BP/HR readings: not checking, has a wrist cuff -Current home daily weights: have a scale -Current dietary habits: mindful of salt intake -Current exercise habits: see above -Educated on Benefits of medications for managing symptoms and prolonging life Importance of weighing daily; if you gain more than 3 pounds in one day or 5 pounds in one week, contact cardiologist or [place] Importance of blood pressure control -Counseled on diet and exercise extensively Recommended to continue current medication  CAD (Goal: Slow progression of atherosclerosis (plaques / blockages) throughout your body to reduce risk of heart attack and strokes) -Not assessed today Current Medication Therapy: Aspirin 81mg  EC 1 qd Appropriate, Effective, Safe, Accessible Isosorbide Mono ER 30mg  1 qd Appropriate, Effective, Safe, Accessibl  Query Gouty arthritis/Rheumatoid Arthritis  -Not assessed today -Current treatment  Allopurinol 100mg  1 qd Appropriate, Effective, Safe, Accessible Plaquenil 200mg  2 tabs daily Appropriate, Effective, Safe, Accessible   Sherrill Raring Clinical Pharmacist [phone]

## 2022-12-02 ENCOUNTER — Encounter: Payer: Self-pay | Admitting: Internal Medicine

## 2022-12-02 ENCOUNTER — Ambulatory Visit (INDEPENDENT_AMBULATORY_CARE_PROVIDER_SITE_OTHER): Payer: Medicare Other | Admitting: Internal Medicine

## 2022-12-02 VITALS — BP 130/80 | HR 70 | Temp 98.2°F | Wt 181.5 lb

## 2022-12-02 DIAGNOSIS — M069 Rheumatoid arthritis, unspecified: Secondary | ICD-10-CM

## 2022-12-02 DIAGNOSIS — Z794 Long term (current) use of insulin: Secondary | ICD-10-CM | POA: Diagnosis not present

## 2022-12-02 DIAGNOSIS — I10 Essential (primary) hypertension: Secondary | ICD-10-CM

## 2022-12-02 DIAGNOSIS — E7800 Pure hypercholesterolemia, unspecified: Secondary | ICD-10-CM | POA: Diagnosis not present

## 2022-12-02 DIAGNOSIS — C61 Malignant neoplasm of prostate: Secondary | ICD-10-CM

## 2022-12-02 DIAGNOSIS — N1831 Chronic kidney disease, stage 3a: Secondary | ICD-10-CM

## 2022-12-02 DIAGNOSIS — E1165 Type 2 diabetes mellitus with hyperglycemia: Secondary | ICD-10-CM

## 2022-12-02 DIAGNOSIS — I2510 Atherosclerotic heart disease of native coronary artery without angina pectoris: Secondary | ICD-10-CM | POA: Diagnosis not present

## 2022-12-02 LAB — POCT GLYCOSYLATED HEMOGLOBIN (HGB A1C): Hemoglobin A1C: 7.1 % — AB (ref 4.0–5.6)

## 2022-12-02 NOTE — Assessment & Plan Note (Signed)
Well controlled on amlodipine, hydralazine, coreg and imdur.

## 2022-12-02 NOTE — Assessment & Plan Note (Signed)
Well controlled. LDL 51 in 12/23.

## 2022-12-02 NOTE — Assessment & Plan Note (Signed)
A1c improving now off prednisone and started on insulin. Follows with endocrinology, Dr. Lucianne Muss.

## 2022-12-02 NOTE — Progress Notes (Signed)
Established Patient Office Visit     CC/Reason for Visit: Follow-up chronic conditions  HPI: [name] is a 67 y.o. male who is coming in today for the above mentioned reasons. Past Medical History is significant for: Hypertension, hyperlipidemia, type 2 diabetes, prostate cancer, rheumatoid arthritis, coronary artery disease.  He is feeling well and has no acute concerns.  He was recently started on insulin for an A1c of 9.1.  His A1c has decreased to 7.5.  He was also started on Jardiance.   Past Medical/Surgical History: Past Medical History:  Diagnosis Date   Anemia    Benign localized prostatic hyperplasia with lower urinary tract symptoms (LUTS)    CAD (coronary artery disease)    cardiologist--- dr Swaziland;   11-27-2020 cardiac cath for abnormal cardiolite,  severe 3V disease;   11-30-2000 s/p CABG x4;  cath 04-21-2005 patent SVG-OM1 & SVG --PDA graft's,  occluded radial graft -- OM, mod to sev LV dysfunction,  not candidate for revascularization   Chronic diastolic CHF (congestive heart failure) (HCC)    followed by cardiologist   Chronic kidney disease (CKD), stage IV (severe) (HCC)    nephrologist--- dr Marisue [facility]   (lov note scanned in [place] , epic, dated 09-26-2020)   Dilated cardiomyopathy (HCC)    echo in 2009 showed improvement with a normal EF   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 03-08-2021   (09-03-2021  per pt wife is resolving)   History of COVID-19    spring 2022 with mild symptoms that resolved   History of pulmonary embolism 03/13/2020   complete blood thinner   History of sepsis    hx admission for severe sepsis twice;  admitted 03-13-2020 secondary to aspiration pneumonia w/ ARF and PE;   admitted 10-07-2020 secondary to MSSA uti   History of stroke    per imaging 05-07-2021 head CT ,  remote infarcts left thalamus, basal ganglia   HTN (hypertension)    Hyperlipemia    Ischemic heart disease    cardiologist--- dr  Swaziland;   Malignant neoplasm prostate [facility]) 05/2021   urologist-- dr eskridge/  oncologist--- dr Clelia Croft;  dx 11/ 2022,  Gleason 9, PSA 14   Multiple myeloma in remission [facility]) 2021   oncologist--- dr Candise Che--- dx 2021, IgG Kappa type, completed chemo 2021, in remission since   OA (osteoarthritis)    PAD (peripheral artery disease) (HCC)    RA (rheumatoid arthritis) [facility])    rheumatologist--- dr s. Corliss Skains---  multiple sites,  no other treatment other than takes tylenol arthritis with flare-ups   Tympanic membrane central perforation, left    followed by dr bates (ent)  chronic since 08/ 2022   (09-03-2021  per pt wife has partially closed)   Type 2 diabetes mellitus [facility])    endocrinologist--- dr a Lucianne Muss   Uses walker    Wears glasses     Past Surgical History:  Procedure Laterality Date   CARDIAC CATHETERIZATION  11/27/2000   @[facility]  by Dr Swaziland;   severe 3V disease,  ef 15-25%   CARDIAC CATHETERIZATION  04/21/2005   by Dr Swaziland;    patnet SVG -- OM1 & SVG --PDA grafts,  occluded radial graft --OM1,  atretic LIMA -- Diag,  moderate to severe LV dysfunction,  not candidate for revascularization   CATARACT EXTRACTION W/ INTRAOCULAR LENS IMPLANT Right 08/21/2021   CORONARY ARTERY BYPASS GRAFT  11/30/2000   @[facility]  by Dr Zenaida Niece  Tright;   LIMA -- Diag,  radial artery -- OM1,  SVG -- OM,  SVG -- RCA   GOLD SEED IMPLANT N/A 09/05/2021   Procedure: GOLD SEED IMPLANT;  Surgeon: Jerilee Field, MD;  Location: [facility];  Service: Urology;  Laterality: N/A;  45 MINS FOR CASE   IR FLUORO GUIDED NEEDLE PLC ASPIRATION/INJECTION LOC  10/24/2019   SPACE OAR INSTILLATION N/A 09/05/2021   Procedure: SPACE OAR INSTILLATION;  Surgeon: Jerilee Field, MD;  Location: [facility];  Service: Urology;  Laterality: N/A;   TRANSURETHRAL RESECTION OF PROSTATE N/A 01/06/2021   Procedure: CYSTOSCOPY WITH CYSTOGRAM/TRANSURETHRAL RESECTION OF THE PROSTATE (TURP);  Surgeon: Crista Elliot, MD;  Location: [facility];  Service: Urology;  Laterality: N/A;    Social History:  reports that he has never smoked. He has never used smokeless tobacco. He reports that he does not currently use alcohol. He reports that he does not use drugs.  Allergies: Allergies  Allergen Reactions   Metformin And Related Diarrhea    Family History:  Family History  Problem Relation Age of Onset   Hypertension Father    Diabetes Mother        DIABETIC COMA   Healthy Daughter      Current Outpatient Medications:    Accu-Chek Softclix Lancets lancets, USE ACCU CHEK SOFTCLIX LANCETS TO CHECK BLOOD SUGAR FASTING OR 2 HOURS AFTER A MEAL EVERY OTHER DAY., Disp: 100 each, Rfl: 3   allopurinol (ZYLOPRIM) 100 MG tablet, Take 100 mg by mouth daily., Disp: , Rfl:    amLODipine (NORVASC) 10 MG tablet, Take 1 tablet (10 mg total) by mouth daily., Disp: 90 tablet, Rfl: 3   aspirin EC 81 MG tablet, Take 81 mg by mouth daily. Swallow whole., Disp: , Rfl:    atorvastatin (LIPITOR) 80 MG tablet, Take 1 tablet by mouth once daily, Disp: 90 tablet, Rfl: 3   Blood Glucose Monitoring Suppl (ACCU-CHEK GUIDE ME) w/Device KIT, 1 each by Does not apply route., Disp: , Rfl:    carvedilol (COREG) 25 MG tablet, TAKE 1 TABLET BY MOUTH TWICE DAILY WITH A MEAL, Disp: 180 tablet, Rfl: 2   cyanocobalamin (VITAMIN B12) 500 MCG tablet, Take 500 mcg by mouth daily., Disp: , Rfl:    empagliflozin (JARDIANCE) 25 MG TABS tablet, Take 1 tablet (25 mg total) by mouth daily before breakfast., Disp: 30 tablet, Rfl: 2   glucose blood (ACCU-CHEK GUIDE) test strip, Check blood sugar once a day, Disp: 100 each, Rfl: 2   hydrALAZINE (APRESOLINE) 50 MG tablet, Take 1 tablet (50 mg total) by mouth 3 (three) times daily., Disp: 270 tablet, Rfl: 3   hydroxychloroquine (PLAQUENIL) 200 MG tablet, Take 400 mg by mouth daily., Disp: , Rfl:    hydroxypropyl methylcellulose / hypromellose (ISOPTO TEARS / GONIOVISC) 2.5 % ophthalmic solution, Place 2  drops into the left eye as needed for dry eyes., Disp: 15 mL, Rfl: 2   insulin aspart protamine - aspart (NOVOLOG MIX 70/30 FLEXPEN) (70-30) 100 UNIT/ML FlexPen, Upto 20 units before breakfast as directed, Disp: 15 mL, Rfl: 1   Insulin Pen Needle 31G X 5 MM MISC, Use for pen, Disp: 100 each, Rfl: 1   isosorbide mononitrate (IMDUR) 30 MG 24 hr tablet, Take 1 tablet (30 mg total) by mouth daily., Disp: 90 tablet, Rfl: 3   potassium chloride SA (KLOR-CON M) 20 MEQ tablet, Take 1 tablet by mouth once daily, Disp: 30 tablet, Rfl: 0  repaglinide (PRANDIN) 2 MG tablet, TAKE 1 TABLET BY MOUTH THREE TIMES DAILY BEFORE MEAL(S), Disp: 180 tablet, Rfl: 2 No current facility-administered medications for this visit.  Facility-Administered Medications Ordered in Other Visits:    sodium chloride flush (NS) 0.9 % injection 10 mL, 10 mL, Intravenous, PRN, Candise Che, Corene Cornea, MD   sodium chloride flush (NS) 0.9 % injection 10 mL, 10 mL, Intracatheter, Once PRN, Johney Maine, MD  Review of Systems:  Negative unless indicated in HPI.   Physical Exam: Vitals:   12/02/22 1051  BP: 130/80  Pulse: 70  Temp: 98.2 F (36.8 C)  TempSrc: Oral  SpO2: 99%  Weight: 181 lb 8 oz (82.3 kg)    Body mass index is 26.42 kg/m.   Physical Exam Vitals reviewed.  Constitutional:      Appearance: Normal appearance.  HENT:     Head: Normocephalic and atraumatic.  Eyes:     Conjunctiva/sclera: Conjunctivae normal.     Pupils: Pupils are equal, round, and reactive to light.  Cardiovascular:     Rate and Rhythm: Normal rate and regular rhythm.  Pulmonary:     Effort: Pulmonary effort is normal.     Breath sounds: Normal breath sounds.  Skin:    General: Skin is warm and dry.  Neurological:     General: No focal deficit present.     Mental Status: He is alert and oriented to person, place, and time.  Psychiatric:        Mood and Affect: Mood normal.        Behavior: Behavior normal.        Thought  Content: Thought content normal.        Judgment: Judgment normal.      Impression and Plan:  Uncontrolled type 2 diabetes mellitus with hyperglycemia (HCC) Assessment & Plan: A1c improving now off prednisone and started on insulin. Follows with endocrinology, Dr. Lucianne Muss.  Orders: -     POCT glycosylated hemoglobin (Hb A1C) -     Microalbumin / creatinine urine ratio; Future  Coronary artery disease involving native coronary artery of native heart without angina pectoris Assessment & Plan: Stable, no CP/SOB.   Primary hypertension Assessment & Plan: Well controlled on amlodipine, hydralazine, coreg and imdur.   Pure hypercholesterolemia Assessment & Plan: Well controlled. LDL 51 in 12/23.   Rheumatoid arthritis involving multiple sites, unspecified whether rheumatoid factor present [facility]) Assessment & Plan: Off prednisone for now.   Prostate cancer [facility]) Assessment & Plan: Followed by urology. PSA was undetectable in 2/24.   Chronic renal impairment, stage 3a (HCC) Assessment & Plan: Baseline Cr around 1.3-1.4.      Time spent:31 minutes reviewing chart, interviewing and examining patient and formulating plan of care.     Chaya Jan, MD [facility]

## 2022-12-02 NOTE — Assessment & Plan Note (Signed)
Baseline Cr around 1.3-1.4.

## 2022-12-02 NOTE — Assessment & Plan Note (Signed)
Followed by urology. PSA was undetectable in 2/24.

## 2022-12-02 NOTE — Assessment & Plan Note (Signed)
Stable, no CP/SOB.

## 2022-12-02 NOTE — Assessment & Plan Note (Signed)
Off prednisone for now.

## 2022-12-04 ENCOUNTER — Telehealth: Payer: Self-pay

## 2022-12-04 ENCOUNTER — Other Ambulatory Visit: Payer: Self-pay

## 2022-12-04 ENCOUNTER — Other Ambulatory Visit: Payer: Self-pay | Admitting: Endocrinology

## 2022-12-04 NOTE — Progress Notes (Unsigned)
Care Management & Coordination Services Pharmacy Note  FOCUSED DM Outreach  12/08/2022 Name:  [name] MRN:  [id number] DOB:  Sep 26, 1955  Summary: A1C not at goal <7 but just slightly elevated at 7.1 Confirmed patient has started Jardiance and is taking as instructed Pt reports he stopped prandin since starting Jardiance (combo made him have lows, feel weak and sweaty)  Recommendations/Changes made from today's visit: -Continue insulin injections at 10 units daily and Jardiance 25mg  daily -Reminded of f/u with endo on 6/14  Follow up plan: DM call in 2 months Pharmacist visit in 5 months   Subjective: [name] is an 67 y.o. year old male who is a primary patient of [name], [name], MD.  The care coordination team was consulted for assistance with disease management and care coordination needs.    Engaged with patient by telephone for initial visit.  Recent office visits: 09/03/2022 [name], [name], MD - Patient was seen for Uncontrolled type 2 diabetes mellitus with hyperglycemia and additional concerns. No medication changes.    06/04/2023 [name], [name], MD - Patient was seen for Rheumatoid arthritis involving multiple sites, unspecified whether rheumatoid factor present and primary hypertension. No medication changes.  Recent consult visits: 11/12/2022 [name] MD (endocrinology) - Patient was seen for Uncontrolled type 2 diabetes mellitus with hyperglycemia, without long-term current use of insulin and an additional concern. Started Novolog flexpen 70/30 inject 20 units before breakfast. Although will try to get him the patient assistance support for either [place] or Jardiance he likely needs to be on insulin at least for now Discussed how to use insulin and how this would be used with the insulin pen Will start with NovoLog mix 10 units daily in the morning and increase progressively until afternoon readings are below  150 He will be instructed by the diabetes educator Continue Prandin for now   11/04/2022 [name] (rheum) - Patient was seen for joint swelling and additional concerns. No additional chart notes.    09/10/2022 [name] (endo) - Patient was seen for Uncontrolled type 2 diabetes mellitus with hyperglycemia, without long-term current use of insulin. Started Jardiance 25 mg daily.    06/22/2022 [name] MD (card) - Patient was seen for Coronary artery disease involving coronary bypass graft of native heart without angina pectoris and additional concerns. Increased Amlodipine to 10 mg daily.    06/10/2022 [name] (endo) - Patient was seen for Uncontrolled type 2 diabetes mellitus with hyperglycemia, without long-term current use of insulin.No medication changes.   Hospital visits: None in previous 6 months   Objective:  Lab Results  Component Value Date   CREATININE 1.34 11/09/2022   BUN 24 (H) 11/09/2022   GFR 54.87 (L) 11/09/2022   GFRNONAA 50 (L) 05/07/2021   GFRAA 58 (L) 04/23/2020   NA 137 11/09/2022   K 3.8 11/09/2022   CALCIUM 8.9 11/09/2022   CO2 24 11/09/2022   GLUCOSE 236 (H) 11/09/2022    Lab Results  Component Value Date/Time   HGBA1C 7.1 (A) 12/02/2022 10:53 AM   HGBA1C 9.1 (H) 11/09/2022 11:05 AM   HGBA1C 10.5 (H) 09/07/2022 10:05 AM   FRUCTOSAMINE 297 (H) 07/22/2021 09:28 AM   FRUCTOSAMINE 347 (H) 04/14/2021 09:12 AM   GFR 54.87 (L) 11/09/2022 11:05 AM   GFR 48.37 (L) 09/07/2022 10:05 AM   MICROALBUR 12.6 (H) 07/21/2018 03:07 PM    Assessment/Plan Diabetes (A1c goal <7%) -Uncontrolled -Current medications: Jardiance 25mg  1 qd Appropriate,  Effective, Safe, Accessible Prandin 2mg  1 TID before meals -  stopped taking 2 days after starting Jardiance, states it dropped him too low and he felt sweaty and weak Novolog mix 70/30 up to 20 units daily as directed Appropriate, Query effective -Medications previously tried: Amaryl, Glipizide,  Metformin, Januvia -Current home glucose readings - checking fasting and 3 hours after a meal fasting glucose: 125-128 post prandial glucose: <180 -Denies hypoglycemic/hyperglycemic symptoms -Current meal patterns: 3 meals a day breakfast: [place] sausage with toast and a egg with grape jelly, cereal  lunch: go get a burger and fries from [employer], or lean [place] burger at home from [employer]  dinner: wife cooks, meat and green veggies (baked or boiled), potatoes snacks: debbie oatmeal cakes sometimes drinks: water, sometimes a glass of soda -Current exercise: just bought a treadmill for walking and does that 15-53min in mornings and evenings -Educated on A1c and blood sugar goals; Complications of diabetes including kidney damage, retinal damage, and cardiovascular disease; Exercise goal of 150 minutes per week; Prevention and management of hypoglycemic episodes; Continuous glucose monitoring; -Counseled to check feet daily and get yearly eye exams -Counseled on diet and exercise extensively --Continue insulin at 10 units daily and Jardiance 25mg  1 tab daily     Sherrill Raring Clinical Pharmacist [phone]

## 2022-12-04 NOTE — Progress Notes (Signed)
Care Management & Coordination Services Pharmacy Team  Reason for Encounter: Appointment Reminder  Contacted patient to confirm telephone appointment with Delano Metz, PharmD on 12/08/2022 at 8:30. Spoke with patient on 12/04/2022   Do you have any problems getting your medications? Patient denies  What is your top health concern you would like to discuss at your upcoming visit? Patient denies  Have you seen any other providers since your last visit with PCP? Patient denies  Care Gaps: AWV - never done Last eye exam - 07/21/2021 (pt plans to schedule with his doctor) Last foot exam - 10/30/2021 Last BP - 130/80 on 12/02/2022 Last A1C - 9.1 on 11/09/2022 Hep C Screen - never done Shingrix - never done Urine ACR - overdue Covid - overdue Pneumovax - postponed Colonoscopy - postponed   Star Rating Drugs: Atorvastatin 80 mg - last filled 11/05/2022 90 DS at [facility] Jardiance 25 mg - last filled 11/24/2022 30 DS at [facility]   Inetta Fermo [facility]  Clinical Pharmacist Assistant [phone]

## 2022-12-05 ENCOUNTER — Other Ambulatory Visit: Payer: Self-pay | Admitting: Endocrinology

## 2022-12-08 ENCOUNTER — Ambulatory Visit: Payer: Medicare Other

## 2022-12-25 ENCOUNTER — Ambulatory Visit (INDEPENDENT_AMBULATORY_CARE_PROVIDER_SITE_OTHER): Payer: Medicare Other | Admitting: Endocrinology

## 2022-12-25 ENCOUNTER — Encounter: Payer: Self-pay | Admitting: Endocrinology

## 2022-12-25 VITALS — BP 130/60 | HR 76 | Ht 69.0 in | Wt 187.8 lb

## 2022-12-25 DIAGNOSIS — E1165 Type 2 diabetes mellitus with hyperglycemia: Secondary | ICD-10-CM | POA: Diagnosis not present

## 2022-12-25 DIAGNOSIS — Z794 Long term (current) use of insulin: Secondary | ICD-10-CM

## 2022-12-25 DIAGNOSIS — N289 Disorder of kidney and ureter, unspecified: Secondary | ICD-10-CM

## 2022-12-25 LAB — BASIC METABOLIC PANEL
BUN: 21 mg/dL (ref 6–23)
CO2: 25 mEq/L (ref 19–32)
Calcium: 9 mg/dL (ref 8.4–10.5)
Chloride: 107 mEq/L (ref 96–112)
Creatinine, Ser: 1.74 mg/dL — ABNORMAL HIGH (ref 0.40–1.50)
GFR: 40.07 mL/min — ABNORMAL LOW (ref >60.00)
Glucose, Bld: 128 mg/dL — ABNORMAL HIGH (ref 70–99)
Potassium: 4.1 mEq/L (ref 3.5–5.1)
Sodium: 142 mEq/L (ref 135–145)

## 2022-12-25 NOTE — Patient Instructions (Signed)
Reduce insulin to 8

## 2022-12-25 NOTE — Progress Notes (Signed)
Patient ID: [name], male   DOB: January 22, 1956, 67 y.o.   MRN: [id number]           Reason for Appointment: Follow-up for Type 2 Diabetes   History of Present Illness:          Date of diagnosis of type 2 diabetes mellitus:   2018      Background history:  He has never been told to have diabetes but review of his previous labs indicate that he had high blood sugar 179 in 2014 His highest sugar has been 234 in 10/2016 when his A1c was 8.8, not clear if he was symptomatic at that time  Recent history:   A1c is last 7.1 compared to 9.1   Non-insulin hypoglycemic drugs the patient is taking are: Jardiance 25 mg daily  INSULIN regimen: NovoLog Mix 70/30, 10 units in the morning   Current management, blood sugar patterns and problems identified:  He was finally able to start on Jardiance just over a month ago with the help of patient assistance program Also after much hesitation he has started taking premixed insulin in the morning after instructions from nurse educator Unable to download his meter but he is mostly checking his sugars midday which are generally fairly good but he did have 1 episode of mild hypoglycemia with glucose 68 Has variable appetite and weight is fluctuating, also has history of some CHF He thinks he is fairly good with his diet and has eggs and meat at breakfast along with bread Not able to do any walking for exercise as before Previously taking Prandin but now he says that he was having nausea with this even though he had taken this consistently be previously not taking it           Glucose monitoring:   has Accu-Chek guide meter  Blood sugar data from meter review:  Blood sugars ranging from 68-1 73, only 1 reading later in the afternoon  Previous data:  PRE-MEAL Fasting Lunch Dinner Bedtime Overall  Glucose range: 128-197 197, 153 331    Mean/median:     181     Dietician visit, most recent: 2/21  Weight history:  Wt Readings from  Last 3 Encounters:  12/25/22 187 lb 12.8 oz (85.2 kg)  12/02/22 181 lb 8 oz (82.3 kg)  11/12/22 183 lb (83 kg)    Glycemic control:   Lab Results  Component Value Date   HGBA1C 7.1 (A) 12/02/2022   HGBA1C 9.1 (H) 11/09/2022   HGBA1C 10.5 (H) 09/07/2022   Lab Results  Component Value Date   MICROALBUR 12.6 (H) 07/21/2018   LDLCALC 51 06/16/2022   CREATININE 1.74 (H) 12/25/2022   Lab Results  Component Value Date   MICRALBCREAT 10.0 07/21/2018    Lab Results  Component Value Date   FRUCTOSAMINE 297 (H) 07/22/2021   FRUCTOSAMINE 347 (H) 04/14/2021   FRUCTOSAMINE 237 11/08/2020   Lab Results  Component Value Date   HGB 12.9 (L) 09/05/2021    Office Visit on 12/25/2022  Component Date Value Ref Range Status   Sodium 12/25/2022 142  135 - 145 mEq/L Final   Potassium 12/25/2022 4.1  3.5 - 5.1 mEq/L Final   Chloride 12/25/2022 107  96 - 112 mEq/L Final   CO2 12/25/2022 25  19 - 32 mEq/L Final   Glucose, Bld 12/25/2022 128 (H)  70 - 99 mg/dL Final   BUN 82/95/6213 21  6 - 23 mg/dL Final   Creatinine, Ser  12/25/2022 1.74 (H)  0.40 - 1.50 mg/dL Final   GFR 16/04/9603 40.07 (L)  >60.00 mL/min Final   Calculated using the CKD-EPI Creatinine Equation (2021)   Calcium 12/25/2022 9.0  8.4 - 10.5 mg/dL Final    Allergies as of 12/25/2022       Reactions   Metformin And Related Diarrhea        Medication List        Accurate as of December 25, 2022 11:59 PM. If you have any questions, ask your nurse or doctor.          STOP taking these medications    repaglinide 2 MG tablet Commonly known as: PRANDIN Stopped by: [name], MD       TAKE these medications    Accu-Chek Guide Me w/Device Kit 1 each by Does not apply route.   Accu-Chek Guide test strip Generic drug: glucose blood Check blood sugar once a day   Accu-Chek Softclix Lancets lancets USE ACCU CHEK SOFTCLIX LANCETS TO CHECK BLOOD SUGAR FASTING OR 2 HOURS AFTER A MEAL EVERY OTHER DAY.    allopurinol 100 MG tablet Commonly known as: ZYLOPRIM Take 100 mg by mouth daily.   amLODipine 10 MG tablet Commonly known as: NORVASC Take 1 tablet (10 mg total) by mouth daily.   aspirin EC 81 MG tablet Take 81 mg by mouth daily. Swallow whole.   atorvastatin 80 MG tablet Commonly known as: LIPITOR Take 1 tablet by mouth once daily   carvedilol 25 MG tablet Commonly known as: COREG TAKE 1 TABLET BY MOUTH TWICE DAILY WITH A MEAL   ciprofloxacin 500 MG tablet Commonly known as: CIPRO Take 500 mg by mouth 2 (two) times daily.   cyanocobalamin 500 MCG tablet Commonly known as: VITAMIN B12 Take 500 mcg by mouth daily.   dexamethasone 0.1 % ophthalmic suspension Commonly known as: DECADRON Apply 2 drops to the affected ear twice daily for 7 days.   empagliflozin 25 MG Tabs tablet Commonly known as: Jardiance Take 1 tablet (25 mg total) by mouth daily before breakfast.   hydrALAZINE 50 MG tablet Commonly known as: APRESOLINE Take 1 tablet (50 mg total) by mouth 3 (three) times daily.   hydroxychloroquine 200 MG tablet Commonly known as: PLAQUENIL Take 400 mg by mouth daily.   hydroxypropyl methylcellulose / hypromellose 2.5 % ophthalmic solution Commonly known as: ISOPTO TEARS / GONIOVISC Place 2 drops into the left eye as needed for dry eyes.   Insulin Pen Needle 31G X 5 MM Misc Use for pen   isosorbide mononitrate 30 MG 24 hr tablet Commonly known as: IMDUR Take 1 tablet (30 mg total) by mouth daily.   Klor-Con M20 20 MEQ tablet Generic drug: potassium chloride SA Take 1 tablet by mouth once daily   NovoLOG Mix 70/30 FlexPen (70-30) 100 UNIT/ML FlexPen Generic drug: insulin aspart protamine - aspart Upto 20 units before breakfast as directed        Allergies:  Allergies  Allergen Reactions   Metformin And Related Diarrhea    Past Medical History:  Diagnosis Date   Anemia    Benign localized prostatic hyperplasia with lower urinary tract  symptoms (LUTS)    CAD (coronary artery disease)    cardiologist--- dr Swaziland;   11-27-2020 cardiac cath for abnormal cardiolite,  severe 3V disease;   11-30-2000 s/p CABG x4;  cath 04-21-2005 patent SVG-OM1 & SVG --PDA graft's,  occluded radial graft -- OM, mod to sev LV dysfunction,  not candidate  for revascularization   Chronic diastolic CHF (congestive heart failure) (HCC)    followed by cardiologist   Chronic kidney disease (CKD), stage IV (severe) (HCC)    nephrologist--- dr Marisue Humble   (lov note scanned in [place] , epic, dated 09-26-2020)   Dilated cardiomyopathy (HCC)    echo in 2009 showed improvement with a normal EF   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 03-08-2021   (09-03-2021  per pt wife is resolving)   History of COVID-19    spring 2022 with mild symptoms that resolved   History of pulmonary embolism 03/13/2020   complete blood thinner   History of sepsis    hx admission for severe sepsis twice;  admitted 03-13-2020 secondary to aspiration pneumonia w/ ARF and PE;   admitted 10-07-2020 secondary to MSSA uti   History of stroke    per imaging 05-07-2021 head CT ,  remote infarcts left thalamus, basal ganglia   HTN (hypertension)    Hyperlipemia    Ischemic heart disease    cardiologist--- dr Swaziland;   Malignant neoplasm prostate [facility]) 05/2021   urologist-- dr eskridge/  oncologist--- dr Clelia Croft;  dx 11/ 2022,  Gleason 9, PSA 14   Multiple myeloma in remission [facility]) 2021   oncologist--- dr Candise Che--- dx 2021, IgG Kappa type, completed chemo 2021, in remission since   OA (osteoarthritis)    PAD (peripheral artery disease) (HCC)    RA (rheumatoid arthritis) [facility])    rheumatologist--- dr s. Corliss Skains---  multiple sites,  no other treatment other than takes tylenol arthritis with flare-ups   Tympanic membrane central perforation, left    followed by dr bates (ent)  chronic since 08/ 2022   (09-03-2021  per pt wife has partially closed)   Type 2 diabetes  [facility])    endocrinologist--- dr a Lucianne Muss   Uses walker    Wears glasses     Past Surgical History:  Procedure Laterality Date   CARDIAC CATHETERIZATION  11/27/2000   @[facility]  by Dr Swaziland;   severe 3V disease,  ef 15-25%   CARDIAC CATHETERIZATION  04/21/2005   by Dr Swaziland;    patnet SVG -- OM1 & SVG --PDA grafts,  occluded radial graft --OM1,  atretic LIMA -- Diag,  moderate to severe LV dysfunction,  not candidate for revascularization   CATARACT EXTRACTION W/ INTRAOCULAR LENS IMPLANT Right 08/21/2021   CORONARY ARTERY BYPASS GRAFT  11/30/2000   @[facility]  by Dr Morton Peters;   LIMA -- Diag,  radial artery -- OM1,  SVG -- OM,  SVG -- RCA   GOLD SEED IMPLANT N/A 09/05/2021   Procedure: GOLD SEED IMPLANT;  Surgeon: Jerilee Field, MD;  Location: [facility];  Service: Urology;  Laterality: N/A;  45 MINS FOR CASE   IR FLUORO GUIDED NEEDLE PLC ASPIRATION/INJECTION LOC  10/24/2019   SPACE OAR INSTILLATION N/A 09/05/2021   Procedure: SPACE OAR INSTILLATION;  Surgeon: Jerilee Field, MD;  Location: [facility];  Service: Urology;  Laterality: N/A;   TRANSURETHRAL RESECTION OF PROSTATE N/A 01/06/2021   Procedure: CYSTOSCOPY WITH CYSTOGRAM/TRANSURETHRAL RESECTION OF THE PROSTATE (TURP);  Surgeon: Crista Elliot, MD;  Location: [facility];  Service: Urology;  Laterality: N/A;    Family History  Problem Relation Age of Onset   Hypertension Father    Diabetes Mother        DIABETIC COMA   Healthy Daughter     Social History:  reports  that he has never smoked. He has never used smokeless tobacco. He reports that he does not currently use alcohol. He reports that he does not use drugs.   Review of Systems   Lipid history: On atorvastatin 80 mg from his cardiologist since diagnosis of CAD, labs as follows    Lab Results  Component Value Date   CHOL 116 06/16/2022   HDL 42 06/16/2022   LDLCALC 51 06/16/2022   LDLDIRECT 50.0 06/08/2022   TRIG 128 06/16/2022    CHOLHDL 2.8 06/16/2022           Hypertension: Has been followed by cardiologist for blood pressure management Blood pressure is not high  Aldosterone level has been checked for his history of hypokalemia and was normal  Recent readings:  BP Readings from Last 3 Encounters:  12/25/22 130/60  12/02/22 130/80  11/12/22 132/78   RENAL dysfunction:  Renal dysfunction: not related to diabetes No recent labs available No history of microalbuminuria  Lab Results  Component Value Date   CREATININE 1.74 (H) 12/25/2022   CREATININE 1.34 11/09/2022   CREATININE 1.49 09/07/2022   Lab Results  Component Value Date   K 4.1 12/25/2022    Most recent eye exam was in 9/22  Most recent foot exam: 4/23   Physical Examination:  BP 130/60 (BP Location: Left Arm, Patient Position: Sitting, Cuff Size: Large)   Pulse 76   Ht 5\' 9"  (1.753 m)   Wt 187 lb 12.8 oz (85.2 kg)   SpO2 95%   BMI 27.73 kg/m          ASSESSMENT:  Diabetes type 2, nonobese  See history of present illness for detailed discussion of current diabetes management, blood sugar patterns and problems identified  A1c is 7.1 compared to as high as 10.5  Currently treated with premixed insulin and Jardiance with marked improvement in blood sugars  He has been able to start insulin without difficulty and appears to be compliant with it Blood sugar monitoring somewhat inadequate but his blood sugars appear to be fairly good when checked with only 1 mild episode of hypoglycemia  no recent labs available   PLAN:     Check renal functions today Reduce insulin to 8 units to avoid hypoglycemia Make sure he is eating meals on time Discussed symptoms and treatment of hypoglycemia and patient handout given He will let us know if he has any further hypoglycemia Leave off Prandin for now  Patient Instructions  Reduce insulin to 8   Hafiz Irion 12/26/2022, 10:10 AM   Note: This office note was prepared with  Dragon voice recognition system technology. Any transcriptional errors that result from this process are unintentional.  Addendum: Creatinine is higher than usual at 1.74, we will reduce Jardiance to half tablet daily and increase fluids  [name]

## 2023-01-10 ENCOUNTER — Other Ambulatory Visit: Payer: Self-pay | Admitting: Endocrinology

## 2023-02-01 ENCOUNTER — Telehealth: Payer: Self-pay

## 2023-02-01 NOTE — Progress Notes (Addendum)
   02/01/2023  Patient ID: [name], male   DOB: 04-06-1956, 67 y.o.   MRN: [id number]  Telephone call from patient regarding medication affordability.  -Requesting assistance with Novolog Mix 70/30 Flexpen cost, as a 90 day supply is costing $105 -Patient states he has a few pens left and is currently using 8 units daily before breakfast  -He endorses ability to afford a 1 month supply if needed until we can get PAP approved -Patient currently has PAP for Farxiga through [employer] and would qualify for PAP through [employer] for Novolog 70/30 -Coordinating with medication assistance team to initiate application process for PAP with [employer] for Novolog  70/30 taking 8 units daily and pen needles  Will monitor progress and keep patient/provider notified.  Lenna Gilford, PharmD, DPLA

## 2023-02-03 ENCOUNTER — Other Ambulatory Visit (HOSPITAL_COMMUNITY): Payer: Self-pay

## 2023-02-03 ENCOUNTER — Telehealth: Payer: Self-pay

## 2023-02-03 NOTE — Telephone Encounter (Signed)
Submitted application E-FILED for NOVOLOG MIX 70/30  to [employer] for patient assistance PROCESSING.   Phone: [phone]  PLEASE BE ADVISED  Melanee Spry CPhT [facility] [phone]

## 2023-02-03 NOTE — Telephone Encounter (Signed)
-----   Message from Lenna Gilford sent at 02/02/2023  2:29 PM EDT ----- Good afternoon!  Mr. Delcastillo would qualify for PAP through [employer] for his Novolog 70/30 using 8 units daily and pen needles.  Would you all mind to initiate this application process for him?  Thank you!  Lenna Gilford, PharmD, DPLA

## 2023-02-08 ENCOUNTER — Other Ambulatory Visit: Payer: Self-pay | Admitting: Endocrinology

## 2023-02-11 ENCOUNTER — Encounter: Payer: Self-pay | Admitting: Hematology

## 2023-02-11 ENCOUNTER — Other Ambulatory Visit (HOSPITAL_COMMUNITY): Payer: Self-pay

## 2023-02-11 NOTE — Telephone Encounter (Signed)
Received notification from [employer] regarding patient assistance PENDING STATUS for NOVOLOG MIX 70/30 .   DUE TO MISSING A COPY OF INSURANCE CARD.  PLEASE BE ADVISED I LEFT VM ASKING PT TO GIVE ME A CALL BACK.

## 2023-02-12 ENCOUNTER — Other Ambulatory Visit (HOSPITAL_COMMUNITY): Payer: Self-pay

## 2023-02-12 ENCOUNTER — Encounter: Payer: Self-pay | Admitting: Hematology

## 2023-02-16 NOTE — Telephone Encounter (Signed)
RECEIVE PT INSURANCE CARD WAS SCANNED IN TO MEDIA OF CHART AND I SENT A COPY TO COMPANY TO PROCESS PATIENT ASSISTANCE FOR NOVOLOG 70/30 FROM [employer]  PLEASE BE ADVISED

## 2023-02-17 ENCOUNTER — Other Ambulatory Visit: Payer: Self-pay | Admitting: Cardiology

## 2023-02-18 ENCOUNTER — Other Ambulatory Visit: Payer: Self-pay | Admitting: Endocrinology

## 2023-02-18 ENCOUNTER — Other Ambulatory Visit (INDEPENDENT_AMBULATORY_CARE_PROVIDER_SITE_OTHER): Payer: Medicare Other

## 2023-02-18 DIAGNOSIS — Z794 Long term (current) use of insulin: Secondary | ICD-10-CM

## 2023-02-18 DIAGNOSIS — E119 Type 2 diabetes mellitus without complications: Secondary | ICD-10-CM | POA: Diagnosis not present

## 2023-02-18 LAB — BASIC METABOLIC PANEL
BUN: 19 mg/dL (ref 6–23)
CO2: 25 mEq/L (ref 19–32)
Calcium: 9.3 mg/dL (ref 8.4–10.5)
Chloride: 108 mEq/L (ref 96–112)
Creatinine, Ser: 1.46 mg/dL (ref 0.40–1.50)
GFR: 49.41 mL/min — ABNORMAL LOW (ref >60.00)
Glucose, Bld: 207 mg/dL — ABNORMAL HIGH (ref 70–99)
Potassium: 3.7 mEq/L (ref 3.5–5.1)
Sodium: 141 mEq/L (ref 135–145)

## 2023-02-19 ENCOUNTER — Other Ambulatory Visit: Payer: Self-pay | Admitting: Endocrinology

## 2023-02-19 DIAGNOSIS — E1165 Type 2 diabetes mellitus with hyperglycemia: Secondary | ICD-10-CM

## 2023-02-19 NOTE — Telephone Encounter (Signed)
PAP: Patient assistance application for Humalog Mix 70/30 has been approved by PAP Companies: [employer] from 02/13/2023 to 07/13/2023. Medication should be delivered to PAP Delivery: Provider's office For further shipping updates, please contact [employer] at [phone] Pt ID is:    Please be advised pt has been contacted and have been made aware of approval.

## 2023-02-22 ENCOUNTER — Telehealth: Payer: Self-pay

## 2023-02-23 NOTE — Progress Notes (Signed)
   02/23/2023  Patient ID: [name], male   DOB: [birth year], 67 y.o.   MRN: [id number]  Contacting [employer] P[employer] to check on the processing status for Mr. Gaddis Novolog Mix 70/30 flex pen application.  This has been approved and should arrive at [facility] within 7-10 business days.  Patient has been sent a MyChart message to make him aware and was provided with my direct number if this is not received in this time frame or if other needs arise.    Lenna Gilford, PharmD, DPLA

## 2023-02-25 ENCOUNTER — Ambulatory Visit (INDEPENDENT_AMBULATORY_CARE_PROVIDER_SITE_OTHER): Payer: Medicare Other | Admitting: Endocrinology

## 2023-02-25 ENCOUNTER — Encounter: Payer: Self-pay | Admitting: Endocrinology

## 2023-02-25 VITALS — BP 100/65 | HR 59 | Ht 69.0 in | Wt 186.4 lb

## 2023-02-25 DIAGNOSIS — Z794 Long term (current) use of insulin: Secondary | ICD-10-CM

## 2023-02-25 DIAGNOSIS — E1169 Type 2 diabetes mellitus with other specified complication: Secondary | ICD-10-CM | POA: Diagnosis not present

## 2023-02-25 LAB — POCT GLYCOSYLATED HEMOGLOBIN (HGB A1C): Hemoglobin A1C: 6.7 % — AB (ref 4.0–5.6)

## 2023-02-25 MED ORDER — EMPAGLIFLOZIN 25 MG PO TABS: ORAL_TABLET | ORAL | 3 refills | Status: DC

## 2023-02-25 MED ORDER — NOVOLOG MIX 70/30 FLEXPEN (70-30) 100 UNIT/ML ~~LOC~~ SUPN: 8.0000 [IU] | PEN_INJECTOR | Freq: Every day | SUBCUTANEOUS | 4 refills | Status: AC

## 2023-02-25 NOTE — Patient Instructions (Signed)
Diabetes regimen:  Continue same, no change. Novolog mix 70/30: 8 units with breakfast.  Jardiance 1/2 tab daily.

## 2023-02-25 NOTE — Progress Notes (Signed)
Outpatient Endocrinology Note [place] , MD  02/25/23  Patient's Name: [name]    DOB: 02/16/1956    MRN: [id number]                                                    REASON OF VISIT: Follow up for type 2 diabetes mellitus  PCP: [name], [name], MD  HISTORY OF PRESENT ILLNESS:   [name] is a 67 y.o. old male with past medical history listed below, is here for follow up of type 2 diabetes mellitus.   Pertinent Diabetes History: He was diagnosed with type 2 diabetes mellitus in 2018.  Hemoglobin A1c was 8.8% at the time of diagnosis.  Chronic Diabetes Complications : Retinopathy: no. Last ophthalmology exam was done on annually, reportedly. Nephropathy: CKD Peripheral neuropathy: no Coronary artery disease: yes Stroke: yes  Relevant comorbidities and cardiovascular risk factors: Obesity: no Body mass index is 27.53 kg/m.  Hypertension: yes Hyperlipidemia. Yes, on statin  Current / Home Diabetic regimen includes: NovoLog Mix 70/30 : 8 units in the morning daily. Jardiance 12.5 mg daily.  Prior diabetic medications: Jardiance was decreased from 25 mg due to elevated creatinine. Prandin, stopped due to nausea.  Glycemic data:   He forgot to bring the glucometer in the clinic today.  He recalls having blood sugar about 1 40-1 50 range.  He has been checking mostly in the morning fasting.  He has Accu-Chek guide meter.  Hypoglycemia: Patient has no hypoglycemic episodes. Patient has hypoglycemia awareness.  Interval history 02/25/23 Hemoglobin A1c today 6.7%.  He denies hypoglycemia.  He has been taking NovoLog Mix 80 units in the morning and Jardiance half tablet.  No complaints today.  Recent labs reviewed he has improvement on creatinine after decreasing dose of Jardiance.  REVIEW OF SYSTEMS As per history of present illness.   PAST MEDICAL HISTORY: Past Medical History:  Diagnosis Date   Anemia    Benign localized prostatic  hyperplasia with lower urinary tract symptoms (LUTS)    CAD (coronary artery disease)    cardiologist--- dr Swaziland;   11-27-2020 cardiac cath for abnormal cardiolite,  severe 3V disease;   11-30-2000 s/p CABG x4;  cath 04-21-2005 patent SVG-OM1 & SVG --PDA graft's,  occluded radial graft -- OM, mod to sev LV dysfunction,  not candidate for revascularization   Chronic diastolic CHF (congestive heart failure) (HCC)    followed by cardiologist   Chronic kidney disease (CKD), stage IV (severe) [facility])    nephrologist--- dr Marisue Humble   (lov note scanned in [place] , epic, dated 09-26-2020)   Dilated cardiomyopathy (HCC)    echo in 2009 showed improvement with a normal EF   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 03-08-2021   (09-03-2021  per pt wife is resolving)   History of COVID-19    spring 2022 with mild symptoms that resolved   History of pulmonary embolism 03/13/2020   complete blood thinner   History of sepsis    hx admission for severe sepsis twice;  admitted 03-13-2020 secondary to aspiration pneumonia w/ ARF and PE;   admitted 10-07-2020 secondary to MSSA uti   History of stroke    per imaging 05-07-2021 head CT ,  remote infarcts left thalamus, basal ganglia   HTN (hypertension)  Hyperlipemia    Ischemic heart disease    cardiologist--- dr Swaziland;   Malignant neoplasm prostate [facility]) 05/2021   urologist-- dr eskridge/  oncologist--- dr Clelia Croft;  dx 11/ 2022,  Gleason 9, PSA 14   Multiple myeloma in remission [facility]) 2021   oncologist--- dr Candise Che--- dx 2021, IgG Kappa type, completed chemo 2021, in remission since   OA (osteoarthritis)    PAD (peripheral artery disease) (HCC)    RA (rheumatoid arthritis) [facility])    rheumatologist--- dr s. Corliss Skains---  multiple sites,  no other treatment other than takes tylenol arthritis with flare-ups   Tympanic membrane central perforation, left    followed by dr bates (ent)  chronic since 08/ 2022   (09-03-2021  per pt wife has  partially closed)   Type 2 diabetes mellitus [facility])    endocrinologist--- dr a Lucianne Muss   Uses walker    Wears glasses     PAST SURGICAL HISTORY: Past Surgical History:  Procedure Laterality Date   CARDIAC CATHETERIZATION  11/27/2000   @[facility]  by Dr Swaziland;   severe 3V disease,  ef 15-25%   CARDIAC CATHETERIZATION  04/21/2005   by Dr Swaziland;    patnet SVG -- OM1 & SVG --PDA grafts,  occluded radial graft --OM1,  atretic LIMA -- Diag,  moderate to severe LV dysfunction,  not candidate for revascularization   CATARACT EXTRACTION W/ INTRAOCULAR LENS IMPLANT Right 08/21/2021   CORONARY ARTERY BYPASS GRAFT  11/30/2000   @[facility]  by Dr Morton Peters;   LIMA -- Diag,  radial artery -- OM1,  SVG -- OM,  SVG -- RCA   GOLD SEED IMPLANT N/A 09/05/2021   Procedure: GOLD SEED IMPLANT;  Surgeon: Jerilee Field, MD;  Location: [facility];  Service: Urology;  Laterality: N/A;  45 MINS FOR CASE   IR FLUORO GUIDED NEEDLE PLC ASPIRATION/INJECTION LOC  10/24/2019   SPACE OAR INSTILLATION N/A 09/05/2021   Procedure: SPACE OAR INSTILLATION;  Surgeon: Jerilee Field, MD;  Location: [facility];  Service: Urology;  Laterality: N/A;   TRANSURETHRAL RESECTION OF PROSTATE N/A 01/06/2021   Procedure: CYSTOSCOPY WITH CYSTOGRAM/TRANSURETHRAL RESECTION OF THE PROSTATE (TURP);  Surgeon: Crista Elliot, MD;  Location: [facility];  Service: Urology;  Laterality: N/A;    ALLERGIES: Allergies  Allergen Reactions   Metformin And Related Diarrhea    FAMILY HISTORY:  Family History  Problem Relation Age of Onset   Hypertension Father    Diabetes Mother        DIABETIC COMA   Healthy Daughter     SOCIAL HISTORY: Social History   Socioeconomic History   Marital status: Married    Spouse name: Not on file   Number of children: 1   Years of education: Not on file   Highest education level: 12th grade  Occupational History   Occupation: Chartered certified accountant  Tobacco Use   Smoking status: Never    Smokeless tobacco: Never  Vaping Use   Vaping status: Never Used  Substance and Sexual Activity   Alcohol use: Not Currently   Drug use: Never   Sexual activity: Yes  Other Topics Concern   Not on file  Social History Narrative   ** Merged History Encounter **       Social Determinants of Health   Financial Resource Strain: Low Risk  (05/07/2021)   Overall Financial Resource Strain (CARDIA)    Difficulty of Paying Living Expenses: Not hard at all  Food Insecurity: No Food Insecurity (12/08/2022)  Hunger Vital Sign    Worried About Running Out of Food in the Last Year: Never true    Ran Out of Food in the Last Year: Never true  Transportation Needs: No Transportation Needs (06/01/2022)   PRAPARE - Administrator, Civil Service (Medical): No    Lack of Transportation (Non-Medical): No  Physical Activity: Unknown (05/07/2021)   Exercise Vital Sign    Days of Exercise per Week: 0 days    Minutes of Exercise per Session: Not on file  Stress: No Stress Concern Present (05/07/2021)   [employer] of Occupational Health - Occupational Stress Questionnaire    Feeling of Stress : Not at all  Social Connections: Unknown (06/01/2022)   Social Connection and Isolation Panel [NHANES]    Frequency of Communication with Friends and Family: Three times a week    Frequency of Social Gatherings with Friends and Family: Once a week    Attends Religious Services: More than 4 times per year    Active Member of [employer] or Organizations: Not on file    Attends Banker Meetings: Not on file    Marital Status: Married    MEDICATIONS:  Current Outpatient Medications  Medication Sig Dispense Refill   Accu-Chek Softclix Lancets lancets USE ACCU CHEK SOFTCLIX LANCETS TO CHECK BLOOD SUGAR FASTING OR 2 HOURS AFTER A MEAL EVERY OTHER DAY. 100 each 3   allopurinol (ZYLOPRIM) 100 MG tablet Take 100 mg by mouth daily.     amLODipine (NORVASC) 10 MG tablet Take 1 tablet (10 mg  total) by mouth daily. 90 tablet 3   aspirin EC 81 MG tablet Take 81 mg by mouth daily. Swallow whole.     atorvastatin (LIPITOR) 80 MG tablet Take 1 tablet (80 mg total) by mouth daily. 90 tablet 3   Blood Glucose Monitoring Suppl (ACCU-CHEK GUIDE ME) w/Device KIT 1 each by Does not apply route.     carvedilol (COREG) 25 MG tablet TAKE 1 TABLET BY MOUTH TWICE DAILY WITH A MEAL 180 tablet 2   cyanocobalamin (VITAMIN B12) 500 MCG tablet Take 500 mcg by mouth daily.     glucose blood (ACCU-CHEK GUIDE) test strip USE 1  TO CHECK GLUCOSE ONCE DAILY 100 each 0   hydrALAZINE (APRESOLINE) 50 MG tablet Take 1 tablet (50 mg total) by mouth 3 (three) times daily. 270 tablet 3   hydroxychloroquine (PLAQUENIL) 200 MG tablet Take 400 mg by mouth daily.     hydroxypropyl methylcellulose / hypromellose (ISOPTO TEARS / GONIOVISC) 2.5 % ophthalmic solution Place 2 drops into the left eye as needed for dry eyes. 15 mL 2   Insulin Pen Needle 31G X 5 MM MISC Use for pen 100 each 1   isosorbide mononitrate (IMDUR) 30 MG 24 hr tablet Take 1 tablet (30 mg total) by mouth daily. 90 tablet 3   KLOR-CON M20 20 MEQ tablet Take 1 tablet by mouth once daily 30 tablet 0   ciprofloxacin (CIPRO) 500 MG tablet Take 500 mg by mouth 2 (two) times daily. (Patient not taking: Reported on 02/25/2023)     dexamethasone (DECADRON) 0.1 % ophthalmic suspension Apply 2 drops to the affected ear twice daily for 7 days. (Patient not taking: Reported on 02/25/2023)     empagliflozin (JARDIANCE) 25 MG TABS tablet Take half tablet daily. 45 tablet 3   insulin aspart protamine - aspart (NOVOLOG MIX 70/30 FLEXPEN) (70-30) 100 UNIT/ML FlexPen Inject 8 Units into the skin daily with breakfast. Upto  20 units before breakfast as directed 6 mL 4   No current facility-administered medications for this visit.   Facility-Administered Medications Ordered in Other Visits  Medication Dose Route Frequency Provider Last Rate Last Admin   sodium chloride  flush (NS) 0.9 % injection 10 mL  10 mL Intravenous PRN Johney Maine, MD       sodium chloride flush (NS) 0.9 % injection 10 mL  10 mL Intracatheter Once PRN Johney Maine, MD        PHYSICAL EXAM: Vitals:   02/25/23 0957  BP: 100/65  Pulse: (!) 59  SpO2: 96%  Weight: 186 lb 6.4 oz (84.6 kg)  Height: 5\' 9"  (1.753 m)   Body mass index is 27.53 kg/m.  Wt Readings from Last 3 Encounters:  02/25/23 186 lb 6.4 oz (84.6 kg)  12/25/22 187 lb 12.8 oz (85.2 kg)  12/02/22 181 lb 8 oz (82.3 kg)    General: Well developed, well nourished male in no apparent distress.  HEENT: AT/[place], no external lesions.  Eyes: Conjunctiva clear and no icterus. Neck: Neck supple  Lungs: Respirations not labored Neurologic: Alert, oriented, normal speech Extremities / Skin: Dry. No sores or rashes noted. No acanthosis nigricans Psychiatric: Does not appear depressed or anxious  Diabetic Foot Exam - Simple   No data filed    LABS Reviewed Lab Results  Component Value Date   HGBA1C 6.7 (A) 02/25/2023   HGBA1C 7.1 (A) 12/02/2022   HGBA1C 9.1 (H) 11/09/2022   Lab Results  Component Value Date   FRUCTOSAMINE 297 (H) 07/22/2021   FRUCTOSAMINE 347 (H) 04/14/2021   FRUCTOSAMINE 237 11/08/2020   Lab Results  Component Value Date   CHOL 116 06/16/2022   HDL 42 06/16/2022   LDLCALC 51 06/16/2022   LDLDIRECT 50.0 06/08/2022   TRIG 128 06/16/2022   CHOLHDL 2.8 06/16/2022   Lab Results  Component Value Date   MICRALBCREAT 10.0 07/21/2018   Lab Results  Component Value Date   CREATININE 1.46 02/18/2023   Lab Results  Component Value Date   GFR 49.41 (L) 02/18/2023    ASSESSMENT / PLAN  1. Type 2 diabetes mellitus with other specified complication, with long-term current use of insulin (HCC)     Diabetes Mellitus type 2, complicated by CAD/ CKD - Diabetic status / severity: Controlled.  Lab Results  Component Value Date   HGBA1C 6.7 (A) 02/25/2023    - Hemoglobin A1c  goal : <7%  - Medications: No change.  I) continue NovoLog Mix 70/30 : 8 units in the morning. II) continue Jardiance 12.5 mg daily.   - Home glucose testing: At least 2 times a day in the morning and at bedtime. - Discussed/ Gave Hypoglycemia treatment plan.  # Consult : not required at this time.   # Annual urine for microalbuminuria/ creatinine ratio, no microalbuminuria currently.   Last  Lab Results  Component Value Date   MICRALBCREAT 10.0 07/21/2018    # Foot check nightly / neuropathy.  # Annual dilated diabetic eye exams.   - Diet: Make healthy diabetic food choices - Life style / activity / exercise: Discussed as tolerated.  2. Blood pressure  -  BP Readings from Last 1 Encounters:  02/25/23 100/65    - Control is in target.  - No change in current plans.  3. Lipid status / Hyperlipidemia - Last  Lab Results  Component Value Date   LDLCALC 51 06/16/2022   - Continue atorvastatin 80 mg  daily.  Diagnoses and all orders for this visit:  Type 2 diabetes mellitus with other specified complication, with long-term current use of insulin (HCC) -     POCT glycosylated hemoglobin (Hb A1C)  Other orders -     empagliflozin (JARDIANCE) 25 MG TABS tablet; Take half tablet daily. -     insulin aspart protamine - aspart (NOVOLOG MIX 70/30 FLEXPEN) (70-30) 100 UNIT/ML FlexPen; Inject 8 Units into the skin daily with breakfast. Upto 20 units before breakfast as directed    DISPOSITION Follow up in clinic in 3 months suggested.   All questions answered and patient verbalized understanding of the plan.  [place] , MD [facility] Endocrinology [facility] [address] [place], [address] [place], [place] [address] Phone # [phone]  At least part of this note was generated using voice recognition software. Inadvertent word errors may have occurred, which were not recognized during the proofreading process.

## 2023-03-01 NOTE — Telephone Encounter (Signed)
Patient picked up 2 boxes of Novo Fine Pens 32g needles / Patient Assistance 03/01/23

## 2023-03-07 ENCOUNTER — Other Ambulatory Visit: Payer: Self-pay | Admitting: Endocrinology

## 2023-03-11 ENCOUNTER — Telehealth: Payer: Self-pay

## 2023-03-11 NOTE — Progress Notes (Signed)
   03/11/2023  Patient ID: [name], male   DOB: 07-02-1956, 67 y.o.   MRN: [id number]  Received notification from medication assistance team that patient only received pen needles from [facility] [facility] when he came to pick up 8/19.  Patient should have received Novolog Mix 70/30 Flexpen in addition to the pen needles.  PAP states pen needles and insulin were delivered, but the Novolog Mix 70/30 was not accepted by the facility and returned to them.  Contacting PCP office to clarify this information and see if they have any samples the patient can get until this can be sent back out from [facility].  Lenna Gilford, PharmD, DPLA

## 2023-03-11 NOTE — Telephone Encounter (Signed)
Pt called, requesting a call back at your earliest convenience.

## 2023-03-11 NOTE — Progress Notes (Addendum)
   03/11/2023  Patient ID: [name], male   DOB: 09-Jun-1956, 67 y.o.   MRN: [id number]  PCP office does not have any Novolog Mix 70/30 flexpen samples, nor are they aware of anyone denying the delivery.   -Upon further investigation, this and pen needles are prescribed by endocrinology; and Novolog was shipped 8/24 to Dr. Remus Blake office -Medication likely returned to [employer] by [employer] since this was a Saturday; and no one was there to receive packagea -[facility] is unable to issue a 30 day voucher since this was not a shipping delay on their part.   -Contacted the patient, and he just picked up 2 pens which will last him at least 1 month -Once return is received by [facility] they can begin processing another fill to go out to him;  I will contact them next week to check on the status -UPS tracking number for reference: [id number]  Lenna Gilford, PharmD, DPLA

## 2023-03-19 ENCOUNTER — Telehealth: Payer: Self-pay

## 2023-03-19 NOTE — Progress Notes (Signed)
   03/19/2023  Patient ID: [name], male   DOB: [birth year], 67 y.o.   MRN: [id number]  Contacted [facility] to see if Novlog 70/30 recently returned to program has been received, so we can request processing of refill.  Product has not been received from [employer] yet, but [facility] was able to go ahead process a refill for the patient.  This should arrive at Dr. Remus Blake office in 10-14 days.  Sending patient a MyChart message to make him aware.  Lenna Gilford, PharmD, DPLA

## 2023-03-25 ENCOUNTER — Telehealth: Payer: Self-pay

## 2023-03-25 IMAGING — CT CT HEAD W/O CM
3 series · 14 of 47 positions shown, 16 images · non-contrast
Comparison: 03/19/2005

CLINICAL DATA: Neurologic deficit.

EXAM:
CT HEAD WITHOUT CONTRAST
TECHNIQUE: Contiguous axial images were obtained from the base of the skull
through the vertex without intravenous contrast.

[Series 2: head wo · axial · 0.47mm/px · z∈[-377,-232]mm · 8 of 35 slices shown, 10 images]
[im 3/35  brain]
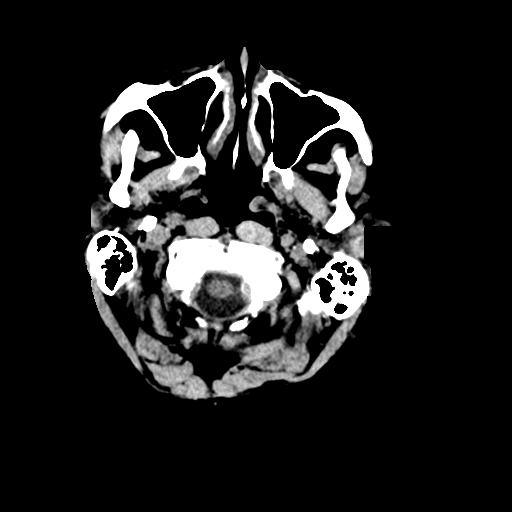
[im 3/35  bone]
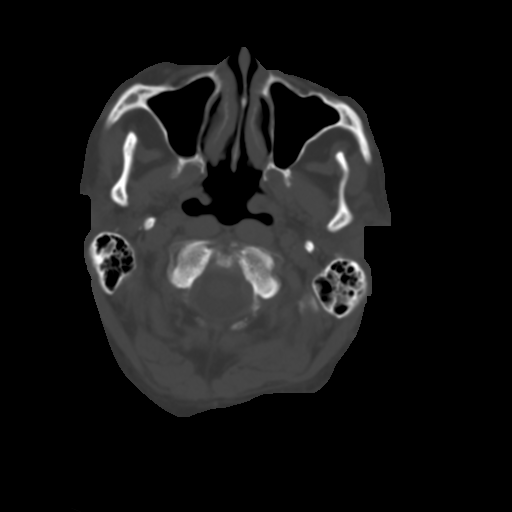
[im 8/35  brain]
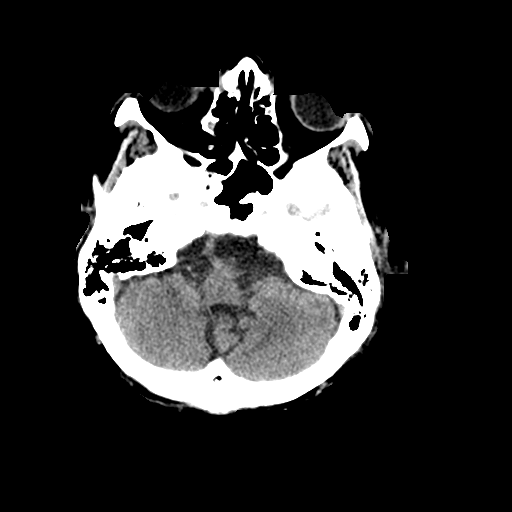
[im 11/35  brain]
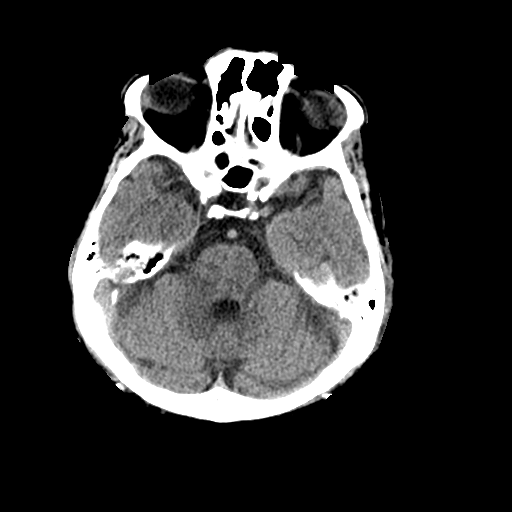
[im 16/35  brain]
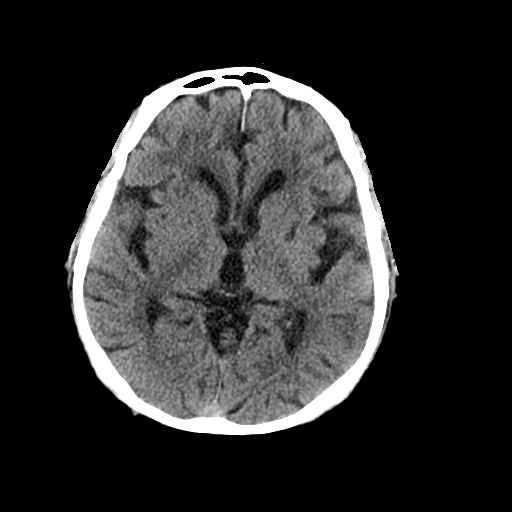
[im 19/35  brain]
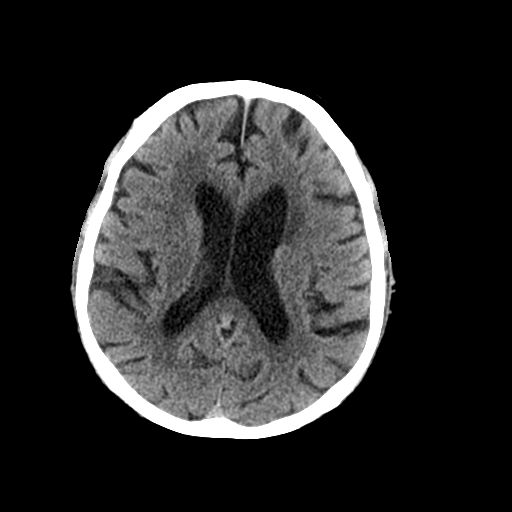
[im 19/35  bone]
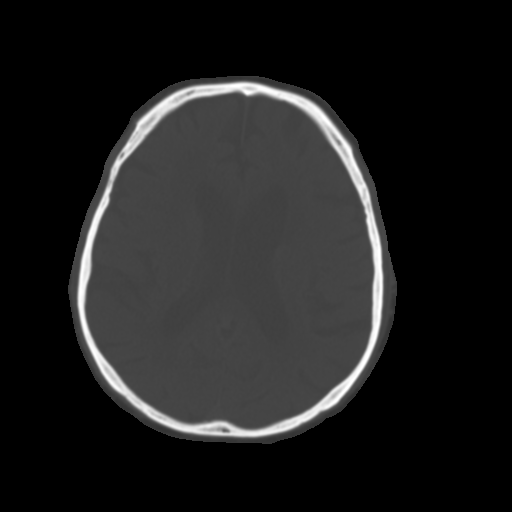
[im 24/35  brain]
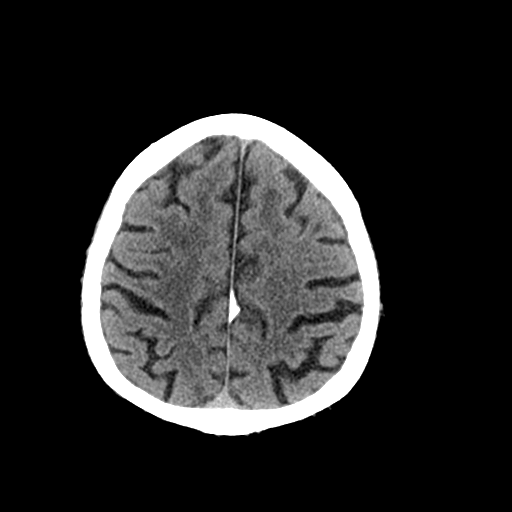
[im 27/35  brain]
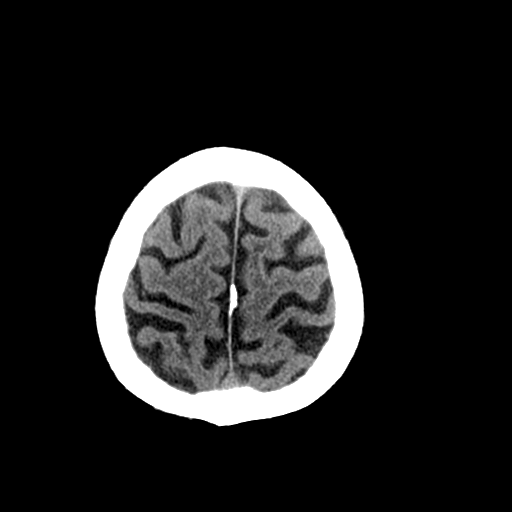
[im 32/35  brain]
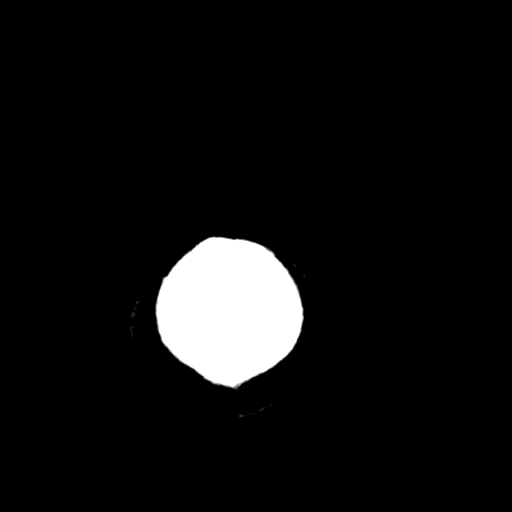

[Series 5: coronal soft tissue · coronal · 0.34mm/px · 3 of 66 slices shown]
[im 22/66  brain]
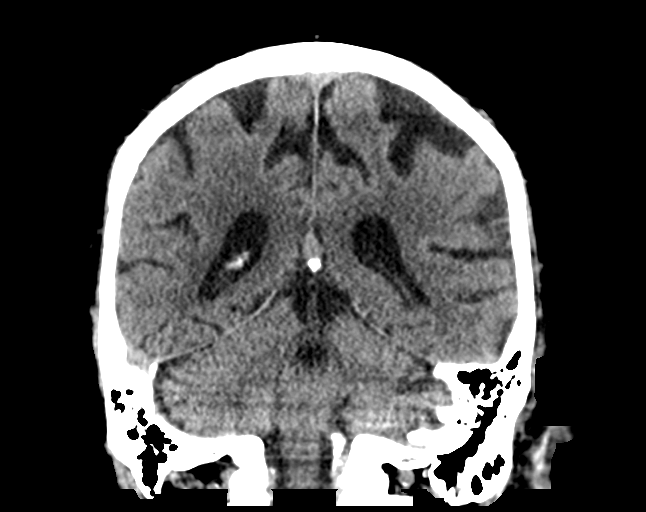
[im 29/66  brain]
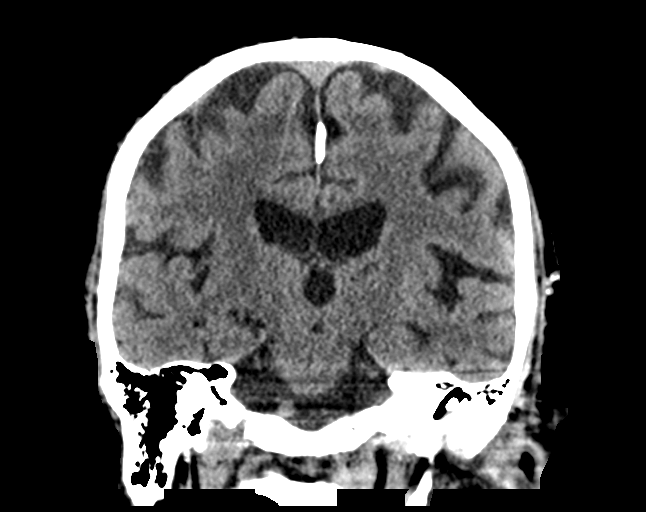
[im 37/66  brain]
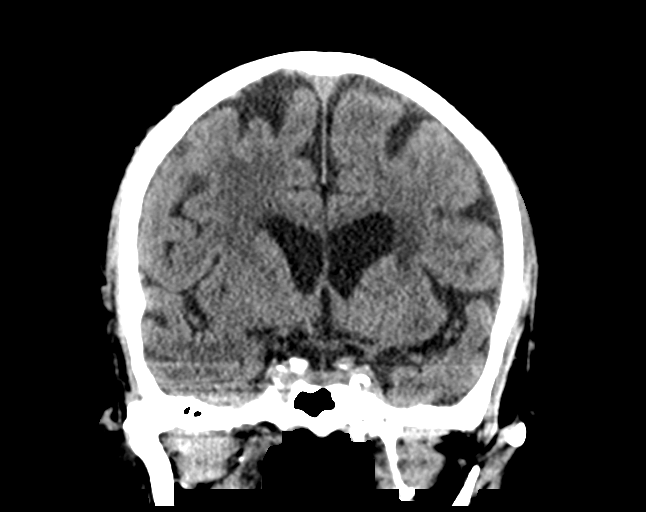

[Series 6: sagittal soft tissue · sagittal · 0.34mm/px · 3 of 69 slices shown]
[im 23/69  brain]
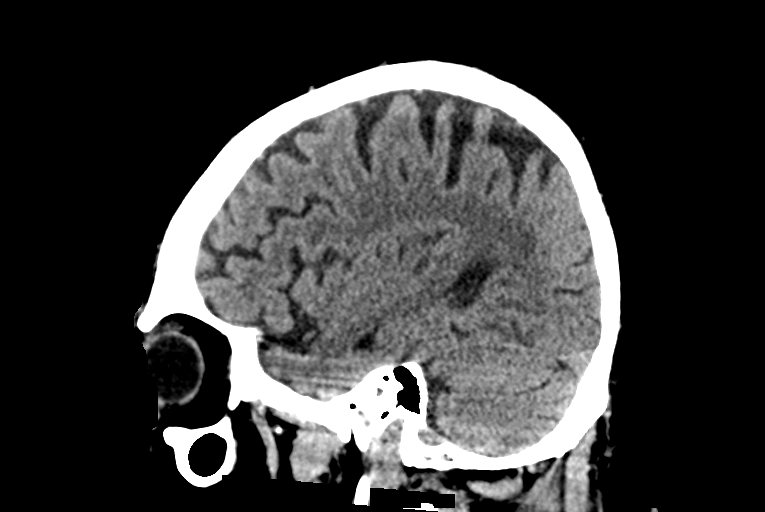
[im 35/69  brain]
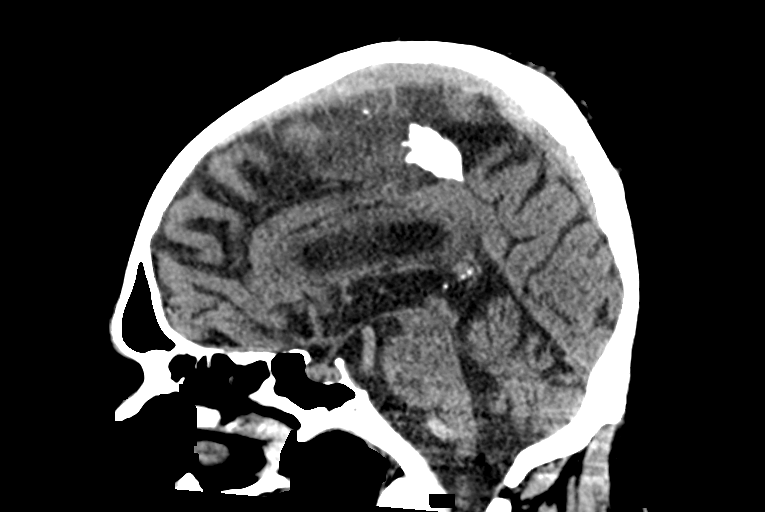
[im 46/69  brain]
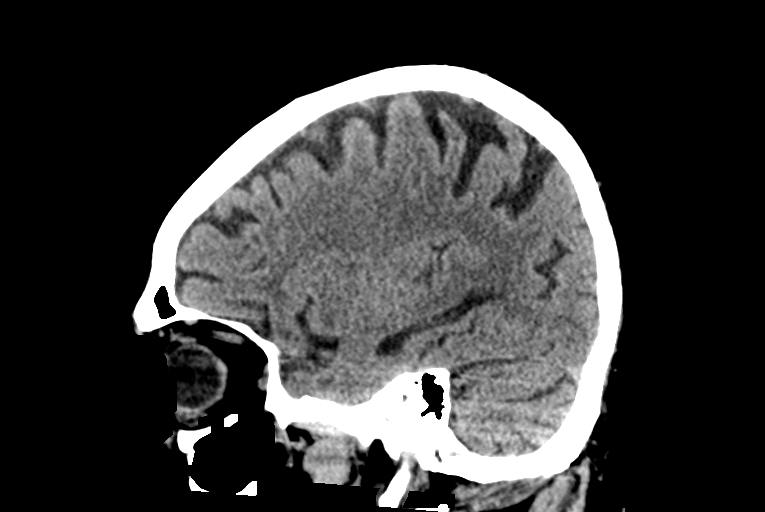

[14 of 47 positions shown; findings below may reference images not displayed]

FINDINGS: Brain: No evidence of acute infarction, hemorrhage, hydrocephalus,
extra-axial collection or mass lesion/mass effect. There is mild
diffuse low-attenuation within the subcortical and periventricular
white matter compatible with chronic microvascular disease. Chronic
left basal ganglia lacunar infarct. Prominence of the sulci and
ventricles compatible with brain atrophy.

Vascular: No hyperdense vessel or unexpected calcification.

Skull: Normal. Negative for fracture or focal lesion.

Sinuses/Orbits: No acute finding.

Other: None
IMPRESSION: 1. No acute intracranial abnormalities.
2. Chronic small vessel ischemic disease and brain atrophy.

## 2023-03-25 NOTE — Telephone Encounter (Signed)
I called an spoke to [facility] and he is aware of his patient assistance #2 boxes of Novolog Mix 70/30 Flex Pens is ready to be picked up

## 2023-03-25 NOTE — Telephone Encounter (Signed)
Patient's spouse, Derly Hodgkiss, came in to office today and picked up 2 boxes of patient assistance Novolog.

## 2023-04-04 ENCOUNTER — Other Ambulatory Visit: Payer: Self-pay | Admitting: Endocrinology

## 2023-05-02 ENCOUNTER — Other Ambulatory Visit: Payer: Self-pay | Admitting: Endocrinology

## 2023-05-13 ENCOUNTER — Encounter: Payer: Self-pay | Admitting: Family Medicine

## 2023-05-13 ENCOUNTER — Telehealth: Payer: Medicare Other | Admitting: Family Medicine

## 2023-05-13 VITALS — BP 120/82 | Ht 69.0 in | Wt 186.0 lb

## 2023-05-13 DIAGNOSIS — Z0000 Encounter for general adult medical examination without abnormal findings: Secondary | ICD-10-CM

## 2023-05-13 NOTE — Patient Instructions (Signed)
I really enjoyed getting to talk with you today! I am available on Tuesdays and Thursdays for virtual visits if you have any questions or concerns, or if I can be of any further assistance.   CHECKLIST FROM ANNUAL WELLNESS VISIT:  -Follow up (please call to schedule if not scheduled after visit):   -yearly for annual wellness visit with primary care office  Here is a list of your preventive care/health maintenance measures and the plan for each if any are due:  PLAN For any measures below that may be due:  -let us know if you would like to do any of the labs or vaccines or the colon cancer screening -schedule your eye exam  Health Maintenance  Topic Date Due   Diabetic kidney evaluation - Urine ACR  07/22/2019   Pneumonia Vaccine 56+ Years old (2 of 2 - PCV) 07/25/2021   FOOT EXAM  11/10/2023 (Originally 10/31/2022)   OPHTHALMOLOGY EXAM  11/10/2023 (Originally 07/21/2022)   Hepatitis C Screening  11/10/2023 (Originally 06/28/1973)   Zoster Vaccines- Shingrix (1 of 2) 12/11/2023 (Originally 06/28/1974)   Colonoscopy  05/12/2024 (Originally 06/28/2000)   COVID-19 Vaccine (5 - 2023-24 season) 05/26/2023   HEMOGLOBIN A1C  08/28/2023   Diabetic kidney evaluation - eGFR measurement  02/18/2024   Medicare Annual Wellness (AWV)  05/12/2024   DTaP/Tdap/Td (2 - Td or Tdap) 10/03/2029   INFLUENZA VACCINE  Completed   HPV VACCINES  Aged Out    -See a dentist at least yearly  -Get your eyes checked and then per your eye specialist's recommendations  -Other issues addressed today:   -I have included below further information regarding a healthy whole foods based diet, physical activity guidelines for adults, stress management and opportunities for social connections. I hope you find this information useful.    -----------------------------------------------------------------------------------------------------------------------------------------------------------------------------------------------------------------------------------------------------------  NUTRITION: -eat real food: lots of colorful vegetables (half the plate) and fruits -5-7 servings of vegetables and fruits per day (fresh or steamed is best), exp. 2 servings of vegetables with lunch and dinner and 2 servings of fruit per day. Berries and greens such as kale and collards are great choices.  -consume on a regular basis: whole grains (make sure first ingredient on label contains the word "whole"), fresh fruits, fish, nuts, seeds, healthy oils (such as olive oil, avocado oil, grape seed oil) -may eat small amounts of dairy and lean meat on occasion, but avoid processed meats such as ham, bacon, lunch meat, etc. -drink water -try to avoid fast food and pre-packaged foods, processed meat -most experts advise limiting sodium to < 2300mg  per day, should limit further is any chronic conditions such as high blood pressure, heart disease, diabetes, etc. The American Heart Association advised that < 1500mg  is is ideal -try to avoid foods that contain any ingredients with names you do not recognize  -try to avoid sugar/sweets (except for the natural sugar that occurs in fresh fruit) -try to avoid sweet drinks -try to avoid white rice, white bread, pasta (unless whole grain), white or yellow potatoes  EXERCISE GUIDELINES FOR ADULTS: -if you wish to increase your physical activity, do so gradually and with the approval of your doctor -STOP and seek medical care immediately if you have any chest pain, chest discomfort or trouble breathing when starting or increasing exercise  -move and stretch your body, legs, feet and arms when sitting for long periods -Physical activity guidelines for optimal health in adults: -least 150 minutes per week of  aerobic exercise (can talk, but  not sing) once approved by your doctor, 20-30 minutes of sustained activity or two 10 minute episodes of sustained activity every day.  -resistance training at least 2 days per week if approved by your doctor -balance exercises 3+ days per week:   Stand somewhere where you have something sturdy to hold onto if you lose balance.    1) lift up on toes, start with 5x per day and work up to 20x   2) stand and lift on leg straight out to the side so that foot is a few inches of the floor, start with 5x each side and work up to 20x each side   3) stand on one foot, start with 5 seconds each side and work up to 20 seconds on each side  If you need ideas or help with getting more active:  -Silver sneakers https://tools.silversneakers.com  -Walk with a Doc: http://www.duncan-williams.com/  -try to include resistance (weight lifting/strength building) and balance exercises twice per week: or the following link for ideas: http://castillo-powell.com/  BuyDucts.dk  STRESS MANAGEMENT: -can try meditating, or just sitting quietly with deep breathing while intentionally relaxing all parts of your body for 5 minutes daily -if you need further help with stress, anxiety or depression please follow up with your primary doctor or contact the wonderful folks at [facility]: [phone]  SOCIAL CONNECTIONS: -options in [place] if you wish to engage in more social and exercise related activities:  -Silver sneakers https://tools.silversneakers.com  -Walk with a Doc: http://www.duncan-williams.com/  -Check out the [facility] Active Adults 50+ section on the [place] of [employer] (hiking clubs, book clubs, cards and games, chess, exercise classes, aquatic classes and much more) - see the website for  details: https://www.[place].gov/departments/parks-recreation/active-adults50  -YouTube has lots of exercise videos for different ages and abilities as well  -[facility] (a variety of indoor and outdoor inperson activities for adults). (214)619-0133. [address]  -Virtual Online Classes (a variety of topics): see seniorplanet.org or call ([phone]  -consider volunteering at a school, hospice center, church, senior center or elsewhere

## 2023-05-13 NOTE — Progress Notes (Signed)
PATIENT CHECK-IN and HEALTH RISK ASSESSMENT QUESTIONNAIRE:  -completed by phone/video for upcoming Medicare Preventive Visit  Pre-Visit Check-in: 1)Vitals (height, wt, BP, etc) - record in vitals section for visit on day of visit Request home vitals (wt, BP, etc.) and enter into vitals, THEN update Vital Signs SmartPhrase below at the top of the HPI. See below.  2)Review and Update Medications, Allergies PMH, Surgeries, Social history in Epic 3)Hospitalizations in the last year with date/reason? No  4)Review and Update Care Team (patient's specialists) in Epic 5) Complete PHQ9 in Epic  6) Complete Fall Screening in Epic 7)Review all Health Maintenance Due and order under PCP if not done.  Medicare Wellness Patient Questionnaire:  Answer theses question about your habits: Do you drink alcohol? No If yes, how many drinks do you have a day?n/a  Have you ever smoked?No Quit date if applicable? N/a  How many packs a day do/did you smoke? N/a Do you use smokeless tobacco? No Do you use an illicit drugs?No Do you exercises? Yes IF so, what type and how many days/minutes per week?walking everyday for 30 mins. Walks on treadmill.  Are you sexually active? Yes Number of partners?1 Typical breakfast toast ,egg, oranges Typical lunch: [place] Typical dinner: chicken, carrots, green beans Typical snacks: oatmeal cakes sometimes.   Beverages: Water  Answer theses question about you: Can you perform most household chores?Yes  Do you find it hard to follow a conversation in a noisy room? No Do you often ask people to speak up or repeat themselves? No  Do you feel that you have a problem with memory?No Do you balance your checkbook and or bank acounts?yes Do you feel safe at home?yes Last dentist visit?it has been a while  Do you need assistance with any of the following: Please note if so   No   Driving?  Feeding yourself?  Getting from bed to chair?  Getting to the toilet?  Bathing or  showering?  Dressing yourself?  Managing money?  Climbing a flight of stairs  Preparing meals?    Do you have Advanced Directives in place (Living Will, Healthcare Power or Attorney)? Yes   Last eye Exam and location? Last December in [place].    Do you currently use prescribed or non-prescribed narcotic or opioid pain medications?No  Do you have a history or close family history of breast, ovarian, tubal or peritoneal cancer or a family member with BRCA (breast cancer susceptibility 1 and 2) gene mutations? No  Request home vitals (wt, BP, etc.) and enter into vitals, THEN update Vital Signs SmartPhrase below at the top of the HPI. See below.   Nurse/Assistant Credentials/time stamp: Karpuih M/CMA/5:19pm   ----------------------------------------------------------------------------------------------------------------------------------------------------------------------------------------------------------------------  Because this visit was a virtual/telehealth visit, some criteria may be missing or patient reported. Any vitals not documented were not able to be obtained and vitals that have been documented are patient reported.    MEDICARE ANNUAL PREVENTIVE CARE VISIT WITH PROVIDER (Welcome to Medicare, initial annual wellness or annual wellness exam)  Virtual Visit via Video  Note  I connected with [name] on 05/13/23  by a video enabled telemedicine application and verified that I am speaking with the correct person using two identifiers.  Location patient: home Location provider:work or home office Persons participating in the virtual visit: patient, provider  Concerns and/or follow up today: no concerns   See HM section in Epic for other details of completed HM.    ROS: negative for report of fevers, unintentional  weight loss, vision changes, vision loss, hearing loss or change, chest pain, sob, hemoptysis, melena, hematochezia, hematuria, falls,  bleeding or bruising, thoughts of suicide or self harm, memory loss  Patient-completed extensive health risk assessment - reviewed and discussed with the patient: See Health Risk Assessment completed with patient prior to the visit either above or in recent phone note. This was reviewed in detailed with the patient today and appropriate recommendations, orders and referrals were placed as needed per Summary below and patient instructions.   Review of Medical History: -PMH, PSH, Family History and current specialty and care providers reviewed and updated and listed below   Patient Care Team: [name], [name], MD as PCP - General (Internal Medicine) Swaziland, Peter M, MD as PCP - Cardiology (Cardiology) [name], [name], MD (Internal Medicine) Sherrill Raring, [facility] (Pharmacist) [place], [place], MD as Consulting Physician (Endocrinology)   Past Medical History:  Diagnosis Date   Anemia    Benign localized prostatic hyperplasia with lower urinary tract symptoms (LUTS)    CAD (coronary artery disease)    cardiologist--- dr Swaziland;   11-27-2020 cardiac cath for abnormal cardiolite,  severe 3V disease;   11-30-2000 s/p CABG x4;  cath 04-21-2005 patent SVG-OM1 & SVG --PDA graft's,  occluded radial graft -- OM, mod to sev LV dysfunction,  not candidate for revascularization   Chronic diastolic CHF (congestive heart failure) (HCC)    followed by cardiologist   Chronic kidney disease (CKD), stage IV (severe) (HCC)    nephrologist--- dr Marisue Humble   (lov note scanned in [place] , epic, dated 09-26-2020)   Dilated cardiomyopathy (HCC)    echo in 2009 showed improvement with a normal EF   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 03-08-2021   (09-03-2021  per pt wife is resolving)   History of COVID-19    spring 2022 with mild symptoms that resolved   History of pulmonary embolism 03/13/2020   complete blood thinner   History of sepsis    hx admission for severe  sepsis twice;  admitted 03-13-2020 secondary to aspiration pneumonia w/ ARF and PE;   admitted 10-07-2020 secondary to MSSA uti   History of stroke    per imaging 05-07-2021 head CT ,  remote infarcts left thalamus, basal ganglia   HTN (hypertension)    Hyperlipemia    Ischemic heart disease    cardiologist--- dr Swaziland;   Malignant neoplasm prostate [facility]) 05/2021   urologist-- dr eskridge/  oncologist--- dr Clelia Croft;  dx 11/ 2022,  Gleason 9, PSA 14   Multiple myeloma in remission [facility]) 2021   oncologist--- dr Candise Che--- dx 2021, IgG Kappa type, completed chemo 2021, in remission since   OA (osteoarthritis)    PAD (peripheral artery disease) (HCC)    RA (rheumatoid arthritis) [facility])    rheumatologist--- dr s. Corliss Skains---  multiple sites,  no other treatment other than takes tylenol arthritis with flare-ups   Tympanic membrane central perforation, left    followed by dr bates (ent)  chronic since 08/ 2022   (09-03-2021  per pt wife has partially closed)   Type 2 diabetes mellitus [facility])    endocrinologist--- dr a Lucianne Muss   Uses walker    Wears glasses     Past Surgical History:  Procedure Laterality Date   CARDIAC CATHETERIZATION  11/27/2000   @[facility]  by Dr Swaziland;   severe 3V disease,  ef 15-25%   CARDIAC CATHETERIZATION  04/21/2005   by  Dr Swaziland;    patnet SVG -- OM1 & SVG --PDA grafts,  occluded radial graft --OM1,  atretic LIMA -- Diag,  moderate to severe LV dysfunction,  not candidate for revascularization   CATARACT EXTRACTION W/ INTRAOCULAR LENS IMPLANT Right 08/21/2021   CORONARY ARTERY BYPASS GRAFT  11/30/2000   @[facility]  by Dr Morton Peters;   LIMA -- Diag,  radial artery -- OM1,  SVG -- OM,  SVG -- RCA   GOLD SEED IMPLANT N/A 09/05/2021   Procedure: GOLD SEED IMPLANT;  Surgeon: Jerilee Field, MD;  Location: [facility];  Service: Urology;  Laterality: N/A;  45 MINS FOR CASE   IR FLUORO GUIDED NEEDLE PLC ASPIRATION/INJECTION LOC  10/24/2019   SPACE OAR INSTILLATION N/A  09/05/2021   Procedure: SPACE OAR INSTILLATION;  Surgeon: Jerilee Field, MD;  Location: [facility];  Service: Urology;  Laterality: N/A;   TRANSURETHRAL RESECTION OF PROSTATE N/A 01/06/2021   Procedure: CYSTOSCOPY WITH CYSTOGRAM/TRANSURETHRAL RESECTION OF THE PROSTATE (TURP);  Surgeon: Crista Elliot, MD;  Location: [facility];  Service: Urology;  Laterality: N/A;    Social History   Socioeconomic History   Marital status: Married    Spouse name: Not on file   Number of children: 1   Years of education: Not on file   Highest education level: 12th grade  Occupational History   Occupation: Chartered certified accountant  Tobacco Use   Smoking status: Never   Smokeless tobacco: Never  Vaping Use   Vaping status: Never Used  Substance and Sexual Activity   Alcohol use: Not Currently   Drug use: Never   Sexual activity: Yes  Other Topics Concern   Not on file  Social History Narrative   ** Merged History Encounter **       Social Determinants of Health   Financial Resource Strain: Low Risk  (05/07/2021)   Overall Financial Resource Strain (CARDIA)    Difficulty of Paying Living Expenses: Not hard at all  Food Insecurity: No Food Insecurity (12/08/2022)   Hunger Vital Sign    Worried About Running Out of Food in the Last Year: Never true    Ran Out of Food in the Last Year: Never true  Transportation Needs: No Transportation Needs (06/01/2022)   PRAPARE - Administrator, Civil Service (Medical): No    Lack of Transportation (Non-Medical): No  Physical Activity: Unknown (05/07/2021)   Exercise Vital Sign    Days of Exercise per Week: 0 days    Minutes of Exercise per Session: Not on file  Stress: No Stress Concern Present (05/07/2021)   [employer] of Occupational Health - Occupational Stress Questionnaire    Feeling of Stress : Not at all  Social Connections: Unknown (06/01/2022)   Social Connection and Isolation Panel [NHANES]    Frequency of  Communication with Friends and Family: Three times a week    Frequency of Social Gatherings with Friends and Family: Once a week    Attends Religious Services: More than 4 times per year    Active Member of [employer] or Organizations: Not on file    Attends Banker Meetings: Not on file    Marital Status: Married  Catering manager Violence: Not on file    Family History  Problem Relation Age of Onset   Hypertension Father    Diabetes Mother        DIABETIC COMA   Healthy Daughter     Current Outpatient Medications on File  Prior to Visit  Medication Sig Dispense Refill   Accu-Chek Softclix Lancets lancets USE ACCU CHEK SOFTCLIX LANCETS TO CHECK BLOOD SUGAR FASTING OR 2 HOURS AFTER A MEAL EVERY OTHER DAY. 100 each 3   allopurinol (ZYLOPRIM) 100 MG tablet Take 100 mg by mouth daily.     amLODipine (NORVASC) 10 MG tablet Take 1 tablet (10 mg total) by mouth daily. 90 tablet 3   aspirin EC 81 MG tablet Take 81 mg by mouth daily. Swallow whole.     atorvastatin (LIPITOR) 80 MG tablet Take 1 tablet (80 mg total) by mouth daily. 90 tablet 3   Blood Glucose Monitoring Suppl (ACCU-CHEK GUIDE ME) w/Device KIT 1 each by Does not apply route.     carvedilol (COREG) 25 MG tablet TAKE 1 TABLET BY MOUTH TWICE DAILY WITH A MEAL 180 tablet 2   cyanocobalamin (VITAMIN B12) 500 MCG tablet Take 500 mcg by mouth daily.     dexamethasone (DECADRON) 0.1 % ophthalmic suspension      empagliflozin (JARDIANCE) 25 MG TABS tablet Take half tablet daily. 45 tablet 3   glucose blood (ACCU-CHEK GUIDE) test strip USE 1  TO CHECK GLUCOSE ONCE DAILY 100 each 0   hydrALAZINE (APRESOLINE) 50 MG tablet Take 1 tablet (50 mg total) by mouth 3 (three) times daily. 270 tablet 3   hydroxychloroquine (PLAQUENIL) 200 MG tablet Take 400 mg by mouth daily.     hydroxypropyl methylcellulose / hypromellose (ISOPTO TEARS / GONIOVISC) 2.5 % ophthalmic solution Place 2 drops into the left eye as needed for dry eyes. 15 mL 2    insulin aspart protamine - aspart (NOVOLOG MIX 70/30 FLEXPEN) (70-30) 100 UNIT/ML FlexPen Inject 8 Units into the skin daily with breakfast. Upto 20 units before breakfast as directed 6 mL 4   Insulin Pen Needle 31G X 5 MM MISC Use for pen 100 each 1   isosorbide mononitrate (IMDUR) 30 MG 24 hr tablet Take 1 tablet (30 mg total) by mouth daily. 90 tablet 3   KLOR-CON M20 20 MEQ tablet Take 1 tablet by mouth once daily 30 tablet 0   Current Facility-Administered Medications on File Prior to Visit  Medication Dose Route Frequency Provider Last Rate Last Admin   sodium chloride flush (NS) 0.9 % injection 10 mL  10 mL Intravenous PRN Johney Maine, MD       sodium chloride flush (NS) 0.9 % injection 10 mL  10 mL Intracatheter Once PRN Johney Maine, MD        Allergies  Allergen Reactions   Metformin And Related Diarrhea       Physical Exam Vitals requested from patient and listed below if patient had equipment and was able to obtain at home for this virtual visit: Vitals:   05/13/23 1705  BP: 120/82   Estimated body mass index is 27.47 kg/m as calculated from the following:   Height as of this encounter: 5\' 9"  (1.753 m).   Weight as of this encounter: 186 lb (84.4 kg).  EKG (optional): deferred due to virtual visit  GENERAL: alert, oriented, no acute distress detected; full vision exam deferred due to pandemic and/or virtual encounter  HEENT: atraumatic, conjunttiva clear, no obvious abnormalities on inspection of external nose and ears  NECK: normal movements of the head and neck  LUNGS: on inspection no signs of respiratory distress, breathing rate appears normal, no obvious gross SOB, gasping or wheezing  CV: no obvious cyanosis  MS: moves all visible extremities  without noticeable abnormality  PSYCH/NEURO: pleasant and cooperative, no obvious depression or anxiety, speech and thought processing grossly intact, Cognitive function grossly intact  Flowsheet  Row Video Visit from 05/13/2023 in [facility] [place]  PHQ-9 Total Score 0           05/13/2023    5:02 PM 12/02/2022   10:55 AM 03/26/2022   10:40 AM 11/13/2020    1:44 PM 10/04/2019   11:20 AM  Depression screen PHQ 2/9  Decreased Interest 0 0 0 0 0  Down, Depressed, Hopeless 0 0 0 0 0  PHQ - 2 Score 0 0 0 0 0  Altered sleeping 0 0 0 0 0  Tired, decreased energy 0 0 0 0 0  Change in appetite 0 0 0 0 0  Feeling bad or failure about yourself  0 0 0 0 0  Trouble concentrating 0 0 0 0 0  Moving slowly or fidgety/restless 0 0 0 0 0  Suicidal thoughts 0 0 0 0 0  PHQ-9 Score 0 0 0 0 0  Difficult doing work/chores   Not difficult at all Not difficult at all Not difficult at all       05/28/2022   10:50 AM 06/01/2022    6:54 PM 09/03/2022   10:18 AM 12/02/2022   10:55 AM 05/13/2023    5:02 PM  Fall Risk  Falls in the past year? 0 0 0 0 0  Was there an injury with Fall?   0 0 0  Fall Risk Category Calculator   0 0 0  Patient at Risk for Falls Due to     No Fall Risks  Fall risk Follow up   Falls evaluation completed Falls evaluation completed Falls evaluation completed     SUMMARY AND PLAN:  Encounter for Medicare annual wellness exam  Discussed applicable health maintenance/preventive health measures and advised and referred or ordered per patient preferences: -advised of each measure and discussed options - he declined most. He is considering the pneumonia vaccine at the pharmacy and reports does labs with PCP and endo.  Health Maintenance  Topic Date Due   Diabetic kidney evaluation - Urine ACR  07/22/2019   Pneumonia Vaccine 73+ Years old (2 of 2 - PCV) 07/25/2021   FOOT EXAM  11/10/2023 (Originally 10/31/2022)   OPHTHALMOLOGY EXAM  11/10/2023 (Originally 07/21/2022)   Hepatitis C Screening  11/10/2023 (Originally 06/28/1973)   Zoster Vaccines- Shingrix (1 of 2) 12/11/2023 (Originally 06/28/1974)   Colonoscopy  05/12/2024 (Originally 06/28/2000)    COVID-19 Vaccine (5 - 2023-24 season) 05/26/2023   HEMOGLOBIN A1C  08/28/2023   Diabetic kidney evaluation - eGFR measurement  02/18/2024   Medicare Annual Wellness (AWV)  05/12/2024   DTaP/Tdap/Td (2 - Td or Tdap) 10/03/2029   INFLUENZA VACCINE  Completed   HPV VACCINES  Aged [employer] and counseling on the following was provided based on the above review of health and a plan/checklist for the patient, along with additional information discussed, was provided for the patient in the patient instructions :  -Advised and counseled on a healthy lifestyle - including the importance of a healthy diet, regular physical activity, social connections and stress management. -Reviewed patient's current diet. Advised and counseled on a whole foods based healthy diet. A summary of a healthy diet was provided in the Patient Instructions.  -reviewed patient's current physical activity level and discussed exercise guidelines for adults. Discussed community resources and ideas for safe  exercise at home to assist in meeting exercise guideline recommendations in a safe and healthy way.  -advised to call if changes mind regarding any of the health maintenance measures -provided pharm number as he had follow up questions related to recent messages from pharmacy team.medication assistance - he agrees to call tomorrow -has follow up scheduled with PCP -Advise yearly dental visits at minimum and regular eye exams   Follow up: see patient instructions   Patient Instructions  I really enjoyed getting to talk with you today! I am available on Tuesdays and Thursdays for virtual visits if you have any questions or concerns, or if I can be of any further assistance.   CHECKLIST FROM ANNUAL WELLNESS VISIT:  -Follow up (please call to schedule if not scheduled after visit):   -yearly for annual wellness visit with primary care office  Here is a list of your preventive care/health maintenance measures and  the plan for each if any are due:  PLAN For any measures below that may be due:  -let us know if you would like to do any of the labs or vaccines or the colon cancer screening -schedule your eye exam  Health Maintenance  Topic Date Due   Diabetic kidney evaluation - Urine ACR  07/22/2019   Pneumonia Vaccine 43+ Years old (2 of 2 - PCV) 07/25/2021   FOOT EXAM  11/10/2023 (Originally 10/31/2022)   OPHTHALMOLOGY EXAM  11/10/2023 (Originally 07/21/2022)   Hepatitis C Screening  11/10/2023 (Originally 06/28/1973)   Zoster Vaccines- Shingrix (1 of 2) 12/11/2023 (Originally 06/28/1974)   Colonoscopy  05/12/2024 (Originally 06/28/2000)   COVID-19 Vaccine (5 - 2023-24 season) 05/26/2023   HEMOGLOBIN A1C  08/28/2023   Diabetic kidney evaluation - eGFR measurement  02/18/2024   Medicare Annual Wellness (AWV)  05/12/2024   DTaP/Tdap/Td (2 - Td or Tdap) 10/03/2029   INFLUENZA VACCINE  Completed   HPV VACCINES  Aged Out    -See a dentist at least yearly  -Get your eyes checked and then per your eye specialist's recommendations  -Other issues addressed today:   -I have included below further information regarding a healthy whole foods based diet, physical activity guidelines for adults, stress management and opportunities for social connections. I hope you find this information useful.   -----------------------------------------------------------------------------------------------------------------------------------------------------------------------------------------------------------------------------------------------------------  NUTRITION: -eat real food: lots of colorful vegetables (half the plate) and fruits -5-7 servings of vegetables and fruits per day (fresh or steamed is best), exp. 2 servings of vegetables with lunch and dinner and 2 servings of fruit per day. Berries and greens such as kale and collards are great choices.  -consume on a regular basis: whole grains (make sure first  ingredient on label contains the word "whole"), fresh fruits, fish, nuts, seeds, healthy oils (such as olive oil, avocado oil, grape seed oil) -may eat small amounts of dairy and lean meat on occasion, but avoid processed meats such as ham, bacon, lunch meat, etc. -drink water -try to avoid fast food and pre-packaged foods, processed meat -most experts advise limiting sodium to < 2300mg  per day, should limit further is any chronic conditions such as high blood pressure, heart disease, diabetes, etc. The American Heart Association advised that < 1500mg  is is ideal -try to avoid foods that contain any ingredients with names you do not recognize  -try to avoid sugar/sweets (except for the natural sugar that occurs in fresh fruit) -try to avoid sweet drinks -try to avoid white rice, white bread, pasta (unless whole grain), white or  yellow potatoes  EXERCISE GUIDELINES FOR ADULTS: -if you wish to increase your physical activity, do so gradually and with the approval of your doctor -STOP and seek medical care immediately if you have any chest pain, chest discomfort or trouble breathing when starting or increasing exercise  -move and stretch your body, legs, feet and arms when sitting for long periods -Physical activity guidelines for optimal health in adults: -least 150 minutes per week of aerobic exercise (can talk, but not sing) once approved by your doctor, 20-30 minutes of sustained activity or two 10 minute episodes of sustained activity every day.  -resistance training at least 2 days per week if approved by your doctor -balance exercises 3+ days per week:   Stand somewhere where you have something sturdy to hold onto if you lose balance.    1) lift up on toes, start with 5x per day and work up to 20x   2) stand and lift on leg straight out to the side so that foot is a few inches of the floor, start with 5x each side and work up to 20x each side   3) stand on one foot, start with 5 seconds each  side and work up to 20 seconds on each side  If you need ideas or help with getting more active:  -Silver sneakers https://tools.silversneakers.com  -Walk with a Doc: http://www.duncan-williams.com/  -try to include resistance (weight lifting/strength building) and balance exercises twice per week: or the following link for ideas: http://castillo-powell.com/  BuyDucts.dk  STRESS MANAGEMENT: -can try meditating, or just sitting quietly with deep breathing while intentionally relaxing all parts of your body for 5 minutes daily -if you need further help with stress, anxiety or depression please follow up with your primary doctor or contact the wonderful folks at [facility]: [phone]  SOCIAL CONNECTIONS: -options in [place] if you wish to engage in more social and exercise related activities:  -Silver sneakers https://tools.silversneakers.com  -Walk with a Doc: http://www.duncan-williams.com/  -Check out the [facility] Active Adults 50+ section on the [place] [employer] (hiking clubs, book clubs, cards and games, chess, exercise classes, aquatic classes and much more) - see the website for details: https://www.[place].gov/departments/parks-recreation/active-adults50  -YouTube has lots of exercise videos for different ages and abilities as well  -[facility] (a variety of indoor and outdoor inperson activities for adults). 3133154042. [address]  -Virtual Online Classes (a variety of topics): see seniorplanet.org or call [phone]  -consider volunteering at a school, hospice center, church, senior center or elsewhere           Terressa Koyanagi, DO

## 2023-05-27 ENCOUNTER — Other Ambulatory Visit: Payer: Self-pay

## 2023-05-27 DIAGNOSIS — Z794 Long term (current) use of insulin: Secondary | ICD-10-CM

## 2023-05-27 MED ORDER — EMPAGLIFLOZIN 25 MG PO TABS
ORAL_TABLET | ORAL | 3 refills | Status: DC
Start: 2023-05-27 — End: 2023-06-03

## 2023-06-01 ENCOUNTER — Telehealth: Payer: Self-pay

## 2023-06-01 NOTE — Progress Notes (Signed)
   06/01/2023  Patient ID: [name], male   DOB: 04/22/1956, 67 y.o.   MRN: [id number]  Patient called in stating he was having issues obtaining his [place], pharmacy was trying to charge $132. Patient is approved to get Jardiance for free via the [employer].  Contacted [facility] and ensured they had the correct billing info on file. Prescription ready for pickup at $0 copay, patient aware and will pick up today.  Billing Info for Future Reference (active through 10/24/23) BIN: [id number]   PCN: [id number]   Group: [id number]   ID: [id number]  Instructed patient to notify us again if any further questions or concerns.  Sherrill Raring, PharmD Clinical Pharmacist [phone]

## 2023-06-01 NOTE — Telephone Encounter (Signed)
-----   Message from Lenna Gilford sent at 05/31/2023  3:23 PM EST ----- Regarding: Ginnie Smart,  I had a voicemail from this patient from over the weekend about the cost of his Jardiance.  I believe I worked with him in the past and thought it was on [place] (through PAP, I think), but it looks like endo may have changed him to [place].  Could you reach out to him when you have a chance?  Thank you!  Lenna Gilford, PharmD, DPLA

## 2023-06-03 ENCOUNTER — Encounter: Payer: Self-pay | Admitting: Endocrinology

## 2023-06-03 ENCOUNTER — Ambulatory Visit (INDEPENDENT_AMBULATORY_CARE_PROVIDER_SITE_OTHER): Payer: Medicare Other | Admitting: Endocrinology

## 2023-06-03 ENCOUNTER — Other Ambulatory Visit: Payer: Self-pay | Admitting: Cardiology

## 2023-06-03 VITALS — BP 138/60 | HR 75 | Resp 20 | Ht 69.0 in | Wt 192.8 lb

## 2023-06-03 DIAGNOSIS — E1169 Type 2 diabetes mellitus with other specified complication: Secondary | ICD-10-CM

## 2023-06-03 DIAGNOSIS — Z794 Long term (current) use of insulin: Secondary | ICD-10-CM

## 2023-06-03 LAB — POCT GLYCOSYLATED HEMOGLOBIN (HGB A1C): Hemoglobin A1C: 6.8 % — AB (ref 4.0–5.6)

## 2023-06-03 LAB — MICROALBUMIN / CREATININE URINE RATIO
Creatinine,U: 88.4 mg/dL
Microalb Creat Ratio: 5.2 mg/g (ref 0.0–30.0)
Microalb, Ur: 4.6 mg/dL — ABNORMAL HIGH (ref 0.0–1.9)

## 2023-06-03 MED ORDER — EMPAGLIFLOZIN 25 MG PO TABS
ORAL_TABLET | ORAL | 3 refills | Status: DC
  Filled 2024-02-22: qty 45, 90d supply, fill #0
  Filled 2024-02-22 – 2024-03-06 (×2): qty 15, 30d supply, fill #0
  Filled 2024-04-11: qty 15, 30d supply, fill #1
  Filled 2024-05-25: qty 15, 30d supply, fill #2

## 2023-06-03 NOTE — Patient Instructions (Signed)
Bring meter in clinic visit.  No change in medicine.

## 2023-06-03 NOTE — Progress Notes (Signed)
Outpatient Endocrinology Note Joseph Raeanna Soberanes, MD  06/03/23  [name]'s Name: [name]    DOB: 1955-12-26    MRN: [id number]                                                    REASON OF VISIT: Follow up for type 2 diabetes mellitus  PCP: [name], [name], MD  HISTORY OF PRESENT ILLNESS:   [name] is a 67 y.o. old male with past medical history listed below, is here for follow up of type 2 diabetes mellitus.   Pertinent Diabetes History: [name] was diagnosed with type 2 diabetes mellitus in 2018.  Hemoglobin A1c was 8.8% at the time of diagnosis.  Chronic Diabetes Complications : Retinopathy: no. Last ophthalmology exam was done on annually, reportedly. Nephropathy: CKD IIIa Peripheral neuropathy: no Coronary artery disease: yes Stroke: yes  Relevant comorbidities and cardiovascular risk factors: Obesity: no Body mass index is 28.47 kg/m.  Hypertension: yes Hyperlipidemia. Yes, on statin  Current / Home Diabetic regimen includes: NovoLog Mix 70/30 : 8 units in the morning daily. Jardiance 12.5 mg daily.  Prior diabetic medications: Jardiance was decreased from 25 mg due to elevated creatinine. Prandin, stopped due to nausea.  Glycemic data:   [name] forgot to bring the glucometer in the clinic today.  [name] recalls having blood sugar about 130-140s range.  [name] has been checking mostly in the morning fasting, sometimes 1-2 times a day.  [name] has Accu-Chek guide meter.  Hypoglycemia: [name] has denied hypoglycemic episodes. [name] has hypoglycemia awareness.  Interval history  No glucometer to review.  [name] forgot to bring.  Denies hypoglycemic symptoms.  Diabetes regimen as noted above.  Hemoglobin A1c today 6.8%.  [name] has no complaints today.  REVIEW OF SYSTEMS As per history of present illness.   PAST MEDICAL HISTORY: Past Medical History:  Diagnosis Date   Anemia    Benign localized prostatic hyperplasia with lower urinary tract symptoms (LUTS)     CAD (coronary artery disease)    cardiologist--- dr Swaziland;   11-27-2020 cardiac cath for abnormal cardiolite,  severe 3V disease;   11-30-2000 s/p CABG x4;  cath 04-21-2005 patent SVG-OM1 & SVG --PDA graft's,  occluded radial graft -- OM, mod to sev LV dysfunction,  not candidate for revascularization   Chronic diastolic CHF (congestive heart failure) (HCC)    followed by cardiologist   Chronic kidney disease (CKD), stage IV (severe) [facility])    nephrologist--- dr Marisue Humble   (lov note scanned in [place] , epic, dated 09-26-2020)   Dilated cardiomyopathy (HCC)    echo in 2009 showed improvement with a normal EF   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 03-08-2021   (09-03-2021  per pt wife is resolving)   History of COVID-19    spring 2022 with mild symptoms that resolved   History of pulmonary embolism 03/13/2020   complete blood thinner   History of sepsis    hx admission for severe sepsis twice;  admitted 03-13-2020 secondary to aspiration pneumonia w/ ARF and PE;   admitted 10-07-2020 secondary to MSSA uti   History of stroke    per imaging 05-07-2021 head CT ,  remote infarcts left thalamus, basal ganglia   HTN (hypertension)    Hyperlipemia    Ischemic  heart disease    cardiologist--- dr Swaziland;   Malignant neoplasm prostate [facility]) 05/2021   urologist-- dr eskridge/  oncologist--- dr Clelia Croft;  dx 11/ 2022,  Gleason 9, PSA 14   Multiple myeloma in remission [facility]) 2021   oncologist--- dr Candise Che--- dx 2021, IgG Kappa type, completed chemo 2021, in remission since   OA (osteoarthritis)    PAD (peripheral artery disease) (HCC)    RA (rheumatoid arthritis) [facility])    rheumatologist--- dr s. Corliss Skains---  multiple sites,  no other treatment other than takes tylenol arthritis with flare-ups   Tympanic membrane central perforation, left    followed by dr bates (ent)  chronic since 08/ 2022   (09-03-2021  per pt wife has partially closed)   Type 2 diabetes mellitus [facility])     endocrinologist--- dr a Lucianne Muss   Uses walker    Wears glasses     PAST SURGICAL HISTORY: Past Surgical History:  Procedure Laterality Date   CARDIAC CATHETERIZATION  11/27/2000   @[facility]  by Dr Swaziland;   severe 3V disease,  ef 15-25%   CARDIAC CATHETERIZATION  04/21/2005   by Dr Swaziland;    patnet SVG -- OM1 & SVG --PDA grafts,  occluded radial graft --OM1,  atretic LIMA -- Diag,  moderate to severe LV dysfunction,  not candidate for revascularization   CATARACT EXTRACTION W/ INTRAOCULAR LENS IMPLANT Right 08/21/2021   CORONARY ARTERY BYPASS GRAFT  11/30/2000   @[facility]  by Dr Morton Peters;   LIMA -- Diag,  radial artery -- OM1,  SVG -- OM,  SVG -- RCA   GOLD SEED IMPLANT N/A 09/05/2021   Procedure: GOLD SEED IMPLANT;  Surgeon: Jerilee Field, MD;  [facility];  Service: Urology;  Laterality: N/A;  45 MINS FOR CASE   IR FLUORO GUIDED NEEDLE PLC ASPIRATION/INJECTION LOC  10/24/2019   SPACE OAR INSTILLATION N/A 09/05/2021   Procedure: SPACE OAR INSTILLATION;  Surgeon: Jerilee Field, MD;  Location: [facility];  Service: Urology;  Laterality: N/A;   TRANSURETHRAL RESECTION OF PROSTATE N/A 01/06/2021   Procedure: CYSTOSCOPY WITH CYSTOGRAM/TRANSURETHRAL RESECTION OF THE PROSTATE (TURP);  Surgeon: Crista Elliot, MD;  Location: [facility];  Service: Urology;  Laterality: N/A;    ALLERGIES: Allergies  Allergen Reactions   Metformin And Related Diarrhea    FAMILY HISTORY:  Family History  Problem Relation Age of Onset   Hypertension Father    Diabetes Mother        DIABETIC COMA   Healthy Daughter     SOCIAL HISTORY: Social History   Socioeconomic History   Marital status: Married    Spouse name: Not on file   Number of children: 1   Years of education: Not on file   Highest education level: 12th grade  Occupational History   Occupation: Chartered certified accountant  Tobacco Use   Smoking status: Never   Smokeless tobacco: Never  Vaping Use   Vaping status:  Never Used  Substance and Sexual Activity   Alcohol use: Not Currently   Drug use: Never   Sexual activity: Yes  Other Topics Concern   Not on file  Social History Narrative   ** Merged History Encounter **       Social Determinants of Health   Financial Resource Strain: Low Risk  (05/07/2021)   Overall Financial Resource Strain (CARDIA)    Difficulty of Paying Living Expenses: Not hard at all  Food Insecurity: No Food Insecurity (12/08/2022)   Hunger Vital Sign  Worried About Programme researcher, broadcasting/film/video in the Last Year: Never true    Ran Out of Food in the Last Year: Never true  Transportation Needs: No Transportation Needs (06/01/2022)   PRAPARE - Administrator, Civil Service (Medical): No    Lack of Transportation (Non-Medical): No  Physical Activity: Unknown (05/07/2021)   Exercise Vital Sign    Days of Exercise per Week: 0 days    Minutes of Exercise per Session: Not on file  Stress: No Stress Concern Present (05/07/2021)   [employer] of Occupational Health - Occupational Stress Questionnaire    Feeling of Stress : Not at all  Social Connections: Unknown (06/01/2022)   Social Connection and Isolation Panel [NHANES]    Frequency of Communication with Friends and Family: Three times a week    Frequency of Social Gatherings with Friends and Family: Once a week    Attends Religious Services: More than 4 times per year    Active Member of [employer] or Organizations: Not on file    Attends Banker Meetings: Not on file    Marital Status: Married    MEDICATIONS:  Current Outpatient Medications  Medication Sig Dispense Refill   Accu-Chek Softclix Lancets lancets USE ACCU CHEK SOFTCLIX LANCETS TO CHECK BLOOD SUGAR FASTING OR 2 HOURS AFTER A MEAL EVERY OTHER DAY. 100 each 3   allopurinol (ZYLOPRIM) 100 MG tablet Take 100 mg by mouth daily.     amLODipine (NORVASC) 10 MG tablet Take 1 tablet (10 mg total) by mouth daily. 90 tablet 3   aspirin EC 81 MG  tablet Take 81 mg by mouth daily. Swallow whole.     atorvastatin (LIPITOR) 80 MG tablet Take 1 tablet (80 mg total) by mouth daily. 90 tablet 3   Blood Glucose Monitoring Suppl (ACCU-CHEK GUIDE ME) w/Device KIT 1 each by Does not apply route.     carvedilol (COREG) 25 MG tablet TAKE 1 TABLET BY MOUTH TWICE DAILY WITH A MEAL 180 tablet 2   cyanocobalamin (VITAMIN B12) 500 MCG tablet Take 500 mcg by mouth daily.     dexamethasone (DECADRON) 0.1 % ophthalmic suspension      glucose blood (ACCU-CHEK GUIDE) test strip USE 1  TO CHECK GLUCOSE ONCE DAILY 100 each 0   hydrALAZINE (APRESOLINE) 50 MG tablet Take 1 tablet (50 mg total) by mouth 3 (three) times daily. 270 tablet 3   hydroxychloroquine (PLAQUENIL) 200 MG tablet Take 400 mg by mouth daily.     hydroxypropyl methylcellulose / hypromellose (ISOPTO TEARS / GONIOVISC) 2.5 % ophthalmic solution Place 2 drops into the left eye as needed for dry eyes. 15 mL 2   insulin aspart protamine - aspart (NOVOLOG MIX 70/30 FLEXPEN) (70-30) 100 UNIT/ML FlexPen Inject 8 Units into the skin daily with breakfast. Upto 20 units before breakfast as directed 6 mL 4   Insulin Pen Needle 31G X 5 MM MISC Use for pen 100 each 1   isosorbide mononitrate (IMDUR) 30 MG 24 hr tablet Take 1 tablet (30 mg total) by mouth daily. 90 tablet 3   KLOR-CON M20 20 MEQ tablet Take 1 tablet by mouth once daily 30 tablet 0   empagliflozin (JARDIANCE) 25 MG TABS tablet Take half tablet daily. 45 tablet 3   No current facility-administered medications for this visit.   Facility-Administered Medications Ordered in Other Visits  Medication Dose Route Frequency Provider Last Rate Last Admin   sodium chloride flush (NS) 0.9 % injection 10 mL  10 mL Intravenous PRN Johney Maine, MD       sodium chloride flush (NS) 0.9 % injection 10 mL  10 mL Intracatheter Once PRN Johney Maine, MD        PHYSICAL EXAM: Vitals:   06/03/23 0939  BP: 138/60  Pulse: 75  Resp: 20  SpO2:  97%  Weight: 192 lb 12.8 oz (87.5 kg)  Height: 5\' 9"  (1.753 m)    Body mass index is 28.47 kg/m.  Wt Readings from Last 3 Encounters:  06/03/23 192 lb 12.8 oz (87.5 kg)  05/13/23 186 lb (84.4 kg)  02/25/23 186 lb 6.4 oz (84.6 kg)    General: Well developed, well nourished male in no apparent distress.  HEENT: AT/[place], no external lesions.  Eyes: Conjunctiva clear and no icterus. Neck: Neck supple  Lungs: Respirations not labored Neurologic: Alert, oriented, normal speech Extremities / Skin: Dry. No sores or rashes noted.  Psychiatric: Does not appear depressed or anxious  Diabetic Foot Exam - Simple   No data filed    [name] does not want to have diabetic foot exam today.  [name] wants to postpone for next time.  LABS Reviewed Lab Results  Component Value Date   HGBA1C 6.8 (A) 06/03/2023   HGBA1C 6.7 (A) 02/25/2023   HGBA1C 7.1 (A) 12/02/2022   Lab Results  Component Value Date   FRUCTOSAMINE 297 (H) 07/22/2021   FRUCTOSAMINE 347 (H) 04/14/2021   FRUCTOSAMINE 237 11/08/2020   Lab Results  Component Value Date   CHOL 116 06/16/2022   HDL 42 06/16/2022   LDLCALC 51 06/16/2022   LDLDIRECT 50.0 06/08/2022   TRIG 128 06/16/2022   CHOLHDL 2.8 06/16/2022   Lab Results  Component Value Date   MICRALBCREAT 10.0 07/21/2018   Lab Results  Component Value Date   CREATININE 1.46 02/18/2023   Lab Results  Component Value Date   GFR 49.41 (L) 02/18/2023    ASSESSMENT / PLAN  1. Type 2 diabetes mellitus with other specified complication, with long-term current use of insulin (HCC)      Diabetes Mellitus type 2, complicated by CAD/ CKD - Diabetic status / severity: Controlled.  Lab Results  Component Value Date   HGBA1C 6.8 (A) 06/03/2023    - Hemoglobin A1c goal : <7%  - Medications: No change.  I) continue NovoLog Mix 70/30 : 8 units in the morning. II) continue Jardiance 12.5 mg daily.   - Home glucose testing: At least 2 times a day in the morning and  at bedtime.  [name] is asked to bring glucometer in the follow-up visit. - Discussed/ Gave Hypoglycemia treatment plan.  # Consult : not required at this time.   # Annual urine for microalbuminuria/ creatinine ratio, no microalbuminuria currently.  Will check urine microalbumin creatinine ratio today.   Last  Lab Results  Component Value Date   MICRALBCREAT 10.0 07/21/2018    # Foot check nightly / neuropathy.  # Annual dilated diabetic eye exams.   - Diet: Make healthy diabetic food choices - Life style / activity / exercise: Discussed as tolerated.  2. Blood pressure  -  BP Readings from Last 1 Encounters:  06/03/23 138/60    - Control is in target.  - No change in current plans.  3. Lipid status / Hyperlipidemia - Last  Lab Results  Component Value Date   LDLCALC 51 06/16/2022   - Continue atorvastatin 80 mg daily.  Diagnoses and all orders for this visit:  Type 2 diabetes mellitus with other specified complication, with long-term current use of insulin (HCC) -     POCT glycosylated hemoglobin (Hb A1C) -     Microalbumin / creatinine urine ratio; Future -     empagliflozin (JARDIANCE) 25 MG TABS tablet; Take half tablet daily.     DISPOSITION Follow up in clinic in 3 months suggested.   All questions answered and [name] verbalized understanding of the plan.  Joseph Chris Cripps, MD [facility] End[facility] [address] [place], [address] [place], [place] [address] Phone # [phone]  At least part of this note was generated using voice recognition software. Inadvertent word errors may have occurred, which were not recognized during the proofreading process.

## 2023-06-04 ENCOUNTER — Other Ambulatory Visit: Payer: Self-pay

## 2023-06-07 ENCOUNTER — Ambulatory Visit (INDEPENDENT_AMBULATORY_CARE_PROVIDER_SITE_OTHER): Payer: Medicare Other | Admitting: Internal Medicine

## 2023-06-07 ENCOUNTER — Encounter: Payer: Self-pay | Admitting: Internal Medicine

## 2023-06-07 VITALS — BP 130/70 | Temp 98.1°F | Wt 192.1 lb

## 2023-06-07 DIAGNOSIS — E7800 Pure hypercholesterolemia, unspecified: Secondary | ICD-10-CM | POA: Diagnosis not present

## 2023-06-07 DIAGNOSIS — I10 Essential (primary) hypertension: Secondary | ICD-10-CM | POA: Diagnosis not present

## 2023-06-07 DIAGNOSIS — E1165 Type 2 diabetes mellitus with hyperglycemia: Secondary | ICD-10-CM | POA: Diagnosis not present

## 2023-06-07 NOTE — Patient Instructions (Signed)
Keep up the good work

## 2023-06-07 NOTE — Progress Notes (Signed)
Established Patient Office Visit     CC/Reason for Visit: Follow-up chronic medical conditions  HPI: [name] is a 67 y.o. male who is coming in today for the above mentioned reasons. Past Medical History is significant for: Hypertension, hyperlipidemia, type 2 diabetes, prostate cancer, rheumatoid arthritis, history of coronary artery disease.  He is feeling well today and has no acute concerns or complaints.   Past Medical/Surgical History: Past Medical History:  Diagnosis Date   Anemia    Benign localized prostatic hyperplasia with lower urinary tract symptoms (LUTS)    CAD (coronary artery disease)    cardiologist--- dr Swaziland;   11-27-2020 cardiac cath for abnormal cardiolite,  severe 3V disease;   11-30-2000 s/p CABG x4;  cath 04-21-2005 patent SVG-OM1 & SVG --PDA graft's,  occluded radial graft -- OM, mod to sev LV dysfunction,  not candidate for revascularization   Chronic diastolic CHF (congestive heart failure) (HCC)    followed by cardiologist   Chronic kidney disease (CKD), stage IV (severe) (HCC)    nephrologist--- dr Marisue Humble   (lov note scanned in [place] , epic, dated 09-26-2020)   Dilated cardiomyopathy (HCC)    echo in 2009 showed improvement with a normal EF   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 03-08-2021   (09-03-2021  per pt wife is resolving)   History of COVID-19    spring 2022 with mild symptoms that resolved   History of pulmonary embolism 03/13/2020   complete blood thinner   History of sepsis    hx admission for severe sepsis twice;  admitted 03-13-2020 secondary to aspiration pneumonia w/ ARF and PE;   admitted 10-07-2020 secondary to MSSA uti   History of stroke    per imaging 05-07-2021 head CT ,  remote infarcts left thalamus, basal ganglia   HTN (hypertension)    Hyperlipemia    Ischemic heart disease    cardiologist--- dr Swaziland;   Malignant neoplasm prostate [facility]) 05/2021   urologist-- dr eskridge/   oncologist--- dr Clelia Croft;  dx 11/ 2022,  Gleason 9, PSA 14   Multiple myeloma in remission [facility]) 2021   oncologist--- dr Candise Che--- dx 2021, IgG Kappa type, completed chemo 2021, in remission since   OA (osteoarthritis)    PAD (peripheral artery disease) (HCC)    RA (rheumatoid arthritis) [facility])    rheumatologist--- dr s. Corliss Skains---  multiple sites,  no other treatment other than takes tylenol arthritis with flare-ups   Tympanic membrane central perforation, left    followed by dr bates (ent)  chronic since 08/ 2022   (09-03-2021  per pt wife has partially closed)   Type 2 diabetes mellitus [facility])    endocrinologist--- dr a Lucianne Muss   Uses walker    Wears glasses     Past Surgical History:  Procedure Laterality Date   CARDIAC CATHETERIZATION  11/27/2000   @[facility]  by Dr Swaziland;   severe 3V disease,  ef 15-25%   CARDIAC CATHETERIZATION  04/21/2005   by Dr Swaziland;    patnet SVG -- OM1 & SVG --PDA grafts,  occluded radial graft --OM1,  atretic LIMA -- Diag,  moderate to severe LV dysfunction,  not candidate for revascularization   CATARACT EXTRACTION W/ INTRAOCULAR LENS IMPLANT Right 08/21/2021   CORONARY ARTERY BYPASS GRAFT  11/30/2000   @[facility]  by Dr Morton Peters;   LIMA -- Diag,  radial artery -- OM1,  SVG -- OM,  SVG -- RCA  GOLD SEED IMPLANT N/A 09/05/2021   Procedure: GOLD SEED IMPLANT;  Surgeon: Jerilee Field, MD;  [facility];  Service: Urology;  Laterality: N/A;  45 MINS FOR CASE   IR FLUORO GUIDED NEEDLE PLC ASPIRATION/INJECTION LOC  10/24/2019   SPACE OAR INSTILLATION N/A 09/05/2021   Procedure: SPACE OAR INSTILLATION;  Surgeon: Jerilee Field, MD;  Location: [facility];  Service: Urology;  Laterality: N/A;   TRANSURETHRAL RESECTION OF PROSTATE N/A 01/06/2021   Procedure: CYSTOSCOPY WITH CYSTOGRAM/TRANSURETHRAL RESECTION OF THE PROSTATE (TURP);  Surgeon: Crista Elliot, MD;  Location: [facility];  Service: Urology;  Laterality: N/A;    Social  History:  reports that he has never smoked. He has never used smokeless tobacco. He reports that he does not currently use alcohol. He reports that he does not use drugs.  Allergies: Allergies  Allergen Reactions   Metformin And Related Diarrhea    Family History:  Family History  Problem Relation Age of Onset   Hypertension Father    Diabetes Mother        DIABETIC COMA   Healthy Daughter      Current Outpatient Medications:    Accu-Chek Softclix Lancets lancets, USE ACCU CHEK SOFTCLIX LANCETS TO CHECK BLOOD SUGAR FASTING OR 2 HOURS AFTER A MEAL EVERY OTHER DAY., Disp: 100 each, Rfl: 3   allopurinol (ZYLOPRIM) 100 MG tablet, Take 100 mg by mouth daily., Disp: , Rfl:    amLODipine (NORVASC) 10 MG tablet, Take 1 tablet by mouth once daily, Disp: 90 tablet, Rfl: 0   aspirin EC 81 MG tablet, Take 81 mg by mouth daily. Swallow whole., Disp: , Rfl:    atorvastatin (LIPITOR) 80 MG tablet, Take 1 tablet (80 mg total) by mouth daily., Disp: 90 tablet, Rfl: 3   Blood Glucose Monitoring Suppl (ACCU-CHEK GUIDE ME) w/Device KIT, 1 each by Does not apply route., Disp: , Rfl:    carvedilol (COREG) 25 MG tablet, TAKE 1 TABLET BY MOUTH TWICE DAILY WITH A MEAL, Disp: 180 tablet, Rfl: 2   cyanocobalamin (VITAMIN B12) 500 MCG tablet, Take 500 mcg by mouth daily., Disp: , Rfl:    dexamethasone (DECADRON) 0.1 % ophthalmic suspension, , Disp: , Rfl:    empagliflozin (JARDIANCE) 25 MG TABS tablet, Take half tablet daily., Disp: 45 tablet, Rfl: 3   glucose blood (ACCU-CHEK GUIDE) test strip, USE 1  TO CHECK GLUCOSE ONCE DAILY, Disp: 100 each, Rfl: 0   hydrALAZINE (APRESOLINE) 50 MG tablet, Take 1 tablet (50 mg total) by mouth 3 (three) times daily., Disp: 270 tablet, Rfl: 3   hydroxychloroquine (PLAQUENIL) 200 MG tablet, Take 400 mg by mouth daily., Disp: , Rfl:    hydroxypropyl methylcellulose / hypromellose (ISOPTO TEARS / GONIOVISC) 2.5 % ophthalmic solution, Place 2 drops into the left eye as needed for  dry eyes., Disp: 15 mL, Rfl: 2   insulin aspart protamine - aspart (NOVOLOG MIX 70/30 FLEXPEN) (70-30) 100 UNIT/ML FlexPen, Inject 8 Units into the skin daily with breakfast. Upto 20 units before breakfast as directed, Disp: 6 mL, Rfl: 4   Insulin Pen Needle 31G X 5 MM MISC, Use for pen, Disp: 100 each, Rfl: 1   isosorbide mononitrate (IMDUR) 30 MG 24 hr tablet, Take 1 tablet (30 mg total) by mouth daily., Disp: 90 tablet, Rfl: 3   KLOR-CON M20 20 MEQ tablet, Take 1 tablet by mouth once daily, Disp: 30 tablet, Rfl: 0 No current facility-administered medications for this visit.  Facility-Administered Medications Ordered in Other Visits:    sodium chloride flush (NS) 0.9 % injection 10 mL, 10 mL, Intravenous, PRN, Candise Che, Corene Cornea, MD   sodium chloride flush (NS) 0.9 % injection 10 mL, 10 mL, Intracatheter, Once PRN, Johney Maine, MD  Review of Systems:  Negative unless indicated in HPI.   Physical Exam: Vitals:   06/07/23 1034  BP: 130/70  Temp: 98.1 F (36.7 C)  TempSrc: Oral  Weight: 192 lb 1.6 oz (87.1 kg)    Body mass index is 28.37 kg/m.   Physical Exam Vitals reviewed.  Constitutional:      Appearance: Normal appearance.  HENT:     Head: Normocephalic and atraumatic.  Eyes:     Conjunctiva/sclera: Conjunctivae normal.     Pupils: Pupils are equal, round, and reactive to light.  Cardiovascular:     Rate and Rhythm: Normal rate and regular rhythm.  Pulmonary:     Effort: Pulmonary effort is normal.     Breath sounds: Normal breath sounds.  Skin:    General: Skin is warm and dry.  Neurological:     General: No focal deficit present.     Mental Status: He is alert and oriented to person, place, and time.  Psychiatric:        Mood and Affect: Mood normal.        Behavior: Behavior normal.        Thought Content: Thought content normal.        Judgment: Judgment normal.      Impression and Plan:  Uncontrolled type 2 diabetes mellitus with  hyperglycemia (HCC)  Primary hypertension  Pure hypercholesterolemia   -Blood pressure is well-controlled on current. -A1c of 6.8 demonstrates excellent diabetic control. -Last LDL was 51 which is at goal on atorvastatin.  Time spent:31 minutes reviewing chart, interviewing and examining patient and formulating plan of care.     Chaya Jan, MD [facility]

## 2023-06-12 ENCOUNTER — Other Ambulatory Visit: Payer: Self-pay | Admitting: Cardiology

## 2023-06-12 ENCOUNTER — Other Ambulatory Visit: Payer: Self-pay | Admitting: Endocrinology

## 2023-07-03 ENCOUNTER — Other Ambulatory Visit: Payer: Self-pay | Admitting: Cardiology

## 2023-07-04 ENCOUNTER — Other Ambulatory Visit: Payer: Self-pay | Admitting: Cardiology

## 2023-07-05 ENCOUNTER — Telehealth: Payer: Self-pay

## 2023-07-05 NOTE — Telephone Encounter (Signed)
Spoke with pt gave consent to fill pt's portion on [employer] (Nvologon 70/30 and pen needles)and fax Dr office to fill out Dr portion.

## 2023-07-11 ENCOUNTER — Other Ambulatory Visit: Payer: Self-pay | Admitting: Endocrinology

## 2023-07-15 ENCOUNTER — Other Ambulatory Visit: Payer: Self-pay | Admitting: Cardiology

## 2023-07-22 ENCOUNTER — Other Ambulatory Visit: Payer: Self-pay | Admitting: Cardiology

## 2023-07-23 NOTE — Telephone Encounter (Signed)
 Following up on PAP Novo Nordisk (Novolog 70/30),call [facility] to check on process of the pap,they ask to re [facility] providers portion b/c it was missing the medication portion, have re fax to dr office and will follow up.

## 2023-07-26 ENCOUNTER — Telehealth: Payer: Self-pay | Admitting: Cardiology

## 2023-07-26 ENCOUNTER — Other Ambulatory Visit (HOSPITAL_COMMUNITY): Payer: Self-pay

## 2023-07-26 MED ORDER — HYDRALAZINE HCL 50 MG PO TABS
50.0000 mg | ORAL_TABLET | Freq: Three times a day (TID) | ORAL | 0 refills | Status: DC
  Filled 2023-07-26: qty 45, 15d supply, fill #0

## 2023-07-26 MED ORDER — CARVEDILOL 25 MG PO TABS
25.0000 mg | ORAL_TABLET | Freq: Two times a day (BID) | ORAL | 0 refills | Status: DC
  Filled 2023-07-26: qty 30, 15d supply, fill #0

## 2023-07-26 NOTE — Telephone Encounter (Signed)
*  STAT* If patient is at the pharmacy, call can be transferred to refill team.   1. Which medications need to be refilled? (please list name of each medication and dose if known)   carvedilol  (COREG ) 25 MG tablet  hydrALAZINE  (APRESOLINE ) 50 MG tablet   2. Would you like to learn more about the convenience, safety, & potential cost savings by using the [facility]?   3. Are you open to using the [facility] (Type [facility]. ).  4. Which pharmacy/location (including street and city if local pharmacy) is medication to be sent to?  [id number] - [place] ([place]), [place] - [address]   5. Do they need a 30 day or 90 day supply?    90 day  Patient stated he is completely out of this medication.

## 2023-07-26 NOTE — Progress Notes (Signed)
Cardiology Office Note:    Date:  08/03/2023   ID:  Airrion, Vose [birth year], MRN [id number]  PCP:  [name], [name], MD   [facility] Providers Cardiologist:  Lacharles Altschuler [place], MD     Referring MD: [name], Estel*   Chief Complaint  Patient presents with   Coronary Artery Disease    History of Present Illness:    Perseus Joswiak is a 68 y.o. male with a hx of CAD s/p CABG, HTN, HLD, DM 2, dilated cardiomyopathy and longstanding past noncompliance with follow-up and meds.  Cardiac catheterization in 2006 demonstrated coronary artery disease, however he was not a candidate for further revascularization.  Remote CTA of abdomen showed 50% left renal artery stenosis in 2006.  Follow-up renal artery ultrasound was okay.  Echocardiogram in 2009 showed significant improvement in LV function and EF was normal.  He was admitted in May 2019 with significant hypertension and CHF requiring aggressive diuresis.  Echocardiogram at the time showed EF 50 to 55%.  He has been followed by hematology service for multiple myeloma.  He was diagnosed with a small PE in October 2021 and was on a short course of Eliquis.  Patient was admitted in March 2022 with anorexia and 15 pound weight loss at the recommendation of oncology service.  Creatinine was elevated at 2.81.  CT of abdomen and pelvis revealed severe bilateral hydroureteronephrosis and irregularly thickened lobular urinary bladder with marked irregular mural thickening as well as non-mass-like extension from the bladder dome.  Urology was consulted who recommended continuing Foley catheter until follow-up as outpatient. He underwent cystoscopy and TURP in June 2022.  He was last seen by Dr. Swaziland on 04/28/2021 at which point he was stable from the cardiac perspective. He has been diagnosed with prostate cancer.  PET scan obtained in December 2022 showed no evidence of metastatic disease.  He underwent gold seed implant by  urology service in February 2023 and has been receiving radiation therapy.  Patient presents today for follow-up.   Overall, he is doing well without any chest pain or worsening dyspnea.  He has no lower extremity edema, orthopnea or PND.  He reports his BP at home has been well controlled 120-130/60.  He is walking and plays drums at his church.   Past Medical History:  Diagnosis Date   Anemia    Benign localized prostatic hyperplasia with lower urinary tract symptoms (LUTS)    CAD (coronary artery disease)    cardiologist--- dr Swaziland;   11-27-2020 cardiac cath for abnormal cardiolite,  severe 3V disease;   11-30-2000 s/p CABG x4;  cath 04-21-2005 patent SVG-OM1 & SVG --PDA graft's,  occluded radial graft -- OM, mod to sev LV dysfunction,  not candidate for revascularization   Chronic diastolic CHF (congestive heart failure) (HCC)    followed by cardiologist   Chronic kidney disease (CKD), stage IV (severe) [facility])    nephrologist--- dr Marisue Humble   (lov note scanned in [place] , epic, dated 09-26-2020)   Dilated cardiomyopathy (HCC)    echo in 2009 showed improvement with a normal EF   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 03-08-2021   (09-03-2021  per pt wife is resolving)   History of COVID-19    spring 2022 with mild symptoms that resolved   History of pulmonary embolism 03/13/2020   complete blood thinner   History of sepsis    hx admission for severe sepsis twice;  admitted 03-13-2020 secondary to aspiration pneumonia w/ ARF and PE;   admitted 10-07-2020 secondary to MSSA uti   History of stroke    per imaging 05-07-2021 head CT ,  remote infarcts left thalamus, basal ganglia   HTN (hypertension)    Hyperlipemia    Ischemic heart disease    cardiologist--- dr Swaziland;   Malignant neoplasm prostate [facility]) 05/2021   urologist-- dr eskridge/  oncologist--- dr Clelia Croft;  dx 11/ 2022,  Gleason 9, PSA 14   Multiple myeloma in remission [facility]) 2021   oncologist--- dr  Candise Che--- dx 2021, IgG Kappa type, completed chemo 2021, in remission since   OA (osteoarthritis)    PAD (peripheral artery disease) (HCC)    RA (rheumatoid arthritis) [facility])    rheumatologist--- dr s. Corliss Skains---  multiple sites,  no other treatment other than takes tylenol arthritis with flare-ups   Tympanic membrane central perforation, left    followed by dr bates (ent)  chronic since 08/ 2022   (09-03-2021  per pt wife has partially closed)   Type 2 diabetes mellitus [facility])    endocrinologist--- dr a Lucianne Muss   Uses walker    Wears glasses     Past Surgical History:  Procedure Laterality Date   CARDIAC CATHETERIZATION  11/27/2000   @[facility]  by Dr Swaziland;   severe 3V disease,  ef 15-25%   CARDIAC CATHETERIZATION  04/21/2005   by Dr Swaziland;    patnet SVG -- OM1 & SVG --PDA grafts,  occluded radial graft --OM1,  atretic LIMA -- Diag,  moderate to severe LV dysfunction,  not candidate for revascularization   CATARACT EXTRACTION W/ INTRAOCULAR LENS IMPLANT Right 08/21/2021   CORONARY ARTERY BYPASS GRAFT  11/30/2000   @[facility]  by Dr Morton Peters;   LIMA -- Diag,  radial artery -- OM1,  SVG -- OM,  SVG -- RCA   GOLD SEED IMPLANT N/A 09/05/2021   Procedure: GOLD SEED IMPLANT;  Surgeon: Jerilee Field, MD;  Location: [facility];  Service: Urology;  Laterality: N/A;  45 MINS FOR CASE   IR FLUORO GUIDED NEEDLE PLC ASPIRATION/INJECTION LOC  10/24/2019   SPACE OAR INSTILLATION N/A 09/05/2021   Procedure: SPACE OAR INSTILLATION;  Surgeon: Jerilee Field, MD;  Location: [facility];  Service: Urology;  Laterality: N/A;   TRANSURETHRAL RESECTION OF PROSTATE N/A 01/06/2021   Procedure: CYSTOSCOPY WITH CYSTOGRAM/TRANSURETHRAL RESECTION OF THE PROSTATE (TURP);  Surgeon: Crista Elliot, MD;  Location: [facility];  Service: Urology;  Laterality: N/A;    Current Medications: Current Meds  Medication Sig   Accu-Chek Softclix Lancets lancets USE ACCU CHEK SOFTCLIX LANCETS TO CHECK  BLOOD SUGAR FASTING OR 2 HOURS AFTER A MEAL EVERY OTHER DAY.   allopurinol (ZYLOPRIM) 100 MG tablet Take 100 mg by mouth daily.   amLODipine (NORVASC) 10 MG tablet Take 1 tablet by mouth once daily   aspirin EC 81 MG tablet Take 81 mg by mouth daily. Swallow whole.   atorvastatin (LIPITOR) 80 MG tablet Take 1 tablet (80 mg total) by mouth daily.   Blood Glucose Monitoring Suppl (ACCU-CHEK GUIDE ME) w/Device KIT 1 each by Does not apply route.   carvedilol (COREG) 25 MG tablet Take 1 tablet (25 mg total) by mouth 2 (two) times daily with a meal.   cyanocobalamin (VITAMIN B12) 500 MCG tablet Take 500 mcg by mouth daily.   dexamethasone (DECADRON) 0.1 % ophthalmic suspension    empagliflozin (JARDIANCE) 25 MG TABS tablet Take half tablet daily.  glucose blood (ACCU-CHEK GUIDE TEST) test strip Check blood sugar 2x daily   glucose blood (ACCU-CHEK GUIDE) test strip USE 1  TO CHECK GLUCOSE ONCE DAILY   Glucose Blood (BLOOD GLUCOSE TEST STRIPS) STRP 1 each by In Vitro route in the morning and at bedtime. May substitute to any manufacturer covered by patient's insurance.   hydrALAZINE (APRESOLINE) 50 MG tablet Take 1 tablet (50 mg total) by mouth 3 (three) times daily.   hydroxychloroquine (PLAQUENIL) 200 MG tablet Take 400 mg by mouth daily.   hydroxypropyl methylcellulose / hypromellose (ISOPTO TEARS / GONIOVISC) 2.5 % ophthalmic solution Place 2 drops into the left eye as needed for dry eyes.   insulin aspart protamine - aspart (NOVOLOG MIX 70/30 FLEXPEN) (70-30) 100 UNIT/ML FlexPen Inject 8 Units into the skin daily with breakfast. Upto 20 units before breakfast as directed   Insulin Pen Needle 31G X 5 MM MISC Use for pen   isosorbide mononitrate (IMDUR) 30 MG 24 hr tablet Take 1 tablet (30 mg total) by mouth daily. Please keep scheduled appointment for future refills. Thank you.   potassium chloride SA (KLOR-CON M20) 20 MEQ tablet Take 1 tablet by mouth once daily     Allergies:   Metformin and  related   Social History   Socioeconomic History   Marital status: Married    Spouse name: Not on file   Number of children: 1   Years of education: Not on file   Highest education level: 12th grade  Occupational History   Occupation: Chartered certified accountant  Tobacco Use   Smoking status: Never   Smokeless tobacco: Never  Vaping Use   Vaping status: Never Used  Substance and Sexual Activity   Alcohol use: Not Currently   Drug use: Never   Sexual activity: Yes  Other Topics Concern   Not on file  Social History Narrative   ** Merged History Encounter **       Social Drivers of Health   Financial Resource Strain: Low Risk  (06/03/2023)   Overall Financial Resource Strain (CARDIA)    Difficulty of Paying Living Expenses: Not hard at all  Food Insecurity: No Food Insecurity (06/03/2023)   Hunger Vital Sign    Worried About Running Out of Food in the Last Year: Never true    Ran Out of Food in the Last Year: Never true  Transportation Needs: No Transportation Needs (06/03/2023)   PRAPARE - Administrator, Civil Service (Medical): No    Lack of Transportation (Non-Medical): No  Physical Activity: Insufficiently Active (06/03/2023)   Exercise Vital Sign    Days of Exercise per Week: 3 days    Minutes of Exercise per Session: 20 min  Stress: No Stress Concern Present (06/03/2023)   [employer] of Occupational Health - Occupational Stress Questionnaire    Feeling of Stress : Not at all  Social Connections: Socially Integrated (06/03/2023)   Social Connection and Isolation Panel [NHANES]    Frequency of Communication with Friends and Family: Twice a week    Frequency of Social Gatherings with Friends and Family: Twice a week    Attends Religious Services: More than 4 times per year    Active Member of [employer] or Organizations: Yes    Attends Engineer, structural: More than 4 times per year    Marital Status: Married     Family History: The patient's family  history includes Diabetes in his mother; Healthy in his daughter; Hypertension in his father.  ROS:   Please see the history of present illness.     All other systems reviewed and are negative.  EKGs/Labs/Other Studies Reviewed:    The following studies were reviewed today:  Echo 03/14/2020  1. Left ventricular ejection fraction, by estimation, is 55 to 60%. The  left ventricle has normal function. The left ventricle has no regional  wall motion abnormalities. Left ventricular diastolic parameters were  normal.   2. Right ventricular systolic function is normal. The right ventricular  size is normal.   3. Left atrial size was mildly dilated.   4. The mitral valve is normal in structure. Trivial mitral valve  regurgitation. No evidence of mitral stenosis.   5. The aortic valve is normal in structure. Aortic valve regurgitation is  trivial. No aortic stenosis is present.   6. The inferior vena cava is normal in size with greater than 50%  respiratory variability, suggesting right atrial pressure of 3 mmHg.   EKG Interpretation Date/Time:  Tuesday August 03 2023 16:02:10 EST Ventricular Rate:  72 PR Interval:  176 QRS Duration:  82 QT Interval:  432 QTC Calculation: 473 R Axis:   -52  Text Interpretation: Sinus rhythm with occasional Premature ventricular complexes and Premature atrial complexes Left axis deviation Septal infarct , age undetermined When compared with ECG of 08-Mar-2021 07:52, No significant change was found Confirmed by [place], Rubert Frediani [phone]) on 08/03/2023 4:16:24 PM    Recent Labs: 02/18/2023: BUN 19; Creatinine, Ser 1.46; Potassium 3.7; Sodium 141  Recent Lipid Panel    Component Value Date/Time   CHOL 116 06/16/2022 0917   TRIG 128 06/16/2022 0917   HDL 42 06/16/2022 0917   CHOLHDL 2.8 06/16/2022 0917   CHOLHDL 2 07/21/2018 1507   VLDL 13.0 07/21/2018 1507   LDLCALC 51 06/16/2022 0917   LDLDIRECT 50.0 06/08/2022 1011     Risk Assessment/Calculations:            Physical Exam:    VS:  BP (!) 156/72 (Cuff Size: Large)   Pulse 72   Ht 5\' 9"  (1.753 m)   Wt 196 lb (88.9 kg)   SpO2 96%   BMI 28.94 kg/m     Wt Readings from Last 3 Encounters:  08/03/23 196 lb (88.9 kg)  06/07/23 192 lb 1.6 oz (87.1 kg)  06/03/23 192 lb 12.8 oz (87.5 kg)     GEN:  Well nourished, well developed in no acute distress HEENT: Normal NECK: No JVD; No carotid bruits LYMPHATICS: No lymphadenopathy CARDIAC: RRR, no murmurs, rubs, gallops RESPIRATORY:  Clear to auscultation without rales, wheezing or rhonchi  ABDOMEN: Soft, non-tender, non-distended MUSCULOSKELETAL:  No edema; No deformity  SKIN: Warm and dry NEUROLOGIC:  Alert and oriented x 3 PSYCHIATRIC:  Normal affect   HYPERTENSION CONTROL Vitals:   08/03/23 1559 08/03/23 1623  BP: (!) 180/76 (!) 156/72    The patient's blood pressure is elevated above target today.  In order to address the patient's elevated BP: Blood pressure will be monitored at home to determine if medication changes need to be made.       ASSESSMENT:    1. Coronary artery disease involving coronary bypass graft of native heart without angina pectoris   2. Essential hypertension   3. Hyperlipidemia LDL goal <70   4. Controlled type 2 diabetes mellitus without complication, without long-term current use of insulin (HCC)      PLAN:    In order of problems listed above:  CAD s/p CABG: Denies  any angina. On aspirin and Lipitor  Hyperlipidemia: On Lipitor 80 mg daily. Last LDL is at goal 51. Recommend he have this checked yearly with PCP  Hypertension: Blood pressure is elevated today but home readings have been excellent. Continue current therapy and monitor.    DM2: Managed by primary care provider. A1c 6.8%.   History of multiple myeloma: In remission.  Followed by hematology/oncology service.  6. RA now on Plaquenil     Follow up in one year      Medication Adjustments/Labs and Tests  Ordered: Current medicines are reviewed at length with the patient today.  Concerns regarding medicines are outlined above.  Orders Placed This Encounter  Procedures   EKG 12-Lead   No orders of the defined types were placed in this encounter.   There are no Patient Instructions on file for this visit.   Signed, Corynn Solberg [place], MD  08/03/2023 4:24 PM    [facility]

## 2023-07-28 ENCOUNTER — Telehealth: Payer: Self-pay | Admitting: Internal Medicine

## 2023-07-28 DIAGNOSIS — E1165 Type 2 diabetes mellitus with hyperglycemia: Secondary | ICD-10-CM

## 2023-07-28 MED ORDER — BLOOD GLUCOSE TEST VI STRP: 1.0000 | ORAL_STRIP | Freq: Two times a day (BID) | 3 refills | Status: AC

## 2023-07-28 NOTE — Telephone Encounter (Signed)
 Patient states he is out of his Accu Check Guide test strips as of today. Asked patient to call his endo office to request refills as they prescribe for him. Routing to prescriber as well.

## 2023-07-28 NOTE — Telephone Encounter (Signed)
 Copied from CRM [id number]. Topic: Clinical - Medication Question >> Jul 28, 2023  2:38 PM Isabell A wrote: Reason for CRM: Patient calling to speak with pharmacist in regard to his test strips to test his blood sugar.

## 2023-07-28 NOTE — Telephone Encounter (Signed)
 Sent prescription

## 2023-07-29 ENCOUNTER — Other Ambulatory Visit: Payer: Self-pay

## 2023-07-29 MED ORDER — ACCU-CHEK GUIDE TEST VI STRP: ORAL_STRIP | 12 refills | Status: DC

## 2023-08-02 ENCOUNTER — Other Ambulatory Visit: Payer: Self-pay | Admitting: Cardiology

## 2023-08-02 NOTE — Telephone Encounter (Signed)
Received provider portion on [employer] (Novolog mix) and mail entire PAP to [employer] will follow up on there answer.

## 2023-08-03 ENCOUNTER — Ambulatory Visit: Payer: Medicare Other | Attending: Cardiology | Admitting: Cardiology

## 2023-08-03 ENCOUNTER — Encounter: Payer: Self-pay | Admitting: Cardiology

## 2023-08-03 VITALS — BP 156/72 | HR 72 | Ht 69.0 in | Wt 196.0 lb

## 2023-08-03 DIAGNOSIS — I25810 Atherosclerosis of coronary artery bypass graft(s) without angina pectoris: Secondary | ICD-10-CM | POA: Diagnosis present

## 2023-08-03 DIAGNOSIS — I10 Essential (primary) hypertension: Secondary | ICD-10-CM | POA: Diagnosis present

## 2023-08-03 DIAGNOSIS — E119 Type 2 diabetes mellitus without complications: Secondary | ICD-10-CM | POA: Diagnosis present

## 2023-08-03 DIAGNOSIS — E785 Hyperlipidemia, unspecified: Secondary | ICD-10-CM | POA: Diagnosis present

## 2023-08-03 NOTE — Patient Instructions (Signed)
Medication Instructions:  Continue same medications *If you need a refill on your cardiac medications before your next appointment, please call your pharmacy*   Lab Work: None ordered   Testing/Procedures: None ordered   Follow-Up: At [facility], you and your health needs are our priority.  As part of our continuing mission to provide you with exceptional heart care, we have created designated Provider Care Teams.  These Care Teams include your primary Cardiologist (physician) and Advanced Practice Providers (APPs -  Physician Assistants and Nurse Practitioners) who all work together to provide you with the care you need, when you need it.  We recommend signing up for the patient portal called "MyChart".  Sign up information is provided on this After Visit Summary.  MyChart is used to connect with patients for Virtual Visits (Telemedicine).  Patients are able to view lab/test results, encounter notes, upcoming appointments, etc.  Non-urgent messages can be sent to your provider as well.   To learn more about what you can do with MyChart, go to [email or web address].    Your next appointment:  1 year    Call in Oct to schedule Jan appointment  [facility] )    Provider:  Dr.Jordan

## 2023-08-07 ENCOUNTER — Other Ambulatory Visit: Payer: Self-pay | Admitting: Cardiology

## 2023-08-11 ENCOUNTER — Telehealth: Payer: Self-pay | Admitting: Cardiology

## 2023-08-11 ENCOUNTER — Other Ambulatory Visit: Payer: Self-pay | Admitting: Cardiology

## 2023-08-11 ENCOUNTER — Other Ambulatory Visit (HOSPITAL_COMMUNITY): Payer: Self-pay

## 2023-08-11 MED ORDER — CARVEDILOL 25 MG PO TABS
25.0000 mg | ORAL_TABLET | Freq: Two times a day (BID) | ORAL | 3 refills | Status: AC
  Filled 2023-08-11: qty 180, 90d supply, fill #0
  Filled 2023-11-27: qty 180, 90d supply, fill #1
  Filled 2024-02-22: qty 180, 90d supply, fill #2
  Filled 2024-05-25: qty 180, 90d supply, fill #3

## 2023-08-11 MED ORDER — HYDRALAZINE HCL 50 MG PO TABS
50.0000 mg | ORAL_TABLET | Freq: Three times a day (TID) | ORAL | 3 refills | Status: AC
  Filled 2023-08-11: qty 270, 90d supply, fill #0
  Filled 2023-11-27: qty 270, 90d supply, fill #1
  Filled 2024-02-22: qty 270, 90d supply, fill #2
  Filled 2024-05-25: qty 270, 90d supply, fill #3

## 2023-08-11 NOTE — Telephone Encounter (Signed)
*  STAT* If patient is at the pharmacy, call can be transferred to refill team.   1. Which medications need to be refilled? (please list name of each medication and dose if known)   hydrALAZINE (APRESOLINE) 50 MG tablet  carvedilol (COREG) 25 MG tablet     3. Are you open to using the [facility]).   4. Which pharmacy/location (including street and city if local pharmacy) is medication to be sent to? [facility] Phone: [phone]  Fax: [phone]       5. Do they need a 30 day or 90 day supply? 90

## 2023-08-11 NOTE — Telephone Encounter (Signed)
Pt's medications were sent to pt's pharmacy as requested. Confirmation received.

## 2023-08-13 ENCOUNTER — Other Ambulatory Visit: Payer: Self-pay | Admitting: Cardiology

## 2023-08-19 NOTE — Telephone Encounter (Signed)
 Resending entired application to [employer] do to not showing pt had uncheck boxeson appliation. Will follow up in a few day.

## 2023-08-20 ENCOUNTER — Telehealth: Payer: Self-pay | Admitting: Cardiology

## 2023-08-20 NOTE — Telephone Encounter (Signed)
 Pt's medication was already sent to pt's pharmacy as requested. Confirmation received.

## 2023-08-20 NOTE — Telephone Encounter (Signed)
*  STAT* If patient is at the pharmacy, call can be transferred to refill team.   1. Which medications need to be refilled? (please list name of each medication and dose if known)   isosorbide  mononitrate (IMDUR ) 30 MG 24 hr tablet   2. Would you like to learn more about the convenience, safety, & potential cost savings by using the [facility]?   3. Are you open to using the [facility] (Type [facility]. ).   4. Which pharmacy/location (including street and city if local pharmacy) is medication to be sent to?  [facility] - [place] ([place]), [place] - [address]   5. Do they need a 30 day or 90 day supply?   90 day  Patient stated he is completely out of this medication.

## 2023-08-23 ENCOUNTER — Telehealth: Payer: Self-pay

## 2023-08-23 NOTE — Telephone Encounter (Signed)
 Gave [employer] a call following up on pt pap Novo Nordisk (Novolog  70/30 and pen needles) pt has been APPPROVED for 2025.and will mail out to Dr office with in 14 days.

## 2023-08-23 NOTE — Telephone Encounter (Signed)
 Pt call back regarding needing Refills on his teststrip and lancet pt ask if we can get in contact with his Dr Kem Patten office to ask if Dr can  send a refill request to [facility] his pharmacy, left a msg at Dr office.will follow up

## 2023-08-25 ENCOUNTER — Other Ambulatory Visit: Payer: Self-pay

## 2023-08-25 MED ORDER — ACCU-CHEK GUIDE TEST VI STRP: ORAL_STRIP | 12 refills | Status: AC

## 2023-08-25 NOTE — Telephone Encounter (Signed)
Following up on pt request for his test strips ,spoke with patient explain he has not heard anything back from pharmacy or Dr Skeet Latch a call they said they only need diagnosis code to bill his ins. From Dr Beverely Low Dr office a call and left a msg to follow up with [facility] on diagnosis code for pt to have his test strips.will follow up

## 2023-08-30 ENCOUNTER — Other Ambulatory Visit: Payer: Self-pay

## 2023-08-30 ENCOUNTER — Telehealth: Payer: Self-pay | Admitting: Endocrinology

## 2023-08-30 NOTE — Telephone Encounter (Signed)
Gave pt a call to see if he had received his test strips from [facility] pharmacy they were just waiting on override code from Dr office I left a phone msg on 2/12,pt explain he is still waiting on his test trips,call Dr office again and left a msg again to follow up with [employer] on override code for pt to received his test strip. Will follow up.

## 2023-08-30 NOTE — Telephone Encounter (Signed)
Tobi Bastos from [facility] called and said they had left a voicemail a week ago but that the patient is out of test strips and they need an overide code from the doctor so they can get filled. Its the [id number] - [place] ([place]), [place] - [address]

## 2023-08-31 ENCOUNTER — Other Ambulatory Visit: Payer: Self-pay | Admitting: Cardiology

## 2023-09-03 ENCOUNTER — Telehealth: Payer: Medicare Other | Admitting: Endocrinology

## 2023-09-03 ENCOUNTER — Encounter: Payer: Self-pay | Admitting: Endocrinology

## 2023-09-03 DIAGNOSIS — E1169 Type 2 diabetes mellitus with other specified complication: Secondary | ICD-10-CM | POA: Diagnosis not present

## 2023-09-03 DIAGNOSIS — Z794 Long term (current) use of insulin: Secondary | ICD-10-CM | POA: Diagnosis not present

## 2023-09-03 NOTE — Progress Notes (Signed)
Outpatient Endocrinology Note [place] Tristen Luce, MD  09/03/23  Patient's Name: [name]    DOB: 10-22-1955    MRN: [id number]  I connected with  Volney American on 09/03/23 by a video enabled telemedicine application and verified that I am speaking with the correct person using two identifiers.   I discussed the limitations of evaluation and management by telemedicine. The patient expressed understanding and agreed to proceed.   Provider location : office Patient location: home Reason for virtual visit: weather / winter storm                                                    REASON OF VISIT: Follow up for type 2 diabetes mellitus  PCP: [name], [name], MD  HISTORY OF PRESENT ILLNESS:   Zakariya Knickerbocker is a 68 y.o. old male with past medical history listed below, is here for follow up of type 2 diabetes mellitus.   Pertinent Diabetes History: He was diagnosed with type 2 diabetes mellitus in 2018.  Hemoglobin A1c was 8.8% at the time of diagnosis.  Chronic Diabetes Complications : Retinopathy: no. Last ophthalmology exam was done on annually, reportedly. Nephropathy: CKD IIIa Peripheral neuropathy: no Coronary artery disease: yes Stroke: yes  Relevant comorbidities and cardiovascular risk factors: Obesity: no There is no height or weight on file to calculate BMI.  Hypertension: yes Hyperlipidemia. Yes, on statin  Current / Home Diabetic regimen includes: NovoLog Mix 70/30 : 8 units in the morning daily. Jardiance 12.5 mg daily.  Prior diabetic medications: Jardiance was decreased from 25 mg due to elevated creatinine. Prandin, stopped due to nausea.  Glycemic data:   Patient reports blood sugar lately has been running in the range of 150-160 range. He has Accu-Chek guide meter.  Hypoglycemia: Patient has denied hypoglycemic episodes. Patient has hypoglycemia awareness.  Interval history  This is a virtual visit today.  Patient reports  blood sugar has been running in the range of 1 50-1 60 range.  Denies any hypoglycemia.  He has been taking insulin mix and Jardiance half tablet daily.  Reports compliance.  He has no other complaints today.  REVIEW OF SYSTEMS As per history of present illness.   PAST MEDICAL HISTORY: Past Medical History:  Diagnosis Date   Anemia    Benign localized prostatic hyperplasia with lower urinary tract symptoms (LUTS)    CAD (coronary artery disease)    cardiologist--- dr Swaziland;   11-27-2020 cardiac cath for abnormal cardiolite,  severe 3V disease;   11-30-2000 s/p CABG x4;  cath 04-21-2005 patent SVG-OM1 & SVG --PDA graft's,  occluded radial graft -- OM, mod to sev LV dysfunction,  not candidate for revascularization   Chronic diastolic CHF (congestive heart failure) (HCC)    followed by cardiologist   Chronic kidney disease (CKD), stage IV (severe) [facility])    nephrologist--- dr Marisue Humble   (lov note scanned in [place] , epic, dated 09-26-2020)   Dilated cardiomyopathy (HCC)    echo in 2009 showed improvement with a normal EF   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 03-08-2021   (09-03-2021  per pt wife is resolving)   History of COVID-19    spring 2022 with mild symptoms that resolved   History of pulmonary embolism 03/13/2020   complete  blood thinner   History of sepsis    hx admission for severe sepsis twice;  admitted 03-13-2020 secondary to aspiration pneumonia w/ ARF and PE;   admitted 10-07-2020 secondary to MSSA uti   History of stroke    per imaging 05-07-2021 head CT ,  remote infarcts left thalamus, basal ganglia   HTN (hypertension)    Hyperlipemia    Ischemic heart disease    cardiologist--- dr Swaziland;   Malignant neoplasm prostate [facility]) 05/2021   urologist-- dr eskridge/  oncologist--- dr Clelia Croft;  dx 11/ 2022,  Gleason 9, PSA 14   Multiple myeloma in remission [facility]) 2021   oncologist--- dr Candise Che--- dx 2021, IgG Kappa type, completed chemo 2021, in  remission since   OA (osteoarthritis)    PAD (peripheral artery disease) (HCC)    RA (rheumatoid arthritis) [facility])    rheumatologist--- dr s. Corliss Skains---  multiple sites,  no other treatment other than takes tylenol arthritis with flare-ups   Tympanic membrane central perforation, left    followed by dr bates (ent)  chronic since 08/ 2022   (09-03-2021  per pt wife has partially closed)   Type 2 diabetes mellitus [facility])    endocrinologist--- dr a Lucianne Muss   Uses walker    Wears glasses     PAST SURGICAL HISTORY: Past Surgical History:  Procedure Laterality Date   CARDIAC CATHETERIZATION  11/27/2000   @[facility]  by Dr Swaziland;   severe 3V disease,  ef 15-25%   CARDIAC CATHETERIZATION  04/21/2005   by Dr Swaziland;    patnet SVG -- OM1 & SVG --PDA grafts,  occluded radial graft --OM1,  atretic LIMA -- Diag,  moderate to severe LV dysfunction,  not candidate for revascularization   CATARACT EXTRACTION W/ INTRAOCULAR LENS IMPLANT Right 08/21/2021   CORONARY ARTERY BYPASS GRAFT  11/30/2000   @[facility]  by Dr Morton Peters;   LIMA -- Diag,  radial artery -- OM1,  SVG -- OM,  SVG -- RCA   GOLD SEED IMPLANT N/A 09/05/2021   Procedure: GOLD SEED IMPLANT;  Surgeon: Jerilee Field, MD;  Location: [facility];  Service: Urology;  Laterality: N/A;  45 MINS FOR CASE   IR FLUORO GUIDED NEEDLE PLC ASPIRATION/INJECTION LOC  10/24/2019   SPACE OAR INSTILLATION N/A 09/05/2021   Procedure: SPACE OAR INSTILLATION;  Surgeon: Jerilee Field, MD;  Location: [facility];  Service: Urology;  Laterality: N/A;   TRANSURETHRAL RESECTION OF PROSTATE N/A 01/06/2021   Procedure: CYSTOSCOPY WITH CYSTOGRAM/TRANSURETHRAL RESECTION OF THE PROSTATE (TURP);  Surgeon: Crista Elliot, MD;  Location: [facility];  Service: Urology;  Laterality: N/A;    ALLERGIES: Allergies  Allergen Reactions   Metformin And Related Diarrhea    FAMILY HISTORY:  Family History  Problem Relation Age of Onset   Hypertension  Father    Diabetes Mother        DIABETIC COMA   Healthy Daughter     SOCIAL HISTORY: Social History   Socioeconomic History   Marital status: Married    Spouse name: Not on file   Number of children: 1   Years of education: Not on file   Highest education level: 12th grade  Occupational History   Occupation: Chartered certified accountant  Tobacco Use   Smoking status: Never   Smokeless tobacco: Never  Vaping Use   Vaping status: Never Used  Substance and Sexual Activity   Alcohol use: Not Currently   Drug use: Never   Sexual activity: Yes  Other  Topics Concern   Not on file  Social History Narrative   ** Merged History Encounter **       Social Drivers of Health   Financial Resource Strain: Low Risk  (06/03/2023)   Overall Financial Resource Strain (CARDIA)    Difficulty of Paying Living Expenses: Not hard at all  Food Insecurity: No Food Insecurity (06/03/2023)   Hunger Vital Sign    Worried About Running Out of Food in the Last Year: Never true    Ran Out of Food in the Last Year: Never true  Transportation Needs: No Transportation Needs (06/03/2023)   PRAPARE - Administrator, Civil Service (Medical): No    Lack of Transportation (Non-Medical): No  Physical Activity: Insufficiently Active (06/03/2023)   Exercise Vital Sign    Days of Exercise per Week: 3 days    Minutes of Exercise per Session: 20 min  Stress: No Stress Concern Present (06/03/2023)   [employer] of Occupational Health - Occupational Stress Questionnaire    Feeling of Stress : Not at all  Social Connections: Socially Integrated (06/03/2023)   Social Connection and Isolation Panel [NHANES]    Frequency of Communication with Friends and Family: Twice a week    Frequency of Social Gatherings with Friends and Family: Twice a week    Attends Religious Services: More than 4 times per year    Active Member of [employer] or Organizations: Yes    Attends Engineer, structural: More than 4 times per  year    Marital Status: Married    MEDICATIONS:  Current Outpatient Medications  Medication Sig Dispense Refill   Accu-Chek Softclix Lancets lancets USE ACCU CHEK SOFTCLIX LANCETS TO CHECK BLOOD SUGAR FASTING OR 2 HOURS AFTER A MEAL EVERY OTHER DAY. 100 each 3   allopurinol (ZYLOPRIM) 100 MG tablet Take 100 mg by mouth daily.     amLODipine (NORVASC) 10 MG tablet Take 1 tablet by mouth once daily 90 tablet 3   aspirin EC 81 MG tablet Take 81 mg by mouth daily. Swallow whole.     atorvastatin (LIPITOR) 80 MG tablet Take 1 tablet (80 mg total) by mouth daily. 90 tablet 3   Blood Glucose Monitoring Suppl (ACCU-CHEK GUIDE ME) w/Device KIT 1 each by Does not apply route.     carvedilol (COREG) 25 MG tablet Take 1 tablet (25 mg total) by mouth 2 (two) times daily with a meal. 180 tablet 3   cyanocobalamin (VITAMIN B12) 500 MCG tablet Take 500 mcg by mouth daily.     dexamethasone (DECADRON) 0.1 % ophthalmic suspension      empagliflozin (JARDIANCE) 25 MG TABS tablet Take half tablet daily. 45 tablet 3   glucose blood (ACCU-CHEK GUIDE TEST) test strip Check blood sugar 2x daily 100 each 12   glucose blood (ACCU-CHEK GUIDE) test strip USE 1  TO CHECK GLUCOSE ONCE DAILY 100 each 0   Glucose Blood (BLOOD GLUCOSE TEST STRIPS) STRP 1 each by In Vitro route in the morning and at bedtime. May substitute to any manufacturer covered by patient's insurance. 100 each 3   hydrALAZINE (APRESOLINE) 50 MG tablet Take 1 tablet (50 mg total) by mouth 3 (three) times daily. 270 tablet 3   hydroxychloroquine (PLAQUENIL) 200 MG tablet Take 400 mg by mouth daily.     hydroxypropyl methylcellulose / hypromellose (ISOPTO TEARS / GONIOVISC) 2.5 % ophthalmic solution Place 2 drops into the left eye as needed for dry eyes. 15 mL 2  insulin aspart protamine - aspart (NOVOLOG MIX 70/30 FLEXPEN) (70-30) 100 UNIT/ML FlexPen Inject 8 Units into the skin daily with breakfast. Upto 20 units before breakfast as directed 6 mL 4    Insulin Pen Needle 31G X 5 MM MISC Use for pen 100 each 1   isosorbide mononitrate (IMDUR) 30 MG 24 hr tablet Take 1 tablet by mouth once daily 90 tablet 3   potassium chloride SA (KLOR-CON M20) 20 MEQ tablet Take 1 tablet by mouth once daily 30 tablet 11   No current facility-administered medications for this visit.   Facility-Administered Medications Ordered in Other Visits  Medication Dose Route Frequency Provider Last Rate Last Admin   sodium chloride flush (NS) 0.9 % injection 10 mL  10 mL Intravenous PRN Johney Maine, MD       sodium chloride flush (NS) 0.9 % injection 10 mL  10 mL Intracatheter Once PRN Johney Maine, MD        PHYSICAL EXAM: There were no vitals filed for this visit.   There is no height or weight on file to calculate BMI.  Wt Readings from Last 3 Encounters:  08/03/23 196 lb (88.9 kg)  06/07/23 192 lb 1.6 oz (87.1 kg)  06/03/23 192 lb 12.8 oz (87.5 kg)    General: Well developed, well nourished male in no apparent distress.  HEENT: AT/[place], no external lesions.  Eyes: Conjunctiva clear and no icterus. Neurologic: Alert, oriented, normal speech Psychiatric: Does not appear depressed or anxious  Diabetic Foot Exam - Simple   No data filed    Patient does not want to have diabetic foot exam today.  He wants to postpone for next time.  LABS Reviewed Lab Results  Component Value Date   HGBA1C 6.8 (A) 06/03/2023   HGBA1C 6.7 (A) 02/25/2023   HGBA1C 7.1 (A) 12/02/2022   Lab Results  Component Value Date   FRUCTOSAMINE 297 (H) 07/22/2021   FRUCTOSAMINE 347 (H) 04/14/2021   FRUCTOSAMINE 237 11/08/2020   Lab Results  Component Value Date   CHOL 116 06/16/2022   HDL 42 06/16/2022   LDLCALC 51 06/16/2022   LDLDIRECT 50.0 06/08/2022   TRIG 128 06/16/2022   CHOLHDL 2.8 06/16/2022   Lab Results  Component Value Date   MICRALBCREAT 5.2 06/03/2023   MICRALBCREAT 10.0 07/21/2018   Lab Results  Component Value Date   CREATININE 1.46  02/18/2023   Lab Results  Component Value Date   GFR 49.41 (L) 02/18/2023    ASSESSMENT / PLAN  1. Type 2 diabetes mellitus with other specified complication, with long-term current use of insulin (HCC)     Diabetes Mellitus type 2, complicated by CAD/ CKD - Diabetic status / severity: Controlled.  Lab Results  Component Value Date   HGBA1C 6.8 (A) 06/03/2023    - Hemoglobin A1c goal : <7%  - Medications: No change.  I) continue NovoLog Mix 70/30 : 8 units in the morning. II) continue Jardiance 12.5 mg daily.   - Home glucose testing: At least 2 times a day in the morning and at bedtime.  Patient is asked to bring glucometer in the follow-up visit. - Discussed/ Gave Hypoglycemia treatment plan.  # Consult : not required at this time.   # Annual urine for microalbuminuria/ creatinine ratio, no microalbuminuria currently.   Last  Lab Results  Component Value Date   MICRALBCREAT 5.2 06/03/2023    # Foot check nightly / neuropathy.  # Annual dilated diabetic eye exams.   -  Diet: Make healthy diabetic food choices - Life style / activity / exercise: Discussed as tolerated.  2. Blood pressure  -  BP Readings from Last 1 Encounters:  08/03/23 (!) 156/72    - Control is in target.  Blood pressure is not measured today.  This is a virtual visit.  - No change in current plans.  3. Lipid status / Hyperlipidemia - Last  Lab Results  Component Value Date   LDLCALC 51 06/16/2022   - Continue atorvastatin 80 mg daily.  Diagnoses and all orders for this visit:  Type 2 diabetes mellitus with other specified complication, with long-term current use of insulin (HCC) -     BASIC METABOLIC PANEL WITH GFR -     Hemoglobin A1c    DISPOSITION Follow up in clinic in 3 months suggested.  Labs prior to follow-up visit.   All questions answered and patient verbalized understanding of the plan.  [place] Neftali Thurow, MD [facility] Endocrinology [facility] [address] [place], [address] [place], [place] [address] Phone # [phone]  At least part of this note was generated using voice recognition software. Inadvertent word errors may have occurred, which were not recognized during the proofreading process.

## 2023-09-07 NOTE — Telephone Encounter (Signed)
 Following up on pt request pt needed some test strips but needed a override code from Dr Susy Manor with pt explain he received his test strips a couple a days ago from [facility] pharmacy and he is set to go.

## 2023-10-05 ENCOUNTER — Telehealth: Payer: Self-pay

## 2023-10-05 NOTE — Telephone Encounter (Signed)
 Received patient's patient assistance meds. Received Novolog flex pens and novofine 32G tips. Patient called and made aware.

## 2023-10-11 NOTE — Telephone Encounter (Signed)
 Patient came in to office today and picked up 2 boxes of Novolog and 2 boxes of Novofine Pen Needles.  Both were patient assistance.

## 2023-11-03 ENCOUNTER — Ambulatory Visit: Payer: Self-pay

## 2023-11-03 NOTE — Telephone Encounter (Signed)
 Copied from CRM [id number]. Topic: Clinical - Red Word Triage >> Nov 03, 2023 12:45 PM [name] wrote: [name] that prompted transfer to Nurse Triage: LT leg/knee pain, difficulty walking.   Chief Complaint: Left Leg and Knee  Symptoms:Swelling  Frequency: A couple of days  Pertinent Negatives: Patient denies fever, redness,  Disposition: [] ED /[] Urgent Care (no appt availability in office) / [x] Appointment(In office/virtual)/ []  [place] [facility]/ [] Home Care/ [] Refused Recommended Disposition /[] [place] [facility]/ []  Follow-up with PCP Additional Notes: VM is being triaged for left leg and knee pain. The patient states the pain has increased. The patient has been made an in office appointment for tomorrow.   Reason for Disposition  [1] Swollen joint AND [2] no fever or redness  Answer Assessment - Initial Assessment Questions 1. LOCATION and RADIATION: "Where is the pain located?"      Left Knee  2. QUALITY: "What does the pain feel like?"  (e.g., sharp, dull, aching, burning)     Swelling, or Tight sensation  3. SEVERITY: "How bad is the pain?" "What does it keep you from doing?"   (Scale 1-10; or mild, moderate, severe)   -  MILD (1-3): doesn't interfere with normal activities    -  MODERATE (4-7): interferes with normal activities (e.g., work or school) or awakens from sleep, limping    -  SEVERE (8-10): excruciating pain, unable to do any normal activities, unable to walk     None  4. ONSET: "When did the pain start?" "Does it come and go, or is it there all the time?"     A couple of days  5. RECURRENT: "Have you had this pain before?" If Yes, ask: "When, and what happened then?"     No  6. SETTING: "Has there been any recent work, exercise or other activity that involved that part of the body?"      No  7. AGGRAVATING FACTORS: "What makes the knee pain worse?" (e.g., walking, climbing stairs, running)     Walking  8. ASSOCIATED SYMPTOMS: "Is there any  swelling or redness of the knee?"     Swelling, or Tight feeling  9. OTHER SYMPTOMS: "Do you have any other symptoms?" (e.g., chest pain, difficulty breathing, fever, calf pain)     None  Protocols used: Knee Pain-A-AH

## 2023-11-03 NOTE — Telephone Encounter (Signed)
 Patient was scheduled for an appt with Dr Arliss Lam on 4/24.

## 2023-11-04 ENCOUNTER — Ambulatory Visit (INDEPENDENT_AMBULATORY_CARE_PROVIDER_SITE_OTHER): Admitting: Family Medicine

## 2023-11-04 ENCOUNTER — Encounter: Payer: Self-pay | Admitting: Family Medicine

## 2023-11-04 VITALS — BP 142/80 | HR 61 | Temp 97.8°F | Ht 69.0 in | Wt 198.0 lb

## 2023-11-04 DIAGNOSIS — M069 Rheumatoid arthritis, unspecified: Secondary | ICD-10-CM

## 2023-11-04 DIAGNOSIS — M25562 Pain in left knee: Secondary | ICD-10-CM

## 2023-11-04 NOTE — Patient Instructions (Signed)
 You can find Voltaren gel over-the-counter at your local drugstore, [facility], [employer], or online.  Remember that it comes with a dosing card and is not applied like regular topical analgesic products.

## 2023-11-04 NOTE — Progress Notes (Signed)
 Established Patient Office Visit   Subjective  Patient ID: [name], male    DOB: 1955-08-19  Age: 68 y.o. MRN: [id number]  Chief Complaint  Patient presents with   Joint Swelling    Patient came in today for left knee swelling(tightness) that started 4 days ago, patient denies any pain or injury     Patient is a 68 year old male followed with Dr. Ival Marines and seen for acute concern.  Patient with left knee tightness x 4 days.  Patient denies injury, history of gout, erythema, obvious edema, hearing any popping, clicking, tearing.  Patient thinks he may have done more walking/activity at church last week.  Takes ibuprofen as needed.  Symptoms resolved today.    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   S/P TURP 01/06/2021   Pressure injury of skin 10/09/2020   Bladder mass    Failure to thrive in adult 10/07/2020   Flu vaccine need 06/04/2020   Iron  deficiency anemia 06/04/2020   Rheumatoid arthritis involving multiple sites (HCC) 04/17/2020   Aspiration pneumonia (HCC) 03/13/2020   Sepsis (HCC) 03/13/2020   Pulmonary embolism (HCC) 03/13/2020   AKI (acute kidney injury) (HCC) 03/13/2020   Anemia 03/13/2020   Multiple myeloma (HCC) 03/13/2020   Hypertension 03/13/2020   Acute hypoxemic respiratory failure (HCC) 03/13/2020   Multiple myeloma (HCC) 11/06/2019   Counseling regarding advance care planning and goals of care 11/06/2019   Uncontrolled type 2 diabetes mellitus with hyperglycemia (HCC) 09/19/2018   Acute on chronic combined systolic and diastolic CHF (congestive heart failure) (HCC) 11/26/2017   Hyperlipemia 01/07/2011   CAD (coronary artery disease)    Dilated cardiomyopathy (HCC)    HTN (hypertension)     PAD (peripheral artery disease) (HCC)    Chronic renal insufficiency    Past Medical History:  Diagnosis Date   Anemia    Benign localized prostatic hyperplasia with lower urinary tract symptoms (LUTS)    CAD (coronary artery disease)    cardiologist--- dr Swaziland;   11-27-2020 cardiac cath for abnormal cardiolite,  severe 3V disease;   11-30-2000 s/p CABG x4;  cath 04-21-2005 patent SVG-OM1 & SVG --PDA graft's,  occluded radial graft -- OM, mod to sev LV dysfunction,  not candidate for revascularization   Chronic diastolic CHF (congestive heart failure) (HCC)    followed by cardiologist   Chronic kidney disease (CKD), stage IV (severe) (HCC)    nephrologist--- dr Jearldine Mina   (lov note scanned in [place] , epic, dated 09-26-2020)   Dilated cardiomyopathy (HCC)    echo in 2009 showed improvement with a normal EF   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 03-08-2021   (09-03-2021  per pt wife is resolving)   History of COVID-19    spring 2022 with mild symptoms that resolved   History of pulmonary embolism 03/13/2020   complete blood thinner   History of sepsis    hx admission for severe sepsis twice;  admitted 03-13-2020 secondary to aspiration pneumonia w/ ARF and PE;   admitted 10-07-2020 secondary to MSSA uti   History of stroke    per imaging 05-07-2021 head CT ,  remote infarcts left thalamus, basal ganglia   HTN (hypertension)  Hyperlipemia    Ischemic heart disease    cardiologist--- dr Swaziland;   Malignant neoplasm prostate [facility]) 05/2021   urologist-- dr eskridge/  oncologist--- dr Dirk Fredericks;  dx 11/ 2022,  Gleason 9, PSA 14   Multiple myeloma in remission [facility]) 2021   oncologist--- dr Salomon Cree--- dx 2021, IgG Kappa type, completed chemo 2021, in remission since   OA (osteoarthritis)    PAD (peripheral artery disease) (HCC)    RA (rheumatoid arthritis) [facility])    rheumatologist--- dr s. Alvira Josephs---  multiple sites,  no other treatment other than takes tylenol   arthritis with flare-ups   Tympanic membrane central perforation, left    followed by dr bates (ent)  chronic since 08/ 2022   (09-03-2021  per pt wife has partially closed)   Type 2 diabetes mellitus [facility])    endocrinologist--- dr a Hubert Madden   Uses walker    Wears glasses    Past Surgical History:  Procedure Laterality Date   CARDIAC CATHETERIZATION  11/27/2000   @[facility]  by Dr Swaziland;   severe 3V disease,  ef 15-25%   CARDIAC CATHETERIZATION  04/21/2005   by Dr Swaziland;    patnet SVG -- OM1 & SVG --PDA grafts,  occluded radial graft --OM1,  atretic LIMA -- Diag,  moderate to severe LV dysfunction,  not candidate for revascularization   CATARACT EXTRACTION W/ INTRAOCULAR LENS IMPLANT Right 08/21/2021   CORONARY ARTERY BYPASS GRAFT  11/30/2000   @[facility]  by Dr Eleno Griffins;   LIMA -- Diag,  radial artery -- OM1,  SVG -- OM,  SVG -- RCA   GOLD SEED IMPLANT N/A 09/05/2021   Procedure: GOLD SEED IMPLANT;  Surgeon: Christina Coyer, MD;  Location: [facility];  Service: Urology;  Laterality: N/A;  45 MINS FOR CASE   IR FLUORO GUIDED NEEDLE PLC ASPIRATION/INJECTION LOC  10/24/2019   SPACE OAR INSTILLATION N/A 09/05/2021   Procedure: SPACE OAR INSTILLATION;  Surgeon: Christina Coyer, MD;  Location: [facility];  Service: Urology;  Laterality: N/A;   TRANSURETHRAL RESECTION OF PROSTATE N/A 01/06/2021   Procedure: CYSTOSCOPY WITH CYSTOGRAM/TRANSURETHRAL RESECTION OF THE PROSTATE (TURP);  Surgeon: Samson Croak, MD;  Location: [facility];  Service: Urology;  Laterality: N/A;   Social History   Tobacco Use   Smoking status: Never   Smokeless tobacco: Never  Vaping Use   Vaping status: Never Used  Substance Use Topics   Alcohol use: Not Currently   Drug use: Never   Family History  Problem Relation Age of Onset   Hypertension Father    Diabetes Mother        DIABETIC COMA   Healthy Daughter    Allergies  Allergen Reactions   Metformin  And Related Diarrhea       ROS Negative unless stated above    Objective:     BP (!) 142/80 (BP Location: Left Arm, Patient Position: Sitting, Cuff Size: Normal)   Pulse 61   Temp 97.8 F (36.6 C) (Oral)   Ht 5\' 9"  (1.753 m)   Wt 198 lb (89.8 kg)   SpO2 98%   BMI 29.24 kg/m  BP Readings from Last 3 Encounters:  11/04/23 (!) 142/80  08/03/23 (!) 156/72  06/07/23 130/70   Wt Readings from Last 3 Encounters:  11/04/23 198 lb (89.8 kg)  08/03/23 196 lb (88.9 kg)  06/07/23 192 lb 1.6 oz (87.1 kg)      Physical Exam Constitutional:      General: He is  not in acute distress.    Appearance: Normal appearance.  HENT:     Head: Normocephalic and atraumatic.     Nose: Nose normal.     Mouth/Throat:     Mouth: Mucous membranes are moist.  Cardiovascular:     Rate and Rhythm: Normal rate and regular rhythm.     Heart sounds: Normal heart sounds. No murmur heard.    No gallop.  Pulmonary:     Effort: Pulmonary effort is normal. No respiratory distress.     Breath sounds: Normal breath sounds. No wheezing, rhonchi or rales.  Musculoskeletal:     Right knee: Normal.     Left knee: No swelling, deformity, effusion, erythema, bony tenderness or crepitus. Normal range of motion. No tenderness.  Skin:    General: Skin is warm and dry.  Neurological:     Mental Status: He is alert and oriented to person, place, and time.    No results found for any visits on 11/04/23.    Assessment & Plan:  Acute pain of left knee  Rheumatoid arthritis involving multiple sites, unspecified whether rheumatoid factor present [facility])  Patient with acute left knee pain likely from overuse.  Also consider RA flare versus OA.  Symptoms now resolved.  Discussed supportive care including topical analgesic, heat, ice, compression, rest.  Continue to monitor.  Return if symptoms worsen or fail to improve.   Viola Greulich, MD

## 2023-11-19 ENCOUNTER — Telehealth: Payer: Self-pay | Admitting: Cardiology

## 2023-11-19 NOTE — Telephone Encounter (Signed)
 Pt states he has been summoned for jury duty on 11/25/23, he asked if Dr. Swaziland can write him a note to be excused from it. Please advise.

## 2023-11-22 NOTE — Telephone Encounter (Signed)
 Spoke to patient jury duty excuse letter left at 5th floor [address] front desk.

## 2023-11-24 ENCOUNTER — Other Ambulatory Visit: Payer: Self-pay | Admitting: Cardiology

## 2023-11-27 ENCOUNTER — Other Ambulatory Visit (HOSPITAL_COMMUNITY): Payer: Self-pay

## 2023-11-29 ENCOUNTER — Other Ambulatory Visit: Payer: Self-pay

## 2023-11-29 DIAGNOSIS — E1165 Type 2 diabetes mellitus with hyperglycemia: Secondary | ICD-10-CM

## 2023-11-29 MED ORDER — ACCU-CHEK SOFTCLIX LANCETS MISC: 3 refills | Status: AC

## 2024-01-06 ENCOUNTER — Other Ambulatory Visit: Payer: Self-pay

## 2024-01-25 ENCOUNTER — Telehealth: Payer: Self-pay

## 2024-01-25 NOTE — Telephone Encounter (Signed)
 Patient aware that Novolog  mis and pen needles have arrived from [employer] and can be picked up.  Novolog  Mix 2 boxes  Novofine 2 boxes

## 2024-01-30 ENCOUNTER — Other Ambulatory Visit: Payer: Self-pay | Admitting: Cardiology

## 2024-01-31 NOTE — Telephone Encounter (Signed)
 Patient came in to office today and picked up 2 boxes of Novolog  and 2 boxes of NovoFine pen needles.  Both were patient assistance.

## 2024-02-22 ENCOUNTER — Other Ambulatory Visit (HOSPITAL_COMMUNITY): Payer: Self-pay

## 2024-02-22 ENCOUNTER — Other Ambulatory Visit (HOSPITAL_BASED_OUTPATIENT_CLINIC_OR_DEPARTMENT_OTHER): Payer: Self-pay

## 2024-02-22 MED FILL — Amlodipine Besylate Tab 10 MG (Base Equivalent): ORAL | 90 days supply | Qty: 90 | Fill #0 | Status: AC

## 2024-02-25 ENCOUNTER — Telehealth: Payer: Self-pay | Admitting: *Deleted

## 2024-02-25 NOTE — Telephone Encounter (Unsigned)
 Copied from CRM #[id number]. Topic: General - Other >> Feb 25, 2024 11:54 AM Rosina BIRCH wrote: Reason for CRM: patient called stating the jardiance medication is  to high for him and he want to get something cheaper [phone]

## 2024-03-03 ENCOUNTER — Other Ambulatory Visit (HOSPITAL_COMMUNITY): Payer: Self-pay

## 2024-03-06 ENCOUNTER — Other Ambulatory Visit (HOSPITAL_COMMUNITY): Payer: Self-pay

## 2024-04-11 ENCOUNTER — Telehealth: Payer: Self-pay

## 2024-04-11 ENCOUNTER — Other Ambulatory Visit: Payer: Self-pay

## 2024-04-11 ENCOUNTER — Other Ambulatory Visit (HOSPITAL_COMMUNITY): Payer: Self-pay

## 2024-04-11 NOTE — Telephone Encounter (Signed)
 Received a refill reorder for Novo Nordisk Pen needles ,fill and faxed to provider office to sign and date can be fax to [employer] or fax back to [phone].

## 2024-05-01 ENCOUNTER — Telehealth: Payer: Self-pay

## 2024-05-01 NOTE — Telephone Encounter (Signed)
 Received patient's patient assistance meds. Received Novolog  70/30 x 2. Patient called and made aware.

## 2024-05-02 ENCOUNTER — Telehealth: Payer: Self-pay

## 2024-05-02 NOTE — Telephone Encounter (Signed)
 Received patient's patient assistance meds. Received Novofine pen needles x 1 box Patient called and made aware.

## 2024-05-10 ENCOUNTER — Other Ambulatory Visit: Payer: Self-pay

## 2024-05-15 NOTE — Telephone Encounter (Signed)
 Patient came in to office today and picked up 1 box of patient assistance NovoFine Pen Needles.

## 2024-05-16 ENCOUNTER — Ambulatory Visit: Admitting: Family Medicine

## 2024-05-25 ENCOUNTER — Other Ambulatory Visit (HOSPITAL_COMMUNITY): Payer: Self-pay

## 2024-05-26 MED FILL — Amlodipine Besylate Tab 10 MG (Base Equivalent): ORAL | 90 days supply | Qty: 90 | Fill #1 | Status: AC

## 2024-05-27 ENCOUNTER — Other Ambulatory Visit (HOSPITAL_COMMUNITY): Payer: Self-pay

## 2024-06-21 ENCOUNTER — Other Ambulatory Visit: Payer: Self-pay | Admitting: Endocrinology

## 2024-06-22 NOTE — Telephone Encounter (Signed)
 Refilled at [employer]. Patient sent message requesting he reach out to PCP going forward.

## 2024-07-22 ENCOUNTER — Other Ambulatory Visit: Payer: Self-pay | Admitting: Endocrinology

## 2024-07-24 ENCOUNTER — Other Ambulatory Visit: Payer: Self-pay | Admitting: Cardiology

## 2024-08-04 ENCOUNTER — Other Ambulatory Visit: Payer: Self-pay | Admitting: Endocrinology

## 2024-08-04 ENCOUNTER — Other Ambulatory Visit (HOSPITAL_COMMUNITY): Payer: Self-pay

## 2024-08-04 DIAGNOSIS — E1169 Type 2 diabetes mellitus with other specified complication: Secondary | ICD-10-CM

## 2024-08-04 MED ORDER — EMPAGLIFLOZIN 25 MG PO TABS
ORAL_TABLET | ORAL | 3 refills | Status: AC
  Filled 2024-08-04: qty 45, 90d supply, fill #0
  Filled 2024-08-17: qty 15, 30d supply, fill #0

## 2024-08-09 ENCOUNTER — Telehealth: Payer: Self-pay

## 2024-08-09 NOTE — Telephone Encounter (Signed)
 Received patient's patient assistance meds. Received Novolog  70/30 x 2. Patient called and made aware.

## 2024-08-11 ENCOUNTER — Other Ambulatory Visit (HOSPITAL_COMMUNITY): Payer: Self-pay

## 2024-08-11 ENCOUNTER — Telehealth: Payer: Self-pay

## 2024-08-15 ENCOUNTER — Other Ambulatory Visit (HOSPITAL_COMMUNITY): Payer: Self-pay

## 2024-08-17 ENCOUNTER — Encounter: Payer: Self-pay | Admitting: Hematology

## 2024-08-17 ENCOUNTER — Other Ambulatory Visit (HOSPITAL_COMMUNITY): Payer: Self-pay

## 2024-08-17 ENCOUNTER — Other Ambulatory Visit: Payer: Self-pay

## 2024-08-25 ENCOUNTER — Ambulatory Visit
